# Patient Record
Sex: Female | Born: 1975 | Race: White | Hispanic: No | Marital: Married | Smoking: Never smoker
Health system: Southern US, Community
[De-identification: ages and names within clinical notes are randomized; demographics above are authoritative.]

## PROBLEM LIST (undated history)

## (undated) DIAGNOSIS — Z8632 Personal history of gestational diabetes: Secondary | ICD-10-CM

## (undated) DIAGNOSIS — E119 Type 2 diabetes mellitus without complications: Secondary | ICD-10-CM

## (undated) DIAGNOSIS — Z9221 Personal history of antineoplastic chemotherapy: Secondary | ICD-10-CM

## (undated) DIAGNOSIS — D649 Anemia, unspecified: Secondary | ICD-10-CM

## (undated) DIAGNOSIS — Z923 Personal history of irradiation: Secondary | ICD-10-CM

## (undated) DIAGNOSIS — G709 Myoneural disorder, unspecified: Secondary | ICD-10-CM

## (undated) DIAGNOSIS — Z8489 Family history of other specified conditions: Secondary | ICD-10-CM

## (undated) HISTORY — PX: WISDOM TOOTH EXTRACTION: SHX21

## (undated) HISTORY — DX: Type 2 diabetes mellitus without complications: E11.9

## (undated) HISTORY — DX: Personal history of gestational diabetes: Z86.32

---

## 2001-06-07 HISTORY — PX: DILATION AND CURETTAGE OF UTERUS: SHX78

## 2003-07-07 DIAGNOSIS — O24419 Gestational diabetes mellitus in pregnancy, unspecified control: Secondary | ICD-10-CM

## 2006-05-11 ENCOUNTER — Ambulatory Visit: Payer: Self-pay

## 2006-08-17 ENCOUNTER — Ambulatory Visit: Payer: Self-pay

## 2006-08-23 ENCOUNTER — Ambulatory Visit: Payer: Self-pay

## 2006-09-15 ENCOUNTER — Encounter: Payer: Self-pay | Admitting: Maternal & Fetal Medicine

## 2006-10-09 ENCOUNTER — Inpatient Hospital Stay: Payer: Self-pay

## 2006-10-09 DIAGNOSIS — O24415 Gestational diabetes mellitus in pregnancy, controlled by oral hypoglycemic drugs: Secondary | ICD-10-CM

## 2017-09-26 ENCOUNTER — Encounter: Payer: Self-pay | Admitting: Certified Nurse Midwife

## 2017-09-26 ENCOUNTER — Ambulatory Visit (INDEPENDENT_AMBULATORY_CARE_PROVIDER_SITE_OTHER): Payer: BLUE CROSS/BLUE SHIELD | Admitting: Certified Nurse Midwife

## 2017-09-26 VITALS — BP 104/72 | HR 88 | Ht 66.0 in | Wt 235.0 lb

## 2017-09-26 DIAGNOSIS — Z124 Encounter for screening for malignant neoplasm of cervix: Secondary | ICD-10-CM

## 2017-09-26 DIAGNOSIS — N920 Excessive and frequent menstruation with regular cycle: Secondary | ICD-10-CM | POA: Diagnosis not present

## 2017-09-26 DIAGNOSIS — Z13220 Encounter for screening for lipoid disorders: Secondary | ICD-10-CM

## 2017-09-26 DIAGNOSIS — Z01419 Encounter for gynecological examination (general) (routine) without abnormal findings: Secondary | ICD-10-CM | POA: Diagnosis not present

## 2017-09-26 DIAGNOSIS — Z6837 Body mass index (BMI) 37.0-37.9, adult: Secondary | ICD-10-CM | POA: Diagnosis not present

## 2017-09-26 DIAGNOSIS — Z131 Encounter for screening for diabetes mellitus: Secondary | ICD-10-CM

## 2017-09-26 DIAGNOSIS — N898 Other specified noninflammatory disorders of vagina: Secondary | ICD-10-CM | POA: Diagnosis not present

## 2017-09-26 DIAGNOSIS — Z1239 Encounter for other screening for malignant neoplasm of breast: Secondary | ICD-10-CM

## 2017-09-26 DIAGNOSIS — Z8632 Personal history of gestational diabetes: Secondary | ICD-10-CM | POA: Diagnosis not present

## 2017-09-26 DIAGNOSIS — Z1231 Encounter for screening mammogram for malignant neoplasm of breast: Secondary | ICD-10-CM

## 2017-09-26 NOTE — Progress Notes (Signed)
Gynecology Annual Exam  PCP: Patient, No Pcp Per  Chief Complaint:  Chief Complaint  Patient presents with  . Gynecologic Exam    History of Present Illness:Marilyn Page is a 42 year old Caucasian/White female, [place], who presents for her routine gyn exam. She is having concerns with 1) vaginal discharge that is clear with a possible odor and no itching, 2) one heavy day of bleeding with her menses, at times requiring pad and tampon change every 1 hour Her menses are regular and her LMP was 4/15-4/21. They occur every month and last 5-7 days.  She has had no spotting.  She reports dysmenorrhea, and Advil x 1 dose usually helps. The patient's past medical history is notable for a history of PCOS, gestational diabetes, and obesity.  Since her last annual GYN exam dated 05/15/2014, she complains of difficulty losing weight.    She is sexually active. She is currently using a vasectomy for contraception.  Her most recent pap smear was obtained 12/28/2013 and was negative.  Her most recent mammogram obtained on 11/15/2011 was benign and was her baseline mammogram..  There is no family history of breast cancer.  There is a family history of ovarian cancer in her cousin. Genetic testing has not been done.  The patient does do occasional self breast exams.  The patient does not smoke.  The patient does not drink alcohol.  The patient does not use illegal drugs.  The patient exercises occasionally.  The patient does get adequate calcium in her diet.  She has not had a recent cholesterol screen and is interested in labwork.    Review of Systems: Review of Systems  Constitutional: Negative for chills, fever and weight loss.  HENT: Positive for congestion (nasal congestion). Negative for sinus pain and sore throat.   Eyes: Positive for blurred vision (change in vision). Negative for pain.  Respiratory: Positive for cough (and allergies). Negative for hemoptysis, shortness of breath  and wheezing.   Cardiovascular: Negative for chest pain, palpitations and leg swelling.  Gastrointestinal: Negative for abdominal pain, blood in stool, diarrhea, heartburn, nausea and vomiting.  Genitourinary: Negative for dysuria, frequency, hematuria and urgency.       Positive for vaginal discharge with odor  Musculoskeletal: Negative for back pain, joint pain and myalgias.  Skin: Negative for itching and rash.  Neurological: Positive for headaches. Negative for dizziness and tingling.  Endo/Heme/Allergies: Negative for environmental allergies and polydipsia. Does not bruise/bleed easily.       Negative for hirsutism   Psychiatric/Behavioral: Negative for depression. The patient is not nervous/anxious and does not have insomnia.     Past Medical History:  Past Medical History:  Diagnosis Date  . History of gestational diabetes     Past Surgical History:  Past Surgical History:  Procedure Laterality Date  . CESAREAN SECTION  2005/2008  . DILATION AND CURETTAGE OF UTERUS  2003    Family History:  Family History  Problem Relation Age of Onset  . Ovarian cysts Mother   . Migraines Mother   . Hyperlipidemia Father   . Diabetes Maternal Aunt   . Diabetes Maternal Grandfather   . Ovarian cancer Cousin 20       maternal 1st cousin-now in her 77s    Social History:  Social History   Socioeconomic History  . Marital status: Married    Spouse name: Not on file  . Number of children: 2  . Years of education: Not  on file  . Highest education level: Not on file  Occupational History  . Not on file  Social Needs  . Financial resource strain: Not on file  . Food insecurity:    Worry: Not on file    Inability: Not on file  . Transportation needs:    Medical: Not on file    Non-medical: Not on file  Tobacco Use  . Smoking status: Never Smoker  . Smokeless tobacco: Never Used  Substance and Sexual Activity  . Alcohol use: Never    Frequency: Never  . Drug use: Never    . Sexual activity: Yes    Birth control/protection: Other-see comments    Comment: vasectomy  Lifestyle  . Physical activity:    Days per week: 0 days    Minutes per session: 0 min  . Stress: Not on file  Relationships  . Social connections:    Talks on phone: Not on file    Gets together: Not on file    Attends religious service: Not on file    Active member of club or organization: Not on file    Attends meetings of clubs or organizations: Not on file    Relationship status: Not on file  . Intimate partner violence:    Fear of current or ex partner: Not on file    Emotionally abused: Not on file    Physically abused: Not on file    Forced sexual activity: Not on file  Other Topics Concern  . Not on file  Social History Narrative  . Not on file    Allergies:  No Known Allergies  Medications: none Physical Exam Vitals: BP 104/72   Pulse 88   Ht 5\' 6"  (1.676 m)   Wt 235 lb (106.6 kg)   LMP 09/19/2017 (Exact Date)   BMI 37.93 kg/m   General: NAD HEENT: normocephalic, anicteric Neck: no thyroid enlargement, no palpable nodules, no cervical lymphadenopathy  Pulmonary: No increased work of breathing, CTAB Cardiovascular: RRR, without murmur  Breast: Breast symmetrical, no tenderness, no palpable nodules or masses, no skin or nipple retraction present, no nipple discharge.  No axillary, infraclavicular or supraclavicular lymphadenopathy. Abdomen: Soft, non-tender, non-distended.  Umbilicus without lesions.  No hepatomegaly or masses palpable. No evidence of hernia. Genitourinary:  External: Normal external female genitalia.  Normal urethral meatus, normal Bartholin's and Skene's glands.    Vagina: Normal vaginal mucosa, no evidence of prolapse.    Cervix: Grossly normal in appearance, no bleeding, non-tender  Uterus: Anteverted, slightly irregular contour on right fundal area, mobile, and non-tender  Adnexa: No adnexal masses, non-tender  Rectal: deferred  Lymphatic:  no evidence of inguinal lymphadenopathy Extremities: no edema, erythema, or tenderness Neurologic: Grossly intact Psychiatric: mood appropriate, affect full   Wet prep: negative for hyphae, Trich, clue cells and amine odor.  Assessment: 42 y.o.  Well woman exam No vaginal infection Desires weight loss. BMI=37.93 kg/m2 Irregularly shaped uterus with heavier bleeding x 1 day  Plan:    1) Breast cancer screening - recommend monthly self breast exam and annual mammograms. Mammogram was ordered today.  2) Discussed weight loss-recommend following low glycemic index diet and encouraged increased exercise. Offered appointment with MD to discuss medical treatment for weight loss.  3) Cervical cancer screening - Pap was done.  4) Routine healthcare maintenance labs including cholesterol and diabetes screening, TSH, CBC, and CMP ordered today  5) Pelvic ultrasound and folow up with me.  Dalia Heading, CNM

## 2017-09-27 LAB — HEMOGLOBIN A1C
ESTIMATED AVERAGE GLUCOSE: 123 mg/dL
Hgb A1c MFr Bld: 5.9 % — ABNORMAL HIGH (ref 4.8–5.6)

## 2017-09-27 LAB — CBC WITH DIFFERENTIAL/PLATELET
BASOS: 1 %
Basophils Absolute: 0.1 10*3/uL (ref 0.0–0.2)
EOS (ABSOLUTE): 0.4 10*3/uL (ref 0.0–0.4)
EOS: 4 %
HEMATOCRIT: 37.7 % (ref 34.0–46.6)
HEMOGLOBIN: 12.4 g/dL (ref 11.1–15.9)
Immature Grans (Abs): 0 10*3/uL (ref 0.0–0.1)
Immature Granulocytes: 0 %
LYMPHS ABS: 2.2 10*3/uL (ref 0.7–3.1)
Lymphs: 26 %
MCH: 27.9 pg (ref 26.6–33.0)
MCHC: 32.9 g/dL (ref 31.5–35.7)
MCV: 85 fL (ref 79–97)
MONOCYTES: 8 %
Monocytes Absolute: 0.6 10*3/uL (ref 0.1–0.9)
NEUTROS ABS: 5.3 10*3/uL (ref 1.4–7.0)
Neutrophils: 61 %
Platelets: 280 10*3/uL (ref 150–379)
RBC: 4.45 x10E6/uL (ref 3.77–5.28)
RDW: 14 % (ref 12.3–15.4)
WBC: 8.6 10*3/uL (ref 3.4–10.8)

## 2017-09-27 LAB — COMPREHENSIVE METABOLIC PANEL
A/G RATIO: 1.3 (ref 1.2–2.2)
ALT: 23 IU/L (ref 0–32)
AST: 18 IU/L (ref 0–40)
Albumin: 3.9 g/dL (ref 3.5–5.5)
Alkaline Phosphatase: 79 IU/L (ref 39–117)
BUN/Creatinine Ratio: 12 (ref 9–23)
BUN: 9 mg/dL (ref 6–24)
CHLORIDE: 102 mmol/L (ref 96–106)
CO2: 24 mmol/L (ref 20–29)
Calcium: 9.2 mg/dL (ref 8.7–10.2)
Creatinine, Ser: 0.74 mg/dL (ref 0.57–1.00)
GFR calc non Af Amer: 100 mL/min/{1.73_m2} (ref >59)
GFR, EST AFRICAN AMERICAN: 116 mL/min/{1.73_m2} (ref >59)
Globulin, Total: 2.9 g/dL (ref 1.5–4.5)
Glucose: 89 mg/dL (ref 65–99)
POTASSIUM: 4.1 mmol/L (ref 3.5–5.2)
Sodium: 143 mmol/L (ref 134–144)
TOTAL PROTEIN: 6.8 g/dL (ref 6.0–8.5)

## 2017-09-27 LAB — LIPID PANEL
CHOLESTEROL TOTAL: 145 mg/dL (ref 100–199)
Chol/HDL Ratio: 5.2 ratio — ABNORMAL HIGH (ref 0.0–4.4)
HDL: 28 mg/dL — ABNORMAL LOW (ref >39)
LDL CALC: 88 mg/dL (ref 0–99)
Triglycerides: 143 mg/dL (ref 0–149)
VLDL CHOLESTEROL CAL: 29 mg/dL (ref 5–40)

## 2017-09-27 LAB — TSH: TSH: 1.74 u[IU]/mL (ref 0.450–4.500)

## 2017-09-28 LAB — IGP, APTIMA HPV
HPV Aptima: NEGATIVE
PAP SMEAR COMMENT: 0

## 2017-10-03 ENCOUNTER — Telehealth: Payer: Self-pay | Admitting: Certified Nurse Midwife

## 2017-10-03 ENCOUNTER — Encounter: Payer: Self-pay | Admitting: Certified Nurse Midwife

## 2017-10-03 DIAGNOSIS — Z6837 Body mass index (BMI) 37.0-37.9, adult: Secondary | ICD-10-CM | POA: Insufficient documentation

## 2017-10-03 DIAGNOSIS — Z8632 Personal history of gestational diabetes: Secondary | ICD-10-CM | POA: Insufficient documentation

## 2017-10-03 LAB — POCT WET PREP (WET MOUNT): Trichomonas Wet Prep HPF POC: ABSENT

## 2017-10-03 NOTE — Telephone Encounter (Signed)
Called patient with lab results and Pap smear results: CMP, CBC, and TSH all normal. Low HDL noted on otherwise normal lipid panel. Hemoglobin A1C was 5.9%. Pap was ASCUS with negative HRHPV. Encouraged to continue with plans to exercise and lose weight. Repeat Pap smear in 1 year. Repeat hemoglobin A1C and lipid panel in 6-12 months. Dalia Heading, CNM

## 2017-10-14 ENCOUNTER — Ambulatory Visit (INDEPENDENT_AMBULATORY_CARE_PROVIDER_SITE_OTHER): Payer: BLUE CROSS/BLUE SHIELD

## 2017-10-14 ENCOUNTER — Ambulatory Visit (INDEPENDENT_AMBULATORY_CARE_PROVIDER_SITE_OTHER): Payer: BLUE CROSS/BLUE SHIELD | Admitting: Certified Nurse Midwife

## 2017-10-14 ENCOUNTER — Encounter: Payer: Self-pay | Admitting: Certified Nurse Midwife

## 2017-10-14 VITALS — BP 102/72 | HR 71 | Ht 66.0 in | Wt 236.0 lb

## 2017-10-14 DIAGNOSIS — D259 Leiomyoma of uterus, unspecified: Secondary | ICD-10-CM | POA: Diagnosis not present

## 2017-10-14 DIAGNOSIS — N920 Excessive and frequent menstruation with regular cycle: Secondary | ICD-10-CM

## 2017-10-14 NOTE — Patient Instructions (Signed)
Uterine Fibroids Uterine fibroids are tissue masses (tumors) that can develop in the womb (uterus). They are also called leiomyomas. This type of tumor is not cancerous (benign) and does not spread to other parts of the body outside of the pelvic area, which is between the hip bones. Occasionally, fibroids may develop in the fallopian tubes, in the cervix, or on the support structures (ligaments) that surround the uterus. You can have one or many fibroids. Fibroids can vary in size, weight, and where they grow in the uterus. Some can become quite large. Most fibroids do not require medical treatment. What are the causes? A fibroid can develop when a single uterine cell keeps growing (replicating). Most cells in the human body have a control mechanism that keeps them from replicating without control. What are the signs or symptoms? Symptoms may include:  Heavy bleeding during your period.  Bleeding or spotting between periods.  Pelvic pain and pressure.  Bladder problems, such as needing to urinate more often (urinary frequency) or urgently.  Inability to reproduce offspring (infertility).  Miscarriages.  How is this diagnosed? Uterine fibroids are diagnosed through a physical exam. Your health care provider may feel the lumpy tumors during a pelvic exam. Ultrasonography and an MRI may be done to determine the size, location, and number of fibroids. How is this treated? Treatment may include:  Watchful waiting. This involves getting the fibroid checked by your health care provider to see if it grows or shrinks. Follow your health care provider's recommendations for how often to have this checked.  Hormone medicines. These can be taken by mouth or given through an intrauterine device (IUD).  Surgery. ? Removing the fibroids (myomectomy) or the uterus (hysterectomy). ? Removing blood supply to the fibroids (uterine artery embolization).  If fibroids interfere with your fertility and you  want to become pregnant, your health care provider may recommend having the fibroids removed. Follow these instructions at home:  Keep all follow-up visits as directed by your health care provider. This is important.  Take over-the-counter and prescription medicines only as told by your health care provider. ? If you were prescribed a hormone treatment, take the hormone medicines exactly as directed.  Ask your health care provider about taking iron pills and increasing the amount of dark green, leafy vegetables in your diet. These actions can help to boost your blood iron levels, which may be affected by heavy menstrual bleeding.  Pay close attention to your period and tell your health care provider about any changes, such as: ? Increased blood flow that requires you to use more pads or tampons than usual per month. ? A change in the number of days that your period lasts per month. ? A change in symptoms that are associated with your period, such as abdominal cramping or back pain. Contact a health care provider if:  You have pelvic pain, back pain, or abdominal cramps that cannot be controlled with medicines.  You have an increase in bleeding between and during periods.  You soak tampons or pads in a half hour or less.  You feel lightheaded, extra tired, or weak. Get help right away if:  You faint.  You have a sudden increase in pelvic pain. This information is not intended to replace advice given to you by your health care provider. Make sure you discuss any questions you have with your health care provider. Document Released: 05/21/2000 Document Revised: 01/22/2016 Document Reviewed: 11/20/2013 Elsevier Interactive Patient Education  2018 [employer]  

## 2017-10-14 NOTE — Progress Notes (Signed)
  Marilyn Page is a 42 year old Caucasian/White female, [place], who presents for a follow up visit after a pelvic ultrasound. At her recent annual exam she had an irregularly shaped uterus and expressed concerns regarding her heavy menstrual flow. She has one heavy day of bleeding with her menses, at times requiring pad and tampon change every 1 hour Her menses are regular and her LMP was 4/15-4/21. They occur every month and last 5-7 days.     Ultrasound demonstrates a hyperechoic area on the posterior wall measuring 3 x 3cm which appears to be a fibroid and there is a question as to whether it encroaches on the endometrium (pending physician's reading). The uterus measures 11.75 x 6.3 x 6,68 cm. There is a 3 cm simple cyst on the right ovary and the endometrium is 16.77mm (menses due next week) PMHx: She  has a past medical history of History of gestational diabetes. Also,  has a past surgical history that includes Cesarean section (2005/2008) and Dilation and curettage of uterus (2003)., family history includes Diabetes in her maternal aunt and maternal grandfather; Hyperlipidemia in her father; Migraines in her mother; Ovarian cancer (age of onset: 59) in her cousin; Ovarian cysts in her mother.,  reports that she has never smoked. She has never used smokeless tobacco. She reports that she does not drink alcohol or use drugs.  She currently has no medications in their medication list. Also, has No Known Allergies.  ROS  Objective: BP 102/72   Pulse 71   Wt 236 lb (107 kg)   LMP 09/19/2017 (Exact Date)   BMI 38.09 kg/m   Physical examination Constitutional NAD, Conversant      Extremities: Moves all appropriately.  Normal ROM for age  Neuro: Grossly intact  Psych: Oriented to PPT.  Normal mood. Normal affect.   Assessment:  Uterine leiomyoma, unspecified location   PLan: Discussed findings and limitations of ultrasound. Explained that usually fibroids are benign, but that  fibroids can cause increased bleeding if they are submucosal. If desires further evaluation/ treatment, would recommend a SIS and follow up with MD Patient to discuss with husband and let me know if she decides to evaluate further.  Written information on fibroids given to her.  Dalia Heading, CNM

## 2017-10-19 NOTE — Progress Notes (Signed)
This is the patient that probably has a fibroid rather than "adenomyosis" per Dr Georgianne Fick.

## 2017-11-09 ENCOUNTER — Ambulatory Visit
Admission: RE | Admit: 2017-11-09 | Discharge: 2017-11-09 | Disposition: A | Discharge status: Home / Self Care | Payer: BLUE CROSS/BLUE SHIELD | Source: Ambulatory Visit | Attending: Certified Nurse Midwife | Admitting: Certified Nurse Midwife

## 2017-11-09 DIAGNOSIS — Z1231 Encounter for screening mammogram for malignant neoplasm of breast: Secondary | ICD-10-CM | POA: Insufficient documentation

## 2017-11-09 DIAGNOSIS — Z1239 Encounter for other screening for malignant neoplasm of breast: Secondary | ICD-10-CM

## 2017-11-18 ENCOUNTER — Other Ambulatory Visit: Payer: Self-pay | Admitting: *Deleted

## 2017-11-18 ENCOUNTER — Inpatient Hospital Stay
Admission: RE | Admit: 2017-11-18 | Discharge: 2017-11-18 | Disposition: A | Discharge status: Home / Self Care | Payer: Self-pay | Source: Ambulatory Visit | Attending: *Deleted | Admitting: *Deleted

## 2017-11-18 DIAGNOSIS — Z9289 Personal history of other medical treatment: Secondary | ICD-10-CM

## 2018-05-07 ENCOUNTER — Ambulatory Visit
Admission: EM | Admit: 2018-05-07 | Discharge: 2018-05-07 | Disposition: A | Discharge status: Home / Self Care | Payer: BLUE CROSS/BLUE SHIELD | Attending: Family Medicine | Admitting: Family Medicine

## 2018-05-07 ENCOUNTER — Other Ambulatory Visit: Payer: Self-pay

## 2018-05-07 DIAGNOSIS — J069 Acute upper respiratory infection, unspecified: Secondary | ICD-10-CM

## 2018-05-07 LAB — RAPID STREP SCREEN (MED CTR MEBANE ONLY): Streptococcus, Group A Screen (Direct): NEGATIVE

## 2018-05-07 MED ORDER — IPRATROPIUM BROMIDE 0.06 % NA SOLN: 2.0000 | Freq: Four times a day (QID) | NASAL | 0 refills | Status: DC | PRN

## 2018-05-07 MED ORDER — CETIRIZINE-PSEUDOEPHEDRINE ER 5-120 MG PO TB12: 1.0000 | ORAL_TABLET | Freq: Two times a day (BID) | ORAL | 0 refills | Status: DC

## 2018-05-07 NOTE — ED Provider Notes (Signed)
[facility]    CSN: [id number] Arrival date & time: 05/07/18  1407  History   Chief Complaint Chief Complaint  Patient presents with  . Nasal Congestion  . Sore Throat   HPI  42 year old female presents with upper respiratory symptoms.  Symptoms started today.  Patient states that she awoke with congestion and severe sore throat.  Her pain is currently 5/10 in severity.  She has taken Tylenol with no relief.  No other medication or interventions tried.  No known exacerbating or relieving factors.  No fever. No reported sick contacts.  No other associated symptoms.  No other complaints.   PMH, Surgical Hx, Family Hx, Social History reviewed and updated as below.  Past Medical History:  Diagnosis Date  . History of gestational diabetes    Patient Active Problem List   Diagnosis Date Noted  . Menorrhagia with regular cycle 10/14/2017  . BMI 37.0-37.9, adult 10/03/2017  . History of gestational diabetes 10/03/2017   Past Surgical History:  Procedure Laterality Date  . CESAREAN SECTION  2005/2008  . DILATION AND CURETTAGE OF UTERUS  2003    OB History    Gravida  3   Para  2   Term  2   Preterm      AB  1   Living  2     SAB  1   TAB      Ectopic      Multiple      Live Births  2            Home Medications    Prior to Admission medications   Medication Sig Start Date End Date Taking? Authorizing Provider  cetirizine-pseudoephedrine (ZYRTEC-D) 5-120 MG tablet Take 1 tablet by mouth 2 (two) times daily. 05/07/18   Thersa Salt G, DO  ipratropium (ATROVENT) 0.06 % nasal spray Place 2 sprays into both nostrils 4 (four) times daily as needed for rhinitis. 05/07/18   Coral Spikes, DO    Family History Family History  Problem Relation Age of Onset  . Ovarian cysts Mother   . Migraines Mother   . Hyperlipidemia Father   . Diabetes Maternal Aunt   . Diabetes Maternal Grandfather   . Ovarian cancer Cousin 20       maternal 1st cousin-now  in her 44s  . Breast cancer Neg Hx     Social History Social History   Tobacco Use  . Smoking status: Never Smoker  . Smokeless tobacco: Never Used  Substance Use Topics  . Alcohol use: Never    Frequency: Never  . Drug use: Never    Allergies   Patient has no known allergies.   Review of Systems Review of Systems  Constitutional: Negative for fever.  HENT: Positive for congestion and sore throat.    Physical Exam Triage Vital Signs ED Triage Vitals [05/07/18 1445]  Enc Vitals Group     BP (!) 143/68     Pulse Rate 91     Resp 16     Temp 97.8 F (36.6 C)     Temp Source Oral     SpO2 100 %     Weight 236 lb (107 kg)     Height 5\' 6"  (1.676 m)     Head Circumference      Peak Flow      Pain Score 5     Pain Loc      Pain Edu?      Excl. in  GC?    No data found.  Updated Vital Signs BP (!) 143/68 (BP Location: Left Arm)   Pulse 91   Temp 97.8 F (36.6 C) (Oral)   Resp 16   Ht 5\' 6"  (1.676 m)   Wt 107 kg   LMP 04/30/2018   SpO2 100%   BMI 38.09 kg/m   Visual Acuity Right Eye Distance:   Left Eye Distance:   Bilateral Distance:    Right Eye Near:   Left Eye Near:    Bilateral Near:     Physical Exam  Constitutional: She is oriented to person, place, and time. She appears well-developed. No distress.  HENT:  Head: Normocephalic and atraumatic.  Oropharynx with erythema.   TMs with scarring but otherwise normal.  Eyes: Conjunctivae are normal.  Cardiovascular: Normal rate and regular rhythm.  Pulmonary/Chest: Effort normal and breath sounds normal. She has no wheezes. She has no rales.  Neurological: She is alert and oriented to person, place, and time.  Psychiatric: She has a normal mood and affect. Her behavior is normal.  Nursing note and vitals reviewed.  UC Treatments / Results  Labs (all labs ordered are listed, but only abnormal results are displayed) Labs Reviewed  RAPID STREP SCREEN ([facility] ONLY)  CULTURE, GROUP A  STREP [facility])    EKG None  Radiology No results found.  Procedures Procedures (including critical care time)  Medications Ordered in UC Medications - No data to display  Initial Impression / Assessment and Plan / UC Course  I have reviewed the triage vital signs and the nursing notes.  Pertinent labs & imaging results that were available during my care of the patient were reviewed by me and considered in my medical decision making (see chart for details).    42 year old female presents with a viral URI.  Strep negative.  Treating with Atrovent nasal spray, Zyrtec-D.  Supportive care.  Final Clinical Impressions(s) / UC Diagnoses   Final diagnoses:  Upper respiratory infection with cough and congestion     Discharge Instructions     Rest. Fluids  Ibuprofen 800 mg every 8 hours as needed for pain.  Medication as prescribed.   Take care  Dr. Lacinda Axon    ED Prescriptions    Medication Sig Dispense Auth. Provider   ipratropium (ATROVENT) 0.06 % nasal spray Place 2 sprays into both nostrils 4 (four) times daily as needed for rhinitis. 15 mL Seba Madole G, DO   cetirizine-pseudoephedrine (ZYRTEC-D) 5-120 MG tablet Take 1 tablet by mouth 2 (two) times daily. 30 tablet Coral Spikes, DO     Controlled Substance Prescriptions [place] Controlled Substance Registry consulted? Not Applicable   Coral Spikes, DO 05/07/18 1523

## 2018-05-07 NOTE — ED Triage Notes (Signed)
Pt woke up today with a lot of congestion and a sore throat. Pain 5/10

## 2018-05-07 NOTE — Discharge Instructions (Signed)
Rest. Fluids  Ibuprofen 800 mg every 8 hours as needed for pain.  Medication as prescribed.   Take care  Dr. Lacinda Axon

## 2018-05-10 LAB — CULTURE, GROUP A STREP (THRC)

## 2018-05-22 ENCOUNTER — Other Ambulatory Visit: Payer: Self-pay

## 2018-05-22 ENCOUNTER — Ambulatory Visit
Admission: EM | Admit: 2018-05-22 | Discharge: 2018-05-22 | Disposition: A | Discharge status: Home / Self Care | Payer: BLUE CROSS/BLUE SHIELD | Attending: Family Medicine | Admitting: Family Medicine

## 2018-05-22 DIAGNOSIS — J0100 Acute maxillary sinusitis, unspecified: Secondary | ICD-10-CM | POA: Diagnosis not present

## 2018-05-22 DIAGNOSIS — R05 Cough: Secondary | ICD-10-CM | POA: Diagnosis not present

## 2018-05-22 DIAGNOSIS — R059 Cough, unspecified: Secondary | ICD-10-CM

## 2018-05-22 MED ORDER — AMOXICILLIN 875 MG PO TABS: 875.0000 mg | ORAL_TABLET | Freq: Two times a day (BID) | ORAL | 0 refills | Status: DC

## 2018-05-22 MED ORDER — HYDROCOD POLST-CPM POLST ER 10-8 MG/5ML PO SUER: 5.0000 mL | Freq: Two times a day (BID) | ORAL | 0 refills | Status: DC | PRN

## 2018-05-22 NOTE — ED Provider Notes (Signed)
[facility]    CSN: [id number] Arrival date & time: 05/22/18  1327     History   Chief Complaint Chief Complaint  Patient presents with  . Cough    HPI Marilyn Page is a 42 y.o. female.   The history is provided by the patient.  Cough  Associated symptoms: headaches   Associated symptoms: no wheezing   URI  Presenting symptoms: congestion, cough, facial pain and fatigue   Severity:  Moderate Onset quality:  Sudden Duration:  2 weeks Timing:  Constant Progression:  Worsening Chronicity:  New Relieved by:  Nothing Ineffective treatments:  OTC medications and prescription medications Associated symptoms: headaches and sinus pain   Associated symptoms: no wheezing   Risk factors: sick contacts     Past Medical History:  Diagnosis Date  . History of gestational diabetes     Patient Active Problem List   Diagnosis Date Noted  . Menorrhagia with regular cycle 10/14/2017  . BMI 37.0-37.9, adult 10/03/2017  . History of gestational diabetes 10/03/2017    Past Surgical History:  Procedure Laterality Date  . CESAREAN SECTION  2005/2008  . DILATION AND CURETTAGE OF UTERUS  2003    OB History    Gravida  3   Para  2   Term  2   Preterm      AB  1   Living  2     SAB  1   TAB      Ectopic      Multiple      Live Births  2            Home Medications    Prior to Admission medications   Medication Sig Start Date End Date Taking? Authorizing Provider  amoxicillin (AMOXIL) 875 MG tablet Take 1 tablet (875 mg total) by mouth 2 (two) times daily. 05/22/18   Norval Gable, MD  cetirizine-pseudoephedrine (ZYRTEC-D) 5-120 MG tablet Take 1 tablet by mouth 2 (two) times daily. 05/07/18   Coral Spikes, DO  chlorpheniramine-HYDROcodone (TUSSIONEX PENNKINETIC ER) 10-8 MG/5ML SUER Take 5 mLs by mouth every 12 (twelve) hours as needed. 05/22/18   Norval Gable, MD  ipratropium (ATROVENT) 0.06 % nasal spray Place 2 sprays into both  nostrils 4 (four) times daily as needed for rhinitis. 05/07/18   Coral Spikes, DO    Family History Family History  Problem Relation Age of Onset  . Ovarian cysts Mother   . Migraines Mother   . Hyperlipidemia Father   . Diabetes Maternal Aunt   . Diabetes Maternal Grandfather   . Ovarian cancer Cousin 20       maternal 1st cousin-now in her 8s  . Breast cancer Neg Hx     Social History Social History   Tobacco Use  . Smoking status: Never Smoker  . Smokeless tobacco: Never Used  Substance Use Topics  . Alcohol use: Never    Frequency: Never  . Drug use: Never     Allergies   Patient has no known allergies.   Review of Systems Review of Systems  Constitutional: Positive for fatigue.  HENT: Positive for congestion and sinus pain.   Respiratory: Positive for cough. Negative for wheezing.   Neurological: Positive for headaches.     Physical Exam Triage Vital Signs ED Triage Vitals  Enc Vitals Group     BP 05/22/18 1341 (!) 124/57     Pulse Rate 05/22/18 1341 (!) 115     Resp 05/22/18  1341 18     Temp 05/22/18 1341 99.3 F (37.4 C)     Temp Source 05/22/18 1341 Oral     SpO2 05/22/18 1341 100 %     Weight 05/22/18 1339 236 lb (107 kg)     Height 05/22/18 1339 5\' 6"  (1.676 m)     Head Circumference --      Peak Flow --      Pain Score 05/22/18 1339 8     Pain Loc --      Pain Edu? --      Excl. in [place]? --    No data found.  Updated Vital Signs BP (!) 124/57 (BP Location: Left Arm)   Pulse (!) 115   Temp 99.3 F (37.4 C) (Oral)   Resp 18   Ht 5\' 6"  (1.676 m)   Wt 107 kg   LMP 04/30/2018   SpO2 100%   BMI 38.09 kg/m   Visual Acuity Right Eye Distance:   Left Eye Distance:   Bilateral Distance:    Right Eye Near:   Left Eye Near:    Bilateral Near:     Physical Exam Vitals signs and nursing note reviewed.  Constitutional:      General: She is not in acute distress.    Appearance: She is well-developed. She is not diaphoretic.  HENT:       Head: Normocephalic and atraumatic.     Right Ear: Tympanic membrane, ear canal and external ear normal.     Left Ear: Tympanic membrane, ear canal and external ear normal.     Nose: Mucosal edema and rhinorrhea present. No nasal deformity, septal deviation or laceration.     Right Sinus: Maxillary sinus tenderness and frontal sinus tenderness present.     Left Sinus: Maxillary sinus tenderness and frontal sinus tenderness present.     Mouth/Throat:     Pharynx: Uvula midline. No oropharyngeal exudate.  Eyes:     General: No scleral icterus.       Right eye: No discharge.        Left eye: No discharge.     Conjunctiva/sclera: Conjunctivae normal.     Pupils: Pupils are equal, round, and reactive to light.  Neck:     Musculoskeletal: Normal range of motion and neck supple.     Thyroid: No thyromegaly.  Cardiovascular:     Rate and Rhythm: Normal rate and regular rhythm.     Heart sounds: Normal heart sounds.  Pulmonary:     Effort: Pulmonary effort is normal. No respiratory distress.     Breath sounds: Normal breath sounds. No wheezing or rales.  Lymphadenopathy:     Cervical: No cervical adenopathy.      UC Treatments / Results  Labs (all labs ordered are listed, but only abnormal results are displayed) Labs Reviewed - No data to display  EKG None  Radiology No results found.  Procedures Procedures (including critical care time)  Medications Ordered in UC Medications - No data to display  Initial Impression / Assessment and Plan / UC Course  I have reviewed the triage vital signs and the nursing notes.  Pertinent labs & imaging results that were available during my care of the patient were reviewed by me and considered in my medical decision making (see chart for details).      Final Clinical Impressions(s) / UC Diagnoses   Final diagnoses:  Acute maxillary sinusitis, recurrence not specified  Cough    ED Prescriptions  Medication Sig Dispense  Auth. Provider   amoxicillin (AMOXIL) 875 MG tablet Take 1 tablet (875 mg total) by mouth 2 (two) times daily. 20 tablet Norval Gable, MD   chlorpheniramine-HYDROcodone (TUSSIONEX PENNKINETIC ER) 10-8 MG/5ML SUER  (Status: Discontinued) Take 5 mLs by mouth every 12 (twelve) hours as needed. 60 mL Norval Gable, MD   chlorpheniramine-HYDROcodone (TUSSIONEX PENNKINETIC ER) 10-8 MG/5ML SUER Take 5 mLs by mouth every 12 (twelve) hours as needed. 60 mL Norval Gable, MD     1. diagnosis reviewed with patient 2. rx as per orders above; reviewed possible side effects, interactions, risks and benefits  3. Recommend supportive treatment with rest, fluids, flonase 4. Follow-up prn if symptoms worsen or don't improve   Controlled Substance Prescriptions [place] Controlled Substance Registry consulted? Not Applicable   Norval Gable, MD 05/22/18 [phone]

## 2018-05-22 NOTE — ED Triage Notes (Signed)
Patient states that she was seen here on 12/1 and given flonase and told that she had a viral illness. Patient states that she has never improved, currently has a cough with wheezing, shortness of breath and fevers. States that she worsened significantly over last 2 days.

## 2020-05-14 DIAGNOSIS — G2581 Restless legs syndrome: Secondary | ICD-10-CM | POA: Diagnosis not present

## 2020-05-14 DIAGNOSIS — Z13220 Encounter for screening for lipoid disorders: Secondary | ICD-10-CM | POA: Diagnosis not present

## 2020-05-14 DIAGNOSIS — N920 Excessive and frequent menstruation with regular cycle: Secondary | ICD-10-CM | POA: Diagnosis not present

## 2020-05-14 DIAGNOSIS — D649 Anemia, unspecified: Secondary | ICD-10-CM | POA: Diagnosis not present

## 2020-05-14 DIAGNOSIS — Z7689 Persons encountering health services in other specified circumstances: Secondary | ICD-10-CM | POA: Diagnosis not present

## 2020-05-14 DIAGNOSIS — Z23 Encounter for immunization: Secondary | ICD-10-CM | POA: Diagnosis not present

## 2020-05-14 DIAGNOSIS — Z1329 Encounter for screening for other suspected endocrine disorder: Secondary | ICD-10-CM | POA: Diagnosis not present

## 2020-05-14 DIAGNOSIS — Z0000 Encounter for general adult medical examination without abnormal findings: Secondary | ICD-10-CM | POA: Diagnosis not present

## 2020-05-14 DIAGNOSIS — Z1231 Encounter for screening mammogram for malignant neoplasm of breast: Secondary | ICD-10-CM | POA: Diagnosis not present

## 2020-05-14 DIAGNOSIS — Z131 Encounter for screening for diabetes mellitus: Secondary | ICD-10-CM | POA: Diagnosis not present

## 2020-05-16 ENCOUNTER — Other Ambulatory Visit: Payer: Self-pay | Admitting: Gerontology

## 2020-05-16 DIAGNOSIS — Z1231 Encounter for screening mammogram for malignant neoplasm of breast: Secondary | ICD-10-CM

## 2020-05-18 DIAGNOSIS — E119 Type 2 diabetes mellitus without complications: Secondary | ICD-10-CM | POA: Insufficient documentation

## 2020-06-16 ENCOUNTER — Ambulatory Visit
Admission: RE | Admit: 2020-06-16 | Discharge: 2020-06-16 | Disposition: A | Discharge status: Home / Self Care | Payer: 59 | Source: Ambulatory Visit | Attending: Gerontology | Admitting: Gerontology

## 2020-06-16 ENCOUNTER — Other Ambulatory Visit: Payer: Self-pay

## 2020-06-16 DIAGNOSIS — Z1231 Encounter for screening mammogram for malignant neoplasm of breast: Secondary | ICD-10-CM | POA: Diagnosis not present

## 2020-06-24 ENCOUNTER — Other Ambulatory Visit: Payer: Self-pay | Admitting: Gerontology

## 2020-06-24 DIAGNOSIS — R928 Other abnormal and inconclusive findings on diagnostic imaging of breast: Secondary | ICD-10-CM

## 2020-06-24 DIAGNOSIS — N6310 Unspecified lump in the right breast, unspecified quadrant: Secondary | ICD-10-CM

## 2020-06-26 ENCOUNTER — Ambulatory Visit
Admission: RE | Admit: 2020-06-26 | Discharge: 2020-06-26 | Disposition: A | Discharge status: Home / Self Care | Payer: 59 | Source: Ambulatory Visit | Attending: Gerontology | Admitting: Gerontology

## 2020-06-26 ENCOUNTER — Other Ambulatory Visit: Payer: Self-pay

## 2020-06-26 DIAGNOSIS — N6310 Unspecified lump in the right breast, unspecified quadrant: Secondary | ICD-10-CM | POA: Diagnosis not present

## 2020-06-26 DIAGNOSIS — R928 Other abnormal and inconclusive findings on diagnostic imaging of breast: Secondary | ICD-10-CM | POA: Insufficient documentation

## 2021-03-07 IMAGING — US US BREAST*R* LIMITED INC AXILLA
1 series · 5 of 5 positions shown · non-contrast
Comparison: Previous exam(s).

CLINICAL DATA: 44-year-old female presenting as a recall from
screening for possible right breast asymmetry.

EXAM:
DIGITAL DIAGNOSTIC RIGHT MAMMOGRAM WITH TOMOSYNTHESIS
ULTRASOUND BREAST RIGHT
TECHNIQUE: Right digital diagnostic mammography and breast tomosynthesis was
performed. Targeted ultrasound examination of the Right breast was
performed.

[Series 1: us breast*right* limited inc axilla · 0.06mm/px · 5 of 5 slices shown]
[im 1/5]
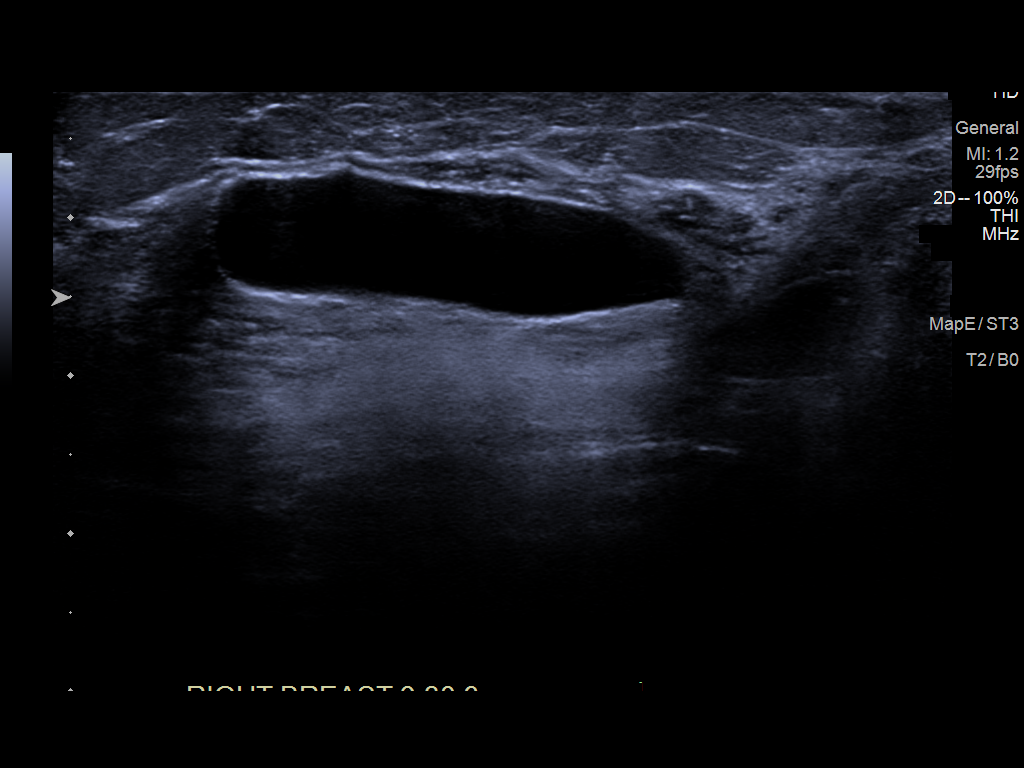
[im 2/5]
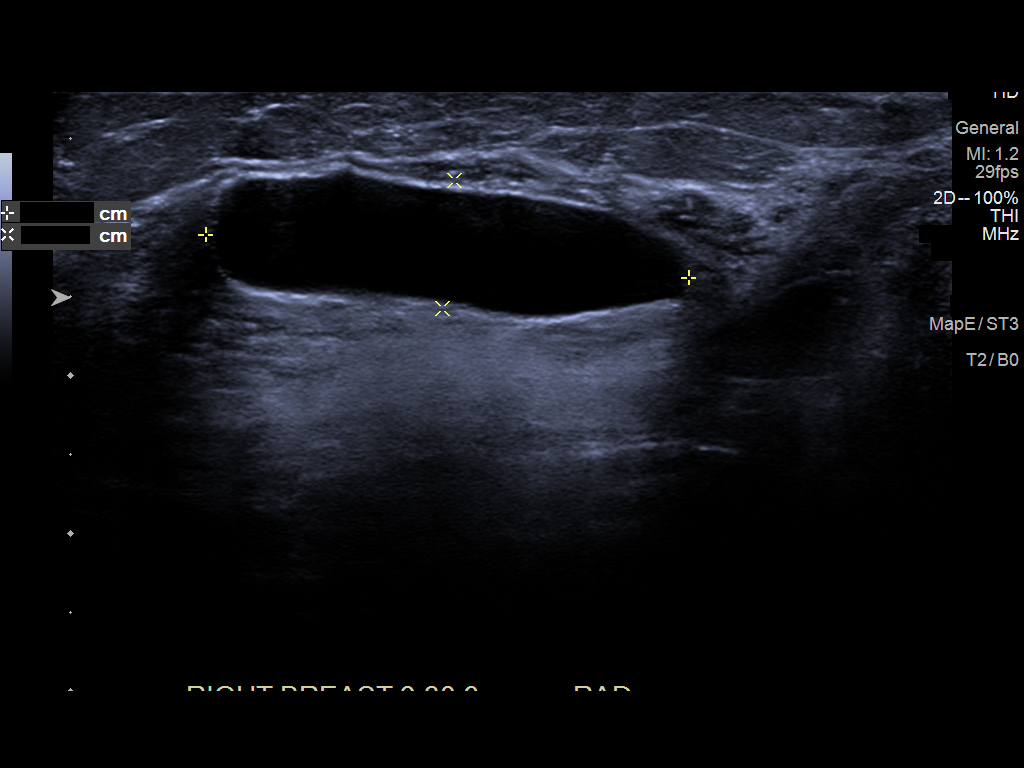
[im 3/5]
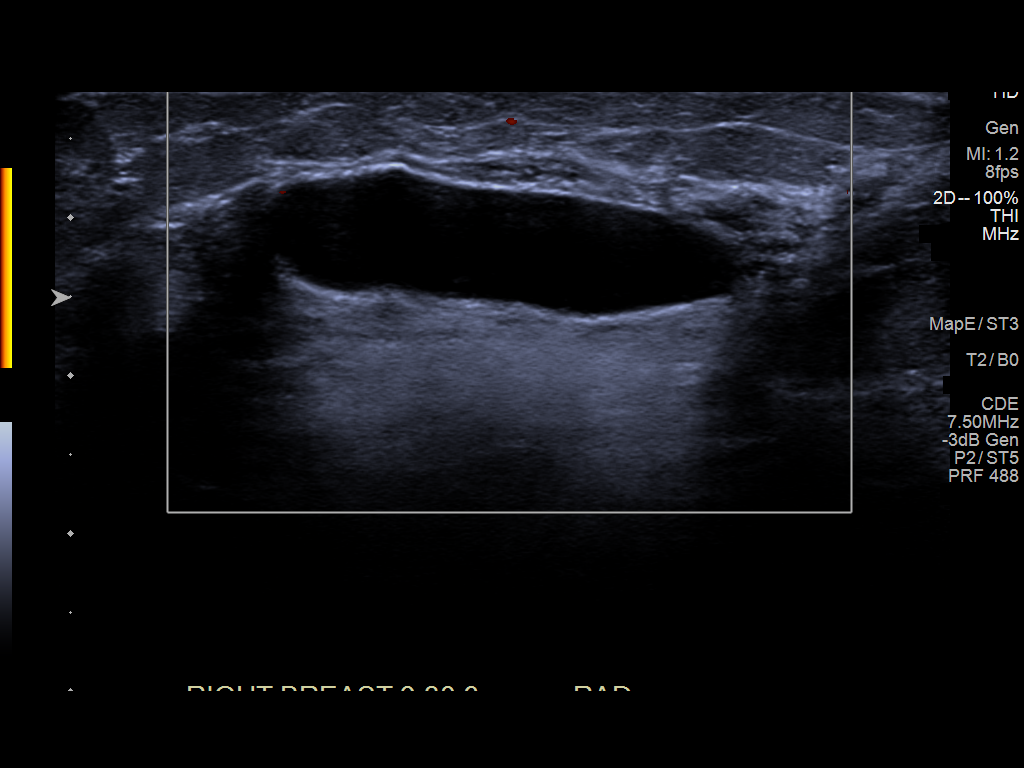
[im 4/5]
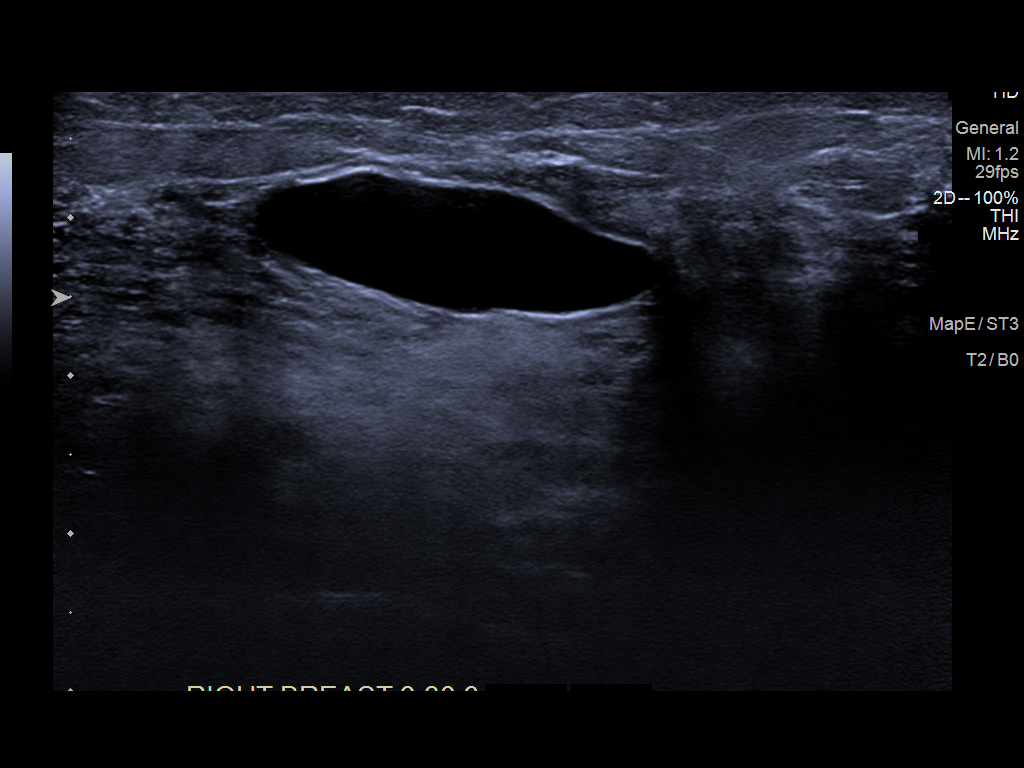
[im 5/5]
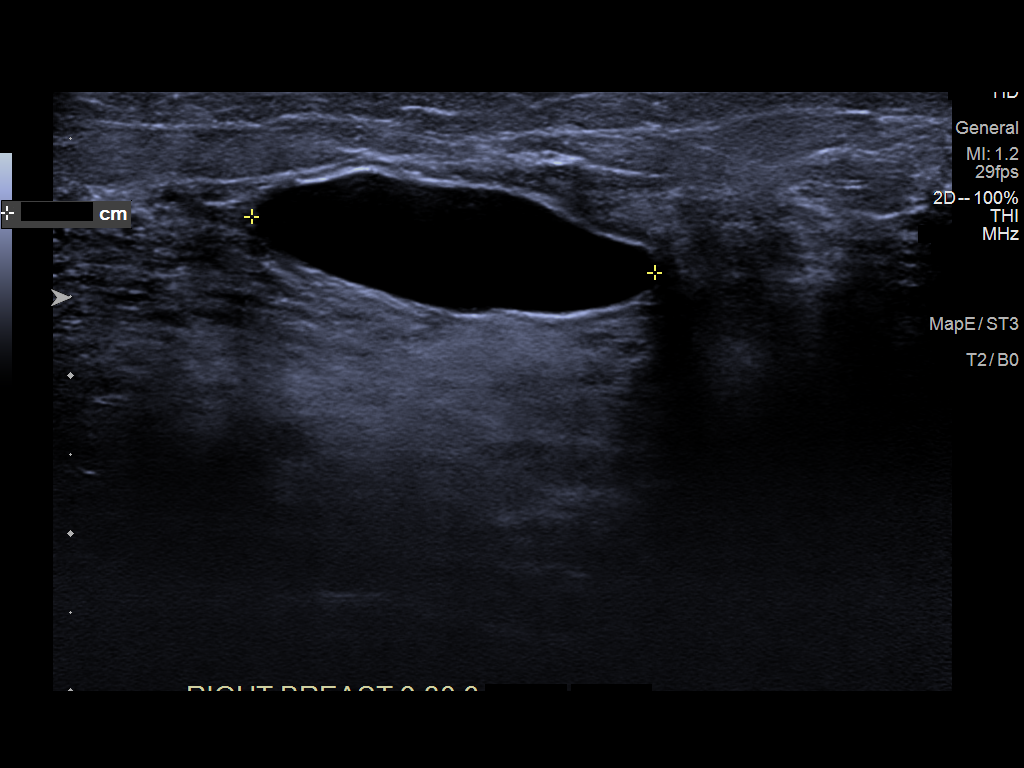

[5 of 5 positions shown; findings below may reference images not displayed]

ACR Breast Density Category c: The breast tissue is heterogeneously
dense, which may obscure small masses.
FINDINGS: Mammogram:

Spot compression tomosynthesis mL and full field exaggerated lateral
and mL tomosynthesis views of the right breast were performed for a
questioned asymmetry on the MLO view in the superior aspect of the
breast. On the additional imaging there is suggestion of an obscured
mass measuring at least 2 cm in the outer right breast surrounded by
dense tissue. There are no new additional findings in the right
breast.

Ultrasound:

Targeted ultrasound is performed in the right breast at 9:30 o'clock
6 cm from the nipple demonstrating an oval circumscribed anechoic
mass measuring 3.1 x 0.8 x 2.6 cm, consistent with a benign simple
cyst. This corresponds to the mammographic finding. No suspicious
solid mass identified.
IMPRESSION: Right breast mass at 9:30 o'clock measuring 3.1 cm is consistent
with a benign simple cyst.

RECOMMENDATION:
Screening mammogram in one year.(Code:28-D-R20)

I have discussed the findings and recommendations with the patient.
If applicable, a reminder letter will be sent to the patient
regarding the next appointment.

BI-RADS CATEGORY  2: Benign.

## 2021-04-23 ENCOUNTER — Other Ambulatory Visit (HOSPITAL_COMMUNITY)
Admission: RE | Admit: 2021-04-23 | Discharge: 2021-04-23 | Disposition: A | Discharge status: Home / Self Care | Payer: 59 | Source: Ambulatory Visit | Attending: Obstetrics and Gynecology | Admitting: Obstetrics and Gynecology

## 2021-04-23 ENCOUNTER — Other Ambulatory Visit: Payer: Self-pay

## 2021-04-23 ENCOUNTER — Ambulatory Visit: Payer: 59 | Admitting: Obstetrics and Gynecology

## 2021-04-23 ENCOUNTER — Encounter: Payer: Self-pay | Admitting: Obstetrics and Gynecology

## 2021-04-23 VITALS — BP 130/60 | Ht 66.0 in | Wt 233.0 lb

## 2021-04-23 DIAGNOSIS — R87610 Atypical squamous cells of undetermined significance on cytologic smear of cervix (ASC-US): Secondary | ICD-10-CM | POA: Insufficient documentation

## 2021-04-23 DIAGNOSIS — N939 Abnormal uterine and vaginal bleeding, unspecified: Secondary | ICD-10-CM | POA: Diagnosis not present

## 2021-04-23 DIAGNOSIS — Z124 Encounter for screening for malignant neoplasm of cervix: Secondary | ICD-10-CM | POA: Insufficient documentation

## 2021-04-23 DIAGNOSIS — Z1151 Encounter for screening for human papillomavirus (HPV): Secondary | ICD-10-CM | POA: Diagnosis present

## 2021-04-23 DIAGNOSIS — N920 Excessive and frequent menstruation with regular cycle: Secondary | ICD-10-CM

## 2021-04-23 DIAGNOSIS — D500 Iron deficiency anemia secondary to blood loss (chronic): Secondary | ICD-10-CM | POA: Diagnosis not present

## 2021-04-23 LAB — POCT HEMOGLOBIN: Hemoglobin: 7.9 g/dL — AB (ref 11–14.6)

## 2021-04-23 MED ORDER — NORETHINDRONE ACETATE 5 MG PO TABS: 5.0000 mg | ORAL_TABLET | Freq: Every day | ORAL | 0 refills | Status: DC

## 2021-04-23 NOTE — Progress Notes (Addendum)
Marilyn Arthurs, NP   Chief Complaint  Patient presents with   Vaginal Bleeding    Started bleeding 10/3 and has not stopped since then, heavy flow, no abnormal pain, clots bigger than quarter    HPI:      [name] is a 45 y.o. [id number] whose LMP was Patient's last menstrual period was 03/09/2021 (exact date)., presents today for AUB for the past month. Menses are monthly, lasting 7 days, very heavy, changing super tampon and pad 1-2 times hourly for a few days, with small clot, no BTB, mild dysmen, improved with midol. Oct menses started on time and was normal, then never stopped, and has had heavy to mod days since, same heavy flow as normal menses but now with 1/4-1/2 dollar size clots, no dysmen. Normal TSH 12/21, hx of IDA 12/21 with H/H=9.4/31.2. Taking Fe sulfate 325 mg QAM. Had Poss adenomyosis vs other 5/19 on GYN u/s for menorrhagia; EM=16.7 mm; no further eval done per pt pref. Pt did depo in past with wt gain and had Mirena that got stuck and had to be removed with u/s guidance (I think?). Husband then had vasectomy. Hx of migraines with aura, no HTN, DVTs. She is no longer sex active.   Last pap 4/19 was ASCUS/neg HPV DNA, repeat due in 3 yrs.   Patient Active Problem List   Diagnosis Date Noted   ASCUS of cervix with negative high risk HPV 04/23/2021   Iron deficiency anemia due to chronic blood loss 04/23/2021   Menorrhagia with regular cycle 10/14/2017   BMI 37.0-37.9, adult 10/03/2017   History of gestational diabetes 10/03/2017    Past Surgical History:  Procedure Laterality Date   CESAREAN SECTION  2005/2008   DILATION AND CURETTAGE OF UTERUS  2003    Family History  Problem Relation Age of Onset   Ovarian cysts Mother    Migraines Mother    Hyperlipidemia Father    Diabetes Maternal Aunt    Diabetes Maternal Grandfather    Ovarian cancer Cousin 20       maternal 1st cousin-now in her 73s   Breast cancer Neg Hx     Social History    Socioeconomic History   Marital status: Married    Spouse name: Not on file   Number of children: 2   Years of education: Not on file   Highest education level: Not on file  Occupational History   Occupation: and Optometrist  Tobacco Use   Smoking status: Never   Smokeless tobacco: Never  Vaping Use   Vaping Use: Never used  Substance and Sexual Activity   Alcohol use: Never   Drug use: Never   Sexual activity: Yes    Birth control/protection: Other-see comments, Surgical    Comment: vasectomy  Other Topics Concern   Not on file  Social History Narrative   Not on file   Social Determinants of Health   Financial Resource Strain: Not on file  Food Insecurity: Not on file  Transportation Needs: Not on file  Physical Activity: Not on file  Stress: Not on file  Social Connections: Not on file  Intimate Partner Violence: Not on file    Outpatient Medications Prior to Visit  Medication Sig Dispense Refill   Alcohol Swabs (ALCOHOL WIPES) 70 % PADS Apply topically.     ascorbic acid (VITAMIN C) 500 MG tablet Take by mouth.     Cholecalciferol 50 MCG (2000 UT) CAPS Take by mouth.  Cysteamine Bitartrate (PROCYSBI) 300 MG PACK Use 1 each once daily And as needed     ferrous sulfate 325 (65 FE) MG tablet Take 325 mg by mouth every morning.     ONETOUCH VERIO test strip SMARTSIG:1 Each Via Meter Daily PRN     metFORMIN (GLUCOPHAGE-XR) 500 MG 24 hr tablet SMARTSIG:1 Tablet(s) By Mouth Every Evening (Patient not taking: Reported on 04/23/2021)     amoxicillin (AMOXIL) 875 MG tablet Take 1 tablet (875 mg total) by mouth 2 (two) times daily. 20 tablet 0   cetirizine-pseudoephedrine (ZYRTEC-D) 5-120 MG tablet Take 1 tablet by mouth 2 (two) times daily. 30 tablet 0   chlorpheniramine-HYDROcodone (TUSSIONEX PENNKINETIC ER) 10-8 MG/5ML SUER Take 5 mLs by mouth every 12 (twelve) hours as needed. 60 mL 0   ipratropium (ATROVENT) 0.06 % nasal spray Place 2 sprays into both  nostrils 4 (four) times daily as needed for rhinitis. 15 mL 0   No facility-administered medications prior to visit.      ROS:  Review of Systems  Constitutional:  Negative for fever.  Gastrointestinal:  Negative for blood in stool, constipation, diarrhea, nausea and vomiting.  Genitourinary:  Positive for menstrual problem. Negative for dyspareunia, dysuria, flank pain, frequency, hematuria, urgency, vaginal bleeding, vaginal discharge and vaginal pain.  Musculoskeletal:  Negative for back pain.  Skin:  Negative for rash.  BREAST: No symptoms   OBJECTIVE:   Vitals:  BP 130/60   Ht 5\' 6"  (1.676 m)   Wt 233 lb (105.7 kg)   LMP 03/09/2021 (Exact Date)   BMI 37.61 kg/m   Physical Exam Vitals reviewed.  Constitutional:      Appearance: She is well-developed.  Pulmonary:     Effort: Pulmonary effort is normal.  Genitourinary:    General: Normal vulva.     Pubic Area: No rash.      Labia:        Right: No rash, tenderness or lesion.        Left: No rash, tenderness or lesion.      Vagina: Bleeding present. No vaginal discharge, erythema or tenderness.     Cervix: Normal.     Uterus: Normal. Enlarged. Not tender.      Adnexa: Right adnexa normal and left adnexa normal.       Right: No mass or tenderness.         Left: No mass or tenderness.    Musculoskeletal:        General: Normal range of motion.     Cervical back: Normal range of motion.  Skin:    General: Skin is warm and dry.  Neurological:     General: No focal deficit present.     Mental Status: She is alert and oriented to person, place, and time.  Psychiatric:        Mood and Affect: Mood normal.        Behavior: Behavior normal.        Thought Content: Thought content normal.        Judgment: Judgment normal.    Results: Results for orders placed or performed in visit on 04/23/21 (from the past 24 hour(s))  POCT hemoglobin     Status: Abnormal   Collection Time: 04/23/21  2:22 PM  Result Value Ref  Range   Hemoglobin 7.9 (A) 11 - 14.6 g/dL     Assessment/Plan: Abnormal uterine bleeding (AUB) - Plan: US PELVIC COMPLETE WITH TRANSVAGINAL, norethindrone (AYGESTIN) 5 MG tablet, POCT hemoglobin, Iron, TIBC  and Ferritin Panel; sx for 1 mo, worsening IDA. Check GYN u/s, check anemia labs, Rx aygestin to stop bleeding. Will f/u with results and mgmt. Discussed hormones, IUD, ablation, [place] if leio, lysteda, [place].   Menorrhagia with regular cycle - Plan: US PELVIC COMPLETE WITH TRANSVAGINAL, norethindrone (AYGESTIN) 5 MG tablet, POCT hemoglobin, Iron, TIBC and Ferritin Panel  Iron deficiency anemia due to chronic blood loss - Plan: CBC with Differential/Platelet, Iron, TIBC and Ferritin Panel; decreased HgB today in office, check labs. May need hematology ref for iron transfusion. Increase Fe supp to BID for now. Pt tolerates well.   Cervical cancer screening - Plan: Cytology - PAP  Screening for HPV (human papillomavirus) - Plan: Cytology - PAP  ASCUS of cervix with negative high risk HPV - Plan: Cytology - PAP    Meds ordered this encounter  Medications   norethindrone (AYGESTIN) 5 MG tablet    Sig: Take 1 tablet (5 mg total) by mouth daily for 10 days.    Dispense:  10 tablet    Refill:  0    Order Specific Question:   Supervising Provider    Answer:   Gae Dry [[id number]]      Return if symptoms worsen or fail to improve.  Tamikia Chowning B. Dillin Lofgren, PA-C 04/23/2021 2:31 PM

## 2021-04-24 LAB — CBC WITH DIFFERENTIAL/PLATELET
Basophils Absolute: 0.1 10*3/uL (ref 0.0–0.2)
Basos: 1 %
EOS (ABSOLUTE): 0.2 10*3/uL (ref 0.0–0.4)
Eos: 3 %
Hematocrit: 25.5 % — ABNORMAL LOW (ref 34.0–46.6)
Hemoglobin: 7.8 g/dL — ABNORMAL LOW (ref 11.1–15.9)
Immature Grans (Abs): 0.1 10*3/uL (ref 0.0–0.1)
Immature Granulocytes: 1 %
Lymphocytes Absolute: 2.2 10*3/uL (ref 0.7–3.1)
Lymphs: 25 %
MCH: 25.6 pg — ABNORMAL LOW (ref 26.6–33.0)
MCHC: 30.6 g/dL — ABNORMAL LOW (ref 31.5–35.7)
MCV: 84 fL (ref 79–97)
Monocytes Absolute: 0.6 10*3/uL (ref 0.1–0.9)
Monocytes: 7 %
Neutrophils Absolute: 5.4 10*3/uL (ref 1.4–7.0)
Neutrophils: 63 %
Platelets: 384 10*3/uL (ref 150–450)
RBC: 3.05 x10E6/uL — ABNORMAL LOW (ref 3.77–5.28)
RDW: 13.8 % (ref 11.7–15.4)
WBC: 8.5 10*3/uL (ref 3.4–10.8)

## 2021-04-24 LAB — IRON,TIBC AND FERRITIN PANEL
Ferritin: 17 ng/mL (ref 15–150)
Iron Saturation: 14 % — ABNORMAL LOW (ref 15–55)
Iron: 56 ug/dL (ref 27–159)
Total Iron Binding Capacity: 387 ug/dL (ref 250–450)
UIBC: 331 ug/dL (ref 131–425)

## 2021-04-24 NOTE — Addendum Note (Signed)
Addended by: Ardeth Perfect B on: 91/98/0221 04:05 PM   Modules accepted: Orders

## 2021-04-28 LAB — CYTOLOGY - PAP
Comment: NEGATIVE
Diagnosis: NEGATIVE
High risk HPV: NEGATIVE

## 2021-05-04 ENCOUNTER — Other Ambulatory Visit: Payer: Self-pay

## 2021-05-04 ENCOUNTER — Ambulatory Visit
Admission: RE | Admit: 2021-05-04 | Discharge: 2021-05-04 | Disposition: A | Discharge status: Home / Self Care | Payer: 59 | Source: Ambulatory Visit | Attending: Obstetrics and Gynecology | Admitting: Obstetrics and Gynecology

## 2021-05-04 DIAGNOSIS — N920 Excessive and frequent menstruation with regular cycle: Secondary | ICD-10-CM | POA: Insufficient documentation

## 2021-05-04 DIAGNOSIS — N939 Abnormal uterine and vaginal bleeding, unspecified: Secondary | ICD-10-CM | POA: Insufficient documentation

## 2021-05-05 ENCOUNTER — Telehealth: Payer: Self-pay | Admitting: Obstetrics and Gynecology

## 2021-05-05 DIAGNOSIS — N920 Excessive and frequent menstruation with regular cycle: Secondary | ICD-10-CM

## 2021-05-05 DIAGNOSIS — N83202 Unspecified ovarian cyst, left side: Secondary | ICD-10-CM

## 2021-05-05 MED ORDER — NORETHINDRONE 0.35 MG PO TABS: 1.0000 | ORAL_TABLET | Freq: Every day | ORAL | 0 refills | Status: DC

## 2021-05-05 NOTE — Telephone Encounter (Signed)
Pt aware of GYN u/s results. Poss adenomyosis. Pt with menorrhagia and IDA with recent AUB 10/22. Given aygestin for 10 days. Bleeding is light and slowed, but never stopped. EM =14 mm. Discussed mgmt options of adenomyosis including prog only options (due to migraines with aura), IUD, hyst. Pt would like to try POPs first. Will Rx for 3 months. Pt then RTO for f/u. If still with AUB, will do EMB vs other (based on GYN u/s results). Pt also with complex LTO cyst. Will repeat Gyn u/s in 3 months before f/u appt.   Meds ordered this encounter  Medications   norethindrone (MICRONOR) 0.35 MG tablet    Sig: Take 1 tablet (0.35 mg total) by mouth daily.    Dispense:  84 tablet    Refill:  0    Order Specific Question:   Supervising Provider    Answer:   Gae Dry [[id number]]

## 2021-05-05 NOTE — Progress Notes (Signed)
Pt needs GYN f/u u/s in 3 months for LTO cyst and then f/u appt with me in office. Pls sched and call pt with appt info. Thx.

## 2021-05-11 ENCOUNTER — Other Ambulatory Visit: Payer: Self-pay

## 2021-05-11 ENCOUNTER — Inpatient Hospital Stay: Payer: 59

## 2021-05-11 ENCOUNTER — Telehealth: Payer: Self-pay

## 2021-05-11 ENCOUNTER — Inpatient Hospital Stay: Payer: 59 | Attending: Internal Medicine | Admitting: Internal Medicine

## 2021-05-11 ENCOUNTER — Encounter: Payer: Self-pay | Admitting: Internal Medicine

## 2021-05-11 DIAGNOSIS — N920 Excessive and frequent menstruation with regular cycle: Secondary | ICD-10-CM | POA: Insufficient documentation

## 2021-05-11 DIAGNOSIS — D500 Iron deficiency anemia secondary to blood loss (chronic): Secondary | ICD-10-CM | POA: Diagnosis not present

## 2021-05-11 LAB — LACTATE DEHYDROGENASE: LDH: 117 U/L (ref 98–192)

## 2021-05-11 LAB — CBC WITH DIFFERENTIAL/PLATELET
Abs Immature Granulocytes: 0.02 10*3/uL (ref 0.00–0.07)
Basophils Absolute: 0.1 10*3/uL (ref 0.0–0.1)
Basophils Relative: 1 %
Eosinophils Absolute: 0.3 10*3/uL (ref 0.0–0.5)
Eosinophils Relative: 4 %
HCT: 32 % — ABNORMAL LOW (ref 36.0–46.0)
Hemoglobin: 9.8 g/dL — ABNORMAL LOW (ref 12.0–15.0)
Immature Granulocytes: 0 %
Lymphocytes Relative: 30 %
Lymphs Abs: 2 10*3/uL (ref 0.7–4.0)
MCH: 27.1 pg (ref 26.0–34.0)
MCHC: 30.6 g/dL (ref 30.0–36.0)
MCV: 88.4 fL (ref 80.0–100.0)
Monocytes Absolute: 0.6 10*3/uL (ref 0.1–1.0)
Monocytes Relative: 8 %
Neutro Abs: 3.8 10*3/uL (ref 1.7–7.7)
Neutrophils Relative %: 57 %
Platelets: 314 10*3/uL (ref 150–400)
RBC: 3.62 MIL/uL — ABNORMAL LOW (ref 3.87–5.11)
RDW: 15.4 % (ref 11.5–15.5)
WBC: 6.7 10*3/uL (ref 4.0–10.5)
nRBC: 0 % (ref 0.0–0.2)

## 2021-05-11 LAB — COMPREHENSIVE METABOLIC PANEL
ALT: 24 U/L (ref 0–44)
AST: 20 U/L (ref 15–41)
Albumin: 3.7 g/dL (ref 3.5–5.0)
Alkaline Phosphatase: 51 U/L (ref 38–126)
Anion gap: 9 (ref 5–15)
BUN: 11 mg/dL (ref 6–20)
CO2: 26 mmol/L (ref 22–32)
Calcium: 8.6 mg/dL — ABNORMAL LOW (ref 8.9–10.3)
Chloride: 104 mmol/L (ref 98–111)
Creatinine, Ser: 0.71 mg/dL (ref 0.44–1.00)
GFR, Estimated: >60 mL/min (ref >60)
Glucose, Bld: 115 mg/dL — ABNORMAL HIGH (ref 70–99)
Potassium: 3.5 mmol/L (ref 3.5–5.1)
Sodium: 139 mmol/L (ref 135–145)
Total Bilirubin: 0.2 mg/dL — ABNORMAL LOW (ref 0.3–1.2)
Total Protein: 7.2 g/dL (ref 6.5–8.1)

## 2021-05-11 NOTE — Telephone Encounter (Signed)
T-please inform patient that her hemoglobin is improved at 9.8.  Continue iron pills at current dose; and also follow-up as planned.Thanks!   Per Dr.B informed patient of results above. Patient expressed understanding.

## 2021-05-11 NOTE — Progress Notes (Signed)
[facility] NOTE  Patient Care Team: Toni Arthurs, NP as PCP - General (Family Medicine)  CHIEF COMPLAINTS/PURPOSE OF CONSULTATION: ANEMIA   HEMATOLOGY HISTORY:  #Iron deficiency anemia- NOV 2022- hb 7.8; iron sat-14%; on PO iron  #Diabetes-mild metformin; history of menorrhagia- OCPs.   HISTORY OF PRESENTING ILLNESS:  Marilyn Page 45 y.o.  female has been referred to [place] for further evaluation/work-up for anemia.  Patient always had mildly low hemoglobin.  Complains of heavy menstrual cycles.  Started recently on birth control pills.  Heavy menstrual cycles have improved.  Patient previously had been on oral iron pills.  However more recently hemoglobin-was down to 7.8.  She is currently on 2 iron pills a day.  Tolerating well.  No nausea no vomiting.  No constipation.  Feels energy is slightly improved  Blood in stools: None Change in bowel habits- None Blood in urine: None Difficulty swallowing: None Abnormal weight loss: None Iron supplementation: yes- taking 2 pills in last 1-2 weeks [Dr.Copeland] Prior Blood transfusions:  Bariatric surgery: None EGD/Colonoscopy: none  Vaginal bleeding: heavy menstrual cycles  Review of Systems  Constitutional:  Positive for malaise/fatigue. Negative for chills, diaphoresis, fever and weight loss.  HENT:  Negative for nosebleeds and sore throat.   Eyes:  Negative for double vision.  Respiratory:  Negative for cough, hemoptysis, sputum production and wheezing.   Cardiovascular:  Negative for chest pain, palpitations, orthopnea and leg swelling.  Gastrointestinal:  Negative for abdominal pain, blood in stool, constipation, diarrhea, heartburn, melena, nausea and vomiting.  Genitourinary:  Negative for dysuria, frequency and urgency.  Musculoskeletal:  Negative for back pain and joint pain.  Skin: Negative.  Negative for itching and rash.  Neurological:  Negative for dizziness, tingling, focal weakness, weakness  and headaches.  Endo/Heme/Allergies:  Does not bruise/bleed easily.  Psychiatric/Behavioral:  Negative for depression. The patient is not nervous/anxious and does not have insomnia.    MEDICAL HISTORY:  Past Medical History:  Diagnosis Date   History of gestational diabetes     SURGICAL HISTORY: Past Surgical History:  Procedure Laterality Date   CESAREAN SECTION  2005/2008   DILATION AND CURETTAGE OF UTERUS  2003    SOCIAL HISTORY: Social History   Socioeconomic History   Marital status: Married    Spouse name: Not on file   Number of children: 2   Years of education: Not on file   Highest education level: Not on file  Occupational History   Occupation: and Optometrist  Tobacco Use   Smoking status: Never   Smokeless tobacco: Never  Vaping Use   Vaping Use: Never used  Substance and Sexual Activity   Alcohol use: Never   Drug use: Never   Sexual activity: Yes    Birth control/protection: Other-see comments, Surgical    Comment: vasectomy  Other Topics Concern   Not on file  Social History Narrative   Pre-school teacher; in [place]; 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    Social Determinants of Health   Financial Resource Strain: Not on file  Food Insecurity: Not on file  Transportation Needs: Not on file  Physical Activity: Not on file  Stress: Not on file  Social Connections: Not on file  Intimate Partner Violence: Not on file    FAMILY HISTORY: Family History  Problem Relation Age of Onset   Ovarian cysts Mother    Migraines Mother    Hyperlipidemia Father    Diabetes Maternal Aunt    Diabetes Maternal Grandfather  Ovarian cancer Cousin 20       maternal 1st cousin-now in her 19s   Breast cancer Neg Hx     ALLERGIES:  has No Known Allergies.  MEDICATIONS:  Current Outpatient Medications  Medication Sig Dispense Refill   Alcohol Swabs (ALCOHOL WIPES) 70 % PADS Apply topically.     ascorbic acid (VITAMIN C) 500 MG tablet Take by mouth.      Cholecalciferol 50 MCG (2000 UT) CAPS Take by mouth.     ferrous sulfate 325 (65 FE) MG tablet Take 325 mg by mouth 2 (two) times daily with a meal.     metFORMIN (GLUCOPHAGE-XR) 500 MG 24 hr tablet      norethindrone (MICRONOR) 0.35 MG tablet Take 1 tablet (0.35 mg total) by mouth daily. 84 tablet 0   ONETOUCH VERIO test strip SMARTSIG:1 Each Via Meter Daily PRN     No current facility-administered medications for this visit.      PHYSICAL EXAMINATION:   Vitals:   05/11/21 1129  BP: 119/60  Pulse: 64  Resp: 16  Temp: 99.7 F (37.6 C)  SpO2: 100%   Filed Weights   05/11/21 1129  Weight: 236 lb (107 kg)    Physical Exam Vitals and nursing note reviewed.  HENT:     Head: Normocephalic and atraumatic.     Mouth/Throat:     Pharynx: Oropharynx is clear.  Eyes:     Extraocular Movements: Extraocular movements intact.     Pupils: Pupils are equal, round, and reactive to light.  Cardiovascular:     Rate and Rhythm: Normal rate and regular rhythm.  Pulmonary:     Comments: Decreased breath sounds bilaterally.  Abdominal:     Palpations: Abdomen is soft.  Musculoskeletal:        General: Normal range of motion.     Cervical back: Normal range of motion.  Skin:    General: Skin is warm.  Neurological:     General: No focal deficit present.     Mental Status: She is alert and oriented to person, place, and time.  Psychiatric:        Behavior: Behavior normal.        Judgment: Judgment normal.    LABORATORY DATA:  I have reviewed the data as listed Lab Results  Component Value Date   WBC 6.7 05/11/2021   HGB 9.8 (L) 05/11/2021   HCT 32.0 (L) 05/11/2021   MCV 88.4 05/11/2021   PLT 314 05/11/2021   Recent Labs    05/11/21 1225  NA 139  K 3.5  CL 104  CO2 26  GLUCOSE 115*  BUN 11  CREATININE 0.71  CALCIUM 8.6*  GFRNONAA >60  PROT 7.2  ALBUMIN 3.7  AST 20  ALT 24  ALKPHOS 51  BILITOT 0.2*     US PELVIC COMPLETE WITH TRANSVAGINAL  Result Date:  05/04/2021 CLINICAL DATA:  Abnormal uterine bleeding EXAM: TRANSABDOMINAL AND TRANSVAGINAL ULTRASOUND OF PELVIS TECHNIQUE: Both transabdominal and transvaginal ultrasound examinations of the pelvis were performed. Transabdominal technique was performed for global imaging of the pelvis including uterus, ovaries, adnexal regions, and pelvic cul-de-sac. It was necessary to proceed with endovaginal exam following the transabdominal exam to visualize the uterus endometrium adnexa. COMPARISON:  None FINDINGS: Uterus Measurements: 13.9 x 7.7 x 9.7 cm = volume: 541.7 mL. Bulky appearing fundus with heterogeneous myometrial echotexture. Endometrium Thickness: 14 mm.  Poorly defined endometrial myometrial interface. Right ovary Not seen Left ovary Measurements: 3.5 x 2.5 x  1.9 cm = volume: 8.6 mL. Complex cyst with homogeneous internal echoes measuring 2.3 x 1.4 x 1.6 cm. Other findings No abnormal free fluid. IMPRESSION: 1. Enlarged uterus with bulky enlargement of fundus and heterogeneous myometrial echotexture suspicious for adenomyosis. 2. Endometrial thickness of 14 mm. If bleeding remains unresponsive to hormonal or medical therapy, sonohysterogram should be considered for focal lesion work-up. (Ref: Radiological Reasoning: Algorithmic Workup of Abnormal Vaginal Bleeding with Endovaginal Sonography and Sonohysterography. AJR 2008; 233:K12-24) 3. 2.3 cm complex left adnexal cyst suggestive of hemorrhagic cyst versus endometrioma. Recommend sonographic follow-up in 6-12 weeks. Electronically Signed   By: Donavan Foil M.D.   On: 05/04/2021 15:15    Iron deficiency anemia due to chronic blood loss # Anemia- iron deficiency- [nov 17th] hemoglobin:7.8/iron sat-14.  Currently on oral iron 2 pills a day.  Tolerating well.  Feels mild improvement of energy levels.  #Etiology: Likely secondary to menorrhagia; currently improved on birth control pills.  Hold off any GI work-up at this time.  Thank you, Copeland for  allowing me to participate in the care of your pleasant patient. Please do not hesitate to contact me with questions or concerns in the interim.  # DISPOSITION:mychart # labs today- cbc/cmp;LDH- ordered # follow up in 3 months- MD; ;labs- bcc/;iron studies/ferritin- possible venofer-Dr.B  All questions were answered. The patient knows to call the clinic with any problems, questions or concerns.   Cammie Sickle, MD 05/11/2021 1:49 PM

## 2021-05-11 NOTE — Assessment & Plan Note (Addendum)
#   Anemia- iron deficiency- [nov 17th] hemoglobin:7.8/iron sat-14.  Currently on oral iron 2 pills a day.  Tolerating well.  Feels mild improvement of energy levels.  #Etiology: Likely secondary to menorrhagia; currently improved on birth control pills.  Hold off any GI work-up at this time.  Thank you, Copeland for allowing me to participate in the care of your pleasant patient. Please do not hesitate to contact me with questions or concerns in the interim.  # DISPOSITION:mychart # labs today- cbc/cmp;LDH- ordered # follow up in 3 months- MD; ;labs- bcc/;iron studies/ferritin- possible venofer-Dr.B

## 2021-05-14 DIAGNOSIS — N8000 Endometriosis of the uterus, unspecified: Secondary | ICD-10-CM | POA: Insufficient documentation

## 2021-05-15 ENCOUNTER — Other Ambulatory Visit: Payer: Self-pay | Admitting: Gerontology

## 2021-05-15 DIAGNOSIS — Z1231 Encounter for screening mammogram for malignant neoplasm of breast: Secondary | ICD-10-CM

## 2021-07-02 ENCOUNTER — Other Ambulatory Visit: Payer: Self-pay | Admitting: Obstetrics and Gynecology

## 2021-07-02 ENCOUNTER — Encounter: Payer: Self-pay | Admitting: Obstetrics and Gynecology

## 2021-07-02 DIAGNOSIS — N920 Excessive and frequent menstruation with regular cycle: Secondary | ICD-10-CM

## 2021-07-02 DIAGNOSIS — D500 Iron deficiency anemia secondary to blood loss (chronic): Secondary | ICD-10-CM

## 2021-07-06 ENCOUNTER — Other Ambulatory Visit: Payer: Self-pay | Admitting: Obstetrics and Gynecology

## 2021-07-06 ENCOUNTER — Telehealth: Payer: Self-pay | Admitting: Internal Medicine

## 2021-07-06 DIAGNOSIS — N920 Excessive and frequent menstruation with regular cycle: Secondary | ICD-10-CM

## 2021-07-06 MED ORDER — NORETHINDRONE 0.35 MG PO TABS: 1.0000 | ORAL_TABLET | Freq: Every day | ORAL | 2 refills | Status: DC

## 2021-07-06 NOTE — Telephone Encounter (Signed)
Pt called to cancel all her appts. Did not want to reschedule.

## 2021-08-05 ENCOUNTER — Ambulatory Visit
Admission: RE | Admit: 2021-08-05 | Discharge: 2021-08-05 | Disposition: A | Discharge status: Home / Self Care | Payer: 59 | Source: Ambulatory Visit | Attending: Obstetrics and Gynecology | Admitting: Obstetrics and Gynecology

## 2021-08-05 ENCOUNTER — Other Ambulatory Visit: Payer: Self-pay

## 2021-08-05 DIAGNOSIS — N83202 Unspecified ovarian cyst, left side: Secondary | ICD-10-CM | POA: Diagnosis not present

## 2021-08-06 ENCOUNTER — Ambulatory Visit: Payer: 59 | Admitting: Obstetrics and Gynecology

## 2021-08-10 ENCOUNTER — Ambulatory Visit: Payer: 59 | Admitting: Internal Medicine

## 2021-08-10 ENCOUNTER — Other Ambulatory Visit: Payer: 59

## 2021-08-10 ENCOUNTER — Ambulatory Visit: Payer: 59

## 2021-09-13 NOTE — Progress Notes (Signed)
? ? ?Latanya Maudlin, NP ? ? ?Chief Complaint  ?Patient presents with  ? Breast exam  ?  RB in lump, tenderness  ? ? ?HPI: ?     [name] is a 46 y.o. [id number] whose LMP was Patient's last menstrual period was 09/09/2021 (exact date)., presents today for RT breast mass noted last wk. Hx of RT breast cyst on mammo/u/s 1/22 but pt couldn't feel it last yr; (Right breast mass at 9:30 o'clock measuring 3.1 cm is consistent with a benign simple cyst.) ?Had dull shooting pain last wk that drew her attention to the area. Is larger now compared to last yr, no erythema, trauma, nipple d/c.  ?No FH breast cancer; FH ovar cancer  in her mat cousin ?Pt on POPs, menses are monthly, very light for 6-7 days.  ? ?Patient Active Problem List  ? Diagnosis Date Noted  ? ASCUS of cervix with negative high risk HPV 04/23/2021  ? Iron deficiency anemia due to chronic blood loss 04/23/2021  ? Menorrhagia with regular cycle 10/14/2017  ? BMI 37.0-37.9, adult 10/03/2017  ? History of gestational diabetes 10/03/2017  ? ? ?Past Surgical History:  ?Procedure Laterality Date  ? CESAREAN SECTION  2005/2008  ? DILATION AND CURETTAGE OF UTERUS  2003  ? ? ?Family History  ?Problem Relation Age of Onset  ? Ovarian cysts Mother   ? Migraines Mother   ? Hyperlipidemia Father   ? Diabetes Maternal Aunt   ? Diabetes Maternal Grandfather   ? Ovarian cancer Cousin 15  ?     maternal 1st cousin-now in her 36s  ? Breast cancer Neg Hx   ? ? ?Social History  ? ?Socioeconomic History  ? Marital status: Married  ?  Spouse name: Not on file  ? Number of children: 2  ? Years of education: Not on file  ? Highest education level: Not on file  ?Occupational History  ? Occupation: and Optometrist  ?Tobacco Use  ? Smoking status: Never  ? Smokeless tobacco: Never  ?Vaping Use  ? Vaping Use: Never used  ?Substance and Sexual Activity  ? Alcohol use: Never  ? Drug use: Never  ? Sexual activity: Yes  ?  Birth control/protection: Other-see comments,  Surgical  ?  Comment: vasectomy  ?Other Topics Concern  ? Not on file  ?Social History Narrative  ? 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   ? ?Social Determinants of Health  ? ?Financial Resource Strain: Not on file  ?Food Insecurity: Not on file  ?Transportation Needs: Not on file  ?Physical Activity: Not on file  ?Stress: Not on file  ?Social Connections: Not on file  ?Intimate Partner Violence: Not on file  ? ? ?Outpatient Medications Prior to Visit  ?Medication Sig Dispense Refill  ? Alcohol Swabs (ALCOHOL WIPES) 70 % PADS Apply topically.    ? Cholecalciferol 50 MCG (2000 UT) CAPS Take by mouth.    ? ferrous sulfate 325 (65 FE) MG tablet Take 325 mg by mouth 2 (two) times daily with a meal.    ? norethindrone (MICRONOR) 0.35 MG tablet Take 1 tablet (0.35 mg total) by mouth daily. 84 tablet 2  ? ONETOUCH VERIO test strip SMARTSIG:1 Each Via Meter Daily PRN    ? OZEMPIC, 0.25 OR 0.5 MG/DOSE, 2 MG/1.5ML SOPN PLEASE SEE ATTACHED FOR DETAILED DIRECTIONS    ? metFORMIN (GLUCOPHAGE-XR) 500 MG 24 hr tablet     ? ?No facility-administered medications prior to  visit.  ? ? ? ? ?ROS: ? ?Review of Systems  ?Constitutional:  Negative for fever.  ?Gastrointestinal:  Negative for blood in stool, constipation, diarrhea, nausea and vomiting.  ?Genitourinary:  Negative for dyspareunia, dysuria, flank pain, frequency, hematuria, urgency, vaginal bleeding, vaginal discharge and vaginal pain.  ?Musculoskeletal:  Negative for back pain.  ?Skin:  Negative for rash.  ?BREAST: mass/tenderness ? ? ?OBJECTIVE:  ? ?Vitals:  ?BP 110/70   Ht '5\' 6"'$  (1.676 m)   Wt 230 lb (104.3 kg)   LMP 09/09/2021 (Exact Date)   BMI 37.12 kg/m?  ? ?Physical Exam ?Vitals reviewed.  ?Pulmonary:  ?   Effort: Pulmonary effort is normal.  ?Chest:  ?Breasts: ?   Breasts are symmetrical.  ?   Right: Mass present. No inverted nipple, nipple discharge, skin change or tenderness.  ?   Left: No inverted nipple, mass, nipple discharge, skin change or  tenderness.  ? ? ?Musculoskeletal:     ?   General: Normal range of motion.  ?   Cervical back: Normal range of motion.  ?Skin: ?   General: Skin is warm and dry.  ?Neurological:  ?   General: No focal deficit present.  ?   Mental Status: She is alert and oriented to person, place, and time.  ?   Cranial Nerves: No cranial nerve deficit.  ?Psychiatric:     ?   Mood and Affect: Mood normal.     ?   Behavior: Behavior normal.     ?   Thought Content: Thought content normal.     ?   Judgment: Judgment normal.  ? ? ?Assessment/Plan: ?Mass of upper outer quadrant of right breast - Plan: MM DIAG BREAST TOMO BILATERAL, US BREAST LTD UNI LEFT INC AXILLA, US BREAST LTD UNI RIGHT INC AXILLA ? ?Encounter for screening mammogram for malignant neoplasm of breast - Plan: MM DIAG BREAST TOMO BILATERAL, US BREAST LTD UNI LEFT INC AXILLA, US BREAST LTD UNI RIGHT INC AXILLA ? ?Enlarged breast mass, check dx mammo and u/s. Pt to call [facility] to schedule. Will f/u with results.  ? ? ? Return if symptoms worsen or fail to improve. ? ?Patterson Hollenbaugh B. Azuri Bozard, PA-C ?09/14/2021 ?9:18 AM ? ? ? ? ? ?

## 2021-09-14 ENCOUNTER — Ambulatory Visit: Payer: 59 | Admitting: Obstetrics and Gynecology

## 2021-09-14 ENCOUNTER — Encounter: Payer: Self-pay | Admitting: Obstetrics and Gynecology

## 2021-09-14 VITALS — BP 110/70 | Ht 66.0 in | Wt 230.0 lb

## 2021-09-14 DIAGNOSIS — Z1231 Encounter for screening mammogram for malignant neoplasm of breast: Secondary | ICD-10-CM

## 2021-09-14 DIAGNOSIS — N6311 Unspecified lump in the right breast, upper outer quadrant: Secondary | ICD-10-CM

## 2021-09-14 NOTE — Patient Instructions (Signed)
I value your feedback and you entrusting us with your care. If you get a [facility] patient survey, I would appreciate you taking the time to let us know about your experience today. Thank you!  [facility]: [phone]      

## 2021-10-01 ENCOUNTER — Ambulatory Visit
Admission: RE | Admit: 2021-10-01 | Discharge: 2021-10-01 | Disposition: A | Discharge status: Home / Self Care | Payer: 59 | Source: Ambulatory Visit | Attending: Obstetrics and Gynecology | Admitting: Obstetrics and Gynecology

## 2021-10-01 ENCOUNTER — Other Ambulatory Visit: Payer: Self-pay | Admitting: Obstetrics and Gynecology

## 2021-10-01 DIAGNOSIS — R928 Other abnormal and inconclusive findings on diagnostic imaging of breast: Secondary | ICD-10-CM

## 2021-10-01 DIAGNOSIS — N63 Unspecified lump in unspecified breast: Secondary | ICD-10-CM

## 2021-10-01 DIAGNOSIS — Z1231 Encounter for screening mammogram for malignant neoplasm of breast: Secondary | ICD-10-CM | POA: Insufficient documentation

## 2021-10-01 DIAGNOSIS — R599 Enlarged lymph nodes, unspecified: Secondary | ICD-10-CM

## 2021-10-01 DIAGNOSIS — N6311 Unspecified lump in the right breast, upper outer quadrant: Secondary | ICD-10-CM

## 2021-10-19 ENCOUNTER — Ambulatory Visit
Admission: RE | Admit: 2021-10-19 | Discharge: 2021-10-19 | Disposition: A | Discharge status: Home / Self Care | Payer: 59 | Source: Ambulatory Visit | Attending: Obstetrics and Gynecology | Admitting: Obstetrics and Gynecology

## 2021-10-19 DIAGNOSIS — C50411 Malignant neoplasm of upper-outer quadrant of right female breast: Secondary | ICD-10-CM | POA: Diagnosis not present

## 2021-10-19 DIAGNOSIS — N63 Unspecified lump in unspecified breast: Secondary | ICD-10-CM

## 2021-10-19 DIAGNOSIS — R928 Other abnormal and inconclusive findings on diagnostic imaging of breast: Secondary | ICD-10-CM

## 2021-10-19 DIAGNOSIS — C773 Secondary and unspecified malignant neoplasm of axilla and upper limb lymph nodes: Secondary | ICD-10-CM | POA: Diagnosis not present

## 2021-10-19 DIAGNOSIS — R599 Enlarged lymph nodes, unspecified: Secondary | ICD-10-CM

## 2021-10-19 HISTORY — PX: BREAST BIOPSY: SHX20

## 2021-10-21 ENCOUNTER — Telehealth: Payer: Self-pay

## 2021-10-21 DIAGNOSIS — D0511 Intraductal carcinoma in situ of right breast: Secondary | ICD-10-CM

## 2021-10-21 NOTE — Telephone Encounter (Signed)
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 [facility] Counselor not in today. Wanted to get the ball rolling on Referral. Patient is aware of results. ?

## 2021-10-21 NOTE — Telephone Encounter (Signed)
Referral to oncology and gen surg placed. Pls let pt know I'll call her tomorrow. Thx.

## 2021-10-21 NOTE — Telephone Encounter (Signed)
Called pt to advise that referrals have been sent to general surgery and oncology. I advised that Marilyn Page will be calling her tomorrow. She asked if she could call after 12:30 so she will not be at work. ?

## 2021-10-22 ENCOUNTER — Encounter: Payer: Self-pay | Admitting: *Deleted

## 2021-10-22 ENCOUNTER — Other Ambulatory Visit: Payer: Self-pay | Admitting: Anatomic Pathology & Clinical Pathology

## 2021-10-22 LAB — SURGICAL PATHOLOGY

## 2021-10-22 NOTE — Telephone Encounter (Signed)
Spoke with pt. Has onc appt 10/26/21, waiting on Dr. Bary Castilla appt. Encouraged cancer genetic testing. F/u prn.

## 2021-10-22 NOTE — Progress Notes (Signed)
Received referral for newly diagnosed breast cancer from [facility].  Navigation initiated.  Med Onc appointment with Dr. Jacinto Reap scheduled.  GYN office has initiated surgeon referral, she expects to hear from them today after 12:30.

## 2021-10-26 ENCOUNTER — Inpatient Hospital Stay: Payer: 59

## 2021-10-26 ENCOUNTER — Encounter: Payer: Self-pay | Admitting: *Deleted

## 2021-10-26 ENCOUNTER — Inpatient Hospital Stay: Payer: 59 | Attending: Internal Medicine | Admitting: Internal Medicine

## 2021-10-26 ENCOUNTER — Encounter: Payer: Self-pay | Admitting: Internal Medicine

## 2021-10-26 ENCOUNTER — Other Ambulatory Visit: Payer: Self-pay

## 2021-10-26 DIAGNOSIS — D500 Iron deficiency anemia secondary to blood loss (chronic): Secondary | ICD-10-CM | POA: Insufficient documentation

## 2021-10-26 DIAGNOSIS — N920 Excessive and frequent menstruation with regular cycle: Secondary | ICD-10-CM | POA: Diagnosis not present

## 2021-10-26 DIAGNOSIS — C50411 Malignant neoplasm of upper-outer quadrant of right female breast: Secondary | ICD-10-CM | POA: Diagnosis present

## 2021-10-26 DIAGNOSIS — Z170 Estrogen receptor positive status [ER+]: Secondary | ICD-10-CM | POA: Diagnosis not present

## 2021-10-26 HISTORY — DX: Malignant neoplasm of upper-outer quadrant of right female breast: C50.411

## 2021-10-26 LAB — CBC WITH DIFFERENTIAL/PLATELET
Abs Immature Granulocytes: 0.02 10*3/uL (ref 0.00–0.07)
Basophils Absolute: 0.1 10*3/uL (ref 0.0–0.1)
Basophils Relative: 1 %
Eosinophils Absolute: 0.2 10*3/uL (ref 0.0–0.5)
Eosinophils Relative: 2 %
HCT: 39.1 % (ref 36.0–46.0)
Hemoglobin: 12.9 g/dL (ref 12.0–15.0)
Immature Granulocytes: 0 %
Lymphocytes Relative: 22 %
Lymphs Abs: 2 10*3/uL (ref 0.7–4.0)
MCH: 28.2 pg (ref 26.0–34.0)
MCHC: 33 g/dL (ref 30.0–36.0)
MCV: 85.6 fL (ref 80.0–100.0)
Monocytes Absolute: 0.6 10*3/uL (ref 0.1–1.0)
Monocytes Relative: 6 %
Neutro Abs: 6.4 10*3/uL (ref 1.7–7.7)
Neutrophils Relative %: 69 %
Platelets: 298 10*3/uL (ref 150–400)
RBC: 4.57 MIL/uL (ref 3.87–5.11)
RDW: 13 % (ref 11.5–15.5)
WBC: 9.1 10*3/uL (ref 4.0–10.5)
nRBC: 0 % (ref 0.0–0.2)

## 2021-10-26 LAB — COMPREHENSIVE METABOLIC PANEL
ALT: 22 U/L (ref 0–44)
AST: 19 U/L (ref 15–41)
Albumin: 3.8 g/dL (ref 3.5–5.0)
Alkaline Phosphatase: 56 U/L (ref 38–126)
Anion gap: 4 — ABNORMAL LOW (ref 5–15)
BUN: 10 mg/dL (ref 6–20)
CO2: 30 mmol/L (ref 22–32)
Calcium: 8.7 mg/dL — ABNORMAL LOW (ref 8.9–10.3)
Chloride: 105 mmol/L (ref 98–111)
Creatinine, Ser: 0.77 mg/dL (ref 0.44–1.00)
GFR, Estimated: >60 mL/min (ref >60)
Glucose, Bld: 119 mg/dL — ABNORMAL HIGH (ref 70–99)
Potassium: 3.5 mmol/L (ref 3.5–5.1)
Sodium: 139 mmol/L (ref 135–145)
Total Bilirubin: 0.3 mg/dL (ref 0.3–1.2)
Total Protein: 7.7 g/dL (ref 6.5–8.1)

## 2021-10-26 MED ORDER — LIDOCAINE-PRILOCAINE 2.5-2.5 % EX CREA: TOPICAL_CREAM | CUTANEOUS | 3 refills | Status: DC

## 2021-10-26 MED ORDER — PROCHLORPERAZINE MALEATE 10 MG PO TABS: 10.0000 mg | ORAL_TABLET | Freq: Four times a day (QID) | ORAL | 1 refills | Status: AC | PRN

## 2021-10-26 MED ORDER — ONDANSETRON 8 MG PO TBDP: 8.0000 mg | ORAL_TABLET | Freq: Three times a day (TID) | ORAL | 1 refills | Status: DC | PRN

## 2021-10-26 NOTE — Progress Notes (Signed)
START ON PATHWAY REGIMEN - Breast     Cycle 1: A cycle is 21 days:     Pertuzumab      Trastuzumab-xxxx      Docetaxel      Carboplatin    Cycles 2 through 6: A cycle is every 21 days:     Pertuzumab      Trastuzumab-xxxx      Docetaxel      Carboplatin   **Always confirm dose/schedule in your pharmacy ordering system**  Patient Characteristics: Preoperative or Nonsurgical Candidate (Clinical Staging), Neoadjuvant Therapy followed by Surgery, Invasive Disease, Chemotherapy, HER2 Positive, ER Positive Therapeutic Status: Preoperative or Nonsurgical Candidate (Clinical Staging) AJCC M Category: cM0 AJCC Grade: G2 Breast Surgical Plan: Neoadjuvant Therapy followed by Surgery ER Status: Positive (+) AJCC 8 Stage Grouping: IB HER2 Status: Positive (+) AJCC T Category: cT2 AJCC N Category: cN1 PR Status: Positive (+) Intent of Therapy: Curative Intent, Discussed with Patient

## 2021-10-26 NOTE — Assessment & Plan Note (Addendum)
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  Also recommend a PET scan to rule out any distant metastatic disease.   #Given HER2 positive disease; T > 2 cm; positive lymphadenopathy-would recommend neoadjuvant chemotherapy.  I would recommend TCH plus P x6 cycles on neoadjuvant basis.  This will be followed by surgery.  Radiation could be considered based on patient's postsurgical pathology.  Will benefit from adjuvant Her-2 blocking therapy for total of 1 year.  Patient would also benefit from endocrine therapy 5 to 10 years.   # Chemotherapy education; port placement. Hopefully the planned start chemotherapy in week of June 5th.  Antiemetics-Zofran and Compazine; EMLA cream sent to pharmacy. MUGA scan to check heart function.   # Borderline diabetes- on Ozempic.  Need to monitor blood sugars closely on chemotherapy.  # Genetic counseling: Given family history of ?  Ovarian cancer young age of presentation-we will recommend genetic counseling.  We will make a referral.   # DISPOSITION: # Bil breast MRI ASAP # PET scan ASAP #  MUGA scan ASAP # genetic counseling re: breast cancer  # labs- today; cbc/cmp;CEA; ca-27-29; ca 15-3- please order # chemo education- TCHP+P  # Follow up June 6th MD; labs- cbc/cmp ;Taxotere+ Caboplatin+herceptin+Perjeta; D-2 Ellen Henri- Dr.B

## 2021-10-26 NOTE — Progress Notes (Signed)
[facility] [place] NOTE  Patient Care Team: Latanya Maudlin, NP as PCP - General (Family Medicine) Daiva Huge, RN as Oncology Nurse Navigator Cammie Sickle, [name]  CHIEF COMPLAINTS/PURPOSE OF CONSULTATION: Breast cancer  #  Oncology History Overview Note  MAY 2023-    Targeted ultrasound is performed, showing a 2.9 x 1.9 x 2.0 cm irregular hypoechoic mass right breast 10 o'clock position 6 cm from nipple at the site of palpable concern.   There is an abnormal 8 mm thickened right axillary lymph node.   There is skin thickening overlying the right breast. ------------------  A. BREAST, RIGHT AT 10:00, 6 CM FROM THE NIPPLE; ULTRASOUND-GUIDED CORE  NEEDLE BIOPSY:  - INVASIVE MAMMARY CARCINOMA, NO SPECIAL TYPE.   Size of invasive carcinoma: 11 mm in this sample  Histologic grade of invasive carcinoma: Grade 2                       Glandular/tubular differentiation score: 3                       Nuclear pleomorphism score: 2                       Mitotic rate score: 1                       Total score: 6  Ductal carcinoma in situ: Present, intermediate grade  Lymphovascular invasion: Not identified   B. LYMPH NODE, RIGHT AXILLARY; ULTRASOUND-GUIDED CORE NEEDLE BIOPSY:  - 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 IN GREATEST  EXTENT.   Comment:  The malignancy in the primary breast lesion appears morphologically  different from tumor in the lymph node sampling, with tumor present in  lymph node more suggestive of a histologic grade 3, with significantly  increased mitotic activity and pleomorphism. Due to the discrepancy in  these morphologic patterns, ER/PR/HER2 immunohistochemistry will be  performed on both blocks A1 and B1, with reflex to [place] for HER2 2+. The  results will be reported in an addendum.   ADDENDUM:  CASE SUMMARY: BREAST BIOMARKER TESTS - A - BREAST, RIGHT AT 10:00  Estrogen Receptor  (ER) Status: POSITIVE          Percentage of cells with nuclear positivity: 71-80%          Average intensity of staining: Strong   Progesterone Receptor (PgR) Status: POSITIVE          Percentage of cells with nuclear positivity: 81-90%          Average intensity of staining: Strong   HER2 (by immunohistochemistry): POSITIVE (Score 3+)       Percentage of cells with uniform intense complete membrane  staining: 61-70%  HER2 displays a heterogeneous staining pattern, with areas showing a  strong 3+ staining pattern, and other regions with complete absence (0+)  of staining.   Ki-67: Not performed   CASE SUMMARY: BREAST BIOMARKER TESTS - B - RIGHT AXILLARY LYMPH NODE  Estrogen Receptor (ER) Status: POSITIVE          Percentage of cells with nuclear positivity: 81-90%          Average intensity of staining: Strong   Progesterone Receptor (PgR) Status: POSITIVE          Percentage of cells with nuclear positivity: 51-60%  Average intensity of staining: Strong   HER2 (by immunohistochemistry): NEGATIVE (Score 1+)  Ki-67: Not performed    Carcinoma of upper-outer quadrant of right breast in female, estrogen receptor positive ([place])  10/26/2021 Initial Diagnosis   Carcinoma of upper-outer quadrant of right breast in female, estrogen receptor positive ([place])   10/26/2021 Cancer Staging   Staging form: Breast, AJCC 8th Edition - Clinical: Stage IB (cT2, cN1, cM0, G2, ER+, PR+, HER2+) - Signed by Cammie Sickle, MD on 10/26/2021 Histologic grading system: 3 grade system    10/26/2021 -  Chemotherapy   Patient is on Treatment Plan : BREAST  Docetaxel + Carboplatin + Trastuzumab + Pertuzumab  (TCHP) q21d          HISTORY OF PRESENTING ILLNESS: Patient is ambulating independently.  Accompanied by her husband.  Marilyn Page 46 y.o.  female female with no prior history of breast cancer/or malignancies has been referred to [place] for further evaluation recommendations for new  diagnosis of breast cancer.  Of note patient had previously seen me for iron deficient anemia secondary to menorrhagia.  Patient states palpable lump in the right breast which led to further diagnostic imaging including mammogram/ultrasound followed by biopsy-as summarized above.  Family history of breast cancer:none  Family history of other cancers: mother ovarian tumor [1990- no chemo]; skin cancer- ? Needed RT [? 1448]  Menopause: years old- currently on menstrual cycles.   Used estrogen and progesterone therapy: none; On BCPs- for menstrual regularity.   Previous biopsy: none   Review of Systems  Constitutional:  Negative for chills, diaphoresis, fever, malaise/fatigue and weight loss.  HENT:  Negative for nosebleeds and sore throat.   Eyes:  Negative for double vision.  Respiratory:  Negative for cough, hemoptysis, sputum production, shortness of breath and wheezing.   Cardiovascular:  Negative for chest pain, palpitations, orthopnea and leg swelling.  Gastrointestinal:  Negative for abdominal pain, blood in stool, constipation, diarrhea, heartburn, melena, nausea and vomiting.  Genitourinary:  Negative for dysuria, frequency and urgency.  Musculoskeletal:  Negative for back pain and joint pain.  Skin: Negative.  Negative for itching and rash.  Neurological:  Negative for dizziness, tingling, focal weakness, weakness and headaches.  Endo/Heme/Allergies:  Does not bruise/bleed easily.  Psychiatric/Behavioral:  Negative for depression. The patient is not nervous/anxious and does not have insomnia.     MEDICAL HISTORY:  Past Medical History:  Diagnosis Date   Carcinoma of upper-outer quadrant of right breast in female, estrogen receptor positive ([place]) 10/26/2021   Diabetes mellitus without complication ([place])    History of gestational diabetes     SURGICAL HISTORY: Past Surgical History:  Procedure Laterality Date   BREAST BIOPSY Right 10/19/2021   [place] Core bx 10:00 6 cmfn  Ribbon Clip. Axilla Butterfly hydro clip- path pending   CESAREAN SECTION  2005/2008   DILATION AND CURETTAGE OF UTERUS  2003    SOCIAL HISTORY: Social History   Socioeconomic History   Marital status: Married    Spouse name: Not on file   Number of children: 2   Years of education: Not on file   Highest education level: Not on file  Occupational History   Occupation: and Optometrist  Tobacco Use   Smoking status: Never   Smokeless tobacco: Never  Vaping Use   Vaping Use: Never used  Substance and Sexual Activity   Alcohol use: Never   Drug use: Never   Sexual activity: Yes    Birth control/protection: Other-see comments,  Surgical    Comment: vasectomy  Other Topics Concern   Not on file  Social History Narrative   Pre-school teacher; in [place]; 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    Social Determinants of Health   Financial Resource Strain: Not on file  Food Insecurity: Not on file  Transportation Needs: Not on file  Physical Activity: Not on file  Stress: Not on file  Social Connections: Not on file  Intimate Partner Violence: Not on file    FAMILY HISTORY: Family History  Problem Relation Age of Onset   Ovarian cysts Mother    Migraines Mother    Hyperlipidemia Father    Diabetes Maternal Aunt    Diabetes Maternal Grandfather    Ovarian cancer Cousin 20       maternal 1st cousin-now in her 1s   Breast cancer Neg Hx     ALLERGIES:  is allergic to metformin.  MEDICATIONS:  Current Outpatient Medications  Medication Sig Dispense Refill   Alcohol Swabs (ALCOHOL WIPES) 70 % PADS Apply topically.     Cholecalciferol 50 MCG (2000 UT) CAPS Take by mouth.     ferrous sulfate 325 (65 FE) MG tablet Take 325 mg by mouth 2 (two) times daily with a meal.     lidocaine-prilocaine (EMLA) cream Apply on the port. 30 -45 min  prior to port access. 30 g 3   norethindrone (MICRONOR) 0.35 MG tablet Take 1 tablet (0.35 mg total) by mouth daily. 84 tablet 2   ondansetron  (ZOFRAN-ODT) 8 MG disintegrating tablet Take 1 tablet (8 mg total) by mouth every 8 (eight) hours as needed for nausea or vomiting. 45 tablet 1   ONETOUCH VERIO test strip SMARTSIG:1 Each Via Meter Daily PRN     OZEMPIC, 0.25 OR 0.5 MG/DOSE, 2 MG/1.5ML SOPN PLEASE SEE ATTACHED FOR DETAILED DIRECTIONS     prochlorperazine (COMPAZINE) 10 MG tablet Take 1 tablet (10 mg total) by mouth every 6 (six) hours as needed for nausea or vomiting. 40 tablet 1   No current facility-administered medications for this visit.      Marland Kitchen  PHYSICAL EXAMINATION: ECOG PERFORMANCE STATUS: 0 - Asymptomatic  Vitals:   10/26/21 1426  BP: 130/73  Pulse: 78  Temp: 98.8 F (37.1 C)  SpO2: 99%   Filed Weights   10/26/21 1426  Weight: 226 lb 9.6 oz (102.8 kg)   Right breast-10:00 vague 3 to 4 cm mass noted-with skin thickening/ ?  Biopsy changes.  No nipple changes no erythema.   Physical Exam Vitals and nursing note reviewed.  HENT:     Head: Normocephalic and atraumatic.     Mouth/Throat:     Pharynx: Oropharynx is clear.  Eyes:     Extraocular Movements: Extraocular movements intact.     Pupils: Pupils are equal, round, and reactive to light.  Cardiovascular:     Rate and Rhythm: Normal rate and regular rhythm.  Pulmonary:     Comments: Decreased breath sounds bilaterally.  Abdominal:     Palpations: Abdomen is soft.  Musculoskeletal:        General: Normal range of motion.     Cervical back: Normal range of motion.  Skin:    General: Skin is warm.  Neurological:     General: No focal deficit present.     Mental Status: She is alert and oriented to person, place, and time.  Psychiatric:        Behavior: Behavior normal.        Judgment: Judgment  normal.     LABORATORY DATA:  I have reviewed the data as listed Lab Results  Component Value Date   WBC 9.1 10/26/2021   HGB 12.9 10/26/2021   HCT 39.1 10/26/2021   MCV 85.6 10/26/2021   PLT 298 10/26/2021   Recent Labs    05/11/21 1225  10/26/21 1606  NA 139 139  K 3.5 3.5  CL 104 105  CO2 26 30  GLUCOSE 115* 119*  BUN 11 10  CREATININE 0.71 0.77  CALCIUM 8.6* 8.7*  GFRNONAA >60 >60  PROT 7.2 7.7  ALBUMIN 3.7 3.8  AST 20 19  ALT 24 22  ALKPHOS 51 56  BILITOT 0.2* 0.3    RADIOGRAPHIC STUDIES: I have personally reviewed the radiological images as listed and agreed with the findings in the report. US BREAST LTD UNI RIGHT INC AXILLA  Result Date: 10/01/2021 CLINICAL DATA:  Patient presents for palpable mass within the right breast. EXAM: DIGITAL DIAGNOSTIC BILATERAL MAMMOGRAM WITH TOMOSYNTHESIS AND CAD; ULTRASOUND RIGHT BREAST LIMITED TECHNIQUE: Bilateral digital diagnostic mammography and breast tomosynthesis was performed. The images were evaluated with computer-aided detection.; Targeted ultrasound examination of the right breast was performed COMPARISON:  Previous exam(s). ACR Breast Density Category d: The breast tissue is extremely dense, which lowers the sensitivity of mammography. FINDINGS: Underlying the palpable marker within the right breast is an irregular mass. There is new skin thickening overlying the right breast. No additional masses, calcifications or distortion identified within either breast. On physical exam, there is a firm palpable mass within the upper-outer right breast. Targeted ultrasound is performed, showing a 2.9 x 1.9 x 2.0 cm irregular hypoechoic mass right breast 10 o'clock position 6 cm from nipple at the site of palpable concern. There is an abnormal 8 mm thickened right axillary lymph node. There is skin thickening overlying the right breast. IMPRESSION: Suspicious palpable right breast mass 10 o'clock position. Cortically thickened abnormal right axillary lymph node. Skin thickening throughout the right breast. RECOMMENDATION: Ultrasound-guided core needle biopsy palpable right breast mass 10 o'clock position. Ultrasound-guided core needle biopsy right axillary lymph node. Recommend bilateral  breast MRI after biopsies for further evaluation of extent as well as for further evaluation of the left breast. I have discussed the findings and recommendations with the patient. If applicable, a reminder letter will be sent to the patient regarding the next appointment. BI-RADS CATEGORY  5: Highly suggestive of malignancy. Electronically Signed   By: Lovey Newcomer M.D.   On: 10/01/2021 15:15  MM DIAG BREAST TOMO BILATERAL  Result Date: 10/01/2021 CLINICAL DATA:  Patient presents for palpable mass within the right breast. EXAM: DIGITAL DIAGNOSTIC BILATERAL MAMMOGRAM WITH TOMOSYNTHESIS AND CAD; ULTRASOUND RIGHT BREAST LIMITED TECHNIQUE: Bilateral digital diagnostic mammography and breast tomosynthesis was performed. The images were evaluated with computer-aided detection.; Targeted ultrasound examination of the right breast was performed COMPARISON:  Previous exam(s). ACR Breast Density Category d: The breast tissue is extremely dense, which lowers the sensitivity of mammography. FINDINGS: Underlying the palpable marker within the right breast is an irregular mass. There is new skin thickening overlying the right breast. No additional masses, calcifications or distortion identified within either breast. On physical exam, there is a firm palpable mass within the upper-outer right breast. Targeted ultrasound is performed, showing a 2.9 x 1.9 x 2.0 cm irregular hypoechoic mass right breast 10 o'clock position 6 cm from nipple at the site of palpable concern. There is an abnormal 8 mm thickened right axillary lymph node. There is  skin thickening overlying the right breast. IMPRESSION: Suspicious palpable right breast mass 10 o'clock position. Cortically thickened abnormal right axillary lymph node. Skin thickening throughout the right breast. RECOMMENDATION: Ultrasound-guided core needle biopsy palpable right breast mass 10 o'clock position. Ultrasound-guided core needle biopsy right axillary lymph node. Recommend  bilateral breast MRI after biopsies for further evaluation of extent as well as for further evaluation of the left breast. I have discussed the findings and recommendations with the patient. If applicable, a reminder letter will be sent to the patient regarding the next appointment. BI-RADS CATEGORY  5: Highly suggestive of malignancy. Electronically Signed   By: Lovey Newcomer M.D.   On: 10/01/2021 15:15  [place] AXILLARY NODE CORE BIOPSY RIGHT  Addendum Date: 10/21/2021   ADDENDUM REPORT: 10/21/2021 09:35 ADDENDUM: Pathology revealed GRADE II INVASIVE MAMMARY CARCINOMA, NO SPECIAL TYPE, INTERMEDIATE GRADE DUCTAL CARCINOMA IN SITU of the RIGHT breast, 10 o'clock, 6 cmfn (ribbon clip). This was found to be concordant by Dr. Ammie Ferrier. Pathology revealed 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 IN GREATEST EXTENT of the RIGHT axillary lymph node (HydroMARK butterfly-shaped clip). This was found to be concordant by Dr. Ammie Ferrier. Pathology results were discussed with the patient by telephone. The patient reported doing well after the biopsies with tenderness at the sites. Post biopsy instructions and care were reviewed and questions were answered. The patient was encouraged to call [facility] for any additional concerns. Recommendations: Breast MRI for extent of disease per diagnostic report given dense breast tissue and premenopausal age. Also, note that there are at least 3 abnormal lymph nodes that were found in the right axilla at the time of biopsy. MRI may help reassess the axilla. Biopsy results called to Patent examiner at [facility] on 10/21/2021 and sent secure email to [facility] Nurse Oncology Navigator 10/20/2021. Pathology results reported by Stacie Acres RN on 10/21/2021. Electronically Signed   By: Ammie Ferrier M.D.   On: 10/21/2021 09:35   Result Date: 10/21/2021 CLINICAL DATA:  46 year old female  presenting for ultrasound-guided biopsy of a right breast mass and right axillary lymph node. EXAM: ULTRASOUND GUIDED RIGHT BREAST CORE NEEDLE BIOPSY COMPARISON:  None Available. PROCEDURE: I met with the patient and we discussed the procedure of ultrasound-guided biopsy, including benefits and alternatives. We discussed the high likelihood of a successful procedure. We discussed the risks of the procedure, including infection, bleeding, tissue injury, clip migration, and inadequate sampling. Informed written consent was given. The usual time-out protocol was performed immediately prior to the procedure. Lesion quadrant: Upper outer quadrant Using sterile technique and 1% Lidocaine as local anesthetic, under direct ultrasound visualization, a 14 gauge spring-loaded device was used to perform biopsy of a mass in the right breast at 10 o'clock, 6 cm from the nipple using an inferior approach. At the conclusion of the procedure a ribbon shaped tissue marker clip was deployed into the biopsy cavity. Lesion quadrant: Right axilla Upon scanning in preparation for the biopsy, I noted at least 3 level 1 lymph nodes clustered together that appear abnormal. I biopsied one of the lymph nodes in this cluster. Using sterile technique and 1% Lidocaine as local anesthetic, under direct ultrasound visualization, a 14 gauge spring-loaded device was used to perform biopsy of a right axillary lymph node using an inferior approach. At the conclusion of the procedure HydroMARK butterfly shaped tissue marker clip was deployed into the biopsy cavity. Follow up 2 view  mammogram was performed and dictated separately. IMPRESSION: 1. Ultrasound guided biopsy of a mass in the right breast at 10 o'clock. No apparent complications. 2. Ultrasound-guided biopsy of a right axillary lymph node. No apparent complications. Note that there are at least 3 clustered level 1 abnormal lymph nodes. Electronically Signed: By: Ammie Ferrier M.D. On:  10/19/2021 09:16   MM CLIP PLACEMENT RIGHT  Result Date: 10/19/2021 CLINICAL DATA:  Post biopsy mammogram of the right breast for clip placement. EXAM: 3D DIAGNOSTIC RIGHT MAMMOGRAM POST ULTRASOUND BIOPSY COMPARISON:  Previous exam(s). FINDINGS: 3D Mammographic images were obtained following ultrasound guided biopsy of a right breast mass and right axillary lymph node. The biopsy marking clip is in expected position at the site of biopsy. The ribbon shaped biopsy marking clip is placed along the anterior margin of the biopsied mass. IMPRESSION: 1. Appropriate positioning of the ribbon shaped biopsy marking clip at the site of biopsy in the upper-outer right breast. 2. Appropriate positioning of the [facility],The shaped biopsy marking clip in the right axilla. Final Assessment: Post Procedure Mammograms for Marker Placement Electronically Signed   By: Ammie Ferrier M.D.   On: 10/19/2021 09:23  [place] RT BREAST BX W LOC DEV 1ST LESION IMG BX SPEC US GUIDE  Addendum Date: 10/21/2021   ADDENDUM REPORT: 10/21/2021 09:35 ADDENDUM: Pathology revealed GRADE II INVASIVE MAMMARY CARCINOMA, NO SPECIAL TYPE, INTERMEDIATE GRADE DUCTAL CARCINOMA IN SITU of the RIGHT breast, 10 o'clock, 6 cmfn (ribbon clip). This was found to be concordant by Dr. Ammie Ferrier. Pathology revealed 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 IN GREATEST EXTENT of the RIGHT axillary lymph node (HydroMARK butterfly-shaped clip). This was found to be concordant by Dr. Ammie Ferrier. Pathology results were discussed with the patient by telephone. The patient reported doing well after the biopsies with tenderness at the sites. Post biopsy instructions and care were reviewed and questions were answered. The patient was encouraged to call [facility] for any additional concerns. Recommendations: Breast MRI for extent of disease per diagnostic report given dense breast tissue and  premenopausal age. Also, note that there are at least 3 abnormal lymph nodes that were found in the right axilla at the time of biopsy. MRI may help reassess the axilla. Biopsy results called to Patent examiner at [facility] on 10/21/2021 and sent secure email to [facility] Nurse Oncology Navigator 10/20/2021. Pathology results reported by Stacie Acres RN on 10/21/2021. Electronically Signed   By: Ammie Ferrier M.D.   On: 10/21/2021 09:35   Result Date: 10/21/2021 CLINICAL DATA:  46 year old female presenting for ultrasound-guided biopsy of a right breast mass and right axillary lymph node. EXAM: ULTRASOUND GUIDED RIGHT BREAST CORE NEEDLE BIOPSY COMPARISON:  None Available. PROCEDURE: I met with the patient and we discussed the procedure of ultrasound-guided biopsy, including benefits and alternatives. We discussed the high likelihood of a successful procedure. We discussed the risks of the procedure, including infection, bleeding, tissue injury, clip migration, and inadequate sampling. Informed written consent was given. The usual time-out protocol was performed immediately prior to the procedure. Lesion quadrant: Upper outer quadrant Using sterile technique and 1% Lidocaine as local anesthetic, under direct ultrasound visualization, a 14 gauge spring-loaded device was used to perform biopsy of a mass in the right breast at 10 o'clock, 6 cm from the nipple using an inferior approach. At the conclusion of the procedure a ribbon shaped tissue marker clip was deployed into the  biopsy cavity. Lesion quadrant: Right axilla Upon scanning in preparation for the biopsy, I noted at least 3 level 1 lymph nodes clustered together that appear abnormal. I biopsied one of the lymph nodes in this cluster. Using sterile technique and 1% Lidocaine as local anesthetic, under direct ultrasound visualization, a 14 gauge spring-loaded device was used to perform biopsy of a right axillary lymph node using an inferior  approach. At the conclusion of the procedure HydroMARK butterfly shaped tissue marker clip was deployed into the biopsy cavity. Follow up 2 view mammogram was performed and dictated separately. IMPRESSION: 1. Ultrasound guided biopsy of a mass in the right breast at 10 o'clock. No apparent complications. 2. Ultrasound-guided biopsy of a right axillary lymph node. No apparent complications. Note that there are at least 3 clustered level 1 abnormal lymph nodes. Electronically Signed: By: Ammie Ferrier M.D. On: 10/19/2021 09:16    ASSESSMENT & PLAN:   Carcinoma of upper-outer quadrant of right breast in female, estrogen receptor positive ([place]) #T2N1 ER/PR positive HER2 POSITIVE right breast cancer; lymph node positive-ER/PR positive however 2 NEGATIVE s/p biopsy [see discussion below]  #Given the discrepancy in morphology/HER2 status between primary tumor and lymph node-I would recommend further evaluation with bilateral breast MRI.  Also recommend a PET scan to rule out any distant metastatic disease.   #Given HER2 positive disease; T > 2 cm; positive lymphadenopathy-would recommend neoadjuvant chemotherapy.  I would recommend TCH plus P x6 cycles on neoadjuvant basis.  This will be followed by surgery.  Radiation could be considered based on patient's postsurgical pathology.  Will benefit from adjuvant Her-2 blocking therapy for total of 1 year.  Patient would also benefit from endocrine therapy 5 to 10 years.   # Chemotherapy education; port placement. Hopefully the planned start chemotherapy in week of June 5th.  Antiemetics-Zofran and Compazine; EMLA cream sent to pharmacy. MUGA scan to check heart function.   # Borderline diabetes- on Ozempic.  Need to monitor blood sugars closely on chemotherapy.  # Genetic counseling: Given family history of ?  Ovarian cancer young age of presentation-we will recommend genetic counseling.  We will make a referral.   # DISPOSITION: # Bil breast MRI  ASAP # PET scan ASAP #  MUGA scan ASAP # genetic counseling re: breast cancer  # labs- today; cbc/cmp;CEA; ca-27-29; ca 15-3- please order # chemo education- TCHP+P  # Follow up June 6th MD; labs- cbc/cmp ;Taxotere+ Caboplatin+herceptin+Perjeta; D-2 Ellen Henri- Dr.B    All questions were answered. The patient/family knows to call the clinic with any problems, questions or concerns.       Cammie Sickle, MD 10/26/2021 4:36 PM

## 2021-10-27 LAB — CANCER ANTIGEN 15-3: CA 15-3: 10.4 U/mL (ref 0.0–25.0)

## 2021-10-27 LAB — CEA: CEA: 1.3 ng/mL (ref 0.0–4.7)

## 2021-10-27 LAB — CANCER ANTIGEN 27.29: CA 27.29: 13.5 U/mL (ref 0.0–38.6)

## 2021-10-29 ENCOUNTER — Encounter: Payer: Self-pay | Admitting: Internal Medicine

## 2021-10-29 ENCOUNTER — Inpatient Hospital Stay: Payer: 59

## 2021-10-29 NOTE — Progress Notes (Signed)
I spoke with patient regarding plan for skin biopsy at the time of port placement. Discussed with dr. Tollie Pizza. Reviewed the bloodwork with the patient.

## 2021-10-31 ENCOUNTER — Other Ambulatory Visit: Payer: Self-pay | Admitting: General Surgery

## 2021-10-31 NOTE — Progress Notes (Signed)
Progress Notes - documented in this encounter Marilyn Page, Marilyn Boot, MD - 10/27/2021 4:00 PM EDT Formatting of this note is different from the original. Images from the original note were not included. Subjective:   Patient ID: Marilyn Page is a 46 y.o. female.  HPI  The following portions of the patient's history were reviewed and updated as appropriate.  This a new patient is here today for: office visit. Here to discuss treatment options for right breast cancer referred by Marilyn Perfect PA She found a right breast mass at the beginning of the month. She had previously been identified with a breast cyst in late 2021  The patient is a Print production planner. Her husband is a Agricultural consultant for the city of [place]. Her sister-in-law is a retired Media planner.  She is here with her husband, Marilyn Page and sister in law Marilyn Page.  Review of Systems  Constitutional: Negative for chills and fever.  Respiratory: Negative for cough.   Chief Complaint  Patient presents with   New Patient    BP 118/76  Pulse 79  Temp 36.8 C (98.2 F)  Ht 167.6 cm (5' 6" )  Wt (!) 102.1 kg (225 lb)  LMP 10/04/2021  SpO2 98%  BMI 36.32 kg/m   Past Medical History:  Diagnosis Date   Breast cancer (CMS-HCC) 10/19/2021  INVASIVE MAMMARY CARCINOMA,   History of gestational diabetes 10/03/2017   S/P lymph node biopsy 10/19/2021  right. 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 IN GREATEST    Past Surgical History:  Procedure Laterality Date   DILATION AND CURETTAGE, DIAGNOSTIC / THERAPEUTIC 2003   BREAST EXCISIONAL BIOPSY Right 10/19/2021   CESAREAN SECTION  2005, 2008    OB History  Gravida  4  Para  2  Term   Preterm   AB  1  Living    SAB  1  IAB   Ectopic   Molar   Multiple   Live Births     Obstetric Comments  Age at first period 59 Age of first pregnancy 54     Social History   Socioeconomic History   Marital status: Married   Tobacco Use   Smoking status: Never  Passive exposure: Never   Smokeless tobacco: Never  Substance and Sexual Activity   Alcohol use: Never   Drug use: Never    Allergies  Allergen Reactions   Metformin Other (See Comments) and Muscle Pain   Current Outpatient Medications  Medication Sig Dispense Refill   alcohol swabs PadM Apply 1 each topically once daily And as needed Before using lancet to prick finger 200 each 1   blood glucose meter kit as directed 1 each 0   cholecalciferol (VITAMIN D3) 2,000 unit capsule Take 1 capsule (2,000 Units total) by mouth once daily 360 capsule 11   ferrous sulfate 325 (65 FE) MG tablet Take 1 tablet (325 mg total) by mouth 2 (two) times daily with meals 180 tablet 3   lancets Use 1 each once daily And as needed 150 each 12   lidocaine-prilocaine (EMLA) cream   norethindrone (MICRONOR) 0.35 mg tablet Take 1 tablet (0.35 mg total) by mouth once daily   ondansetron (ZOFRAN-ODT) 8 MG disintegrating tablet Take 8 mg by mouth every 8 (eight) hours as needed   ONETOUCH VERIO TEST STRIPS test strip 1 EACH (1 STRIP TOTAL) ONCE DAILY AND AS NEEDED 100 strip 12   prochlorperazine (COMPAZINE) 10 MG tablet Take 10 mg by mouth every 6 (six)  hours as needed   semaglutide (OZEMPIC) 1 mg/dose (4 mg/3 mL) pen injector Inject 0.75 mLs (1 mg total) subcutaneously once a week 9 mL 1   norethindrone (MICRONOR) 0.35 mg tablet Take by mouth (Patient not taking: Reported on 10/27/2021)   No current facility-administered medications for this visit.   Family History  Problem Relation Age of Onset   Migraines Mother   Ovarian cysts Mother   Hyperlipidemia (Elevated cholesterol) Father   Diabetes Maternal Grandfather   Diabetes Maternal Aunt   Ovarian cancer Cousin  maternal   Breast cancer Neg Hx   Colon cancer Neg Hx   Labs and Radiology:   Oct 19, 2021 right breast and axillary biopsy:  A. BREAST, RIGHT AT 10:00, 6 CM FROM THE NIPPLE; ULTRASOUND-GUIDED CORE   NEEDLE BIOPSY:  - INVASIVE MAMMARY CARCINOMA, NO SPECIAL TYPE.   Size of invasive carcinoma: 11 mm in this sample  Histologic grade of invasive carcinoma: Grade 2                       Glandular/tubular differentiation score: 3                       Nuclear pleomorphism score: 2                       Mitotic rate score: 1                       Total score: 6  Ductal carcinoma in situ: Present, intermediate grade  Lymphovascular invasion: Not identified  Estrogen Receptor (ER) Status: POSITIVE          Percentage of cells with nuclear positivity: 71-80%          Average intensity of staining: Strong   Progesterone Receptor (PgR) Status: POSITIVE          Percentage of cells with nuclear positivity: 81-90%          Average intensity of staining: Strong   HER2 (by immunohistochemistry): POSITIVE (Score 3+)       Percentage of cells with uniform intense complete membrane  staining: 61-70%  HER2 displays a heterogeneous staining pattern, with areas showing a  strong 3+ staining pattern, and other regions with complete absence (0+)  of staining.   B. LYMPH NODE, RIGHT AXILLARY; ULTRASOUND-GUIDED CORE NEEDLE BIOPSY:  - 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 IN GREATEST  EXTENT.   Comment:  The malignancy in the primary breast lesion appears morphologically  different from tumor in the lymph node sampling, with tumor present in  lymph node more suggestive of a histologic grade 3, with significantly  increased mitotic activity and pleomorphism. CASE SUMMARY: BREAST BIOMARKER TESTS - B - RIGHT AXILLARY LYMPH NODE  Estrogen Receptor (ER) Status: POSITIVE          Percentage of cells with nuclear positivity: 81-90%          Average intensity of staining: Strong   Progesterone Receptor (PgR) Status: POSITIVE          Percentage of cells with nuclear positivity: 51-60%          Average intensity of staining: Strong   HER2 (by immunohistochemistry): NEGATIVE (Score  1+)   Imaging review:  Mammograms and associated ultrasounds from Oct 19, 2021 through November 09, 2017 were independently reviewed.  Right breast cyst identified January 2022, 930 o'clock, 6 cm  from the nipple.  2023 mammogram show a irregular mass in the 10 o'clock position, 6 cm from the nipple. Ultrasound maximum diameter 2.9 cm. Skin thickening is reported new from 2022 although not clearly evident on my review of the films.  September 14, 2021 PCP note:  Suggest possible skin dimpling without erythema, with a 4 x 5 cm firm mass in the upper outer quadrant of the right breast.  Medical oncology note Oct 26, 2021: Reviewed.  Objective:  Physical Exam Exam conducted with a chaperone present.  Constitutional:  Appearance: Normal appearance.  Cardiovascular:  Rate and Rhythm: Normal rate and regular rhythm.  Pulses: Normal pulses.  Heart sounds: Normal heart sounds.  Pulmonary:  Effort: Pulmonary effort is normal.  Breath sounds: Normal breath sounds.  Chest:  Breasts: Right: Mass and skin change (suggestion of peau d'orange changes in the upper ,lateral breast, possibly related to prior biopsy) present.  Left: Normal.   Comments: 6 cm plus area of ecchymosis around the recently completed right breast biopsy site with no defined palpable mass within that area. Enlarged axillary node at the mid level of the axilla on the right. Musculoskeletal:  Cervical back: Neck supple.  Lymphadenopathy:  Upper Body:  Right upper body: Axillary adenopathy present. No supraclavicular adenopathy.  Left upper body: No supraclavicular or axillary adenopathy.  Skin: General: Skin is warm and dry.  Neurological:  Mental Status: She is alert and oriented to person, place, and time.  Psychiatric:  Mood and Affect: Mood normal.  Behavior: Behavior normal.    Assessment:   Dramatic presentation of a large breast mass just 14 months after her earlier mammogram.  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  Need for central venous access.  Plan:   Port indication and placement procedure was reviewed with the patient and her family. Risks associated with bleeding and injury to the lung were discussed. She has very modest peripheral venous access and this port placement will be important for her to get her treatments on schedule.  Candidate for genetic testing, previously discussed by medical oncology.  Pros and cons of breast MRI reviewed, and she was advised that it is very frequent, probably 20% of the time, that an incidental finding will require biopsy but that he is acceptable.  On further review of her images after the patient left I think should be a candidate for tattooing of the enlarged axillary node at the time of port placement. The staff will contact the patient in this regard.  Port placement is planned for November 09, 2021.    This note is partially prepared by Karie Fetch, RN, acting as a scribe in the presence of Dr. Hervey Ard, MD.  The documentation recorded by the scribe accurately reflects the service I personally performed and the decisions made by me.   Robert Bellow, MD FACS

## 2021-11-03 ENCOUNTER — Telehealth: Payer: Self-pay | Admitting: Internal Medicine

## 2021-11-03 NOTE — Telephone Encounter (Signed)
Reviewed with the patient the results of the blood work including-tumor markers negative.  Await further imaging.  Also discussed regarding breast/skin biopsy with Dr. Bary Castilla at the time of port placement.  There is no change of plan of care at this time.

## 2021-11-04 ENCOUNTER — Telehealth: Payer: Self-pay

## 2021-11-04 ENCOUNTER — Ambulatory Visit
Admission: RE | Admit: 2021-11-04 | Discharge: 2021-11-04 | Disposition: A | Discharge status: Home / Self Care | Payer: 59 | Source: Ambulatory Visit | Attending: Internal Medicine | Admitting: Internal Medicine

## 2021-11-04 ENCOUNTER — Encounter: Payer: Self-pay | Admitting: Radiology

## 2021-11-04 DIAGNOSIS — C50411 Malignant neoplasm of upper-outer quadrant of right female breast: Secondary | ICD-10-CM | POA: Diagnosis present

## 2021-11-04 DIAGNOSIS — Z170 Estrogen receptor positive status [ER+]: Secondary | ICD-10-CM | POA: Insufficient documentation

## 2021-11-04 MED ORDER — TECHNETIUM TC 99M-LABELED RED BLOOD CELLS IV KIT
20.0000 | PACK | Freq: Once | INTRAVENOUS | Status: AC
  Administered 2021-11-04: 21.24 via INTRAVENOUS

## 2021-11-04 NOTE — Telephone Encounter (Signed)
FMLA form for patient's spouse, Maelani Yarbro, complete and pending MD signature.  Will fax to [place] [employer]--att: Gloris Ham.

## 2021-11-04 NOTE — Progress Notes (Signed)
Pharmacist Chemotherapy Monitoring - Initial Assessment    Anticipated start date: 11/11/21   The following has been reviewed per standard work regarding the patient's treatment regimen: The patient's diagnosis, treatment plan and drug doses, and organ/hematologic function Lab orders and baseline tests specific to treatment regimen  The treatment plan start date, drug sequencing, and pre-medications Prior authorization status  Patient's documented medication list, including drug-drug interaction screen and prescriptions for anti-emetics and supportive care specific to the treatment regimen The drug concentrations, fluid compatibility, administration routes, and timing of the medications to be used The patient's access for treatment and lifetime cumulative dose history, if applicable  The patient's medication allergies and previous infusion related reactions, if applicable   Changes made to treatment plan:  treatment plan date  Follow up needed:  adding baseline muga/echo  for herceptin/perjeta   Adelina Mings, [place], 11/04/2021  11:46 AM

## 2021-11-05 ENCOUNTER — Encounter
Admission: RE | Admit: 2021-11-05 | Discharge: 2021-11-05 | Disposition: A | Discharge status: Home / Self Care | Payer: 59 | Source: Ambulatory Visit | Attending: General Surgery | Admitting: General Surgery

## 2021-11-05 ENCOUNTER — Other Ambulatory Visit: Payer: Self-pay

## 2021-11-05 VITALS — Ht 65.0 in | Wt 221.0 lb

## 2021-11-05 DIAGNOSIS — Z01818 Encounter for other preprocedural examination: Secondary | ICD-10-CM

## 2021-11-05 DIAGNOSIS — E119 Type 2 diabetes mellitus without complications: Secondary | ICD-10-CM

## 2021-11-05 NOTE — Patient Instructions (Addendum)
Your procedure is scheduled on: Monday 11/09/21 Report to the Registration Desk on the 1st floor of the [facility]. To find out your arrival time, please call [phone] between 1PM - 3PM on: Friday 11/06/21 If your arrival time is 6:00 am, do not arrive prior to that time as the [facility] entrance doors do not open until 6:00 am.  REMEMBER: Instructions that are not followed completely may result in serious medical risk, up to and including death; or upon the discretion of your surgeon and anesthesiologist your surgery may need to be rescheduled.  Do not eat food after midnight the night before surgery.  No gum chewing, lozengers or hard candies.  You may however, drink WATER liquids up to 2 hours before you are scheduled to arrive for your surgery. Do not drink anything within 2 hours of your scheduled arrival time.  Type 2 diabetics should only drink water.  TAKE THESE MEDICATIONS THE MORNING OF SURGERY WITH A SIP OF WATER: NONE  One week prior to surgery: Stop Anti-inflammatories (NSAIDS) such as Advil, Aleve, Ibuprofen, Motrin, Naproxen, Naprosyn and Aspirin based products such as Excedrin, Goodys Powder, BC Powder.  Stop taking your Cholecalciferol 50 MCG (2000 UT) CAPS, ANY OVER THE COUNTER supplements until after surgery.  You may however, continue to take Tylenol if needed for pain up until the day of surgery.  No Alcohol for 24 hours before or after surgery.  No Smoking including e-cigarettes for 24 hours prior to surgery.  No chewable tobacco products for at least 6 hours prior to surgery.  No nicotine patches on the day of surgery.  Do not use any "recreational" drugs for at least a week prior to your surgery.  Please be advised that the combination of cocaine and anesthesia may have negative outcomes, up to and including death. If you test positive for cocaine, your surgery will be cancelled.  On the morning of surgery brush your teeth with toothpaste and water,  you may rinse your mouth with mouthwash if you wish. Do not swallow any toothpaste or mouthwash.  Use Dial soap the night prior to surgery and put on clean pajamas and a clean sheet on your bed. Use soap the morning of the surgery also.  Do not wear jewelry, make-up, hairpins, clips or nail polish.  Do not wear lotions, powders, or perfumes.   Do not shave body from the neck down 48 hours prior to surgery just in case you cut yourself which could leave a site for infection.  Also, freshly shaved skin may become irritated if using the CHG soap.  Do not bring valuables to the hospital. [facility] is not responsible for any missing/lost belongings or valuables.   Notify your doctor if there is any change in your medical condition (cold, fever, infection).  Wear comfortable clothing (specific to your surgery type) to the hospital.  After surgery, you can help prevent lung complications by doing breathing exercises.  Take deep breaths and cough every 1-2 hours.   If you are being discharged the day of surgery, you will not be allowed to drive home. You will need a responsible adult (18 years or older) to drive you home and stay with you that night.   If you are taking public transportation, you will need to have a responsible adult (18 years or older) with you. Please confirm with your physician that it is acceptable to use public transportation.   Please call the [facility] at [phone]  if you have any questions about these instructions.  Surgery Visitation Policy:  Patients undergoing a surgery or procedure may have two family members or support persons with them as long as the person is not COVID-19 positive or experiencing its symptoms.   Inpatient Visitation:    Visiting hours are 7 a.m. to 8 p.m. Up to four visitors are allowed at one time in a patient room, including children. The visitors may rotate out with other people during the day. One designated  support person (adult) may remain overnight.

## 2021-11-09 ENCOUNTER — Ambulatory Visit: Payer: 59 | Admitting: Certified Registered"

## 2021-11-09 ENCOUNTER — Other Ambulatory Visit: Payer: Self-pay

## 2021-11-09 ENCOUNTER — Ambulatory Visit
Admission: RE | Admit: 2021-11-09 | Discharge: 2021-11-09 | Disposition: A | Discharge status: Home / Self Care | Payer: 59 | Attending: General Surgery | Admitting: General Surgery

## 2021-11-09 ENCOUNTER — Ambulatory Visit: Payer: 59

## 2021-11-09 ENCOUNTER — Encounter: Payer: Self-pay | Admitting: General Surgery

## 2021-11-09 ENCOUNTER — Encounter
Admission: RE | Disposition: A | Discharge status: Home / Self Care | Payer: Self-pay | Source: Home / Self Care | Attending: General Surgery

## 2021-11-09 DIAGNOSIS — C50911 Malignant neoplasm of unspecified site of right female breast: Secondary | ICD-10-CM | POA: Diagnosis not present

## 2021-11-09 DIAGNOSIS — Z452 Encounter for adjustment and management of vascular access device: Secondary | ICD-10-CM | POA: Insufficient documentation

## 2021-11-09 DIAGNOSIS — Z170 Estrogen receptor positive status [ER+]: Secondary | ICD-10-CM | POA: Diagnosis not present

## 2021-11-09 DIAGNOSIS — Z01818 Encounter for other preprocedural examination: Secondary | ICD-10-CM

## 2021-11-09 DIAGNOSIS — E119 Type 2 diabetes mellitus without complications: Secondary | ICD-10-CM | POA: Insufficient documentation

## 2021-11-09 HISTORY — PX: PORTACATH PLACEMENT: SHX2246

## 2021-11-09 HISTORY — PX: SKIN BIOPSY: SHX1

## 2021-11-09 LAB — POCT PREGNANCY, URINE: Preg Test, Ur: NEGATIVE

## 2021-11-09 LAB — GLUCOSE, CAPILLARY
Glucose-Capillary: 122 mg/dL — ABNORMAL HIGH (ref 70–99)
Glucose-Capillary: 114 mg/dL — ABNORMAL HIGH (ref 70–99)

## 2021-11-09 SURGERY — INSERTION, TUNNELED CENTRAL VENOUS DEVICE, WITH PORT
Anesthesia: General | Site: Chest | Laterality: Right

## 2021-11-09 MED ORDER — MIDAZOLAM HCL 2 MG/2ML IJ SOLN
INTRAMUSCULAR | Status: DC | PRN
  Administered 2021-11-09: 2 mg via INTRAVENOUS

## 2021-11-09 MED ORDER — OXYCODONE HCL 5 MG/5ML PO SOLN: 5.0000 mg | Freq: Once | ORAL | Status: DC | PRN

## 2021-11-09 MED ORDER — LIDOCAINE HCL (PF) 1 % IJ SOLN
INTRAMUSCULAR | Status: AC
  Filled 2021-11-09: qty 30

## 2021-11-09 MED ORDER — PROPOFOL 1000 MG/100ML IV EMUL
INTRAVENOUS | Status: AC
  Filled 2021-11-09: qty 100

## 2021-11-09 MED ORDER — LIDOCAINE HCL (PF) 1 % IJ SOLN
INTRAMUSCULAR | Status: DC | PRN
  Administered 2021-11-09: 13.5 mL

## 2021-11-09 MED ORDER — CHLORHEXIDINE GLUCONATE CLOTH 2 % EX PADS: 6.0000 | MEDICATED_PAD | Freq: Once | CUTANEOUS | Status: DC

## 2021-11-09 MED ORDER — FENTANYL CITRATE (PF) 100 MCG/2ML IJ SOLN: 25.0000 ug | INTRAMUSCULAR | Status: DC | PRN

## 2021-11-09 MED ORDER — MIDAZOLAM HCL 2 MG/2ML IJ SOLN
INTRAMUSCULAR | Status: AC
  Filled 2021-11-09: qty 2

## 2021-11-09 MED ORDER — CHLORHEXIDINE GLUCONATE 0.12 % MT SOLN: 15.0000 mL | Freq: Once | OROMUCOSAL | Status: AC

## 2021-11-09 MED ORDER — CHLORHEXIDINE GLUCONATE CLOTH 2 % EX PADS
6.0000 | MEDICATED_PAD | Freq: Once | CUTANEOUS | Status: AC
  Administered 2021-11-09: 6 via TOPICAL

## 2021-11-09 MED ORDER — PROPOFOL 500 MG/50ML IV EMUL
INTRAVENOUS | Status: DC | PRN
  Administered 2021-11-09: 20 mg via INTRAVENOUS
  Administered 2021-11-09: 150 ug/kg/min via INTRAVENOUS
  Administered 2021-11-09: 60 mg via INTRAVENOUS

## 2021-11-09 MED ORDER — HEPARIN SODIUM (PORCINE) 5000 UNIT/ML IJ SOLN
INTRAMUSCULAR | Status: AC
  Filled 2021-11-09: qty 1

## 2021-11-09 MED ORDER — SODIUM CHLORIDE (PF) 0.9 % IJ SOLN
INTRAMUSCULAR | Status: AC
  Filled 2021-11-09: qty 50

## 2021-11-09 MED ORDER — FENTANYL CITRATE (PF) 100 MCG/2ML IJ SOLN
INTRAMUSCULAR | Status: AC
  Filled 2021-11-09: qty 2

## 2021-11-09 MED ORDER — FAMOTIDINE 20 MG PO TABS
ORAL_TABLET | ORAL | Status: AC
  Administered 2021-11-09: 20 mg via ORAL
  Filled 2021-11-09: qty 1

## 2021-11-09 MED ORDER — SODIUM CHLORIDE (PF) 0.9 % IJ SOLN
INTRAMUSCULAR | Status: DC | PRN
  Administered 2021-11-09: 10 mL

## 2021-11-09 MED ORDER — CEFAZOLIN SODIUM-DEXTROSE 2-4 GM/100ML-% IV SOLN
2.0000 g | INTRAVENOUS | Status: AC
  Administered 2021-11-09: 2 g via INTRAVENOUS

## 2021-11-09 MED ORDER — FAMOTIDINE 20 MG PO TABS: 20.0000 mg | ORAL_TABLET | Freq: Once | ORAL | Status: AC

## 2021-11-09 MED ORDER — LIDOCAINE HCL (CARDIAC) PF 100 MG/5ML IV SOSY
PREFILLED_SYRINGE | INTRAVENOUS | Status: DC | PRN
  Administered 2021-11-09: 50 mg via INTRAVENOUS

## 2021-11-09 MED ORDER — SODIUM CHLORIDE 0.9 % IV SOLN: INTRAVENOUS | Status: DC

## 2021-11-09 MED ORDER — ONDANSETRON HCL 4 MG/2ML IJ SOLN
INTRAMUSCULAR | Status: DC | PRN
  Administered 2021-11-09: 4 mg via INTRAVENOUS

## 2021-11-09 MED ORDER — OXYCODONE HCL 5 MG PO TABS: 5.0000 mg | ORAL_TABLET | Freq: Once | ORAL | Status: DC | PRN

## 2021-11-09 MED ORDER — CEFAZOLIN SODIUM-DEXTROSE 2-4 GM/100ML-% IV SOLN
INTRAVENOUS | Status: AC
  Filled 2021-11-09: qty 100

## 2021-11-09 MED ORDER — ORAL CARE MOUTH RINSE: 15.0000 mL | Freq: Once | OROMUCOSAL | Status: AC

## 2021-11-09 MED ORDER — CHLORHEXIDINE GLUCONATE 0.12 % MT SOLN
OROMUCOSAL | Status: AC
  Administered 2021-11-09: 15 mL via OROMUCOSAL
  Filled 2021-11-09: qty 15

## 2021-11-09 MED ORDER — FENTANYL CITRATE (PF) 100 MCG/2ML IJ SOLN
INTRAMUSCULAR | Status: DC | PRN
  Administered 2021-11-09: 50 ug via INTRAVENOUS

## 2021-11-09 MED ORDER — SPOT INK MARKER SYRINGE KIT
PACK | SUBMUCOSAL | Status: DC | PRN
  Administered 2021-11-09: 1 mL via SUBMUCOSAL

## 2021-11-09 SURGICAL SUPPLY — 35 items
APL PRP STRL LF DISP 70% ISPRP (MISCELLANEOUS) ×2
BAG DECANTER FOR FLEXI CONT (MISCELLANEOUS) ×3 IMPLANT
BLADE SURG 15 STRL SS SAFETY (BLADE) ×3 IMPLANT
CHLORAPREP W/TINT 26 (MISCELLANEOUS) ×3 IMPLANT
COVER LIGHT HANDLE STERIS (MISCELLANEOUS) ×6 IMPLANT
DRAPE C-ARM XRAY 36X54 (DRAPES) ×3 IMPLANT
DRAPE LAPAROTOMY TRNSV 106X77 (MISCELLANEOUS) ×3 IMPLANT
DRSG TEGADERM 2-3/8X2-3/4 SM (GAUZE/BANDAGES/DRESSINGS) ×3 IMPLANT
DRSG TEGADERM 4X4.75 (GAUZE/BANDAGES/DRESSINGS) ×3 IMPLANT
DRSG TELFA 4X3 1S NADH ST (GAUZE/BANDAGES/DRESSINGS) ×3 IMPLANT
ELECT REM PT RETURN 9FT ADLT (ELECTROSURGICAL) ×3
ELECTRODE REM PT RTRN 9FT ADLT (ELECTROSURGICAL) ×2 IMPLANT
GAUZE 4X4 16PLY ~~LOC~~+RFID DBL (SPONGE) ×3 IMPLANT
GLOVE BIO SURGEON STRL SZ7.5 (GLOVE) ×3 IMPLANT
GLOVE BIOGEL PI IND STRL 8 (GLOVE) ×2 IMPLANT
GLOVE BIOGEL PI INDICATOR 8 (GLOVE) ×1
GOWN STRL REUS W/ TWL LRG LVL3 (GOWN DISPOSABLE) ×4 IMPLANT
GOWN STRL REUS W/TWL LRG LVL3 (GOWN DISPOSABLE) ×6
IV NS 500ML (IV SOLUTION) ×3
IV NS 500ML BAXH (IV SOLUTION) ×2 IMPLANT
KIT PORT POWER 8FR ISP CVUE (Port) ×3 IMPLANT
KIT TURNOVER KIT A (KITS) ×3 IMPLANT
LABEL OR SOLS (LABEL) ×3 IMPLANT
MANIFOLD NEPTUNE II (INSTRUMENTS) ×3 IMPLANT
PACK PORT-A-CATH (MISCELLANEOUS) ×3 IMPLANT
SPIKE FLUID TRANSFER (MISCELLANEOUS) ×6 IMPLANT
STRIP CLOSURE SKIN 1/2X4 (GAUZE/BANDAGES/DRESSINGS) ×3 IMPLANT
SUT PROLENE 3 0 SH DA (SUTURE) ×3 IMPLANT
SUT VIC AB 3-0 SH 27 (SUTURE) ×3
SUT VIC AB 3-0 SH 27X BRD (SUTURE) ×2 IMPLANT
SUT VIC AB 4-0 FS2 27 (SUTURE) ×3 IMPLANT
SWABSTK COMLB BENZOIN TINCTURE (MISCELLANEOUS) ×3 IMPLANT
SYR 10ML LL (SYRINGE) ×3 IMPLANT
SYR 10ML SLIP (SYRINGE) ×3 IMPLANT
WATER STERILE IRR 500ML POUR (IV SOLUTION) ×3 IMPLANT

## 2021-11-09 NOTE — Anesthesia Preprocedure Evaluation (Signed)
Anesthesia Evaluation  Patient identified by MRN, date of birth, ID band Patient awake    Reviewed: Allergy & Precautions, NPO status , Patient's Chart, lab work & pertinent test results  History of Anesthesia Complications (+) PROLONGED EMERGENCE and history of anesthetic complications  Airway Mallampati: III  TM Distance: <3 FB Neck ROM: full    Dental  (+) Chipped   Pulmonary neg pulmonary ROS, neg shortness of breath,    Pulmonary exam normal        Cardiovascular Exercise Tolerance: Good (-) angina(-) Past MI and (-) DOE negative cardio ROS Normal cardiovascular exam     Neuro/Psych negative neurological ROS  negative psych ROS   GI/Hepatic negative GI ROS, Neg liver ROS,   Endo/Other  diabetes, Type 2  Renal/GU negative Renal ROS  negative genitourinary   Musculoskeletal   Abdominal   Peds  Hematology negative hematology ROS (+)   Anesthesia Other Findings Past Medical History: 10/26/2021: Carcinoma of upper-outer quadrant of right breast in  female, estrogen receptor positive ([place]) No date: Diabetes mellitus without complication ([place]) No date: History of gestational diabetes  Past Surgical History: 10/19/2021: BREAST BIOPSY; Right     Comment:  [place] Core bx 10:00 6 cmfn Ribbon Clip. Axilla Butterfly               hydro clip- path pending 2005/2008: CESAREAN SECTION 2003: DILATION AND CURETTAGE OF UTERUS  BMI    Body Mass Index: 36.78 kg/m      Reproductive/Obstetrics negative OB ROS                             Anesthesia Physical Anesthesia Plan  ASA: 3  Anesthesia Plan: General   Post-op Pain Management:    Induction: Intravenous  PONV Risk Score and Plan: Propofol infusion and TIVA  Airway Management Planned: Natural Airway and Nasal Cannula  Additional Equipment:   Intra-op Plan:   Post-operative Plan:   Informed Consent: I have reviewed the patients  History and Physical, chart, labs and discussed the procedure including the risks, benefits and alternatives for the proposed anesthesia with the patient or authorized representative who has indicated his/her understanding and acceptance.     Dental Advisory Given  Plan Discussed with: Anesthesiologist, CRNA and Surgeon  Anesthesia Plan Comments: (Patient consented for risks of anesthesia including but not limited to:  - adverse reactions to medications - risk of airway placement if required - damage to eyes, teeth, lips or other oral mucosa - nerve damage due to positioning  - sore throat or hoarseness - Damage to heart, brain, nerves, lungs, other parts of body or loss of life  Patient voiced understanding.)        Anesthesia Quick Evaluation

## 2021-11-09 NOTE — Op Note (Addendum)
Preoperative diagnosis: Right breast cancer, need for central venous access.  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  Operating surgeon: Hervey Ard, MD.  Anesthesia: Monitored anesthesia, 1% Xylocaine, plain 14 cc.  Estimated blood loss: Less than 2 cc.  Clinical note: This 46 year old woman was recently diagnosed with stage II breast cancer.  She is a candidate for neoadjuvant chemotherapy.  Central venous access was requested by her treating oncologist.  There is skin thickening and the concern for possible inflammatory cancer.  Skin biopsy is planned.  To help locate the previously biopsied lymph node at the time of surgery 6+ months from now it was elected to had to the right axillary node.  Operative note: With the patient under adequate sedation the breast chest and neck was cleansed with ChloraPrep and draped.  Ultrasound was used to confirm patency of the subclavian vein on the left.  The vein was cannulated on a single stick followed by passage of the guidewire, dilator and catheter.  This was placed near the junction of the SVC and right atrium.  The catheter was tunneled to the previously created pocket on the left chest.  Port was attached and transfixed to redo tissue with 3-0 Prolene sutures x2.  The adipose layer was closed with a running 3-0 Vicryl suture, skin closed with a running 4-0 Vicryl subcuticular suture.  Benzoin and Steri-Strips were applied.  The port was cannulated and easily aspirated.  It was flushed with 10 cc of injectable saline, both clamps and Applied and a Tegaderm dressing placed.  The thickened skin on the right chest wall was biopsied with a 3 mm punch biopsy x1.  Skin defect was closed with a single  Steri-Strip.  Ultrasound was used to identify the area of prior right axillary node biopsy.  Making use of a 22-gauge needle 1 cc of "spot" was injected into the previously biopsied node.  A Band-Aid was applied.  The patient was transported the PACU in stable condition.  Erect portable chest x-ray showed the catheter tip as described above and no evidence of pneumothorax.

## 2021-11-09 NOTE — Transfer of Care (Signed)
Immediate Anesthesia Transfer of Care Note  Patient: Marilyn Page  Procedure(s) Performed: INSERTION PORT-A-CATH (Left: Chest) PUNCH BIOPSY SKIN, TATTOO LYMPH NODE RIGHT AXILLA (Right: Breast)  Patient Location: PACU  Anesthesia Type:General  Level of Consciousness: drowsy  Airway & Oxygen Therapy: Patient Spontanous Breathing  Post-op Assessment: Report given to RN and Post -op Vital signs reviewed and stable  Post vital signs: Reviewed and stable  Last Vitals:  Vitals Value Taken Time  BP 93/49 11/09/21 0946  Temp    Pulse 85 11/09/21 0948  Resp 18 11/09/21 0948  SpO2 91 % 11/09/21 0948  Vitals shown include unvalidated device data.  Last Pain:  Vitals:   11/09/21 0738  TempSrc: Temporal  PainSc: 0-No pain         Complications: No notable events documented.

## 2021-11-09 NOTE — Anesthesia Postprocedure Evaluation (Signed)
Anesthesia Post Note  Patient: Marilyn Page  Procedure(s) Performed: INSERTION PORT-A-CATH (Left: Chest) PUNCH BIOPSY SKIN, TATTOO LYMPH NODE RIGHT AXILLA (Right: Breast)  Patient location during evaluation: PACU Anesthesia Type: General Level of consciousness: awake and alert Pain management: pain level controlled Vital Signs Assessment: post-procedure vital signs reviewed and stable Respiratory status: spontaneous breathing, nonlabored ventilation, respiratory function stable and patient connected to nasal cannula oxygen Cardiovascular status: blood pressure returned to baseline and stable Postop Assessment: no apparent nausea or vomiting Anesthetic complications: no   No notable events documented.   Last Vitals:  Vitals:   11/09/21 1015 11/09/21 1027  BP:  133/69  Pulse: 72 68  Resp: (!) 9 14  Temp: 36.7 C (!) 36.4 C  SpO2: 98% 100%    Last Pain:  Vitals:   11/09/21 1027  TempSrc: Oral  PainSc: 1                  Precious Haws Candis Kabel

## 2021-11-09 NOTE — H&P (Signed)
Marilyn Page [id number] 1976-02-02     HPI: Marilyn Page woman with newly diagnosed breast cancer. Central venous access requested for neo-adjuvant chemotherapy.  Skin biopsy and node tattooing for confirmation and later nodal identification.   Medications Prior to Admission  Medication Sig Dispense Refill Last Dose   Cholecalciferol 50 MCG (2000 UT) CAPS Take by mouth.   Past Week   norethindrone (MICRONOR) 0.35 MG tablet Take 1 tablet (0.35 mg total) by mouth daily. 84 tablet 2 11/08/2021   Alcohol Swabs (ALCOHOL WIPES) 70 % PADS Apply topically.      ferrous sulfate 325 (65 FE) MG tablet Take 325 mg by mouth 2 (two) times daily with a meal.   11/05/2021   lidocaine-prilocaine (EMLA) cream Apply on the port. 30 -45 min  prior to port access. 30 g 3    ondansetron (ZOFRAN-ODT) 8 MG disintegrating tablet Take 1 tablet (8 mg total) by mouth every 8 (eight) hours as needed for nausea or vomiting. 45 tablet 1    ONETOUCH VERIO test strip SMARTSIG:1 Each Via Meter Daily PRN      OZEMPIC, 0.25 OR 0.5 MG/DOSE, 2 MG/1.5ML SOPN PLEASE SEE ATTACHED FOR DETAILED DIRECTIONS   11/06/2021   prochlorperazine (COMPAZINE) 10 MG tablet Take 1 tablet (10 mg total) by mouth every 6 (six) hours as needed for nausea or vomiting. 40 tablet 1    Allergies  Allergen Reactions   Metformin Other (See Comments)   Past Medical History:  Diagnosis Date   Carcinoma of upper-outer quadrant of right breast in female, estrogen receptor positive ([place]) 10/26/2021   Diabetes mellitus without complication ([place])    History of gestational diabetes    Past Surgical History:  Procedure Laterality Date   BREAST BIOPSY Right 10/19/2021   [place] Core bx 10:00 6 cmfn Ribbon Clip. Axilla Butterfly hydro clip- path pending   CESAREAN SECTION  2005/2008   DILATION AND CURETTAGE OF UTERUS  2003   Social History   Socioeconomic History   Marital status: Married    Spouse name: Jaci Standard   Number of children: 2   Years of education: Not on  file   Highest education level: Not on file  Occupational History   Occupation: and Optometrist  Tobacco Use   Smoking status: Never   Smokeless tobacco: Never  Vaping Use   Vaping Use: Never used  Substance and Sexual Activity   Alcohol use: Never   Drug use: Never   Sexual activity: Yes    Birth control/protection: Other-see comments, Surgical    Comment: vasectomy  Other Topics Concern   Not on file  Social History Narrative   Pre-school teacher; in [place]; 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      ROS: Negative.     PE: HEENT: Negative. Lungs: Clear. Cardio: RR.   Assessment/Plan:  Proceed with planned left power port and right skin biopsy, axillary node tattooing.   Forest Gleason [facility] 11/09/2021

## 2021-11-09 NOTE — Anesthesia Procedure Notes (Signed)
Procedure Name: MAC Date/Time: 11/09/2021 8:55 AM Performed by: Biagio Borg, CRNA Pre-anesthesia Checklist: Patient identified, Emergency Drugs available, Suction available, Patient being monitored and Timeout performed Patient Re-evaluated:Patient Re-evaluated prior to induction Oxygen Delivery Method: Nasal cannula Induction Type: IV induction Placement Confirmation: positive ETCO2 and CO2 detector

## 2021-11-09 NOTE — Discharge Instructions (Signed)
AMBULATORY SURGERY  ?DISCHARGE INSTRUCTIONS ? ? ?The drugs that you were given will stay in your system until tomorrow so for the next 24 hours you should not: ? ?Drive an automobile ?Make any legal decisions ?Drink any alcoholic beverage ? ? ?You may resume regular meals tomorrow.  Today it is better to start with liquids and gradually work up to solid foods. ? ?You may eat anything you prefer, but it is better to start with liquids, then soup and crackers, and gradually work up to solid foods. ? ? ?Please notify your doctor immediately if you have any unusual bleeding, trouble breathing, redness and pain at the surgery site, drainage, fever, or pain not relieved by medication. ? ? ? ?Additional Instructions: ? ? ? ?Please contact your physician with any problems or Same Day Surgery at [phone], Monday through Friday 6 am to 4 pm, or [facility] number at [phone].  ?

## 2021-11-10 ENCOUNTER — Inpatient Hospital Stay: Payer: 59

## 2021-11-10 ENCOUNTER — Inpatient Hospital Stay (HOSPITAL_BASED_OUTPATIENT_CLINIC_OR_DEPARTMENT_OTHER): Payer: 59 | Admitting: Internal Medicine

## 2021-11-10 ENCOUNTER — Encounter: Payer: Self-pay | Admitting: Internal Medicine

## 2021-11-10 ENCOUNTER — Encounter
Admission: RE | Admit: 2021-11-10 | Discharge: 2021-11-10 | Disposition: A | Discharge status: Home / Self Care | Payer: 59 | Source: Ambulatory Visit | Attending: Internal Medicine | Admitting: Internal Medicine

## 2021-11-10 ENCOUNTER — Encounter: Payer: Self-pay | Admitting: General Surgery

## 2021-11-10 DIAGNOSIS — C773 Secondary and unspecified malignant neoplasm of axilla and upper limb lymph nodes: Secondary | ICD-10-CM | POA: Insufficient documentation

## 2021-11-10 DIAGNOSIS — E119 Type 2 diabetes mellitus without complications: Secondary | ICD-10-CM | POA: Insufficient documentation

## 2021-11-10 DIAGNOSIS — Z170 Estrogen receptor positive status [ER+]: Secondary | ICD-10-CM | POA: Insufficient documentation

## 2021-11-10 DIAGNOSIS — C50411 Malignant neoplasm of upper-outer quadrant of right female breast: Secondary | ICD-10-CM

## 2021-11-10 DIAGNOSIS — Z5189 Encounter for other specified aftercare: Secondary | ICD-10-CM | POA: Insufficient documentation

## 2021-11-10 DIAGNOSIS — Z5111 Encounter for antineoplastic chemotherapy: Secondary | ICD-10-CM | POA: Insufficient documentation

## 2021-11-10 DIAGNOSIS — R7989 Other specified abnormal findings of blood chemistry: Secondary | ICD-10-CM | POA: Insufficient documentation

## 2021-11-10 DIAGNOSIS — Z5112 Encounter for antineoplastic immunotherapy: Secondary | ICD-10-CM | POA: Insufficient documentation

## 2021-11-10 DIAGNOSIS — E860 Dehydration: Secondary | ICD-10-CM | POA: Diagnosis not present

## 2021-11-10 DIAGNOSIS — R234 Changes in skin texture: Secondary | ICD-10-CM | POA: Insufficient documentation

## 2021-11-10 LAB — CBC WITH DIFFERENTIAL/PLATELET
Abs Immature Granulocytes: 0.03 K/uL (ref 0.00–0.07)
Basophils Absolute: 0.1 K/uL (ref 0.0–0.1)
Basophils Relative: 1 %
Eosinophils Absolute: 0.2 K/uL (ref 0.0–0.5)
Eosinophils Relative: 3 %
HCT: 39.1 % (ref 36.0–46.0)
Hemoglobin: 13.1 g/dL (ref 12.0–15.0)
Immature Granulocytes: 0 %
Lymphocytes Relative: 20 %
Lymphs Abs: 1.9 K/uL (ref 0.7–4.0)
MCH: 28.5 pg (ref 26.0–34.0)
MCHC: 33.5 g/dL (ref 30.0–36.0)
MCV: 85.2 fL (ref 80.0–100.0)
Monocytes Absolute: 0.7 K/uL (ref 0.1–1.0)
Monocytes Relative: 8 %
Neutro Abs: 6.4 K/uL (ref 1.7–7.7)
Neutrophils Relative %: 68 %
Platelets: 262 K/uL (ref 150–400)
RBC: 4.59 MIL/uL (ref 3.87–5.11)
RDW: 12.8 % (ref 11.5–15.5)
WBC: 9.3 K/uL (ref 4.0–10.5)
nRBC: 0 % (ref 0.0–0.2)

## 2021-11-10 LAB — COMPREHENSIVE METABOLIC PANEL
ALT: 17 U/L (ref 0–44)
AST: 17 U/L (ref 15–41)
Albumin: 3.5 g/dL (ref 3.5–5.0)
Alkaline Phosphatase: 54 U/L (ref 38–126)
Anion gap: 7 (ref 5–15)
BUN: 11 mg/dL (ref 6–20)
CO2: 28 mmol/L (ref 22–32)
Calcium: 8.6 mg/dL — ABNORMAL LOW (ref 8.9–10.3)
Chloride: 106 mmol/L (ref 98–111)
Creatinine, Ser: 0.78 mg/dL (ref 0.44–1.00)
GFR, Estimated: >60 mL/min (ref >60)
Glucose, Bld: 107 mg/dL — ABNORMAL HIGH (ref 70–99)
Potassium: 3.4 mmol/L — ABNORMAL LOW (ref 3.5–5.1)
Sodium: 141 mmol/L (ref 135–145)
Total Bilirubin: 0.4 mg/dL (ref 0.3–1.2)
Total Protein: 7.1 g/dL (ref 6.5–8.1)

## 2021-11-10 LAB — GLUCOSE, CAPILLARY: Glucose-Capillary: 89 mg/dL (ref 70–99)

## 2021-11-10 MED ORDER — DEXAMETHASONE 4 MG PO TABS: ORAL_TABLET | ORAL | 0 refills | Status: DC

## 2021-11-10 MED ORDER — FLUDEOXYGLUCOSE F - 18 (FDG) INJECTION
11.4000 | Freq: Once | INTRAVENOUS | Status: AC | PRN
  Administered 2021-11-10: 12.33 via INTRAVENOUS

## 2021-11-10 MED FILL — Fosaprepitant Dimeglumine For IV Infusion 150 MG (Base Eq): INTRAVENOUS | Qty: 5 | Status: AC

## 2021-11-10 MED FILL — Dexamethasone Sodium Phosphate Inj 100 MG/10ML: INTRAMUSCULAR | Qty: 1 | Status: AC

## 2021-11-10 NOTE — Progress Notes (Signed)
[facility] [place] NOTE  Patient Care Team: Latanya Maudlin, NP as PCP - General (Family Medicine) Daiva Huge, RN as Oncology Nurse Navigator Cammie Sickle, [name]  CHIEF COMPLAINTS/PURPOSE OF CONSULTATION: Breast cancer  #  Oncology History Overview Note  MAY 2023-    Targeted ultrasound is performed, showing a 2.9 x 1.9 x 2.0 cm irregular hypoechoic mass right breast 10 o'clock position 6 cm from nipple at the site of palpable concern.   There is an abnormal 8 mm thickened right axillary lymph node.   There is skin thickening overlying the right breast. ------------------  A. BREAST, RIGHT AT 10:00, 6 CM FROM THE NIPPLE; ULTRASOUND-GUIDED CORE  NEEDLE BIOPSY:  - INVASIVE MAMMARY CARCINOMA, NO SPECIAL TYPE.   Size of invasive carcinoma: 11 mm in this sample  Histologic grade of invasive carcinoma: Grade 2                       Glandular/tubular differentiation score: 3                       Nuclear pleomorphism score: 2                       Mitotic rate score: 1                       Total score: 6  Ductal carcinoma in situ: Present, intermediate grade  Lymphovascular invasion: Not identified   B. LYMPH NODE, RIGHT AXILLARY; ULTRASOUND-GUIDED CORE NEEDLE BIOPSY:  - 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 IN GREATEST  EXTENT.   Comment:  The malignancy in the primary breast lesion appears morphologically  different from tumor in the lymph node sampling, with tumor present in  lymph node more suggestive of a histologic grade 3, with significantly  increased mitotic activity and pleomorphism. Due to the discrepancy in  these morphologic patterns, ER/PR/HER2 immunohistochemistry will be  performed on both blocks A1 and B1, with reflex to [place] for HER2 2+. The  results will be reported in an addendum.   ADDENDUM:  CASE SUMMARY: BREAST BIOMARKER TESTS - A - BREAST, RIGHT AT 10:00  Estrogen Receptor  (ER) Status: POSITIVE          Percentage of cells with nuclear positivity: 71-80%          Average intensity of staining: Strong   Progesterone Receptor (PgR) Status: POSITIVE          Percentage of cells with nuclear positivity: 81-90%          Average intensity of staining: Strong   HER2 (by immunohistochemistry): POSITIVE (Score 3+)       Percentage of cells with uniform intense complete membrane  staining: 61-70%  HER2 displays a heterogeneous staining pattern, with areas showing a  strong 3+ staining pattern, and other regions with complete absence (0+)  of staining.   Ki-67: Not performed   CASE SUMMARY: BREAST BIOMARKER TESTS - B - RIGHT AXILLARY LYMPH NODE  Estrogen Receptor (ER) Status: POSITIVE          Percentage of cells with nuclear positivity: 81-90%          Average intensity of staining: Strong   Progesterone Receptor (PgR) Status: POSITIVE          Percentage of cells with nuclear positivity: 51-60%  Average intensity of staining: Strong   HER2 (by immunohistochemistry): NEGATIVE (Score 1+)  Ki-67: Not performed   # June 7th, 2023- TCH+P; skin biopsy pending; June 6th PET-pending    Carcinoma of upper-outer quadrant of right breast in female, estrogen receptor positive (HCC)  10/26/2021 Initial Diagnosis   Carcinoma of upper-outer quadrant of right breast in female, estrogen receptor positive (HCC)   10/26/2021 Cancer Staging   Staging form: Breast, AJCC 8th Edition - Clinical: Stage IB (cT2, cN1, cM0, G2, ER+, PR+, HER2+) - Signed by [name], MD on 10/26/2021 Histologic grading system: 3 grade system    11/11/2021 -  Chemotherapy   Patient is on Treatment Plan : BREAST  Docetaxel + Carboplatin + Trastuzumab + Pertuzumab  (TCHP) q21d         HISTORY OF PRESENTING ILLNESS: Patient is ambulating independently.  Accompanied by her husband.   Marilyn Page 46 y.o.  female right breast TRIPLE POSITIVE Stage Ib is here to proceed with  chemotherapy/PET scan results.  In the interim patient underwent Mediport placement.  She also underwent skin biopsy of the right breast given the skin thickening noted on imaging.  Patient also underwent MUGA scan.    Review of Systems  Constitutional:  Negative for chills, diaphoresis, fever, malaise/fatigue and weight loss.  HENT:  Negative for nosebleeds and sore throat.   Eyes:  Negative for double vision.  Respiratory:  Negative for cough, hemoptysis, sputum production, shortness of breath and wheezing.   Cardiovascular:  Negative for chest pain, palpitations, orthopnea and leg swelling.  Gastrointestinal:  Negative for abdominal pain, blood in stool, constipation, diarrhea, heartburn, melena, nausea and vomiting.  Genitourinary:  Negative for dysuria, frequency and urgency.  Musculoskeletal:  Negative for back pain and joint pain.  Skin: Negative.  Negative for itching and rash.  Neurological:  Negative for dizziness, tingling, focal weakness, weakness and headaches.  Endo/Heme/Allergies:  Does not bruise/bleed easily.  Psychiatric/Behavioral:  Negative for depression. The patient is not nervous/anxious and does not have insomnia.     MEDICAL HISTORY:  Past Medical History:  Diagnosis Date   Carcinoma of upper-outer quadrant of right breast in female, estrogen receptor positive (HCC) 10/26/2021   Diabetes mellitus without complication (HCC)    History of gestational diabetes     SURGICAL HISTORY: Past Surgical History:  Procedure Laterality Date   BREAST BIOPSY Right 10/19/2021   [place] Core bx 10:00 6 cmfn Ribbon Clip. Axilla Butterfly hydro clip- path pending   CESAREAN SECTION  2005/2008   DILATION AND CURETTAGE OF UTERUS  2003   PORTACATH PLACEMENT Left 11/09/2021   Procedure: INSERTION PORT-A-CATH;  Surgeon: Earline Mayotte, MD;  Location: [facility] ORS;  Service: General;  Laterality: Left;   SKIN BIOPSY Right 11/09/2021   Procedure: PUNCH BIOPSY SKIN, TATTOO LYMPH NODE RIGHT  AXILLA;  Surgeon: Earline Mayotte, MD;  Location: [facility] ORS;  Service: General;  Laterality: Right;    SOCIAL HISTORY: Social History   Socioeconomic History   Marital status: Married    Spouse name: Fraser Din   Number of children: 2   Years of education: Not on file   Highest education level: Not on file  Occupational History   Occupation: and Architectural technologist  Tobacco Use   Smoking status: Never   Smokeless tobacco: Never  Vaping Use   Vaping Use: Never used  Substance and Sexual Activity   Alcohol use: Never   Drug use: Never   Sexual activity: Yes  Birth control/protection: Other-see comments, Surgical    Comment: vasectomy  Other Topics Concern   Not on file  Social History Narrative   Pre-school teacher; in [place]; 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    Social Determinants of Health   Financial Resource Strain: Not on file  Food Insecurity: Not on file  Transportation Needs: Not on file  Physical Activity: Not on file  Stress: Not on file  Social Connections: Not on file  Intimate Partner Violence: Not on file    FAMILY HISTORY: Family History  Problem Relation Age of Onset   Ovarian cysts Mother    Migraines Mother    Hyperlipidemia Father    Diabetes Maternal Aunt    Diabetes Maternal Grandfather    Ovarian cancer Cousin 20       maternal 1st cousin-now in her 56s   Breast cancer Neg Hx     ALLERGIES:  is allergic to metformin.  MEDICATIONS:  Current Outpatient Medications  Medication Sig Dispense Refill   Alcohol Swabs (ALCOHOL WIPES) 70 % PADS Apply topically.     dexamethasone (DECADRON) 4 MG tablet One pill twice a day - Start the day prior to chemo; do not take the day of chemo. 40 tablet 0   ferrous sulfate 325 (65 FE) MG tablet Take 325 mg by mouth 2 (two) times daily with a meal.     lidocaine-prilocaine (EMLA) cream Apply on the port. 30 -45 min  prior to port access. 30 g 3   norethindrone (MICRONOR) 0.35 MG tablet Take 1 tablet (0.35 mg total)  by mouth daily. 84 tablet 2   ondansetron (ZOFRAN-ODT) 8 MG disintegrating tablet Take 1 tablet (8 mg total) by mouth every 8 (eight) hours as needed for nausea or vomiting. 45 tablet 1   ONETOUCH VERIO test strip SMARTSIG:1 Each Via Meter Daily PRN     OZEMPIC, 0.25 OR 0.5 MG/DOSE, 2 MG/1.5ML SOPN PLEASE SEE ATTACHED FOR DETAILED DIRECTIONS     prochlorperazine (COMPAZINE) 10 MG tablet Take 1 tablet (10 mg total) by mouth every 6 (six) hours as needed for nausea or vomiting. 40 tablet 1   Cholecalciferol 50 MCG (2000 UT) CAPS Take by mouth. (Patient not taking: Reported on 11/10/2021)     No current facility-administered medications for this visit.      Marland Kitchen  PHYSICAL EXAMINATION: ECOG PERFORMANCE STATUS: 0 - Asymptomatic  There were no vitals filed for this visit.  There were no vitals filed for this visit.  Right breast-10:00 vague 3 to 4 cm mass noted-with skin thickening/ ?  Biopsy changes.  No nipple changes no erythema.   Physical Exam Vitals and nursing note reviewed.  HENT:     Head: Normocephalic and atraumatic.     Mouth/Throat:     Pharynx: Oropharynx is clear.  Eyes:     Extraocular Movements: Extraocular movements intact.     Pupils: Pupils are equal, round, and reactive to light.  Cardiovascular:     Rate and Rhythm: Normal rate and regular rhythm.  Pulmonary:     Comments: Decreased breath sounds bilaterally.  Abdominal:     Palpations: Abdomen is soft.  Musculoskeletal:        General: Normal range of motion.     Cervical back: Normal range of motion.  Skin:    General: Skin is warm.  Neurological:     General: No focal deficit present.     Mental Status: She is alert and oriented to person, place, and time.  Psychiatric:  Behavior: Behavior normal.        Judgment: Judgment normal.     LABORATORY DATA:  I have reviewed the data as listed Lab Results  Component Value Date   WBC 9.3 11/10/2021   HGB 13.1 11/10/2021   HCT 39.1 11/10/2021   MCV  85.2 11/10/2021   PLT 262 11/10/2021   Recent Labs    05/11/21 1225 10/26/21 1606 11/10/21 1510  NA 139 139 141  K 3.5 3.5 3.4*  CL 104 105 106  CO2 $Re'26 30 28  'cRj$ GLUCOSE 115* 119* 107*  BUN $Re'11 10 11  'XQB$ CREATININE 0.71 0.77 0.78  CALCIUM 8.6* 8.7* 8.6*  GFRNONAA >60 >60 >60  PROT 7.2 7.7 7.1  ALBUMIN 3.7 3.8 3.5  AST $Re'20 19 17  'zAJ$ ALT $R'24 22 17  'Du$ ALKPHOS 51 56 54  BILITOT 0.2* 0.3 0.4    RADIOGRAPHIC STUDIES: I have personally reviewed the radiological images as listed and agreed with the findings in the report. NM Cardiac Muga Rest  Result Date: 11/04/2021 CLINICAL DATA:  Breast cancer.  Pre chemotherapy. EXAM: NUCLEAR MEDICINE CARDIAC BLOOD POOL IMAGING (MUGA) TECHNIQUE: Cardiac multi-gated acquisition was performed at rest following intravenous injection of Tc-59m labeled red blood cells. RADIOPHARMACEUTICALS:  21.24 mCi Tc-42m pertechnetate in-vitro labeled red blood cells IV COMPARISON:  None Available. FINDINGS: The seen a images demonstrate normal left ventricular wall motion. No akinetic or dyskinetic segments are identified. The calculated ejection fraction is 62.1. IMPRESSION: Ejection fraction is 62.1 Electronically Signed   By: Marijo Sanes M.D.   On: 11/04/2021 16:02   DG Chest Port 1 View  Result Date: 11/09/2021 CLINICAL DATA:  Port-A-Cath insertion. EXAM: PORTABLE CHEST 1 VIEW COMPARISON:  None Available. FINDINGS: Heart size is normal. A left subclavian Port-A-Cath is in place. The tip of the catheter is in the right atrium, proximally 4 cm beyond the cavoatrial junction. Lung volumes are low. Mild atelectasis is present at the lung bases. No pneumothorax is present. IMPRESSION: 1. Tip of left subclavian Port-A-Cath is in the right atrium, approximately 4 cm beyond the cavoatrial junction. 2. Low lung volumes and mild bibasilar atelectasis. These results will be called to the ordering clinician or representative by the Radiologist Assistant, and communication documented in the  PACS or [employer]. Electronically Signed   By: San Morelle M.D.   On: 11/09/2021 10:06   DG C-Arm 1-60 Min-No Report  Result Date: 11/09/2021 Fluoroscopy was utilized by the requesting physician.  No radiographic interpretation.   [place] AXILLARY NODE CORE BIOPSY RIGHT  Addendum Date: 10/21/2021   ADDENDUM REPORT: 10/21/2021 09:35 ADDENDUM: Pathology revealed GRADE II INVASIVE MAMMARY CARCINOMA, NO SPECIAL TYPE, INTERMEDIATE GRADE DUCTAL CARCINOMA IN SITU of the RIGHT breast, 10 o'clock, 6 cmfn (ribbon clip). This was found to be concordant by Dr. Ammie Ferrier. Pathology revealed 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 IN GREATEST EXTENT of the RIGHT axillary lymph node (HydroMARK butterfly-shaped clip). This was found to be concordant by Dr. Ammie Ferrier. Pathology results were discussed with the patient by telephone. The patient reported doing well after the biopsies with tenderness at the sites. Post biopsy instructions and care were reviewed and questions were answered. The patient was encouraged to call [facility] for any additional concerns. Recommendations: Breast MRI for extent of disease per diagnostic report given dense breast tissue and premenopausal age. Also, note that there are at least 3 abnormal lymph nodes that were found in the  right axilla at the time of biopsy. MRI may help reassess the axilla. Biopsy results called to Patent examiner at [facility] on 10/21/2021 and sent secure email to [facility] Nurse Oncology Navigator 10/20/2021. Pathology results reported by Stacie Acres RN on 10/21/2021. Electronically Signed   By: Ammie Ferrier M.D.   On: 10/21/2021 09:35   Result Date: 10/21/2021 CLINICAL DATA:  46 year old female presenting for ultrasound-guided biopsy of a right breast mass and right axillary lymph node. EXAM: ULTRASOUND GUIDED RIGHT BREAST CORE NEEDLE BIOPSY COMPARISON:   None Available. PROCEDURE: I met with the patient and we discussed the procedure of ultrasound-guided biopsy, including benefits and alternatives. We discussed the high likelihood of a successful procedure. We discussed the risks of the procedure, including infection, bleeding, tissue injury, clip migration, and inadequate sampling. Informed written consent was given. The usual time-out protocol was performed immediately prior to the procedure. Lesion quadrant: Upper outer quadrant Using sterile technique and 1% Lidocaine as local anesthetic, under direct ultrasound visualization, a 14 gauge spring-loaded device was used to perform biopsy of a mass in the right breast at 10 o'clock, 6 cm from the nipple using an inferior approach. At the conclusion of the procedure a ribbon shaped tissue marker clip was deployed into the biopsy cavity. Lesion quadrant: Right axilla Upon scanning in preparation for the biopsy, I noted at least 3 level 1 lymph nodes clustered together that appear abnormal. I biopsied one of the lymph nodes in this cluster. Using sterile technique and 1% Lidocaine as local anesthetic, under direct ultrasound visualization, a 14 gauge spring-loaded device was used to perform biopsy of a right axillary lymph node using an inferior approach. At the conclusion of the procedure HydroMARK butterfly shaped tissue marker clip was deployed into the biopsy cavity. Follow up 2 view mammogram was performed and dictated separately. IMPRESSION: 1. Ultrasound guided biopsy of a mass in the right breast at 10 o'clock. No apparent complications. 2. Ultrasound-guided biopsy of a right axillary lymph node. No apparent complications. Note that there are at least 3 clustered level 1 abnormal lymph nodes. Electronically Signed: By: Ammie Ferrier M.D. On: 10/19/2021 09:16   MM CLIP PLACEMENT RIGHT  Result Date: 10/19/2021 CLINICAL DATA:  Post biopsy mammogram of the right breast for clip placement. EXAM: 3D  DIAGNOSTIC RIGHT MAMMOGRAM POST ULTRASOUND BIOPSY COMPARISON:  Previous exam(s). FINDINGS: 3D Mammographic images were obtained following ultrasound guided biopsy of a right breast mass and right axillary lymph node. The biopsy marking clip is in expected position at the site of biopsy. The ribbon shaped biopsy marking clip is placed along the anterior margin of the biopsied mass. IMPRESSION: 1. Appropriate positioning of the ribbon shaped biopsy marking clip at the site of biopsy in the upper-outer right breast. 2. Appropriate positioning of the [facility] shaped biopsy marking clip in the right axilla. Final Assessment: Post Procedure Mammograms for Marker Placement Electronically Signed   By: Ammie Ferrier M.D.   On: 10/19/2021 09:23  [place] RT BREAST BX W LOC DEV 1ST LESION IMG BX SPEC US GUIDE  Addendum Date: 10/21/2021   ADDENDUM REPORT: 10/21/2021 09:35 ADDENDUM: Pathology revealed GRADE II INVASIVE MAMMARY CARCINOMA, NO SPECIAL TYPE, INTERMEDIATE GRADE DUCTAL CARCINOMA IN SITU of the RIGHT breast, 10 o'clock, 6 cmfn (ribbon clip). This was found to be concordant by Dr. Ammie Ferrier. Pathology revealed 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 IN GREATEST EXTENT of the RIGHT axillary lymph node (HydroMARK butterfly-shaped clip). This was found  to be concordant by Dr. Ammie Ferrier. Pathology results were discussed with the patient by telephone. The patient reported doing well after the biopsies with tenderness at the sites. Post biopsy instructions and care were reviewed and questions were answered. The patient was encouraged to call [facility] for any additional concerns. Recommendations: Breast MRI for extent of disease per diagnostic report given dense breast tissue and premenopausal age. Also, note that there are at least 3 abnormal lymph nodes that were found in the right axilla at the time of biopsy. MRI may help reassess the  axilla. Biopsy results called to Patent examiner at [facility] on 10/21/2021 and sent secure email to [facility] Nurse Oncology Navigator 10/20/2021. Pathology results reported by Stacie Acres RN on 10/21/2021. Electronically Signed   By: Ammie Ferrier M.D.   On: 10/21/2021 09:35   Result Date: 10/21/2021 CLINICAL DATA:  46 year old female presenting for ultrasound-guided biopsy of a right breast mass and right axillary lymph node. EXAM: ULTRASOUND GUIDED RIGHT BREAST CORE NEEDLE BIOPSY COMPARISON:  None Available. PROCEDURE: I met with the patient and we discussed the procedure of ultrasound-guided biopsy, including benefits and alternatives. We discussed the high likelihood of a successful procedure. We discussed the risks of the procedure, including infection, bleeding, tissue injury, clip migration, and inadequate sampling. Informed written consent was given. The usual time-out protocol was performed immediately prior to the procedure. Lesion quadrant: Upper outer quadrant Using sterile technique and 1% Lidocaine as local anesthetic, under direct ultrasound visualization, a 14 gauge spring-loaded device was used to perform biopsy of a mass in the right breast at 10 o'clock, 6 cm from the nipple using an inferior approach. At the conclusion of the procedure a ribbon shaped tissue marker clip was deployed into the biopsy cavity. Lesion quadrant: Right axilla Upon scanning in preparation for the biopsy, I noted at least 3 level 1 lymph nodes clustered together that appear abnormal. I biopsied one of the lymph nodes in this cluster. Using sterile technique and 1% Lidocaine as local anesthetic, under direct ultrasound visualization, a 14 gauge spring-loaded device was used to perform biopsy of a right axillary lymph node using an inferior approach. At the conclusion of the procedure HydroMARK butterfly shaped tissue marker clip was deployed into the biopsy cavity. Follow up 2 view mammogram was  performed and dictated separately. IMPRESSION: 1. Ultrasound guided biopsy of a mass in the right breast at 10 o'clock. No apparent complications. 2. Ultrasound-guided biopsy of a right axillary lymph node. No apparent complications. Note that there are at least 3 clustered level 1 abnormal lymph nodes. Electronically Signed: By: Ammie Ferrier M.D. On: 10/19/2021 09:16    ASSESSMENT & PLAN:   Carcinoma of upper-outer quadrant of right breast in female, estrogen receptor positive ([place]) #T2N1 ER/PR positive HER2 POSITIVE right breast cancer; lymph node positive-ER/PR positive however 2 NEGATIVE s/p biopsy [see discussion below].  Discussed with Dr. Sedalia Muta tumor heterogeneity.  Awaiting breast MRI next week.   #PET scan pending done this morning.  Also had skin biopsy given the skin thickening yesterday [Dr.Byrnett].  Results pending.  #Proceed with neoadjuvant chemotherapy- TCH plus P x6 cycles on neoadjuvant basis. MUGA scan May 31st, 2023- -61%.  Proceed with cycle #1 on 06/07; d-3 udenyca.  Discussed regarding use of Claritin post growth factor.  # Borderline diabetes- on Ozempic once a week/fridays; fasting blood glucose-80s to 90s.  However with steroids-recommend close monitoring; call if blood glucose  greater than 250.  Recommended glipizide/short-acting.   # Hypocalcemia- 8.7 - recommend  Ca+vit D.  Vitamin D levels at next visit.  # Genetic counseling: Given family history of ?  Ovarian cancer young age of presentation-. Awaiting 6/12.   # DISPOSITION: # proceed with Chemo as planned tomorrow; D-2 Ellen Henri as planned. # follow up with NP in 10 days- labs- cbc/bmp;  # follow up in 3 weeks-  cbc/cmp ;Taxotere+ Caboplatin+herceptin+Perjeta; D-2 Ellen Henri- Dr.B    All questions were answered. The patient/family knows to call the clinic with any problems, questions or concerns.       Cammie Sickle, MD 11/10/2021 4:06 PM

## 2021-11-10 NOTE — Progress Notes (Signed)
Patient denies new problems/concerns today.   °

## 2021-11-10 NOTE — Assessment & Plan Note (Addendum)
#  T2N1 ER/PR positive HER2 POSITIVE right breast cancer; lymph node positive-ER/PR positive however 2 NEGATIVE s/p biopsy [see discussion below].  Discussed with Dr. Sedalia Muta tumor heterogeneity.  Awaiting breast MRI next week.   #PET scan pending done this morning.  Also had skin biopsy given the skin thickening yesterday [Dr.Byrnett].  Results pending.  #Proceed with neoadjuvant chemotherapy- TCH plus P x6 cycles on neoadjuvant basis. MUGA scan May 31st, 2023- -61%.  Proceed with cycle #1 on 06/07; d-3 udenyca.  Discussed regarding use of Claritin post growth factor.  # Borderline diabetes- on Ozempic once a week/fridays; fasting blood glucose-80s to 90s.  However with steroids-recommend close monitoring; call if blood glucose greater than 250.  Recommended glipizide/short-acting.   # Hypocalcemia- 8.7 - recommend  Ca+vit D.  Vitamin D levels at next visit.  # Genetic counseling: Given family history of ?  Ovarian cancer young age of presentation-. Awaiting 6/12.   # DISPOSITION: # proceed with Chemo as planned tomorrow; D-2 Ellen Henri as planned. # follow up with NP in 10 days- labs- cbc/bmp;  # follow up in 3 weeks-  cbc/cmp ;Taxotere+ Caboplatin+herceptin+Perjeta; D-2 Ellen Henri- Dr.B

## 2021-11-10 NOTE — Telephone Encounter (Signed)
FMLA for has been submitted

## 2021-11-11 ENCOUNTER — Other Ambulatory Visit: Payer: Self-pay | Admitting: General Surgery

## 2021-11-11 ENCOUNTER — Inpatient Hospital Stay: Payer: 59

## 2021-11-11 ENCOUNTER — Encounter: Payer: Self-pay | Admitting: *Deleted

## 2021-11-11 ENCOUNTER — Encounter: Payer: Self-pay | Admitting: Internal Medicine

## 2021-11-11 VITALS — BP 116/64 | HR 82 | Temp 97.1°F | Resp 17 | Ht 65.0 in | Wt 224.9 lb

## 2021-11-11 DIAGNOSIS — Z5112 Encounter for antineoplastic immunotherapy: Secondary | ICD-10-CM | POA: Diagnosis not present

## 2021-11-11 DIAGNOSIS — Z170 Estrogen receptor positive status [ER+]: Secondary | ICD-10-CM

## 2021-11-11 LAB — SURGICAL PATHOLOGY

## 2021-11-11 MED ORDER — SODIUM CHLORIDE 0.9 % IV SOLN
10.0000 mg | Freq: Once | INTRAVENOUS | Status: AC
  Administered 2021-11-11: 10 mg via INTRAVENOUS
  Filled 2021-11-11: qty 1

## 2021-11-11 MED ORDER — PALONOSETRON HCL INJECTION 0.25 MG/5ML
0.2500 mg | Freq: Once | INTRAVENOUS | Status: AC
  Administered 2021-11-11: 0.25 mg via INTRAVENOUS
  Filled 2021-11-11: qty 5

## 2021-11-11 MED ORDER — SODIUM CHLORIDE 0.9 % IV SOLN
900.0000 mg | Freq: Once | INTRAVENOUS | Status: AC
  Administered 2021-11-11: 900 mg via INTRAVENOUS
  Filled 2021-11-11: qty 90

## 2021-11-11 MED ORDER — HEPARIN SOD (PORK) LOCK FLUSH 100 UNIT/ML IV SOLN
500.0000 [IU] | Freq: Once | INTRAVENOUS | Status: AC | PRN
  Administered 2021-11-11: 500 [IU]
  Filled 2021-11-11: qty 5

## 2021-11-11 MED ORDER — TRASTUZUMAB-DKST CHEMO 150 MG IV SOLR: 750.0000 mg | Freq: Once | INTRAVENOUS | Status: DC

## 2021-11-11 MED ORDER — SODIUM CHLORIDE 0.9 % IV SOLN
840.0000 mg | Freq: Once | INTRAVENOUS | Status: AC
  Administered 2021-11-11: 840 mg via INTRAVENOUS
  Filled 2021-11-11: qty 28

## 2021-11-11 MED ORDER — ACETAMINOPHEN 325 MG PO TABS
325.0000 mg | ORAL_TABLET | Freq: Once | ORAL | Status: AC
  Administered 2021-11-11: 325 mg via ORAL

## 2021-11-11 MED ORDER — SODIUM CHLORIDE 0.9 % IV SOLN
75.0000 mg/m2 | Freq: Once | INTRAVENOUS | Status: AC
  Administered 2021-11-11: 160 mg via INTRAVENOUS
  Filled 2021-11-11: qty 16

## 2021-11-11 MED ORDER — TRASTUZUMAB-DKST CHEMO 150 MG IV SOLR
840.0000 mg | Freq: Once | INTRAVENOUS | Status: AC
  Administered 2021-11-11: 840 mg via INTRAVENOUS
  Filled 2021-11-11: qty 40

## 2021-11-11 MED ORDER — SODIUM CHLORIDE 0.9 % IV SOLN
150.0000 mg | Freq: Once | INTRAVENOUS | Status: AC
  Administered 2021-11-11: 150 mg via INTRAVENOUS
  Filled 2021-11-11: qty 5

## 2021-11-11 MED ORDER — SODIUM CHLORIDE 0.9 % IV SOLN
Freq: Once | INTRAVENOUS | Status: AC
  Filled 2021-11-11: qty 250

## 2021-11-11 MED ORDER — ACETAMINOPHEN 325 MG PO TABS
650.0000 mg | ORAL_TABLET | Freq: Once | ORAL | Status: DC
  Filled 2021-11-11: qty 2

## 2021-11-11 MED ORDER — DIPHENHYDRAMINE HCL 25 MG PO CAPS
50.0000 mg | ORAL_CAPSULE | Freq: Once | ORAL | Status: AC
  Administered 2021-11-11: 50 mg via ORAL
  Filled 2021-11-11: qty 2

## 2021-11-11 NOTE — Progress Notes (Signed)
I spoke with patient regarding the results of the pet scan. Await MRI  breast week. Earlier reviewed with the patient. The results of the breast skin biopsy. Discuss with dr. Bary Castilla.

## 2021-11-11 NOTE — Patient Instructions (Signed)
[facility] AT [place]  Discharge Instructions: Thank you for choosing [facility] to provide your oncology and hematology care.  If you have a lab appointment with the [facility], please go directly to the [facility] and check in at the registration area.  Wear comfortable clothing and clothing appropriate for easy access to any Portacath or PICC line.   We strive to give you quality time with your provider. You may need to reschedule your appointment if you arrive late (15 or more minutes).  Arriving late affects you and other patients whose appointments are after yours.  Also, if you miss three or more appointments without notifying the office, you may be dismissed from the clinic at the provider's discretion.      For prescription refill requests, have your pharmacy contact our office and allow 72 hours for refills to be completed.    Today you received the following chemotherapy and/or immunotherapy agents Taxotere, Carboplatin, Ogviri, Perjeta      To help prevent nausea and vomiting after your treatment, we encourage you to take your nausea medication as directed.  BELOW ARE SYMPTOMS THAT SHOULD BE REPORTED IMMEDIATELY: *FEVER GREATER THAN 100.4 F (38 C) OR HIGHER *CHILLS OR SWEATING *NAUSEA AND VOMITING THAT IS NOT CONTROLLED WITH YOUR NAUSEA MEDICATION *UNUSUAL SHORTNESS OF BREATH *UNUSUAL BRUISING OR BLEEDING *URINARY PROBLEMS (pain or burning when urinating, or frequent urination) *BOWEL PROBLEMS (unusual diarrhea, constipation, pain near the anus) TENDERNESS IN MOUTH AND THROAT WITH OR WITHOUT PRESENCE OF ULCERS (sore throat, sores in mouth, or a toothache) UNUSUAL RASH, SWELLING OR PAIN  UNUSUAL VAGINAL DISCHARGE OR ITCHING   Items with * indicate a potential emergency and should be followed up as soon as possible or go to the Emergency Department if any problems should occur.  Please show the CHEMOTHERAPY ALERT CARD or  IMMUNOTHERAPY ALERT CARD at check-in to the Emergency Department and triage nurse.  Should you have questions after your visit or need to cancel or reschedule your appointment, please contact [facility] [place] AT [place]  [phone] and follow the prompts.  Office hours are 8:00 a.m. to 4:30 p.m. Monday - Friday. Please note that voicemails left after 4:00 p.m. may not be returned until the following business day.  We are closed weekends and major holidays. You have access to a nurse at all times for urgent questions. Please call the main number to the clinic [phone] and follow the prompts.  For any non-urgent questions, you may also contact your provider using MyChart. We now offer e-Visits for anyone 75 and older to request care online for non-urgent symptoms. For details visit [email or web address].   Also download the MyChart app! Go to the app store, search "MyChart", open the app, select [facility], and log in with your MyChart username and password.  Due to Covid, a mask is required upon entering the hospital/clinic. If you do not have a mask, one will be given to you upon arrival. For doctor visits, patients may have 1 support person aged 68 or older with them. For treatment visits, patients cannot have anyone with them due to current Covid guidelines and our immunocompromised population.   Docetaxel injection What is this medication? DOCETAXEL (doe se TAX el) is a chemotherapy drug. It targets fast dividing cells, like cancer cells, and causes these cells to die. This medicine is used to treat many types of cancers like breast cancer, certain stomach cancers, head and neck cancer, lung cancer, and  prostate cancer. This medicine may be used for other purposes; ask your health care provider or pharmacist if you have questions. COMMON BRAND NAME(S): Docefrez, Taxotere What should I tell my care team before I take this medication? They need to know if you have any of  these conditions: infection (especially a virus infection such as chickenpox, cold sores, or herpes) liver disease low blood counts, like low white cell, platelet, or red cell counts an unusual or allergic reaction to docetaxel, polysorbate 80, other chemotherapy agents, other medicines, foods, dyes, or preservatives pregnant or trying to get pregnant breast-feeding How should I use this medication? This drug is given as an infusion into a vein. It is administered in a hospital or clinic by a specially trained health care professional. Talk to your pediatrician regarding the use of this medicine in children. Special care may be needed. Overdosage: If you think you have taken too much of this medicine contact a poison control center or emergency room at once. NOTE: This medicine is only for you. Do not share this medicine with others. What if I miss a dose? It is important not to miss your dose. Call your doctor or health care professional if you are unable to keep an appointment. What may interact with this medication? Do not take this medicine with any of the following medications: live virus vaccines This medicine may also interact with the following medications: aprepitant certain antibiotics like erythromycin or clarithromycin certain antivirals for HIV or hepatitis certain medicines for fungal infections like fluconazole, itraconazole, ketoconazole, posaconazole, or voriconazole cimetidine ciprofloxacin conivaptan cyclosporine dronedarone fluvoxamine grapefruit juice imatinib verapamil This list may not describe all possible interactions. Give your health care provider a list of all the medicines, herbs, non-prescription drugs, or dietary supplements you use. Also tell them if you smoke, drink alcohol, or use illegal drugs. Some items may interact with your medicine. What should I watch for while using this medication? Your condition will be monitored carefully while you are  receiving this medicine. You will need important blood work done while you are taking this medicine. Call your doctor or health care professional for advice if you get a fever, chills or sore throat, or other symptoms of a cold or flu. Do not treat yourself. This drug decreases your body's ability to fight infections. Try to avoid being around people who are sick. Some products may contain alcohol. Ask your health care professional if this medicine contains alcohol. Be sure to tell all health care professionals you are taking this medicine. Certain medicines, like metronidazole and disulfiram, can cause an unpleasant reaction when taken with alcohol. The reaction includes flushing, headache, nausea, vomiting, sweating, and increased thirst. The reaction can last from 30 minutes to several hours. You may get drowsy or dizzy. Do not drive, use machinery, or do anything that needs mental alertness until you know how this medicine affects you. Do not stand or sit up quickly, especially if you are an older patient. This reduces the risk of dizzy or fainting spells. Alcohol may interfere with the effect of this medicine. Talk to your health care professional about your risk of cancer. You may be more at risk for certain types of cancer if you take this medicine. Do not become pregnant while taking this medicine or for 6 months after stopping it. Women should inform their doctor if they wish to become pregnant or think they might be pregnant. There is a potential for serious side effects to an unborn child.  Talk to your health care professional or pharmacist for more information. Do not breast-feed an infant while taking this medicine or for 1 week after stopping it. Males who get this medicine must use a condom during sex with females who can get pregnant. If you get a woman pregnant, the baby could have birth defects. The baby could die before they are born. You will need to continue wearing a condom for 3 months  after stopping the medicine. Tell your health care provider right away if your partner becomes pregnant while you are taking this medicine. This may interfere with the ability to father a child. You should talk to your doctor or health care professional if you are concerned about your fertility. What side effects may I notice from receiving this medication? Side effects that you should report to your doctor or health care professional as soon as possible: allergic reactions like skin rash, itching or hives, swelling of the face, lips, or tongue blurred vision breathing problems changes in vision low blood counts - This drug may decrease the number of white blood cells, red blood cells and platelets. You may be at increased risk for infections and bleeding. nausea and vomiting pain, redness or irritation at site where injected pain, tingling, numbness in the hands or feet redness, blistering, peeling, or loosening of the skin, including inside the mouth signs of decreased platelets or bleeding - bruising, pinpoint red spots on the skin, black, tarry stools, nosebleeds signs of decreased red blood cells - unusually weak or tired, fainting spells, lightheadedness signs of infection - fever or chills, cough, sore throat, pain or difficulty passing urine swelling of the ankle, feet, hands Side effects that usually do not require medical attention (report to your doctor or health care professional if they continue or are bothersome): constipation diarrhea fingernail or toenail changes hair loss loss of appetite mouth sores muscle pain This list may not describe all possible side effects. Call your doctor for medical advice about side effects. You may report side effects to FDA at 1-800-FDA-1088. Where should I keep my medication? This drug is given in a hospital or clinic and will not be stored at home. NOTE: This sheet is a summary. It may not cover all possible information. If you have  questions about this medicine, talk to your doctor, pharmacist, or health care provider.  2023 Elsevier/Gold Standard (2021-04-24 00:00:00)  Carboplatin injection What is this medication? CARBOPLATIN (KAR boe pla tin) is a chemotherapy drug. It targets fast dividing cells, like cancer cells, and causes these cells to die. This medicine is used to treat ovarian cancer and many other cancers. This medicine may be used for other purposes; ask your health care provider or pharmacist if you have questions. COMMON BRAND NAME(S): Paraplatin What should I tell my care team before I take this medication? They need to know if you have any of these conditions: blood disorders hearing problems kidney disease recent or ongoing radiation therapy an unusual or allergic reaction to carboplatin, cisplatin, other chemotherapy, other medicines, foods, dyes, or preservatives pregnant or trying to get pregnant breast-feeding How should I use this medication? This drug is usually given as an infusion into a vein. It is administered in a hospital or clinic by a specially trained health care professional. Talk to your pediatrician regarding the use of this medicine in children. Special care may be needed. Overdosage: If you think you have taken too much of this medicine contact a poison control center or emergency  room at once. NOTE: This medicine is only for you. Do not share this medicine with others. What if I miss a dose? It is important not to miss a dose. Call your doctor or health care professional if you are unable to keep an appointment. What may interact with this medication? medicines for seizures medicines to increase blood counts like filgrastim, pegfilgrastim, sargramostim some antibiotics like amikacin, gentamicin, neomycin, streptomycin, tobramycin vaccines Talk to your doctor or health care professional before taking any of these  medicines: acetaminophen aspirin ibuprofen ketoprofen naproxen This list may not describe all possible interactions. Give your health care provider a list of all the medicines, herbs, non-prescription drugs, or dietary supplements you use. Also tell them if you smoke, drink alcohol, or use illegal drugs. Some items may interact with your medicine. What should I watch for while using this medication? Your condition will be monitored carefully while you are receiving this medicine. You will need important blood work done while you are taking this medicine. This drug may make you feel generally unwell. This is not uncommon, as chemotherapy can affect healthy cells as well as cancer cells. Report any side effects. Continue your course of treatment even though you feel ill unless your doctor tells you to stop. In some cases, you may be given additional medicines to help with side effects. Follow all directions for their use. Call your doctor or health care professional for advice if you get a fever, chills or sore throat, or other symptoms of a cold or flu. Do not treat yourself. This drug decreases your body's ability to fight infections. Try to avoid being around people who are sick. This medicine may increase your risk to bruise or bleed. Call your doctor or health care professional if you notice any unusual bleeding. Be careful brushing and flossing your teeth or using a toothpick because you may get an infection or bleed more easily. If you have any dental work done, tell your dentist you are receiving this medicine. Avoid taking products that contain aspirin, acetaminophen, ibuprofen, naproxen, or ketoprofen unless instructed by your doctor. These medicines may hide a fever. Do not become pregnant while taking this medicine. Women should inform their doctor if they wish to become pregnant or think they might be pregnant. There is a potential for serious side effects to an unborn child. Talk to your  health care professional or pharmacist for more information. Do not breast-feed an infant while taking this medicine. What side effects may I notice from receiving this medication? Side effects that you should report to your doctor or health care professional as soon as possible: allergic reactions like skin rash, itching or hives, swelling of the face, lips, or tongue signs of infection - fever or chills, cough, sore throat, pain or difficulty passing urine signs of decreased platelets or bleeding - bruising, pinpoint red spots on the skin, black, tarry stools, nosebleeds signs of decreased red blood cells - unusually weak or tired, fainting spells, lightheadedness breathing problems changes in hearing changes in vision chest pain high blood pressure low blood counts - This drug may decrease the number of white blood cells, red blood cells and platelets. You may be at increased risk for infections and bleeding. nausea and vomiting pain, swelling, redness or irritation at the injection site pain, tingling, numbness in the hands or feet problems with balance, talking, walking trouble passing urine or change in the amount of urine Side effects that usually do not require medical attention (report  to your doctor or health care professional if they continue or are bothersome): hair loss loss of appetite metallic taste in the mouth or changes in taste This list may not describe all possible side effects. Call your doctor for medical advice about side effects. You may report side effects to FDA at 1-800-FDA-1088. Where should I keep my medication? This drug is given in a hospital or clinic and will not be stored at home. NOTE: This sheet is a summary. It may not cover all possible information. If you have questions about this medicine, talk to your doctor, pharmacist, or health care provider.  2023 Elsevier/Gold Standard (2007-11-01 00:00:00)  Trastuzumab injection for infusion What is this  medication? TRASTUZUMAB (tras TOO zoo mab) is a monoclonal antibody. It is used to treat breast cancer and stomach cancer. This medicine may be used for other purposes; ask your health care provider or pharmacist if you have questions. COMMON BRAND NAME(S): Herceptin, Janae Bridgeman, Ontruzant, Trazimera What should I tell my care team before I take this medication? They need to know if you have any of these conditions: heart disease heart failure lung or breathing disease, like asthma an unusual or allergic reaction to trastuzumab, benzyl alcohol, or other medications, foods, dyes, or preservatives pregnant or trying to get pregnant breast-feeding How should I use this medication? This drug is given as an infusion into a vein. It is administered in a hospital or clinic by a specially trained health care professional. Talk to your pediatrician regarding the use of this medicine in children. This medicine is not approved for use in children. Overdosage: If you think you have taken too much of this medicine contact a poison control center or emergency room at once. NOTE: This medicine is only for you. Do not share this medicine with others. What if I miss a dose? It is important not to miss a dose. Call your doctor or health care professional if you are unable to keep an appointment. What may interact with this medication? This medicine may interact with the following medications: certain types of chemotherapy, such as daunorubicin, doxorubicin, epirubicin, and idarubicin This list may not describe all possible interactions. Give your health care provider a list of all the medicines, herbs, non-prescription drugs, or dietary supplements you use. Also tell them if you smoke, drink alcohol, or use illegal drugs. Some items may interact with your medicine. What should I watch for while using this medication? Visit your doctor for checks on your progress. Report any side effects.  Continue your course of treatment even though you feel ill unless your doctor tells you to stop. Call your doctor or health care professional for advice if you get a fever, chills or sore throat, or other symptoms of a cold or flu. Do not treat yourself. Try to avoid being around people who are sick. You may experience fever, chills and shaking during your first infusion. These effects are usually mild and can be treated with other medicines. Report any side effects during the infusion to your health care professional. Fever and chills usually do not happen with later infusions. Do not become pregnant while taking this medicine or for 7 months after stopping it. Women should inform their doctor if they wish to become pregnant or think they might be pregnant. Women of child-bearing potential will need to have a negative pregnancy test before starting this medicine. There is a potential for serious side effects to an unborn child. Talk to your health care  professional or pharmacist for more information. Do not breast-feed an infant while taking this medicine or for 7 months after stopping it. Women must use effective birth control with this medicine. What side effects may I notice from receiving this medication? Side effects that you should report to your doctor or health care professional as soon as possible: allergic reactions like skin rash, itching or hives, swelling of the face, lips, or tongue chest pain or palpitations cough dizziness feeling faint or lightheaded, falls fever general ill feeling or flu-like symptoms signs of worsening heart failure like breathing problems; swelling in your legs and feet unusually weak or tired Side effects that usually do not require medical attention (report to your doctor or health care professional if they continue or are bothersome): bone pain changes in taste diarrhea joint pain nausea/vomiting weight loss This list may not describe all possible side  effects. Call your doctor for medical advice about side effects. You may report side effects to FDA at 1-800-FDA-1088. Where should I keep my medication? This drug is given in a hospital or clinic and will not be stored at home. NOTE: This sheet is a summary. It may not cover all possible information. If you have questions about this medicine, talk to your doctor, pharmacist, or health care provider.  2023 Elsevier/Gold Standard (2016-06-08 00:00:00)  Pertuzumab injection What is this medication? PERTUZUMAB (per TOOZ ue mab) is a monoclonal antibody. It is used to treat breast cancer. This medicine may be used for other purposes; ask your health care provider or pharmacist if you have questions. COMMON BRAND NAME(S): PERJETA What should I tell my care team before I take this medication? They need to know if you have any of these conditions: heart disease heart failure high blood pressure history of irregular heart beat recent or ongoing radiation therapy an unusual or allergic reaction to pertuzumab, other medicines, foods, dyes, or preservatives pregnant or trying to get pregnant breast-feeding How should I use this medication? This medicine is for infusion into a vein. It is given by a health care professional in a hospital or clinic setting. Talk to your pediatrician regarding the use of this medicine in children. Special care may be needed. Overdosage: If you think you have taken too much of this medicine contact a poison control center or emergency room at once. NOTE: This medicine is only for you. Do not share this medicine with others. What if I miss a dose? It is important not to miss your dose. Call your doctor or health care professional if you are unable to keep an appointment. What may interact with this medication? Interactions are not expected. Give your health care provider a list of all the medicines, herbs, non-prescription drugs, or dietary supplements you use. Also  tell them if you smoke, drink alcohol, or use illegal drugs. Some items may interact with your medicine. This list may not describe all possible interactions. Give your health care provider a list of all the medicines, herbs, non-prescription drugs, or dietary supplements you use. Also tell them if you smoke, drink alcohol, or use illegal drugs. Some items may interact with your medicine. What should I watch for while using this medication? Your condition will be monitored carefully while you are receiving this medicine. Report any side effects. Continue your course of treatment even though you feel ill unless your doctor tells you to stop. Do not become pregnant while taking this medicine or for 7 months after stopping it. Women should inform their  doctor if they wish to become pregnant or think they might be pregnant. Women of child-bearing potential will need to have a negative pregnancy test before starting this medicine. There is a potential for serious side effects to an unborn child. Talk to your health care professional or pharmacist for more information. Do not breast-feed an infant while taking this medicine or for 7 months after stopping it. Women must use effective birth control with this medicine. Call your doctor or health care professional for advice if you get a fever, chills or sore throat, or other symptoms of a cold or flu. Do not treat yourself. Try to avoid being around people who are sick. You may experience fever, chills, and headache during the infusion. Report any side effects during the infusion to your health care professional. What side effects may I notice from receiving this medication? Side effects that you should report to your doctor or health care professional as soon as possible: breathing problems chest pain or palpitations dizziness feeling faint or lightheaded fever or chills skin rash, itching or hives sore throat swelling of the face, lips, or tongue swelling  of the legs or ankles unusually weak or tired Side effects that usually do not require medical attention (report to your doctor or health care professional if they continue or are bothersome): diarrhea hair loss nausea, vomiting tiredness This list may not describe all possible side effects. Call your doctor for medical advice about side effects. You may report side effects to FDA at 1-800-FDA-1088. Where should I keep my medication? This drug is given in a hospital or clinic and will not be stored at home. NOTE: This sheet is a summary. It may not cover all possible information. If you have questions about this medicine, talk to your doctor, pharmacist, or health care provider.  2023 Elsevier/Gold Standard (2015-06-26 00:00:00)

## 2021-11-12 ENCOUNTER — Telehealth: Payer: Self-pay

## 2021-11-12 NOTE — Telephone Encounter (Signed)
Telephone call to patient for follow up after receiving first infusion.   Patient states infusion went great.  States eating good and drinking plenty of fluids.   Denies any nausea or vomiting.  Encouraged patient to call for any concerns or questions. 

## 2021-11-13 ENCOUNTER — Inpatient Hospital Stay: Payer: 59

## 2021-11-13 DIAGNOSIS — Z5112 Encounter for antineoplastic immunotherapy: Secondary | ICD-10-CM | POA: Diagnosis not present

## 2021-11-13 DIAGNOSIS — Z170 Estrogen receptor positive status [ER+]: Secondary | ICD-10-CM

## 2021-11-13 MED ORDER — PEGFILGRASTIM-BMEZ 6 MG/0.6ML ~~LOC~~ SOSY
6.0000 mg | PREFILLED_SYRINGE | Freq: Once | SUBCUTANEOUS | Status: AC
  Administered 2021-11-13: 6 mg via SUBCUTANEOUS

## 2021-11-16 ENCOUNTER — Other Ambulatory Visit: Payer: Self-pay

## 2021-11-16 ENCOUNTER — Encounter: Payer: Self-pay | Admitting: *Deleted

## 2021-11-16 ENCOUNTER — Encounter: Payer: Self-pay | Admitting: Internal Medicine

## 2021-11-16 ENCOUNTER — Encounter: Payer: Self-pay | Admitting: Hospice and Palliative Medicine

## 2021-11-16 ENCOUNTER — Inpatient Hospital Stay: Payer: 59 | Attending: Hospice and Palliative Medicine

## 2021-11-16 ENCOUNTER — Inpatient Hospital Stay (HOSPITAL_BASED_OUTPATIENT_CLINIC_OR_DEPARTMENT_OTHER): Payer: 59 | Admitting: Hospice and Palliative Medicine

## 2021-11-16 ENCOUNTER — Ambulatory Visit
Admission: RE | Admit: 2021-11-16 | Discharge: 2021-11-16 | Disposition: A | Discharge status: Home / Self Care | Payer: 59 | Source: Ambulatory Visit | Attending: Internal Medicine | Admitting: Internal Medicine

## 2021-11-16 VITALS — BP 111/41 | HR 97 | Temp 99.5°F | Resp 16 | Ht 65.0 in | Wt 221.0 lb

## 2021-11-16 DIAGNOSIS — C50411 Malignant neoplasm of upper-outer quadrant of right female breast: Secondary | ICD-10-CM | POA: Insufficient documentation

## 2021-11-16 DIAGNOSIS — Z170 Estrogen receptor positive status [ER+]: Secondary | ICD-10-CM

## 2021-11-16 DIAGNOSIS — Z5112 Encounter for antineoplastic immunotherapy: Secondary | ICD-10-CM | POA: Diagnosis not present

## 2021-11-16 DIAGNOSIS — R5383 Other fatigue: Secondary | ICD-10-CM

## 2021-11-16 DIAGNOSIS — R234 Changes in skin texture: Secondary | ICD-10-CM | POA: Insufficient documentation

## 2021-11-16 DIAGNOSIS — Z5189 Encounter for other specified aftercare: Secondary | ICD-10-CM | POA: Insufficient documentation

## 2021-11-16 DIAGNOSIS — Z5111 Encounter for antineoplastic chemotherapy: Secondary | ICD-10-CM | POA: Insufficient documentation

## 2021-11-16 DIAGNOSIS — E860 Dehydration: Secondary | ICD-10-CM | POA: Insufficient documentation

## 2021-11-16 DIAGNOSIS — C773 Secondary and unspecified malignant neoplasm of axilla and upper limb lymph nodes: Secondary | ICD-10-CM | POA: Insufficient documentation

## 2021-11-16 DIAGNOSIS — E119 Type 2 diabetes mellitus without complications: Secondary | ICD-10-CM | POA: Insufficient documentation

## 2021-11-16 LAB — CBC WITH DIFFERENTIAL/PLATELET
Abs Immature Granulocytes: 0.2 10*3/uL — ABNORMAL HIGH (ref 0.00–0.07)
Basophils Absolute: 0 10*3/uL (ref 0.0–0.1)
Basophils Relative: 0 %
Eosinophils Absolute: 0.2 10*3/uL (ref 0.0–0.5)
Eosinophils Relative: 5 %
HCT: 40.3 % (ref 36.0–46.0)
Hemoglobin: 13.2 g/dL (ref 12.0–15.0)
Lymphocytes Relative: 22 %
Lymphs Abs: 0.7 10*3/uL (ref 0.7–4.0)
MCH: 28.1 pg (ref 26.0–34.0)
MCHC: 32.8 g/dL (ref 30.0–36.0)
MCV: 85.7 fL (ref 80.0–100.0)
Metamyelocytes Relative: 1 %
Monocytes Absolute: 0.1 10*3/uL (ref 0.1–1.0)
Monocytes Relative: 3 %
Myelocytes: 5 %
Neutro Abs: 2 10*3/uL (ref 1.7–7.7)
Neutrophils Relative %: 64 %
Platelets: 184 10*3/uL (ref 150–400)
RBC: 4.7 MIL/uL (ref 3.87–5.11)
RDW: 12.6 % (ref 11.5–15.5)
Smear Review: NORMAL
WBC: 3.1 10*3/uL — ABNORMAL LOW (ref 4.0–10.5)
nRBC: 0 % (ref 0.0–0.2)

## 2021-11-16 LAB — COMPREHENSIVE METABOLIC PANEL
ALT: 56 U/L — ABNORMAL HIGH (ref 0–44)
AST: 36 U/L (ref 15–41)
Albumin: 3.5 g/dL (ref 3.5–5.0)
Alkaline Phosphatase: 58 U/L (ref 38–126)
Anion gap: 6 (ref 5–15)
BUN: 12 mg/dL (ref 6–20)
CO2: 29 mmol/L (ref 22–32)
Calcium: 8.3 mg/dL — ABNORMAL LOW (ref 8.9–10.3)
Chloride: 101 mmol/L (ref 98–111)
Creatinine, Ser: 0.7 mg/dL (ref 0.44–1.00)
GFR, Estimated: >60 mL/min (ref >60)
Glucose, Bld: 142 mg/dL — ABNORMAL HIGH (ref 70–99)
Potassium: 3.7 mmol/L (ref 3.5–5.1)
Sodium: 136 mmol/L (ref 135–145)
Total Bilirubin: 0.4 mg/dL (ref 0.3–1.2)
Total Protein: 7 g/dL (ref 6.5–8.1)

## 2021-11-16 MED ORDER — GADOBUTROL 1 MMOL/ML IV SOLN
10.0000 mL | Freq: Once | INTRAVENOUS | Status: AC | PRN
  Administered 2021-11-16: 10 mL via INTRAVENOUS

## 2021-11-16 MED ORDER — OYSTER SHELL CALCIUM/D3 500-5 MG-MCG PO TABS: 1.0000 | ORAL_TABLET | Freq: Two times a day (BID) | ORAL | 2 refills | Status: AC

## 2021-11-16 NOTE — Progress Notes (Signed)
Called Marilyn Page to check in and see how she was doing.   She had a lot of fatigue this weekend but is starting to feel better today.  She also had 2 mild nose bleeds that she is wondering if maybe the claritin is drying her sinuses out.  Dr. Sharmaine Base team is checking to see if he would like her seen by [facility] or not today.

## 2021-11-16 NOTE — Progress Notes (Signed)
Symptom Management [place] at [facility] Telephone:([phone] [phone]  Patient Care Team: Latanya Maudlin, NP as PCP - General (Family Medicine) Daiva Huge, RN as Oncology Nurse Navigator Cammie Sickle, [name]   Name of the patient: Marilyn Page  [id number]  10/02/1975   Date of visit: 11/16/21  Reason for Consult: Marilyn Page is a 46 y.o. 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   Patient last saw Dr. Rogue Bussing on 11/10/2021 and received cycle 1 neoadjuvant TCHP chemotherapy on 11/11/2021.  She received Udenyca on 11/13/2021.  Patient is pending a breast MRI today.  Patient presents [facility] for evaluation of fatigue with intermittent nosebleeds over the weekend.  Patient reports that she has felt fatigued since receiving chemotherapy last week.  However, she attributes some of this to having an eventful weekend filled with activities due to her daughter's graduation.  She does endorse a brief episode of epistaxis over the weekend and then again this morning.  Both started spontaneously but stopped after a brief period holding pressure.  She denies fever or chills.  No respiratory or GI or GU symptoms.  Denies any neurologic complaints. Denies recent fevers or illnesses. Denies any easy bleeding or bruising. Reports good appetite and denies weight loss. Denies chest pain. Denies any nausea, vomiting, constipation, or diarrhea. Denies urinary complaints. Patient offers no further specific complaints today.  PAST MEDICAL HISTORY: Past Medical History:  Diagnosis Date   Carcinoma of upper-outer quadrant of right breast in female, estrogen receptor positive ([place]) 10/26/2021   Diabetes mellitus without complication ([place])    History of gestational diabetes     PAST SURGICAL HISTORY:  Past Surgical History:  Procedure Laterality Date   BREAST BIOPSY Right  10/19/2021   [place] Core bx 10:00 6 cmfn Ribbon Clip. Axilla Butterfly hydro clip- path pending   CESAREAN SECTION  2005/2008   DILATION AND CURETTAGE OF UTERUS  2003   PORTACATH PLACEMENT Left 11/09/2021   Procedure: INSERTION PORT-A-CATH;  Surgeon: Robert Bellow, MD;  Location: [facility] ORS;  Service: General;  Laterality: Left;   SKIN BIOPSY Right 11/09/2021   Procedure: PUNCH BIOPSY SKIN, TATTOO LYMPH NODE RIGHT AXILLA;  Surgeon: Robert Bellow, MD;  Location: [facility] ORS;  Service: General;  Laterality: Right;    HEMATOLOGY/ONCOLOGY HISTORY:  Oncology History Overview Note  MAY 2023-    Targeted ultrasound is performed, showing a 2.9 x 1.9 x 2.0 cm irregular hypoechoic mass right breast 10 o'clock position 6 cm from nipple at the site of palpable concern.   There is an abnormal 8 mm thickened right axillary lymph node.   There is skin thickening overlying the right breast. ------------------  A. BREAST, RIGHT AT 10:00, 6 CM FROM THE NIPPLE; ULTRASOUND-GUIDED CORE  NEEDLE BIOPSY:  - INVASIVE MAMMARY CARCINOMA, NO SPECIAL TYPE.   Size of invasive carcinoma: 11 mm in this sample  Histologic grade of invasive carcinoma: Grade 2                       Glandular/tubular differentiation score: 3                       Nuclear pleomorphism score: 2                       Mitotic rate score: 1  Total score: 6  Ductal carcinoma in situ: Present, intermediate grade  Lymphovascular invasion: Not identified   B. LYMPH NODE, RIGHT AXILLARY; ULTRASOUND-GUIDED CORE NEEDLE BIOPSY:  - 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 IN GREATEST  EXTENT.   Comment:  The malignancy in the primary breast lesion appears morphologically  different from tumor in the lymph node sampling, with tumor present in  lymph node more suggestive of a histologic grade 3, with significantly  increased mitotic activity and pleomorphism. Due to the discrepancy in  these morphologic  patterns, ER/PR/HER2 immunohistochemistry will be  performed on both blocks A1 and B1, with reflex to [place] for HER2 2+. The  results will be reported in an addendum.   ADDENDUM:  CASE SUMMARY: BREAST BIOMARKER TESTS - A - BREAST, RIGHT AT 10:00  Estrogen Receptor (ER) Status: POSITIVE          Percentage of cells with nuclear positivity: 71-80%          Average intensity of staining: Strong   Progesterone Receptor (PgR) Status: POSITIVE          Percentage of cells with nuclear positivity: 81-90%          Average intensity of staining: Strong   HER2 (by immunohistochemistry): POSITIVE (Score 3+)       Percentage of cells with uniform intense complete membrane  staining: 61-70%  HER2 displays a heterogeneous staining pattern, with areas showing a  strong 3+ staining pattern, and other regions with complete absence (0+)  of staining.   Ki-67: Not performed   CASE SUMMARY: BREAST BIOMARKER TESTS - B - RIGHT AXILLARY LYMPH NODE  Estrogen Receptor (ER) Status: POSITIVE          Percentage of cells with nuclear positivity: 81-90%          Average intensity of staining: Strong   Progesterone Receptor (PgR) Status: POSITIVE          Percentage of cells with nuclear positivity: 51-60%          Average intensity of staining: Strong   HER2 (by immunohistochemistry): NEGATIVE (Score 1+)  Ki-67: Not performed   # June 7th, 2023- TCH+P; skin biopsy pending; June 6th PET-pending    Carcinoma of upper-outer quadrant of right breast in female, estrogen receptor positive ([place])  10/26/2021 Initial Diagnosis   Carcinoma of upper-outer quadrant of right breast in female, estrogen receptor positive ([place])   10/26/2021 Cancer Staging   Staging form: Breast, AJCC 8th Edition - Clinical: Stage IB (cT2, cN1, cM0, G2, ER+, PR+, HER2+) - Signed by Cammie Sickle, MD on 10/26/2021 Histologic grading system: 3 grade system   11/11/2021 -  Chemotherapy   Patient is on Treatment Plan : BREAST   Docetaxel + Carboplatin + Trastuzumab + Pertuzumab  (TCHP) q21d        ALLERGIES:  is allergic to metformin.  MEDICATIONS:  Current Outpatient Medications  Medication Sig Dispense Refill   Alcohol Swabs (ALCOHOL WIPES) 70 % PADS Apply topically.     Cholecalciferol 50 MCG (2000 UT) CAPS Take by mouth. (Patient not taking: Reported on 11/10/2021)     dexamethasone (DECADRON) 4 MG tablet One pill twice a day - Start the day prior to chemo; do not take the day of chemo. 40 tablet 0   ferrous sulfate 325 (65 FE) MG tablet Take 325 mg by mouth 2 (two) times daily with a meal.     lidocaine-prilocaine (EMLA) cream Apply on the port. 30 -  45 min  prior to port access. 30 g 3   norethindrone (MICRONOR) 0.35 MG tablet Take 1 tablet (0.35 mg total) by mouth daily. 84 tablet 2   ondansetron (ZOFRAN-ODT) 8 MG disintegrating tablet Take 1 tablet (8 mg total) by mouth every 8 (eight) hours as needed for nausea or vomiting. 45 tablet 1   ONETOUCH VERIO test strip SMARTSIG:1 Each Via Meter Daily PRN     OZEMPIC, 0.25 OR 0.5 MG/DOSE, 2 MG/1.5ML SOPN PLEASE SEE ATTACHED FOR DETAILED DIRECTIONS     prochlorperazine (COMPAZINE) 10 MG tablet Take 1 tablet (10 mg total) by mouth every 6 (six) hours as needed for nausea or vomiting. 40 tablet 1   No current facility-administered medications for this visit.    VITAL SIGNS: LMP 11/06/2021 (Exact Date)  There were no vitals filed for this visit.  Estimated body mass index is 37.42 kg/m as calculated from the following:   Height as of 11/11/21: 5' 5"  (1.651 m).   Weight as of 11/11/21: 224 lb 13.9 oz (102 kg).  LABS: CBC:    Component Value Date/Time   WBC 9.3 11/10/2021 1510   HGB 13.1 11/10/2021 1510   HGB 7.8 (L) 04/23/2021 1431   HCT 39.1 11/10/2021 1510   HCT 25.5 (L) 04/23/2021 1431   PLT 262 11/10/2021 1510   PLT 384 04/23/2021 1431   MCV 85.2 11/10/2021 1510   MCV 84 04/23/2021 1431   NEUTROABS 6.4 11/10/2021 1510   NEUTROABS 5.4 04/23/2021 1431    LYMPHSABS 1.9 11/10/2021 1510   LYMPHSABS 2.2 04/23/2021 1431   MONOABS 0.7 11/10/2021 1510   EOSABS 0.2 11/10/2021 1510   EOSABS 0.2 04/23/2021 1431   BASOSABS 0.1 11/10/2021 1510   BASOSABS 0.1 04/23/2021 1431   Comprehensive Metabolic Panel:    Component Value Date/Time   NA 141 11/10/2021 1510   NA 143 09/26/2017 1512   K 3.4 (L) 11/10/2021 1510   CL 106 11/10/2021 1510   CO2 28 11/10/2021 1510   BUN 11 11/10/2021 1510   BUN 9 09/26/2017 1512   CREATININE 0.78 11/10/2021 1510   GLUCOSE 107 (H) 11/10/2021 1510   CALCIUM 8.6 (L) 11/10/2021 1510   AST 17 11/10/2021 1510   ALT 17 11/10/2021 1510   ALKPHOS 54 11/10/2021 1510   BILITOT 0.4 11/10/2021 1510   BILITOT <0.2 09/26/2017 1512   PROT 7.1 11/10/2021 1510   PROT 6.8 09/26/2017 1512   ALBUMIN 3.5 11/10/2021 1510   ALBUMIN 3.9 09/26/2017 1512    RADIOGRAPHIC STUDIES: NM PET Image Initial (PI) Skull Base To Thigh (F-18 FDG)  Result Date: 11/11/2021 CLINICAL DATA:  Initial treatment strategy for new diagnosis of right breast cancer. EXAM: NUCLEAR MEDICINE PET SKULL BASE TO THIGH TECHNIQUE: 12.33 mCi F-18 FDG was injected intravenously. Full-ring PET imaging was performed from the skull base to thigh after the radiotracer. CT data was obtained and used for attenuation correction and anatomic localization. Fasting blood glucose: 89 mg/dl COMPARISON:  Recent mammograms and breast ultrasound. FINDINGS: Mediastinal blood pool activity: SUV max 1.88 Liver activity: SUV max NA NECK: No hypermetabolic lymph nodes in the neck. Incidental CT findings: none CHEST: Chest 3 cm right lateral breast mass is hypermetabolic with SUV max of 1.63 and consistent with known breast cancer. Adjacent fluid collection is likely postop/post biopsy liquified hematoma. A small biopsy clip is noted. Small scattered right axillary lymph nodes demonstrate mild hypermetabolism with SUV max of 5.08. These are suspicious for metastatic disease. No enlarged or  hypermetabolic mediastinal or hilar lymph nodes. No worrisome pulmonary nodules to suggest pulmonary metastatic disease. No left-sided breast mass. Incidental CT findings: The left-sided Port-A-Cath is in good position without complicating features. ABDOMEN/PELVIS: No abnormal hypermetabolic activity within the liver, pancreas, adrenal glands, or spleen. No hypermetabolic lymph nodes in the abdomen or pelvis. Incidental CT findings: none SKELETON: No focal hypermetabolic activity to suggest skeletal metastasis. Incidental CT findings: none IMPRESSION: 1. Hypermetabolic right lateral breast mass consistent with known breast cancer. 2. Small but hypermetabolic right axillary nodes suggesting metastatic adenopathy. 3. No findings for metastatic disease involving the chest, abdomen/pelvis or bony structures. Electronically Signed   By: Marijo Sanes M.D.   On: 11/11/2021 15:52   DG Chest Port 1 View  Result Date: 11/09/2021 CLINICAL DATA:  Port-A-Cath insertion. EXAM: PORTABLE CHEST 1 VIEW COMPARISON:  None Available. FINDINGS: Heart size is normal. A left subclavian Port-A-Cath is in place. The tip of the catheter is in the right atrium, proximally 4 cm beyond the cavoatrial junction. Lung volumes are low. Mild atelectasis is present at the lung bases. No pneumothorax is present. IMPRESSION: 1. Tip of left subclavian Port-A-Cath is in the right atrium, approximately 4 cm beyond the cavoatrial junction. 2. Low lung volumes and mild bibasilar atelectasis. These results will be called to the ordering clinician or representative by the Radiologist Assistant, and communication documented in the PACS or [employer]. Electronically Signed   By: San Morelle M.D.   On: 11/09/2021 10:06   DG C-Arm 1-60 Min-No Report  Result Date: 11/09/2021 Fluoroscopy was utilized by the requesting physician.  No radiographic interpretation.   NM Cardiac Muga Rest  Result Date: 11/04/2021 CLINICAL DATA:  Breast cancer.   Pre chemotherapy. EXAM: NUCLEAR MEDICINE CARDIAC BLOOD POOL IMAGING (MUGA) TECHNIQUE: Cardiac multi-gated acquisition was performed at rest following intravenous injection of Tc-55mlabeled red blood cells. RADIOPHARMACEUTICALS:  21.24 mCi Tc-942mertechnetate in-vitro labeled red blood cells IV COMPARISON:  None Available. FINDINGS: The seen a images demonstrate normal left ventricular wall motion. No akinetic or dyskinetic segments are identified. The calculated ejection fraction is 62.1. IMPRESSION: Ejection fraction is 62.1 Electronically Signed   By: P.Marijo Sanes.D.   On: 11/04/2021 16:02   [place] NODE CORE BIOPSY RIGHT  Addendum Date: 10/21/2021   ADDENDUM REPORT: 10/21/2021 09:35 ADDENDUM: Pathology revealed GRADE II INVASIVE MAMMARY CARCINOMA, NO SPECIAL TYPE, INTERMEDIATE GRADE DUCTAL CARCINOMA IN SITU of the RIGHT breast, 10 o'clock, 6 cmfn (ribbon clip). This was found to be concordant by Dr. MiAmmie FerrierPathology revealed 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 IN GREATEST EXTENT of the RIGHT axillary lymph node (HydroMARK butterfly-shaped clip). This was found to be concordant by Dr. MiAmmie FerrierPathology results were discussed with the patient by telephone. The patient reported doing well after the biopsies with tenderness at the sites. Post biopsy instructions and care were reviewed and questions were answered. The patient was encouraged to call [facility] any additional concerns. Recommendations: Breast MRI for extent of disease per diagnostic report given dense breast tissue and premenopausal age. Also, note that there are at least 3 abnormal lymph nodes that were found in the right axilla at the time of biopsy. MRI may help reassess the axilla. Biopsy results called [facility] CrPatent examinert [facility] 10/21/2021 and sent secure email to [facility] Oncology Navigator 10/20/2021.  Pathology results reported by SuStacie AcresN on 10/21/2021. Electronically  Signed   By: Ammie Ferrier M.D.   On: 10/21/2021 09:35   Result Date: 10/21/2021 CLINICAL DATA:  46 year old female presenting for ultrasound-guided biopsy of a right breast mass and right axillary lymph node. EXAM: ULTRASOUND GUIDED RIGHT BREAST CORE NEEDLE BIOPSY COMPARISON:  None Available. PROCEDURE: I met with the patient and we discussed the procedure of ultrasound-guided biopsy, including benefits and alternatives. We discussed the high likelihood of a successful procedure. We discussed the risks of the procedure, including infection, bleeding, tissue injury, clip migration, and inadequate sampling. Informed written consent was given. The usual time-out protocol was performed immediately prior to the procedure. Lesion quadrant: Upper outer quadrant Using sterile technique and 1% Lidocaine as local anesthetic, under direct ultrasound visualization, a 14 gauge spring-loaded device was used to perform biopsy of a mass in the right breast at 10 o'clock, 6 cm from the nipple using an inferior approach. At the conclusion of the procedure a ribbon shaped tissue marker clip was deployed into the biopsy cavity. Lesion quadrant: Right axilla Upon scanning in preparation for the biopsy, I noted at least 3 level 1 lymph nodes clustered together that appear abnormal. I biopsied one of the lymph nodes in this cluster. Using sterile technique and 1% Lidocaine as local anesthetic, under direct ultrasound visualization, a 14 gauge spring-loaded device was used to perform biopsy of a right axillary lymph node using an inferior approach. At the conclusion of the procedure HydroMARK butterfly shaped tissue marker clip was deployed into the biopsy cavity. Follow up 2 view mammogram was performed and dictated separately. IMPRESSION: 1. Ultrasound guided biopsy of a mass in the right breast at 10 o'clock. No apparent complications. 2. Ultrasound-guided  biopsy of a right axillary lymph node. No apparent complications. Note that there are at least 3 clustered level 1 abnormal lymph nodes. Electronically Signed: By: Ammie Ferrier M.D. On: 10/19/2021 09:16   [place] RT BREAST BX W LOC DEV 1ST LESION IMG BX SPEC US GUIDE  Addendum Date: 10/21/2021   ADDENDUM REPORT: 10/21/2021 09:35 ADDENDUM: Pathology revealed GRADE II INVASIVE MAMMARY CARCINOMA, NO SPECIAL TYPE, INTERMEDIATE GRADE DUCTAL CARCINOMA IN SITU of the RIGHT breast, 10 o'clock, 6 cmfn (ribbon clip). This was found to be concordant by Dr. Ammie Ferrier. Pathology revealed 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 IN GREATEST EXTENT of the RIGHT axillary lymph node (HydroMARK butterfly-shaped clip). This was found to be concordant by Dr. Ammie Ferrier. Pathology results were discussed with the patient by telephone. The patient reported doing well after the biopsies with tenderness at the sites. Post biopsy instructions and care were reviewed and questions were answered. The patient was encouraged to call [facility] for any additional concerns. Recommendations: Breast MRI for extent of disease per diagnostic report given dense breast tissue and premenopausal age. Also, note that there are at least 3 abnormal lymph nodes that were found in the right axilla at the time of biopsy. MRI may help reassess the axilla. Biopsy results called to Patent examiner at [facility] on 10/21/2021 and sent secure email to [facility] Nurse Oncology Navigator 10/20/2021. Pathology results reported by Stacie Acres RN on 10/21/2021. Electronically Signed   By: Ammie Ferrier M.D.   On: 10/21/2021 09:35   Result Date: 10/21/2021 CLINICAL DATA:  46 year old female presenting for ultrasound-guided biopsy of a right breast mass and right axillary lymph node. EXAM: ULTRASOUND GUIDED RIGHT BREAST CORE NEEDLE BIOPSY COMPARISON:  None Available.  PROCEDURE: I met with the patient and we discussed the procedure of ultrasound-guided biopsy, including benefits and alternatives. We discussed the high likelihood of a successful procedure. We discussed the risks of the procedure, including infection, bleeding, tissue injury, clip migration, and inadequate sampling. Informed written consent was given. The usual time-out protocol was performed immediately prior to the procedure. Lesion quadrant: Upper outer quadrant Using sterile technique and 1% Lidocaine as local anesthetic, under direct ultrasound visualization, a 14 gauge spring-loaded device was used to perform biopsy of a mass in the right breast at 10 o'clock, 6 cm from the nipple using an inferior approach. At the conclusion of the procedure a ribbon shaped tissue marker clip was deployed into the biopsy cavity. Lesion quadrant: Right axilla Upon scanning in preparation for the biopsy, I noted at least 3 level 1 lymph nodes clustered together that appear abnormal. I biopsied one of the lymph nodes in this cluster. Using sterile technique and 1% Lidocaine as local anesthetic, under direct ultrasound visualization, a 14 gauge spring-loaded device was used to perform biopsy of a right axillary lymph node using an inferior approach. At the conclusion of the procedure HydroMARK butterfly shaped tissue marker clip was deployed into the biopsy cavity. Follow up 2 view mammogram was performed and dictated separately. IMPRESSION: 1. Ultrasound guided biopsy of a mass in the right breast at 10 o'clock. No apparent complications. 2. Ultrasound-guided biopsy of a right axillary lymph node. No apparent complications. Note that there are at least 3 clustered level 1 abnormal lymph nodes. Electronically Signed: By: Ammie Ferrier M.D. On: 10/19/2021 09:16   MM CLIP PLACEMENT RIGHT  Result Date: 10/19/2021 CLINICAL DATA:  Post biopsy mammogram of the right breast for clip placement. EXAM: 3D DIAGNOSTIC RIGHT  MAMMOGRAM POST ULTRASOUND BIOPSY COMPARISON:  Previous exam(s). FINDINGS: 3D Mammographic images were obtained following ultrasound guided biopsy of a right breast mass and right axillary lymph node. The biopsy marking clip is in expected position at the site of biopsy. The ribbon shaped biopsy marking clip is placed along the anterior margin of the biopsied mass. IMPRESSION: 1. Appropriate positioning of the ribbon shaped biopsy marking clip at the site of biopsy in the upper-outer right breast. 2. Appropriate positioning of the [facility] shaped biopsy marking clip in the right axilla. Final Assessment: Post Procedure Mammograms for Marker Placement Electronically Signed   By: Ammie Ferrier M.D.   On: 10/19/2021 09:23   PERFORMANCE STATUS (ECOG) : 0 - Asymptomatic  Review of Systems Unless otherwise noted, a complete review of systems is negative.  Physical Exam General: NAD Cardiovascular: regular rate and rhythm Pulmonary: clear anterior/posterior fields Abdomen: soft, nontender, + bowel sounds GU: no suprapubic tenderness Extremities: no edema, no joint deformities Skin: no rashes Neurological: Nonfocal  Assessment and Plan- Patient is a 46 y.o. 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  Patient received first cycle of TCHP chemotherapy last week followed by Ellen Henri.  She presents [facility] for evaluation of fatigue and epistaxis.  Fatigue -likely secondary to chemotherapy.  Discussed importance of activity as well as sleep hygiene.  Labs grossly unchanged from baseline.  Epistaxis -unclear etiology.  She is on antihistamine, which might have contributed.  Would recommend nasal saline and/or humidification. ER precautions reviewed.  Hypocalcemia -patient does started taking calcium plus vitamin D supplement.  Discussed twice daily dosing   Case and plan discussed with Dr. Rogue Bussing. Patient will follow-up next week for labs and to see Judson Roch  [place],  PA-C   Patient expressed understanding and was in agreement with this plan. She also understands that She can call clinic at any time with any questions, concerns, or complaints.   Thank you for allowing me to participate in the care of this very pleasant patient.   Time Total: 15 minutes  Visit consisted of counseling and education dealing with the complex and emotionally intense issues of symptom management in the setting of serious illness.Greater than 50%  of this time was spent counseling and coordinating care related to the above assessment and plan.  Signed by: Altha Harm, PhD, NP-C

## 2021-11-18 ENCOUNTER — Encounter: Payer: Self-pay | Admitting: Licensed Clinical Social Worker

## 2021-11-18 ENCOUNTER — Inpatient Hospital Stay (HOSPITAL_BASED_OUTPATIENT_CLINIC_OR_DEPARTMENT_OTHER): Payer: 59 | Admitting: Licensed Clinical Social Worker

## 2021-11-18 ENCOUNTER — Inpatient Hospital Stay: Payer: 59

## 2021-11-18 DIAGNOSIS — Z800 Family history of malignant neoplasm of digestive organs: Secondary | ICD-10-CM

## 2021-11-18 DIAGNOSIS — C50411 Malignant neoplasm of upper-outer quadrant of right female breast: Secondary | ICD-10-CM | POA: Diagnosis not present

## 2021-11-18 DIAGNOSIS — Z170 Estrogen receptor positive status [ER+]: Secondary | ICD-10-CM

## 2021-11-18 NOTE — Progress Notes (Signed)
REFERRING PROVIDER: Cammie Sickle, MD [address],  [place] [address]  PRIMARY PROVIDER:  Latanya Maudlin, NP  PRIMARY REASON FOR VISIT:  1. Carcinoma of upper-outer quadrant of right breast in female, estrogen receptor positive ([place])   2. Family history of stomach cancer    HISTORY OF PRESENT ILLNESS:   Marilyn Page, a 46 y.o. female, was seen for a [place] cancer genetics consultation at the request of Dr. Rogue Bussing due to a personal and family history of cancer.  Marilyn Page presents to clinic today to discuss the possibility of a hereditary predisposition to cancer, genetic testing, and to further clarify her future cancer risks, as well as potential cancer risks for family members.   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 The treatment plan currently includes neoadjuvant chemotherapy followed by surgery, possible radiation and antiestrogen therapy.  CANCER HISTORY:  Oncology History Overview Note  MAY 2023-    Targeted ultrasound is performed, showing a 2.9 x 1.9 x 2.0 cm irregular hypoechoic mass right breast 10 o'clock position 6 cm from nipple at the site of palpable concern.   There is an abnormal 8 mm thickened right axillary lymph node.   There is skin thickening overlying the right breast. ------------------  A. BREAST, RIGHT AT 10:00, 6 CM FROM THE NIPPLE; ULTRASOUND-GUIDED CORE  NEEDLE BIOPSY:  - INVASIVE MAMMARY CARCINOMA, NO SPECIAL TYPE.   Size of invasive carcinoma: 11 mm in this sample  Histologic grade of invasive carcinoma: Grade 2                       Glandular/tubular differentiation score: 3                       Nuclear pleomorphism score: 2                       Mitotic rate score: 1                       Total score: 6  Ductal carcinoma in situ: Present, intermediate grade  Lymphovascular invasion: Not identified   B. LYMPH NODE, RIGHT AXILLARY;  ULTRASOUND-GUIDED CORE NEEDLE BIOPSY:  - 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 IN GREATEST  EXTENT.   Comment:  The malignancy in the primary breast lesion appears morphologically  different from tumor in the lymph node sampling, with tumor present in  lymph node more suggestive of a histologic grade 3, with significantly  increased mitotic activity and pleomorphism. Due to the discrepancy in  these morphologic patterns, ER/PR/HER2 immunohistochemistry will be  performed on both blocks A1 and B1, with reflex to [place] for HER2 2+. The  results will be reported in an addendum.   ADDENDUM:  CASE SUMMARY: BREAST BIOMARKER TESTS - A - BREAST, RIGHT AT 10:00  Estrogen Receptor (ER) Status: POSITIVE          Percentage of cells with nuclear positivity: 71-80%          Average intensity of staining: Strong   Progesterone Receptor (PgR) Status: POSITIVE          Percentage of cells with nuclear positivity: 81-90%          Average intensity of staining: Strong   HER2 (by immunohistochemistry): POSITIVE (Score 3+)  Percentage of cells with uniform intense complete membrane  staining: 61-70%  HER2 displays a heterogeneous staining pattern, with areas showing a  strong 3+ staining pattern, and other regions with complete absence (0+)  of staining.   Ki-67: Not performed   CASE SUMMARY: BREAST BIOMARKER TESTS - B - RIGHT AXILLARY LYMPH NODE  Estrogen Receptor (ER) Status: POSITIVE          Percentage of cells with nuclear positivity: 81-90%          Average intensity of staining: Strong   Progesterone Receptor (PgR) Status: POSITIVE          Percentage of cells with nuclear positivity: 51-60%          Average intensity of staining: Strong   HER2 (by immunohistochemistry): NEGATIVE (Score 1+)  Ki-67: Not performed   # June 7th, 2023- TCH+P; skin biopsy pending; June 6th PET-pending    Carcinoma of upper-outer quadrant of right breast in female, estrogen receptor  positive ([place])  10/26/2021 Initial Diagnosis   Carcinoma of upper-outer quadrant of right breast in female, estrogen receptor positive ([place])   10/26/2021 Cancer Staging   Staging form: Breast, AJCC 8th Edition - Clinical: Stage IB (cT2, cN1, cM0, G2, ER+, PR+, HER2+) - Signed by Cammie Sickle, MD on 10/26/2021 Histologic grading system: 3 grade system   11/11/2021 -  Chemotherapy   Patient is on Treatment Plan : BREAST  Docetaxel + Carboplatin + Trastuzumab + Pertuzumab  (TCHP) q21d         RISK FACTORS:  Menarche was at age 32.  First live birth at age 61.  OCP use: yes Ovaries intact: yes.  Hysterectomy: no.  Menopausal status: premenopausal.  HRT use: 0 years. Colonoscopy: no; not examined. Mammogram within the last year: yes. Number of breast biopsies: 1.  Past Medical History:  Diagnosis Date   Carcinoma of upper-outer quadrant of right breast in female, estrogen receptor positive ([place]) 10/26/2021   Diabetes mellitus without complication ([place])    History of gestational diabetes     Past Surgical History:  Procedure Laterality Date   BREAST BIOPSY Right 10/19/2021   [place] Core bx 10:00 6 cmfn Ribbon Clip. Axilla Butterfly hydro clip- path pending   CESAREAN SECTION  2005/2008   DILATION AND CURETTAGE OF UTERUS  2003   PORTACATH PLACEMENT Left 11/09/2021   Procedure: INSERTION PORT-A-CATH;  Surgeon: Robert Bellow, MD;  Location: [facility] ORS;  Service: General;  Laterality: Left;   SKIN BIOPSY Right 11/09/2021   Procedure: PUNCH BIOPSY SKIN, TATTOO LYMPH NODE RIGHT AXILLA;  Surgeon: Robert Bellow, MD;  Location: [facility] ORS;  Service: General;  Laterality: Right;      FAMILY HISTORY:  We obtained a detailed, 4-generation family history.  Significant diagnoses are listed below: Family History  Problem Relation Age of Onset   Ovarian cysts Mother    Migraines Mother    Hyperlipidemia Father    Diabetes Maternal Aunt    Diabetes Maternal Grandfather    Ovarian  cancer Cousin 20       maternal 1st cousin-now in her 22s   Breast cancer Neg Hx    Marilyn Page has 2 daughters, 57 and 27. She has 1 brother, 41 and 1 niece.  Marilyn Page mother is living at 51 and recently had melanoma removed from her leg. A maternal cousin had lymphoma at 54. Maternal grandmother died in her late 64s, grandfather died at unknown age of heart issues.  Marilyn Page father  is living at 64. Patient had 1 aunt who had stomach cancer and passed from it. Paternal grandparents both passed in their 16s.  Marilyn Page is unaware of previous family history of genetic testing for hereditary cancer risks. There is no reported Ashkenazi Jewish ancestry. There is no known consanguinity.  GENETIC COUNSELING ASSESSMENT: Marilyn Page is a 46 y.o. female with a personal history of breast cancer which is somewhat suggestive of a hereditary cancer syndrome and predisposition to cancer. We, therefore, discussed and recommended the following at today's visit.   DISCUSSION: We discussed that approximately 10% of breast cancer is hereditary. Most cases of hereditary breast cancer are associated with BRCA1/BRCA2 genes, although there are other genes associated with hereditary cancer as well. Cancers and risks are gene specific. We discussed that testing is beneficial for several reasons including knowing about cancer risks, identifying potential screening and risk-reduction options that may be appropriate, and to understand if other family members could be at risk for cancer and allow them to undergo genetic testing.   We reviewed the characteristics, features and inheritance patterns of hereditary cancer syndromes. We also discussed genetic testing, including the appropriate family members to test, the process of testing, insurance coverage and turn-around-time for results. We discussed the implications of a negative, positive and/or variant of uncertain significant result. We recommended Marilyn Page pursue genetic  testing for the [facility] CancerNext-Expanded+RNA gene panel.   The CancerNext-Expanded + RNAinsight gene panel offered by [employer] and includes sequencing and rearrangement analysis for the following 77 genes: IP, ALK, APC*, ATM*, AXIN2, BAP1, BARD1, BLM, BMPR1A, BRCA1*, BRCA2*, BRIP1*, CDC73, CDH1*,CDK4, CDKN1B, CDKN2A, CHEK2*, CTNNA1, DICER1, FANCC, FH, FLCN, GALNT12, KIF1B, LZTR1, MAX, MEN1, MET, MLH1*, MSH2*, MSH3, MSH6*, MUTYH*, NBN, NF1*, NF2, NTHL1, PALB2*, PHOX2B, PMS2*, POT1, PRKAR1A, PTCH1, PTEN*, RAD51C*, RAD51D*,RB1, RECQL, RET, SDHA, SDHAF2, SDHB, SDHC, SDHD, SMAD4, SMARCA4, SMARCB1, SMARCE1, STK11, SUFU, TMEM127, TP53*,TSC1, TSC2, VHL and XRCC2 (sequencing and deletion/duplication); EGFR, EGLN1, HOXB13, KIT, MITF, PDGFRA, POLD1 and POLE (sequencing only); EPCAM and GREM1 (deletion/duplication only).  Based on Ms. Admire's personal history of cancer, she meets medical criteria for genetic testing. Despite that she meets criteria, she may still have an out of pocket cost. We discussed that if her out of pocket cost for testing is over $100, the laboratory will call and confirm whether she wants to proceed with testing.  If the out of pocket cost of testing is less than $100 she will be billed by the genetic testing laboratory.   PLAN: After considering the risks, benefits, and limitations, Marilyn Page provided informed consent to pursue genetic testing and the blood sample was sent to [facility] for analysis of the CancerNext-Expanded+RNA panel. Results should be available within approximately 2-3 weeks' time, at which point they will be disclosed by telephone to Marilyn Page, as will any additional recommendations warranted by these results. Marilyn Page will receive a summary of her genetic counseling visit and a copy of her results once available. This information will also be available in Epic.   Marilyn Page questions were answered to her satisfaction today. Our contact information was provided  should additional questions or concerns arise. Thank you for the referral and allowing [place] to share in the care of your patient.   Faith Rogue, MS, [facility] Genetic Counselor [email or web address] Phone: [phone]  The patient was seen for a total of 25 minutes in face-to-face genetic counseling.  Patient was seen with her friend, Marilyn Page. Dr. Grayland Ormond was available for discussion regarding this case.  _______________________________________________________________________ For Office Staff:  Number of people involved in session: 2 Was an Intern/ student involved with case: no

## 2021-11-19 ENCOUNTER — Encounter: Payer: Self-pay | Admitting: Internal Medicine

## 2021-11-19 ENCOUNTER — Encounter: Payer: Self-pay | Admitting: *Deleted

## 2021-11-19 MED ORDER — MAGIC MOUTHWASH W/LIDOCAINE: 5.0000 mL | Freq: Four times a day (QID) | ORAL | 1 refills | Status: DC | PRN

## 2021-11-19 NOTE — Progress Notes (Signed)
Patient sent a mychart message about sore mouth and a sore in her nose.  Returned call to her to give her Dr. Sharmaine Base recommendations.   I advised on baking soda and salt rinses 5-6 times per day, using a soft tooth brush and avoiding mouth wash with alcohol. Dr. B is also sending in a Rx for magic mouthwash to [facility].  She can use aquaphor or neosporin to sore spot in her nose.   She has also had some intermittent diarrhea but has taken imodium and today it has been better.   She has an appt. With Judson Roch, NP tomorrow and she felt she was ok waiting until tomorrow to be seen.   She knows to call back with any further needs.

## 2021-11-19 NOTE — Telephone Encounter (Addendum)
Paper rx for Magic Mouthwash faxed to [facility] at [phone]

## 2021-11-19 NOTE — Progress Notes (Signed)
Patient regarding her concerns mucositis from chemotherapy.  Recommended salt/baking soda rinses also Magic mouthwash.  Keep appointment with NP tomorrow.  Discussed/reviewed the results of the MRI breast-continue current chemotherapy.  To be Discussed further at next visit.

## 2021-11-20 ENCOUNTER — Other Ambulatory Visit: Payer: Self-pay

## 2021-11-20 ENCOUNTER — Encounter: Payer: Self-pay | Admitting: Medical Oncology

## 2021-11-20 ENCOUNTER — Inpatient Hospital Stay: Payer: 59

## 2021-11-20 ENCOUNTER — Inpatient Hospital Stay (HOSPITAL_BASED_OUTPATIENT_CLINIC_OR_DEPARTMENT_OTHER): Payer: 59 | Admitting: Medical Oncology

## 2021-11-20 VITALS — BP 144/44 | HR 94 | Temp 98.3°F | Resp 17 | Wt 217.0 lb

## 2021-11-20 DIAGNOSIS — C50411 Malignant neoplasm of upper-outer quadrant of right female breast: Secondary | ICD-10-CM | POA: Diagnosis not present

## 2021-11-20 DIAGNOSIS — E860 Dehydration: Secondary | ICD-10-CM

## 2021-11-20 DIAGNOSIS — Z170 Estrogen receptor positive status [ER+]: Secondary | ICD-10-CM

## 2021-11-20 DIAGNOSIS — R040 Epistaxis: Secondary | ICD-10-CM

## 2021-11-20 DIAGNOSIS — R634 Abnormal weight loss: Secondary | ICD-10-CM

## 2021-11-20 DIAGNOSIS — Z5112 Encounter for antineoplastic immunotherapy: Secondary | ICD-10-CM | POA: Diagnosis not present

## 2021-11-20 LAB — CBC WITH DIFFERENTIAL/PLATELET
Abs Immature Granulocytes: 0.5 10*3/uL — ABNORMAL HIGH (ref 0.00–0.07)
Basophils Absolute: 0 10*3/uL (ref 0.0–0.1)
Basophils Relative: 0 %
Eosinophils Absolute: 1.1 10*3/uL — ABNORMAL HIGH (ref 0.0–0.5)
Eosinophils Relative: 4 %
HCT: 41 % (ref 36.0–46.0)
Hemoglobin: 13.6 g/dL (ref 12.0–15.0)
Lymphocytes Relative: 19 %
Lymphs Abs: 5 10*3/uL — ABNORMAL HIGH (ref 0.7–4.0)
MCH: 28.3 pg (ref 26.0–34.0)
MCHC: 33.2 g/dL (ref 30.0–36.0)
MCV: 85.4 fL (ref 80.0–100.0)
Metamyelocytes Relative: 1 %
Monocytes Absolute: 1.1 10*3/uL — ABNORMAL HIGH (ref 0.1–1.0)
Monocytes Relative: 4 %
Myelocytes: 1 %
Neutro Abs: 18.8 10*3/uL — ABNORMAL HIGH (ref 1.7–7.7)
Neutrophils Relative %: 71 %
Platelets: 288 10*3/uL (ref 150–400)
RBC: 4.8 MIL/uL (ref 3.87–5.11)
RDW: 12.7 % (ref 11.5–15.5)
Smear Review: NORMAL
WBC: 26.5 10*3/uL — ABNORMAL HIGH (ref 4.0–10.5)
nRBC: 0.1 % (ref 0.0–0.2)

## 2021-11-20 LAB — BASIC METABOLIC PANEL
Anion gap: 9 (ref 5–15)
BUN: 10 mg/dL (ref 6–20)
CO2: 29 mmol/L (ref 22–32)
Calcium: 8.7 mg/dL — ABNORMAL LOW (ref 8.9–10.3)
Chloride: 99 mmol/L (ref 98–111)
Creatinine, Ser: 1.09 mg/dL — ABNORMAL HIGH (ref 0.44–1.00)
GFR, Estimated: >60 mL/min (ref >60)
Glucose, Bld: 185 mg/dL — ABNORMAL HIGH (ref 70–99)
Potassium: 3.5 mmol/L (ref 3.5–5.1)
Sodium: 137 mmol/L (ref 135–145)

## 2021-11-20 MED ORDER — HEPARIN SOD (PORK) LOCK FLUSH 100 UNIT/ML IV SOLN
500.0000 [IU] | Freq: Once | INTRAVENOUS | Status: AC
  Administered 2021-11-20: 500 [IU] via INTRAVENOUS
  Filled 2021-11-20: qty 5

## 2021-11-20 MED ORDER — SODIUM CHLORIDE 0.9 % IV SOLN
Freq: Once | INTRAVENOUS | Status: AC
  Filled 2021-11-20: qty 250

## 2021-11-20 MED ORDER — SODIUM CHLORIDE 0.9% FLUSH
10.0000 mL | Freq: Once | INTRAVENOUS | Status: AC
  Administered 2021-11-20: 10 mL via INTRAVENOUS
  Filled 2021-11-20: qty 10

## 2021-11-20 NOTE — Progress Notes (Signed)
Patient here for oncology follow-up appointment, concerns of loss of taste down 4 lbs, nausea

## 2021-11-20 NOTE — Patient Instructions (Signed)
Rehydration, Adult Rehydration is the replacement of body fluids, salts, and minerals (electrolytes) that are lost during dehydration. Dehydration is when there is not enough water or other fluids in the body. This happens when you lose more fluids than you take in. Common causes of dehydration include: Not drinking enough fluids. This can occur when you are ill or doing activities that require a lot of energy, especially in hot weather. Conditions that cause loss of water or other fluids, such as diarrhea, vomiting, sweating, or urinating a lot. Other illnesses, such as fever or infection. Certain medicines, such as those that remove excess fluid from the body (diuretics). Symptoms of mild or moderate dehydration may include thirst, dry lips and mouth, and dizziness. Symptoms of severe dehydration may include increased heart rate, confusion, fainting, and not urinating. For severe dehydration, you may need to get fluids through an IV at the hospital. For mild or moderate dehydration, you can usually rehydrate at home by drinking certain fluids as told by your health care provider. What are the risks? Generally, rehydration is safe. However, taking in too much fluid (overhydration) can be a problem. This is rare. Overhydration can cause an electrolyte imbalance, kidney failure, or a decrease in salt (sodium) levels in the body. Supplies needed You will need an oral rehydration solution (ORS) if your health care provider tells you to use one. This is a drink to treat dehydration. It can be found in pharmacies and retail stores. How to rehydrate Fluids Follow instructions from your health care provider for rehydration. The kind of fluid and the amount you should drink depend on your condition. In general, you should choose drinks that you prefer. If told by your health care provider, drink an ORS. Make an ORS by following instructions on the package. Start by drinking small amounts, about  cup (120  mL) every 5-10 minutes. Slowly increase how much you drink until you have taken the amount recommended by your health care provider. Drink enough clear fluids to keep your urine pale yellow. If you were told to drink an ORS, finish it first, then start slowly drinking other clear fluids. Drink fluids such as: Water. This includes sparkling water and flavored water. Drinking only water can lead to having too little sodium in your body (hyponatremia). Follow the advice of your health care provider. Water from ice chips you suck on. Fruit juice with water you add to it (diluted). Sports drinks. Hot or cold herbal teas. Broth-based soups. Milk or milk products. Food Follow instructions from your health care provider about what to eat while you rehydrate. Your health care provider may recommend that you slowly begin eating regular foods in small amounts. Eat foods that contain a healthy balance of electrolytes, such as bananas, oranges, potatoes, tomatoes, and spinach. Avoid foods that are greasy or contain a lot of sugar. In some cases, you may get nutrition through a feeding tube that is passed through your nose and into your stomach (nasogastric tube, or NG tube). This may be done if you have uncontrolled vomiting or diarrhea. Beverages to avoid  Certain beverages may make dehydration worse. While you rehydrate, avoid drinking alcohol. How to tell if you are recovering from dehydration You may be recovering from dehydration if: You are urinating more often than before you started rehydrating. Your urine is pale yellow. Your energy level improves. You vomit less frequently. You have diarrhea less frequently. Your appetite improves or returns to normal. You feel less dizzy or less light-headed.   Your skin tone and color start to look more normal. Follow these instructions at home: Take over-the-counter and prescription medicines only as told by your health care provider. Do not take sodium  tablets. Doing this can lead to having too much sodium in your body (hypernatremia). Contact a health care provider if: You continue to have symptoms of mild or moderate dehydration, such as: Thirst. Dry lips. Slightly dry mouth. Dizziness. Dark urine or less urine than normal. Muscle cramps. You continue to vomit or have diarrhea. Get help right away if you: Have symptoms of dehydration that get worse. Have a fever. Have a severe headache. Have been vomiting and the following happens: Your vomiting gets worse or does not go away. Your vomit includes blood or green matter (bile). You cannot eat or drink without vomiting. Have problems with urination or bowel movements, such as: Diarrhea that gets worse or does not go away. Blood in your stool (feces). This may cause stool to look black and tarry. Not urinating, or urinating only a small amount of very dark urine, within 6-8 hours. Have trouble breathing. Have symptoms that get worse with treatment. These symptoms may represent a serious problem that is an emergency. Do not wait to see if the symptoms will go away. Get medical help right away. Call your local emergency services (911 in the [place] Do not drive yourself to the hospital. Summary Rehydration is the replacement of body fluids and minerals (electrolytes) that are lost during dehydration. Follow instructions from your health care provider for rehydration. The kind of fluid and amount you should drink depend on your condition. Slowly increase how much you drink until you have taken the amount recommended by your health care provider. Contact your health care provider if you continue to show signs of mild or moderate dehydration. This information is not intended to replace advice given to you by your health care provider. Make sure you discuss any questions you have with your health care provider. Document Revised: 07/25/2019 Document Reviewed: 06/04/2019 Elsevier Patient  Education  2023 [employer]  

## 2021-11-20 NOTE — Progress Notes (Signed)
Symptom Management [place] at [facility] [phone] [phone]  Patient Care Team: Latanya Maudlin, NP as PCP - General (Family Medicine) Daiva Huge, RN as Oncology Nurse Navigator Cammie Sickle, [name]   Name of the patient: Marilyn Page  [id number]  23-Aug-1975   Date of visit: 11/20/21  Reason for Consult: Marilyn Page is a 46 y.o. 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   Patient last saw Dr. Rogue Bussing on 11/10/2021 and received cycle 1 neoadjuvant TCHP chemotherapy on 11/11/2021.  She received Udenyca on 11/13/2021.  She presents today as a follow up from her first chemotherapy. She reports some nausea which has resolved as well as nosebleeds that have resolved (thought to be secondary to the claritin which she has now stopped). Mild intermittent diarrhea.Otherwise tolerated well. Down 4 pounds since last visit due to food not tasting well and decreased oral intake. Drinking about 26 ounces per day of fluids. No fevers.   Denies any neurologic complaints. Denies recent fevers or illnesses. Denies any easy bleeding or bruising.  Denies chest pain. Denies any nausea, vomiting, constipation, or diarrhea today. Denies urinary complaints. Patient offers no further specific complaints today.  PAST MEDICAL HISTORY: Past Medical History:  Diagnosis Date   Carcinoma of upper-outer quadrant of right breast in female, estrogen receptor positive ([place]) 10/26/2021   Diabetes mellitus without complication ([place])    History of gestational diabetes     PAST SURGICAL HISTORY:  Past Surgical History:  Procedure Laterality Date   BREAST BIOPSY Right 10/19/2021   [place] Core bx 10:00 6 cmfn Ribbon Clip. Axilla Butterfly hydro clip- path pending   CESAREAN SECTION  2005/2008   DILATION AND CURETTAGE OF UTERUS  2003   PORTACATH PLACEMENT Left 11/09/2021   Procedure:  INSERTION PORT-A-CATH;  Surgeon: Robert Bellow, MD;  Location: [facility] ORS;  Service: General;  Laterality: Left;   SKIN BIOPSY Right 11/09/2021   Procedure: PUNCH BIOPSY SKIN, TATTOO LYMPH NODE RIGHT AXILLA;  Surgeon: Robert Bellow, MD;  Location: [facility] ORS;  Service: General;  Laterality: Right;    HEMATOLOGY/ONCOLOGY HISTORY:  Oncology History Overview Note  MAY 2023-    Targeted ultrasound is performed, showing a 2.9 x 1.9 x 2.0 cm irregular hypoechoic mass right breast 10 o'clock position 6 cm from nipple at the site of palpable concern.   There is an abnormal 8 mm thickened right axillary lymph node.   There is skin thickening overlying the right breast. ------------------  A. BREAST, RIGHT AT 10:00, 6 CM FROM THE NIPPLE; ULTRASOUND-GUIDED CORE  NEEDLE BIOPSY:  - INVASIVE MAMMARY CARCINOMA, NO SPECIAL TYPE.   Size of invasive carcinoma: 11 mm in this sample  Histologic grade of invasive carcinoma: Grade 2                       Glandular/tubular differentiation score: 3                       Nuclear pleomorphism score: 2                       Mitotic rate score: 1                       Total score: 6  Ductal carcinoma in situ: Present, intermediate grade  Lymphovascular invasion: Not  identified   B. LYMPH NODE, RIGHT AXILLARY; ULTRASOUND-GUIDED CORE NEEDLE BIOPSY:  - 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 IN GREATEST  EXTENT.   Comment:  The malignancy in the primary breast lesion appears morphologically  different from tumor in the lymph node sampling, with tumor present in  lymph node more suggestive of a histologic grade 3, with significantly  increased mitotic activity and pleomorphism. Due to the discrepancy in  these morphologic patterns, ER/PR/HER2 immunohistochemistry will be  performed on both blocks A1 and B1, with reflex to [place] for HER2 2+. The  results will be reported in an addendum.   ADDENDUM:  CASE SUMMARY: BREAST BIOMARKER TESTS  - A - BREAST, RIGHT AT 10:00  Estrogen Receptor (ER) Status: POSITIVE          Percentage of cells with nuclear positivity: 71-80%          Average intensity of staining: Strong   Progesterone Receptor (PgR) Status: POSITIVE          Percentage of cells with nuclear positivity: 81-90%          Average intensity of staining: Strong   HER2 (by immunohistochemistry): POSITIVE (Score 3+)       Percentage of cells with uniform intense complete membrane  staining: 61-70%  HER2 displays a heterogeneous staining pattern, with areas showing a  strong 3+ staining pattern, and other regions with complete absence (0+)  of staining.   Ki-67: Not performed   CASE SUMMARY: BREAST BIOMARKER TESTS - B - RIGHT AXILLARY LYMPH NODE  Estrogen Receptor (ER) Status: POSITIVE          Percentage of cells with nuclear positivity: 81-90%          Average intensity of staining: Strong   Progesterone Receptor (PgR) Status: POSITIVE          Percentage of cells with nuclear positivity: 51-60%          Average intensity of staining: Strong   HER2 (by immunohistochemistry): NEGATIVE (Score 1+)  Ki-67: Not performed   # June 7th, 2023- TCH+P; skin biopsy pending; June 6th PET-pending    Carcinoma of upper-outer quadrant of right breast in female, estrogen receptor positive ([place])  10/26/2021 Initial Diagnosis   Carcinoma of upper-outer quadrant of right breast in female, estrogen receptor positive ([place])   10/26/2021 Cancer Staging   Staging form: Breast, AJCC 8th Edition - Clinical: Stage IB (cT2, cN1, cM0, G2, ER+, PR+, HER2+) - Signed by Cammie Sickle, MD on 10/26/2021 Histologic grading system: 3 grade system   11/11/2021 -  Chemotherapy   Patient is on Treatment Plan : BREAST  Docetaxel + Carboplatin + Trastuzumab + Pertuzumab  (TCHP) q21d        ALLERGIES:  is allergic to metformin.  MEDICATIONS:  Current Outpatient Medications  Medication Sig Dispense Refill   Alcohol Swabs (ALCOHOL WIPES)  70 % PADS Apply topically.     calcium-vitamin D (OSCAL WITH D) 500-5 MG-MCG tablet Take 1 tablet by mouth 2 (two) times daily. 60 tablet 2   Cholecalciferol 50 MCG (2000 UT) CAPS Take by mouth.     dexamethasone (DECADRON) 4 MG tablet One pill twice a day - Start the day prior to chemo; do not take the day of chemo. 40 tablet 0   ferrous sulfate 325 (65 FE) MG tablet Take 325 mg by mouth 2 (two) times daily with a meal.     lidocaine-prilocaine (EMLA) cream Apply on the port.  30 -45 min  prior to port access. 30 g 3   magic mouthwash w/lidocaine SOLN Take 5 mLs by mouth 4 (four) times daily as needed for mouth pain. Sig: Swish/Swallow 5-10 ml four times a day as needed. Dispense 480 ml. 1RF 480 mL 1   norethindrone (MICRONOR) 0.35 MG tablet Take 1 tablet (0.35 mg total) by mouth daily. 84 tablet 2   ondansetron (ZOFRAN-ODT) 8 MG disintegrating tablet Take 1 tablet (8 mg total) by mouth every 8 (eight) hours as needed for nausea or vomiting. 45 tablet 1   ONETOUCH VERIO test strip SMARTSIG:1 Each Via Meter Daily PRN     OZEMPIC, 0.25 OR 0.5 MG/DOSE, 2 MG/1.5ML SOPN PLEASE SEE ATTACHED FOR DETAILED DIRECTIONS     prochlorperazine (COMPAZINE) 10 MG tablet Take 1 tablet (10 mg total) by mouth every 6 (six) hours as needed for nausea or vomiting. 40 tablet 1   No current facility-administered medications for this visit.   Facility-Administered Medications Ordered in Other Visits  Medication Dose Route Frequency Provider Last Rate Last Admin   heparin lock flush 100 unit/mL  500 Units Intravenous Once Tareva Leske M, PA-C       sodium chloride flush (NS) 0.9 % injection 10 mL  10 mL Intravenous Once Christiane Sistare M, [place]        VITAL SIGNS: BP (!) 144/44 (Patient Position: Sitting)   Pulse 94   Temp 98.3 F (36.8 C) (Tympanic)   Resp 17   Wt 217 lb (98.4 kg)   LMP 11/06/2021 (Exact Date)   SpO2 98%   BMI 36.11 kg/m  Filed Weights   11/20/21 1359  Weight: 217 lb (98.4 kg)     Estimated body mass index is 36.11 kg/m as calculated from the following:   Height as of 11/16/21: 5' 5"  (1.651 m).   Weight as of this encounter: 217 lb (98.4 kg).  LABS: CBC:    Component Value Date/Time   WBC 26.5 (H) 11/20/2021 1333   HGB 13.6 11/20/2021 1333   HGB 7.8 (L) 04/23/2021 1431   HCT 41.0 11/20/2021 1333   HCT 25.5 (L) 04/23/2021 1431   PLT 288 11/20/2021 1333   PLT 384 04/23/2021 1431   MCV 85.4 11/20/2021 1333   MCV 84 04/23/2021 1431   NEUTROABS PENDING 11/20/2021 1333   NEUTROABS 5.4 04/23/2021 1431   LYMPHSABS PENDING 11/20/2021 1333   LYMPHSABS 2.2 04/23/2021 1431   MONOABS PENDING 11/20/2021 1333   EOSABS PENDING 11/20/2021 1333   EOSABS 0.2 04/23/2021 1431   BASOSABS PENDING 11/20/2021 1333   BASOSABS 0.1 04/23/2021 1431   Comprehensive Metabolic Panel:    Component Value Date/Time   NA 137 11/20/2021 1333   NA 143 09/26/2017 1512   K 3.5 11/20/2021 1333   CL 99 11/20/2021 1333   CO2 29 11/20/2021 1333   BUN 10 11/20/2021 1333   BUN 9 09/26/2017 1512   CREATININE 1.09 (H) 11/20/2021 1333   GLUCOSE 185 (H) 11/20/2021 1333   CALCIUM 8.7 (L) 11/20/2021 1333   AST 36 11/16/2021 1334   ALT 56 (H) 11/16/2021 1334   ALKPHOS 58 11/16/2021 1334   BILITOT 0.4 11/16/2021 1334   BILITOT <0.2 09/26/2017 1512   PROT 7.0 11/16/2021 1334   PROT 6.8 09/26/2017 1512   ALBUMIN 3.5 11/16/2021 1334   ALBUMIN 3.9 09/26/2017 1512    RADIOGRAPHIC STUDIES: MR BREAST BILATERAL W WO CONTRAST INC CAD  Result Date: 11/17/2021 CLINICAL DATA:  Newly diagnosed right breast cancer.  EXAM: BILATERAL BREAST MRI WITH AND WITHOUT CONTRAST TECHNIQUE: Multiplanar, multisequence MR images of both breasts were obtained prior to and following the intravenous administration of 10 ml of Gadavist Three-dimensional MR images were rendered by post-processing of the original MR data on an independent workstation. The three-dimensional MR images were interpreted, and findings are  reported in the following complete MRI report for this study. Three dimensional images were evaluated at the independent interpreting workstation using the DynaCAD thin client. COMPARISON:  Mammography and ultrasound dated October 01, 2021 FINDINGS: Breast composition: d. Extreme fibroglandular tissue. Background parenchymal enhancement: Moderate. Right breast: The patient's known malignancy is located in the upper outer quadrant of the right breast measuring 3.8 by 2.8 by 3.6 cm in AP, transverse, and craniocaudal dimensions. Numerous other suspicious masses are identified in the right breast. A cluster of 3 linearly arrayed masses are identified in the upper outer right breast on series 6, images 38-41. These masses are located anterior and inferior to the known biopsy proven malignancy. The largest of these 3 masses is best seen on series 6, image 40 measuring 6 mm in size with a mixture of persistent, plateau, and washout kinetics. The largest of these masses is located approximately 2 cm anterior and inferior to the known malignancy. The smallest and most anterior these masses is located 2.5 cm anterior and inferior to the known malignancy. There are multiple a apparent satellite lesions medial and posterior to the known malignancy as identified on series 6, image 52. There is an additional satellite lesion just posterior to the known malignancy on series 6, image 60 measuring 4 mm. There are 2 adjacent masses anterior and medial to the known malignancy as identified on series 6, images 55 and 59. The largest of these 2 masses on image 59 measures 8 mm. These 2 masses demonstrate primarily persistent kinetics with a tiny amount of plateau kinetics. An additional mass is identified superior, anterior, and medial to the known malignancy as identified on series 6, image 67 with persistent kinetics. This mass measures 5 mm. The distance between the anterior-most and posterior most suspected satellite lesions is 7.5  cm. The transverse dimension between the lateral-most and medial most suspected satellite lesions is 5.3 cm. The distance between the superior most and inferior most satellite lesions is 4.3 cm. Multiple foci of enhancement are identified in the skin. Two adjacent foci are identified on series 6, image 76 in the upper breast. Other potential skin lesions are identified on series 6, images 73, 70, 60, and 50. There is diffuse skin thickening. Left breast: No mass or abnormal enhancement. Lymph nodes: The biopsied lymph node in the right axilla is identified with cortical thickening and adjacent HydroMARK clip. There is a lymph node just superior and deep to the biopsied node, also abnormal in appearance as identified on series 6, image 74. No other definitive abnormal nodes identified in the right axilla. No abnormal left axillary nodes. There are 2 mildly prominent right internal mammary nodes as seen on series 6, images 66 and 81. These 2 nodes are not FDG avid on the recent PET-CT. Ancillary findings:  None. IMPRESSION: 1. The patient's known malignancy in the upper outer quadrant of the right breast measures 3.8 x 2.8 x 3.6 cm. Numerous other suspicious masses are identified in the right breast as above involving the upper outer and upper inner quadrants, located both anterior and posterior to the known malignancy. The distance between the most anterior and posterior suspected satellite lesions  at 7.5 cm. The distance between the most lateral and most medial suspected satellite lesions is 5.3 cm. The distance between the most superior and inferior satellite lesions is 4.3 cm. 2. Numerous enhancing foci in the skin as above are worrisome for skin metastases. 3. Biopsy proven metastatic node in the right axilla. An adjacent node is also abnormal in appearance as above. 4. No evidence of malignancy in the left breast. 5. Two mildly prominent right internal mammary nodes were not FDG avid on recent PET-CT imaging.  Recommend attention on follow-up. RECOMMENDATION: Recommend continued surgical and oncologic follow up. The most superomedial mass in the right breast on series 6, image 67, the most inferior mass in the upper outer right breast on series 6, image 39, and the most posteriorly located masses in the right breast on series 6, image 52 could be biopsied under MRI guidance if it would change clinical management. Additional biopsies may be helpful if breast conservation is being considered. Recommend clinical assessment of the patient's thickened skin given the suspicion for skin metastases. BI-RADS CATEGORY  4: Suspicious. Electronically Signed   By: Dorise Bullion III M.D.   On: 11/17/2021 12:13  NM PET Image Initial (PI) Skull Base To Thigh (F-18 FDG)  Result Date: 11/11/2021 CLINICAL DATA:  Initial treatment strategy for new diagnosis of right breast cancer. EXAM: NUCLEAR MEDICINE PET SKULL BASE TO THIGH TECHNIQUE: 12.33 mCi F-18 FDG was injected intravenously. Full-ring PET imaging was performed from the skull base to thigh after the radiotracer. CT data was obtained and used for attenuation correction and anatomic localization. Fasting blood glucose: 89 mg/dl COMPARISON:  Recent mammograms and breast ultrasound. FINDINGS: Mediastinal blood pool activity: SUV max 1.88 Liver activity: SUV max NA NECK: No hypermetabolic lymph nodes in the neck. Incidental CT findings: none CHEST: Chest 3 cm right lateral breast mass is hypermetabolic with SUV max of 3.73 and consistent with known breast cancer. Adjacent fluid collection is likely postop/post biopsy liquified hematoma. A small biopsy clip is noted. Small scattered right axillary lymph nodes demonstrate mild hypermetabolism with SUV max of 5.08. These are suspicious for metastatic disease. No enlarged or hypermetabolic mediastinal or hilar lymph nodes. No worrisome pulmonary nodules to suggest pulmonary metastatic disease. No left-sided breast mass. Incidental CT  findings: The left-sided Port-A-Cath is in good position without complicating features. ABDOMEN/PELVIS: No abnormal hypermetabolic activity within the liver, pancreas, adrenal glands, or spleen. No hypermetabolic lymph nodes in the abdomen or pelvis. Incidental CT findings: none SKELETON: No focal hypermetabolic activity to suggest skeletal metastasis. Incidental CT findings: none IMPRESSION: 1. Hypermetabolic right lateral breast mass consistent with known breast cancer. 2. Small but hypermetabolic right axillary nodes suggesting metastatic adenopathy. 3. No findings for metastatic disease involving the chest, abdomen/pelvis or bony structures. Electronically Signed   By: Marijo Sanes M.D.   On: 11/11/2021 15:52   DG Chest Port 1 View  Result Date: 11/09/2021 CLINICAL DATA:  Port-A-Cath insertion. EXAM: PORTABLE CHEST 1 VIEW COMPARISON:  None Available. FINDINGS: Heart size is normal. A left subclavian Port-A-Cath is in place. The tip of the catheter is in the right atrium, proximally 4 cm beyond the cavoatrial junction. Lung volumes are low. Mild atelectasis is present at the lung bases. No pneumothorax is present. IMPRESSION: 1. Tip of left subclavian Port-A-Cath is in the right atrium, approximately 4 cm beyond the cavoatrial junction. 2. Low lung volumes and mild bibasilar atelectasis. These results will be called to the ordering clinician or representative by the  Psychologist, clinical, and communication documented in the PACS or [employer]. Electronically Signed   By: San Morelle M.D.   On: 11/09/2021 10:06   DG C-Arm 1-60 Min-No Report  Result Date: 11/09/2021 Fluoroscopy was utilized by the requesting physician.  No radiographic interpretation.   NM Cardiac Muga Rest  Result Date: 11/04/2021 CLINICAL DATA:  Breast cancer.  Pre chemotherapy. EXAM: NUCLEAR MEDICINE CARDIAC BLOOD POOL IMAGING (MUGA) TECHNIQUE: Cardiac multi-gated acquisition was performed at rest following intravenous  injection of Tc-25mlabeled red blood cells. RADIOPHARMACEUTICALS:  21.24 mCi Tc-950mertechnetate in-vitro labeled red blood cells IV COMPARISON:  None Available. FINDINGS: The seen a images demonstrate normal left ventricular wall motion. No akinetic or dyskinetic segments are identified. The calculated ejection fraction is 62.1. IMPRESSION: Ejection fraction is 62.1 Electronically Signed   By: P.Marijo Sanes.D.   On: 11/04/2021 16:02    PERFORMANCE STATUS (ECOG) : 0 - Asymptomatic  Review of Systems Unless otherwise noted, a complete review of systems is negative.  Physical Exam General: NAD Cardiovascular: regular rate and rhythm Pulmonary: clear anterior/posterior fields Abdomen: soft, nontender, + bowel sounds GU: no suprapubic tenderness Extremities: no edema, no joint deformities Skin: no rashes Neurological: Nonfocal  Assessment and Plan- Patient is a 89.o. 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  Patient received first cycle of TCHP chemotherapy last week followed [facility]Ellen Henri [facility] fatigue and epistaxis.  Epistaxis - though to be secondary to her antihistamine- resolved. Can try saline nasal spray or gentle scant coating of aquaphor on qtip in nasal passageways.   Weight Loss- secondary to lower oral intake. Discussed ways to try foods that may help with the taste and encouraged Boost drinks.   Elevated creatinine- likely secondary to low fluid intake. We discussed the importance of fluid intake and that goal for next visit will be about 36-64 ounces of fluid daily with at least 12 of those being electrolyte drinks. 1 L IVF today. Follow up in 1 week to recheck labs and do additional fluids if needed.    Patient expressed understanding and was in agreement with this plan. She also understands that She can call clinic at any time with any questions, concerns, or complaints.   Thank you for allowing me to  participate in the care of this very pleasant patient.   Time Total: 25 minutes  Visit consisted of counseling and education dealing with the complex and emotionally intense issues of symptom management in the setting of serious illness.Greater than 50%  of this time was spent counseling and coordinating care related to the above assessment and plan.  Signed by: SaNelwyn SalisburyA-C

## 2021-11-24 ENCOUNTER — Inpatient Hospital Stay: Payer: 59

## 2021-11-24 ENCOUNTER — Telehealth: Payer: Self-pay | Admitting: Internal Medicine

## 2021-11-24 ENCOUNTER — Other Ambulatory Visit: Payer: Self-pay

## 2021-11-24 ENCOUNTER — Other Ambulatory Visit: Payer: Self-pay | Admitting: Medical Oncology

## 2021-11-24 ENCOUNTER — Encounter: Payer: Self-pay | Admitting: Internal Medicine

## 2021-11-24 DIAGNOSIS — D72829 Elevated white blood cell count, unspecified: Secondary | ICD-10-CM

## 2021-11-24 DIAGNOSIS — Z5112 Encounter for antineoplastic immunotherapy: Secondary | ICD-10-CM | POA: Diagnosis not present

## 2021-11-24 DIAGNOSIS — R5383 Other fatigue: Secondary | ICD-10-CM

## 2021-11-24 LAB — URINALYSIS, COMPLETE (UACMP) WITH MICROSCOPIC
Bilirubin Urine: NEGATIVE
Glucose, UA: NEGATIVE mg/dL
Ketones, ur: NEGATIVE mg/dL
Nitrite: NEGATIVE
Protein, ur: NEGATIVE mg/dL
Specific Gravity, Urine: 1.014 (ref 1.005–1.030)
WBC, UA: >50 WBC/hpf — ABNORMAL HIGH (ref 0–5)
pH: 8 (ref 5.0–8.0)

## 2021-11-24 MED ORDER — CIPROFLOXACIN HCL 500 MG PO TABS: 500.0000 mg | ORAL_TABLET | Freq: Two times a day (BID) | ORAL | 0 refills | Status: DC

## 2021-11-24 NOTE — Telephone Encounter (Signed)
UA concerning for infection.  Called in a prescription for ciprofloxacin for 7 days.  Keep appointment with nurse practitioner as planned tomorrow.   FYI- GB

## 2021-11-24 NOTE — Telephone Encounter (Signed)
Pt.notified

## 2021-11-24 NOTE — Telephone Encounter (Signed)
Per Dr. Jacinto Reap, pt has uti, lab was reviewed, cipro called to pharm, pt notified and to keep appt with PA tomorrow.

## 2021-11-25 ENCOUNTER — Inpatient Hospital Stay (HOSPITAL_BASED_OUTPATIENT_CLINIC_OR_DEPARTMENT_OTHER): Payer: 59 | Admitting: Medical Oncology

## 2021-11-25 ENCOUNTER — Inpatient Hospital Stay: Payer: 59

## 2021-11-25 ENCOUNTER — Other Ambulatory Visit: Payer: Self-pay

## 2021-11-25 ENCOUNTER — Encounter: Payer: Self-pay | Admitting: Medical Oncology

## 2021-11-25 VITALS — BP 126/84 | HR 84 | Temp 97.4°F | Resp 17 | Wt 221.3 lb

## 2021-11-25 DIAGNOSIS — C50411 Malignant neoplasm of upper-outer quadrant of right female breast: Secondary | ICD-10-CM

## 2021-11-25 DIAGNOSIS — Z5112 Encounter for antineoplastic immunotherapy: Secondary | ICD-10-CM | POA: Diagnosis not present

## 2021-11-25 DIAGNOSIS — Z170 Estrogen receptor positive status [ER+]: Secondary | ICD-10-CM

## 2021-11-25 DIAGNOSIS — R634 Abnormal weight loss: Secondary | ICD-10-CM | POA: Diagnosis not present

## 2021-11-25 DIAGNOSIS — E860 Dehydration: Secondary | ICD-10-CM

## 2021-11-25 LAB — CBC
HCT: 40.2 % (ref 36.0–46.0)
Hemoglobin: 13.2 g/dL (ref 12.0–15.0)
MCH: 28.1 pg (ref 26.0–34.0)
MCHC: 32.8 g/dL (ref 30.0–36.0)
MCV: 85.5 fL (ref 80.0–100.0)
Platelets: 130 10*3/uL — ABNORMAL LOW (ref 150–400)
RBC: 4.7 MIL/uL (ref 3.87–5.11)
RDW: 12.7 % (ref 11.5–15.5)
WBC: 10.2 10*3/uL (ref 4.0–10.5)
nRBC: 0 % (ref 0.0–0.2)

## 2021-11-25 LAB — BASIC METABOLIC PANEL
Anion gap: 7 (ref 5–15)
BUN: 9 mg/dL (ref 6–20)
CO2: 29 mmol/L (ref 22–32)
Calcium: 8.7 mg/dL — ABNORMAL LOW (ref 8.9–10.3)
Chloride: 100 mmol/L (ref 98–111)
Creatinine, Ser: 0.85 mg/dL (ref 0.44–1.00)
GFR, Estimated: >60 mL/min (ref >60)
Glucose, Bld: 132 mg/dL — ABNORMAL HIGH (ref 70–99)
Potassium: 3.6 mmol/L (ref 3.5–5.1)
Sodium: 136 mmol/L (ref 135–145)

## 2021-11-25 MED ORDER — NITROFURANTOIN MONOHYD MACRO 100 MG PO CAPS: 100.0000 mg | ORAL_CAPSULE | Freq: Two times a day (BID) | ORAL | 0 refills | Status: DC

## 2021-11-25 NOTE — Progress Notes (Signed)
[facility]pt[facility] [phone] [phone]) [phone]  Patient Care Team: Latanya Maudlin, NP as PCP - General (Family Medicine) Daiva Huge, RN as Oncology Nurse Navigator Cammie Sickle, [name]   Name of the patient: Marilyn Page  [id number]  [birth year]   Date of visit: 11/25/21  Reason for Consult: Marilyn Page is a 46 y.o. 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   Patient last saw Dr. Rogue Bussing on 11/10/2021 and received cycle 1 neoadjuvant TCHP chemotherapy on 11/11/2021.  She received Udenyca on 11/13/2021.  Patient follows up today on dehydration and some weight loss from her first round of chemotherapy.  At her last visit she was drinking about 26 ounces of water per day.  She has increased her fluid intake to be about 40 ounces of fluid per day.  She is trying to eat more.  As well but watch her carbohydrates given her diabetes.  She reports that overall she is feeling better and food is tasting a little bit more normal.  She has not lost any weight and is actually up a few pounds from last visit.  No diarrhea, nausea or fevers.  Denies any neurologic complaints. Denies recent fevers or illnesses. Denies any easy bleeding or bruising.  Denies chest pain. Denies any nausea, vomiting, constipation, or diarrhea today. Denies urinary complaints. Patient offers no further specific complaints today.  PAST MEDICAL HISTORY: Past Medical History:  Diagnosis Date   Carcinoma of upper-outer quadrant of right breast in female, estrogen receptor positive ([place]) 10/26/2021   Diabetes mellitus without complication ([place])    History of gestational diabetes     PAST SURGICAL HISTORY:  Past Surgical History:  Procedure Laterality Date   BREAST BIOPSY Right 10/19/2021   [place] Core bx 10:00 6 cmfn Ribbon Clip. Axilla Butterfly hydro clip- path pending    CESAREAN SECTION  2005/2008   DILATION AND CURETTAGE OF UTERUS  2003   PORTACATH PLACEMENT Left 11/09/2021   Procedure: INSERTION PORT-A-CATH;  Surgeon: Robert Bellow, MD;  Location: [facility] ORS;  Service: General;  Laterality: Left;   SKIN BIOPSY Right 11/09/2021   Procedure: PUNCH BIOPSY SKIN, TATTOO LYMPH NODE RIGHT AXILLA;  Surgeon: Robert Bellow, MD;  Location: [facility] ORS;  Service: General;  Laterality: Right;    HEMATOLOGY/ONCOLOGY HISTORY:  Oncology History Overview Note  MAY 2023-    Targeted ultrasound is performed, showing a 2.9 x 1.9 x 2.0 cm irregular hypoechoic mass right breast 10 o'clock position 6 cm from nipple at the site of palpable concern.   There is an abnormal 8 mm thickened right axillary lymph node.   There is skin thickening overlying the right breast. ------------------  A. BREAST, RIGHT AT 10:00, 6 CM FROM THE NIPPLE; ULTRASOUND-GUIDED CORE  NEEDLE BIOPSY:  - INVASIVE MAMMARY CARCINOMA, NO SPECIAL TYPE.   Size of invasive carcinoma: 11 mm in this sample  Histologic grade of invasive carcinoma: Grade 2                       Glandular/tubular differentiation score: 3                       Nuclear pleomorphism score: 2                       Mitotic rate score: 1  Total score: 6  Ductal carcinoma in situ: Present, intermediate grade  Lymphovascular invasion: Not identified   B. LYMPH NODE, RIGHT AXILLARY; ULTRASOUND-GUIDED CORE NEEDLE BIOPSY:  - 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 IN GREATEST  EXTENT.   Comment:  The malignancy in the primary breast lesion appears morphologically  different from tumor in the lymph node sampling, with tumor present in  lymph node more suggestive of a histologic grade 3, with significantly  increased mitotic activity and pleomorphism. Due to the discrepancy in  these morphologic patterns, ER/PR/HER2 immunohistochemistry will be  performed on both blocks A1 and B1, with reflex  to [place] for HER2 2+. The  results will be reported in an addendum.   ADDENDUM:  CASE SUMMARY: BREAST BIOMARKER TESTS - A - BREAST, RIGHT AT 10:00  Estrogen Receptor (ER) Status: POSITIVE          Percentage of cells with nuclear positivity: 71-80%          Average intensity of staining: Strong   Progesterone Receptor (PgR) Status: POSITIVE          Percentage of cells with nuclear positivity: 81-90%          Average intensity of staining: Strong   HER2 (by immunohistochemistry): POSITIVE (Score 3+)       Percentage of cells with uniform intense complete membrane  staining: 61-70%  HER2 displays a heterogeneous staining pattern, with areas showing a  strong 3+ staining pattern, and other regions with complete absence (0+)  of staining.   Ki-67: Not performed   CASE SUMMARY: BREAST BIOMARKER TESTS - B - RIGHT AXILLARY LYMPH NODE  Estrogen Receptor (ER) Status: POSITIVE          Percentage of cells with nuclear positivity: 81-90%          Average intensity of staining: Strong   Progesterone Receptor (PgR) Status: POSITIVE          Percentage of cells with nuclear positivity: 51-60%          Average intensity of staining: Strong   HER2 (by immunohistochemistry): NEGATIVE (Score 1+)  Ki-67: Not performed   # June 7th, 2023- TCH+P; skin biopsy pending; June 6th PET-pending    Carcinoma of upper-outer quadrant of right breast in female, estrogen receptor positive ([place])  10/26/2021 Initial Diagnosis   Carcinoma of upper-outer quadrant of right breast in female, estrogen receptor positive ([place])   10/26/2021 Cancer Staging   Staging form: Breast, AJCC 8th Edition - Clinical: Stage IB (cT2, cN1, cM0, G2, ER+, PR+, HER2+) - Signed by Cammie Sickle, MD on 10/26/2021 Histologic grading system: 3 grade system   11/11/2021 -  Chemotherapy   Patient is on Treatment Plan : BREAST  Docetaxel + Carboplatin + Trastuzumab + Pertuzumab  (TCHP) q21d        ALLERGIES:  is allergic to  metformin.  MEDICATIONS:  Current Outpatient Medications  Medication Sig Dispense Refill   calcium-vitamin D (OSCAL WITH D) 500-5 MG-MCG tablet Take 1 tablet by mouth 2 (two) times daily. 60 tablet 2   ciprofloxacin (CIPRO) 500 MG tablet Take 1 tablet (500 mg total) by mouth 2 (two) times daily. 14 tablet 0   dexamethasone (DECADRON) 4 MG tablet One pill twice a day - Start the day prior to chemo; do not take the day of chemo. 40 tablet 0   ferrous sulfate 325 (65 FE) MG tablet Take 325 mg by mouth 2 (two) times daily with a meal.  lidocaine-prilocaine (EMLA) cream Apply on the port. 30 -45 min  prior to port access. 30 g 3   magic mouthwash w/lidocaine SOLN Take 5 mLs by mouth 4 (four) times daily as needed for mouth pain. Sig: Swish/Swallow 5-10 ml four times a day as needed. Dispense 480 ml. 1RF 480 mL 1   norethindrone (MICRONOR) 0.35 MG tablet Take 1 tablet (0.35 mg total) by mouth daily. 84 tablet 2   ondansetron (ZOFRAN-ODT) 8 MG disintegrating tablet Take 1 tablet (8 mg total) by mouth every 8 (eight) hours as needed for nausea or vomiting. 45 tablet 1   ONETOUCH VERIO test strip SMARTSIG:1 Each Via Meter Daily PRN     OZEMPIC, 0.25 OR 0.5 MG/DOSE, 2 MG/1.5ML SOPN PLEASE SEE ATTACHED FOR DETAILED DIRECTIONS     prochlorperazine (COMPAZINE) 10 MG tablet Take 1 tablet (10 mg total) by mouth every 6 (six) hours as needed for nausea or vomiting. 40 tablet 1   No current facility-administered medications for this visit.    VITAL SIGNS: BP 126/84 (BP Location: Left Arm, Patient Position: Sitting)   Pulse 84   Temp (!) 97.4 F (36.3 C) (Tympanic)   Resp 17   Wt 221 lb 4.8 oz (100.4 kg)   LMP 11/06/2021 (Exact Date)   SpO2 100%   BMI 36.83 kg/m  Filed Weights   11/25/21 0913  Weight: 221 lb 4.8 oz (100.4 kg)    Estimated body mass index is 36.83 kg/m as calculated from the following:   Height as of 11/16/21: _0  (1.651 m).   Weight as of this encounter: 221 lb 4.8 oz (100.4  kg).  LABS: CBC:    Component Value Date/Time   WBC 10.2 11/25/2021 0856   HGB 13.2 11/25/2021 0856   HGB 7.8 (L) 04/23/2021 1431   HCT 40.2 11/25/2021 0856   HCT 25.5 (L) 04/23/2021 1431   PLT 130 (L) 11/25/2021 0856   PLT 384 04/23/2021 1431   MCV 85.5 11/25/2021 0856   MCV 84 04/23/2021 1431   NEUTROABS 18.8 (H) 11/20/2021 1333   NEUTROABS 5.4 04/23/2021 1431   LYMPHSABS 5.0 (H) 11/20/2021 1333   LYMPHSABS 2.2 04/23/2021 1431   MONOABS 1.1 (H) 11/20/2021 1333   EOSABS 1.1 (H) 11/20/2021 1333   EOSABS 0.2 04/23/2021 1431   BASOSABS 0.0 11/20/2021 1333   BASOSABS 0.1 04/23/2021 1431   Comprehensive Metabolic Panel:    Component Value Date/Time   NA 136 11/25/2021 0856   NA 143 09/26/2017 1512   K 3.6 11/25/2021 0856   CL 100 11/25/2021 0856   CO2 29 11/25/2021 0856   BUN 9 11/25/2021 0856   BUN 9 09/26/2017 1512   CREATININE 0.85 11/25/2021 0856   GLUCOSE 132 (H) 11/25/2021 0856   CALCIUM 8.7 (L) 11/25/2021 0856   AST 36 11/16/2021 1334   ALT 56 (H) 11/16/2021 1334   ALKPHOS 58 11/16/2021 1334   BILITOT 0.4 11/16/2021 1334   BILITOT <0.2 09/26/2017 1512   PROT 7.0 11/16/2021 1334   PROT 6.8 09/26/2017 1512   ALBUMIN 3.5 11/16/2021 1334   ALBUMIN 3.9 09/26/2017 1512    RADIOGRAPHIC STUDIES: MR BREAST BILATERAL W WO CONTRAST INC CAD  Result Date: 11/17/2021 CLINICAL DATA:  Newly diagnosed right breast cancer. EXAM: BILATERAL BREAST MRI WITH AND WITHOUT CONTRAST TECHNIQUE: Multiplanar, multisequence MR images of both breasts were obtained prior to and following the intravenous administration of 10 ml of Gadavist Three-dimensional MR images were rendered by post-processing of the original MR data  on an independent workstation. The three-dimensional MR images were interpreted, and findings are reported in the following complete MRI report for this study. Three dimensional images were evaluated at the independent interpreting workstation using the DynaCAD thin client.  COMPARISON:  Mammography and ultrasound dated October 01, 2021 FINDINGS: Breast composition: d. Extreme fibroglandular tissue. Background parenchymal enhancement: Moderate. Right breast: The patient's known malignancy is located in the upper outer quadrant of the right breast measuring 3.8 by 2.8 by 3.6 cm in AP, transverse, and craniocaudal dimensions. Numerous other suspicious masses are identified in the right breast. A cluster of 3 linearly arrayed masses are identified in the upper outer right breast on series 6, images 38-41. These masses are located anterior and inferior to the known biopsy proven malignancy. The largest of these 3 masses is best seen on series 6, image 40 measuring 6 mm in size with a mixture of persistent, plateau, and washout kinetics. The largest of these masses is located approximately 2 cm anterior and inferior to the known malignancy. The smallest and most anterior these masses is located 2.5 cm anterior and inferior to the known malignancy. There are multiple a apparent satellite lesions medial and posterior to the known malignancy as identified on series 6, image 52. There is an additional satellite lesion just posterior to the known malignancy on series 6, image 60 measuring 4 mm. There are 2 adjacent masses anterior and medial to the known malignancy as identified on series 6, images 55 and 59. The largest of these 2 masses on image 59 measures 8 mm. These 2 masses demonstrate primarily persistent kinetics with a tiny amount of plateau kinetics. An additional mass is identified superior, anterior, and medial to the known malignancy as identified on series 6, image 67 with persistent kinetics. This mass measures 5 mm. The distance between the anterior-most and posterior most suspected satellite lesions is 7.5 cm. The transverse dimension between the lateral-most and medial most suspected satellite lesions is 5.3 cm. The distance between the superior most and inferior most satellite  lesions is 4.3 cm. Multiple foci of enhancement are identified in the skin. Two adjacent foci are identified on series 6, image 76 in the upper breast. Other potential skin lesions are identified on series 6, images 73, 70, 60, and 50. There is diffuse skin thickening. Left breast: No mass or abnormal enhancement. Lymph nodes: The biopsied lymph node in the right axilla is identified with cortical thickening and adjacent HydroMARK clip. There is a lymph node just superior and deep to the biopsied node, also abnormal in appearance as identified on series 6, image 74. No other definitive abnormal nodes identified in the right axilla. No abnormal left axillary nodes. There are 2 mildly prominent right internal mammary nodes as seen on series 6, images 66 and 81. These 2 nodes are not FDG avid on the recent PET-CT. Ancillary findings:  None. IMPRESSION: 1. The patient's known malignancy in the upper outer quadrant of the right breast measures 3.8 x 2.8 x 3.6 cm. Numerous other suspicious masses are identified in the right breast as above involving the upper outer and upper inner quadrants, located both anterior and posterior to the known malignancy. The distance between the most anterior and posterior suspected satellite lesions at 7.5 cm. The distance between the most lateral and most medial suspected satellite lesions is 5.3 cm. The distance between the most superior and inferior satellite lesions is 4.3 cm. 2. Numerous enhancing foci in the skin as above are worrisome  for skin metastases. 3. Biopsy proven metastatic node in the right axilla. An adjacent node is also abnormal in appearance as above. 4. No evidence of malignancy in the left breast. 5. Two mildly prominent right internal mammary nodes were not FDG avid on recent PET-CT imaging. Recommend attention on follow-up. RECOMMENDATION: Recommend continued surgical and oncologic follow up. The most superomedial mass in the right breast on series 6, image 67, the  most inferior mass in the upper outer right breast on series 6, image 39, and the most posteriorly located masses in the right breast on series 6, image 52 could be biopsied under MRI guidance if it would change clinical management. Additional biopsies may be helpful if breast conservation is being considered. Recommend clinical assessment of the patient's thickened skin given the suspicion for skin metastases. BI-RADS CATEGORY  4: Suspicious. Electronically Signed   By: Dorise Bullion III M.D.   On: 11/17/2021 12:13  NM PET Image Initial (PI) Skull Base To Thigh (F-18 FDG)  Result Date: 11/11/2021 CLINICAL DATA:  Initial treatment strategy for new diagnosis of right breast cancer. EXAM: NUCLEAR MEDICINE PET SKULL BASE TO THIGH TECHNIQUE: 12.33 mCi F-18 FDG was injected intravenously. Full-ring PET imaging was performed from the skull base to thigh after the radiotracer. CT data was obtained and used for attenuation correction and anatomic localization. Fasting blood glucose: 89 mg/dl COMPARISON:  Recent mammograms and breast ultrasound. FINDINGS: Mediastinal blood pool activity: SUV max 1.88 Liver activity: SUV max NA NECK: No hypermetabolic lymph nodes in the neck. Incidental CT findings: none CHEST: Chest 3 cm right lateral breast mass is hypermetabolic with SUV max of 7.42 and consistent with known breast cancer. Adjacent fluid collection is likely postop/post biopsy liquified hematoma. A small biopsy clip is noted. Small scattered right axillary lymph nodes demonstrate mild hypermetabolism with SUV max of 5.08. These are suspicious for metastatic disease. No enlarged or hypermetabolic mediastinal or hilar lymph nodes. No worrisome pulmonary nodules to suggest pulmonary metastatic disease. No left-sided breast mass. Incidental CT findings: The left-sided Port-A-Cath is in good position without complicating features. ABDOMEN/PELVIS: No abnormal hypermetabolic activity within the liver, pancreas, adrenal  glands, or spleen. No hypermetabolic lymph nodes in the abdomen or pelvis. Incidental CT findings: none SKELETON: No focal hypermetabolic activity to suggest skeletal metastasis. Incidental CT findings: none IMPRESSION: 1. Hypermetabolic right lateral breast mass consistent with known breast cancer. 2. Small but hypermetabolic right axillary nodes suggesting metastatic adenopathy. 3. No findings for metastatic disease involving the chest, abdomen/pelvis or bony structures. Electronically Signed   By: Marijo Sanes M.D.   On: 11/11/2021 15:52   DG Chest Port 1 View  Result Date: 11/09/2021 CLINICAL DATA:  Port-A-Cath insertion. EXAM: PORTABLE CHEST 1 VIEW COMPARISON:  None Available. FINDINGS: Heart size is normal. A left subclavian Port-A-Cath is in place. The tip of the catheter is in the right atrium, proximally 4 cm beyond the cavoatrial junction. Lung volumes are low. Mild atelectasis is present at the lung bases. No pneumothorax is present. IMPRESSION: 1. Tip of left subclavian Port-A-Cath is in the right atrium, approximately 4 cm beyond the cavoatrial junction. 2. Low lung volumes and mild bibasilar atelectasis. These results will be called to the ordering clinician or representative by the Radiologist Assistant, and communication documented in the PACS or [employer]. Electronically Signed   By: San Morelle M.D.   On: 11/09/2021 10:06   DG C-Arm 1-60 Min-No Report  Result Date: 11/09/2021 Fluoroscopy was utilized by the requesting  physician.  No radiographic interpretation.   NM Cardiac Muga Rest  Result Date: 11/04/2021 CLINICAL DATA:  Breast cancer.  Pre chemotherapy. EXAM: NUCLEAR MEDICINE CARDIAC BLOOD POOL IMAGING (MUGA) TECHNIQUE: Cardiac multi-gated acquisition was performed at rest following intravenous injection of Tc-53mlabeled red blood cells. RADIOPHARMACEUTICALS:  21.24 mCi Tc-926mertechnetate in-vitro labeled red blood cells IV COMPARISON:  None Available. FINDINGS:  The seen a images demonstrate normal left ventricular wall motion. No akinetic or dyskinetic segments are identified. The calculated ejection fraction is 62.1. IMPRESSION: Ejection fraction is 62.1 Electronically Signed   By: P.Marijo Sanes.D.   On: 11/04/2021 16:02    PERFORMANCE STATUS (ECOG) : 0 - Asymptomatic  Review of Systems Unless otherwise noted, a complete review of systems is negative.  Physical Exam General: NAD Cardiovascular: regular rate and rhythm Pulmonary: clear anterior/posterior fields Abdomen: soft, nontender, + bowel sounds GU: no suprapubic tenderness Extremities: no edema, no joint deformities Skin: no rashes Neurological: Nonfocal  Assessment and Plan- Patient is a [age].o. 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  Patient received first cycle of TCHP chemotherapy last week followed by UdEllen Henri   Weight Loss-resolved with better oral intake.  Continue to eat healthy diet and boost or Ensure drinks to help with caloric intake.  Elevated creatinine-appeared at last visit and was thought to be due to decreased fluid intake.  This has improved with fluid correction.  She will continue drinking about 40 ounces of fluids per day including some electrolytes.  She will follow-up for her chemotherapy appointment next week.   Patient expressed understanding and was in agreement with this plan. She also understands that She can call clinic at any time with any questions, concerns, or complaints.   Thank you for allowing me to participate in the care of this very pleasant patient.   Time Total: 15 minutes  Visit consisted of counseling and education dealing with the complex and emotionally intense issues of symptom management in the setting of serious illness.Greater than 50%  of this time was spent counseling and coordinating care related to the above assessment and plan.  Signed by: SaNelwyn SalisburyA-C

## 2021-11-28 LAB — URINE CULTURE: Culture: 50000 — AB

## 2021-11-30 ENCOUNTER — Telehealth: Payer: Self-pay

## 2021-11-30 NOTE — Telephone Encounter (Signed)
Called pt to f/u on UTI symptoms. Pt reports that symptoms are gone and she feels that UTI has resolved. Encouraged pt to complete antibiotic.

## 2021-12-01 ENCOUNTER — Ambulatory Visit: Payer: Self-pay | Admitting: Licensed Clinical Social Worker

## 2021-12-01 ENCOUNTER — Telehealth: Payer: Self-pay | Admitting: Licensed Clinical Social Worker

## 2021-12-01 ENCOUNTER — Inpatient Hospital Stay: Payer: 59

## 2021-12-01 ENCOUNTER — Encounter: Payer: Self-pay | Admitting: Internal Medicine

## 2021-12-01 ENCOUNTER — Inpatient Hospital Stay (HOSPITAL_BASED_OUTPATIENT_CLINIC_OR_DEPARTMENT_OTHER): Payer: 59 | Admitting: Internal Medicine

## 2021-12-01 ENCOUNTER — Encounter: Payer: Self-pay | Admitting: Licensed Clinical Social Worker

## 2021-12-01 DIAGNOSIS — Z1379 Encounter for other screening for genetic and chromosomal anomalies: Secondary | ICD-10-CM

## 2021-12-01 DIAGNOSIS — Z170 Estrogen receptor positive status [ER+]: Secondary | ICD-10-CM

## 2021-12-01 DIAGNOSIS — C50411 Malignant neoplasm of upper-outer quadrant of right female breast: Secondary | ICD-10-CM | POA: Diagnosis not present

## 2021-12-01 DIAGNOSIS — Z5112 Encounter for antineoplastic immunotherapy: Secondary | ICD-10-CM | POA: Diagnosis not present

## 2021-12-01 LAB — CBC WITH DIFFERENTIAL/PLATELET
Abs Immature Granulocytes: 0.08 10*3/uL — ABNORMAL HIGH (ref 0.00–0.07)
Basophils Absolute: 0.1 10*3/uL (ref 0.0–0.1)
Basophils Relative: 1 %
Eosinophils Absolute: 0 10*3/uL (ref 0.0–0.5)
Eosinophils Relative: 0 %
HCT: 37.4 % (ref 36.0–46.0)
Hemoglobin: 12.3 g/dL (ref 12.0–15.0)
Immature Granulocytes: 1 %
Lymphocytes Relative: 8 %
Lymphs Abs: 0.7 10*3/uL (ref 0.7–4.0)
MCH: 28.5 pg (ref 26.0–34.0)
MCHC: 32.9 g/dL (ref 30.0–36.0)
MCV: 86.8 fL (ref 80.0–100.0)
Monocytes Absolute: 0.1 10*3/uL (ref 0.1–1.0)
Monocytes Relative: 1 %
Neutro Abs: 8.1 10*3/uL — ABNORMAL HIGH (ref 1.7–7.7)
Neutrophils Relative %: 89 %
Platelets: 231 10*3/uL (ref 150–400)
RBC: 4.31 MIL/uL (ref 3.87–5.11)
RDW: 13.1 % (ref 11.5–15.5)
WBC: 9 10*3/uL (ref 4.0–10.5)
nRBC: 0 % (ref 0.0–0.2)

## 2021-12-01 LAB — COMPREHENSIVE METABOLIC PANEL
ALT: 41 U/L (ref 0–44)
AST: 29 U/L (ref 15–41)
Albumin: 3.7 g/dL (ref 3.5–5.0)
Alkaline Phosphatase: 65 U/L (ref 38–126)
Anion gap: 7 (ref 5–15)
BUN: 12 mg/dL (ref 6–20)
CO2: 27 mmol/L (ref 22–32)
Calcium: 9 mg/dL (ref 8.9–10.3)
Chloride: 101 mmol/L (ref 98–111)
Creatinine, Ser: 0.87 mg/dL (ref 0.44–1.00)
GFR, Estimated: >60 mL/min (ref >60)
Glucose, Bld: 207 mg/dL — ABNORMAL HIGH (ref 70–99)
Potassium: 4.3 mmol/L (ref 3.5–5.1)
Sodium: 135 mmol/L (ref 135–145)
Total Bilirubin: 0.4 mg/dL (ref 0.3–1.2)
Total Protein: 7 g/dL (ref 6.5–8.1)

## 2021-12-01 MED FILL — Fosaprepitant Dimeglumine For IV Infusion 150 MG (Base Eq): INTRAVENOUS | Qty: 5 | Status: AC

## 2021-12-01 MED FILL — Dexamethasone Sodium Phosphate Inj 100 MG/10ML: INTRAMUSCULAR | Qty: 1 | Status: AC

## 2021-12-01 NOTE — Progress Notes (Signed)
[facility] CONSULT NOTE  [name], [name], [name], [name]  CHIEF COMPLAINTS/PURPOSE OF CONSULTATION: Breast cancer  #  Oncology History Overview Note  MAY 2023-    Targeted ultrasound is performed, showing a 2.9 x 1.9 x 2.0 cm irregular hypoechoic mass right breast 10 o'clock position 6 cm from nipple at the site of palpable concern.   There is an abnormal 8 mm thickened right axillary lymph node.   There is skin thickening overlying the right breast. ------------------  A. BREAST, RIGHT AT 10:00, 6 CM FROM THE NIPPLE; ULTRASOUND-GUIDED CORE  NEEDLE BIOPSY:  - INVASIVE MAMMARY CARCINOMA, NO SPECIAL TYPE.   Size of invasive carcinoma: 11 mm in this sample  Histologic grade of invasive carcinoma: Grade 2                       Glandular/tubular differentiation score: 3                       Nuclear pleomorphism score: 2                       Mitotic rate score: 1                       Total score: 6  Ductal carcinoma in situ: Present, intermediate grade  Lymphovascular invasion: Not identified   B. LYMPH NODE, RIGHT AXILLARY; ULTRASOUND-GUIDED CORE NEEDLE BIOPSY:  - 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 IN GREATEST  EXTENT.   Comment:  The malignancy in the primary breast lesion appears morphologically  different from tumor in the lymph node sampling, with tumor present in  lymph node more suggestive of a histologic grade 3, with significantly  increased mitotic activity and pleomorphism. Due to the discrepancy in  these morphologic patterns, ER/PR/HER2 immunohistochemistry will be  performed on both blocks A1 and B1, with reflex to FISH for HER2 2+. The  results will be reported in an addendum.   ADDENDUM:  CASE SUMMARY: BREAST BIOMARKER TESTS - A - BREAST, RIGHT AT 10:00  Estrogen Receptor  (ER) Status: POSITIVE          Percentage of cells with nuclear positivity: 71-80%          Average intensity of staining: Strong   Progesterone Receptor (PgR) Status: POSITIVE          Percentage of cells with nuclear positivity: 81-90%          Average intensity of staining: Strong   HER2 (by immunohistochemistry): POSITIVE (Score 3+)       Percentage of cells with uniform intense complete membrane  staining: 61-70%  HER2 displays a heterogeneous staining pattern, with areas showing a  strong 3+ staining pattern, and other regions with complete absence (0+)  of staining.   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  Discussed with Dr. [name] tumor heterogeneity. BREAST MRI- JUNE 2023- The patient's known malignancy in the upper outer quadrant of the right breast measures 3.8 x 2.8 x 3.6 cm. Numerous other suspicious masses are identified in the right breast as above involving the upper outer and upper inner quadrants, located both anterior and posterior to the known malignancy.  Numerous enhancing foci in the skin as above are worrisome for skin metastases ; Bx proven. Biopsy proven metastatic node in the right axilla.  No evidence of malignancy in the left breast;  Two mildly prominent right internal mammary nodes were not FDG avid on recent PET-CT imaging.  Currently on neoadjuvant chemotherapy- TCH  plus P x6 cycles;  MUGA scan May 31st, 2023- -61%.    # June 7th, 2023- NEO-ADJUVANT CHEMO- TCH+P   # Genetic counseling s/p- NEG for any deleterious mutations.     Carcinoma of upper-outer quadrant of right breast in female, estrogen receptor positive (HCC)  10/26/2021 Initial Diagnosis   Carcinoma of upper-outer quadrant of right breast in female, estrogen receptor positive (HCC)   10/26/2021 Cancer Staging   Staging form: Breast, AJCC 8th Edition - Clinical: Stage IB (cT2, cN1, cM0, G2, ER+, PR+, HER2+) - Signed by [name], MD on 10/26/2021 Histologic grading system: 3 grade system   11/11/2021 -  Chemotherapy   Patient is on Treatment Plan : BREAST  Docetaxel + Carboplatin + Trastuzumab + Pertuzumab  (TCHP) q21d       Genetic Testing   Negative genetic testing. No pathogenic variants identified on the [facility] CancerNext-Expanded+RNA panel. The report date is 11/29/2021.  The CancerNext-Expanded + RNAinsight gene panel offered by [employer] and includes sequencing and rearrangement analysis for the following 77 genes: IP, ALK, APC*, ATM*, AXIN2, BAP1, BARD1, BLM, BMPR1A, BRCA1*, BRCA2*, BRIP1*, CDC73, CDH1*,CDK4, CDKN1B, CDKN2A, CHEK2*, CTNNA1, DICER1, FANCC, FH, FLCN, GALNT12, KIF1B, LZTR1, MAX, MEN1, MET, MLH1*, MSH2*, MSH3, MSH6*, MUTYH*, NBN, NF1*, NF2, NTHL1, PALB2*, PHOX2B, PMS2*, POT1, PRKAR1A, PTCH1, PTEN*, RAD51C*, RAD51D*,RB1, RECQL, RET, SDHA, SDHAF2, SDHB, SDHC, SDHD, SMAD4, SMARCA4, SMARCB1, SMARCE1, STK11, SUFU, TMEM127, TP53*,TSC1, TSC2, VHL and XRCC2 (sequencing and deletion/duplication); EGFR, EGLN1, HOXB13, KIT, MITF, PDGFRA, POLD1 and POLE (sequencing only); EPCAM and GREM1 (deletion/duplication only).     HISTORY OF PRESENTING ILLNESS: Patient is ambulating independently.  Accompanied by her husband.   Marilyn Page 46 y.o.  female right breast TRIPLE POSITIVE Stage Ib  currently on neoadjuvant TCH plus P chemotherapy is here for follow-up/review/Breast  MRI.  Patient currently status post neoadjuvant TCH plus P cycle #1  In the interim patient was diagnosed with UTI is treated with antibiotics.  Also had a nosebleed resolved.  Mild mucositis improved with Magic mouthwash.  Complains of fatigue. No skin rash. No diarrhea.   Review of Systems  Constitutional:  Negative for chills, diaphoresis, fever, malaise/fatigue and weight loss.  HENT:  Negative for nosebleeds and sore throat.   Eyes:  Negative for double vision.  Respiratory:  Negative for cough, hemoptysis, sputum production, shortness of breath and wheezing.   Cardiovascular:  Negative for chest pain, palpitations, orthopnea and leg swelling.  Gastrointestinal:  Negative for abdominal pain, blood in stool, constipation, diarrhea, heartburn, melena, nausea and vomiting.  Genitourinary:  Negative for dysuria,  frequency and urgency.  Musculoskeletal:  Negative for back pain and joint pain.  Skin: Negative.  Negative for itching and rash.  Neurological:  Negative for dizziness, tingling, focal weakness, weakness and headaches.  Endo/Heme/Allergies:  Does not bruise/bleed easily.  Psychiatric/Behavioral:  Negative for depression. The patient is not nervous/anxious and does not have insomnia.      MEDICAL HISTORY:  Past Medical History:  Diagnosis Date   Carcinoma of upper-outer quadrant of right breast in female, estrogen receptor positive (HCC) 10/26/2021   Diabetes mellitus without complication (HCC)    History of gestational diabetes     SURGICAL HISTORY: Past Surgical History:  Procedure Laterality Date   BREAST BIOPSY Right 10/19/2021   [place] Core bx 10:00 6 cmfn Ribbon Clip. Axilla Butterfly hydro clip- path pending   CESAREAN SECTION  2005/2008   DILATION AND CURETTAGE OF UTERUS  2003   PORTACATH PLACEMENT Left 11/09/2021   Procedure: INSERTION PORT-A-CATH;  Surgeon: Earline Mayotte, MD;  Location: [facility] ORS;  Service: General;  Laterality: Left;   SKIN BIOPSY Right  11/09/2021   Procedure: PUNCH BIOPSY SKIN, TATTOO LYMPH NODE RIGHT AXILLA;  Surgeon: Earline Mayotte, MD;  Location: [facility] ORS;  Service: General;  Laterality: Right;    SOCIAL HISTORY: Social History   Socioeconomic History   Marital status: Married    Spouse name: Fraser Din   Number of children: 2   Years of education: Not on file   Highest education level: Not on file  Occupational History   Occupation: and Architectural technologist  Tobacco Use   Smoking status: Never   Smokeless tobacco: Never  Vaping Use   Vaping Use: Never used  Substance and Sexual Activity   Alcohol use: Never   Drug use: Never   Sexual activity: Yes    Birth control/protection: Other-see comments, Surgical    Comment: vasectomy  Other Topics Concern   Not on file  Social History Narrative   Pre-school teacher; in [place]; 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    Social Determinants of Health   Financial Resource Strain: Not on file  Food Insecurity: Not on file  Transportation Needs: Not on file  Physical Activity: Inactive (09/26/2017)   Exercise Vital Sign    Days of Exercise per Week: 0 days    Minutes of Exercise per Session: 0 min  Stress: Not on file  Social Connections: Not on file  Intimate Partner Violence: Not on file    FAMILY HISTORY: Family History  Problem Relation Age of Onset   Ovarian cysts Mother    Migraines Mother    Melanoma Mother        on leg   Hyperlipidemia Father    Diabetes Maternal Aunt    Stomach cancer Paternal Aunt    Diabetes Maternal Grandfather    Lymphoma Cousin 17   Breast cancer Neg Hx     ALLERGIES:  is allergic to metformin.  MEDICATIONS:  Current Outpatient Medications  Medication Sig Dispense Refill   calcium-vitamin D (OSCAL WITH D) 500-5 MG-MCG tablet Take 1 tablet by mouth 2 (two) times daily. 60 tablet 2   ciprofloxacin (CIPRO) 500 MG tablet Take 1 tablet (500 mg total) by mouth 2 (two) times daily. 14 tablet 0   dexamethasone (DECADRON) 4 MG tablet One  pill twice a day - Start the day prior to chemo; do not take the day of chemo. 40 tablet 0   ferrous sulfate 325 (65 FE) MG tablet Take 325 mg by mouth 2 (  two) times daily with a meal.     lidocaine-prilocaine (EMLA) cream Apply on the port. 30 -45 min  prior to port access. 30 g 3   norethindrone (MICRONOR) 0.35 MG tablet Take 1 tablet (0.35 mg total) by mouth daily. 84 tablet 2   ondansetron (ZOFRAN-ODT) 8 MG disintegrating tablet Take 1 tablet (8 mg total) by mouth every 8 (eight) hours as needed for nausea or vomiting. 45 tablet 1   ONETOUCH VERIO test strip SMARTSIG:1 Each Via Meter Daily PRN     OZEMPIC, 0.25 OR 0.5 MG/DOSE, 2 MG/1.5ML SOPN PLEASE SEE ATTACHED FOR DETAILED DIRECTIONS     prochlorperazine (COMPAZINE) 10 MG tablet Take 1 tablet (10 mg total) by mouth every 6 (six) hours as needed for nausea or vomiting. 40 tablet 1   magic mouthwash w/lidocaine SOLN Take 5 mLs by mouth 4 (four) times daily as needed for mouth pain. Sig: Swish/Swallow 5-10 ml four times a day as needed. Dispense 480 ml. 1RF (Patient not taking: Reported on 12/01/2021) 480 mL 1   No current facility-administered medications for this visit.      Marland Kitchen  PHYSICAL EXAMINATION: ECOG PERFORMANCE STATUS: 0 - Asymptomatic  Vitals:   12/01/21 1313  BP: (!) 131/53  Pulse: 89  Temp: 98.5 F (36.9 C)  SpO2: 99%    Filed Weights   12/01/21 1313  Weight: 224 lb 3.2 oz (101.7 kg)    Right breast-10:00 vague 3 to 4 cm mass noted-with skin thickening/ ?  Biopsy changes.  No nipple changes no erythema.   Physical Exam Vitals and nursing note reviewed.  HENT:     Head: Normocephalic and atraumatic.     Mouth/Throat:     Pharynx: Oropharynx is clear.  Eyes:     Extraocular Movements: Extraocular movements intact.     Pupils: Pupils are equal, round, and reactive to light.  Cardiovascular:     Rate and Rhythm: Normal rate and regular rhythm.  Pulmonary:     Comments: Decreased breath sounds bilaterally.   Abdominal:     Palpations: Abdomen is soft.  Musculoskeletal:        General: Normal range of motion.     Cervical back: Normal range of motion.  Skin:    General: Skin is warm.  Neurological:     General: No focal deficit present.     Mental Status: She is alert and oriented to person, place, and time.  Psychiatric:        Behavior: Behavior normal.        Judgment: Judgment normal.      LABORATORY DATA:  I have reviewed the data as listed Lab Results  Component Value Date   WBC 9.0 12/01/2021   HGB 12.3 12/01/2021   HCT 37.4 12/01/2021   MCV 86.8 12/01/2021   PLT 231 12/01/2021   Recent Labs    11/10/21 1510 11/16/21 1334 11/20/21 1333 11/25/21 0856 12/01/21 1304  NA 141 136 137 136 135  K 3.4* 3.7 3.5 3.6 4.3  CL 106 101 99 100 101  CO2 28 29 29 29 27   GLUCOSE 107* 142* 185* 132* 207*  BUN 11 12 10 9 12   CREATININE 0.78 0.70 1.09* 0.85 0.87  CALCIUM 8.6* 8.3* 8.7* 8.7* 9.0  GFRNONAA >60 >60 >60 >60 >60  PROT 7.1 7.0  --   --  7.0  ALBUMIN 3.5 3.5  --   --  3.7  AST 17 36  --   --  29  ALT 17 56*  --   --  41  ALKPHOS 54 58  --   --  65  BILITOT 0.4 0.4  --   --  0.4    RADIOGRAPHIC STUDIES: I have personally reviewed the radiological images as listed and agreed with the findings in the report. MR BREAST BILATERAL W WO CONTRAST INC CAD  Result Date: 11/17/2021 CLINICAL DATA:  Newly diagnosed right breast cancer. EXAM: BILATERAL BREAST MRI WITH AND WITHOUT CONTRAST TECHNIQUE: Multiplanar, multisequence MR images of both breasts were obtained prior to and following the intravenous administration of 10 ml of Gadavist Three-dimensional MR images were rendered by post-processing of the original MR data on an independent workstation. The three-dimensional MR images were interpreted, and findings are reported in the following complete MRI report for this study. Three dimensional images were evaluated at the independent interpreting workstation using the DynaCAD  thin client. COMPARISON:  Mammography and ultrasound dated October 01, 2021 FINDINGS: Breast composition: d. Extreme fibroglandular tissue. Background parenchymal enhancement: Moderate. Right breast: The patient's known malignancy is located in the upper outer quadrant of the right breast measuring 3.8 by 2.8 by 3.6 cm in AP, transverse, and craniocaudal dimensions. Numerous other suspicious masses are identified in the right breast. A cluster of 3 linearly arrayed masses are identified in the upper outer right breast on series 6, images 38-41. These masses are located anterior and inferior to the known biopsy proven malignancy. The largest of these 3 masses is best seen on series 6, image 40 measuring 6 mm in size with a mixture of persistent, plateau, and washout kinetics. The largest of these masses is located approximately 2 cm anterior and inferior to the known malignancy. The smallest and most anterior these masses is located 2.5 cm anterior and inferior to the known malignancy. There are multiple a apparent satellite lesions medial and posterior to the known malignancy as identified on series 6, image 52. There is an additional satellite lesion just posterior to the known malignancy on series 6, image 60 measuring 4 mm. There are 2 adjacent masses anterior and medial to the known malignancy as identified on series 6, images 55 and 59. The largest of these 2 masses on image 59 measures 8 mm. These 2 masses demonstrate primarily persistent kinetics with a tiny amount of plateau kinetics. An additional mass is identified superior, anterior, and medial to the known malignancy as identified on series 6, image 58 with persistent kinetics. This mass measures 5 mm. The distance between the anterior-most and posterior most suspected satellite lesions is 7.5 cm. The transverse dimension between the lateral-most and medial most suspected satellite lesions is 5.3 cm. The distance between the superior most and inferior most  satellite lesions is 4.3 cm. Multiple foci of enhancement are identified in the skin. Two adjacent foci are identified on series 6, image 76 in the upper breast. Other potential skin lesions are identified on series 6, images 73, 70, 60, and 50. There is diffuse skin thickening. Left breast: No mass or abnormal enhancement. Lymph nodes: The biopsied lymph node in the right axilla is identified with cortical thickening and adjacent HydroMARK clip. There is a lymph node just superior and deep to the biopsied node, also abnormal in appearance as identified on series 6, image 74. No other definitive abnormal nodes identified in the right axilla. No abnormal left axillary nodes. There are 2 mildly prominent right internal mammary nodes as seen on series 6, images 66 and 81. These 2 nodes  are not FDG avid on the recent PET-CT. Ancillary findings:  None. IMPRESSION: 1. The patient's known malignancy in the upper outer quadrant of the right breast measures 3.8 x 2.8 x 3.6 cm. Numerous other suspicious masses are identified in the right breast as above involving the upper outer and upper inner quadrants, located both anterior and posterior to the known malignancy. The distance between the most anterior and posterior suspected satellite lesions at 7.5 cm. The distance between the most lateral and most medial suspected satellite lesions is 5.3 cm. The distance between the most superior and inferior satellite lesions is 4.3 cm. 2. Numerous enhancing foci in the skin as above are worrisome for skin metastases. 3. Biopsy proven metastatic node in the right axilla. An adjacent node is also abnormal in appearance as above. 4. No evidence of malignancy in the left breast. 5. Two mildly prominent right internal mammary nodes were not FDG avid on recent PET-CT imaging. Recommend attention on follow-up. RECOMMENDATION: Recommend continued surgical and oncologic follow up. The most superomedial mass in the right breast on series 6,  image 67, the most inferior mass in the upper outer right breast on series 6, image 39, and the most posteriorly located masses in the right breast on series 6, image 52 could be biopsied under MRI guidance if it would change clinical management. Additional biopsies may be helpful if breast conservation is being considered. Recommend clinical assessment of the patient's thickened skin given the suspicion for skin metastases. BI-RADS CATEGORY  4: Suspicious. Electronically Signed   By: Gerome Sam III M.D.   On: 11/17/2021 12:13  NM PET Image Initial (PI) Skull Base To Thigh (F-18 FDG)  Result Date: 11/11/2021 CLINICAL DATA:  Initial treatment strategy for new diagnosis of right breast cancer. EXAM: NUCLEAR MEDICINE PET SKULL BASE TO THIGH TECHNIQUE: 12.33 mCi F-18 FDG was injected intravenously. Full-ring PET imaging was performed from the skull base to thigh after the radiotracer. CT data was obtained and used for attenuation correction and anatomic localization. Fasting blood glucose: 89 mg/dl COMPARISON:  Recent mammograms and breast ultrasound. FINDINGS: Mediastinal blood pool activity: SUV max 1.88 Liver activity: SUV max NA NECK: No hypermetabolic lymph nodes in the neck. Incidental CT findings: none CHEST: Chest 3 cm right lateral breast mass is hypermetabolic with SUV max of 8.94 and consistent with known breast cancer. Adjacent fluid collection is likely postop/post biopsy liquified hematoma. A small biopsy clip is noted. Small scattered right axillary lymph nodes demonstrate mild hypermetabolism with SUV max of 5.08. These are suspicious for metastatic disease. No enlarged or hypermetabolic mediastinal or hilar lymph nodes. No worrisome pulmonary nodules to suggest pulmonary metastatic disease. No left-sided breast mass. Incidental CT findings: The left-sided Port-A-Cath is in good position without complicating features. ABDOMEN/PELVIS: No abnormal hypermetabolic activity within the liver, pancreas,  adrenal glands, or spleen. No hypermetabolic lymph nodes in the abdomen or pelvis. Incidental CT findings: none SKELETON: No focal hypermetabolic activity to suggest skeletal metastasis. Incidental CT findings: none IMPRESSION: 1. Hypermetabolic right lateral breast mass consistent with known breast cancer. 2. Small but hypermetabolic right axillary nodes suggesting metastatic adenopathy. 3. No findings for metastatic disease involving the chest, abdomen/pelvis or bony structures. Electronically Signed   By: Rudie Meyer M.D.   On: 11/11/2021 15:52   DG Chest Port 1 View  Result Date: 11/09/2021 CLINICAL DATA:  Port-A-Cath insertion. EXAM: PORTABLE CHEST 1 VIEW COMPARISON:  None Available. FINDINGS: Heart size is normal. A left subclavian Port-A-Cath is in  place. The tip of the catheter is in the right atrium, proximally 4 cm beyond the cavoatrial junction. Lung volumes are low. Mild atelectasis is present at the lung bases. No pneumothorax is present. IMPRESSION: 1. Tip of left subclavian Port-A-Cath is in the right atrium, approximately 4 cm beyond the cavoatrial junction. 2. Low lung volumes and mild bibasilar atelectasis. These results will be called to the ordering clinician or representative by the Radiologist Assistant, and communication documented in the PACS or [employer]. Electronically Signed   By: Marin Roberts M.D.   On: 11/09/2021 10:06   DG C-Arm 1-60 Min-No Report  Result Date: 11/09/2021 Fluoroscopy was utilized by the requesting physician.  No radiographic interpretation.   NM Cardiac Muga Rest  Result Date: 11/04/2021 CLINICAL DATA:  Breast cancer.  Pre chemotherapy. EXAM: NUCLEAR MEDICINE CARDIAC BLOOD POOL IMAGING (MUGA) TECHNIQUE: Cardiac multi-gated acquisition was performed at rest following intravenous injection of Tc-27m labeled red blood cells. RADIOPHARMACEUTICALS:  21.24 mCi Tc-54m pertechnetate in-vitro labeled red blood cells IV COMPARISON:  None Available.  FINDINGS: The seen a images demonstrate normal left ventricular wall motion. No akinetic or dyskinetic segments are identified. The calculated ejection fraction is 62.1. IMPRESSION: Ejection fraction is 62.1 Electronically Signed   By: Rudie Meyer M.D.   On: 11/04/2021 16:02    ASSESSMENT & PLAN:   Carcinoma of upper-outer quadrant of right breast in female, estrogen receptor positive (HCC) # MAY 2023- STAGE III-T4N1 ER/PR positive HER2 POSITIVE right breast cancer;LN-positive-ER/PR positive however 2 NEGATIVE s/p biopsy [see discussion below].  Discussed with Dr. [name] tumor heterogeneity. BREAST MRI- JUNE 2023- The patient's known malignancy in the upper outer quadrant of the right breast measures 3.8 x 2.8 x 3.6 cm. Numerous other suspicious masses are identified in the right breast as above involving the upper outer and upper inner quadrants, located both anterior and posterior to the known malignancy.  Numerous enhancing foci in the skin as above are worrisome for skin metastases ; Bx proven. Biopsy proven metastatic node in the right axilla.  No evidence of malignancy in the left breast;  Two mildly prominent right internal mammary nodes were not FDG avid on recent PET-CT imaging.  Currently on neoadjuvant chemotherapy- TCH plus P x6 cycles;  MUGA scan May 31st, 2023- -61%.    # Proceed with cycle #2 tomorrow-  d-3 Greggory Keen.  Discussed regarding use of Claritin post growth factor.  # Borderline diabetes- on Ozempic once a week/fridays; fasting blood glucose-80s to 90s.  STABLE on chemo   #UTI-status post antibiotics improved/resolved  # Hypocalcemia- 8.7 - recommend  Ca+vit D.  Vitamin D levels at next visit.  # Body aches- sec to udenyca-grade 1 improved with Tylenol.  #Mucositis grade 1 improved with-salt water baking soda rinses; Magic mouthwash.  # Genetic counseling s/p- NEG for any deleterious mutations.  Reviewed with the patient.  D-2 chemo  # DISPOSITION: # proceed  with Chemo as planned tomorrow; D-2 Greggory Keen as planned. # follow up with NP in 10 days- labs- cbc/bmp; possible IVFs over 1 hours # follow up in 3 weeks-MD: labs- cbc/cmp;Taxotere+ Caboplatin+herceptin+Perjeta; D-2 Greggory Keen- Dr.B    All questions were answered. The patient/family knows to call the clinic with any problems, questions or concerns.       [name], MD 12/01/2021 9:29 PM

## 2021-12-02 ENCOUNTER — Inpatient Hospital Stay: Payer: 59

## 2021-12-02 VITALS — BP 138/84 | HR 87 | Temp 97.9°F | Resp 17

## 2021-12-02 DIAGNOSIS — Z5112 Encounter for antineoplastic immunotherapy: Secondary | ICD-10-CM | POA: Diagnosis not present

## 2021-12-02 DIAGNOSIS — C50411 Malignant neoplasm of upper-outer quadrant of right female breast: Secondary | ICD-10-CM

## 2021-12-02 MED ORDER — TRASTUZUMAB-DKST CHEMO 150 MG IV SOLR
6.0000 mg/kg | Freq: Once | INTRAVENOUS | Status: AC
  Administered 2021-12-02: 609 mg via INTRAVENOUS
  Filled 2021-12-02: qty 29

## 2021-12-02 MED ORDER — HEPARIN SOD (PORK) LOCK FLUSH 100 UNIT/ML IV SOLN
500.0000 [IU] | Freq: Once | INTRAVENOUS | Status: AC | PRN
  Administered 2021-12-02: 500 [IU]
  Filled 2021-12-02: qty 5

## 2021-12-02 MED ORDER — SODIUM CHLORIDE 0.9 % IV SOLN
420.0000 mg | Freq: Once | INTRAVENOUS | Status: AC
  Administered 2021-12-02: 420 mg via INTRAVENOUS
  Filled 2021-12-02: qty 14

## 2021-12-02 MED ORDER — SODIUM CHLORIDE 0.9 % IV SOLN
900.0000 mg | Freq: Once | INTRAVENOUS | Status: AC
  Administered 2021-12-02: 900 mg via INTRAVENOUS
  Filled 2021-12-02: qty 90

## 2021-12-02 MED ORDER — ACETAMINOPHEN 325 MG PO TABS
650.0000 mg | ORAL_TABLET | Freq: Once | ORAL | Status: AC
  Administered 2021-12-02: 650 mg via ORAL
  Filled 2021-12-02: qty 2

## 2021-12-02 MED ORDER — SODIUM CHLORIDE 0.9 % IV SOLN
Freq: Once | INTRAVENOUS | Status: AC
  Filled 2021-12-02: qty 250

## 2021-12-02 MED ORDER — DIPHENHYDRAMINE HCL 25 MG PO CAPS
50.0000 mg | ORAL_CAPSULE | Freq: Once | ORAL | Status: AC
  Administered 2021-12-02: 50 mg via ORAL
  Filled 2021-12-02: qty 2

## 2021-12-02 MED ORDER — SODIUM CHLORIDE 0.9 % IV SOLN
150.0000 mg | Freq: Once | INTRAVENOUS | Status: AC
  Administered 2021-12-02: 150 mg via INTRAVENOUS
  Filled 2021-12-02: qty 150

## 2021-12-02 MED ORDER — SODIUM CHLORIDE 0.9 % IV SOLN
75.0000 mg/m2 | Freq: Once | INTRAVENOUS | Status: AC
  Administered 2021-12-02: 160 mg via INTRAVENOUS
  Filled 2021-12-02: qty 16

## 2021-12-02 MED ORDER — PALONOSETRON HCL INJECTION 0.25 MG/5ML
0.2500 mg | Freq: Once | INTRAVENOUS | Status: AC
  Administered 2021-12-02: 0.25 mg via INTRAVENOUS
  Filled 2021-12-02: qty 5

## 2021-12-02 MED ORDER — SODIUM CHLORIDE 0.9 % IV SOLN
10.0000 mg | Freq: Once | INTRAVENOUS | Status: AC
  Administered 2021-12-02: 10 mg via INTRAVENOUS
  Filled 2021-12-02: qty 10

## 2021-12-02 NOTE — Patient Instructions (Signed)
[facility] [place]  Discharge Instructions: Thank you for choosing [facility] to provide your oncology and hematology care.  If you have a lab appointment with the [facility], please go directly to the [place] [facility] and check in at the registration area.  Wear comfortable clothing and clothing appropriate for easy access to any Portacath or PICC line.   We strive to give you quality time with your provider. You may need to reschedule your appointment if you arrive late (15 or more minutes).  Arriving late affects you and other patients whose appointments are after yours.  Also, if you miss three or more appointments without notifying the office, you may be dismissed from the clinic at the provider's discretion.      For prescription refill requests, have your pharmacy contact our office and allow 72 hours for refills to be completed.    Today you received the following chemotherapy and/or immunotherapy agents Ogivri, Perjeta, Taxotere and Carboplatin      To help prevent nausea and vomiting after your treatment, we encourage you to take your nausea medication as directed.  BELOW ARE SYMPTOMS THAT SHOULD BE REPORTED IMMEDIATELY: *FEVER GREATER THAN 100.4 F (38 C) OR HIGHER *CHILLS OR SWEATING *NAUSEA AND VOMITING THAT IS NOT CONTROLLED WITH YOUR NAUSEA MEDICATION *UNUSUAL SHORTNESS OF BREATH *UNUSUAL BRUISING OR BLEEDING *URINARY PROBLEMS (pain or burning when urinating, or frequent urination) *BOWEL PROBLEMS (unusual diarrhea, constipation, pain near the anus) TENDERNESS IN MOUTH AND THROAT WITH OR WITHOUT PRESENCE OF ULCERS (sore throat, sores in mouth, or a toothache) UNUSUAL RASH, SWELLING OR PAIN  UNUSUAL VAGINAL DISCHARGE OR ITCHING   Items with * indicate a potential emergency and should be followed up as soon as possible or go to the Emergency Department if any problems should occur.  Please show the CHEMOTHERAPY ALERT CARD or  IMMUNOTHERAPY ALERT CARD at check-in to the Emergency Department and triage nurse.  Should you have questions after your visit or need to cancel or reschedule your appointment, please contact [facility] [place] [facility] [place]  [phone] and follow the prompts.  Office hours are 8:00 a.m. to 4:30 p.m. Monday - Friday. Please note that voicemails left after 4:00 p.m. may not be returned until the following business day.  We are closed weekends and major holidays. You have access to a nurse at all times for urgent questions. Please call the main number to the clinic [phone] and follow the prompts.  For any non-urgent questions, you may also contact your provider using MyChart. We now offer e-Visits for anyone 63 and older to request care online for non-urgent symptoms. For details visit [email or web address].   Also download the MyChart app! Go to the app store, search "MyChart", open the app, select [place], and log in with your MyChart username and password.  Masks are optional in the cancer centers. If you would like for your care team to wear a mask while they are taking care of you, please let them know. For doctor visits, patients may have with them one support person who is at least 47 years old. At this time, visitors are not allowed in the infusion area.

## 2021-12-03 ENCOUNTER — Inpatient Hospital Stay: Payer: 59

## 2021-12-03 DIAGNOSIS — Z5112 Encounter for antineoplastic immunotherapy: Secondary | ICD-10-CM | POA: Diagnosis not present

## 2021-12-03 DIAGNOSIS — C50411 Malignant neoplasm of upper-outer quadrant of right female breast: Secondary | ICD-10-CM

## 2021-12-03 MED ORDER — PEGFILGRASTIM-BMEZ 6 MG/0.6ML ~~LOC~~ SOSY
6.0000 mg | PREFILLED_SYRINGE | Freq: Once | SUBCUTANEOUS | Status: AC
  Administered 2021-12-03: 6 mg via SUBCUTANEOUS
  Filled 2021-12-03: qty 0.6

## 2021-12-10 ENCOUNTER — Inpatient Hospital Stay: Payer: 59 | Admitting: Medical Oncology

## 2021-12-10 ENCOUNTER — Inpatient Hospital Stay: Payer: 59 | Attending: Internal Medicine

## 2021-12-10 ENCOUNTER — Inpatient Hospital Stay: Payer: 59

## 2021-12-10 VITALS — BP 124/62 | HR 96 | Temp 98.2°F | Resp 16 | Wt 215.5 lb

## 2021-12-10 DIAGNOSIS — C50411 Malignant neoplasm of upper-outer quadrant of right female breast: Secondary | ICD-10-CM

## 2021-12-10 DIAGNOSIS — Z5112 Encounter for antineoplastic immunotherapy: Secondary | ICD-10-CM | POA: Insufficient documentation

## 2021-12-10 DIAGNOSIS — K1230 Oral mucositis (ulcerative), unspecified: Secondary | ICD-10-CM | POA: Insufficient documentation

## 2021-12-10 DIAGNOSIS — R634 Abnormal weight loss: Secondary | ICD-10-CM | POA: Diagnosis not present

## 2021-12-10 DIAGNOSIS — R7303 Prediabetes: Secondary | ICD-10-CM | POA: Diagnosis not present

## 2021-12-10 DIAGNOSIS — Z5111 Encounter for antineoplastic chemotherapy: Secondary | ICD-10-CM | POA: Diagnosis present

## 2021-12-10 DIAGNOSIS — C773 Secondary and unspecified malignant neoplasm of axilla and upper limb lymph nodes: Secondary | ICD-10-CM | POA: Insufficient documentation

## 2021-12-10 DIAGNOSIS — R040 Epistaxis: Secondary | ICD-10-CM | POA: Insufficient documentation

## 2021-12-10 DIAGNOSIS — E860 Dehydration: Secondary | ICD-10-CM | POA: Diagnosis not present

## 2021-12-10 DIAGNOSIS — Z5189 Encounter for other specified aftercare: Secondary | ICD-10-CM | POA: Diagnosis not present

## 2021-12-10 DIAGNOSIS — Z170 Estrogen receptor positive status [ER+]: Secondary | ICD-10-CM

## 2021-12-10 DIAGNOSIS — R200 Anesthesia of skin: Secondary | ICD-10-CM | POA: Diagnosis not present

## 2021-12-10 DIAGNOSIS — R202 Paresthesia of skin: Secondary | ICD-10-CM | POA: Insufficient documentation

## 2021-12-10 LAB — CBC WITH DIFFERENTIAL/PLATELET
Abs Immature Granulocytes: 0.25 10*3/uL — ABNORMAL HIGH (ref 0.00–0.07)
Basophils Absolute: 0.1 10*3/uL (ref 0.0–0.1)
Basophils Relative: 1 %
Eosinophils Absolute: 0.1 10*3/uL (ref 0.0–0.5)
Eosinophils Relative: 1 %
HCT: 36.1 % (ref 36.0–46.0)
Hemoglobin: 12 g/dL (ref 12.0–15.0)
Immature Granulocytes: 5 %
Lymphocytes Relative: 27 %
Lymphs Abs: 1.3 10*3/uL (ref 0.7–4.0)
MCH: 28.3 pg (ref 26.0–34.0)
MCHC: 33.2 g/dL (ref 30.0–36.0)
MCV: 85.1 fL (ref 80.0–100.0)
Monocytes Absolute: 0.8 10*3/uL (ref 0.1–1.0)
Monocytes Relative: 16 %
Neutro Abs: 2.5 10*3/uL (ref 1.7–7.7)
Neutrophils Relative %: 50 %
Platelets: 148 10*3/uL — ABNORMAL LOW (ref 150–400)
RBC: 4.24 MIL/uL (ref 3.87–5.11)
RDW: 13 % (ref 11.5–15.5)
WBC: 5 10*3/uL (ref 4.0–10.5)
nRBC: 0.6 % — ABNORMAL HIGH (ref 0.0–0.2)

## 2021-12-10 LAB — BASIC METABOLIC PANEL
Anion gap: 8 (ref 5–15)
BUN: 9 mg/dL (ref 6–20)
CO2: 28 mmol/L (ref 22–32)
Calcium: 8.8 mg/dL — ABNORMAL LOW (ref 8.9–10.3)
Chloride: 100 mmol/L (ref 98–111)
Creatinine, Ser: 0.85 mg/dL (ref 0.44–1.00)
GFR, Estimated: >60 mL/min (ref >60)
Glucose, Bld: 129 mg/dL — ABNORMAL HIGH (ref 70–99)
Potassium: 3.3 mmol/L — ABNORMAL LOW (ref 3.5–5.1)
Sodium: 136 mmol/L (ref 135–145)

## 2021-12-10 NOTE — Progress Notes (Signed)
Returns for follow-up. Reports that she is feeling a little better since treatment last week. Had a 4 minute nose bleed on 7/4. Reports unpleasant taste to foods and beverage.

## 2021-12-10 NOTE — Progress Notes (Signed)
[facility] [place] NOTE  Patient Care Team: Latanya Maudlin, NP as PCP - General (Family Medicine) Daiva Huge, RN as Oncology Nurse Navigator Cammie Sickle, [name]  CHIEF COMPLAINTS/PURPOSE OF CONSULTATION: Breast cancer  #  Oncology History Overview Note  MAY 2023-    Targeted ultrasound is performed, showing a 2.9 x 1.9 x 2.0 cm irregular hypoechoic mass right breast 10 o'clock position 6 cm from nipple at the site of palpable concern.   There is an abnormal 8 mm thickened right axillary lymph node.   There is skin thickening overlying the right breast. ------------------  A. BREAST, RIGHT AT 10:00, 6 CM FROM THE NIPPLE; ULTRASOUND-GUIDED CORE  NEEDLE BIOPSY:  - INVASIVE MAMMARY CARCINOMA, NO SPECIAL TYPE.   Size of invasive carcinoma: 11 mm in this sample  Histologic grade of invasive carcinoma: Grade 2                       Glandular/tubular differentiation score: 3                       Nuclear pleomorphism score: 2                       Mitotic rate score: 1                       Total score: 6  Ductal carcinoma in situ: Present, intermediate grade  Lymphovascular invasion: Not identified   B. LYMPH NODE, RIGHT AXILLARY; ULTRASOUND-GUIDED CORE NEEDLE BIOPSY:  - 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 IN GREATEST  EXTENT.   Comment:  The malignancy in the primary breast lesion appears morphologically  different from tumor in the lymph node sampling, with tumor present in  lymph node more suggestive of a histologic grade 3, with significantly  increased mitotic activity and pleomorphism. Due to the discrepancy in  these morphologic patterns, ER/PR/HER2 immunohistochemistry will be  performed on both blocks A1 and B1, with reflex to [place] for HER2 2+. The  results will be reported in an addendum.   ADDENDUM:  CASE SUMMARY: BREAST BIOMARKER TESTS - A - BREAST, RIGHT AT 10:00  Estrogen Receptor  (ER) Status: POSITIVE          Percentage of cells with nuclear positivity: 71-80%          Average intensity of staining: Strong   Progesterone Receptor (PgR) Status: POSITIVE          Percentage of cells with nuclear positivity: 81-90%          Average intensity of staining: Strong   HER2 (by immunohistochemistry): POSITIVE (Score 3+)       Percentage of cells with uniform intense complete membrane  staining: 61-70%  HER2 displays a heterogeneous staining pattern, with areas showing a  strong 3+ staining pattern, and other regions with complete absence (0+)  of staining.   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  Discussed with Dr. Sed[place] Muta tumor heterogeneity. BREAST MRI- JUNE 2023- The patient's known malignancy in the upper outer quadrant of the right breast measures 3.8 x 2.8 x 3.6 cm. Numerous other suspicious masses are identified in the right breast as above involving the upper outer and upper inner quadrants, located both anterior and posterior to the known malignancy.  Numerous enhancing foci in the skin as above are worrisome for skin metastases ; Bx proven. Biopsy proven metastatic node in the right axilla.  No evidence of malignancy in the left breast;  Two mildly prominent right internal mammary nodes were not FDG avid on recent PET-CT imaging.  Currently on neoadjuvant chemotherapy- TCH  plus P x6 cycles;  MUGA scan May 31st, 2023- -61%.    # June 7th, 2023- NEO-ADJUVANT CHEMO- TCH+P   # Genetic counseling s/p- NEG for any deleterious mutations.     Carcinoma of upper-outer quadrant of right breast in female, estrogen receptor positive ([place])  10/26/2021 Initial Diagnosis   Carcinoma of upper-outer quadrant of right breast in female, estrogen receptor positive ([place])   10/26/2021 Cancer Staging   Staging form: Breast, AJCC 8th Edition - Clinical: Stage IB (cT2, cN1, cM0, G2, ER+, PR+, HER2+) - Signed by Cammie Sickle, MD on 10/26/2021 Histologic grading system: 3 grade system   11/11/2021 -  Chemotherapy   Patient is on Treatment Plan : BREAST  Docetaxel + Carboplatin + Trastuzumab + Pertuzumab  (TCHP) q21d       Genetic Testing   Negative genetic testing. No pathogenic variants identified on the [facility] CancerNext-Expanded+RNA panel. The report date is 11/29/2021.  The CancerNext-Expanded + RNAinsight gene panel offered by [employer] and includes sequencing and rearrangement analysis for the following 77 genes: IP, ALK, APC*, ATM*, AXIN2, BAP1, BARD1, BLM, BMPR1A, BRCA1*, BRCA2*, BRIP1*, CDC73, CDH1*,CDK4, CDKN1B, CDKN2A, CHEK2*, CTNNA1, DICER1, FANCC, FH, FLCN, GALNT12, KIF1B, LZTR1, MAX, MEN1, MET, MLH1*, MSH2*, MSH3, MSH6*, MUTYH*, NBN, NF1*, NF2, NTHL1, PALB2*, PHOX2B, PMS2*, POT1, PRKAR1A, PTCH1, PTEN*, RAD51C*, RAD51D*,RB1, RECQL, RET, SDHA, SDHAF2, SDHB, SDHC, SDHD, SMAD4, SMARCA4, SMARCB1, SMARCE1, STK11, SUFU, TMEM127, TP53*,TSC1, TSC2, VHL and XRCC2 (sequencing and deletion/duplication); EGFR, EGLN1, HOXB13, KIT, MITF, PDGFRA, POLD1 and POLE (sequencing only); EPCAM and GREM1 (deletion/duplication only).     HISTORY OF PRESENTING ILLNESS: Patient is ambulating independently.  Accompanied by her husband.   Marilyn Page 46 y.o.  female right breast TRIPLE POSITIVE Stage Ib  currently on neoadjuvant TCH plus P chemotherapy is here for follow-up/review/Breast  MRI.  Patient currently status post neoadjuvant TCH plus P cycle #2. She reports that overall she is doing really well. She had one nosebleed on the 4th that stopped within a few minutes- believes the claritin dries out her nose- using nasal saline. Foods and liquids taste off but is eating and drinking well. Mouth sores are improved. 2 episodes of diarrhea that resolved with imodium. No nausea/vomiting/fever.   Review of Systems  Constitutional:  Negative for chills, diaphoresis, fever, malaise/fatigue and weight loss.  HENT:  Negative for nosebleeds and sore throat.   Eyes:  Negative for double vision.  Respiratory:  Negative for cough, hemoptysis, sputum production, shortness of breath and wheezing.   Cardiovascular:  Negative for chest pain, palpitations, orthopnea and leg swelling.  Gastrointestinal:  Negative for abdominal pain, blood in stool, constipation, diarrhea, heartburn, melena, nausea and vomiting.  Genitourinary:  Negative for dysuria, frequency and urgency.  Musculoskeletal:  Negative for back pain and joint pain.  Skin: Negative.  Negative for itching and rash.  Neurological:  Negative for dizziness, tingling, focal weakness, weakness and headaches.  Endo/Heme/Allergies:  Does not bruise/bleed easily.  Psychiatric/Behavioral:  Negative for depression. The patient is not nervous/anxious and does not have insomnia.      MEDICAL HISTORY:  Past Medical History:  Diagnosis Date   Carcinoma of upper-outer quadrant of right breast in female, estrogen receptor positive ([place]) 10/26/2021   Diabetes mellitus without complication ([place])    History of gestational diabetes     SURGICAL HISTORY: Past Surgical History:  Procedure Laterality Date   BREAST BIOPSY Right 10/19/2021   [place] Core bx 10:00 6 cmfn Ribbon Clip. Axilla Butterfly hydro clip- path pending   CESAREAN SECTION  2005/2008   DILATION AND CURETTAGE OF UTERUS  2003   PORTACATH PLACEMENT Left 11/09/2021   Procedure:  INSERTION PORT-A-CATH;  Surgeon: Robert Bellow, MD;  Location: [facility] ORS;  Service: General;  Laterality: Left;   SKIN BIOPSY Right 11/09/2021   Procedure: PUNCH BIOPSY SKIN, TATTOO LYMPH NODE RIGHT AXILLA;  Surgeon: Robert Bellow, MD;  Location: [facility] ORS;  Service: General;  Laterality: Right;    SOCIAL HISTORY: Social History   Socioeconomic History   Marital status: Married    Spouse name: Jaci Standard   Number of children: 2   Years of education: Not on file   Highest education level: Not on file  Occupational History   Occupation: and Optometrist  Tobacco Use   Smoking status: Never   Smokeless tobacco: Never  Vaping Use   Vaping Use: Never used  Substance and Sexual Activity   Alcohol use: Never   Drug use: Never   Sexual activity: Yes    Birth control/protection: Other-see comments, Surgical    Comment: vasectomy  Other Topics Concern   Not on file  Social History Narrative   Pre-school teacher; in [place]; 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    Social Determinants of Health   Financial Resource Strain: Not on file  Food Insecurity: Not on file  Transportation Needs: Not on file  Physical Activity: Inactive (09/26/2017)   Exercise Vital Sign    Days of Exercise per Week: 0 days    Minutes of Exercise per Session: 0 min  Stress: Not on file  Social Connections: Not on file  Intimate Partner Violence: Not on file    FAMILY HISTORY: Family History  Problem Relation Age of Onset   Ovarian cysts Mother    Migraines Mother    Melanoma Mother        on leg   Hyperlipidemia Father    Diabetes Maternal Aunt    Stomach cancer Paternal Aunt    Diabetes Maternal Grandfather    Lymphoma Cousin 17   Breast cancer Neg Hx     ALLERGIES:  is allergic to metformin.  MEDICATIONS:  Current Outpatient Medications  Medication Sig Dispense Refill   calcium-vitamin D (OSCAL WITH D) 500-5 MG-MCG tablet Take 1 tablet by mouth 2 (two) times daily. 60 tablet 2   ciprofloxacin  (CIPRO) 500 MG tablet Take 1 tablet (500 mg total) by mouth 2 (two) times daily. 14 tablet 0   dexamethasone (DECADRON) 4 MG tablet One pill twice a day - Start the day prior to chemo; do not take  the day of chemo. 40 tablet 0   ferrous sulfate 325 (65 FE) MG tablet Take 325 mg by mouth 2 (two) times daily with a meal.     lidocaine-prilocaine (EMLA) cream Apply on the port. 30 -45 min  prior to port access. 30 g 3   magic mouthwash w/lidocaine SOLN Take 5 mLs by mouth 4 (four) times daily as needed for mouth pain. Sig: Swish/Swallow 5-10 ml four times a day as needed. Dispense 480 ml. 1RF (Patient not taking: Reported on 12/01/2021) 480 mL 1   norethindrone (MICRONOR) 0.35 MG tablet Take 1 tablet (0.35 mg total) by mouth daily. 84 tablet 2   ondansetron (ZOFRAN-ODT) 8 MG disintegrating tablet Take 1 tablet (8 mg total) by mouth every 8 (eight) hours as needed for nausea or vomiting. 45 tablet 1   ONETOUCH VERIO test strip SMARTSIG:1 Each Via Meter Daily PRN     OZEMPIC, 0.25 OR 0.5 MG/DOSE, 2 MG/1.5ML SOPN PLEASE SEE ATTACHED FOR DETAILED DIRECTIONS     prochlorperazine (COMPAZINE) 10 MG tablet Take 1 tablet (10 mg total) by mouth every 6 (six) hours as needed for nausea or vomiting. 40 tablet 1   No current facility-administered medications for this visit.      Marland Kitchen  PHYSICAL EXAMINATION: ECOG PERFORMANCE STATUS: 0 - Asymptomatic  Vitals:   12/10/21 1347  BP: 124/62  Pulse: 96  Resp: 16  Temp: 98.2 F (36.8 C)  SpO2: 99%    Filed Weights   12/10/21 1347  Weight: 215 lb 8 oz (97.8 kg)    Right breast-10:00 vague 3 to 4 cm mass noted-with skin thickening/ ?  Biopsy changes.  No nipple changes no erythema.   Physical Exam Vitals and nursing note reviewed.  HENT:     Head: Normocephalic and atraumatic.     Mouth/Throat:     Pharynx: Oropharynx is clear.  Eyes:     Extraocular Movements: Extraocular movements intact.     Pupils: Pupils are equal, round, and reactive to light.   Cardiovascular:     Rate and Rhythm: Normal rate and regular rhythm.  Pulmonary:     Comments: Decreased breath sounds bilaterally.  Abdominal:     Palpations: Abdomen is soft.  Musculoskeletal:        General: Normal range of motion.     Cervical back: Normal range of motion.  Skin:    General: Skin is warm.  Neurological:     General: No focal deficit present.     Mental Status: She is alert and oriented to person, place, and time.  Psychiatric:        Behavior: Behavior normal.        Judgment: Judgment normal.     LABORATORY DATA:  I have reviewed the data as listed Lab Results  Component Value Date   WBC 5.0 12/10/2021   HGB 12.0 12/10/2021   HCT 36.1 12/10/2021   MCV 85.1 12/10/2021   PLT 148 (L) 12/10/2021   Recent Labs    11/10/21 1510 11/16/21 1334 11/20/21 1333 11/25/21 0856 12/01/21 1304 12/10/21 1320  NA 141 136   < > 136 135 136  K 3.4* 3.7   < > 3.6 4.3 3.3*  CL 106 101   < > 100 101 100  CO2 28 29   < > $R'29 27 28  'Nh$ GLUCOSE 107* 142*   < > 132* 207* 129*  BUN 11 12   < > $R'9 12 9  'GY$ CREATININE 0.78 0.70   < >  0.85 0.87 0.85  CALCIUM 8.6* 8.3*   < > 8.7* 9.0 8.8*  GFRNONAA >60 >60   < > >60 >60 >60  PROT 7.1 7.0  --   --  7.0  --   ALBUMIN 3.5 3.5  --   --  3.7  --   AST 17 36  --   --  29  --   ALT 17 56*  --   --  41  --   ALKPHOS 54 58  --   --  65  --   BILITOT 0.4 0.4  --   --  0.4  --    < > = values in this interval not displayed.    RADIOGRAPHIC STUDIES: I have personally reviewed the radiological images as listed and agreed with the findings in the report. MR BREAST BILATERAL W WO CONTRAST INC CAD  Result Date: 11/17/2021 CLINICAL DATA:  Newly diagnosed right breast cancer. EXAM: BILATERAL BREAST MRI WITH AND WITHOUT CONTRAST TECHNIQUE: Multiplanar, multisequence MR images of both breasts were obtained prior to and following the intravenous administration of 10 ml of Gadavist Three-dimensional MR images were rendered by post-processing of  the original MR data on an independent workstation. The three-dimensional MR images were interpreted, and findings are reported in the following complete MRI report for this study. Three dimensional images were evaluated at the independent interpreting workstation using the DynaCAD thin client. COMPARISON:  Mammography and ultrasound dated October 01, 2021 FINDINGS: Breast composition: d. Extreme fibroglandular tissue. Background parenchymal enhancement: Moderate. Right breast: The patient's known malignancy is located in the upper outer quadrant of the right breast measuring 3.8 by 2.8 by 3.6 cm in AP, transverse, and craniocaudal dimensions. Numerous other suspicious masses are identified in the right breast. A cluster of 3 linearly arrayed masses are identified in the upper outer right breast on series 6, images 38-41. These masses are located anterior and inferior to the known biopsy proven malignancy. The largest of these 3 masses is best seen on series 6, image 40 measuring 6 mm in size with a mixture of persistent, plateau, and washout kinetics. The largest of these masses is located approximately 2 cm anterior and inferior to the known malignancy. The smallest and most anterior these masses is located 2.5 cm anterior and inferior to the known malignancy. There are multiple a apparent satellite lesions medial and posterior to the known malignancy as identified on series 6, image 52. There is an additional satellite lesion just posterior to the known malignancy on series 6, image 60 measuring 4 mm. There are 2 adjacent masses anterior and medial to the known malignancy as identified on series 6, images 55 and 59. The largest of these 2 masses on image 59 measures 8 mm. These 2 masses demonstrate primarily persistent kinetics with a tiny amount of plateau kinetics. An additional mass is identified superior, anterior, and medial to the known malignancy as identified on series 6, image 67 with persistent kinetics.  This mass measures 5 mm. The distance between the anterior-most and posterior most suspected satellite lesions is 7.5 cm. The transverse dimension between the lateral-most and medial most suspected satellite lesions is 5.3 cm. The distance between the superior most and inferior most satellite lesions is 4.3 cm. Multiple foci of enhancement are identified in the skin. Two adjacent foci are identified on series 6, image 76 in the upper breast. Other potential skin lesions are identified on series 6, images 73, 70, 60, and 50. There is diffuse  skin thickening. Left breast: No mass or abnormal enhancement. Lymph nodes: The biopsied lymph node in the right axilla is identified with cortical thickening and adjacent HydroMARK clip. There is a lymph node just superior and deep to the biopsied node, also abnormal in appearance as identified on series 6, image 74. No other definitive abnormal nodes identified in the right axilla. No abnormal left axillary nodes. There are 2 mildly prominent right internal mammary nodes as seen on series 6, images 66 and 81. These 2 nodes are not FDG avid on the recent PET-CT. Ancillary findings:  None. IMPRESSION: 1. The patient's known malignancy in the upper outer quadrant of the right breast measures 3.8 x 2.8 x 3.6 cm. Numerous other suspicious masses are identified in the right breast as above involving the upper outer and upper inner quadrants, located both anterior and posterior to the known malignancy. The distance between the most anterior and posterior suspected satellite lesions at 7.5 cm. The distance between the most lateral and most medial suspected satellite lesions is 5.3 cm. The distance between the most superior and inferior satellite lesions is 4.3 cm. 2. Numerous enhancing foci in the skin as above are worrisome for skin metastases. 3. Biopsy proven metastatic node in the right axilla. An adjacent node is also abnormal in appearance as above. 4. No evidence of malignancy  in the left breast. 5. Two mildly prominent right internal mammary nodes were not FDG avid on recent PET-CT imaging. Recommend attention on follow-up. RECOMMENDATION: Recommend continued surgical and oncologic follow up. The most superomedial mass in the right breast on series 6, image 67, the most inferior mass in the upper outer right breast on series 6, image 39, and the most posteriorly located masses in the right breast on series 6, image 52 could be biopsied under MRI guidance if it would change clinical management. Additional biopsies may be helpful if breast conservation is being considered. Recommend clinical assessment of the patient's thickened skin given the suspicion for skin metastases. BI-RADS CATEGORY  4: Suspicious. Electronically Signed   By: Dorise Bullion III M.D.   On: 11/17/2021 12:13   ASSESSMENT & PLAN:   No problem-specific Assessment & Plan notes found for this encounter.  Encounter Diagnoses  Name Primary?   Carcinoma of upper-outer quadrant of right breast in female, estrogen receptor positive ([place]) Yes   Dehydration    Weight loss    Epistaxis    Chronic. Tolerating chemo regimen well. Continue rest and hydration. Follow up for Cycle 3 consideration with Dr. B (already scheduled).  Improved with good oral intake. Labs good today- pt prefers to hold off on IVF.  Pt reports that she is trying for this. Will continue to monitor.   Resolved but linked to Claritin. She will continue to use saline nasal spray to help prevent this.    All questions were answered. The patient/family knows to call the clinic with any problems, questions or concerns.       Hughie Closs, PA-C 12/10/2021 2:16 PM

## 2021-12-11 ENCOUNTER — Encounter: Payer: Self-pay | Admitting: Internal Medicine

## 2021-12-14 ENCOUNTER — Encounter: Payer: Self-pay | Admitting: Internal Medicine

## 2021-12-22 ENCOUNTER — Inpatient Hospital Stay: Payer: 59 | Admitting: Internal Medicine

## 2021-12-22 ENCOUNTER — Encounter: Payer: Self-pay | Admitting: Internal Medicine

## 2021-12-22 ENCOUNTER — Inpatient Hospital Stay: Payer: 59

## 2021-12-22 DIAGNOSIS — Z170 Estrogen receptor positive status [ER+]: Secondary | ICD-10-CM

## 2021-12-22 DIAGNOSIS — Z5112 Encounter for antineoplastic immunotherapy: Secondary | ICD-10-CM | POA: Diagnosis not present

## 2021-12-22 DIAGNOSIS — D500 Iron deficiency anemia secondary to blood loss (chronic): Secondary | ICD-10-CM

## 2021-12-22 DIAGNOSIS — C50411 Malignant neoplasm of upper-outer quadrant of right female breast: Secondary | ICD-10-CM | POA: Diagnosis not present

## 2021-12-22 LAB — COMPREHENSIVE METABOLIC PANEL
ALT: 33 U/L (ref 0–44)
AST: 23 U/L (ref 15–41)
Albumin: 3.5 g/dL (ref 3.5–5.0)
Alkaline Phosphatase: 63 U/L (ref 38–126)
Anion gap: 6 (ref 5–15)
BUN: 14 mg/dL (ref 6–20)
CO2: 24 mmol/L (ref 22–32)
Calcium: 9.1 mg/dL (ref 8.9–10.3)
Chloride: 110 mmol/L (ref 98–111)
Creatinine, Ser: 0.83 mg/dL (ref 0.44–1.00)
GFR, Estimated: >60 mL/min (ref >60)
Glucose, Bld: 206 mg/dL — ABNORMAL HIGH (ref 70–99)
Potassium: 4.4 mmol/L (ref 3.5–5.1)
Sodium: 140 mmol/L (ref 135–145)
Total Bilirubin: 0.5 mg/dL (ref 0.3–1.2)
Total Protein: 6.7 g/dL (ref 6.5–8.1)

## 2021-12-22 LAB — CBC WITH DIFFERENTIAL/PLATELET
Abs Immature Granulocytes: 0.05 10*3/uL (ref 0.00–0.07)
Basophils Absolute: 0 10*3/uL (ref 0.0–0.1)
Basophils Relative: 0 %
Eosinophils Absolute: 0 10*3/uL (ref 0.0–0.5)
Eosinophils Relative: 0 %
HCT: 35.1 % — ABNORMAL LOW (ref 36.0–46.0)
Hemoglobin: 11.5 g/dL — ABNORMAL LOW (ref 12.0–15.0)
Immature Granulocytes: 1 %
Lymphocytes Relative: 9 %
Lymphs Abs: 0.8 10*3/uL (ref 0.7–4.0)
MCH: 29 pg (ref 26.0–34.0)
MCHC: 32.8 g/dL (ref 30.0–36.0)
MCV: 88.6 fL (ref 80.0–100.0)
Monocytes Absolute: 0.4 10*3/uL (ref 0.1–1.0)
Monocytes Relative: 4 %
Neutro Abs: 6.9 10*3/uL (ref 1.7–7.7)
Neutrophils Relative %: 86 %
Platelets: 126 10*3/uL — ABNORMAL LOW (ref 150–400)
RBC: 3.96 MIL/uL (ref 3.87–5.11)
RDW: 15.3 % (ref 11.5–15.5)
WBC: 8.1 10*3/uL (ref 4.0–10.5)
nRBC: 0 % (ref 0.0–0.2)

## 2021-12-22 LAB — IRON AND TIBC
Iron: 69 ug/dL (ref 28–170)
Saturation Ratios: 20 % (ref 10.4–31.8)
TIBC: 351 ug/dL (ref 250–450)
UIBC: 282 ug/dL

## 2021-12-22 LAB — FERRITIN: Ferritin: 44 ng/mL (ref 11–307)

## 2021-12-22 MED ORDER — SODIUM CHLORIDE 0.9% FLUSH
10.0000 mL | INTRAVENOUS | Status: DC | PRN
  Filled 2021-12-22: qty 10

## 2021-12-22 MED ORDER — HEPARIN SOD (PORK) LOCK FLUSH 100 UNIT/ML IV SOLN
INTRAVENOUS | Status: AC
  Administered 2021-12-22: 500 [IU]
  Filled 2021-12-22: qty 5

## 2021-12-22 MED ORDER — TRASTUZUMAB-DKST CHEMO 150 MG IV SOLR
6.0000 mg/kg | Freq: Once | INTRAVENOUS | Status: AC
  Administered 2021-12-22: 609 mg via INTRAVENOUS
  Filled 2021-12-22: qty 29

## 2021-12-22 MED ORDER — SODIUM CHLORIDE 0.9 % IV SOLN
420.0000 mg | Freq: Once | INTRAVENOUS | Status: AC
  Administered 2021-12-22: 420 mg via INTRAVENOUS
  Filled 2021-12-22: qty 14

## 2021-12-22 MED ORDER — DIPHENHYDRAMINE HCL 25 MG PO CAPS
50.0000 mg | ORAL_CAPSULE | Freq: Once | ORAL | Status: AC
  Administered 2021-12-22: 50 mg via ORAL
  Filled 2021-12-22: qty 2

## 2021-12-22 MED ORDER — SODIUM CHLORIDE 0.9 % IV SOLN
900.0000 mg | Freq: Once | INTRAVENOUS | Status: AC
  Administered 2021-12-22: 900 mg via INTRAVENOUS
  Filled 2021-12-22: qty 90

## 2021-12-22 MED ORDER — HEPARIN SOD (PORK) LOCK FLUSH 100 UNIT/ML IV SOLN
500.0000 [IU] | Freq: Once | INTRAVENOUS | Status: AC | PRN
  Filled 2021-12-22: qty 5

## 2021-12-22 MED ORDER — ACETAMINOPHEN 325 MG PO TABS
650.0000 mg | ORAL_TABLET | Freq: Once | ORAL | Status: AC
  Administered 2021-12-22: 650 mg via ORAL
  Filled 2021-12-22: qty 2

## 2021-12-22 MED ORDER — SODIUM CHLORIDE 0.9 % IV SOLN
10.0000 mg | Freq: Once | INTRAVENOUS | Status: AC
  Administered 2021-12-22: 10 mg via INTRAVENOUS
  Filled 2021-12-22: qty 10

## 2021-12-22 MED ORDER — PALONOSETRON HCL INJECTION 0.25 MG/5ML
0.2500 mg | Freq: Once | INTRAVENOUS | Status: AC
  Administered 2021-12-22: 0.25 mg via INTRAVENOUS
  Filled 2021-12-22: qty 5

## 2021-12-22 MED ORDER — SODIUM CHLORIDE 0.9 % IV SOLN
Freq: Once | INTRAVENOUS | Status: AC
  Filled 2021-12-22: qty 250

## 2021-12-22 MED ORDER — SODIUM CHLORIDE 0.9 % IV SOLN
75.0000 mg/m2 | Freq: Once | INTRAVENOUS | Status: AC
  Administered 2021-12-22: 160 mg via INTRAVENOUS
  Filled 2021-12-22: qty 16

## 2021-12-22 MED ORDER — SODIUM CHLORIDE 0.9 % IV SOLN
150.0000 mg | Freq: Once | INTRAVENOUS | Status: AC
  Administered 2021-12-22: 150 mg via INTRAVENOUS
  Filled 2021-12-22: qty 150

## 2021-12-22 NOTE — Progress Notes (Signed)
She forgot to take ozempic Saturday, can she take today or hold off?

## 2021-12-22 NOTE — Assessment & Plan Note (Addendum)
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  Discussed with Dr. Sedalia Muta tumor heterogeneity. BREAST MRI- JUNE 2023- The patient's known malignancy in the upper outer quadrant of the right breast measures 3.8 x 2.8 x 3.6 cm. Numerous other suspicious masses are identified in the right breast as above involving the upper outer and upper inner quadrants, located both anterior and posterior to the known malignancy.  Numerous enhancing foci in the skin as above are worrisome for skin metastases ; Bx proven. Biopsy proven metastatic node in the right axilla.  No evidence of malignancy in the left breast;  Two mildly prominent right internal mammary nodes were not FDG avid on recent PET-CT imaging.  Currently on neoadjuvant chemotherapy- TCH plus P x6 cycles;  MUGA scan May 31st, 2023- -61%.    # Proceed with cycle #3 today-  d-2udenyca.  Discussed regarding use of Claritin post growth factor.  Plan diagnostic mammogram/ultrasound post cycle #3 to assess response.  # Borderline diabetes- on Ozempic once a week/fridays; fasting blood glucose-80s to 90s.  This morning PBF-BG- 204; monitor closley on chemo.   # PN G-1- sec to Taxotere- monitor for now.   #Mucositis grade 1 improved with-salt water baking soda rinses; Magic mouthwash.  # DISPOSITION: # proceed with Chemo  today D-2 udenyca as planned.  # follow up with NP in 10 days- labs- cbc/bmp; possible IVFs over 1 hours  # follow up in 3 weeks-MD: labs- cbc/cmp;Taxotere+ Caboplatin+herceptin+Perjeta; D-2 Ellen Henri; Right breast Diagnostic mammogram/US re: response to chemotherapy  Dr.B

## 2021-12-22 NOTE — Progress Notes (Signed)
[facility] [place] NOTE  Patient Care Team: Latanya Maudlin, NP as PCP - General (Family Medicine) Daiva Huge, RN as Oncology Nurse Navigator Cammie Sickle, [name]  CHIEF COMPLAINTS/PURPOSE OF CONSULTATION: Breast cancer  #  Oncology History Overview Note  MAY 2023-    Targeted ultrasound is performed, showing a 2.9 x 1.9 x 2.0 cm irregular hypoechoic mass right breast 10 o'clock position 6 cm from nipple at the site of palpable concern.   There is an abnormal 8 mm thickened right axillary lymph node.   There is skin thickening overlying the right breast. ------------------  A. BREAST, RIGHT AT 10:00, 6 CM FROM THE NIPPLE; ULTRASOUND-GUIDED CORE  NEEDLE BIOPSY:  - INVASIVE MAMMARY CARCINOMA, NO SPECIAL TYPE.   Size of invasive carcinoma: 11 mm in this sample  Histologic grade of invasive carcinoma: Grade 2                       Glandular/tubular differentiation score: 3                       Nuclear pleomorphism score: 2                       Mitotic rate score: 1                       Total score: 6  Ductal carcinoma in situ: Present, intermediate grade  Lymphovascular invasion: Not identified   B. LYMPH NODE, RIGHT AXILLARY; ULTRASOUND-GUIDED CORE NEEDLE BIOPSY:  - 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 IN GREATEST  EXTENT.   Comment:  The malignancy in the primary breast lesion appears morphologically  different from tumor in the lymph node sampling, with tumor present in  lymph node more suggestive of a histologic grade 3, with significantly  increased mitotic activity and pleomorphism. Due to the discrepancy in  these morphologic patterns, ER/PR/HER2 immunohistochemistry will be  performed on both blocks A1 and B1, with reflex to [place] for HER2 2+. The  results will be reported in an addendum.   ADDENDUM:  CASE SUMMARY: BREAST BIOMARKER TESTS - A - BREAST, RIGHT AT 10:00  Estrogen Receptor  (ER) Status: POSITIVE          Percentage of cells with nuclear positivity: 71-80%          Average intensity of staining: Strong   Progesterone Receptor (PgR) Status: POSITIVE          Percentage of cells with nuclear positivity: 81-90%          Average intensity of staining: Strong   HER2 (by immunohistochemistry): POSITIVE (Score 3+)       Percentage of cells with uniform intense complete membrane  staining: 61-70%  HER2 displays a heterogeneous staining pattern, with areas showing a  strong 3+ staining pattern, and other regions with complete absence (0+)  of staining.   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  Discussed with Dr. Sedalia Muta tumor heterogeneity. BREAST MRI- JUNE 2023- The patient's known malignancy in the upper outer quadrant of the right breast measures 3.8 x 2.8 x 3.6 cm. Numerous other suspicious masses are identified in the right breast as above involving the upper outer and upper inner quadrants, located both anterior and posterior to the known malignancy.  Numerous enhancing foci in the skin as above are worrisome for skin metastases ; Bx proven. Biopsy proven metastatic node in the right axilla.  No evidence of malignancy in the left breast;  Two mildly prominent right internal mammary nodes were not FDG avid on recent PET-CT imaging.  Currently on neoadjuvant chemotherapy- TCH  plus P x6 cycles;  MUGA scan May 31st, 2023- -61%.    # June 7th, 2023- NEO-ADJUVANT CHEMO- TCH+P   # Genetic counseling s/p- NEG for any deleterious mutations.     Carcinoma of upper-outer quadrant of right breast in female, estrogen receptor positive ([place])  10/26/2021 Initial Diagnosis   Carcinoma of upper-outer quadrant of right breast in female, estrogen receptor positive ([place])   10/26/2021 Cancer Staging   Staging form: Breast, AJCC 8th Edition - Clinical: Stage IB (cT2, cN1, cM0, G2, ER+, PR+, HER2+) - Signed by Cammie Sickle, MD on 10/26/2021 Histologic grading system: 3 grade system   11/11/2021 -  Chemotherapy   Patient is on Treatment Plan : BREAST  Docetaxel + Carboplatin + Trastuzumab + Pertuzumab  (TCHP) q21d       Genetic Testing   Negative genetic testing. No pathogenic variants identified on the [facility] CancerNext-Expanded+RNA panel. The report date is 11/29/2021.  The CancerNext-Expanded + RNAinsight gene panel offered by [employer] and includes sequencing and rearrangement analysis for the following 77 genes: IP, ALK, APC*, ATM*, AXIN2, BAP1, BARD1, BLM, BMPR1A, BRCA1*, BRCA2*, BRIP1*, CDC73, CDH1*,CDK4, CDKN1B, CDKN2A, CHEK2*, CTNNA1, DICER1, FANCC, FH, FLCN, GALNT12, KIF1B, LZTR1, MAX, MEN1, MET, MLH1*, MSH2*, MSH3, MSH6*, MUTYH*, NBN, NF1*, NF2, NTHL1, PALB2*, PHOX2B, PMS2*, POT1, PRKAR1A, PTCH1, PTEN*, RAD51C*, RAD51D*,RB1, RECQL, RET, SDHA, SDHAF2, SDHB, SDHC, SDHD, SMAD4, SMARCA4, SMARCB1, SMARCE1, STK11, SUFU, TMEM127, TP53*,TSC1, TSC2, VHL and XRCC2 (sequencing and deletion/duplication); EGFR, EGLN1, HOXB13, KIT, MITF, PDGFRA, POLD1 and POLE (sequencing only); EPCAM and GREM1 (deletion/duplication only).     HISTORY OF PRESENTING ILLNESS: Patient is ambulating independently.  Accompanied by her husband.   Marilyn Page 46 y.o.  female right breast TRIPLE POSITIVE with T4- Stage III currently on neoadjuvant TCH plus P chemotherapy is here for  follow-up.  Patient currently status post neoadjuvant TCH plus P cycle #2.   One nose bleed. Also had a nosebleed resolved.  Complained for indigestion- improved on PPI.   Complains of mild tingling and numbness in extremities.  Mild mucositis improved with Magic mouthwash. Complains of fatigue. No skin rash. No diarrhea.   Review of Systems  Constitutional:  Negative for chills, diaphoresis, fever, malaise/fatigue and weight loss.  HENT:  Negative for nosebleeds and sore throat.   Eyes:  Negative for double vision.  Respiratory:  Negative for cough, hemoptysis, sputum production, shortness of breath and wheezing.   Cardiovascular:  Negative for chest pain, palpitations, orthopnea and leg swelling.  Gastrointestinal:  Negative for abdominal pain, blood in stool, constipation, diarrhea, heartburn, melena, nausea and  vomiting.  Genitourinary:  Negative for dysuria, frequency and urgency.  Musculoskeletal:  Negative for back pain and joint pain.  Skin: Negative.  Negative for itching and rash.  Neurological:  Negative for dizziness, tingling, focal weakness, weakness and headaches.  Endo/Heme/Allergies:  Does not bruise/bleed easily.  Psychiatric/Behavioral:  Negative for depression. The patient is not nervous/anxious and does not have insomnia.      MEDICAL HISTORY:  Past Medical History:  Diagnosis Date   Carcinoma of upper-outer quadrant of right breast in female, estrogen receptor positive ([place]) 10/26/2021   Diabetes mellitus without complication ([place])    History of gestational diabetes     SURGICAL HISTORY: Past Surgical History:  Procedure Laterality Date   BREAST BIOPSY Right 10/19/2021   [place] Core bx 10:00 6 cmfn Ribbon Clip. Axilla Butterfly hydro clip- path pending   CESAREAN SECTION  2005/2008   DILATION AND CURETTAGE OF UTERUS  2003   PORTACATH PLACEMENT Left 11/09/2021   Procedure: INSERTION PORT-A-CATH;  Surgeon: Robert Bellow, MD;  Location: [facility] ORS;  Service:  General;  Laterality: Left;   SKIN BIOPSY Right 11/09/2021   Procedure: PUNCH BIOPSY SKIN, TATTOO LYMPH NODE RIGHT AXILLA;  Surgeon: Robert Bellow, MD;  Location: [facility] ORS;  Service: General;  Laterality: Right;    SOCIAL HISTORY: Social History   Socioeconomic History   Marital status: Married    Spouse name: Jaci Standard   Number of children: 2   Years of education: Not on file   Highest education level: Not on file  Occupational History   Occupation: and Optometrist  Tobacco Use   Smoking status: Never   Smokeless tobacco: Never  Vaping Use   Vaping Use: Never used  Substance and Sexual Activity   Alcohol use: Never   Drug use: Never   Sexual activity: Yes    Birth control/protection: Other-see comments, Surgical    Comment: vasectomy  Other Topics Concern   Not on file  Social History Narrative   Pre-school teacher; in [place]; 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    Social Determinants of Health   Financial Resource Strain: Not on file  Food Insecurity: Not on file  Transportation Needs: Not on file  Physical Activity: Inactive (09/26/2017)   Exercise Vital Sign    Days of Exercise per Week: 0 days    Minutes of Exercise per Session: 0 min  Stress: Not on file  Social Connections: Not on file  Intimate Partner Violence: Not on file    FAMILY HISTORY: Family History  Problem Relation Age of Onset   Ovarian cysts Mother    Migraines Mother    Melanoma Mother        on leg   Hyperlipidemia Father    Diabetes Maternal Aunt    Stomach cancer Paternal Aunt    Diabetes Maternal Grandfather    Lymphoma Cousin 17   Breast cancer Neg Hx     ALLERGIES:  is allergic to metformin.  MEDICATIONS:  Current Outpatient Medications  Medication Sig Dispense Refill   calcium-vitamin D (OSCAL WITH D) 500-5 MG-MCG tablet Take 1 tablet by mouth 2 (two) times daily. 60 tablet 2   ciprofloxacin (CIPRO) 500 MG tablet Take 1 tablet (500 mg total) by mouth 2 (two) times daily. 14  tablet 0   dexamethasone (DECADRON) 4 MG tablet One pill twice a day - Start the day prior to chemo; do not take the day of chemo. 40 tablet 0   ferrous sulfate 325 (65 FE) MG  tablet Take 325 mg by mouth 2 (two) times daily with a meal.     lidocaine-prilocaine (EMLA) cream Apply on the port. 30 -45 min  prior to port access. 30 g 3   magic mouthwash w/lidocaine SOLN Take 5 mLs by mouth 4 (four) times daily as needed for mouth pain. Sig: Swish/Swallow 5-10 ml four times a day as needed. Dispense 480 ml. 1RF 480 mL 1   norethindrone (MICRONOR) 0.35 MG tablet Take 1 tablet (0.35 mg total) by mouth daily. 84 tablet 2   ondansetron (ZOFRAN-ODT) 8 MG disintegrating tablet Take 1 tablet (8 mg total) by mouth every 8 (eight) hours as needed for nausea or vomiting. 45 tablet 1   ONETOUCH VERIO test strip SMARTSIG:1 Each Via Meter Daily PRN     OZEMPIC, 0.25 OR 0.5 MG/DOSE, 2 MG/1.5ML SOPN PLEASE SEE ATTACHED FOR DETAILED DIRECTIONS     prochlorperazine (COMPAZINE) 10 MG tablet Take 1 tablet (10 mg total) by mouth every 6 (six) hours as needed for nausea or vomiting. 40 tablet 1   No current facility-administered medications for this visit.   Facility-Administered Medications Ordered in Other Visits  Medication Dose Route Frequency Provider Last Rate Last Admin   CARBOplatin (PARAPLATIN) 900 mg in sodium chloride 0.9 % 500 mL chemo infusion  900 mg Intravenous Once Cammie Sickle, MD       DOCEtaxel (TAXOTERE) 160 mg in sodium chloride 0.9 % 250 mL chemo infusion  75 mg/m2 (Treatment Plan Recorded) Intravenous Once Charlaine Dalton R, MD       heparin lock flush 100 UNIT/ML injection            heparin lock flush 100 unit/mL  500 Units Intracatheter Once PRN Cammie Sickle, MD       pertuzumab (PERJETA) 420 mg in sodium chloride 0.9 % 250 mL chemo infusion  420 mg Intravenous Once Charlaine Dalton R, MD       sodium chloride flush (NS) 0.9 % injection 10 mL  10 mL Intracatheter PRN  Cammie Sickle, MD          .  PHYSICAL EXAMINATION: ECOG PERFORMANCE STATUS: 0 - Asymptomatic  Vitals:   12/22/21 0844  BP: 113/72  Pulse: 86  Temp: 97.9 F (36.6 C)  SpO2: 100%    Filed Weights   12/22/21 0844  Weight: 221 lb (100.2 kg)    Right breast-10:00 vague 3 to 4 cm mass noted-with skin thickening/ ?  Biopsy changes.  No nipple changes no erythema.   Physical Exam Vitals and nursing note reviewed.  HENT:     Head: Normocephalic and atraumatic.     Mouth/Throat:     Pharynx: Oropharynx is clear.  Eyes:     Extraocular Movements: Extraocular movements intact.     Pupils: Pupils are equal, round, and reactive to light.  Cardiovascular:     Rate and Rhythm: Normal rate and regular rhythm.  Pulmonary:     Comments: Decreased breath sounds bilaterally.  Abdominal:     Palpations: Abdomen is soft.  Musculoskeletal:        General: Normal range of motion.     Cervical back: Normal range of motion.  Skin:    General: Skin is warm.  Neurological:     General: No focal deficit present.     Mental Status: She is alert and oriented to person, place, and time.  Psychiatric:        Behavior: Behavior normal.  Judgment: Judgment normal.      LABORATORY DATA:  I have reviewed the data as listed Lab Results  Component Value Date   WBC 8.1 12/22/2021   HGB 11.5 (L) 12/22/2021   HCT 35.1 (L) 12/22/2021   MCV 88.6 12/22/2021   PLT 126 (L) 12/22/2021   Recent Labs    11/16/21 1334 11/20/21 1333 12/01/21 1304 12/10/21 1320 12/22/21 0838  NA 136   < > 135 136 140  K 3.7   < > 4.3 3.3* 4.4  CL 101   < > 101 100 110  CO2 29   < > $R'27 28 24  'al$ GLUCOSE 142*   < > 207* 129* 206*  BUN 12   < > $R'12 9 14  'Uw$ CREATININE 0.70   < > 0.87 0.85 0.83  CALCIUM 8.3*   < > 9.0 8.8* 9.1  GFRNONAA >60   < > >60 >60 >60  PROT 7.0  --  7.0  --  6.7  ALBUMIN 3.5  --  3.7  --  3.5  AST 36  --  29  --  23  ALT 56*  --  41  --  33  ALKPHOS 58  --  65  --  63   BILITOT 0.4  --  0.4  --  0.5   < > = values in this interval not displayed.    RADIOGRAPHIC STUDIES: I have personally reviewed the radiological images as listed and agreed with the findings in the report. No results found.  ASSESSMENT & PLAN:   Carcinoma of upper-outer quadrant of right breast in female, estrogen receptor positive ([place]) # MAY 2023- STAGE III-T4N1 ER/PR positive HER2 POSITIVE right breast cancer;LN-positive-ER/PR positive however 2 NEGATIVE s/p biopsy [see discussion below].  Discussed with Dr. Sedalia Muta tumor heterogeneity. BREAST MRI- JUNE 2023- The patient's known malignancy in the upper outer quadrant of the right breast measures 3.8 x 2.8 x 3.6 cm. Numerous other suspicious masses are identified in the right breast as above involving the upper outer and upper inner quadrants, located both anterior and posterior to the known malignancy.  Numerous enhancing foci in the skin as above are worrisome for skin metastases ; Bx proven. Biopsy proven metastatic node in the right axilla.  No evidence of malignancy in the left breast;  Two mildly prominent right internal mammary nodes were not FDG avid on recent PET-CT imaging.  Currently on neoadjuvant chemotherapy- TCH plus P x6 cycles;  MUGA scan May 31st, 2023- -61%.    # Proceed with cycle #3 today-  d-2udenyca.  Discussed regarding use of Claritin post growth factor.  Plan diagnostic mammogram/ultrasound post cycle #3 to assess response.  # Borderline diabetes- on Ozempic once a week/fridays; fasting blood glucose-80s to 90s.  This morning PBF-BG- 204; monitor closley on chemo.   # PN G-1- sec to Taxotere- monitor for now.   #Mucositis grade 1 improved with-salt water baking soda rinses; Magic mouthwash.  # DISPOSITION: # proceed with Chemo  today D-2 udenyca as planned.  # follow up with NP in 10 days- labs- cbc/bmp; possible IVFs over 1 hours  # follow up in 3 weeks-MD: labs- cbc/cmp;Taxotere+  Caboplatin+herceptin+Perjeta; D-2 Ellen Henri; Right breast Diagnostic mammogram/US re: response to chemotherapy  Dr.B    All questions were answered. The patient/family knows to call the clinic with any problems, questions or concerns.       Cammie Sickle, MD 12/22/2021 11:26 AM

## 2021-12-22 NOTE — Patient Instructions (Signed)
[facility] [place]  Discharge Instructions: Thank you for choosing [facility] to provide your oncology and hematology care.  If you have a lab appointment with the [facility], please go directly to the [facility] and check in at the registration area.  Wear comfortable clothing and clothing appropriate for easy access to any Portacath or PICC line.   We strive to give you quality time with your provider. You may need to reschedule your appointment if you arrive late (15 or more minutes).  Arriving late affects you and other patients whose appointments are after yours.  Also, if you miss three or more appointments without notifying the office, you may be dismissed from the clinic at the provider's discretion.      For prescription refill requests, have your pharmacy contact our office and allow 72 hours for refills to be completed.    Today you received the following chemotherapy and/or immunotherapy agents taxotere, carboplatin, herceptin, perjeta      To help prevent nausea and vomiting after your treatment, we encourage you to take your nausea medication as directed.  BELOW ARE SYMPTOMS THAT SHOULD BE REPORTED IMMEDIATELY: *FEVER GREATER THAN 100.4 F (38 C) OR HIGHER *CHILLS OR SWEATING *NAUSEA AND VOMITING THAT IS NOT CONTROLLED WITH YOUR NAUSEA MEDICATION *UNUSUAL SHORTNESS OF BREATH *UNUSUAL BRUISING OR BLEEDING *URINARY PROBLEMS (pain or burning when urinating, or frequent urination) *BOWEL PROBLEMS (unusual diarrhea, constipation, pain near the anus) TENDERNESS IN MOUTH AND THROAT WITH OR WITHOUT PRESENCE OF ULCERS (sore throat, sores in mouth, or a toothache) UNUSUAL RASH, SWELLING OR PAIN  UNUSUAL VAGINAL DISCHARGE OR ITCHING   Items with * indicate a potential emergency and should be followed up as soon as possible or go to the Emergency Department if any problems should occur.  Please show the CHEMOTHERAPY ALERT CARD or  IMMUNOTHERAPY ALERT CARD at check-in to the Emergency Department and triage nurse.  Should you have questions after your visit or need to cancel or reschedule your appointment, please contact [facility] [place]  [phone] and follow the prompts.  Office hours are 8:00 a.m. to 4:30 p.m. Monday - Friday. Please note that voicemails left after 4:00 p.m. may not be returned until the following business day.  We are closed weekends and major holidays. You have access to a nurse at all times for urgent questions. Please call the main number to the clinic [phone] and follow the prompts.  For any non-urgent questions, you may also contact your provider using MyChart. We now offer e-Visits for anyone 69 and older to request care online for non-urgent symptoms. For details visit [email or web address].   Also download the MyChart app! Go to the app store, search "MyChart", open the app, select [facility], and log in with your MyChart username and password.  Masks are optional in the cancer centers. If you would like for your care team to wear a mask while they are taking care of you, please let them know. For doctor visits, patients may have with them one support person who is at least 47 years old. At this time, visitors are not allowed in the infusion area.  Trastuzumab injection for infusion What is this medication? TRASTUZUMAB (tras TOO zoo mab) is a monoclonal antibody. It is used to treat breast cancer and stomach cancer. This medicine may be used for other purposes; ask your health care provider or pharmacist if you have questions. COMMON BRAND NAME(S): Herceptin, Belenda Cruise, Ogivri,  Chalmers Guest What should I tell my care team before I take this medication? They need to know if you have any of these conditions: heart disease heart failure lung or breathing disease, like asthma an unusual or allergic reaction to trastuzumab, benzyl alcohol, or other  medications, foods, dyes, or preservatives pregnant or trying to get pregnant breast-feeding How should I use this medication? This drug is given as an infusion into a vein. It is administered in a hospital or clinic by a specially trained health care professional. Talk to your pediatrician regarding the use of this medicine in children. This medicine is not approved for use in children. Overdosage: If you think you have taken too much of this medicine contact a poison control center or emergency room at once. NOTE: This medicine is only for you. Do not share this medicine with others. What if I miss a dose? It is important not to miss a dose. Call your doctor or health care professional if you are unable to keep an appointment. What may interact with this medication? This medicine may interact with the following medications: certain types of chemotherapy, such as daunorubicin, doxorubicin, epirubicin, and idarubicin This list may not describe all possible interactions. Give your health care provider a list of all the medicines, herbs, non-prescription drugs, or dietary supplements you use. Also tell them if you smoke, drink alcohol, or use illegal drugs. Some items may interact with your medicine. What should I watch for while using this medication? Visit your doctor for checks on your progress. Report any side effects. Continue your course of treatment even though you feel ill unless your doctor tells you to stop. Call your doctor or health care professional for advice if you get a fever, chills or sore throat, or other symptoms of a cold or flu. Do not treat yourself. Try to avoid being around people who are sick. You may experience fever, chills and shaking during your first infusion. These effects are usually mild and can be treated with other medicines. Report any side effects during the infusion to your health care professional. Fever and chills usually do not happen with later infusions. Do  not become pregnant while taking this medicine or for 7 months after stopping it. Women should inform their doctor if they wish to become pregnant or think they might be pregnant. Women of child-bearing potential will need to have a negative pregnancy test before starting this medicine. There is a potential for serious side effects to an unborn child. Talk to your health care professional or pharmacist for more information. Do not breast-feed an infant while taking this medicine or for 7 months after stopping it. Women must use effective birth control with this medicine. What side effects may I notice from receiving this medication? Side effects that you should report to your doctor or health care professional as soon as possible: allergic reactions like skin rash, itching or hives, swelling of the face, lips, or tongue chest pain or palpitations cough dizziness feeling faint or lightheaded, falls fever general ill feeling or flu-like symptoms signs of worsening heart failure like breathing problems; swelling in your legs and feet unusually weak or tired Side effects that usually do not require medical attention (report to your doctor or health care professional if they continue or are bothersome): bone pain changes in taste diarrhea joint pain nausea/vomiting weight loss This list may not describe all possible side effects. Call your doctor for medical advice about side effects. You may report side  effects to FDA at 1-800-FDA-1088. Where should I keep my medication? This drug is given in a hospital or clinic and will not be stored at home. NOTE: This sheet is a summary. It may not cover all possible information. If you have questions about this medicine, talk to your doctor, pharmacist, or health care provider.  2023 Elsevier/Gold Standard (2016-06-08 00:00:00)  Pertuzumab injection What is this medication? PERTUZUMAB (per TOOZ ue mab) is a monoclonal antibody. It is used to treat  breast cancer. This medicine may be used for other purposes; ask your health care provider or pharmacist if you have questions. COMMON BRAND NAME(S): PERJETA What should I tell my care team before I take this medication? They need to know if you have any of these conditions: heart disease heart failure high blood pressure history of irregular heart beat recent or ongoing radiation therapy an unusual or allergic reaction to pertuzumab, other medicines, foods, dyes, or preservatives pregnant or trying to get pregnant breast-feeding How should I use this medication? This medicine is for infusion into a vein. It is given by a health care professional in a hospital or clinic setting. Talk to your pediatrician regarding the use of this medicine in children. Special care may be needed. Overdosage: If you think you have taken too much of this medicine contact a poison control center or emergency room at once. NOTE: This medicine is only for you. Do not share this medicine with others. What if I miss a dose? It is important not to miss your dose. Call your doctor or health care professional if you are unable to keep an appointment. What may interact with this medication? Interactions are not expected. Give your health care provider a list of all the medicines, herbs, non-prescription drugs, or dietary supplements you use. Also tell them if you smoke, drink alcohol, or use illegal drugs. Some items may interact with your medicine. This list may not describe all possible interactions. Give your health care provider a list of all the medicines, herbs, non-prescription drugs, or dietary supplements you use. Also tell them if you smoke, drink alcohol, or use illegal drugs. Some items may interact with your medicine. What should I watch for while using this medication? Your condition will be monitored carefully while you are receiving this medicine. Report any side effects. Continue your course of  treatment even though you feel ill unless your doctor tells you to stop. Do not become pregnant while taking this medicine or for 7 months after stopping it. Women should inform their doctor if they wish to become pregnant or think they might be pregnant. Women of child-bearing potential will need to have a negative pregnancy test before starting this medicine. There is a potential for serious side effects to an unborn child. Talk to your health care professional or pharmacist for more information. Do not breast-feed an infant while taking this medicine or for 7 months after stopping it. Women must use effective birth control with this medicine. Call your doctor or health care professional for advice if you get a fever, chills or sore throat, or other symptoms of a cold or flu. Do not treat yourself. Try to avoid being around people who are sick. You may experience fever, chills, and headache during the infusion. Report any side effects during the infusion to your health care professional. What side effects may I notice from receiving this medication? Side effects that you should report to your doctor or health care professional as soon as possible:  breathing problems chest pain or palpitations dizziness feeling faint or lightheaded fever or chills skin rash, itching or hives sore throat swelling of the face, lips, or tongue swelling of the legs or ankles unusually weak or tired Side effects that usually do not require medical attention (report to your doctor or health care professional if they continue or are bothersome): diarrhea hair loss nausea, vomiting tiredness This list may not describe all possible side effects. Call your doctor for medical advice about side effects. You may report side effects to FDA at 1-800-FDA-1088. Where should I keep my medication? This drug is given in a hospital or clinic and will not be stored at home. NOTE: This sheet is a summary. It may not cover all  possible information. If you have questions about this medicine, talk to your doctor, pharmacist, or health care provider.  2023 Elsevier/Gold Standard (2015-06-26 00:00:00)  Docetaxel injection What is this medication? DOCETAXEL (doe se TAX el) is a chemotherapy drug. It targets fast dividing cells, like cancer cells, and causes these cells to die. This medicine is used to treat many types of cancers like breast cancer, certain stomach cancers, head and neck cancer, lung cancer, and prostate cancer. This medicine may be used for other purposes; ask your health care provider or pharmacist if you have questions. COMMON BRAND NAME(S): Docefrez, Taxotere What should I tell my care team before I take this medication? They need to know if you have any of these conditions: infection (especially a virus infection such as chickenpox, cold sores, or herpes) liver disease low blood counts, like low white cell, platelet, or red cell counts an unusual or allergic reaction to docetaxel, polysorbate 80, other chemotherapy agents, other medicines, foods, dyes, or preservatives pregnant or trying to get pregnant breast-feeding How should I use this medication? This drug is given as an infusion into a vein. It is administered in a hospital or clinic by a specially trained health care professional. Talk to your pediatrician regarding the use of this medicine in children. Special care may be needed. Overdosage: If you think you have taken too much of this medicine contact a poison control center or emergency room at once. NOTE: This medicine is only for you. Do not share this medicine with others. What if I miss a dose? It is important not to miss your dose. Call your doctor or health care professional if you are unable to keep an appointment. What may interact with this medication? Do not take this medicine with any of the following medications: live virus vaccines This medicine may also interact with the  following medications: aprepitant certain antibiotics like erythromycin or clarithromycin certain antivirals for HIV or hepatitis certain medicines for fungal infections like fluconazole, itraconazole, ketoconazole, posaconazole, or voriconazole cimetidine ciprofloxacin conivaptan cyclosporine dronedarone fluvoxamine grapefruit juice imatinib verapamil This list may not describe all possible interactions. Give your health care provider a list of all the medicines, herbs, non-prescription drugs, or dietary supplements you use. Also tell them if you smoke, drink alcohol, or use illegal drugs. Some items may interact with your medicine. What should I watch for while using this medication? Your condition will be monitored carefully while you are receiving this medicine. You will need important blood work done while you are taking this medicine. Call your doctor or health care professional for advice if you get a fever, chills or sore throat, or other symptoms of a cold or flu. Do not treat yourself. This drug decreases your body's ability to  fight infections. Try to avoid being around people who are sick. Some products may contain alcohol. Ask your health care professional if this medicine contains alcohol. Be sure to tell all health care professionals you are taking this medicine. Certain medicines, like metronidazole and disulfiram, can cause an unpleasant reaction when taken with alcohol. The reaction includes flushing, headache, nausea, vomiting, sweating, and increased thirst. The reaction can last from 30 minutes to several hours. You may get drowsy or dizzy. Do not drive, use machinery, or do anything that needs mental alertness until you know how this medicine affects you. Do not stand or sit up quickly, especially if you are an older patient. This reduces the risk of dizzy or fainting spells. Alcohol may interfere with the effect of this medicine. Talk to your health care professional about  your risk of cancer. You may be more at risk for certain types of cancer if you take this medicine. Do not become pregnant while taking this medicine or for 6 months after stopping it. Women should inform their doctor if they wish to become pregnant or think they might be pregnant. There is a potential for serious side effects to an unborn child. Talk to your health care professional or pharmacist for more information. Do not breast-feed an infant while taking this medicine or for 1 week after stopping it. Males who get this medicine must use a condom during sex with females who can get pregnant. If you get a woman pregnant, the baby could have birth defects. The baby could die before they are born. You will need to continue wearing a condom for 3 months after stopping the medicine. Tell your health care provider right away if your partner becomes pregnant while you are taking this medicine. This may interfere with the ability to father a child. You should talk to your doctor or health care professional if you are concerned about your fertility. What side effects may I notice from receiving this medication? Side effects that you should report to your doctor or health care professional as soon as possible: allergic reactions like skin rash, itching or hives, swelling of the face, lips, or tongue blurred vision breathing problems changes in vision low blood counts - This drug may decrease the number of white blood cells, red blood cells and platelets. You may be at increased risk for infections and bleeding. nausea and vomiting pain, redness or irritation at site where injected pain, tingling, numbness in the hands or feet redness, blistering, peeling, or loosening of the skin, including inside the mouth signs of decreased platelets or bleeding - bruising, pinpoint red spots on the skin, black, tarry stools, nosebleeds signs of decreased red blood cells - unusually weak or tired, fainting spells,  lightheadedness signs of infection - fever or chills, cough, sore throat, pain or difficulty passing urine swelling of the ankle, feet, hands Side effects that usually do not require medical attention (report to your doctor or health care professional if they continue or are bothersome): constipation diarrhea fingernail or toenail changes hair loss loss of appetite mouth sores muscle pain This list may not describe all possible side effects. Call your doctor for medical advice about side effects. You may report side effects to FDA at 1-800-FDA-1088. Where should I keep my medication? This drug is given in a hospital or clinic and will not be stored at home. NOTE: This sheet is a summary. It may not cover all possible information. If you have questions about this medicine, talk  to your doctor, pharmacist, or health care provider.  2023 Elsevier/Gold Standard (2021-04-24 00:00:00)  Carboplatin injection What is this medication? CARBOPLATIN (KAR boe pla tin) is a chemotherapy drug. It targets fast dividing cells, like cancer cells, and causes these cells to die. This medicine is used to treat ovarian cancer and many other cancers. This medicine may be used for other purposes; ask your health care provider or pharmacist if you have questions. COMMON BRAND NAME(S): Paraplatin What should I tell my care team before I take this medication? They need to know if you have any of these conditions: blood disorders hearing problems kidney disease recent or ongoing radiation therapy an unusual or allergic reaction to carboplatin, cisplatin, other chemotherapy, other medicines, foods, dyes, or preservatives pregnant or trying to get pregnant breast-feeding How should I use this medication? This drug is usually given as an infusion into a vein. It is administered in a hospital or clinic by a specially trained health care professional. Talk to your pediatrician regarding the use of this medicine in  children. Special care may be needed. Overdosage: If you think you have taken too much of this medicine contact a poison control center or emergency room at once. NOTE: This medicine is only for you. Do not share this medicine with others. What if I miss a dose? It is important not to miss a dose. Call your doctor or health care professional if you are unable to keep an appointment. What may interact with this medication? medicines for seizures medicines to increase blood counts like filgrastim, pegfilgrastim, sargramostim some antibiotics like amikacin, gentamicin, neomycin, streptomycin, tobramycin vaccines Talk to your doctor or health care professional before taking any of these medicines: acetaminophen aspirin ibuprofen ketoprofen naproxen This list may not describe all possible interactions. Give your health care provider a list of all the medicines, herbs, non-prescription drugs, or dietary supplements you use. Also tell them if you smoke, drink alcohol, or use illegal drugs. Some items may interact with your medicine. What should I watch for while using this medication? Your condition will be monitored carefully while you are receiving this medicine. You will need important blood work done while you are taking this medicine. This drug may make you feel generally unwell. This is not uncommon, as chemotherapy can affect healthy cells as well as cancer cells. Report any side effects. Continue your course of treatment even though you feel ill unless your doctor tells you to stop. In some cases, you may be given additional medicines to help with side effects. Follow all directions for their use. Call your doctor or health care professional for advice if you get a fever, chills or sore throat, or other symptoms of a cold or flu. Do not treat yourself. This drug decreases your body's ability to fight infections. Try to avoid being around people who are sick. This medicine may increase your  risk to bruise or bleed. Call your doctor or health care professional if you notice any unusual bleeding. Be careful brushing and flossing your teeth or using a toothpick because you may get an infection or bleed more easily. If you have any dental work done, tell your dentist you are receiving this medicine. Avoid taking products that contain aspirin, acetaminophen, ibuprofen, naproxen, or ketoprofen unless instructed by your doctor. These medicines may hide a fever. Do not become pregnant while taking this medicine. Women should inform their doctor if they wish to become pregnant or think they might be pregnant. There is a potential  for serious side effects to an unborn child. Talk to your health care professional or pharmacist for more information. Do not breast-feed an infant while taking this medicine. What side effects may I notice from receiving this medication? Side effects that you should report to your doctor or health care professional as soon as possible: allergic reactions like skin rash, itching or hives, swelling of the face, lips, or tongue signs of infection - fever or chills, cough, sore throat, pain or difficulty passing urine signs of decreased platelets or bleeding - bruising, pinpoint red spots on the skin, black, tarry stools, nosebleeds signs of decreased red blood cells - unusually weak or tired, fainting spells, lightheadedness breathing problems changes in hearing changes in vision chest pain high blood pressure low blood counts - This drug may decrease the number of white blood cells, red blood cells and platelets. You may be at increased risk for infections and bleeding. nausea and vomiting pain, swelling, redness or irritation at the injection site pain, tingling, numbness in the hands or feet problems with balance, talking, walking trouble passing urine or change in the amount of urine Side effects that usually do not require medical attention (report to your  doctor or health care professional if they continue or are bothersome): hair loss loss of appetite metallic taste in the mouth or changes in taste This list may not describe all possible side effects. Call your doctor for medical advice about side effects. You may report side effects to FDA at 1-800-FDA-1088. Where should I keep my medication? This drug is given in a hospital or clinic and will not be stored at home. NOTE: This sheet is a summary. It may not cover all possible information. If you have questions about this medicine, talk to your doctor, pharmacist, or health care provider.  2023 Elsevier/Gold Standard (2007-11-01 00:00:00)

## 2021-12-23 ENCOUNTER — Inpatient Hospital Stay: Payer: 59

## 2021-12-23 DIAGNOSIS — C50411 Malignant neoplasm of upper-outer quadrant of right female breast: Secondary | ICD-10-CM

## 2021-12-23 DIAGNOSIS — Z5112 Encounter for antineoplastic immunotherapy: Secondary | ICD-10-CM | POA: Diagnosis not present

## 2021-12-23 MED ORDER — PEGFILGRASTIM-BMEZ 6 MG/0.6ML ~~LOC~~ SOSY
6.0000 mg | PREFILLED_SYRINGE | Freq: Once | SUBCUTANEOUS | Status: AC
  Administered 2021-12-23: 6 mg via SUBCUTANEOUS
  Filled 2021-12-23: qty 0.6

## 2021-12-27 ENCOUNTER — Other Ambulatory Visit: Payer: Self-pay

## 2021-12-27 ENCOUNTER — Emergency Department: Payer: 59

## 2021-12-27 ENCOUNTER — Inpatient Hospital Stay
Admission: EM | Admit: 2021-12-27 | Discharge: 2021-12-30 | DRG: 871 | Disposition: A | Discharge status: Home / Self Care | Payer: 59 | Attending: Osteopathic Medicine | Admitting: Osteopathic Medicine

## 2021-12-27 DIAGNOSIS — C50411 Malignant neoplasm of upper-outer quadrant of right female breast: Secondary | ICD-10-CM | POA: Diagnosis present

## 2021-12-27 DIAGNOSIS — E1169 Type 2 diabetes mellitus with other specified complication: Secondary | ICD-10-CM | POA: Diagnosis not present

## 2021-12-27 DIAGNOSIS — E119 Type 2 diabetes mellitus without complications: Secondary | ICD-10-CM | POA: Diagnosis present

## 2021-12-27 DIAGNOSIS — T451X5A Adverse effect of antineoplastic and immunosuppressive drugs, initial encounter: Secondary | ICD-10-CM | POA: Diagnosis present

## 2021-12-27 DIAGNOSIS — U071 COVID-19: Secondary | ICD-10-CM

## 2021-12-27 DIAGNOSIS — Z807 Family history of other malignant neoplasms of lymphoid, hematopoietic and related tissues: Secondary | ICD-10-CM | POA: Diagnosis not present

## 2021-12-27 DIAGNOSIS — D709 Neutropenia, unspecified: Secondary | ICD-10-CM | POA: Diagnosis present

## 2021-12-27 DIAGNOSIS — Z8349 Family history of other endocrine, nutritional and metabolic diseases: Secondary | ICD-10-CM | POA: Diagnosis not present

## 2021-12-27 DIAGNOSIS — R059 Cough, unspecified: Secondary | ICD-10-CM | POA: Diagnosis present

## 2021-12-27 DIAGNOSIS — Z800 Family history of malignant neoplasm of digestive organs: Secondary | ICD-10-CM | POA: Diagnosis not present

## 2021-12-27 DIAGNOSIS — D696 Thrombocytopenia, unspecified: Secondary | ICD-10-CM | POA: Diagnosis not present

## 2021-12-27 DIAGNOSIS — Z170 Estrogen receptor positive status [ER+]: Secondary | ICD-10-CM

## 2021-12-27 DIAGNOSIS — R5081 Fever presenting with conditions classified elsewhere: Secondary | ICD-10-CM | POA: Diagnosis present

## 2021-12-27 DIAGNOSIS — Z79899 Other long term (current) drug therapy: Secondary | ICD-10-CM | POA: Diagnosis not present

## 2021-12-27 DIAGNOSIS — A419 Sepsis, unspecified organism: Secondary | ICD-10-CM

## 2021-12-27 DIAGNOSIS — A4189 Other specified sepsis: Principal | ICD-10-CM | POA: Diagnosis present

## 2021-12-27 DIAGNOSIS — Z808 Family history of malignant neoplasm of other organs or systems: Secondary | ICD-10-CM | POA: Diagnosis not present

## 2021-12-27 DIAGNOSIS — D6959 Other secondary thrombocytopenia: Secondary | ICD-10-CM | POA: Diagnosis present

## 2021-12-27 DIAGNOSIS — Z833 Family history of diabetes mellitus: Secondary | ICD-10-CM

## 2021-12-27 LAB — BASIC METABOLIC PANEL
Anion gap: 8 (ref 5–15)
BUN: 16 mg/dL (ref 6–20)
CO2: 22 mmol/L (ref 22–32)
Calcium: 8.6 mg/dL — ABNORMAL LOW (ref 8.9–10.3)
Chloride: 104 mmol/L (ref 98–111)
Creatinine, Ser: 0.93 mg/dL (ref 0.44–1.00)
GFR, Estimated: >60 mL/min (ref >60)
Glucose, Bld: 192 mg/dL — ABNORMAL HIGH (ref 70–99)
Potassium: 3.6 mmol/L (ref 3.5–5.1)
Sodium: 134 mmol/L — ABNORMAL LOW (ref 135–145)

## 2021-12-27 LAB — URINALYSIS, ROUTINE W REFLEX MICROSCOPIC
Bacteria, UA: NONE SEEN
Bilirubin Urine: NEGATIVE
Glucose, UA: NEGATIVE mg/dL
Hgb urine dipstick: NEGATIVE
Ketones, ur: NEGATIVE mg/dL
Nitrite: NEGATIVE
Protein, ur: NEGATIVE mg/dL
Specific Gravity, Urine: 1.031 — ABNORMAL HIGH (ref 1.005–1.030)
pH: 6 (ref 5.0–8.0)

## 2021-12-27 LAB — HEPATIC FUNCTION PANEL
ALT: 51 U/L — ABNORMAL HIGH (ref 0–44)
AST: 42 U/L — ABNORMAL HIGH (ref 15–41)
Albumin: 3.7 g/dL (ref 3.5–5.0)
Alkaline Phosphatase: 70 U/L (ref 38–126)
Bilirubin, Direct: 0.2 mg/dL (ref 0.0–0.2)
Indirect Bilirubin: 0.9 mg/dL (ref 0.3–0.9)
Total Bilirubin: 1.1 mg/dL (ref 0.3–1.2)
Total Protein: 7 g/dL (ref 6.5–8.1)

## 2021-12-27 LAB — SARS CORONAVIRUS 2 BY RT PCR: SARS Coronavirus 2 by RT PCR: POSITIVE — AB

## 2021-12-27 LAB — TROPONIN I (HIGH SENSITIVITY): Troponin I (High Sensitivity): 4 ng/L (ref <18)

## 2021-12-27 LAB — LACTIC ACID, PLASMA: Lactic Acid, Venous: 2.1 mmol/L (ref 0.5–1.9)

## 2021-12-27 MED ORDER — LACTATED RINGERS IV BOLUS
2000.0000 mL | Freq: Once | INTRAVENOUS | Status: AC
  Administered 2021-12-27: 2000 mL via INTRAVENOUS

## 2021-12-27 MED ORDER — SODIUM CHLORIDE 0.9 % IV SOLN
2.0000 g | Freq: Once | INTRAVENOUS | Status: AC
  Administered 2021-12-27: 2 g via INTRAVENOUS
  Filled 2021-12-27: qty 12.5

## 2021-12-27 MED ORDER — ACETAMINOPHEN 500 MG PO TABS
1000.0000 mg | ORAL_TABLET | Freq: Once | ORAL | Status: AC
  Administered 2021-12-28: 1000 mg via ORAL
  Filled 2021-12-27: qty 2

## 2021-12-27 MED ORDER — IOHEXOL 300 MG/ML  SOLN
100.0000 mL | Freq: Once | INTRAMUSCULAR | Status: AC | PRN
  Administered 2021-12-27: 100 mL via INTRAVENOUS

## 2021-12-27 MED ORDER — LACTATED RINGERS IV BOLUS
500.0000 mL | Freq: Once | INTRAVENOUS | Status: AC
  Administered 2021-12-28: 500 mL via INTRAVENOUS

## 2021-12-27 MED ORDER — ACETAMINOPHEN 500 MG PO TABS
500.0000 mg | ORAL_TABLET | Freq: Once | ORAL | Status: AC
  Administered 2021-12-27: 500 mg via ORAL
  Filled 2021-12-27: qty 1

## 2021-12-27 MED ORDER — VANCOMYCIN HCL 2000 MG/400ML IV SOLN
2000.0000 mg | Freq: Once | INTRAVENOUS | Status: AC
  Administered 2021-12-28: 2000 mg via INTRAVENOUS
  Filled 2021-12-27: qty 400

## 2021-12-27 NOTE — ED Notes (Signed)
Pt knows need for ua . 

## 2021-12-27 NOTE — ED Notes (Signed)
Received report from [facility].

## 2021-12-27 NOTE — Progress Notes (Signed)
CODE SEPSIS - PHARMACY COMMUNICATION  **Broad Spectrum Antibiotics should be administered within 1 hour of Sepsis diagnosis**  Time Code Sepsis Called/Page Received: 2323  Antibiotics Ordered: cefepime, vanc  Time of 1st antibiotic administration: 2220  Additional action taken by pharmacy:    If necessary, Name of Provider/Nurse Contacted:      Noralee Space ,PharmD Clinical Pharmacist  12/27/2021  11:24 PM

## 2021-12-27 NOTE — ED Triage Notes (Signed)
Patient is currently on chemotherapy, reports cough, chest pain, shortness of breath and fever that began this afternoon. AOX4. Patient with noted shortness of breath while talking in triage.

## 2021-12-27 NOTE — ED Provider Triage Note (Signed)
Emergency Medicine Provider Triage Evaluation Note  Marilyn Page , a 46 y.o. female  was evaluated in triage.  Pt complains of cough, fever.  Cough and fever x2 days.  History of cancer undergoing chemo.  101 fever today, took 500 mg of Tylenol.  She has some sharp chest pain only when she coughs.  Husband recently with similar cold symptoms Review of Systems  Positive: Fever, cough Negative: Shortness of breath  Physical Exam  There were no vitals taken for this visit. Gen:   Awake, no distress   Resp:  Normal effort  MSK:   Moves extremities without difficulty  Other:    Medical Decision Making  Medically screening exam initiated at 9:01 PM.  Appropriate orders placed.  Marilyn Page was informed that the remainder of the evaluation will be completed by another provider, this initial triage assessment does not replace that evaluation, and the importance of remaining in the ED until their evaluation is complete.     Marilyn Page, [place] 12/27/21 2103

## 2021-12-27 NOTE — Sepsis Progress Note (Signed)
Elink following code sepsis °

## 2021-12-27 NOTE — ED Provider Notes (Signed)
[facility] Provider Note    Event Date/Time   First MD Initiated Contact with Patient 12/27/21 2119     (approximate)   History   Fever, Chest Pain, Cough, and Shortness of Breath   HPI  Marilyn Page is a 46 y.o. female past medical history of breast cancer on chemotherapy, diabetes who presents with cough fever shortness of breath.  Patient has been feeling sick since yesterday.  Had temp of 102.9.  She has had nonproductive cough, mild shortness of breath with talking no chest pain.  She endorses some generalized abdominal pain feels constipated.  Had straining to have a bowel movement this morning and had some blood in the toilet and with wiping thinks it was not mixed in with the stool.  Denies nausea vomiting.  Her husband has also been sick with a cough over the last several days.  No rashes.  Last chemo was on Tuesday.    Past Medical History:  Diagnosis Date   Carcinoma of upper-outer quadrant of right breast in female, estrogen receptor positive ([place]) 10/26/2021   Diabetes mellitus without complication ([place])    History of gestational diabetes     Patient Active Problem List   Diagnosis Date Noted   Genetic testing 12/01/2021   Carcinoma of upper-outer quadrant of right breast in female, estrogen receptor positive ([place]) 10/26/2021   ASCUS of cervix with negative high risk HPV 04/23/2021   Iron deficiency anemia due to chronic blood loss 04/23/2021   Menorrhagia with regular cycle 10/14/2017   BMI 37.0-37.9, adult 10/03/2017   History of gestational diabetes 10/03/2017     Physical Exam  Triage Vital Signs: ED Triage Vitals  Enc Vitals Group     BP 12/27/21 2101 107/72     Pulse Rate 12/27/21 2101 (!) 145     Resp 12/27/21 2101 (!) 28     Temp 12/27/21 2101 100.1 F (37.8 C)     Temp Source 12/27/21 2101 Oral     SpO2 12/27/21 2101 98 %     Weight 12/27/21 2102 220 lb (99.8 kg)     Height 12/27/21 2102 '5\' 5"'$  (1.651 m)     Head  Circumference --      Peak Flow --      Pain Score 12/27/21 2102 0     Pain Loc --      Pain Edu? --      Excl. in [place]? --     Most recent vital signs: Vitals:   12/27/21 2230 12/27/21 2319  BP: 113/78   Pulse: (!) 121 (!) 110  Resp:    Temp:    SpO2: 98%      General: Awake, chronically ill-appearing nontoxic CV:  Good peripheral perfusion.  No peripheral edema Resp:  Normal effort.  Lungs are clear no increased work of breathing Abd:  No distention.  Abdomen is soft Neuro:             Awake, Alert, Oriented x 3  Other:  Dry mucous membranes, no oral ulcers External rectal exam with some dried blood, no fissure, no external hemorrhoids, internal rectal exam deferred given neutropenia   ED Results / Procedures / Treatments  Labs (all labs ordered are listed, but only abnormal results are displayed) Labs Reviewed  CBC WITH DIFFERENTIAL/PLATELET - Abnormal; Notable for the following components:      Result Value   WBC 0.7 (*)    Hemoglobin 11.4 (*)    HCT  33.9 (*)    Platelets 90 (*)    Neutro Abs 0.3 (*)    Lymphs Abs 0.2 (*)    Monocytes Absolute 0.0 (*)    All other components within normal limits  BASIC METABOLIC PANEL - Abnormal; Notable for the following components:   Sodium 134 (*)    Glucose, Bld 192 (*)    Calcium 8.6 (*)    All other components within normal limits  LACTIC ACID, PLASMA - Abnormal; Notable for the following components:   Lactic Acid, Venous 2.1 (*)    All other components within normal limits  SARS CORONAVIRUS 2 BY RT PCR  CULTURE, BLOOD (ROUTINE X 2)  CULTURE, BLOOD (ROUTINE X 2)  URINE CULTURE  RESPIRATORY PANEL BY PCR  LACTIC ACID, PLASMA  PROTIME-INR  APTT  URINALYSIS, ROUTINE W REFLEX MICROSCOPIC  HEPATIC FUNCTION PANEL  TROPONIN I (HIGH SENSITIVITY)  TROPONIN I (HIGH SENSITIVITY)     EKG  EKG reviewed and interpreted by myself sinus tachycardia normal axis normal interval no acute ischemic changes   RADIOLOGY I  reviewed and interpreted the CXR which does not show any acute cardiopulmonary process    PROCEDURES:  Critical Care performed: Yes, see critical care procedure note(s)  .1-3 Lead EKG Interpretation  Performed by: Rada Hay, MD Authorized by: Rada Hay, MD     Interpretation: abnormal     ECG rate assessment: tachycardic     Rhythm: sinus tachycardia     Ectopy: none     Conduction: normal     The patient is on the cardiac monitor to evaluate for evidence of arrhythmia and/or significant heart rate changes.   MEDICATIONS ORDERED IN ED: Medications  vancomycin (VANCOREADY) IVPB 2000 mg/400 mL (has no administration in time range)  acetaminophen (TYLENOL) tablet 1,000 mg (has no administration in time range)  lactated ringers bolus 500 mL (has no administration in time range)  acetaminophen (TYLENOL) tablet 500 mg (500 mg Oral Given 12/27/21 2220)  ceFEPIme (MAXIPIME) 2 g in sodium chloride 0.9 % 100 mL IVPB (2 g Intravenous New Bag/Given 12/27/21 2220)  lactated ringers bolus 2,000 mL (2,000 mLs Intravenous New Bag/Given 12/27/21 2220)  iohexol (OMNIPAQUE) 300 MG/ML solution 100 mL (100 mLs Intravenous Contrast Given 12/27/21 2246)     IMPRESSION / MDM / ASSESSMENT AND PLAN / ED COURSE  I reviewed the triage vital signs and the nursing notes.                              Patient's presentation is most consistent with acute presentation with potential threat to life or bodily function.  Differential diagnosis includes, but is not limited to, febrile neutropenia, bacteremia, central line infection, pneumonia, viral illness, typhlitis, colitis  Patient is a 46 year old female history of breast cancer on chemotherapy last chemo this Tuesday presents with fever.  1 of 2.9 at home she is febrile to 100.1, tachycardic to 140s blood pressure soft she is tachypneic.  Patient does not look toxic but does look chronically ill lungs are clear she does look dry abdomen is  benign.  Patient complaining of primary respiratory symptoms and just feeling very weak with nonproductive cough but does complain of some abdominal pain as well had an episode of bloody stool but this was in the setting of significant straining with constipation.  Will cover broadly as patient's white count is 0.7 suspect she is significantly neutropenic.  Given she has  an indwelling line will order cefepime and bank.  We will order 2 L fluid bolus will obtain CT abdomen pelvis.  CT abdomen pelvis is normal.  Patient is significantly neutropenic neutrophils 300.  Lactate 2.1 troponin is negative.  CT abdomen pelvis does not have any acute process.  UA still pending.  Will admit to the hospitalist service.       FINAL CLINICAL IMPRESSION(S) / ED DIAGNOSES   Final diagnoses:  Febrile neutropenia ([place])     Rx / DC Orders   ED Discharge Orders     None        Note:  This document was prepared using Dragon voice recognition software and may include unintentional dictation errors.   Rada Hay, MD 12/27/21 [phone]

## 2021-12-28 ENCOUNTER — Other Ambulatory Visit: Payer: Self-pay

## 2021-12-28 ENCOUNTER — Inpatient Hospital Stay: Payer: 59

## 2021-12-28 DIAGNOSIS — E119 Type 2 diabetes mellitus without complications: Secondary | ICD-10-CM

## 2021-12-28 DIAGNOSIS — Z170 Estrogen receptor positive status [ER+]: Secondary | ICD-10-CM

## 2021-12-28 DIAGNOSIS — D696 Thrombocytopenia, unspecified: Secondary | ICD-10-CM

## 2021-12-28 DIAGNOSIS — A419 Sepsis, unspecified organism: Secondary | ICD-10-CM

## 2021-12-28 DIAGNOSIS — C50411 Malignant neoplasm of upper-outer quadrant of right female breast: Secondary | ICD-10-CM

## 2021-12-28 DIAGNOSIS — U071 COVID-19: Secondary | ICD-10-CM

## 2021-12-28 DIAGNOSIS — R5081 Fever presenting with conditions classified elsewhere: Secondary | ICD-10-CM

## 2021-12-28 DIAGNOSIS — D709 Neutropenia, unspecified: Secondary | ICD-10-CM | POA: Diagnosis not present

## 2021-12-28 LAB — CBC WITH DIFFERENTIAL/PLATELET
Abs Immature Granulocytes: 0.04 10*3/uL (ref 0.00–0.07)
Basophils Absolute: 0 10*3/uL (ref 0.0–0.1)
Basophils Relative: 3 %
Eosinophils Absolute: 0 10*3/uL (ref 0.0–0.5)
Eosinophils Relative: 4 %
HCT: 33.9 % — ABNORMAL LOW (ref 36.0–46.0)
Hemoglobin: 11.4 g/dL — ABNORMAL LOW (ref 12.0–15.0)
Immature Granulocytes: 6 %
Lymphocytes Relative: 32 %
Lymphs Abs: 0.2 10*3/uL — ABNORMAL LOW (ref 0.7–4.0)
MCH: 28.7 pg (ref 26.0–34.0)
MCHC: 33.6 g/dL (ref 30.0–36.0)
MCV: 85.4 fL (ref 80.0–100.0)
Monocytes Absolute: 0 10*3/uL — ABNORMAL LOW (ref 0.1–1.0)
Monocytes Relative: 4 %
Neutro Abs: 0.3 10*3/uL — CL (ref 1.7–7.7)
Neutrophils Relative %: 51 %
Platelets: 90 10*3/uL — ABNORMAL LOW (ref 150–400)
RBC: 3.97 MIL/uL (ref 3.87–5.11)
RDW: 15.4 % (ref 11.5–15.5)
Smear Review: NORMAL
WBC: 0.7 10*3/uL — CL (ref 4.0–10.5)
nRBC: 0 % (ref 0.0–0.2)

## 2021-12-28 LAB — GLUCOSE, CAPILLARY
Glucose-Capillary: 149 mg/dL — ABNORMAL HIGH (ref 70–99)
Glucose-Capillary: 116 mg/dL — ABNORMAL HIGH (ref 70–99)

## 2021-12-28 LAB — PROTIME-INR
INR: 1.2 (ref 0.8–1.2)
Prothrombin Time: 15.5 seconds — ABNORMAL HIGH (ref 11.4–15.2)

## 2021-12-28 LAB — BASIC METABOLIC PANEL
Anion gap: 7 (ref 5–15)
BUN: 13 mg/dL (ref 6–20)
CO2: 24 mmol/L (ref 22–32)
Calcium: 8.1 mg/dL — ABNORMAL LOW (ref 8.9–10.3)
Chloride: 106 mmol/L (ref 98–111)
Creatinine, Ser: 0.76 mg/dL (ref 0.44–1.00)
GFR, Estimated: >60 mL/min (ref >60)
Glucose, Bld: 111 mg/dL — ABNORMAL HIGH (ref 70–99)
Potassium: 3.3 mmol/L — ABNORMAL LOW (ref 3.5–5.1)
Sodium: 137 mmol/L (ref 135–145)

## 2021-12-28 LAB — RESPIRATORY PANEL BY PCR

## 2021-12-28 LAB — HEMOGLOBIN A1C
Hgb A1c MFr Bld: 6 % — ABNORMAL HIGH (ref 4.8–5.6)
Mean Plasma Glucose: 125.5 mg/dL

## 2021-12-28 LAB — IRON AND TIBC
Iron: 21 ug/dL — ABNORMAL LOW (ref 28–170)
Saturation Ratios: 8 % — ABNORMAL LOW (ref 10.4–31.8)
TIBC: 276 ug/dL (ref 250–450)
UIBC: 255 ug/dL

## 2021-12-28 LAB — CBG MONITORING, ED
Glucose-Capillary: 110 mg/dL — ABNORMAL HIGH (ref 70–99)
Glucose-Capillary: 111 mg/dL — ABNORMAL HIGH (ref 70–99)
Glucose-Capillary: 108 mg/dL — ABNORMAL HIGH (ref 70–99)

## 2021-12-28 LAB — MAGNESIUM: Magnesium: 1.8 mg/dL (ref 1.7–2.4)

## 2021-12-28 LAB — LACTIC ACID, PLASMA: Lactic Acid, Venous: 1.1 mmol/L (ref 0.5–1.9)

## 2021-12-28 LAB — RETICULOCYTES
Immature Retic Fract: 1.4 % — ABNORMAL LOW (ref 2.3–15.9)
RBC.: 3.28 MIL/uL — ABNORMAL LOW (ref 3.87–5.11)
Retic Count, Absolute: 16.1 10*3/uL — ABNORMAL LOW (ref 19.0–186.0)
Retic Ct Pct: 0.5 % (ref 0.4–3.1)

## 2021-12-28 LAB — CBC
HCT: 28.6 % — ABNORMAL LOW (ref 36.0–46.0)
Hemoglobin: 9.5 g/dL — ABNORMAL LOW (ref 12.0–15.0)
MCH: 28.9 pg (ref 26.0–34.0)
MCHC: 33.2 g/dL (ref 30.0–36.0)
MCV: 86.9 fL (ref 80.0–100.0)
Platelets: 74 10*3/uL — ABNORMAL LOW (ref 150–400)
RBC: 3.29 MIL/uL — ABNORMAL LOW (ref 3.87–5.11)
RDW: 15.4 % (ref 11.5–15.5)
WBC: 0.7 10*3/uL — CL (ref 4.0–10.5)
nRBC: 0 % (ref 0.0–0.2)

## 2021-12-28 LAB — C-REACTIVE PROTEIN: CRP: 3.5 mg/dL — ABNORMAL HIGH (ref <1.0)

## 2021-12-28 LAB — LACTATE DEHYDROGENASE: LDH: 143 U/L (ref 98–192)

## 2021-12-28 LAB — FOLATE: Folate: 38 ng/mL (ref >5.9)

## 2021-12-28 LAB — FERRITIN: Ferritin: 441 ng/mL — ABNORMAL HIGH (ref 11–307)

## 2021-12-28 LAB — HIV ANTIBODY (ROUTINE TESTING W REFLEX): HIV Screen 4th Generation wRfx: NONREACTIVE

## 2021-12-28 LAB — APTT: aPTT: 32 seconds (ref 24–36)

## 2021-12-28 LAB — FIBRINOGEN: Fibrinogen: 406 mg/dL (ref 210–475)

## 2021-12-28 LAB — VITAMIN B12: Vitamin B-12: 960 pg/mL — ABNORMAL HIGH (ref 180–914)

## 2021-12-28 LAB — D-DIMER, QUANTITATIVE: D-Dimer, Quant: 0.58 ug/mL-FEU — ABNORMAL HIGH (ref 0.00–0.50)

## 2021-12-28 LAB — TROPONIN I (HIGH SENSITIVITY): Troponin I (High Sensitivity): 6 ng/L

## 2021-12-28 LAB — MRSA NEXT GEN BY PCR, NASAL: MRSA by PCR Next Gen: NOT DETECTED

## 2021-12-28 LAB — PROCALCITONIN: Procalcitonin: <0.1 ng/mL

## 2021-12-28 MED ORDER — SODIUM CHLORIDE 0.9 % IV SOLN
2.0000 g | Freq: Three times a day (TID) | INTRAVENOUS | Status: DC
  Administered 2021-12-28 – 2021-12-30 (×7): 2 g via INTRAVENOUS
  Filled 2021-12-28 (×8): qty 12.5

## 2021-12-28 MED ORDER — INSULIN ASPART 100 UNIT/ML IJ SOLN
0.0000 [IU] | Freq: Three times a day (TID) | INTRAMUSCULAR | Status: DC
  Filled 2021-12-28 (×2): qty 1

## 2021-12-28 MED ORDER — FERROUS SULFATE 325 (65 FE) MG PO TABS
325.0000 mg | ORAL_TABLET | Freq: Two times a day (BID) | ORAL | Status: DC
  Administered 2021-12-28 – 2021-12-30 (×4): 325 mg via ORAL
  Filled 2021-12-28 (×4): qty 1

## 2021-12-28 MED ORDER — IPRATROPIUM-ALBUTEROL 20-100 MCG/ACT IN AERS
1.0000 | INHALATION_SPRAY | Freq: Four times a day (QID) | RESPIRATORY_TRACT | Status: DC
  Administered 2021-12-28 – 2021-12-30 (×7): 1 via RESPIRATORY_TRACT
  Filled 2021-12-28 (×2): qty 4

## 2021-12-28 MED ORDER — SODIUM CHLORIDE 0.9 % IV SOLN
200.0000 mg | Freq: Once | INTRAVENOUS | Status: AC
  Administered 2021-12-28: 200 mg via INTRAVENOUS
  Filled 2021-12-28: qty 40

## 2021-12-28 MED ORDER — INSULIN ASPART 100 UNIT/ML IJ SOLN: 0.0000 [IU] | Freq: Every day | INTRAMUSCULAR | Status: DC

## 2021-12-28 MED ORDER — ALBUTEROL SULFATE HFA 108 (90 BASE) MCG/ACT IN AERS
2.0000 | INHALATION_SPRAY | Freq: Four times a day (QID) | RESPIRATORY_TRACT | Status: DC
  Administered 2021-12-28 (×2): 2 via RESPIRATORY_TRACT
  Filled 2021-12-28: qty 6.7

## 2021-12-28 MED ORDER — ONDANSETRON HCL 4 MG PO TABS: 4.0000 mg | ORAL_TABLET | Freq: Four times a day (QID) | ORAL | Status: DC | PRN

## 2021-12-28 MED ORDER — MAGIC MOUTHWASH W/LIDOCAINE
5.0000 mL | Freq: Four times a day (QID) | ORAL | Status: DC | PRN
Start: 2021-12-28 — End: 2021-12-30

## 2021-12-28 MED ORDER — ONDANSETRON HCL 4 MG/2ML IJ SOLN: 4.0000 mg | Freq: Four times a day (QID) | INTRAMUSCULAR | Status: DC | PRN

## 2021-12-28 MED ORDER — VANCOMYCIN HCL 750 MG/150ML IV SOLN
750.0000 mg | Freq: Two times a day (BID) | INTRAVENOUS | Status: DC
  Administered 2021-12-28 – 2021-12-29 (×2): 750 mg via INTRAVENOUS
  Filled 2021-12-28 (×4): qty 150

## 2021-12-28 MED ORDER — IOHEXOL 350 MG/ML SOLN
75.0000 mL | Freq: Once | INTRAVENOUS | Status: AC | PRN
  Administered 2021-12-28: 75 mL via INTRAVENOUS

## 2021-12-28 MED ORDER — ZINC SULFATE 220 (50 ZN) MG PO CAPS
220.0000 mg | ORAL_CAPSULE | Freq: Every day | ORAL | Status: DC
Start: 2021-12-28 — End: 2021-12-30
  Administered 2021-12-28 – 2021-12-30 (×3): 220 mg via ORAL
  Filled 2021-12-28 (×3): qty 1

## 2021-12-28 MED ORDER — NORETHINDRONE 0.35 MG PO TABS: 1.0000 | ORAL_TABLET | Freq: Every day | ORAL | Status: DC

## 2021-12-28 MED ORDER — METRONIDAZOLE 500 MG/100ML IV SOLN
500.0000 mg | Freq: Two times a day (BID) | INTRAVENOUS | Status: DC
  Administered 2021-12-28 – 2021-12-29 (×3): 500 mg via INTRAVENOUS
  Filled 2021-12-28 (×4): qty 100

## 2021-12-28 MED ORDER — METOPROLOL TARTRATE 5 MG/5ML IV SOLN: 5.0000 mg | INTRAVENOUS | Status: DC | PRN

## 2021-12-28 MED ORDER — ASCORBIC ACID 500 MG PO TABS
500.0000 mg | ORAL_TABLET | Freq: Every day | ORAL | Status: DC
  Administered 2021-12-28 – 2021-12-30 (×3): 500 mg via ORAL
  Filled 2021-12-28 (×3): qty 1

## 2021-12-28 MED ORDER — ACETAMINOPHEN 650 MG RE SUPP: 650.0000 mg | Freq: Four times a day (QID) | RECTAL | Status: DC | PRN

## 2021-12-28 MED ORDER — LACTATED RINGERS IV SOLN: INTRAVENOUS | Status: AC

## 2021-12-28 MED ORDER — ACETAMINOPHEN 325 MG PO TABS
650.0000 mg | ORAL_TABLET | Freq: Four times a day (QID) | ORAL | Status: DC | PRN
  Administered 2021-12-28 (×2): 650 mg via ORAL
  Filled 2021-12-28 (×2): qty 2

## 2021-12-28 MED ORDER — SODIUM CHLORIDE 0.9 % IV SOLN
100.0000 mg | Freq: Every day | INTRAVENOUS | Status: AC
  Administered 2021-12-29 – 2021-12-30 (×2): 100 mg via INTRAVENOUS
  Filled 2021-12-28 (×2): qty 20

## 2021-12-28 MED ORDER — SENNOSIDES-DOCUSATE SODIUM 8.6-50 MG PO TABS
1.0000 | ORAL_TABLET | Freq: Two times a day (BID) | ORAL | Status: DC
  Administered 2021-12-28: 1 via ORAL
  Filled 2021-12-28 (×4): qty 1

## 2021-12-28 MED ORDER — HYDROCOD POLI-CHLORPHE POLI ER 10-8 MG/5ML PO SUER
5.0000 mL | Freq: Two times a day (BID) | ORAL | Status: DC | PRN
  Administered 2021-12-28: 5 mL via ORAL
  Filled 2021-12-28 (×2): qty 5

## 2021-12-28 MED ORDER — GUAIFENESIN-DM 100-10 MG/5ML PO SYRP
10.0000 mL | ORAL_SOLUTION | ORAL | Status: DC | PRN
  Administered 2021-12-28 – 2021-12-30 (×5): 10 mL via ORAL
  Filled 2021-12-28 (×5): qty 10

## 2021-12-28 MED ORDER — MAGNESIUM SULFATE 2 GM/50ML IV SOLN
2.0000 g | Freq: Once | INTRAVENOUS | Status: AC
  Administered 2021-12-28: 2 g via INTRAVENOUS
  Filled 2021-12-28: qty 50

## 2021-12-28 MED ORDER — GUAIFENESIN ER 600 MG PO TB12
1200.0000 mg | ORAL_TABLET | Freq: Two times a day (BID) | ORAL | Status: DC
Start: 2021-12-28 — End: 2021-12-30
  Administered 2021-12-28 – 2021-12-30 (×5): 1200 mg via ORAL
  Filled 2021-12-28 (×5): qty 2

## 2021-12-28 MED ORDER — POLYETHYLENE GLYCOL 3350 17 G PO PACK
17.0000 g | PACK | Freq: Every day | ORAL | Status: DC
  Administered 2021-12-28: 17 g via ORAL
  Filled 2021-12-28 (×2): qty 1

## 2021-12-28 MED ORDER — ALUM & MAG HYDROXIDE-SIMETH 200-200-20 MG/5ML PO SUSP
30.0000 mL | ORAL | Status: DC | PRN
  Administered 2021-12-28: 30 mL via ORAL
  Filled 2021-12-28: qty 30

## 2021-12-28 MED ORDER — HYDROCOD POLI-CHLORPHE POLI ER 10-8 MG/5ML PO SUER
5.0000 mL | Freq: Two times a day (BID) | ORAL | Status: DC | PRN
  Administered 2021-12-30: 5 mL via ORAL

## 2021-12-28 MED ORDER — POTASSIUM CHLORIDE ER 10 MEQ PO TBCR
40.0000 meq | EXTENDED_RELEASE_TABLET | Freq: Once | ORAL | Status: AC
  Administered 2021-12-28: 40 meq via ORAL
  Filled 2021-12-28: qty 4

## 2021-12-28 MED ORDER — OYSTER SHELL CALCIUM/D3 500-5 MG-MCG PO TABS
1.0000 | ORAL_TABLET | Freq: Two times a day (BID) | ORAL | Status: DC
  Administered 2021-12-28 – 2021-12-30 (×5): 1 via ORAL
  Filled 2021-12-28 (×5): qty 1

## 2021-12-28 MED ORDER — SODIUM CHLORIDE 0.9 % IV SOLN: INTRAVENOUS | Status: DC

## 2021-12-28 NOTE — Progress Notes (Signed)
Pharmacy Antibiotic Note  Marilyn Page is a 46 y.o. female admitted on 12/27/2021 with sepsis/?febrile neutopenia. Pharmacy has been consulted for vancomycin and cefepime dosing.  -covid + -Breast cancer-last chemo Tues 7/18, ANC 0.2  Plan: -Cefepime 2 gm IV q8h  -pt received Vancomycin 2000 mg IV x 1 in ED Will continue with Vancomycin 750 mg IV Q 12 hrs. Goal AUC 400-550. Expected AUC: 498 SCr used: 0.93 Cmin 15.1 Vd= 0.5   BMI > 30      Height: '5\' 5"'$  (165.1 cm) Weight: 99.8 kg (220 lb) IBW/kg (Calculated) : 57  Temp (24hrs), Avg:100.3 F (37.9 C), Min:100.1 F (37.8 C), Max:100.4 F (38 C)  Recent Labs  Lab 12/22/21 0838 12/27/21 2105 12/27/21 2118  WBC 8.1 0.7*  --   CREATININE 0.83 0.93  --   LATICACIDVEN  --   --  2.1*    Estimated Creatinine Clearance: 87.5 mL/min (by C-G formula based on SCr of 0.93 mg/dL).    Allergies  Allergen Reactions   Metformin Other (See Comments)    Antimicrobials this admission: cefepime 7/23 >>   Vancomycin  7/24 >>    Dose adjustments this admission:    Microbiology results: 7/23 BCx:   7/23 UCx: pend    Sputum:      MRSA PCR:   7/23 covid +  Thank you for allowing pharmacy to be a part of this patient's care.  Tanya Marvin A 12/28/2021 2:08 AM

## 2021-12-28 NOTE — Assessment & Plan Note (Addendum)
Most likely d/t COVID19 Complicated by neutropenia d/t cancer treatment - Patient met criteria for sepsis on admission with fever, tachycardia, tachypnea, neutropenia, COVID-19 PCR positive.   -Source is likely respiratory and related to COVID infection though chest x-ray is clear. -Respiratory viral panel negative. - abx deescalated today -Patient received boluses of IV fluids on presentation. -Place on normal saline 125 cc an hour --> plan po intake once able/imroved. -Treat COVID as outlined below

## 2021-12-28 NOTE — Assessment & Plan Note (Signed)
Chemotherapy induced Platelets 90,000 SCD for DVT prophylaxis Continue to monitor

## 2021-12-28 NOTE — Assessment & Plan Note (Addendum)
Complicated by immunocompromise secondary to history of breast cancer currently on chemotherapy.   -Inflammatory markers ordered this morning with a LDH of 143, CRP of 3.5, procalcitonin negative, ferritin of 441 D-dimer elevated at 0.58.  -CT angiogram chest done negative. -Patient currently not hypoxic and as such we will hold off on IV steroids at this time. -Continue Remdesivir, antitussives, Combivent Airborne precautions.

## 2021-12-28 NOTE — Assessment & Plan Note (Addendum)
Hemoglobin A1c 6.0 (12/27/2021 ). SSI.

## 2021-12-28 NOTE — Hospital Course (Signed)
Patient 46 year old female history of breast cancer on chemotherapy, history of gestational diabetes presented to the ED 1 day history of nonproductive cough, shortness of breath, fever as high as 102.9.  Has been noted to have had a similar cough recently.  Patient did endorse some constipation and seeing blood mixed in stool when having bowel movement.  No nausea vomiting or dysuria.  Patient seen in the ED noted to have a temperature of 100.4, tachycardic with heart rate as high as 145, tachypneic with respiratory rate of 28 with sats of 98% on room air.  Blood pressure noted at 107/72.  Patient noted to be profoundly neutropenic with a white count of 0.7, lactic acid of 2.1, COVID-19 PCR positive.  Thrombocytopenia with a platelet kind of 90, hemoglobin of 11.4.  Slight transaminitis.  Respiratory viral panel negative.  Chest x-ray with no active disease.  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  Patient pancultured, placed on broad-spectrum antibiotics of IV vancomycin, cefepime, Flagyl.  Patient also started on remdesivir.  Oncology consulted

## 2021-12-28 NOTE — Progress Notes (Signed)
PROGRESS NOTE    Marilyn Page  ZOX:[id number] DOB: [birth year] DOA: 12/27/2021 PCP: Latanya Maudlin, NP    Chief Complaint  Patient presents with   Fever   Chest Pain   Cough   Shortness of Breath    Brief Narrative:  Patient 46 year old female history of breast cancer on chemotherapy, history of gestational diabetes presented to the ED 1 day history of nonproductive cough, shortness of breath, fever as high as 102.9.  Has been noted to have had a similar cough recently.  Patient did endorse some constipation and seeing blood mixed in stool when having bowel movement.  No nausea vomiting or dysuria.  Patient seen in the ED noted to have a temperature of 100.4, tachycardic with heart rate as high as 145, tachypneic with respiratory rate of 28 with sats of 98% on room air.  Blood pressure noted at 107/72.  Patient noted to be profoundly neutropenic with a white count of 0.7, lactic acid of 2.1, COVID-19 PCR positive.  Thrombocytopenia with a platelet kind of 90, hemoglobin of 11.4.  Slight transaminitis.  Respiratory viral panel negative.  Chest x-ray with no active disease.  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  Patient pancultured, placed on broad-spectrum antibiotics of IV vancomycin, cefepime, Flagyl.  Patient also started on remdesivir.  Oncology consulted    Assessment & Plan:  Principal Problem:   Febrile neutropenia ([place]) Active Problems:   Sepsis ([place])   COVID-19 virus infection   Carcinoma of upper-outer quadrant of right breast in female, estrogen receptor positive ([place])   Thrombocytopenia ([place])   Diabetes mellitus ([place])    Assessment and Plan: * Febrile  neutropenia ([place]) -Suspect respiratory source given symptoms, COVID-positive, tachypnea, tachycardia. -Patient not hypoxic however becomes short of breath when speaking. -Inflammatory markers obtained. -Patient pancultured. -Continue empiric IV vancomycin, IV cefepime, IV Flagyl, IV remdesivir. -Due to elevated D-dimer, shortness of breath when talking, CT angiogram chest done was negative for PE. -Oncology consulted and I appreciate the input and recommendations. -Repeat labs in the AM. Neutropenic precautions  Sepsis ([place]) - Patient met criteria for sepsis on admission with fever, tachycardia, tachypnea, neutropenia, COVID-19 PCR positive.   -Source is likely respiratory and related to COVID infection though chest x-ray is clear. -Respiratory viral panel negative. Continue broad-spectrum antibiotics for sepsis pending further work-up.   -Patient received boluses of IV fluids on presentation. -Place on normal saline 125 cc an hour. Treat COVID as outlined below   COVID-19 virus infection -Patient presented with respiratory symptoms, immunocompromise secondary to history of breast cancer currently on chemotherapy.   -Inflammatory markers ordered this morning with a LDH of 143, CRP of 3.5, procalcitonin negative, ferritin of 441 D-dimer elevated at 0.58.  -CT angiogram chest done negative. -Patient currently not hypoxic and as such we will hold off on IV steroids at this time. -Continue Remdesivir, antitussives, Combivent Airborne precautions.  Diabetes mellitus ([place]) - Hemoglobin A1c 6.0 (12/27/2021 ). -CBG 111 this morning.   -SSI.  Thrombocytopenia ([place]) Chemotherapy induced Platelets 90,000 SCD for DVT prophylaxis Continue to monitor  Carcinoma of upper-outer quadrant of right breast in female, estrogen receptor positive ([place]) Patient is on chemotherapy And last treatment was Tuesday, July 18 -Oncology consulted.  DVT prophylaxis: SCDs Code Status:  Full Family Communication: Updated patient and husband at bedside. Disposition: Home when clinically improved, afebrile x24 to 48 hours, improvement with neutropenia, cleared by hematology.  Status is: Inpatient Remains inpatient appropriate because: Severity of illness   Consultants:  Hematology/oncology: Dr. Tasia Catchings 12/28/2021  Procedures:  Chest x-ray 12/27/2021 CT angiogram chest 12/28/2021 CT abdomen and pelvis 12/27/2021   Antimicrobials:  IV cefepime 12/27/2021>>> IV Flagyl 12/28/2021>>>> IV vancomycin 12/28/2021>>>> IV remdesivir 12/28/2021>>>> 12/31/2021    Subjective: Sitting up in bed.  Husband at bedside.  Denies any chest pain.  Had abdominal pain which she states has improved.  Still with some shortness of breath.  Still with nonproductive cough.  Per husband as patient keeps talking she gets very winded and significantly short of breath.  Patient denies any dysuria.  No diarrhea.  Objective: Vitals:   12/28/21 1415 12/28/21 1452 12/28/21 1514 12/28/21 1735  BP: 124/70 (!) 112/57 (!) 114/54 118/63  Pulse: (!) 125 (!) 113 (!) 111 (!) 107  Resp: _0 Temp: (!) 102.5 F (39.2 C) (!) 100.6 F (38.1 C) (!) 100.9 F (38.3 C) 99.1 F (37.3 C)  TempSrc: Oral Oral Oral Oral  SpO2: 100% 99% 99% 100%  Weight:      Height:        Intake/Output Summary (Last 24 hours) at 12/28/2021 1817 Last data filed at 12/28/2021 1635 Gross per 24 hour  Intake 2050 ml  Output --  Net 2050 ml   Filed Weights   12/27/21 2102  Weight: 99.8 kg    Examination:  General exam: Appears calm and comfortable  Respiratory system: Clear to auscultation. Respiratory effort normal. Cardiovascular system: Tachycardia.  No murmurs rubs or gallops.  No JVD.  No lower extremity edema.  Gastrointestinal system: Abdomen is nondistended, soft and nontender. No organomegaly or masses felt. Normal bowel sounds heard. Central nervous system: Alert and oriented. No focal neurological  deficits. Extremities: Symmetric 5 x 5 power. Skin: No rashes, lesions or ulcers Psychiatry: Judgement and insight appear normal. Mood & affect appropriate.     Data Reviewed:   CBC: Recent Labs  Lab 12/22/21 0838 12/27/21 2105 12/28/21 0329  WBC 8.1 0.7* 0.7*  NEUTROABS 6.9 0.3*  --   HGB 11.5* 11.4* 9.5*  HCT 35.1* 33.9* 28.6*  MCV 88.6 85.4 86.9  PLT 126* 90* 74*    Basic Metabolic Panel: Recent Labs  Lab 12/22/21 0838 12/27/21 2105 12/28/21 0329 12/28/21 0947  NA 140 134* 137  --   K 4.4 3.6 3.3*  --   CL 110 104 106  --   CO2 _1 --   GLUCOSE 206* 192* 111*  --   BUN _2 --   CREATININE 0.83 0.93 0.76  --   CALCIUM 9.1 8.6* 8.1*  --   MG  --   --   --  1.8    GFR: Estimated Creatinine Clearance: 101.7 mL/min (by C-G formula based on SCr of 0.76 mg/dL).  Liver Function Tests: Recent Labs  Lab 12/22/21 0838 12/27/21 2105  AST 23 42*  ALT 33 51*  ALKPHOS 63 70  BILITOT 0.5 1.1  PROT 6.7 7.0  ALBUMIN 3.5 3.7    CBG: Recent Labs  Lab 12/28/21 0250 12/28/21 0830 12/28/21 1156  GLUCAP 110* 111* 108*     Recent Results (from the past 240 hour(s))  SARS Coronavirus 2 by RT PCR (hospital order, performed in [facility]  hospital lab) *cepheid single result test* Anterior Nasal Swab     Status: Abnormal   Collection Time: 12/27/21  9:05 PM   Specimen: Anterior Nasal Swab  Result Value Ref Range Status   SARS Coronavirus 2 by RT PCR POSITIVE (A) NEGATIVE Final    Comment: (NOTE) SARS-CoV-2 target nucleic acids are DETECTED  SARS-CoV-2 RNA is generally detectable in upper respiratory specimens  during the acute phase of infection.  Positive results are indicative  of the presence of the identified virus, but do not rule out bacterial infection or co-infection with other pathogens not detected by the test.  Clinical correlation with patient history and  other diagnostic information is necessary to determine patient infection status.   The expected result is negative.  Fact Sheet for Patients:   https://www.patel.info/   Fact Sheet for Healthcare Providers:   https://hall.com/    This test is not yet approved or cleared by the [place] FDA and  has been authorized for detection and/or diagnosis of SARS-CoV-2 by FDA under an Emergency Use Authorization (EUA).  This EUA will remain in effect (meaning this test can be used) for the duration of  the COVID-19 declaration under Section 564(b)(1)  of the Act, 21 U.S.C. section 360-bbb-3(b)(1), unless the authorization is terminated or revoked sooner.   Performed at [facility], [address] [place], [place] [address]   Blood culture (routine x 2)     Status: None (Preliminary result)   Collection Time: 12/27/21  9:05 PM   Specimen: BLOOD  Result Value Ref Range Status   Specimen Description BLOOD LEFT AC  Final   Special Requests   Final    BOTTLES DRAWN AEROBIC AND ANAEROBIC Blood Culture results may not be optimal due to an excessive volume of blood received in culture bottles   Culture   Final    NO GROWTH < 12 HOURS Performed at [facility], [address] [place], [place] [address]    Report Status PENDING  Incomplete  Respiratory (~20 pathogens) panel by PCR     Status: None   Collection Time: 12/27/21  9:05 PM   Specimen: Nasopharyngeal Swab; Respiratory  Result Value Ref Range Status   Adenovirus NOT DETECTED NOT DETECTED Final   Coronavirus 229E NOT DETECTED NOT DETECTED Final    Comment: (NOTE) The Coronavirus on the Respiratory Panel, DOES NOT test for the novel  Coronavirus (2019 nCoV)    Coronavirus HKU1 NOT DETECTED NOT DETECTED Final   Coronavirus NL63 NOT DETECTED NOT DETECTED Final   Coronavirus OC43 NOT DETECTED NOT DETECTED Final   Metapneumovirus NOT DETECTED NOT DETECTED Final   Rhinovirus / Enterovirus NOT DETECTED NOT DETECTED Final   Influenza A NOT DETECTED NOT  DETECTED Final   Influenza B NOT DETECTED NOT DETECTED Final   Parainfluenza Virus 1 NOT DETECTED NOT DETECTED Final   Parainfluenza Virus 2 NOT DETECTED NOT DETECTED Final   Parainfluenza Virus 3 NOT DETECTED NOT DETECTED Final   Parainfluenza Virus 4 NOT DETECTED NOT DETECTED Final   Respiratory Syncytial Virus NOT DETECTED NOT DETECTED Final   Bordetella pertussis NOT DETECTED NOT DETECTED Final   Bordetella Parapertussis NOT DETECTED NOT DETECTED Final   Chlamydophila pneumoniae NOT DETECTED NOT DETECTED Final   Mycoplasma pneumoniae NOT DETECTED NOT DETECTED Final    Comment: Performed at [facility], [address], [place], [place] [address]  Blood culture (routine x 2)     Status: None (Preliminary result)  Collection Time: 12/27/21 10:48 PM   Specimen: BLOOD  Result Value Ref Range Status   Specimen Description BLOOD RIGHT HAND  Final   Special Requests   Final    BOTTLES DRAWN AEROBIC AND ANAEROBIC Blood Culture adequate volume   Culture   Final    NO GROWTH < 12 HOURS Performed at [facility], [address] [place], [place] [address]    Report Status PENDING  Incomplete         Radiology Studies: CT Angio Chest Pulmonary Embolism (PE) W or WO Contrast  Result Date: 12/28/2021 CLINICAL DATA:  High clinical suspicion for PE EXAM: CT ANGIOGRAPHY CHEST WITH CONTRAST TECHNIQUE: Multidetector CT imaging of the chest was performed using the standard protocol during bolus administration of intravenous contrast. Multiplanar CT image reconstructions and MIPs were obtained to evaluate the vascular anatomy. RADIATION DOSE REDUCTION: This exam was performed according to the departmental dose-optimization program which includes automated exposure control, adjustment of the mA and/or kV according to patient size and/or use of iterative reconstruction technique. CONTRAST:  49m OMNIPAQUE IOHEXOL 350 MG/ML SOLN COMPARISON:  Previous studies including the chest  radiograph done on 12/27/2021 FINDINGS: Cardiovascular: There is homogeneous enhancement in thoracic aorta. There are no intraluminal filling defects in pulmonary artery branches. There is ectasia of the main pulmonary artery measuring 3.8 cm suggesting pulmonary arterial hypertension. Mediastinum/Nodes: There are slightly enlarged lymph nodes in mediastinum. Lungs/Pleura: There is no focal pulmonary consolidation. There is no pleural effusion or pneumothorax. Upper Abdomen: There is fatty infiltration liver. Spleen measures 14 cm in AP diameter. Musculoskeletal: No acute findings are seen. Review of the MIP images confirms the above findings. IMPRESSION: There is no evidence of pulmonary artery embolism. There is no evidence of thoracic aortic dissection. There is no focal pulmonary consolidation. Fatty liver.  Enlarged spleen. Electronically Signed   By: PElmer PickerM.D.   On: 12/28/2021 13:51   CT ABDOMEN PELVIS W CONTRAST  Result Date: 12/27/2021 CLINICAL DATA:  Abdominal pain. Patient complains of cough fever for 2 days. EXAM: CT ABDOMEN AND PELVIS WITH CONTRAST TECHNIQUE: Multidetector CT imaging of the abdomen and pelvis was performed using the standard protocol following bolus administration of intravenous contrast. RADIATION DOSE REDUCTION: This exam was performed according to the departmental dose-optimization program which includes automated exposure control, adjustment of the mA and/or kV according to patient size and/or use of iterative reconstruction technique. CONTRAST:  1030mOMNIPAQUE IOHEXOL 300 MG/ML  SOLN COMPARISON:  None Available. FINDINGS: Lower chest: No acute abnormality. Mild right breast skin thickening. Hepatobiliary: No focal liver abnormality is seen. No gallstones, gallbladder wall thickening, or biliary dilatation. Pancreas: Unremarkable. No pancreatic ductal dilatation or surrounding inflammatory changes. Spleen: Normal in size without focal abnormality. Adrenals/Urinary  Tract: Mild left adrenal thickening. Kidneys are normal, without renal calculi, focal lesion, or hydronephrosis. Bladder is unremarkable. Stomach/Bowel: Stomach is within normal limits. Appendix appears normal. No evidence of bowel wall thickening, distention, or inflammatory changes. Vascular/Lymphatic: No significant vascular findings are present. No enlarged abdominal or pelvic lymph nodes. Reproductive: Mildly enlarged uterus.  No adnexal mass. Other: No abdominal wall hernia or abnormality. No abdominopelvic ascites. Musculoskeletal: Degenerate disc disease of the lumbar spine prominent at L4-L5 and L5-S1. No acute osseous abnormality. IMPRESSION: 1.  No CT evidence of acute abdominal/pelvic process. 2. Mild left adrenal nodular thickening, which likely represent small adrenal adenoma. 3. Bowel loops are normal in caliber. Normal appendix. No evidence of colitis or diverticulitis. 3. Mildly enlarged globular  uterus, pelvic sonogram for further evaluation would be helpful on a nonemergent basis. 4. 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 Electronically Signed   By: Keane Police D.O.   On: 12/27/2021 23:12   DG Chest 2 View  Result Date: 12/27/2021 CLINICAL DATA:  Cough, chest pain and shortness of breath. EXAM: CHEST - 2 VIEW COMPARISON:  November 09, 2021 FINDINGS: There is stable left-sided venous Port-A-Cath positioning. The heart size and mediastinal contours are within normal limits. Both lungs are clear. The visualized skeletal structures are unremarkable. IMPRESSION: No active cardiopulmonary disease. Electronically Signed   By: Virgina Norfolk M.D.   On: 12/27/2021 21:34        Scheduled Meds:  vitamin C  500 mg Oral Daily   calcium-vitamin D  1 tablet Oral BID   ferrous sulfate  325 mg Oral BID WC   guaiFENesin  1,200 mg Oral BID   insulin aspart  0-20 Units Subcutaneous TID WC   insulin aspart  0-5 Units Subcutaneous QHS    Ipratropium-Albuterol  1 puff Inhalation Q6H   norethindrone  1 tablet Oral Daily   polyethylene glycol  17 g Oral Daily   senna-docusate  1 tablet Oral BID   zinc sulfate  220 mg Oral Daily   Continuous Infusions:  sodium chloride     ceFEPime (MAXIPIME) IV Stopped (12/28/21 1634)   metronidazole Stopped (12/28/21 1635)   [START ON 12/29/2021] remdesivir 100 mg in sodium chloride 0.9 % 100 mL IVPB     vancomycin Stopped (12/28/21 1331)     LOS: 1 day    Time spent: 40 minutes    Irine Seal, MD [facility]   To contact the attending provider between 7A-7P or the covering provider during after hours 7P-7A, please log into the web site [email or web address] and access using universal [place] password for that web site. If you do not have the password, please call the hospital operator.  12/28/2021, 6:17 PM

## 2021-12-28 NOTE — Sepsis Progress Note (Addendum)
Notified bedside nurse of need to draw repeat lactic acid. Bedside said the lab will be collected after the patient's fluids finish. Will continue to monitor code sepsis.

## 2021-12-28 NOTE — Assessment & Plan Note (Addendum)
secondary to marrow suppression secondary to recent chemotherapy Suspect respiratory source given symptoms, COVID-positive, tachypnea, tachycardia. Patient not hypoxic  Inflammatory markers obtained. Patient pancultured. Initially on empiric IV vancomycin, IV cefepime, IV Flagyl, IV remdesivir. Vanc and Flagyl d/c 12/29/21  CT angiogram chest done was negative for PE. Oncology following  Repeat labs in the AM. Neutropenic precautions

## 2021-12-28 NOTE — Consult Note (Addendum)
Hematology/Oncology Consult note [phone] [phone]      Patient Care Team: Latanya Maudlin, NP as PCP - General (Family Medicine) Daiva Huge, RN as Oncology Nurse Navigator Cammie Sickle, [name]   Name of the patient: Marilyn Page  [id number]  Jun 27, 1975   Date of visit: 12/28/21 REASON FOR COSULTATION:  Neutropenic fever  History of presenting illness-  46 y.o. female with PMH listed at below who presents to ER for evaluation of fever since yesterday, cough, shortness of breath. Patient is known to oncology service Dr. Rogue Bussing, for treatment of right breast cancer, ER PR positive, HER2 positive.  Patient is currently on neoadjuvant chemotherapy TCHP, status post 3 cycles, last chemotherapy on 12/22/2021, she received long-acting G-CSF on 12/23/2021. Has noticed fever of 102. She has also experienced shortness of breath with exertion.  Cough.  And nausea vomiting.  She endorses upper abdominal pain earlier which has now resolved.  She has anterior chest wall port which is not red or swelling. No chest pain, hemoptysis. Emergency room, 12/27/2021 CBC showed total white count 0.7, hemoglobin 11.4, neutrophil 0.3, platelet count 90,000.  UA showed small leukocytes, negative nitrate.  Urine culture is pending. Chest x-ray showed no acute process. 12/27/2021, CT abdomen pelvis with contrast showed no CT evidence of acute abdominal/pelvic process.  Mild left adrenal nodular thickening, mildly enlarged globular uterus.  No evidence of colitis, appendicitis, diverticulitis.  Degenerative disc disease of lumbar without acute osseous abnormality.  Patient is admitted for neutropenic fever.  Started on empiric broad-spectrum antibiotics.  Hematology oncology was consulted for further evaluation.  Patient's husband at the bedside.  Patient feels abdominal pain has improved since admission.  Shortness of breath after ambulation.   She is tachycardic with a heart rate of 126.  Review of Systems  Constitutional:  Positive for appetite change and fatigue. Negative for chills and fever.  HENT:   Negative for hearing loss and voice change.   Eyes:  Negative for eye problems.  Respiratory:  Positive for shortness of breath. Negative for chest tightness and cough.   Cardiovascular:  Negative for chest pain.  Gastrointestinal:  Positive for nausea and vomiting. Negative for abdominal distention, abdominal pain and blood in stool.  Endocrine: Negative for hot flashes.  Genitourinary:  Negative for difficulty urinating and frequency.   Musculoskeletal:  Negative for arthralgias.  Skin:  Negative for itching and rash.  Neurological:  Negative for extremity weakness.  Hematological:  Negative for adenopathy.  Psychiatric/Behavioral:  Negative for confusion.     Allergies  Allergen Reactions   Metformin Other (See Comments)    Patient Active Problem List   Diagnosis Date Noted   COVID-19 virus infection 12/28/2021   Thrombocytopenia ([place]) 12/28/2021   Diabetes mellitus ([place]) 12/28/2021   Febrile neutropenia ([place]) 12/27/2021   Sepsis ([place]) 12/27/2021   Genetic testing 12/01/2021   Carcinoma of upper-outer quadrant of right breast in female, estrogen receptor positive ([place]) 10/26/2021   ASCUS of cervix with negative high risk HPV 04/23/2021   Iron deficiency anemia due to chronic blood loss 04/23/2021   Menorrhagia with regular cycle 10/14/2017   BMI 37.0-37.9, adult 10/03/2017   History of gestational diabetes 10/03/2017     Past Medical History:  Diagnosis Date   Carcinoma of upper-outer quadrant of right breast in female, estrogen receptor positive ([place]) 10/26/2021   Diabetes mellitus without complication ([place])    History of gestational diabetes      Past  Surgical History:  Procedure Laterality Date   BREAST BIOPSY Right 10/19/2021   [place] Core bx 10:00 6 cmfn Ribbon Clip. Axilla Butterfly hydro clip- path  pending   CESAREAN SECTION  2005/2008   DILATION AND CURETTAGE OF UTERUS  2003   PORTACATH PLACEMENT Left 11/09/2021   Procedure: INSERTION PORT-A-CATH;  Surgeon: Robert Bellow, MD;  Location: [facility] ORS;  Service: General;  Laterality: Left;   SKIN BIOPSY Right 11/09/2021   Procedure: PUNCH BIOPSY SKIN, TATTOO LYMPH NODE RIGHT AXILLA;  Surgeon: Robert Bellow, MD;  Location: [facility] ORS;  Service: General;  Laterality: Right;    Social History   Socioeconomic History   Marital status: Married    Spouse name: Jaci Standard   Number of children: 2   Years of education: Not on file   Highest education level: Not on file  Occupational History   Occupation: and Optometrist  Tobacco Use   Smoking status: Never   Smokeless tobacco: Never  Vaping Use   Vaping Use: Never used  Substance and Sexual Activity   Alcohol use: Never   Drug use: Never   Sexual activity: Yes    Birth control/protection: Other-see comments, Surgical    Comment: vasectomy  Other Topics Concern   Not on file  Social History Narrative   Pre-school teacher; in [place]; 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    Social Determinants of Health   Financial Resource Strain: Not on file  Food Insecurity: Not on file  Transportation Needs: Not on file  Physical Activity: Inactive (09/26/2017)   Exercise Vital Sign    Days of Exercise per Week: 0 days    Minutes of Exercise per Session: 0 min  Stress: Not on file  Social Connections: Not on file  Intimate Partner Violence: Not on file     Family History  Problem Relation Age of Onset   Ovarian cysts Mother    Migraines Mother    Melanoma Mother        on leg   Hyperlipidemia Father    Diabetes Maternal Aunt    Stomach cancer Paternal Aunt    Diabetes Maternal Grandfather    Lymphoma Cousin 17   Breast cancer Neg Hx      Current Facility-Administered Medications:    acetaminophen (TYLENOL) tablet 650 mg, 650 mg, Oral, Q6H PRN **OR** acetaminophen (TYLENOL)  suppository 650 mg, 650 mg, Rectal, Q6H PRN, Athena Masse, MD   ascorbic acid (VITAMIN C) tablet 500 mg, 500 mg, Oral, Daily, Eugenie Filler, MD, 500 mg at 12/28/21 1000   calcium-vitamin D (OSCAL WITH D) 500-5 MG-MCG per tablet 1 tablet, 1 tablet, Oral, BID, Eugenie Filler, MD, 1 tablet at 12/28/21 1000   ceFEPIme (MAXIPIME) 2 g in sodium chloride 0.9 % 100 mL IVPB, 2 g, Intravenous, Q8H, Noralee Space, RPH, Stopped at 12/28/21 0174   chlorpheniramine-HYDROcodone 10-8 MG/5ML suspension 5 mL, 5 mL, Oral, Q12H PRN, Judd Gaudier V, MD, 5 mL at 12/28/21 0326   chlorpheniramine-HYDROcodone 10-8 MG/5ML suspension 5 mL, 5 mL, Oral, Q12H PRN, Eugenie Filler, MD   ferrous sulfate tablet 325 mg, 325 mg, Oral, BID WC, Eugenie Filler, MD   guaiFENesin (MUCINEX) 12 hr tablet 1,200 mg, 1,200 mg, Oral, BID, Eugenie Filler, MD   guaiFENesin-dextromethorphan (ROBITUSSIN DM) 100-10 MG/5ML syrup 10 mL, 10 mL, Oral, Q4H PRN, Athena Masse, MD   insulin aspart (novoLOG) injection 0-20 Units, 0-20 Units, Subcutaneous, TID WC, Athena Masse, MD  insulin aspart (novoLOG) injection 0-5 Units, 0-5 Units, Subcutaneous, QHS, Damita Dunnings, Waldemar Dickens, MD   Ipratropium-Albuterol (COMBIVENT) respimat 1 puff, 1 puff, Inhalation, Q6H, Eugenie Filler, MD   magic mouthwash w/lidocaine, 5 mL, Oral, QID PRN, Eugenie Filler, MD   metroNIDAZOLE (FLAGYL) IVPB 500 mg, 500 mg, Intravenous, Q12H, Athena Masse, MD, Stopped at 12/28/21 0445   norethindrone (MICRONOR) 0.35 MG tablet 0.35 mg, 1 tablet, Oral, Daily, Eugenie Filler, MD   ondansetron [facility]) tablet 4 mg, 4 mg, Oral, Q6H PRN **OR** ondansetron (ZOFRAN) injection 4 mg, 4 mg, Intravenous, Q6H PRN, Athena Masse, MD   polyethylene glycol (MIRALAX / GLYCOLAX) packet 17 g, 17 g, Oral, Daily, Eugenie Filler, MD   [COMPLETED] remdesivir 200 mg in sodium chloride 0.9% 250 mL IVPB, 200 mg, Intravenous, Once, Stopped at 12/28/21 0539 **FOLLOWED  BY** [START ON 12/29/2021] remdesivir 100 mg in sodium chloride 0.9 % 100 mL IVPB, 100 mg, Intravenous, Daily, Athena Masse, MD   senna-docusate (Senokot-S) tablet 1 tablet, 1 tablet, Oral, BID, Eugenie Filler, MD   vancomycin (VANCOREADY) IVPB 750 mg/150 mL, 750 mg, Intravenous, Q12H, Noralee Space, RPH, Last Rate: 150 mL/hr at 12/28/21 1201, 750 mg at 12/28/21 1201   zinc sulfate capsule 220 mg, 220 mg, Oral, Daily, Eugenie Filler, MD, 220 mg at 12/28/21 1000  Current Outpatient Medications:    calcium-vitamin D (OSCAL WITH D) 500-5 MG-MCG tablet, Take 1 tablet by mouth 2 (two) times daily., Disp: 60 tablet, Rfl: 2   dexamethasone (DECADRON) 4 MG tablet, One pill twice a day - Start the day prior to chemo; do not take the day of chemo. (Patient taking differently: Take 4 mg by mouth 2 (two) times daily. Start the day prior to chemo; do not take the day of chemo.), Disp: 40 tablet, Rfl: 0   ferrous sulfate 325 (65 FE) MG tablet, Take 325 mg by mouth 2 (two) times daily with a meal., Disp: , Rfl:    lidocaine-prilocaine (EMLA) cream, Apply on the port. 30 -45 min  prior to port access., Disp: 30 g, Rfl: 3   norethindrone (MICRONOR) 0.35 MG tablet, Take 1 tablet (0.35 mg total) by mouth daily., Disp: 84 tablet, Rfl: 2   ondansetron (ZOFRAN-ODT) 8 MG disintegrating tablet, Take 1 tablet (8 mg total) by mouth every 8 (eight) hours as needed for nausea or vomiting., Disp: 45 tablet, Rfl: 1   prochlorperazine (COMPAZINE) 10 MG tablet, Take 1 tablet (10 mg total) by mouth every 6 (six) hours as needed for nausea or vomiting., Disp: 40 tablet, Rfl: 1   Semaglutide, 1 MG/DOSE, 4 MG/3ML SOPN, Inject into the skin. Inject 1 mg (0.75 ml) subcutaneously once a week., Disp: , Rfl:    ciprofloxacin (CIPRO) 500 MG tablet, Take 1 tablet (500 mg total) by mouth 2 (two) times daily. (Patient not taking: Reported on 12/27/2021), Disp: 14 tablet, Rfl: 0   magic mouthwash w/lidocaine SOLN, Take 5 mLs by  mouth 4 (four) times daily as needed for mouth pain. Sig: Swish/Swallow 5-10 ml four times a day as needed. Dispense 480 ml. 1RF, Disp: 480 mL, Rfl: 1   ONETOUCH VERIO test strip, SMARTSIG:1 Each Via Meter Daily PRN, Disp: , Rfl:    OZEMPIC, 0.25 OR 0.5 MG/DOSE, 2 MG/1.5ML SOPN, PLEASE SEE ATTACHED FOR DETAILED DIRECTIONS (Patient not taking: Reported on 12/28/2021), Disp: , Rfl:    Physical exam:  Vitals:   12/28/21 0800 12/28/21 0945 12/28/21 1001 12/28/21 1100  BP: (!) 110/49  (!) 113/52 108/65  Pulse: (!) 103 (!) 104 (!) 107 (!) 110  Resp: 16 12 15 15   Temp:  99.3 F (37.4 C)    TempSrc:  Oral    SpO2: 99% 100% 100% 100%  Weight:      Height:       Physical Exam Constitutional:      General: She is not in acute distress.    Appearance: She is not diaphoretic.  HENT:     Head: Normocephalic and atraumatic.     Nose: Nose normal.     Mouth/Throat:     Pharynx: No oropharyngeal exudate.  Eyes:     General: No scleral icterus.    Pupils: Pupils are equal, round, and reactive to light.  Cardiovascular:     Rate and Rhythm: Regular rhythm. Tachycardia present.     Heart sounds: No murmur heard. Pulmonary:     Effort: Pulmonary effort is normal. No respiratory distress.     Breath sounds: No rales.  Chest:     Chest wall: No tenderness.  Abdominal:     General: There is no distension.     Palpations: Abdomen is soft.     Tenderness: There is no abdominal tenderness.  Musculoskeletal:        General: Normal range of motion.     Cervical back: Normal range of motion and neck supple.  Skin:    General: Skin is warm and dry.     Coloration: Skin is pale.     Findings: No erythema.  Neurological:     Mental Status: She is alert and oriented to person, place, and time.     Cranial Nerves: No cranial nerve deficit.     Motor: No abnormal muscle tone.     Coordination: Coordination normal.  Psychiatric:        Mood and Affect: Mood and affect normal.             Latest Ref Rng & Units 12/28/2021    3:29 AM  CMP  Glucose 70 - 99 mg/dL 111   BUN 6 - 20 mg/dL 13   Creatinine 0.44 - 1.00 mg/dL 0.76   Sodium 135 - 145 mmol/L 137   Potassium 3.5 - 5.1 mmol/L 3.3   Chloride 98 - 111 mmol/L 106   CO2 22 - 32 mmol/L 24   Calcium 8.9 - 10.3 mg/dL 8.1       Latest Ref Rng & Units 12/28/2021    3:29 AM  CBC  WBC 4.0 - 10.5 K/uL 0.7   Hemoglobin 12.0 - 15.0 g/dL 9.5   Hematocrit 36.0 - 46.0 % 28.6   Platelets 150 - 400 K/uL 74     RADIOGRAPHIC STUDIES: I have personally reviewed the radiological images as listed and agreed with the findings in the report. CT ABDOMEN PELVIS W CONTRAST  Result Date: 12/27/2021 CLINICAL DATA:  Abdominal pain. Patient complains of cough fever for 2 days. EXAM: CT ABDOMEN AND PELVIS WITH CONTRAST TECHNIQUE: Multidetector CT imaging of the abdomen and pelvis was performed using the standard protocol following bolus administration of intravenous contrast. RADIATION DOSE REDUCTION: This exam was performed according to the departmental dose-optimization program which includes automated exposure control, adjustment of the mA and/or kV according to patient size and/or use of iterative reconstruction technique. CONTRAST:  149m OMNIPAQUE IOHEXOL 300 MG/ML  SOLN COMPARISON:  None Available. FINDINGS: Lower chest: No acute abnormality. Mild right breast skin thickening. Hepatobiliary: No focal  liver abnormality is seen. No gallstones, gallbladder wall thickening, or biliary dilatation. Pancreas: Unremarkable. No pancreatic ductal dilatation or surrounding inflammatory changes. Spleen: Normal in size without focal abnormality. Adrenals/Urinary Tract: Mild left adrenal thickening. Kidneys are normal, without renal calculi, focal lesion, or hydronephrosis. Bladder is unremarkable. Stomach/Bowel: Stomach is within normal limits. Appendix appears normal. No evidence of bowel wall thickening, distention, or inflammatory changes. Vascular/Lymphatic:  No significant vascular findings are present. No enlarged abdominal or pelvic lymph nodes. Reproductive: Mildly enlarged uterus.  No adnexal mass. Other: No abdominal wall hernia or abnormality. No abdominopelvic ascites. Musculoskeletal: Degenerate disc disease of the lumbar spine prominent at L4-L5 and L5-S1. No acute osseous abnormality. IMPRESSION: 1.  No CT evidence of acute abdominal/pelvic process. 2. Mild left adrenal nodular thickening, which likely represent small adrenal adenoma. 3. Bowel loops are normal in caliber. Normal appendix. No evidence of colitis or diverticulitis. 3. Mildly enlarged globular uterus, pelvic sonogram for further evaluation would be helpful on a nonemergent basis. 4. 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 Electronically Signed   By: Keane Police D.O.   On: 12/27/2021 23:12   DG Chest 2 View  Result Date: 12/27/2021 CLINICAL DATA:  Cough, chest pain and shortness of breath. EXAM: CHEST - 2 VIEW COMPARISON:  November 09, 2021 FINDINGS: There is stable left-sided venous Port-A-Cath positioning. The heart size and mediastinal contours are within normal limits. Both lungs are clear. The visualized skeletal structures are unremarkable. IMPRESSION: No active cardiopulmonary disease. Electronically Signed   By: Virgina Norfolk M.D.   On: 12/27/2021 21:34    Assessment and plan-   #Neutropenic fever, secondary to marrow suppression secondary to recent chemotherapy. She has received long-acting G-CSF on 719.  No benefit of short acting G-CSF at this point Continue empiric broad-spectrum antibiotics cefepime and vancomycin..  Follow-up cultures.  Monitor CBC with differential daily  #Shortness of breath, tachycardia, D-dimer positive.  Recommend to obtain CT chest PE protocol.  Negative for PE.  COVID testing came back positive.  She has been started on antiviral treatment.  #Thrombocytopenia, likely secondary to  chemotherapy.  Close monitor. #Anemia, secondary to chemotherapy.  Continue supportive care.  Monitor counts. #Breast cancer, on neoadjuvant chemotherapy.  Last chemotherapy 12/22/2021.  Follow-up outpatient with Dr. Rogue Bussing. Thank you for allowing me to participate in the care of this patient.    Earlie Server, MD, PhD Hematology Oncology  12/28/2021

## 2021-12-28 NOTE — Assessment & Plan Note (Addendum)
Patient is on chemotherapy And last treatment was Tuesday, July 18 -Oncology consulted.

## 2021-12-28 NOTE — ED Notes (Signed)
Patient given breakfast tray.

## 2021-12-28 NOTE — H&P (Signed)
History and Physical    Patient: Marilyn Page:[id number] DOB: [birth year] DOA: 12/27/2021 DOS: the patient was seen and examined on 12/28/2021 PCP: Latanya Maudlin, NP  Patient coming from: Home  Chief Complaint:  Chief Complaint  Patient presents with   Fever   Chest Pain   Cough   Shortness of Breath    HPI: Marilyn Page is a 46 y.o. female with medical history significant for Breast cancer on chemotherapy, history of gestational diabetes who presents to the ED with 1 day history of nonproductive cough and shortness of breath with a fever of 102.9 developing earlier in the day.  States her husband has a similar cough.  She denies chest pain.  Denies abdominal pain but endorses constipation and seeing blood mixed in with stool when having a bowel movement.  Has had no nausea or vomiting or dysuria. ED course and data review: Tmax 100.4 with pulse 145, respirations 28 with O2 sat 98% on room air and BP 107/72. Labs: WBC 0.7 with relative neutrophils 51%, lactic acid 2.1 and COVID positive.  Platelets 90,000, hemoglobin 11.4 BMP mostly unremarkable except for blood sugar of 192, LFTs with slight transaminase elevation of AST 42 and ALT 51.  Troponin 4, respiratory viral panel pending, urinalysis unremarkable EKG, personally viewed and interpreted showing sinus tachycardia at 145 with no acute ST-T wave changes Imaging: Chest x-ray with no active disease CT abdomen and pelvis with contrast showing the following IMPRESSION: 1.  No CT evidence of acute abdominal/pelvic process.   2. Mild left adrenal nodular thickening, which likely represent small adrenal adenoma.   3. Bowel loops are normal in caliber. Normal appendix. No evidence of colitis or diverticulitis.   3. Mildly enlarged globular uterus, pelvic sonogram for further evaluation would be helpful on a nonemergent basis.   4. Degenerate disc disease of the lumbar spine prominent at L4-L5 and L5-S1 no acute osseous  abnormality.     Patient started on cefepime and vancomycin and given a fluid bolus for sepsis of unknown source.  Hospitalist consulted for admission.     Past Medical History:  Diagnosis Date   Carcinoma of upper-outer quadrant of right breast in female, estrogen receptor positive ([place]) 10/26/2021   Diabetes mellitus without complication ([place])    History of gestational diabetes    Past Surgical History:  Procedure Laterality Date   BREAST BIOPSY Right 10/19/2021   [place] Core bx 10:00 6 cmfn Ribbon Clip. Axilla Butterfly hydro clip- path pending   CESAREAN SECTION  2005/2008   DILATION AND CURETTAGE OF UTERUS  2003   PORTACATH PLACEMENT Left 11/09/2021   Procedure: INSERTION PORT-A-CATH;  Surgeon: Robert Bellow, MD;  Location: [facility] ORS;  Service: General;  Laterality: Left;   SKIN BIOPSY Right 11/09/2021   Procedure: PUNCH BIOPSY SKIN, TATTOO LYMPH NODE RIGHT AXILLA;  Surgeon: Robert Bellow, MD;  Location: [facility] ORS;  Service: General;  Laterality: Right;   Social History:  reports that she has never smoked. She has never used smokeless tobacco. She reports that she does not drink alcohol and does not use drugs.  Allergies  Allergen Reactions   Metformin Other (See Comments)    Family History  Problem Relation Age of Onset   Ovarian cysts Mother    Migraines Mother    Melanoma Mother        on leg   Hyperlipidemia Father    Diabetes Maternal Aunt    Stomach cancer Paternal Aunt  Diabetes Maternal Grandfather    Lymphoma Cousin 17   Breast cancer Neg Hx     Prior to Admission medications   Medication Sig Start Date End Date Taking? Authorizing Provider  calcium-vitamin D (OSCAL WITH D) 500-5 MG-MCG tablet Take 1 tablet by mouth 2 (two) times daily. 11/16/21   Borders, Kirt Boys, NP  ciprofloxacin (CIPRO) 500 MG tablet Take 1 tablet (500 mg total) by mouth 2 (two) times daily. Patient not taking: Reported on 12/27/2021 11/24/21   Cammie Sickle, MD   dexamethasone (DECADRON) 4 MG tablet One pill twice a day - Start the day prior to chemo; do not take the day of chemo. Patient taking differently: Take 4 mg by mouth 2 (two) times daily. Start the day prior to chemo; do not take the day of chemo. 11/10/21   Cammie Sickle, MD  ferrous sulfate 325 (65 FE) MG tablet Take 325 mg by mouth 2 (two) times daily with a meal. 02/14/21   [provider]  lidocaine-prilocaine (EMLA) cream Apply on the port. 30 -45 min  prior to port access. 10/26/21   Cammie Sickle, MD  magic mouthwash w/lidocaine SOLN Take 5 mLs by mouth 4 (four) times daily as needed for mouth pain. Sig: Swish/Swallow 5-10 ml four times a day as needed. Dispense 480 ml. 1RF 11/19/21   Cammie Sickle, MD  norethindrone (MICRONOR) 0.35 MG tablet Take 1 tablet (0.35 mg total) by mouth daily. 3/81/82   Copland, Elmo Putt B, PA-C  ondansetron (ZOFRAN-ODT) 8 MG disintegrating tablet Take 1 tablet (8 mg total) by mouth every 8 (eight) hours as needed for nausea or vomiting. 10/26/21   Cammie Sickle, MD  [facility] VERIO test strip SMARTSIG:1 Each Via Meter Daily PRN 02/14/21   [provider]  OZEMPIC, 0.25 OR 0.5 MG/DOSE, 2 MG/1.5ML SOPN PLEASE SEE ATTACHED FOR DETAILED DIRECTIONS Patient not taking: Reported on 12/28/2021 06/09/21   [provider]  prochlorperazine (COMPAZINE) 10 MG tablet Take 1 tablet (10 mg total) by mouth every 6 (six) hours as needed for nausea or vomiting. 10/26/21   Cammie Sickle, MD  Semaglutide, 1 MG/DOSE, 4 MG/3ML SOPN Inject into the skin. Inject 1 mg (0.75 ml) subcutaneously once a week.    [provider]    Physical Exam: Vitals:   12/27/21 2102 12/27/21 2200 12/27/21 2230 12/27/21 2319  BP:  109/78 113/78   Pulse:  (!) 122 (!) 121 (!) 110  Resp:  (!) 21    Temp:  (!) 100.4 F (38 C)    TempSrc:  Oral    SpO2:  99% 98%   Weight: 99.8 kg     Height: '5\' 5"'$  (1.651 m)      Physical Exam Vitals and  nursing note reviewed.  Constitutional:      General: She is not in acute distress.    Appearance: She is ill-appearing.  HENT:     Head: Normocephalic and atraumatic.  Cardiovascular:     Rate and Rhythm: Regular rhythm. Tachycardia present.     Heart sounds: Normal heart sounds.  Pulmonary:     Effort: Tachypnea present.     Breath sounds: Normal breath sounds.  Abdominal:     Palpations: Abdomen is soft.     Tenderness: There is no abdominal tenderness.  Neurological:     Mental Status: Mental status is at baseline.     Labs on Admission: I have personally reviewed following labs and imaging studies  CBC:  Recent Labs  Lab 12/22/21 0838 12/27/21 2105  WBC 8.1 0.7*  NEUTROABS 6.9 0.3*  HGB 11.5* 11.4*  HCT 35.1* 33.9*  MCV 88.6 85.4  PLT 126* 90*   Basic Metabolic Panel: Recent Labs  Lab 12/22/21 0838 12/27/21 2105  NA 140 134*  K 4.4 3.6  CL 110 104  CO2 24 22  GLUCOSE 206* 192*  BUN 14 16  CREATININE 0.83 0.93  CALCIUM 9.1 8.6*   GFR: Estimated Creatinine Clearance: 87.5 mL/min (by C-G formula based on SCr of 0.93 mg/dL). Liver Function Tests: Recent Labs  Lab 12/22/21 0838 12/27/21 2105  AST 23 42*  ALT 33 51*  ALKPHOS 63 70  BILITOT 0.5 1.1  PROT 6.7 7.0  ALBUMIN 3.5 3.7   No results for input(s): "LIPASE", "AMYLASE" in the last 168 hours. No results for input(s): "AMMONIA" in the last 168 hours. Coagulation Profile: No results for input(s): "INR", "PROTIME" in the last 168 hours. Cardiac Enzymes: No results for input(s): "CKTOTAL", "CKMB", "CKMBINDEX", "TROPONINI" in the last 168 hours. BNP (last 3 results) No results for input(s): "PROBNP" in the last 8760 hours. HbA1C: No results for input(s): "HGBA1C" in the last 72 hours. CBG: No results for input(s): "GLUCAP" in the last 168 hours. Lipid Profile: No results for input(s): "CHOL", "HDL", "LDLCALC", "TRIG", "CHOLHDL", "LDLDIRECT" in the last 72 hours. Thyroid Function Tests: No  results for input(s): "TSH", "T4TOTAL", "FREET4", "T3FREE", "THYROIDAB" in the last 72 hours. Anemia Panel: No results for input(s): "VITAMINB12", "FOLATE", "FERRITIN", "TIBC", "IRON", "RETICCTPCT" in the last 72 hours. Urine analysis:    Component Value Date/Time   COLORURINE YELLOW (A) 12/27/2021 2317   APPEARANCEUR CLEAR (A) 12/27/2021 2317   LABSPEC 1.031 (H) 12/27/2021 2317   PHURINE 6.0 12/27/2021 2317   GLUCOSEU NEGATIVE 12/27/2021 2317   HGBUR NEGATIVE 12/27/2021 2317   BILIRUBINUR NEGATIVE 12/27/2021 2317   KETONESUR NEGATIVE 12/27/2021 2317   PROTEINUR NEGATIVE 12/27/2021 2317   NITRITE NEGATIVE 12/27/2021 2317   LEUKOCYTESUR SMALL (A) 12/27/2021 2317    Radiological Exams on Admission: CT ABDOMEN PELVIS W CONTRAST  Result Date: 12/27/2021 CLINICAL DATA:  Abdominal pain. Patient complains of cough fever for 2 days. EXAM: CT ABDOMEN AND PELVIS WITH CONTRAST TECHNIQUE: Multidetector CT imaging of the abdomen and pelvis was performed using the standard protocol following bolus administration of intravenous contrast. RADIATION DOSE REDUCTION: This exam was performed according to the departmental dose-optimization program which includes automated exposure control, adjustment of the mA and/or kV according to patient size and/or use of iterative reconstruction technique. CONTRAST:  126m OMNIPAQUE IOHEXOL 300 MG/ML  SOLN COMPARISON:  None Available. FINDINGS: Lower chest: No acute abnormality. Mild right breast skin thickening. Hepatobiliary: No focal liver abnormality is seen. No gallstones, gallbladder wall thickening, or biliary dilatation. Pancreas: Unremarkable. No pancreatic ductal dilatation or surrounding inflammatory changes. Spleen: Normal in size without focal abnormality. Adrenals/Urinary Tract: Mild left adrenal thickening. Kidneys are normal, without renal calculi, focal lesion, or hydronephrosis. Bladder is unremarkable. Stomach/Bowel: Stomach is within normal limits. Appendix  appears normal. No evidence of bowel wall thickening, distention, or inflammatory changes. Vascular/Lymphatic: No significant vascular findings are present. No enlarged abdominal or pelvic lymph nodes. Reproductive: Mildly enlarged uterus.  No adnexal mass. Other: No abdominal wall hernia or abnormality. No abdominopelvic ascites. Musculoskeletal: Degenerate disc disease of the lumbar spine prominent at L4-L5 and L5-S1. No acute osseous abnormality. IMPRESSION: 1.  No CT evidence of acute abdominal/pelvic process. 2. Mild left adrenal nodular thickening, which likely represent  small adrenal adenoma. 3. Bowel loops are normal in caliber. Normal appendix. No evidence of colitis or diverticulitis. 3. Mildly enlarged globular uterus, pelvic sonogram for further evaluation would be helpful on a nonemergent basis. 4. 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 Electronically Signed   By: Keane Police D.O.   On: 12/27/2021 23:12   DG Chest 2 View  Result Date: 12/27/2021 CLINICAL DATA:  Cough, chest pain and shortness of breath. EXAM: CHEST - 2 VIEW COMPARISON:  November 09, 2021 FINDINGS: There is stable left-sided venous Port-A-Cath positioning. The heart size and mediastinal contours are within normal limits. Both lungs are clear. The visualized skeletal structures are unremarkable. IMPRESSION: No active cardiopulmonary disease. Electronically Signed   By: Virgina Norfolk M.D.   On: 12/27/2021 21:34     Data Reviewed: Relevant notes from primary care and specialist visits, past discharge summaries as available in EHR, including Care Everywhere. Prior diagnostic testing as pertinent to current admission diagnoses Updated medications and problem lists for reconciliation ED course, including vitals, labs, imaging, treatment and response to treatment Triage notes, nursing and pharmacy notes and ED provider's notes Notable results as noted in  HPI   Assessment and Plan: * Febrile neutropenia ([place]) Suspect respiratory source given symptoms, COVID-positive Treat COVID Continue broad-spectrum antibiotics pending completion of work-up Follow cultures Consider ID consult and oncology consults in the a.m. Neutropenic precautions  Sepsis ([place]) Source is likely respiratory and related to COVID infection though chest x-ray is clear Continue broad-spectrum antibiotics for sepsis of unknown source pending further work-up Continue sepsis fluids Treat COVID as outlined below   COVID-19 virus infection Remdesivir, antitussives, albuterol Airborne precautions Supplemental oxygen if needed  Diabetes mellitus (H[facility]) Sliding scale insulin coverage  Thrombocytopenia ([facility]) Chemotherapy induced Platelets 90,000 SCD for DVT prophylaxis Continue to monitor  Carcinoma of upper-outer quadrant of right breast in female, estrogen receptor positive ([place]) Patient is on chemotherapy And last treatment was Tuesday, July 18        DVT prophylaxis: SCDs  Consults: none  Advance Care Planning:   Code Status: Not on file full code  Family Communication: Husband at bedside.  Treatment plan explained including remdesivir for COVID and they voiced understanding and agreement  Disposition Plan: Back to previous home environment  Severity of Illness: The appropriate patient status for this patient is INPATIENT. Inpatient status is judged to be reasonable and necessary in order to provide the required intensity of service to ensure the patient's safety. The patient's presenting symptoms, physical exam findings, and initial radiographic and laboratory data in the context of their chronic comorbidities is felt to place them at high risk for further clinical deterioration. Furthermore, it is not anticipated that the patient will be medically stable for discharge from the hospital within 2 midnights of admission.   * I certify that at the point  of admission it is my clinical judgment that the patient will require inpatient hospital care spanning beyond 2 midnights from the point of admission due to high intensity of service, high risk for further deterioration and high frequency of surveillance required.*  Author: Athena Masse, MD 12/28/2021 12:55 AM  For on call review [email or web address].

## 2021-12-29 DIAGNOSIS — U071 COVID-19: Secondary | ICD-10-CM | POA: Diagnosis not present

## 2021-12-29 DIAGNOSIS — A419 Sepsis, unspecified organism: Secondary | ICD-10-CM | POA: Diagnosis not present

## 2021-12-29 DIAGNOSIS — D709 Neutropenia, unspecified: Secondary | ICD-10-CM | POA: Diagnosis not present

## 2021-12-29 DIAGNOSIS — C50411 Malignant neoplasm of upper-outer quadrant of right female breast: Secondary | ICD-10-CM | POA: Diagnosis not present

## 2021-12-29 DIAGNOSIS — E1169 Type 2 diabetes mellitus with other specified complication: Secondary | ICD-10-CM

## 2021-12-29 LAB — COMPREHENSIVE METABOLIC PANEL
ALT: 39 U/L (ref 0–44)
AST: 29 U/L (ref 15–41)
Albumin: 3.1 g/dL — ABNORMAL LOW (ref 3.5–5.0)
Alkaline Phosphatase: 51 U/L (ref 38–126)
Anion gap: 6 (ref 5–15)
BUN: 9 mg/dL (ref 6–20)
CO2: 25 mmol/L (ref 22–32)
Calcium: 8.3 mg/dL — ABNORMAL LOW (ref 8.9–10.3)
Chloride: 107 mmol/L (ref 98–111)
Creatinine, Ser: 0.71 mg/dL (ref 0.44–1.00)
GFR, Estimated: >60 mL/min (ref >60)
Glucose, Bld: 99 mg/dL (ref 70–99)
Potassium: 3.4 mmol/L — ABNORMAL LOW (ref 3.5–5.1)
Sodium: 138 mmol/L (ref 135–145)
Total Bilirubin: 0.5 mg/dL (ref 0.3–1.2)
Total Protein: 5.9 g/dL — ABNORMAL LOW (ref 6.5–8.1)

## 2021-12-29 LAB — FERRITIN: Ferritin: 542 ng/mL — ABNORMAL HIGH (ref 11–307)

## 2021-12-29 LAB — CBC WITH DIFFERENTIAL/PLATELET
Abs Immature Granulocytes: 0 10*3/uL (ref 0.00–0.07)
Basophils Absolute: 0 10*3/uL (ref 0.0–0.1)
Basophils Relative: 2 %
Eosinophils Absolute: 0 10*3/uL (ref 0.0–0.5)
Eosinophils Relative: 1 %
HCT: 27.9 % — ABNORMAL LOW (ref 36.0–46.0)
Hemoglobin: 9.3 g/dL — ABNORMAL LOW (ref 12.0–15.0)
Immature Granulocytes: 0 %
Lymphocytes Relative: 55 %
Lymphs Abs: 0.5 10*3/uL — ABNORMAL LOW (ref 0.7–4.0)
MCH: 29 pg (ref 26.0–34.0)
MCHC: 33.3 g/dL (ref 30.0–36.0)
MCV: 86.9 fL (ref 80.0–100.0)
Monocytes Absolute: 0.2 10*3/uL (ref 0.1–1.0)
Monocytes Relative: 25 %
Neutro Abs: 0.2 10*3/uL — CL (ref 1.7–7.7)
Neutrophils Relative %: 17 %
Platelets: 89 10*3/uL — ABNORMAL LOW (ref 150–400)
RBC: 3.21 MIL/uL — ABNORMAL LOW (ref 3.87–5.11)
RDW: 15.4 % (ref 11.5–15.5)
WBC: 0.9 10*3/uL — CL (ref 4.0–10.5)
nRBC: 0 % (ref 0.0–0.2)

## 2021-12-29 LAB — MAGNESIUM: Magnesium: 2 mg/dL (ref 1.7–2.4)

## 2021-12-29 LAB — URINE CULTURE: Culture: <10000 — AB

## 2021-12-29 LAB — GLUCOSE, CAPILLARY
Glucose-Capillary: 106 mg/dL — ABNORMAL HIGH (ref 70–99)
Glucose-Capillary: 104 mg/dL — ABNORMAL HIGH (ref 70–99)
Glucose-Capillary: 113 mg/dL — ABNORMAL HIGH (ref 70–99)
Glucose-Capillary: 109 mg/dL — ABNORMAL HIGH (ref 70–99)

## 2021-12-29 LAB — C-REACTIVE PROTEIN: CRP: 5.4 mg/dL — ABNORMAL HIGH (ref <1.0)

## 2021-12-29 LAB — D-DIMER, QUANTITATIVE: D-Dimer, Quant: 0.63 ug/mL-FEU — ABNORMAL HIGH (ref 0.00–0.50)

## 2021-12-29 LAB — PHOSPHORUS: Phosphorus: 2.9 mg/dL (ref 2.5–4.6)

## 2021-12-29 MED ORDER — ENSURE ENLIVE PO LIQD
237.0000 mL | Freq: Three times a day (TID) | ORAL | Status: DC
  Administered 2021-12-29 (×2): 237 mL via ORAL

## 2021-12-29 MED ORDER — PHENOL 1.4 % MT LIQD
1.0000 | OROMUCOSAL | Status: DC | PRN
  Administered 2021-12-29: 1 via OROMUCOSAL
  Filled 2021-12-29: qty 177

## 2021-12-29 MED ORDER — ADULT MULTIVITAMIN W/MINERALS CH
1.0000 | ORAL_TABLET | Freq: Every day | ORAL | Status: DC
Start: 2021-12-29 — End: 2021-12-30
  Administered 2021-12-29 – 2021-12-30 (×2): 1 via ORAL
  Filled 2021-12-29 (×2): qty 1

## 2021-12-29 NOTE — Progress Notes (Signed)
PROGRESS NOTE    Marilyn Page  MWN:[id number] DOB: 1975-11-22  DOA: 12/27/2021 Date of Service: 12/29/21 PCP: Latanya Maudlin, NP     Brief Narrative / Hospital Course:  Patient 46 year old female history of breast cancer on chemotherapy, history of gestational diabetes presented to the ED 12/27/21 w/ 1 day history of nonproductive cough, shortness of breath, fever as high as 102.9.  Has been noted to have had a similar cough recently.  Patient did endorse some constipation and seeing blood mixed in stool when having bowel movement.  No nausea vomiting or dysuria.   07/23 Patient seen in the ED noted to have a temperature of 100.4, tachycardic with heart rate as high as 145, tachypneic with respiratory rate of 28 with sats of 98% on room air. Meeting sepsis criteria. Blood pressure noted at 107/72.  Patient noted to be profoundly neutropenic with a white count of 0.7, lactic acid of 2.1, COVID-19 PCR positive.  Thrombocytopenia with a platelet kind of 90, hemoglobin of 11.4.  Slight transaminitis.  Respiratory viral panel negative.  Chest x-ray with no active disease.  CT abdomen and pelvis with no acute abnormalities.  Patient pancultured, placed on broad-spectrum antibiotics of IV vancomycin, cefepime, Flagyl.  Patient also started on remdesivir.  Oncology consulted 07/24: Oncology saw patient. Advised continue cefepime and vancomycin.  07/25: BCx no growth, continue respiratory coverage w/ cefepime = remdesevir, and d/c vanc   Consultants:  Oncology  Procedures: None    Subjective: Patient reports overall feeling improved compared to couple days ago, still has sore throat/cough.     ASSESSMENT & PLAN:   Principal Problem:   Febrile neutropenia (HCC) Active Problems:   Sepsis ([place])   COVID-19 virus infection   Carcinoma of upper-outer quadrant of right breast in female, estrogen receptor positive ([place])   Thrombocytopenia ([place])   Diabetes mellitus ([place])   Sepsis  ([place]) Most likely d/t [id number] Complicated by neutropenia d/t cancer treatment - Patient met criteria for sepsis on admission with fever, tachycardia, tachypnea, neutropenia, COVID-19 PCR positive.   -Source is likely respiratory and related to COVID infection though chest x-ray is clear. -Respiratory viral panel negative. - abx deescalated today -Patient received boluses of IV fluids on presentation. -Place on normal saline 125 cc an hour --> plan po intake once able/imroved. -Treat COVID as outlined below   Febrile neutropenia ([place]) secondary to marrow suppression secondary to recent chemotherapy Suspect respiratory source given symptoms, COVID-positive, tachypnea, tachycardia. Patient not hypoxic  Inflammatory markers obtained. Patient pancultured. Initially on empiric IV vancomycin, IV cefepime, IV Flagyl, IV remdesivir. Vanc and Flagyl d/c 12/29/21  CT angiogram chest done was negative for PE. Oncology following  Repeat labs in the AM. Neutropenic precautions  COVID-19 virus infection Complicated by immunocompromise secondary to history of breast cancer currently on chemotherapy.   -Inflammatory markers ordered this morning with a LDH of 143, CRP of 3.5, procalcitonin negative, ferritin of 441 D-dimer elevated at 0.58.  -CT angiogram chest done negative. -Patient currently not hypoxic and as such we will hold off on IV steroids at this time. -Continue Remdesivir, antitussives, Combivent Airborne precautions.  Thrombocytopenia ([place]) Chemotherapy induced Platelets 90,000 SCD for DVT prophylaxis Continue to monitor  Carcinoma of upper-outer quadrant of right breast in female, estrogen receptor positive ([place]) Patient is on chemotherapy And last treatment was Tuesday, July 18 -Oncology consulted.  Diabetes mellitus ([place]) Hemoglobin A1c 6.0 (12/27/2021 ). SSI.    DVT prophylaxis: SCD Code Status: FULL Family Communication: husband  at bedside on rounds  Disposition Plan  / TOC needs: home when stable Barriers to discharge / significant pending items: hopefully can d/c next couple days pending concerns from oncology / results blood cultures              Objective: Vitals:   12/29/21 0507 12/29/21 0508 12/29/21 0741 12/29/21 1211  BP:  (!) 109/44 111/66 117/72  Pulse:  95 (!) 102 91  Resp:  18 18 17   Temp:  98.6 F (37 C) 98.1 F (36.7 C) 97.8 F (36.6 C)  TempSrc:      SpO2:  99% 100% 100%  Weight: 97.9 kg     Height: 5' 5"  (1.651 m)       Intake/Output Summary (Last 24 hours) at 12/29/2021 1320 Last data filed at 12/29/2021 1209 Gross per 24 hour  Intake 2334.16 ml  Output 0 ml  Net 2334.16 ml   Filed Weights   12/27/21 2102 12/28/21 1800 12/29/21 0507  Weight: 99.8 kg 99.6 kg 97.9 kg    Examination:  Constitutional:  VS as above General Appearance: alert, well-developed, well-nourished, NAD Eyes: Normal lids and conjunctive, non-icteric sclera Ears, Nose, Mouth, Throat: Normal appearance Neck: No masses, trachea midline Respiratory: Normal respiratory effort Breath sounds normal, no wheeze/rhonchi/rales Cardiovascular: S1/S2 normal, no murmur/rub/gallop auscultated No lower extremity edema Gastrointestinal: Nontender, no masses Musculoskeletal:  No clubbing/cyanosis of digits Neurological: No cranial nerve deficit on limited exam Psychiatric: Normal judgment/insight Normal mood and affect       Scheduled Medications:   vitamin C  500 mg Oral Daily   calcium-vitamin D  1 tablet Oral BID   ferrous sulfate  325 mg Oral BID WC   guaiFENesin  1,200 mg Oral BID   insulin aspart  0-20 Units Subcutaneous TID WC   insulin aspart  0-5 Units Subcutaneous QHS   Ipratropium-Albuterol  1 puff Inhalation Q6H   polyethylene glycol  17 g Oral Daily   senna-docusate  1 tablet Oral BID   zinc sulfate  220 mg Oral Daily    Continuous Infusions:  sodium chloride 125 mL/hr at 12/29/21 0505   ceFEPime (MAXIPIME) IV 2 g  (12/29/21 0504)   remdesivir 100 mg in sodium chloride 0.9 % 100 mL IVPB 100 mg (12/29/21 0958)    PRN Medications:  acetaminophen **OR** acetaminophen, alum & mag hydroxide-simeth, chlorpheniramine-HYDROcodone, chlorpheniramine-HYDROcodone, guaiFENesin-dextromethorphan, magic mouthwash w/lidocaine, metoprolol tartrate, ondansetron **OR** ondansetron (ZOFRAN) IV, phenol  Antimicrobials:  Anti-infectives (From admission, onward)    Start     Dose/Rate Route Frequency Ordered Stop   12/29/21 1000  remdesivir 100 mg in sodium chloride 0.9 % 100 mL IVPB       See Hyperspace for full Linked Orders Report.   100 mg 200 mL/hr over 30 Minutes Intravenous Daily 12/28/21 0110 12/31/21 0959   12/28/21 1200  vancomycin (VANCOREADY) IVPB 750 mg/150 mL  Status:  Discontinued        750 mg 150 mL/hr over 60 Minutes Intravenous Every 12 hours 12/28/21 0125 12/29/21 0757   12/28/21 0600  ceFEPIme (MAXIPIME) 2 g in sodium chloride 0.9 % 100 mL IVPB        2 g 200 mL/hr over 30 Minutes Intravenous Every 8 hours 12/28/21 0119     12/28/21 0200  metroNIDAZOLE (FLAGYL) IVPB 500 mg  Status:  Discontinued        500 mg 100 mL/hr over 60 Minutes Intravenous Every 12 hours 12/28/21 0110 12/29/21 0757   12/28/21 0200  remdesivir  200 mg in sodium chloride 0.9% 250 mL IVPB       See Hyperspace for full Linked Orders Report.   200 mg 580 mL/hr over 30 Minutes Intravenous Once 12/28/21 0110 12/28/21 0539   12/27/21 2215  vancomycin (VANCOREADY) IVPB 2000 mg/400 mL        2,000 mg 200 mL/hr over 120 Minutes Intravenous  Once 12/27/21 2158 12/28/21 0259   12/27/21 2200  ceFEPIme (MAXIPIME) 2 g in sodium chloride 0.9 % 100 mL IVPB        2 g 200 mL/hr over 30 Minutes Intravenous  Once 12/27/21 2158 12/28/21 0012       Data Reviewed: I have personally reviewed following labs and imaging studies  CBC: Recent Labs  Lab 12/27/21 2105 12/28/21 0329 12/29/21 0401  WBC 0.7* 0.7* 0.9*  NEUTROABS 0.3*  --  0.2*   HGB 11.4* 9.5* 9.3*  HCT 33.9* 28.6* 27.9*  MCV 85.4 86.9 86.9  PLT 90* 74* 89*   Basic Metabolic Panel: Recent Labs  Lab 12/27/21 2105 12/28/21 0329 12/28/21 0947 12/29/21 0401  NA 134* 137  --  138  K 3.6 3.3*  --  3.4*  CL 104 106  --  107  CO2 22 24  --  25  GLUCOSE 192* 111*  --  99  BUN 16 13  --  9  CREATININE 0.93 0.76  --  0.71  CALCIUM 8.6* 8.1*  --  8.3*  MG  --   --  1.8 2.0  PHOS  --   --   --  2.9   GFR: Estimated Creatinine Clearance: 100.7 mL/min (by C-G formula based on SCr of 0.71 mg/dL). Liver Function Tests: Recent Labs  Lab 12/27/21 2105 12/29/21 0401  AST 42* 29  ALT 51* 39  ALKPHOS 70 51  BILITOT 1.1 0.5  PROT 7.0 5.9*  ALBUMIN 3.7 3.1*   No results for input(s): "LIPASE", "AMYLASE" in the last 168 hours. No results for input(s): "AMMONIA" in the last 168 hours. Coagulation Profile: Recent Labs  Lab 12/28/21 0329  INR 1.2   Cardiac Enzymes: No results for input(s): "CKTOTAL", "CKMB", "CKMBINDEX", "TROPONINI" in the last 168 hours. BNP (last 3 results) No results for input(s): "PROBNP" in the last 8760 hours. HbA1C: Recent Labs    12/27/21 2105  HGBA1C 6.0*   CBG: Recent Labs  Lab 12/28/21 1156 12/28/21 1825 12/28/21 2053 12/29/21 0740 12/29/21 1207  GLUCAP 108* 149* 116* 106* 104*   Lipid Profile: No results for input(s): "CHOL", "HDL", "LDLCALC", "TRIG", "CHOLHDL", "LDLDIRECT" in the last 72 hours. Thyroid Function Tests: No results for input(s): "TSH", "T4TOTAL", "FREET4", "T3FREE", "THYROIDAB" in the last 72 hours. Anemia Panel: Recent Labs    12/28/21 0947 12/29/21 0401  VITAMINB12 960*  --   FOLATE 38.0  --   FERRITIN 441* 542*  TIBC 276  --   IRON 21*  --   RETICCTPCT 0.5  --    Urine analysis:    Component Value Date/Time   COLORURINE YELLOW (A) 12/27/2021 2317   APPEARANCEUR CLEAR (A) 12/27/2021 2317   LABSPEC 1.031 (H) 12/27/2021 2317   PHURINE 6.0 12/27/2021 2317   GLUCOSEU NEGATIVE 12/27/2021  2317   HGBUR NEGATIVE 12/27/2021 2317   BILIRUBINUR NEGATIVE 12/27/2021 2317   KETONESUR NEGATIVE 12/27/2021 2317   PROTEINUR NEGATIVE 12/27/2021 2317   NITRITE NEGATIVE 12/27/2021 2317   LEUKOCYTESUR SMALL (A) 12/27/2021 2317   Sepsis Labs: @LABRCNTIP (procalcitonin:4,lacticidven:4)  Recent Results (from the past 240 hour(s))  SARS Coronavirus 2 by RT PCR (hospital order, performed in [facility] hospital lab) *cepheid single result test* Anterior Nasal Swab     Status: Abnormal   Collection Time: 12/27/21  9:05 PM   Specimen: Anterior Nasal Swab  Result Value Ref Range Status   SARS Coronavirus 2 by RT PCR POSITIVE (A) NEGATIVE Final    Comment: (NOTE) SARS-CoV-2 target nucleic acids are DETECTED  SARS-CoV-2 RNA is generally detectable in upper respiratory specimens  during the acute phase of infection.  Positive results are indicative  of the presence of the identified virus, but do not rule out bacterial infection or co-infection with other pathogens not detected by the test.  Clinical correlation with patient history and  other diagnostic information is necessary to determine patient infection status.  The expected result is negative.  Fact Sheet for Patients:   https://www.patel.info/   Fact Sheet for Healthcare Providers:   https://hall.com/    This test is not yet approved or cleared by the [place] FDA and  has been authorized for detection and/or diagnosis of SARS-CoV-2 by FDA under an Emergency Use Authorization (EUA).  This EUA will remain in effect (meaning this test can be used) for the duration of  the COVID-19 declaration under Section 564(b)(1)  of the Act, 21 U.S.C. section 360-bbb-3(b)(1), unless the authorization is terminated or revoked sooner.   Performed at [facility], [address] [place], [place] [address]   Blood culture (routine x 2)     Status: None (Preliminary result)   Collection  Time: 12/27/21  9:05 PM   Specimen: BLOOD  Result Value Ref Range Status   Specimen Description BLOOD LEFT AC  Final   Special Requests   Final    BOTTLES DRAWN AEROBIC AND ANAEROBIC Blood Culture results may not be optimal due to an excessive volume of blood received in culture bottles   Culture   Final    NO GROWTH 2 DAYS Performed at [facility], [address] [place], [place] [address]    Report Status PENDING  Incomplete  Respiratory (~20 pathogens) panel by PCR     Status: None   Collection Time: 12/27/21  9:05 PM   Specimen: Nasopharyngeal Swab; Respiratory  Result Value Ref Range Status   Adenovirus NOT DETECTED NOT DETECTED Final   Coronavirus 229E NOT DETECTED NOT DETECTED Final    Comment: (NOTE) The Coronavirus on the Respiratory Panel, DOES NOT test for the novel  Coronavirus (2019 nCoV)    Coronavirus HKU1 NOT DETECTED NOT DETECTED Final   Coronavirus NL63 NOT DETECTED NOT DETECTED Final   Coronavirus OC43 NOT DETECTED NOT DETECTED Final   Metapneumovirus NOT DETECTED NOT DETECTED Final   Rhinovirus / Enterovirus NOT DETECTED NOT DETECTED Final   Influenza A NOT DETECTED NOT DETECTED Final   Influenza B NOT DETECTED NOT DETECTED Final   Parainfluenza Virus 1 NOT DETECTED NOT DETECTED Final   Parainfluenza Virus 2 NOT DETECTED NOT DETECTED Final   Parainfluenza Virus 3 NOT DETECTED NOT DETECTED Final   Parainfluenza Virus 4 NOT DETECTED NOT DETECTED Final   Respiratory Syncytial Virus NOT DETECTED NOT DETECTED Final   Bordetella pertussis NOT DETECTED NOT DETECTED Final   Bordetella Parapertussis NOT DETECTED NOT DETECTED Final   Chlamydophila pneumoniae NOT DETECTED NOT DETECTED Final   Mycoplasma pneumoniae NOT DETECTED NOT DETECTED Final    Comment: Performed at [facility], [address], [place],  [address]  Blood culture (routine x  2)     Status: None (Preliminary result)   Collection Time: 12/27/21 10:48 PM   Specimen: BLOOD   Result Value Ref Range Status   Specimen Description BLOOD RIGHT HAND  Final   Special Requests   Final    BOTTLES DRAWN AEROBIC AND ANAEROBIC Blood Culture adequate volume   Culture   Final    NO GROWTH 2 DAYS Performed at [facility], [address] [place], [place] [address]    Report Status PENDING  Incomplete  Urine Culture     Status: Abnormal   Collection Time: 12/27/21 11:17 PM   Specimen: Urine, Clean Catch  Result Value Ref Range Status   Specimen Description   Final    URINE, CLEAN CATCH Performed at [facility], [address], [place], [place] [address]    Special Requests   Final    NONE Performed at [facility], [address], [place], [place] [address]    Culture (A)  Final    <10,000 COLONIES/mL INSIGNIFICANT GROWTH Performed at [facility] [address], [place], [place] [address]    Report Status 12/29/2021 FINAL  Final  MRSA Next Gen by PCR, Nasal     Status: None   Collection Time: 12/28/21  6:30 PM   Specimen: Nasal Mucosa; Nasal Swab  Result Value Ref Range Status   MRSA by PCR Next Gen NOT DETECTED NOT DETECTED Final    Comment: (NOTE) The GeneXpert MRSA Assay (FDA approved for NASAL specimens only), is one component of a comprehensive MRSA colonization surveillance program. It is not intended to diagnose MRSA infection nor to guide or monitor treatment for MRSA infections. Test performance is not FDA approved in patients less than 60 years old. Performed at [facility], [address], [place], [place] [address]          Radiology Studies last 96 hours: CT Angio Chest Pulmonary Embolism (PE) W or WO Contrast  Result Date: 12/28/2021 CLINICAL DATA:  High clinical suspicion for PE EXAM: CT ANGIOGRAPHY CHEST WITH CONTRAST TECHNIQUE: Multidetector CT imaging of the chest was performed using the standard protocol during bolus administration of intravenous contrast. Multiplanar CT image  reconstructions and MIPs were obtained to evaluate the vascular anatomy. RADIATION DOSE REDUCTION: This exam was performed according to the departmental dose-optimization program which includes automated exposure control, adjustment of the mA and/or kV according to patient size and/or use of iterative reconstruction technique. CONTRAST:  36m OMNIPAQUE IOHEXOL 350 MG/ML SOLN COMPARISON:  Previous studies including the chest radiograph done on 12/27/2021 FINDINGS: Cardiovascular: There is homogeneous enhancement in thoracic aorta. There are no intraluminal filling defects in pulmonary artery branches. There is ectasia of the main pulmonary artery measuring 3.8 cm suggesting pulmonary arterial hypertension. Mediastinum/Nodes: There are slightly enlarged lymph nodes in mediastinum. Lungs/Pleura: There is no focal pulmonary consolidation. There is no pleural effusion or pneumothorax. Upper Abdomen: There is fatty infiltration liver. Spleen measures 14 cm in AP diameter. Musculoskeletal: No acute findings are seen. Review of the MIP images confirms the above findings. IMPRESSION: There is no evidence of pulmonary artery embolism. There is no evidence of thoracic aortic dissection. There is no focal pulmonary consolidation. Fatty liver.  Enlarged spleen. Electronically Signed   By: PElmer PickerM.D.   On: 12/28/2021 13:51   CT ABDOMEN PELVIS W CONTRAST  Result Date: 12/27/2021 CLINICAL DATA:  Abdominal pain. Patient complains of cough fever for 2 days. EXAM: CT ABDOMEN AND PELVIS WITH  CONTRAST TECHNIQUE: Multidetector CT imaging of the abdomen and pelvis was performed using the standard protocol following bolus administration of intravenous contrast. RADIATION DOSE REDUCTION: This exam was performed according to the departmental dose-optimization program which includes automated exposure control, adjustment of the mA and/or kV according to patient size and/or use of iterative reconstruction technique. CONTRAST:   133m OMNIPAQUE IOHEXOL 300 MG/ML  SOLN COMPARISON:  None Available. FINDINGS: Lower chest: No acute abnormality. Mild right breast skin thickening. Hepatobiliary: No focal liver abnormality is seen. No gallstones, gallbladder wall thickening, or biliary dilatation. Pancreas: Unremarkable. No pancreatic ductal dilatation or surrounding inflammatory changes. Spleen: Normal in size without focal abnormality. Adrenals/Urinary Tract: Mild left adrenal thickening. Kidneys are normal, without renal calculi, focal lesion, or hydronephrosis. Bladder is unremarkable. Stomach/Bowel: Stomach is within normal limits. Appendix appears normal. No evidence of bowel wall thickening, distention, or inflammatory changes. Vascular/Lymphatic: No significant vascular findings are present. No enlarged abdominal or pelvic lymph nodes. Reproductive: Mildly enlarged uterus.  No adnexal mass. Other: No abdominal wall hernia or abnormality. No abdominopelvic ascites. Musculoskeletal: Degenerate disc disease of the lumbar spine prominent at L4-L5 and L5-S1. No acute osseous abnormality. IMPRESSION: 1.  No CT evidence of acute abdominal/pelvic process. 2. Mild left adrenal nodular thickening, which likely represent small adrenal adenoma. 3. Bowel loops are normal in caliber. Normal appendix. No evidence of colitis or diverticulitis. 3. Mildly enlarged globular uterus, pelvic sonogram for further evaluation would be helpful on a nonemergent basis. 4. 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 Electronically Signed   By: IKeane PoliceD.O.   On: 12/27/2021 23:12   DG Chest 2 View  Result Date: 12/27/2021 CLINICAL DATA:  Cough, chest pain and shortness of breath. EXAM: CHEST - 2 VIEW COMPARISON:  November 09, 2021 FINDINGS: There is stable left-sided venous Port-A-Cath positioning. The heart size and mediastinal contours are within normal limits. Both lungs are clear. The visualized  skeletal structures are unremarkable. IMPRESSION: No active cardiopulmonary disease. Electronically Signed   By: TVirgina NorfolkM.D.   On: 12/27/2021 21:34            LOS: 2 days    NEmeterio Reeve DO [facility] 12/29/2021, 1:20 PM   Staff may message me via secure chat in [place] but this may not receive immediate response,  please page for urgent matters!  If 7PM-7AM, please contact night-coverage [email or web address]  Dictation software was used to generate the above note. Typos may occur and escape review, as with typed/written notes. Please contact Dr ASheppard Coildirectly for clarity if needed.

## 2021-12-29 NOTE — Progress Notes (Signed)
WBC 0.9    12/29/21 6720  Provider Notification  Provider Name/Title K. Oran Rein  Date Provider Notified 12/29/21  Time Provider Notified [phone]  Method of Notification Page  Notification Reason Critical result  Test performed and critical result WBC

## 2021-12-29 NOTE — Progress Notes (Signed)
Initial Nutrition Assessment  DOCUMENTATION CODES:   Obesity unspecified  INTERVENTION:   -Ensure Enlive po TID, each supplement provides 350 kcal and 20 grams of protein -MVI with minerals daily  NUTRITION DIAGNOSIS:   Increased nutrient needs related to cancer and cancer related treatments as evidenced by estimated needs.  GOAL:   Patient will meet greater than or equal to 90% of their needs  MONITOR:   PO intake, Supplement acceptance  REASON FOR ASSESSMENT:   Malnutrition Screening Tool    ASSESSMENT:   Pt with history of breast cancer on chemotherapy, history of gestational diabetes presented  w/ 1 day history of nonproductive cough, shortness of breath, fever as high as 102.9.  Pt admitted with febrile neutropenia and sepsis.   Reviewed I/O's: +2 L x 24 hours and +3.6 L since admission  Spoke with pt and husband at bedside. Pt reports feeling a little better today. She shares that her appetite waxes and wanes, especially 8-10 days after chemotherapy (pt receives treatment once every 3 weeks). Pt shares that after receiving chemotherapy, she experiences altered taste perception for 8-10 days after treatment, but resolves after that. Her main complaint is that foods such as bread and soups taste very salty.  Pt consumed about 1/2 cup of ice cream, broth from [place] soup, and 1/3 of banana. She reports she has been preferring sold foods and liquids.   Pt reports she has lost a few pounds over the past month. She has been trying to maintain her weight, but shares that she was recently prescribed ozempic for pre-diabetes and to facilitate weight loss. Reviewed wt hx; pt has experienced a 3.7% wt loss over the past month, which is not significant for time frame.   Discussed importance of good meal and supplement intake to promote healing. Pt has tried Ensure supplements in the past and is willing to resume them.   Medications reviewed and include miralax, sennokot, ferrous  sulfate, zinc sulfate, remdesivir, and 0.9% sodium chloride infusion @ 125 ml/hr.   Lab Results  Component Value Date   HGBA1C 6.0 (H) 12/27/2021   PTA DM medications are 0.5 mg ozempic weekly.   Labs reviewed: K: 3.4, CBGS: 104-116 (inpatient orders for glycemic control are 0-20 units insulin aspart TID with meals and 0-5 units insulin aspart daily at bedtime).    NUTRITION - FOCUSED PHYSICAL EXAM:  Flowsheet Row Most Recent Value  Orbital Region No depletion  Upper Arm Region No depletion  Thoracic and Lumbar Region No depletion  Buccal Region No depletion  Temple Region No depletion  Clavicle Bone Region No depletion  Clavicle and Acromion Bone Region No depletion  Scapular Bone Region No depletion  Dorsal Hand No depletion  Patellar Region No depletion  Anterior Thigh Region No depletion  Posterior Calf Region No depletion  Edema (RD Assessment) None  Hair Reviewed  Eyes Reviewed  Mouth Reviewed  Skin Reviewed  Nails Reviewed       Diet Order:   Diet Order             Diet Carb Modified Fluid consistency: Thin; Room service appropriate? Yes  Diet effective now                   EDUCATION NEEDS:   Education needs have been addressed  Skin:  Skin Assessment: Reviewed RN Assessment  Last BM:  12/28/21  Height:   Ht Readings from Last 1 Encounters:  12/29/21 '5\' 5"'$  (1.651 m)    Weight:  Wt Readings from Last 1 Encounters:  12/29/21 97.9 kg    Ideal Body Weight:  56.8 kg  BMI:  Body mass index is 35.92 kg/m.  Estimated Nutritional Needs:   Kcal:  1800-2000  Protein:  100-115 grams  Fluid:  > 1.8 L    Loistine Chance, RD, LDN, [place] Registered Dietitian II Certified Diabetes Care and Education Specialist Please refer to [facility] for RD and/or RD on-call/weekend/after hours pager

## 2021-12-29 NOTE — Progress Notes (Signed)
  Transition of Care [facility]) Screening Note   Patient Details  Name: GERLINE RATTO Date of Birth: 12-Aug-1975   Transition of Care [facility]) CM/SW Contact:    Alberteen Sam, LCSW Phone Number: 12/29/2021, 1:25 PM    Transition of Care Department [facility]) has reviewed patient and no TOC needs have been identified at this time. We will continue to monitor patient advancement through interdisciplinary progression rounds. If new patient transition needs arise, please place a TOC consult.  [place], [place]

## 2021-12-30 ENCOUNTER — Inpatient Hospital Stay: Payer: 59

## 2021-12-30 ENCOUNTER — Telehealth: Payer: Self-pay

## 2021-12-30 ENCOUNTER — Inpatient Hospital Stay: Payer: 59 | Admitting: Medical Oncology

## 2021-12-30 ENCOUNTER — Encounter: Payer: Self-pay | Admitting: Internal Medicine

## 2021-12-30 ENCOUNTER — Other Ambulatory Visit: Payer: 59

## 2021-12-30 DIAGNOSIS — R5081 Fever presenting with conditions classified elsewhere: Secondary | ICD-10-CM | POA: Diagnosis not present

## 2021-12-30 DIAGNOSIS — D709 Neutropenia, unspecified: Secondary | ICD-10-CM | POA: Diagnosis not present

## 2021-12-30 LAB — COMPREHENSIVE METABOLIC PANEL
ALT: 35 U/L (ref 0–44)
AST: 30 U/L (ref 15–41)
Albumin: 3 g/dL — ABNORMAL LOW (ref 3.5–5.0)
Alkaline Phosphatase: 49 U/L (ref 38–126)
Anion gap: 8 (ref 5–15)
BUN: 8 mg/dL (ref 6–20)
CO2: 25 mmol/L (ref 22–32)
Calcium: 8.3 mg/dL — ABNORMAL LOW (ref 8.9–10.3)
Chloride: 107 mmol/L (ref 98–111)
Creatinine, Ser: 0.64 mg/dL (ref 0.44–1.00)
GFR, Estimated: >60 mL/min (ref >60)
Glucose, Bld: 92 mg/dL (ref 70–99)
Potassium: 3.2 mmol/L — ABNORMAL LOW (ref 3.5–5.1)
Sodium: 140 mmol/L (ref 135–145)
Total Bilirubin: 0.3 mg/dL (ref 0.3–1.2)
Total Protein: 5.9 g/dL — ABNORMAL LOW (ref 6.5–8.1)

## 2021-12-30 LAB — CBC WITH DIFFERENTIAL/PLATELET
Abs Immature Granulocytes: 0.02 10*3/uL (ref 0.00–0.07)
Basophils Absolute: 0 10*3/uL (ref 0.0–0.1)
Basophils Relative: 1 %
Eosinophils Absolute: 0 10*3/uL (ref 0.0–0.5)
Eosinophils Relative: 1 %
HCT: 30.5 % — ABNORMAL LOW (ref 36.0–46.0)
Hemoglobin: 9.8 g/dL — ABNORMAL LOW (ref 12.0–15.0)
Immature Granulocytes: 1 %
Lymphocytes Relative: 48 %
Lymphs Abs: 1 10*3/uL (ref 0.7–4.0)
MCH: 28.2 pg (ref 26.0–34.0)
MCHC: 32.1 g/dL (ref 30.0–36.0)
MCV: 87.6 fL (ref 80.0–100.0)
Monocytes Absolute: 0.5 10*3/uL (ref 0.1–1.0)
Monocytes Relative: 24 %
Neutro Abs: 0.5 10*3/uL — ABNORMAL LOW (ref 1.7–7.7)
Neutrophils Relative %: 25 %
Platelets: 105 10*3/uL — ABNORMAL LOW (ref 150–400)
RBC: 3.48 MIL/uL — ABNORMAL LOW (ref 3.87–5.11)
RDW: 15.5 % (ref 11.5–15.5)
Smear Review: NORMAL
WBC: 2.1 10*3/uL — ABNORMAL LOW (ref 4.0–10.5)
nRBC: 0 % (ref 0.0–0.2)

## 2021-12-30 LAB — FERRITIN: Ferritin: 543 ng/mL — ABNORMAL HIGH (ref 11–307)

## 2021-12-30 LAB — PHOSPHORUS: Phosphorus: 2.9 mg/dL (ref 2.5–4.6)

## 2021-12-30 LAB — D-DIMER, QUANTITATIVE: D-Dimer, Quant: 0.48 ug/mL-FEU (ref 0.00–0.50)

## 2021-12-30 LAB — GLUCOSE, CAPILLARY: Glucose-Capillary: 103 mg/dL — ABNORMAL HIGH (ref 70–99)

## 2021-12-30 LAB — C-REACTIVE PROTEIN: CRP: 3 mg/dL — ABNORMAL HIGH (ref <1.0)

## 2021-12-30 LAB — MAGNESIUM: Magnesium: 1.8 mg/dL (ref 1.7–2.4)

## 2021-12-30 MED ORDER — ADULT MULTIVITAMIN W/MINERALS CH: 1.0000 | ORAL_TABLET | Freq: Every day | ORAL | Status: DC

## 2021-12-30 MED ORDER — ASCORBIC ACID 500 MG PO TABS
500.0000 mg | ORAL_TABLET | Freq: Every day | ORAL | Status: AC
Start: 2021-12-31 — End: ?

## 2021-12-30 MED ORDER — IPRATROPIUM-ALBUTEROL 20-100 MCG/ACT IN AERS: 1.0000 | INHALATION_SPRAY | Freq: Four times a day (QID) | RESPIRATORY_TRACT | 0 refills | Status: DC

## 2021-12-30 MED ORDER — ENSURE ENLIVE PO LIQD: 237.0000 mL | Freq: Three times a day (TID) | ORAL | 12 refills | Status: DC

## 2021-12-30 MED ORDER — ACETAMINOPHEN 325 MG PO TABS: 650.0000 mg | ORAL_TABLET | Freq: Four times a day (QID) | ORAL | Status: AC | PRN

## 2021-12-30 MED ORDER — LOPERAMIDE HCL 2 MG PO CAPS
2.0000 mg | ORAL_CAPSULE | ORAL | Status: DC | PRN
  Administered 2021-12-30: 2 mg via ORAL
  Filled 2021-12-30: qty 1

## 2021-12-30 MED ORDER — ZINC SULFATE 220 (50 ZN) MG PO CAPS: 220.0000 mg | ORAL_CAPSULE | Freq: Every day | ORAL | Status: DC

## 2021-12-30 MED ORDER — LOPERAMIDE HCL 2 MG PO CAPS
4.0000 mg | ORAL_CAPSULE | Freq: Once | ORAL | Status: DC
Start: 2021-12-30 — End: 2021-12-30

## 2021-12-30 MED ORDER — POTASSIUM CHLORIDE CRYS ER 20 MEQ PO TBCR: 40.0000 meq | EXTENDED_RELEASE_TABLET | Freq: Once | ORAL | Status: DC

## 2021-12-30 MED ORDER — PHENOL 1.4 % MT LIQD
1.0000 | OROMUCOSAL | 0 refills | Status: DC | PRN
Start: 2021-12-30 — End: 2022-03-07

## 2021-12-30 MED ORDER — LOPERAMIDE HCL 2 MG PO CAPS: 2.0000 mg | ORAL_CAPSULE | ORAL | 0 refills | Status: DC | PRN

## 2021-12-30 MED ORDER — ALUM & MAG HYDROXIDE-SIMETH 200-200-20 MG/5ML PO SUSP: 30.0000 mL | ORAL | 0 refills | Status: DC | PRN

## 2021-12-30 MED ORDER — HYDROCOD POLI-CHLORPHE POLI ER 10-8 MG/5ML PO SUER: 5.0000 mL | Freq: Two times a day (BID) | ORAL | 0 refills | Status: DC | PRN

## 2021-12-30 MED ORDER — POTASSIUM CHLORIDE CRYS ER 20 MEQ PO TBCR
60.0000 meq | EXTENDED_RELEASE_TABLET | Freq: Once | ORAL | Status: AC
  Administered 2021-12-30: 60 meq via ORAL
  Filled 2021-12-30: qty 3

## 2021-12-30 MED ORDER — GUAIFENESIN ER 600 MG PO TB12: 1200.0000 mg | ORAL_TABLET | Freq: Two times a day (BID) | ORAL | Status: DC

## 2021-12-30 NOTE — Telephone Encounter (Signed)
Message received from hospitalist to on call CC MD, Dr. Tasia Catchings:   Pt will be completing 3 days remdesevir, WBC count coming up, she's feeling better and SpO2 good on RA - any objections to me discharging her today?  Dr. Tasia Catchings would like patient to have cbc on 7/28, and post hosp follow up with Dr.B next week.

## 2021-12-30 NOTE — Telephone Encounter (Signed)
Please schedule as Dr. Tasia Catchings recommends and advise pt of appt details.  OK to schedule with APP.

## 2021-12-30 NOTE — Discharge Summary (Signed)
Physician Discharge Summary   Patient: Marilyn Page MRN: [id number]  DOB: 11-19-1975   Admit:     Date of Admission: 12/27/2021 Admitted from: home   Discharge: Date of discharge: 12/30/21 Disposition: Home Condition at discharge: good  CODE STATUS: FULL     Discharge Physician: Emeterio Reeve, DO [facility]     PCP: Latanya Maudlin, NP  Recommendations for Outpatient Follow-up:  Follow up with PCP Latanya Maudlin, NP in 2-4 weeks Please follow as directed w/ oncology team for visit and repeat labs - they will call with appointmetn but if you do not hear anythign from them in the next few days please call them to confirm.  Please obtain labs/tests: CBC in 1 week w/ Oncology Please follow up on the following pending results: none   Discharge Instructions     Diet - low sodium heart healthy   Complete by: As directed    Discharge instructions   Complete by: As directed    Antivirals and antibiotics were completed in the hospital.   Symptom care for COVID-19: see instructions and medication list  Prescriptions sent for... 1) cough medication 2) inhaler   Increase activity slowly   Complete by: As directed           Brief/Interim Summary: Patient 46 year old female history of breast cancer on chemotherapy, history of gestational diabetes presented to the ED 12/27/21 w/ 1 day history of nonproductive cough, shortness of breath, fever as high as 102.9.  Has been noted to have had a similar cough recently.  Patient did endorse some constipation and seeing blood mixed in stool when having bowel movement.  No nausea vomiting or dysuria.   07/23 Patient seen in the ED noted to have a temperature of 100.4, tachycardic with heart rate as high as 145, tachypneic with respiratory rate of 28 with sats of 98% on room air. Meeting sepsis criteria. Blood pressure noted at 107/72.  Patient noted to be profoundly neutropenic with a white count of 0.7, lactic  acid of 2.1, COVID-19 PCR positive.  Thrombocytopenia with a platelet kind of 90, hemoglobin of 11.4.  Slight transaminitis.  Respiratory viral panel negative.  Chest x-ray with no active disease.  CT abdomen and pelvis with no acute abnormalities.  Patient pancultured, placed on broad-spectrum antibiotics of IV vancomycin, cefepime, Flagyl.  Patient also started on remdesivir.  Oncology consulted 07/24: Oncology saw patient. Advised continue cefepime and vancomycin.  07/25: BCx no growth, continue respiratory coverage w/ cefepime, remdesevir, and d/c vanc. Not needing O2. Pt reports feeling overall improved compared to few days ago other than sore throat/cough. 07/26: remains stable on RA, WBC improving, spoke w/ oncology and no concerns w/ discharge today. No serious suspicion for bacterial pneumonia and pt has developed loose stools on abx and would liek to stop these and I think this is reasonable, She completed 3 days remdesevir, discharged home w/ symptomatic treatment to follow w/ PCP and Oncology    Consultants:  Oncology  Procedures:  None      Discharge Diagnoses: Principal Problem:   Febrile neutropenia ([place]) Active Problems:   Sepsis ([place])   COVID-19 virus infection   Carcinoma of upper-outer quadrant of right breast in female, estrogen receptor positive ([place])   Thrombocytopenia ([place])   Diabetes mellitus ([place])    Assessment & Plan: Sepsis ([place]) Most likely d/t [id number] Complicated by neutropenia d/t cancer treatment - Patient met criteria for sepsis on admission with fever, tachycardia, tachypnea,  neutropenia, COVID-19 PCR positive.   -Source is likely respiratory and related to COVID infection, chest x-ray is clear. -Respiratory viral panel negative. -abx deescalated and discontinued altogether -Patient received boluses of IV fluids on presentation. -Treat COVID as outlined below   Febrile neutropenia ([place]) - improved/resolved secondary to marrow suppression  secondary to recent chemotherapy Suspect respiratory source given symptoms, COVID-positive, tachypnea, tachycardia. Patient not hypoxic  Inflammatory markers obtained. Patient pancultured - NGTD Initially on empiric IV vancomycin, IV cefepime, IV Flagyl, IV remdesivir. Vanc and Flagyl d/c 12/29/21, cefepime d/c 12/30/21  CT angiogram chest done was negative for PE. Oncology following  Neutropenic precautions  COVID-19 virus infection Complicated by immunocompromise secondary to history of breast cancer currently on chemotherapy.   -Inflammatory markers ordered this morning with a LDH of 143, CRP of 3.5, procalcitonin negative, ferritin of 441 D-dimer elevated at 0.58.  -CT angiogram chest done negative. -Patient currently not hypoxic and as such we will hold off on IV steroids at this time. -completed Remdesivir, discharge on antitussives, Combivent Airborne precautions.  Thrombocytopenia ([place]) Chemotherapy induced Platelets 90,000 SCD for DVT prophylaxis Continue to monitor  Carcinoma of upper-outer quadrant of right breast in female, estrogen receptor positive ([place]) Patient is on chemotherapy And last treatment was Tuesday, July 18 -Oncology following .  Diabetes mellitus ([place]) Hemoglobin A1c 6.0 (12/27/2021 ). SSI.      Discharge Instructions  Allergies as of 12/30/2021       Reactions   Metformin Other (See Comments)        Medication List     STOP taking these medications    ciprofloxacin 500 MG tablet Commonly known as: Cipro   ondansetron 8 MG disintegrating tablet Commonly known as: ZOFRAN-ODT       TAKE these medications    acetaminophen 325 MG tablet Commonly known as: TYLENOL Take 2 tablets (650 mg total) by mouth every 6 (six) hours as needed for mild pain (or Fever >/= 101).   alum & mag hydroxide-simeth 200-200-20 MG/5ML suspension Commonly known as: MAALOX/MYLANTA Take 30 mLs by mouth every 4 (four) hours as needed for indigestion.    ascorbic acid 500 MG tablet Commonly known as: VITAMIN C Take 1 tablet (500 mg total) by mouth daily. Start taking on: December 31, 2021   calcium-vitamin D 500-5 MG-MCG tablet Commonly known as: OSCAL WITH D Take 1 tablet by mouth 2 (two) times daily.   chlorpheniramine-HYDROcodone 10-8 MG/5ML Take 5 mLs by mouth every 12 (twelve) hours as needed for cough.   dexamethasone 4 MG tablet Commonly known as: DECADRON One pill twice a day - Start the day prior to chemo; do not take the day of chemo. What changed:  how much to take how to take this when to take this additional instructions   feeding supplement Liqd Take 237 mLs by mouth 3 (three) times daily between meals.   ferrous sulfate 325 (65 FE) MG tablet Take 325 mg by mouth 2 (two) times daily with a meal.   guaiFENesin 600 MG 12 hr tablet Commonly known as: MUCINEX Take 2 tablets (1,200 mg total) by mouth 2 (two) times daily.   Ipratropium-Albuterol 20-100 MCG/ACT Aers respimat Commonly known as: COMBIVENT Inhale 1 puff into the lungs every 6 (six) hours.   lidocaine-prilocaine cream Commonly known as: EMLA Apply on the port. 30 -45 min  prior to port access.   loperamide 2 MG capsule Commonly known as: IMODIUM Take 1 capsule (2 mg total) by mouth as needed for  diarrhea or loose stools.   magic mouthwash w/lidocaine Soln Take 5 mLs by mouth 4 (four) times daily as needed for mouth pain. Sig: Swish/Swallow 5-10 ml four times a day as needed. Dispense 480 ml. 1RF   multivitamin with minerals Tabs tablet Take 1 tablet by mouth daily. Start taking on: December 31, 2021   norethindrone 0.35 MG tablet Commonly known as: MICRONOR Take 1 tablet (0.35 mg total) by mouth daily.   OneTouch Verio test strip Generic drug: glucose blood SMARTSIG:1 Each Via Meter Daily PRN   phenol 1.4 % Liqd Commonly known as: CHLORASEPTIC Use as directed 1 spray in the mouth or throat as needed for throat irritation / pain.    prochlorperazine 10 MG tablet Commonly known as: COMPAZINE Take 1 tablet (10 mg total) by mouth every 6 (six) hours as needed for nausea or vomiting.   Semaglutide (1 MG/DOSE) 4 MG/3ML Sopn Inject into the skin. Inject 1 mg (0.75 ml) subcutaneously once a week. What changed: Another medication with the same name was removed. Continue taking this medication, and follow the directions you see here.   zinc sulfate 220 (50 Zn) MG capsule Take 1 capsule (220 mg total) by mouth daily. Start taking on: December 31, 2021          Allergies  Allergen Reactions   Metformin Other (See Comments)     Subjective: pt feeling well and no concerns for discharge, still coughing and sore throat but manageable w/ medications. No fever.    Discharge Exam: BP (!) 104/52 (BP Location: Right Arm)   Pulse 93   Temp 98.1 F (36.7 C)   Resp 20   Ht 5' 5"  (1.651 m)   Wt 97.9 kg   SpO2 97%   BMI 35.92 kg/m  General: Pt is alert, awake, not in acute distress Cardiovascular: RRR, S1/S2 +, no rubs, no gallops Respiratory: CTA bilaterally, no wheezing, no rhonchi Abdominal: Soft, NT, ND, bowel sounds + Extremities: no edema, no cyanosis     The results of significant diagnostics from this hospitalization (including imaging, microbiology, ancillary and laboratory) are listed below for reference.     Microbiology: Recent Results (from the past 240 hour(s))  SARS Coronavirus 2 by RT PCR (hospital order, performed in [facility] hospital lab) *cepheid single result test* Anterior Nasal Swab     Status: Abnormal   Collection Time: 12/27/21  9:05 PM   Specimen: Anterior Nasal Swab  Result Value Ref Range Status   SARS Coronavirus 2 by RT PCR POSITIVE (A) NEGATIVE Final    Comment: (NOTE) SARS-CoV-2 target nucleic acids are DETECTED  SARS-CoV-2 RNA is generally detectable in upper respiratory specimens  during the acute phase of infection.  Positive results are indicative  of the presence of the  identified virus, but do not rule out bacterial infection or co-infection with other pathogens not detected by the test.  Clinical correlation with patient history and  other diagnostic information is necessary to determine patient infection status.  The expected result is negative.  Fact Sheet for Patients:   https://www.patel.info/   Fact Sheet for Healthcare Providers:   https://hall.com/    This test is not yet approved or cleared by the [place] FDA and  has been authorized for detection and/or diagnosis of SARS-CoV-2 by FDA under an Emergency Use Authorization (EUA).  This EUA will remain in effect (meaning this test can be used) for the duration of  the COVID-19 declaration under Section 564(b)(1)  of the  Act, 21 U.S.C. section 360-bbb-3(b)(1), unless the authorization is terminated or revoked sooner.   Performed at [facility], [address] [place], [place] [address]   Blood culture (routine x 2)     Status: None (Preliminary result)   Collection Time: 12/27/21  9:05 PM   Specimen: BLOOD  Result Value Ref Range Status   Specimen Description BLOOD LEFT AC  Final   Special Requests   Final    BOTTLES DRAWN AEROBIC AND ANAEROBIC Blood Culture results may not be optimal due to an excessive volume of blood received in culture bottles   Culture   Final    NO GROWTH 3 DAYS Performed at [facility], [address] [place], [place] [address]    Report Status PENDING  Incomplete  Respiratory (~20 pathogens) panel by PCR     Status: None   Collection Time: 12/27/21  9:05 PM   Specimen: Nasopharyngeal Swab; Respiratory  Result Value Ref Range Status   Adenovirus NOT DETECTED NOT DETECTED Final   Coronavirus 229E NOT DETECTED NOT DETECTED Final    Comment: (NOTE) The Coronavirus on the Respiratory Panel, DOES NOT test for the novel  Coronavirus (2019 nCoV)    Coronavirus HKU1 NOT DETECTED NOT DETECTED  Final   Coronavirus NL63 NOT DETECTED NOT DETECTED Final   Coronavirus OC43 NOT DETECTED NOT DETECTED Final   Metapneumovirus NOT DETECTED NOT DETECTED Final   Rhinovirus / Enterovirus NOT DETECTED NOT DETECTED Final   Influenza A NOT DETECTED NOT DETECTED Final   Influenza B NOT DETECTED NOT DETECTED Final   Parainfluenza Virus 1 NOT DETECTED NOT DETECTED Final   Parainfluenza Virus 2 NOT DETECTED NOT DETECTED Final   Parainfluenza Virus 3 NOT DETECTED NOT DETECTED Final   Parainfluenza Virus 4 NOT DETECTED NOT DETECTED Final   Respiratory Syncytial Virus NOT DETECTED NOT DETECTED Final   Bordetella pertussis NOT DETECTED NOT DETECTED Final   Bordetella Parapertussis NOT DETECTED NOT DETECTED Final   Chlamydophila pneumoniae NOT DETECTED NOT DETECTED Final   Mycoplasma pneumoniae NOT DETECTED NOT DETECTED Final    Comment: Performed at [facility], [address], [place], [place] [address]  Blood culture (routine x 2)     Status: None (Preliminary result)   Collection Time: 12/27/21 10:48 PM   Specimen: BLOOD  Result Value Ref Range Status   Specimen Description BLOOD RIGHT HAND  Final   Special Requests   Final    BOTTLES DRAWN AEROBIC AND ANAEROBIC Blood Culture adequate volume   Culture   Final    NO GROWTH 3 DAYS Performed at [facility], [address] [place], [place] [address]    Report Status PENDING  Incomplete  Urine Culture     Status: Abnormal   Collection Time: 12/27/21 11:17 PM   Specimen: Urine, Clean Catch  Result Value Ref Range Status   Specimen Description   Final    URINE, CLEAN CATCH Performed at [facility], [address] [place], [place] [address]    Special Requests   Final    NONE Performed at [facility], [address] [place], [place] [address]    Culture (A)  Final    <10,000 COLONIES/mL INSIGNIFICANT GROWTH Performed at [facility], [place] [address], [place],  [address]    Report  Status 12/29/2021 FINAL  Final  MRSA Next Gen by PCR, Nasal     Status: None   Collection Time: 12/28/21  6:30  PM   Specimen: Nasal Mucosa; Nasal Swab  Result Value Ref Range Status   MRSA by PCR Next Gen NOT DETECTED NOT DETECTED Final    Comment: (NOTE) The GeneXpert MRSA Assay (FDA approved for NASAL specimens only), is one component of a comprehensive MRSA colonization surveillance program. It is not intended to diagnose MRSA infection nor to guide or monitor treatment for MRSA infections. Test performance is not FDA approved in patients less than 30 years old. Performed at [facility], [address] [place], [place] [address]      Labs: BNP (last 3 results) No results for input(s): "BNP" in the last 8760 hours. Basic Metabolic Panel: Recent Labs  Lab 12/27/21 2105 12/28/21 0329 12/28/21 0947 12/29/21 0401 12/30/21 0501  NA 134* 137  --  138 140  K 3.6 3.3*  --  3.4* 3.2*  CL 104 106  --  107 107  CO2 22 24  --  25 25  GLUCOSE 192* 111*  --  99 92  BUN 16 13  --  9 8  CREATININE 0.93 0.76  --  0.71 0.64  CALCIUM 8.6* 8.1*  --  8.3* 8.3*  MG  --   --  1.8 2.0 1.8  PHOS  --   --   --  2.9 2.9   Liver Function Tests: Recent Labs  Lab 12/27/21 2105 12/29/21 0401 12/30/21 0501  AST 42* 29 30  ALT 51* 39 35  ALKPHOS 70 51 49  BILITOT 1.1 0.5 0.3  PROT 7.0 5.9* 5.9*  ALBUMIN 3.7 3.1* 3.0*   No results for input(s): "LIPASE", "AMYLASE" in the last 168 hours. No results for input(s): "AMMONIA" in the last 168 hours. CBC: Recent Labs  Lab 12/27/21 2105 12/28/21 0329 12/29/21 0401 12/30/21 0501  WBC 0.7* 0.7* 0.9* 2.1*  NEUTROABS 0.3*  --  0.2* 0.5*  HGB 11.4* 9.5* 9.3* 9.8*  HCT 33.9* 28.6* 27.9* 30.5*  MCV 85.4 86.9 86.9 87.6  PLT 90* 74* 89* 105*   Cardiac Enzymes: No results for input(s): "CKTOTAL", "CKMB", "CKMBINDEX", "TROPONINI" in the last 168 hours. BNP: Invalid input(s): "POCBNP" CBG: Recent Labs  Lab 12/29/21 0740  12/29/21 1207 12/29/21 1708 12/29/21 2103 12/30/21 0720  GLUCAP 106* 104* 113* 109* 103*   D-Dimer Recent Labs    12/29/21 0401 12/30/21 0501  DDIMER 0.63* 0.48   Hgb A1c Recent Labs    12/27/21 2105  HGBA1C 6.0*   Lipid Profile No results for input(s): "CHOL", "HDL", "LDLCALC", "TRIG", "CHOLHDL", "LDLDIRECT" in the last 72 hours. Thyroid function studies No results for input(s): "TSH", "T4TOTAL", "T3FREE", "THYROIDAB" in the last 72 hours.  Invalid input(s): "FREET3" Anemia work up Recent Labs    12/28/21 0947 12/29/21 0401 12/30/21 0501  VITAMINB12 960*  --   --   FOLATE 38.0  --   --   FERRITIN 441* 542* 543*  TIBC 276  --   --   IRON 21*  --   --   RETICCTPCT 0.5  --   --    Urinalysis    Component Value Date/Time   COLORURINE YELLOW (A) 12/27/2021 2317   APPEARANCEUR CLEAR (A) 12/27/2021 2317   LABSPEC 1.031 (H) 12/27/2021 2317   PHURINE 6.0 12/27/2021 2317   GLUCOSEU NEGATIVE 12/27/2021 2317   HGBUR NEGATIVE 12/27/2021 2317   BILIRUBINUR NEGATIVE 12/27/2021 2317   KETONESUR NEGATIVE 12/27/2021 2317   PROTEINUR NEGATIVE 12/27/2021 2317   NITRITE NEGATIVE 12/27/2021 2317   LEUKOCYTESUR SMALL (A) 12/27/2021  [place]  Lab 12/27/21 2105 12/28/21 0329 12/29/21 0401 12/30/21 0501  WBC 0.7* 0.7* 0.9* 2.1*   Microbiology Recent Results (from the past 240 hour(s))  SARS Coronavirus 2 by RT PCR (hospital order, performed in [facility] hospital lab) *cepheid single result test* Anterior Nasal Swab     Status: Abnormal   Collection Time: 12/27/21  9:05 PM   Specimen: Anterior Nasal Swab  Result Value Ref Range Status   SARS Coronavirus 2 by RT PCR POSITIVE (A) NEGATIVE Final    Comment: (NOTE) SARS-CoV-2 target nucleic acids are DETECTED  SARS-CoV-2 RNA is generally detectable in upper respiratory specimens  during the acute phase of infection.  Positive results are indicative  of the presence of the identified virus, but do  not rule out bacterial infection or co-infection with other pathogens not detected by the test.  Clinical correlation with patient history and  other diagnostic information is necessary to determine patient infection status.  The expected result is negative.  Fact Sheet for Patients:   https://www.patel.info/   Fact Sheet for Healthcare Providers:   https://hall.com/    This test is not yet approved or cleared by the [place] FDA and  has been authorized for detection and/or diagnosis of SARS-CoV-2 by FDA under an Emergency Use Authorization (EUA).  This EUA will remain in effect (meaning this test can be used) for the duration of  the COVID-19 declaration under Section 564(b)(1)  of the Act, 21 U.S.C. section 360-bbb-3(b)(1), unless the authorization is terminated or revoked sooner.   Performed at [facility], [address] [place], [place] [address]   Blood culture (routine x 2)     Status: None (Preliminary result)   Collection Time: 12/27/21  9:05 PM   Specimen: BLOOD  Result Value Ref Range Status   Specimen Description BLOOD LEFT AC  Final   Special Requests   Final    BOTTLES DRAWN AEROBIC AND ANAEROBIC Blood Culture results may not be optimal due to an excessive volume of blood received in culture bottles   Culture   Final    NO GROWTH 3 DAYS Performed at [facility], [address] [place], [place] [address]    Report Status PENDING  Incomplete  Respiratory (~20 pathogens) panel by PCR     Status: None   Collection Time: 12/27/21  9:05 PM   Specimen: Nasopharyngeal Swab; Respiratory  Result Value Ref Range Status   Adenovirus NOT DETECTED NOT DETECTED Final   Coronavirus 229E NOT DETECTED NOT DETECTED Final    Comment: (NOTE) The Coronavirus on the Respiratory Panel, DOES NOT test for the novel  Coronavirus (2019 nCoV)    Coronavirus HKU1 NOT DETECTED NOT DETECTED Final   Coronavirus NL63  NOT DETECTED NOT DETECTED Final   Coronavirus OC43 NOT DETECTED NOT DETECTED Final   Metapneumovirus NOT DETECTED NOT DETECTED Final   Rhinovirus / Enterovirus NOT DETECTED NOT DETECTED Final   Influenza A NOT DETECTED NOT DETECTED Final   Influenza B NOT DETECTED NOT DETECTED Final   Parainfluenza Virus 1 NOT DETECTED NOT DETECTED Final   Parainfluenza Virus 2 NOT DETECTED NOT DETECTED Final   Parainfluenza Virus 3 NOT DETECTED NOT DETECTED Final   Parainfluenza Virus 4 NOT DETECTED NOT DETECTED Final   Respiratory Syncytial Virus NOT DETECTED NOT DETECTED Final   Bordetella pertussis NOT DETECTED NOT DETECTED Final   Bordetella Parapertussis NOT DETECTED NOT DETECTED Final   Chlamydophila pneumoniae NOT  DETECTED NOT DETECTED Final   Mycoplasma pneumoniae NOT DETECTED NOT DETECTED Final    Comment: Performed at [facility], [place] [address] [place], [place] [address]  Blood culture (routine x 2)     Status: None (Preliminary result)   Collection Time: 12/27/21 10:48 PM   Specimen: BLOOD  Result Value Ref Range Status   Specimen Description BLOOD RIGHT HAND  Final   Special Requests   Final    BOTTLES DRAWN AEROBIC AND ANAEROBIC Blood Culture adequate volume   Culture   Final    NO GROWTH 3 DAYS Performed at [facility], [address] [place], [place] [address]    Report Status PENDING  Incomplete  Urine Culture     Status: Abnormal   Collection Time: 12/27/21 11:17 PM   Specimen: Urine, Clean Catch  Result Value Ref Range Status   Specimen Description   Final    URINE, CLEAN CATCH Performed at [facility], [address] [place], [place] [address]    Special Requests   Final    NONE Performed at [facility], [address], [place], [place] [address]    Culture (A)  Final    <10,000 COLONIES/mL INSIGNIFICANT GROWTH Performed at [facility], [place] [address] [place], [place] [address]    Report Status 12/29/2021 FINAL  Final   MRSA Next Gen by PCR, Nasal     Status: None   Collection Time: 12/28/21  6:30 PM   Specimen: Nasal Mucosa; Nasal Swab  Result Value Ref Range Status   MRSA by PCR Next Gen NOT DETECTED NOT DETECTED Final    Comment: (NOTE) The GeneXpert MRSA Assay (FDA approved for NASAL specimens only), is one component of a comprehensive MRSA colonization surveillance program. It is not intended to diagnose MRSA infection nor to guide or monitor treatment for MRSA infections. Test performance is not FDA approved in patients less than 70 years old. Performed at [facility], [place], [place] [address]    Imaging CT Angio Chest Pulmonary Embolism (PE) W or WO Contrast  Result Date: 12/28/2021 CLINICAL DATA:  High clinical suspicion for PE EXAM: CT ANGIOGRAPHY CHEST WITH CONTRAST TECHNIQUE: Multidetector CT imaging of the chest was performed using the standard protocol during bolus administration of intravenous contrast. Multiplanar CT image reconstructions and MIPs were obtained to evaluate the vascular anatomy. RADIATION DOSE REDUCTION: This exam was performed according to the departmental dose-optimization program which includes automated exposure control, adjustment of the mA and/or kV according to patient size and/or use of iterative reconstruction technique. CONTRAST:  83m OMNIPAQUE IOHEXOL 350 MG/ML SOLN COMPARISON:  Previous studies including the chest radiograph done on 12/27/2021 FINDINGS: Cardiovascular: There is homogeneous enhancement in thoracic aorta. There are no intraluminal filling defects in pulmonary artery branches. There is ectasia of the main pulmonary artery measuring 3.8 cm suggesting pulmonary arterial hypertension. Mediastinum/Nodes: There are slightly enlarged lymph nodes in mediastinum. Lungs/Pleura: There is no focal pulmonary consolidation. There is no pleural effusion or pneumothorax. Upper Abdomen: There is fatty infiltration liver. Spleen measures 14 cm  in AP diameter. Musculoskeletal: No acute findings are seen. Review of the MIP images confirms the above findings. IMPRESSION: There is no evidence of pulmonary artery embolism. There is no evidence of thoracic aortic dissection. There is no focal pulmonary consolidation. Fatty liver.  Enlarged spleen. Electronically Signed   By: PElmer PickerM.D.   On: 12/28/2021 13:51   CT ABDOMEN PELVIS  W CONTRAST  Result Date: 12/27/2021 CLINICAL DATA:  Abdominal pain. Patient complains of cough fever for 2 days. EXAM: CT ABDOMEN AND PELVIS WITH CONTRAST TECHNIQUE: Multidetector CT imaging of the abdomen and pelvis was performed using the standard protocol following bolus administration of intravenous contrast. RADIATION DOSE REDUCTION: This exam was performed according to the departmental dose-optimization program which includes automated exposure control, adjustment of the mA and/or kV according to patient size and/or use of iterative reconstruction technique. CONTRAST:  150m OMNIPAQUE IOHEXOL 300 MG/ML  SOLN COMPARISON:  None Available. FINDINGS: Lower chest: No acute abnormality. Mild right breast skin thickening. Hepatobiliary: No focal liver abnormality is seen. No gallstones, gallbladder wall thickening, or biliary dilatation. Pancreas: Unremarkable. No pancreatic ductal dilatation or surrounding inflammatory changes. Spleen: Normal in size without focal abnormality. Adrenals/Urinary Tract: Mild left adrenal thickening. Kidneys are normal, without renal calculi, focal lesion, or hydronephrosis. Bladder is unremarkable. Stomach/Bowel: Stomach is within normal limits. Appendix appears normal. No evidence of bowel wall thickening, distention, or inflammatory changes. Vascular/Lymphatic: No significant vascular findings are present. No enlarged abdominal or pelvic lymph nodes. Reproductive: Mildly enlarged uterus.  No adnexal mass. Other: No abdominal wall hernia or abnormality. No abdominopelvic ascites.  Musculoskeletal: Degenerate disc disease of the lumbar spine prominent at L4-L5 and L5-S1. No acute osseous abnormality. IMPRESSION: 1.  No CT evidence of acute abdominal/pelvic process. 2. Mild left adrenal nodular thickening, which likely represent small adrenal adenoma. 3. Bowel loops are normal in caliber. Normal appendix. No evidence of colitis or diverticulitis. 3. Mildly enlarged globular uterus, pelvic sonogram for further evaluation would be helpful on a nonemergent basis. 4. 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 Electronically Signed   By: IKeane PoliceD.O.   On: 12/27/2021 23:12   DG Chest 2 View  Result Date: 12/27/2021 CLINICAL DATA:  Cough, chest pain and shortness of breath. EXAM: CHEST - 2 VIEW COMPARISON:  November 09, 2021 FINDINGS: There is stable left-sided venous Port-A-Cath positioning. The heart size and mediastinal contours are within normal limits. Both lungs are clear. The visualized skeletal structures are unremarkable. IMPRESSION: No active cardiopulmonary disease. Electronically Signed   By: TVirgina NorfolkM.D.   On: 12/27/2021 21:34      Time coordinating discharge: Over 30 minutes  SIGNED:  NEmeterio ReeveDO [facility]

## 2021-12-31 ENCOUNTER — Ambulatory Visit: Payer: 59 | Admitting: Medical Oncology

## 2021-12-31 ENCOUNTER — Ambulatory Visit: Payer: 59

## 2022-01-01 ENCOUNTER — Inpatient Hospital Stay: Payer: 59

## 2022-01-01 DIAGNOSIS — Z5112 Encounter for antineoplastic immunotherapy: Secondary | ICD-10-CM | POA: Diagnosis not present

## 2022-01-01 DIAGNOSIS — Z170 Estrogen receptor positive status [ER+]: Secondary | ICD-10-CM

## 2022-01-01 LAB — BASIC METABOLIC PANEL
Anion gap: 6 (ref 5–15)
BUN: 8 mg/dL (ref 6–20)
CO2: 29 mmol/L (ref 22–32)
Calcium: 8.4 mg/dL — ABNORMAL LOW (ref 8.9–10.3)
Chloride: 104 mmol/L (ref 98–111)
Creatinine, Ser: 0.64 mg/dL (ref 0.44–1.00)
GFR, Estimated: >60 mL/min (ref >60)
Glucose, Bld: 108 mg/dL — ABNORMAL HIGH (ref 70–99)
Potassium: 3.3 mmol/L — ABNORMAL LOW (ref 3.5–5.1)
Sodium: 139 mmol/L (ref 135–145)

## 2022-01-01 LAB — CULTURE, BLOOD (ROUTINE X 2)
Culture: NO GROWTH
Culture: NO GROWTH
Special Requests: ADEQUATE

## 2022-01-01 LAB — CBC WITH DIFFERENTIAL/PLATELET
Abs Immature Granulocytes: 0.1 10*3/uL — ABNORMAL HIGH (ref 0.00–0.07)
Basophils Absolute: 0 10*3/uL (ref 0.0–0.1)
Basophils Relative: 1 %
Eosinophils Absolute: 0 10*3/uL (ref 0.0–0.5)
Eosinophils Relative: 1 %
HCT: 31.6 % — ABNORMAL LOW (ref 36.0–46.0)
Hemoglobin: 10.5 g/dL — ABNORMAL LOW (ref 12.0–15.0)
Immature Granulocytes: 3 %
Lymphocytes Relative: 28 %
Lymphs Abs: 1.1 10*3/uL (ref 0.7–4.0)
MCH: 29.1 pg (ref 26.0–34.0)
MCHC: 33.2 g/dL (ref 30.0–36.0)
MCV: 87.5 fL (ref 80.0–100.0)
Monocytes Absolute: 0.4 10*3/uL (ref 0.1–1.0)
Monocytes Relative: 11 %
Neutro Abs: 2.3 10*3/uL (ref 1.7–7.7)
Neutrophils Relative %: 56 %
Platelets: 115 10*3/uL — ABNORMAL LOW (ref 150–400)
RBC: 3.61 MIL/uL — ABNORMAL LOW (ref 3.87–5.11)
RDW: 15.9 % — ABNORMAL HIGH (ref 11.5–15.5)
Smear Review: NORMAL
WBC: 4 10*3/uL (ref 4.0–10.5)
nRBC: 0 % (ref 0.0–0.2)

## 2022-01-06 ENCOUNTER — Encounter: Payer: Self-pay | Admitting: Internal Medicine

## 2022-01-06 ENCOUNTER — Inpatient Hospital Stay: Payer: 59 | Attending: Internal Medicine | Admitting: Internal Medicine

## 2022-01-06 ENCOUNTER — Ambulatory Visit: Payer: 59 | Admitting: Internal Medicine

## 2022-01-06 DIAGNOSIS — R634 Abnormal weight loss: Secondary | ICD-10-CM | POA: Insufficient documentation

## 2022-01-06 DIAGNOSIS — K1230 Oral mucositis (ulcerative), unspecified: Secondary | ICD-10-CM | POA: Insufficient documentation

## 2022-01-06 DIAGNOSIS — R609 Edema, unspecified: Secondary | ICD-10-CM | POA: Diagnosis not present

## 2022-01-06 DIAGNOSIS — I959 Hypotension, unspecified: Secondary | ICD-10-CM | POA: Diagnosis not present

## 2022-01-06 DIAGNOSIS — Z79899 Other long term (current) drug therapy: Secondary | ICD-10-CM | POA: Diagnosis not present

## 2022-01-06 DIAGNOSIS — C50411 Malignant neoplasm of upper-outer quadrant of right female breast: Secondary | ICD-10-CM | POA: Diagnosis present

## 2022-01-06 DIAGNOSIS — Z5189 Encounter for other specified aftercare: Secondary | ICD-10-CM | POA: Insufficient documentation

## 2022-01-06 DIAGNOSIS — Z5112 Encounter for antineoplastic immunotherapy: Secondary | ICD-10-CM | POA: Diagnosis present

## 2022-01-06 DIAGNOSIS — E860 Dehydration: Secondary | ICD-10-CM | POA: Diagnosis not present

## 2022-01-06 DIAGNOSIS — K5900 Constipation, unspecified: Secondary | ICD-10-CM | POA: Diagnosis not present

## 2022-01-06 DIAGNOSIS — Z5111 Encounter for antineoplastic chemotherapy: Secondary | ICD-10-CM | POA: Insufficient documentation

## 2022-01-06 DIAGNOSIS — Z452 Encounter for adjustment and management of vascular access device: Secondary | ICD-10-CM | POA: Insufficient documentation

## 2022-01-06 DIAGNOSIS — K921 Melena: Secondary | ICD-10-CM | POA: Diagnosis not present

## 2022-01-06 DIAGNOSIS — Z170 Estrogen receptor positive status [ER+]: Secondary | ICD-10-CM

## 2022-01-06 NOTE — Assessment & Plan Note (Signed)
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  Discussed with Dr. Sedalia Muta tumor heterogeneity. BREAST MRI- JUNE 2023- The patient's known malignancy in the upper outer quadrant of the right breast measures 3.8 x 2.8 x 3.6 cm. Numerous other suspicious masses are identified in the right breast as above involving the upper outer and upper inner quadrants, located both anterior and posterior to the known malignancy.  Numerous enhancing foci in the skin as above are worrisome for skin metastases ; Bx proven. Biopsy proven metastatic node in the right axilla.  No evidence of malignancy in the left breast;  Two mildly prominent right internal mammary nodes were not FDG avid on recent PET-CT imaging.  Currently on neoadjuvant chemotherapy- TCH plus P x6 cycles;  MUGA scan May 31st, 2023- -61%.    # NEXT WEEK- 8/08 will Proceed with cycle #4   d-2udenyca.  Discussed regarding use of Claritin post growth factor.  Plan diagnostic mammogram/ultrasound post cycle #3 to assess response awaiting imaging on 8/03.  # Neutropenic fevers-status post cycle #3; s/p growth factor support.  COVID infection.  Currently resolved.   # PN G-1- sec to Taxotere- monitor for now. STABLE.   #Mucositis grade 1 improved with-salt water baking soda rinses; Magic mouthwash.  # DISPOSITION: # follow up- MD;labs- chemo as planned next week- Dr.B

## 2022-01-06 NOTE — Progress Notes (Signed)
[facility] [place] NOTE  Patient Care Team: Latanya Maudlin, NP as PCP - General (Family Medicine) Daiva Huge, RN as Oncology Nurse Navigator Cammie Sickle, [name]  CHIEF COMPLAINTS/PURPOSE OF CONSULTATION: Breast cancer  #  Oncology History Overview Note  MAY 2023-    Targeted ultrasound is performed, showing a 2.9 x 1.9 x 2.0 cm irregular hypoechoic mass right breast 10 o'clock position 6 cm from nipple at the site of palpable concern.   There is an abnormal 8 mm thickened right axillary lymph node.   There is skin thickening overlying the right breast. ------------------  A. BREAST, RIGHT AT 10:00, 6 CM FROM THE NIPPLE; ULTRASOUND-GUIDED CORE  NEEDLE BIOPSY:  - INVASIVE MAMMARY CARCINOMA, NO SPECIAL TYPE.   Size of invasive carcinoma: 11 mm in this sample  Histologic grade of invasive carcinoma: Grade 2                       Glandular/tubular differentiation score: 3                       Nuclear pleomorphism score: 2                       Mitotic rate score: 1                       Total score: 6  Ductal carcinoma in situ: Present, intermediate grade  Lymphovascular invasion: Not identified   B. LYMPH NODE, RIGHT AXILLARY; ULTRASOUND-GUIDED CORE NEEDLE BIOPSY:  - 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 IN GREATEST  EXTENT.   Comment:  The malignancy in the primary breast lesion appears morphologically  different from tumor in the lymph node sampling, with tumor present in  lymph node more suggestive of a histologic grade 3, with significantly  increased mitotic activity and pleomorphism. Due to the discrepancy in  these morphologic patterns, ER/PR/HER2 immunohistochemistry will be  performed on both blocks A1 and B1, with reflex to [place] for HER2 2+. The  results will be reported in an addendum.   ADDENDUM:  CASE SUMMARY: BREAST BIOMARKER TESTS - A - BREAST, RIGHT AT 10:00  Estrogen Receptor  (ER) Status: POSITIVE          Percentage of cells with nuclear positivity: 71-80%          Average intensity of staining: Strong   Progesterone Receptor (PgR) Status: POSITIVE          Percentage of cells with nuclear positivity: 81-90%          Average intensity of staining: Strong   HER2 (by immunohistochemistry): POSITIVE (Score 3+)       Percentage of cells with uniform intense complete membrane  staining: 61-70%  HER2 displays a heterogeneous staining pattern, with areas showing a  strong 3+ staining pattern, and other regions with complete absence (0+)  of staining.   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  Discussed with Dr. Sedalia Muta tumor heterogeneity. BREAST MRI- JUNE 2023- The patient's known malignancy in the upper outer quadrant of the right breast measures 3.8 x 2.8 x 3.6 cm. Numerous other suspicious masses are identified in the right breast as above involving the upper outer and upper inner quadrants, located both anterior and posterior to the known malignancy.  Numerous enhancing foci in the skin as above are worrisome for skin metastases ; Bx proven. Biopsy proven metastatic node in the right axilla.  No evidence of malignancy in the left breast;  Two mildly prominent right internal mammary nodes were not FDG avid on recent PET-CT imaging.  Currently on neoadjuvant chemotherapy- TCH  plus P x6 cycles;  MUGA scan May 31st, 2023- -61%.    # June 7th, 2023- NEO-ADJUVANT CHEMO- TCH+P   # Genetic counseling s/p- NEG for any deleterious mutations.     Carcinoma of upper-outer quadrant of right breast in female, estrogen receptor positive ([place])  10/26/2021 Initial Diagnosis   Carcinoma of upper-outer quadrant of right breast in female, estrogen receptor positive ([place])   10/26/2021 Cancer Staging   Staging form: Breast, AJCC 8th Edition - Clinical: Stage IB (cT2, cN1, cM0, G2, ER+, PR+, HER2+) - Signed by Cammie Sickle, MD on 10/26/2021 Histologic grading system: 3 grade system   11/11/2021 -  Chemotherapy   Patient is on Treatment Plan : BREAST  Docetaxel + Carboplatin + Trastuzumab + Pertuzumab  (TCHP) q21d       Genetic Testing   Negative genetic testing. No pathogenic variants identified on the [facility] CancerNext-Expanded+RNA panel. The report date is 11/29/2021.  The CancerNext-Expanded + RNAinsight gene panel offered by [employer] and includes sequencing and rearrangement analysis for the following 77 genes: IP, ALK, APC*, ATM*, AXIN2, BAP1, BARD1, BLM, BMPR1A, BRCA1*, BRCA2*, BRIP1*, CDC73, CDH1*,CDK4, CDKN1B, CDKN2A, CHEK2*, CTNNA1, DICER1, FANCC, FH, FLCN, GALNT12, KIF1B, LZTR1, MAX, MEN1, MET, MLH1*, MSH2*, MSH3, MSH6*, MUTYH*, NBN, NF1*, NF2, NTHL1, PALB2*, PHOX2B, PMS2*, POT1, PRKAR1A, PTCH1, PTEN*, RAD51C*, RAD51D*,RB1, RECQL, RET, SDHA, SDHAF2, SDHB, SDHC, SDHD, SMAD4, SMARCA4, SMARCB1, SMARCE1, STK11, SUFU, TMEM127, TP53*,TSC1, TSC2, VHL and XRCC2 (sequencing and deletion/duplication); EGFR, EGLN1, HOXB13, KIT, MITF, PDGFRA, POLD1 and POLE (sequencing only); EPCAM and GREM1 (deletion/duplication only).     HISTORY OF PRESENTING ILLNESS: Patient is ambulating independently.  Accompanied by her husband.   Marilyn Page 46 y.o.  female right breast TRIPLE POSITIVE with T4- Stage III currently on neoadjuvant TCH plus P chemotherapy is here for follow-up.  In  the interim patient was admitted to the hospital for neutropenic fever.  Patient positive for COVID Infection.  Also treated with broad-spectrum antibiotics.  Patient treated with remdesivir/steroids.  Mild diarrhea currently improved.  Patient currently status post neoadjuvant TCH plus P cycle #3.    Complains of mild tingling and numbness in extremities.  Mild mucositis improved with Magic mouthwash. Complains of fatigue. No skin rash.  Review of Systems  Constitutional:  Negative for chills, diaphoresis, fever, malaise/fatigue and weight loss.  HENT:  Negative for nosebleeds and sore throat.   Eyes:  Negative for double vision.  Respiratory:  Negative for cough, hemoptysis, sputum production, shortness of breath and wheezing.   Cardiovascular:  Negative for chest pain, palpitations, orthopnea and leg swelling.  Gastrointestinal:  Negative for abdominal pain, blood in stool, constipation, diarrhea, heartburn, melena, nausea and vomiting.  Genitourinary:  Negative for dysuria, frequency and urgency.  Musculoskeletal:  Negative for back pain and joint pain.  Skin: Negative.  Negative for itching and rash.  Neurological:  Negative for dizziness, tingling, focal weakness, weakness and headaches.  Endo/Heme/Allergies:  Does not bruise/bleed easily.  Psychiatric/Behavioral:  Negative for depression. The patient is not nervous/anxious and does not have insomnia.      MEDICAL HISTORY:  Past Medical History:  Diagnosis Date   Carcinoma of upper-outer quadrant of right breast in female, estrogen receptor positive ([place]) 10/26/2021   Diabetes mellitus without complication ([place])    History of gestational diabetes     SURGICAL HISTORY: Past Surgical History:  Procedure Laterality Date   BREAST BIOPSY Right 10/19/2021   [place] Core bx 10:00 6 cmfn Ribbon Clip. Axilla Butterfly hydro clip- path pending   CESAREAN SECTION  2005/2008   DILATION AND CURETTAGE OF UTERUS  2003   PORTACATH PLACEMENT  Left 11/09/2021   Procedure: INSERTION PORT-A-CATH;  Surgeon: Robert Bellow, MD;  Location: [facility] ORS;  Service: General;  Laterality: Left;   SKIN BIOPSY Right 11/09/2021   Procedure: PUNCH BIOPSY SKIN, TATTOO LYMPH NODE RIGHT AXILLA;  Surgeon: Robert Bellow, MD;  Location: [facility] ORS;  Service: General;  Laterality: Right;    SOCIAL HISTORY: Social History   Socioeconomic History   Marital status: Married    Spouse name: Jaci Standard   Number of children: 2   Years of education: Not on file   Highest education level: Not on file  Occupational History   Occupation: and Optometrist  Tobacco Use   Smoking status: Never   Smokeless tobacco: Never  Vaping Use   Vaping Use: Never used  Substance and Sexual Activity   Alcohol use: Never   Drug use: Never   Sexual activity: Yes    Birth control/protection: Other-see comments, Surgical    Comment: vasectomy  Other Topics Concern   Not on file  Social History Narrative   Pre-school teacher; in [place]; 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    Social Determinants of Health   Financial Resource Strain: Not on file  Food Insecurity: Not on file  Transportation Needs: Not on file  Physical Activity: Inactive (09/26/2017)   Exercise Vital Sign    Days of Exercise per Week: 0 days    Minutes of Exercise per Session: 0 min  Stress: Not on file  Social Connections: Not on file  Intimate Partner Violence: Not on file    FAMILY HISTORY: Family History  Problem Relation Age of Onset   Ovarian cysts Mother    Migraines Mother    Melanoma Mother        on leg   Hyperlipidemia Father    Diabetes Maternal Aunt    Stomach cancer Paternal Aunt    Diabetes Maternal Grandfather    Lymphoma Cousin 17   Breast cancer Neg Hx     ALLERGIES:  is allergic to metformin.  MEDICATIONS:  Current Outpatient Medications  Medication Sig Dispense Refill   acetaminophen (TYLENOL) 325 MG tablet Take 2 tablets (650 mg total) by mouth every 6 (six) hours  as needed for mild pain (or Fever >/= 101).     alum & mag hydroxide-simeth (MAALOX/MYLANTA) 200-200-20 MG/5ML suspension Take 30 mLs by mouth every 4 (four) hours as needed for indigestion. 355 mL 0   ascorbic acid (VITAMIN C) 500 MG tablet Take  1 tablet (500 mg total) by mouth daily. 30 tablet    calcium-vitamin D (OSCAL WITH D) 500-5 MG-MCG tablet Take 1 tablet by mouth 2 (two) times daily. 60 tablet 2   chlorpheniramine-HYDROcodone 10-8 MG/5ML Take 5 mLs by mouth every 12 (twelve) hours as needed for cough. 115 mL 0   dexamethasone (DECADRON) 4 MG tablet One pill twice a day - Start the day prior to chemo; do not take the day of chemo. (Patient taking differently: Take 4 mg by mouth 2 (two) times daily. Start the day prior to chemo; do not take the day of chemo.) 40 tablet 0   feeding supplement (ENSURE ENLIVE / ENSURE PLUS) LIQD Take 237 mLs by mouth 3 (three) times daily between meals. 237 mL 12   ferrous sulfate 325 (65 FE) MG tablet Take 325 mg by mouth 2 (two) times daily with a meal.     guaiFENesin (MUCINEX) 600 MG 12 hr tablet Take 2 tablets (1,200 mg total) by mouth 2 (two) times daily.     Ipratropium-Albuterol (COMBIVENT) 20-100 MCG/ACT AERS respimat Inhale 1 puff into the lungs every 6 (six) hours. 4 g 0   lidocaine-prilocaine (EMLA) cream Apply on the port. 30 -45 min  prior to port access. 30 g 3   loperamide (IMODIUM) 2 MG capsule Take 1 capsule (2 mg total) by mouth as needed for diarrhea or loose stools. 30 capsule 0   magic mouthwash w/lidocaine SOLN Take 5 mLs by mouth 4 (four) times daily as needed for mouth pain. Sig: Swish/Swallow 5-10 ml four times a day as needed. Dispense 480 ml. 1RF 480 mL 1   Multiple Vitamin (MULTIVITAMIN WITH MINERALS) TABS tablet Take 1 tablet by mouth daily.     norethindrone (MICRONOR) 0.35 MG tablet Take 1 tablet (0.35 mg total) by mouth daily. 84 tablet 2   ONETOUCH VERIO test strip SMARTSIG:1 Each Via Meter Daily PRN     phenol (CHLORASEPTIC)  1.4 % LIQD Use as directed 1 spray in the mouth or throat as needed for throat irritation / pain.  0   prochlorperazine (COMPAZINE) 10 MG tablet Take 1 tablet (10 mg total) by mouth every 6 (six) hours as needed for nausea or vomiting. 40 tablet 1   Semaglutide, 1 MG/DOSE, 4 MG/3ML SOPN Inject into the skin. Inject 1 mg (0.75 ml) subcutaneously once a week.     zinc sulfate 220 (50 Zn) MG capsule Take 1 capsule (220 mg total) by mouth daily.     No current facility-administered medications for this visit.      Marland Kitchen  PHYSICAL EXAMINATION: ECOG PERFORMANCE STATUS: 0 - Asymptomatic  Vitals:   01/06/22 0900  BP: 96/60  Pulse: (!) 105  Resp: 18  Temp: 98.1 F (36.7 C)  SpO2: 99%    Filed Weights   01/06/22 0900  Weight: 214 lb (97.1 kg)    Right breast-10:00 vague 3 to 4 cm mass noted-with skin thickening/ ?  Biopsy changes.  No nipple changes no erythema.   Physical Exam Vitals and nursing note reviewed.  HENT:     Head: Normocephalic and atraumatic.     Mouth/Throat:     Pharynx: Oropharynx is clear.  Eyes:     Extraocular Movements: Extraocular movements intact.     Pupils: Pupils are equal, round, and reactive to light.  Cardiovascular:     Rate and Rhythm: Normal rate and regular rhythm.  Pulmonary:     Comments: Decreased breath sounds bilaterally.  Abdominal:  Palpations: Abdomen is soft.  Musculoskeletal:        General: Normal range of motion.     Cervical back: Normal range of motion.  Skin:    General: Skin is warm.  Neurological:     General: No focal deficit present.     Mental Status: She is alert and oriented to person, place, and time.  Psychiatric:        Behavior: Behavior normal.        Judgment: Judgment normal.      LABORATORY DATA:  I have reviewed the data as listed Lab Results  Component Value Date   WBC 4.0 01/01/2022   HGB 10.5 (L) 01/01/2022   HCT 31.6 (L) 01/01/2022   MCV 87.5 01/01/2022   PLT 115 (L) 01/01/2022   Recent  Labs    12/27/21 2105 12/28/21 0329 12/29/21 0401 12/30/21 0501 01/01/22 1012  NA 134*   < > 138 140 139  K 3.6   < > 3.4* 3.2* 3.3*  CL 104   < > 107 107 104  CO2 22   < > $R'25 25 29  'Ye$ GLUCOSE 192*   < > 99 92 108*  BUN 16   < > $R'9 8 8  'pV$ CREATININE 0.93   < > 0.71 0.64 0.64  CALCIUM 8.6*   < > 8.3* 8.3* 8.4*  GFRNONAA >60   < > >60 >60 >60  PROT 7.0  --  5.9* 5.9*  --   ALBUMIN 3.7  --  3.1* 3.0*  --   AST 42*  --  29 30  --   ALT 51*  --  39 35  --   ALKPHOS 70  --  51 49  --   BILITOT 1.1  --  0.5 0.3  --   BILIDIR 0.2  --   --   --   --   IBILI 0.9  --   --   --   --    < > = values in this interval not displayed.    RADIOGRAPHIC STUDIES: I have personally reviewed the radiological images as listed and agreed with the findings in the report. CT Angio Chest Pulmonary Embolism (PE) W or WO Contrast  Result Date: 12/28/2021 CLINICAL DATA:  High clinical suspicion for PE EXAM: CT ANGIOGRAPHY CHEST WITH CONTRAST TECHNIQUE: Multidetector CT imaging of the chest was performed using the standard protocol during bolus administration of intravenous contrast. Multiplanar CT image reconstructions and MIPs were obtained to evaluate the vascular anatomy. RADIATION DOSE REDUCTION: This exam was performed according to the departmental dose-optimization program which includes automated exposure control, adjustment of the mA and/or kV according to patient size and/or use of iterative reconstruction technique. CONTRAST:  23mL OMNIPAQUE IOHEXOL 350 MG/ML SOLN COMPARISON:  Previous studies including the chest radiograph done on 12/27/2021 FINDINGS: Cardiovascular: There is homogeneous enhancement in thoracic aorta. There are no intraluminal filling defects in pulmonary artery branches. There is ectasia of the main pulmonary artery measuring 3.8 cm suggesting pulmonary arterial hypertension. Mediastinum/Nodes: There are slightly enlarged lymph nodes in mediastinum. Lungs/Pleura: There is no focal pulmonary  consolidation. There is no pleural effusion or pneumothorax. Upper Abdomen: There is fatty infiltration liver. Spleen measures 14 cm in AP diameter. Musculoskeletal: No acute findings are seen. Review of the MIP images confirms the above findings. IMPRESSION: There is no evidence of pulmonary artery embolism. There is no evidence of thoracic aortic dissection. There is no focal pulmonary consolidation. Fatty liver.  Enlarged spleen.  Electronically Signed   By: Elmer Picker M.D.   On: 12/28/2021 13:51   CT ABDOMEN PELVIS W CONTRAST  Result Date: 12/27/2021 CLINICAL DATA:  Abdominal pain. Patient complains of cough fever for 2 days. EXAM: CT ABDOMEN AND PELVIS WITH CONTRAST TECHNIQUE: Multidetector CT imaging of the abdomen and pelvis was performed using the standard protocol following bolus administration of intravenous contrast. RADIATION DOSE REDUCTION: This exam was performed according to the departmental dose-optimization program which includes automated exposure control, adjustment of the mA and/or kV according to patient size and/or use of iterative reconstruction technique. CONTRAST:  174mL OMNIPAQUE IOHEXOL 300 MG/ML  SOLN COMPARISON:  None Available. FINDINGS: Lower chest: No acute abnormality. Mild right breast skin thickening. Hepatobiliary: No focal liver abnormality is seen. No gallstones, gallbladder wall thickening, or biliary dilatation. Pancreas: Unremarkable. No pancreatic ductal dilatation or surrounding inflammatory changes. Spleen: Normal in size without focal abnormality. Adrenals/Urinary Tract: Mild left adrenal thickening. Kidneys are normal, without renal calculi, focal lesion, or hydronephrosis. Bladder is unremarkable. Stomach/Bowel: Stomach is within normal limits. Appendix appears normal. No evidence of bowel wall thickening, distention, or inflammatory changes. Vascular/Lymphatic: No significant vascular findings are present. No enlarged abdominal or pelvic lymph nodes.  Reproductive: Mildly enlarged uterus.  No adnexal mass. Other: No abdominal wall hernia or abnormality. No abdominopelvic ascites. Musculoskeletal: Degenerate disc disease of the lumbar spine prominent at L4-L5 and L5-S1. No acute osseous abnormality. IMPRESSION: 1.  No CT evidence of acute abdominal/pelvic process. 2. Mild left adrenal nodular thickening, which likely represent small adrenal adenoma. 3. Bowel loops are normal in caliber. Normal appendix. No evidence of colitis or diverticulitis. 3. Mildly enlarged globular uterus, pelvic sonogram for further evaluation would be helpful on a nonemergent basis. 4. 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 Electronically Signed   By: Keane Police D.O.   On: 12/27/2021 23:12   DG Chest 2 View  Result Date: 12/27/2021 CLINICAL DATA:  Cough, chest pain and shortness of breath. EXAM: CHEST - 2 VIEW COMPARISON:  November 09, 2021 FINDINGS: There is stable left-sided venous Port-A-Cath positioning. The heart size and mediastinal contours are within normal limits. Both lungs are clear. The visualized skeletal structures are unremarkable. IMPRESSION: No active cardiopulmonary disease. Electronically Signed   By: Virgina Norfolk M.D.   On: 12/27/2021 21:34    ASSESSMENT & PLAN:   Carcinoma of upper-outer quadrant of right breast in female, estrogen receptor positive ([place]) # MAY 2023- STAGE III-T4N1 ER/PR positive HER2 POSITIVE right breast cancer;LN-positive-ER/PR positive however 2 NEGATIVE s/p biopsy [see discussion below].  Discussed with Dr. Sedalia Muta tumor heterogeneity. BREAST MRI- JUNE 2023- The patient's known malignancy in the upper outer quadrant of the right breast measures 3.8 x 2.8 x 3.6 cm. Numerous other suspicious masses are identified in the right breast as above involving the upper outer and upper inner quadrants, located both anterior and posterior to the known malignancy.  Numerous  enhancing foci in the skin as above are worrisome for skin metastases ; Bx proven. Biopsy proven metastatic node in the right axilla.  No evidence of malignancy in the left breast;  Two mildly prominent right internal mammary nodes were not FDG avid on recent PET-CT imaging.  Currently on neoadjuvant chemotherapy- TCH plus P x6 cycles;  MUGA scan May 31st, 2023- -61%.    # NEXT WEEK- 8/08 will Proceed with cycle #4   d-2udenyca.  Discussed regarding use of Claritin post growth factor.  Plan diagnostic mammogram/ultrasound post cycle #3 to assess response awaiting imaging on 8/03.  # Neutropenic fevers-status post cycle #3; s/p growth factor support.  COVID infection.  Currently resolved.   # PN G-1- sec to Taxotere- monitor for now. STABLE.   #Mucositis grade 1 improved with-salt water baking soda rinses; Magic mouthwash.  # DISPOSITION: # follow up- MD;labs- chemo as planned next week- Dr.B    All questions were answered. The patient/family knows to call the clinic with any problems, questions or concerns.       Cammie Sickle, MD 01/06/2022 10:14 AM

## 2022-01-06 NOTE — Progress Notes (Signed)
Patient denies new problems/concerns today.    Manual bp today 96/60 HR 105.  Denies symptoms.

## 2022-01-07 ENCOUNTER — Ambulatory Visit
Admission: RE | Admit: 2022-01-07 | Discharge: 2022-01-07 | Disposition: A | Discharge status: Home / Self Care | Payer: 59 | Source: Ambulatory Visit | Attending: Internal Medicine | Admitting: Internal Medicine

## 2022-01-07 DIAGNOSIS — Z170 Estrogen receptor positive status [ER+]: Secondary | ICD-10-CM | POA: Diagnosis present

## 2022-01-07 DIAGNOSIS — C50411 Malignant neoplasm of upper-outer quadrant of right female breast: Secondary | ICD-10-CM | POA: Diagnosis present

## 2022-01-07 HISTORY — DX: Personal history of antineoplastic chemotherapy: Z92.21

## 2022-01-11 MED FILL — Fosaprepitant Dimeglumine For IV Infusion 150 MG (Base Eq): INTRAVENOUS | Qty: 5 | Status: AC

## 2022-01-11 MED FILL — Dexamethasone Sodium Phosphate Inj 100 MG/10ML: INTRAMUSCULAR | Qty: 1 | Status: AC

## 2022-01-12 ENCOUNTER — Other Ambulatory Visit: Payer: Self-pay

## 2022-01-12 ENCOUNTER — Inpatient Hospital Stay: Payer: 59

## 2022-01-12 ENCOUNTER — Inpatient Hospital Stay: Payer: 59 | Admitting: Internal Medicine

## 2022-01-12 ENCOUNTER — Other Ambulatory Visit: Payer: 59

## 2022-01-12 ENCOUNTER — Encounter: Payer: Self-pay | Admitting: Internal Medicine

## 2022-01-12 VITALS — BP 120/54 | HR 54

## 2022-01-12 DIAGNOSIS — C50411 Malignant neoplasm of upper-outer quadrant of right female breast: Secondary | ICD-10-CM | POA: Diagnosis not present

## 2022-01-12 DIAGNOSIS — Z170 Estrogen receptor positive status [ER+]: Secondary | ICD-10-CM

## 2022-01-12 DIAGNOSIS — Z5112 Encounter for antineoplastic immunotherapy: Secondary | ICD-10-CM | POA: Diagnosis not present

## 2022-01-12 LAB — COMPREHENSIVE METABOLIC PANEL
ALT: 28 U/L (ref 0–44)
AST: 30 U/L (ref 15–41)
Albumin: 3.4 g/dL — ABNORMAL LOW (ref 3.5–5.0)
Alkaline Phosphatase: 53 U/L (ref 38–126)
Anion gap: 7 (ref 5–15)
BUN: 10 mg/dL (ref 6–20)
CO2: 25 mmol/L (ref 22–32)
Calcium: 9.5 mg/dL (ref 8.9–10.3)
Chloride: 107 mmol/L (ref 98–111)
Creatinine, Ser: 0.8 mg/dL (ref 0.44–1.00)
GFR, Estimated: >60 mL/min (ref >60)
Glucose, Bld: 203 mg/dL — ABNORMAL HIGH (ref 70–99)
Potassium: 3.9 mmol/L (ref 3.5–5.1)
Sodium: 139 mmol/L (ref 135–145)
Total Bilirubin: 0.4 mg/dL (ref 0.3–1.2)
Total Protein: 6.6 g/dL (ref 6.5–8.1)

## 2022-01-12 LAB — IRON AND TIBC
Iron: 100 ug/dL (ref 28–170)
Saturation Ratios: 29 % (ref 10.4–31.8)
TIBC: 343 ug/dL (ref 250–450)
UIBC: 243 ug/dL

## 2022-01-12 LAB — CBC WITH DIFFERENTIAL/PLATELET
Abs Immature Granulocytes: 0.05 10*3/uL (ref 0.00–0.07)
Basophils Absolute: 0 10*3/uL (ref 0.0–0.1)
Basophils Relative: 0 %
Eosinophils Absolute: 0 10*3/uL (ref 0.0–0.5)
Eosinophils Relative: 0 %
HCT: 32.5 % — ABNORMAL LOW (ref 36.0–46.0)
Hemoglobin: 10.6 g/dL — ABNORMAL LOW (ref 12.0–15.0)
Immature Granulocytes: 1 %
Lymphocytes Relative: 12 %
Lymphs Abs: 0.8 10*3/uL (ref 0.7–4.0)
MCH: 29.9 pg (ref 26.0–34.0)
MCHC: 32.6 g/dL (ref 30.0–36.0)
MCV: 91.5 fL (ref 80.0–100.0)
Monocytes Absolute: 0.3 10*3/uL (ref 0.1–1.0)
Monocytes Relative: 5 %
Neutro Abs: 5.5 10*3/uL (ref 1.7–7.7)
Neutrophils Relative %: 82 %
Platelets: 167 10*3/uL (ref 150–400)
RBC: 3.55 MIL/uL — ABNORMAL LOW (ref 3.87–5.11)
RDW: 18.8 % — ABNORMAL HIGH (ref 11.5–15.5)
WBC: 6.7 10*3/uL (ref 4.0–10.5)
nRBC: 0 % (ref 0.0–0.2)

## 2022-01-12 LAB — FERRITIN: Ferritin: 77 ng/mL (ref 11–307)

## 2022-01-12 MED ORDER — SODIUM CHLORIDE 0.9 % IV SOLN
Freq: Once | INTRAVENOUS | Status: AC
  Filled 2022-01-12: qty 250

## 2022-01-12 MED ORDER — PALONOSETRON HCL INJECTION 0.25 MG/5ML
0.2500 mg | Freq: Once | INTRAVENOUS | Status: AC
  Administered 2022-01-12: 0.25 mg via INTRAVENOUS
  Filled 2022-01-12: qty 5

## 2022-01-12 MED ORDER — SODIUM CHLORIDE 0.9 % IV SOLN
10.0000 mg | Freq: Once | INTRAVENOUS | Status: AC
  Administered 2022-01-12: 10 mg via INTRAVENOUS
  Filled 2022-01-12: qty 10

## 2022-01-12 MED ORDER — TRASTUZUMAB-DKST CHEMO 150 MG IV SOLR
6.0000 mg/kg | Freq: Once | INTRAVENOUS | Status: AC
  Administered 2022-01-12: 609 mg via INTRAVENOUS
  Filled 2022-01-12: qty 29

## 2022-01-12 MED ORDER — SODIUM CHLORIDE 0.9 % IV SOLN
150.0000 mg | Freq: Once | INTRAVENOUS | Status: AC
  Administered 2022-01-12: 150 mg via INTRAVENOUS
  Filled 2022-01-12: qty 150

## 2022-01-12 MED ORDER — SODIUM CHLORIDE 0.9 % IV SOLN
75.0000 mg/m2 | Freq: Once | INTRAVENOUS | Status: AC
  Administered 2022-01-12: 160 mg via INTRAVENOUS
  Filled 2022-01-12: qty 16

## 2022-01-12 MED ORDER — HEPARIN SOD (PORK) LOCK FLUSH 100 UNIT/ML IV SOLN
500.0000 [IU] | Freq: Once | INTRAVENOUS | Status: AC | PRN
  Administered 2022-01-12: 500 [IU]
  Filled 2022-01-12: qty 5

## 2022-01-12 MED ORDER — SODIUM CHLORIDE 0.9 % IV SOLN
420.0000 mg | Freq: Once | INTRAVENOUS | Status: AC
  Administered 2022-01-12: 420 mg via INTRAVENOUS
  Filled 2022-01-12: qty 14

## 2022-01-12 MED ORDER — DIPHENHYDRAMINE HCL 25 MG PO CAPS
50.0000 mg | ORAL_CAPSULE | Freq: Once | ORAL | Status: AC
  Administered 2022-01-12: 50 mg via ORAL
  Filled 2022-01-12: qty 2

## 2022-01-12 MED ORDER — SODIUM CHLORIDE 0.9 % IV SOLN
900.0000 mg | Freq: Once | INTRAVENOUS | Status: AC
  Administered 2022-01-12: 900 mg via INTRAVENOUS
  Filled 2022-01-12: qty 90

## 2022-01-12 MED ORDER — ACETAMINOPHEN 325 MG PO TABS
650.0000 mg | ORAL_TABLET | Freq: Once | ORAL | Status: AC
  Administered 2022-01-12: 650 mg via ORAL
  Filled 2022-01-12: qty 2

## 2022-01-12 NOTE — Progress Notes (Signed)
Pt states her legs feel restless qhs and was wandering if she needed to increase her iron? She takes 325 mg ferrous sulfate.

## 2022-01-12 NOTE — Progress Notes (Signed)
[facility] [place] NOTE  Patient Care Team: Latanya Maudlin, NP as PCP - General (Family Medicine) Daiva Huge, RN as Oncology Nurse Navigator Cammie Sickle, [name]  CHIEF COMPLAINTS/PURPOSE OF CONSULTATION: Breast cancer  #  Oncology History Overview Note  MAY 2023-    Targeted ultrasound is performed, showing a 2.9 x 1.9 x 2.0 cm irregular hypoechoic mass right breast 10 o'clock position 6 cm from nipple at the site of palpable concern.   There is an abnormal 8 mm thickened right axillary lymph node.   There is skin thickening overlying the right breast. ------------------  A. BREAST, RIGHT AT 10:00, 6 CM FROM THE NIPPLE; ULTRASOUND-GUIDED CORE  NEEDLE BIOPSY:  - INVASIVE MAMMARY CARCINOMA, NO SPECIAL TYPE.   Size of invasive carcinoma: 11 mm in this sample  Histologic grade of invasive carcinoma: Grade 2                       Glandular/tubular differentiation score: 3                       Nuclear pleomorphism score: 2                       Mitotic rate score: 1                       Total score: 6  Ductal carcinoma in situ: Present, intermediate grade  Lymphovascular invasion: Not identified   B. LYMPH NODE, RIGHT AXILLARY; ULTRASOUND-GUIDED CORE NEEDLE BIOPSY:  - 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 IN GREATEST  EXTENT.   Comment:  The malignancy in the primary breast lesion appears morphologically  different from tumor in the lymph node sampling, with tumor present in  lymph node more suggestive of a histologic grade 3, with significantly  increased mitotic activity and pleomorphism. Due to the discrepancy in  these morphologic patterns, ER/PR/HER2 immunohistochemistry will be  performed on both blocks A1 and B1, with reflex to [place] for HER2 2+. The  results will be reported in an addendum.   ADDENDUM:  CASE SUMMARY: BREAST BIOMARKER TESTS - A - BREAST, RIGHT AT 10:00  Estrogen Receptor  (ER) Status: POSITIVE          Percentage of cells with nuclear positivity: 71-80%          Average intensity of staining: Strong   Progesterone Receptor (PgR) Status: POSITIVE          Percentage of cells with nuclear positivity: 81-90%          Average intensity of staining: Strong   HER2 (by immunohistochemistry): POSITIVE (Score 3+)       Percentage of cells with uniform intense complete membrane  staining: 61-70%  HER2 displays a heterogeneous staining pattern, with areas showing a  strong 3+ staining pattern, and other regions with complete absence (0+)  of staining.   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  Discussed with Dr. Sedalia Muta tumor heterogeneity. BREAST MRI- JUNE 2023- The patient's known malignancy in the upper outer quadrant of the right breast measures 3.8 x 2.8 x 3.6 cm. Numerous other suspicious masses are identified in the right breast as above involving the upper outer and upper inner quadrants, located both anterior and posterior to the known malignancy.  Numerous enhancing foci in the skin as above are worrisome for skin metastases ; Bx proven. Biopsy proven metastatic node in the right axilla.  No evidence of malignancy in the left breast;  Two mildly prominent right internal mammary nodes were not FDG avid on recent PET-CT imaging.  Currently on neoadjuvant chemotherapy- TCH  plus P x6 cycles;  MUGA scan May 31st, 2023- -61%.    # June 7th, 2023- NEO-ADJUVANT CHEMO- TCH+P   # Genetic counseling s/p- NEG for any deleterious mutations.     Carcinoma of upper-outer quadrant of right breast in female, estrogen receptor positive ([place])  10/26/2021 Initial Diagnosis   Carcinoma of upper-outer quadrant of right breast in female, estrogen receptor positive ([place])   10/26/2021 Cancer Staging   Staging form: Breast, AJCC 8th Edition - Clinical: Stage IB (cT2, cN1, cM0, G2, ER+, PR+, HER2+) - Signed by Cammie Sickle, MD on 10/26/2021 Histologic grading system: 3 grade system   11/11/2021 -  Chemotherapy   Patient is on Treatment Plan : BREAST  Docetaxel + Carboplatin + Trastuzumab + Pertuzumab  (TCHP) q21d       Genetic Testing   Negative genetic testing. No pathogenic variants identified on the [facility] CancerNext-Expanded+RNA panel. The report date is 11/29/2021.  The CancerNext-Expanded + RNAinsight gene panel offered by [employer] and includes sequencing and rearrangement analysis for the following 77 genes: IP, ALK, APC*, ATM*, AXIN2, BAP1, BARD1, BLM, BMPR1A, BRCA1*, BRCA2*, BRIP1*, CDC73, CDH1*,CDK4, CDKN1B, CDKN2A, CHEK2*, CTNNA1, DICER1, FANCC, FH, FLCN, GALNT12, KIF1B, LZTR1, MAX, MEN1, MET, MLH1*, MSH2*, MSH3, MSH6*, MUTYH*, NBN, NF1*, NF2, NTHL1, PALB2*, PHOX2B, PMS2*, POT1, PRKAR1A, PTCH1, PTEN*, RAD51C*, RAD51D*,RB1, RECQL, RET, SDHA, SDHAF2, SDHB, SDHC, SDHD, SMAD4, SMARCA4, SMARCB1, SMARCE1, STK11, SUFU, TMEM127, TP53*,TSC1, TSC2, VHL and XRCC2 (sequencing and deletion/duplication); EGFR, EGLN1, HOXB13, KIT, MITF, PDGFRA, POLD1 and POLE (sequencing only); EPCAM and GREM1 (deletion/duplication only).     HISTORY OF PRESENTING ILLNESS: Patient is ambulating independently.  Accompanied by her husband.   Marilyn Page 46 y.o.  female right breast TRIPLE POSITIVE with T4- Stage III currently on neoadjuvant TCH plus P chemotherapy is here for follow-up/review  results of the interim ultrasound/mammogram.  Patient currently status post neoadjuvant TCH plus P cycle #3.  Patient continues to complains of mild tingling and numbness in extremities.  Complains of fatigue. No skin rash.  Review of Systems  Constitutional:  Negative for chills, diaphoresis, fever, malaise/fatigue and weight loss.  HENT:  Negative for nosebleeds and sore throat.   Eyes:  Negative for double vision.  Respiratory:  Negative for cough, hemoptysis, sputum production, shortness of breath and wheezing.   Cardiovascular:  Negative for chest pain, palpitations, orthopnea and leg swelling.  Gastrointestinal:  Negative for abdominal pain, blood in stool, constipation, diarrhea, heartburn, melena, nausea and vomiting.  Genitourinary:  Negative for dysuria, frequency and urgency.  Musculoskeletal:  Negative for back pain and joint  pain.  Skin: Negative.  Negative for itching and rash.  Neurological:  Negative for dizziness, tingling, focal weakness, weakness and headaches.  Endo/Heme/Allergies:  Does not bruise/bleed easily.  Psychiatric/Behavioral:  Negative for depression. The patient is not nervous/anxious and does not have insomnia.      MEDICAL HISTORY:  Past Medical History:  Diagnosis Date   Carcinoma of upper-outer quadrant of right breast in female, estrogen receptor positive ([place]) 10/26/2021   Diabetes mellitus without complication ([place])    History of gestational diabetes    Personal history of chemotherapy     SURGICAL HISTORY: Past Surgical History:  Procedure Laterality Date   BREAST BIOPSY Right 10/19/2021   [place] Core bx 10:00 6 cmfn Ribbon Clip-INVASIVE MAMMARY CARCINOMA,. Axilla Butterfly hydro clip-MACRO METASTATIC MAMMARY CARCINOMA   CESAREAN SECTION  2005/2008   DILATION AND CURETTAGE OF UTERUS  2003   PORTACATH PLACEMENT Left 11/09/2021   Procedure: INSERTION PORT-A-CATH;  Surgeon: Robert Bellow, MD;  Location: [facility] ORS;  Service: General;   Laterality: Left;   SKIN BIOPSY Right 11/09/2021   Procedure: PUNCH BIOPSY SKIN, TATTOO LYMPH NODE RIGHT AXILLA;  Surgeon: Robert Bellow, MD;  Location: [facility] ORS;  Service: General;  Laterality: Right;    SOCIAL HISTORY: Social History   Socioeconomic History   Marital status: Married    Spouse name: Jaci Standard   Number of children: 2   Years of education: Not on file   Highest education level: Not on file  Occupational History   Occupation: and Optometrist  Tobacco Use   Smoking status: Never   Smokeless tobacco: Never  Vaping Use   Vaping Use: Never used  Substance and Sexual Activity   Alcohol use: Never   Drug use: Never   Sexual activity: Yes    Birth control/protection: Other-see comments, Surgical    Comment: vasectomy  Other Topics Concern   Not on file  Social History Narrative   Pre-school teacher; in [place]; 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    Social Determinants of Health   Financial Resource Strain: Not on file  Food Insecurity: Not on file  Transportation Needs: Not on file  Physical Activity: Inactive (09/26/2017)   Exercise Vital Sign    Days of Exercise per Week: 0 days    Minutes of Exercise per Session: 0 min  Stress: Not on file  Social Connections: Not on file  Intimate Partner Violence: Not on file    FAMILY HISTORY: Family History  Problem Relation Age of Onset   Ovarian cysts Mother    Migraines Mother    Melanoma Mother        on leg   Hyperlipidemia Father    Diabetes Maternal Aunt    Stomach cancer Paternal Aunt    Diabetes Maternal Grandfather    Lymphoma Cousin 17   Breast cancer Neg Hx     ALLERGIES:  is allergic to metformin.  MEDICATIONS:  Current Outpatient Medications  Medication Sig Dispense Refill   acetaminophen (TYLENOL) 325 MG tablet Take 2 tablets (650 mg total) by mouth every 6 (six) hours as needed for mild pain (or Fever >/= 101).     alum & mag hydroxide-simeth (MAALOX/MYLANTA) 200-200-20 MG/5ML suspension  Take 30 mLs by mouth every 4 (four) hours as needed for indigestion. 355 mL 0   ascorbic acid (VITAMIN C) 500 MG tablet Take 1 tablet (500 mg total) by mouth daily. 30 tablet    calcium-vitamin D (OSCAL WITH D) 500-5 MG-MCG tablet Take 1 tablet  by mouth 2 (two) times daily. 60 tablet 2   chlorpheniramine-HYDROcodone 10-8 MG/5ML Take 5 mLs by mouth every 12 (twelve) hours as needed for cough. 115 mL 0   dexamethasone (DECADRON) 4 MG tablet One pill twice a day - Start the day prior to chemo; do not take the day of chemo. (Patient taking differently: Take 4 mg by mouth 2 (two) times daily. Start the day prior to chemo; do not take the day of chemo.) 40 tablet 0   feeding supplement (ENSURE ENLIVE / ENSURE PLUS) LIQD Take 237 mLs by mouth 3 (three) times daily between meals. 237 mL 12   ferrous sulfate 325 (65 FE) MG tablet Take 325 mg by mouth 2 (two) times daily with a meal.     guaiFENesin (MUCINEX) 600 MG 12 hr tablet Take 2 tablets (1,200 mg total) by mouth 2 (two) times daily.     Ipratropium-Albuterol (COMBIVENT) 20-100 MCG/ACT AERS respimat Inhale 1 puff into the lungs every 6 (six) hours. 4 g 0   lidocaine-prilocaine (EMLA) cream Apply on the port. 30 -45 min  prior to port access. 30 g 3   loperamide (IMODIUM) 2 MG capsule Take 1 capsule (2 mg total) by mouth as needed for diarrhea or loose stools. 30 capsule 0   magic mouthwash w/lidocaine SOLN Take 5 mLs by mouth 4 (four) times daily as needed for mouth pain. Sig: Swish/Swallow 5-10 ml four times a day as needed. Dispense 480 ml. 1RF 480 mL 1   Multiple Vitamin (MULTIVITAMIN WITH MINERALS) TABS tablet Take 1 tablet by mouth daily.     norethindrone (MICRONOR) 0.35 MG tablet Take 1 tablet (0.35 mg total) by mouth daily. 84 tablet 2   ONETOUCH VERIO test strip SMARTSIG:1 Each Via Meter Daily PRN     phenol (CHLORASEPTIC) 1.4 % LIQD Use as directed 1 spray in the mouth or throat as needed for throat irritation / pain.  0   prochlorperazine  (COMPAZINE) 10 MG tablet Take 1 tablet (10 mg total) by mouth every 6 (six) hours as needed for nausea or vomiting. 40 tablet 1   Semaglutide, 1 MG/DOSE, 4 MG/3ML SOPN Inject into the skin. Inject 1 mg (0.75 ml) subcutaneously once a week.     zinc sulfate 220 (50 Zn) MG capsule Take 1 capsule (220 mg total) by mouth daily.     No current facility-administered medications for this visit.      Marland Kitchen  PHYSICAL EXAMINATION: ECOG PERFORMANCE STATUS: 0 - Asymptomatic  Vitals:   01/12/22 0838  BP: 108/83  Pulse: 80  Temp: (!) 97.1 F (36.2 C)  SpO2: 100%     Filed Weights   01/12/22 0838  Weight: 214 lb 12.8 oz (97.4 kg)    Right breast-10:00 vague 3 to 4 cm mass noted-with skin thickening/ ?  Biopsy changes.  No nipple changes no erythema.   Physical Exam Vitals and nursing note reviewed.  HENT:     Head: Normocephalic and atraumatic.     Mouth/Throat:     Pharynx: Oropharynx is clear.  Eyes:     Extraocular Movements: Extraocular movements intact.     Pupils: Pupils are equal, round, and reactive to light.  Cardiovascular:     Rate and Rhythm: Normal rate and regular rhythm.  Pulmonary:     Comments: Decreased breath sounds bilaterally.  Abdominal:     Palpations: Abdomen is soft.  Musculoskeletal:        General: Normal range of motion.  Cervical back: Normal range of motion.  Skin:    General: Skin is warm.  Neurological:     General: No focal deficit present.     Mental Status: She is alert and oriented to person, place, and time.  Psychiatric:        Behavior: Behavior normal.        Judgment: Judgment normal.      LABORATORY DATA:  I have reviewed the data as listed Lab Results  Component Value Date   WBC 6.7 01/12/2022   HGB 10.6 (L) 01/12/2022   HCT 32.5 (L) 01/12/2022   MCV 91.5 01/12/2022   PLT 167 01/12/2022   Recent Labs    12/27/21 2105 12/28/21 0329 12/29/21 0401 12/30/21 0501 01/01/22 1012 01/12/22 0823  NA 134*   < > 138 140 139  139  K 3.6   < > 3.4* 3.2* 3.3* 3.9  CL 104   < > 107 107 104 107  CO2 22   < > $R'25 25 29 25  '[place]$ GLUCOSE 192*   < > 99 92 108* 203*  BUN 16   < > $R'9 8 8 10  'VC$ CREATININE 0.93   < > 0.71 0.64 0.64 0.80  CALCIUM 8.6*   < > 8.3* 8.3* 8.4* 9.5  GFRNONAA >60   < > >60 >60 >60 >60  PROT 7.0  --  5.9* 5.9*  --  6.6  ALBUMIN 3.7  --  3.1* 3.0*  --  3.4*  AST 42*  --  29 30  --  30  ALT 51*  --  39 35  --  28  ALKPHOS 70  --  51 49  --  53  BILITOT 1.1  --  0.5 0.3  --  0.4  BILIDIR 0.2  --   --   --   --   --   IBILI 0.9  --   --   --   --   --    < > = values in this interval not displayed.    RADIOGRAPHIC STUDIES: I have personally reviewed the radiological images as listed and agreed with the findings in the report. MM DIAG BREAST TOMO UNI RIGHT  Result Date: 01/07/2022 CLINICAL DATA:  History of biopsy-proven right breast invasive ductal carcinoma and positive node. Status post 3 rounds chemotherapy, presenting for follow-up imaging to assess treatment response. EXAM: DIGITAL DIAGNOSTIC UNILATERAL RIGHT MAMMOGRAM WITH TOMOSYNTHESIS; ULTRASOUND RIGHT BREAST LIMITED TECHNIQUE: Right digital diagnostic mammography and breast tomosynthesis was performed.; Targeted ultrasound examination of the right breast was performed COMPARISON:  Previous exam(s). ACR Breast Density Category c: The breast tissue is heterogeneously dense, which may obscure small masses. FINDINGS: Mammogram: Redemonstrated mass with associated architectural distortion in the right upper outer breast, with an associated ribbon shaped biopsy marking clip, corresponding to biopsy-proven malignancy. Notably, accurate size measurement of the mass on mammogram is slightly limited given the irregular margin and surrounding dense breast tissue. Persistent right breast skin thickening. Decreased right axillary lymphadenopathy. Ultrasound: Targeted right breast ultrasound redemonstrates an irregular hypoechoic mass in the right breast 10 o'clock  position 6 cm from the nipple. The mass measures up to 2.0 x 1.1 by 1.3 cm, previously measuring 2.9 x 1.9 x 2.0 cm in April 2023. Incidentally visualized benign simple cyst in the right breast 9:30 position measuring up to 3.4 x 1.3 x 3.3 cm. Targeted right axillary ultrasound demonstrates decreased right axillary lymphadenopathy, with abnormal cortical thickening up to 3 mm, previously 8  mm. IMPRESSION: 1. Slightly decreased size of the palpable biopsy-proven malignancy in the right breast 10 o'clock position, measuring up to 2.0 cm and previously measuring up to 2.9 cm. 2.  Persistent right breast skin thickening. 3.  Decreased right axillary lymphadenopathy. RECOMMENDATION: Recommend continued surgical and oncologic follow up. I have discussed the findings and recommendations with the patient. If applicable, a reminder letter will be sent to the patient regarding the next appointment. BI-RADS CATEGORY  6: Known biopsy-proven malignancy. Electronically Signed   By: Beryle Flock M.D.   On: 01/07/2022 11:00  US Breast Limited Uni Right Inc Axilla  Result Date: 01/07/2022 CLINICAL DATA:  History of biopsy-proven right breast invasive ductal carcinoma and positive node. Status post 3 rounds chemotherapy, presenting for follow-up imaging to assess treatment response. EXAM: DIGITAL DIAGNOSTIC UNILATERAL RIGHT MAMMOGRAM WITH TOMOSYNTHESIS; ULTRASOUND RIGHT BREAST LIMITED TECHNIQUE: Right digital diagnostic mammography and breast tomosynthesis was performed.; Targeted ultrasound examination of the right breast was performed COMPARISON:  Previous exam(s). ACR Breast Density Category c: The breast tissue is heterogeneously dense, which may obscure small masses. FINDINGS: Mammogram: Redemonstrated mass with associated architectural distortion in the right upper outer breast, with an associated ribbon shaped biopsy marking clip, corresponding to biopsy-proven malignancy. Notably, accurate size measurement of the mass  on mammogram is slightly limited given the irregular margin and surrounding dense breast tissue. Persistent right breast skin thickening. Decreased right axillary lymphadenopathy. Ultrasound: Targeted right breast ultrasound redemonstrates an irregular hypoechoic mass in the right breast 10 o'clock position 6 cm from the nipple. The mass measures up to 2.0 x 1.1 by 1.3 cm, previously measuring 2.9 x 1.9 x 2.0 cm in April 2023. Incidentally visualized benign simple cyst in the right breast 9:30 position measuring up to 3.4 x 1.3 x 3.3 cm. Targeted right axillary ultrasound demonstrates decreased right axillary lymphadenopathy, with abnormal cortical thickening up to 3 mm, previously 8 mm. IMPRESSION: 1. Slightly decreased size of the palpable biopsy-proven malignancy in the right breast 10 o'clock position, measuring up to 2.0 cm and previously measuring up to 2.9 cm. 2.  Persistent right breast skin thickening. 3.  Decreased right axillary lymphadenopathy. RECOMMENDATION: Recommend continued surgical and oncologic follow up. I have discussed the findings and recommendations with the patient. If applicable, a reminder letter will be sent to the patient regarding the next appointment. BI-RADS CATEGORY  6: Known biopsy-proven malignancy. Electronically Signed   By: Beryle Flock M.D.   On: 01/07/2022 11:00  CT Angio Chest Pulmonary Embolism (PE) W or WO Contrast  Result Date: 12/28/2021 CLINICAL DATA:  High clinical suspicion for PE EXAM: CT ANGIOGRAPHY CHEST WITH CONTRAST TECHNIQUE: Multidetector CT imaging of the chest was performed using the standard protocol during bolus administration of intravenous contrast. Multiplanar CT image reconstructions and MIPs were obtained to evaluate the vascular anatomy. RADIATION DOSE REDUCTION: This exam was performed according to the departmental dose-optimization program which includes automated exposure control, adjustment of the mA and/or kV according to patient size  and/or use of iterative reconstruction technique. CONTRAST:  61mL OMNIPAQUE IOHEXOL 350 MG/ML SOLN COMPARISON:  Previous studies including the chest radiograph done on 12/27/2021 FINDINGS: Cardiovascular: There is homogeneous enhancement in thoracic aorta. There are no intraluminal filling defects in pulmonary artery branches. There is ectasia of the main pulmonary artery measuring 3.8 cm suggesting pulmonary arterial hypertension. Mediastinum/Nodes: There are slightly enlarged lymph nodes in mediastinum. Lungs/Pleura: There is no focal pulmonary consolidation. There is no pleural effusion or pneumothorax. Upper Abdomen: There is  fatty infiltration liver. Spleen measures 14 cm in AP diameter. Musculoskeletal: No acute findings are seen. Review of the MIP images confirms the above findings. IMPRESSION: There is no evidence of pulmonary artery embolism. There is no evidence of thoracic aortic dissection. There is no focal pulmonary consolidation. Fatty liver.  Enlarged spleen. Electronically Signed   By: Elmer Picker M.D.   On: 12/28/2021 13:51   CT ABDOMEN PELVIS W CONTRAST  Result Date: 12/27/2021 CLINICAL DATA:  Abdominal pain. Patient complains of cough fever for 2 days. EXAM: CT ABDOMEN AND PELVIS WITH CONTRAST TECHNIQUE: Multidetector CT imaging of the abdomen and pelvis was performed using the standard protocol following bolus administration of intravenous contrast. RADIATION DOSE REDUCTION: This exam was performed according to the departmental dose-optimization program which includes automated exposure control, adjustment of the mA and/or kV according to patient size and/or use of iterative reconstruction technique. CONTRAST:  11mL OMNIPAQUE IOHEXOL 300 MG/ML  SOLN COMPARISON:  None Available. FINDINGS: Lower chest: No acute abnormality. Mild right breast skin thickening. Hepatobiliary: No focal liver abnormality is seen. No gallstones, gallbladder wall thickening, or biliary dilatation. Pancreas:  Unremarkable. No pancreatic ductal dilatation or surrounding inflammatory changes. Spleen: Normal in size without focal abnormality. Adrenals/Urinary Tract: Mild left adrenal thickening. Kidneys are normal, without renal calculi, focal lesion, or hydronephrosis. Bladder is unremarkable. Stomach/Bowel: Stomach is within normal limits. Appendix appears normal. No evidence of bowel wall thickening, distention, or inflammatory changes. Vascular/Lymphatic: No significant vascular findings are present. No enlarged abdominal or pelvic lymph nodes. Reproductive: Mildly enlarged uterus.  No adnexal mass. Other: No abdominal wall hernia or abnormality. No abdominopelvic ascites. Musculoskeletal: Degenerate disc disease of the lumbar spine prominent at L4-L5 and L5-S1. No acute osseous abnormality. IMPRESSION: 1.  No CT evidence of acute abdominal/pelvic process. 2. Mild left adrenal nodular thickening, which likely represent small adrenal adenoma. 3. Bowel loops are normal in caliber. Normal appendix. No evidence of colitis or diverticulitis. 3. Mildly enlarged globular uterus, pelvic sonogram for further evaluation would be helpful on a nonemergent basis. 4. 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 Electronically Signed   By: Keane Police D.O.   On: 12/27/2021 23:12   DG Chest 2 View  Result Date: 12/27/2021 CLINICAL DATA:  Cough, chest pain and shortness of breath. EXAM: CHEST - 2 VIEW COMPARISON:  November 09, 2021 FINDINGS: There is stable left-sided venous Port-A-Cath positioning. The heart size and mediastinal contours are within normal limits. Both lungs are clear. The visualized skeletal structures are unremarkable. IMPRESSION: No active cardiopulmonary disease. Electronically Signed   By: Virgina Norfolk M.D.   On: 12/27/2021 21:34    ASSESSMENT & PLAN:   Carcinoma of upper-outer quadrant of right breast in female, estrogen receptor positive ([place]) #  MAY 2023- STAGE III-T4N1 ER/PR positive HER2 POSITIVE right breast cancer;LN-positive-ER/PR positive however 2 NEGATIVE s/p biopsy [see discussion below].  Discussed with Dr. Sedalia Muta tumor heterogeneity. BREAST MRI- JUNE 2023- The patient's known malignancy in the upper outer quadrant of the right breast measures 3.8 x 2.8 x 3.6 cm. Numerous other suspicious masses are identified in the right breast as above involving the upper outer and upper inner quadrants, located both anterior and posterior to the known malignancy.  Numerous enhancing foci in the skin as above are worrisome for skin metastases; Two mildly prominent right internal mammary nodes were not FDG avid on recent PET-CT imaging.    # Currently on neoadjuvant chemotherapy- TCH  plus P x3   MUGA scan May 31st, 2023- -61%. 3rd, AUG 2023-mammogram/ultrasound -Slightly decreased size of the palpable biopsy-proven malignancy in the right breast 10 o'clock position, measuring up to 2.0 cm and previously measuring up to 2.9 cm;  Persistent right breast skin thickening;  Decreased right axillary lymphadenopathy.  Updated Dr. Bary Castilla.  # Proceed with cycle #4   d-2udenyca.  Discussed regarding use of Claritin post growth factor. Labs today reviewed;  acceptable for treatment today.   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    # PN G-1- sec to Taxotere- monitor for now. STABLE.   #Mucositis grade 1 improved with-salt water baking soda rinses; Magic mouthwash- STABLE.   # DISPOSITION: # add iron studies/ferritin to labs today;   # proceed with Chemo  Today; D-2 Ellen Henri as planned.  # Follow up in 10 days- NP- labs- cbc/bmp; possible 1 lit over 1 hour  # follow up in 3 weeks-MD: labs- cbc/cmp; Vit D 25-OH levels; Taxotere+ Caboplatin+herceptin+Perjeta; D-2 Ellen Henri; Dr.B    All questions were answered. The patient/family knows to call the clinic with any problems, questions or  concerns.       Cammie Sickle, MD 01/12/2022 7:43 PM

## 2022-01-12 NOTE — Patient Instructions (Signed)
[facility]  Discharge Instructions: Thank you for choosing [facility] to provide your oncology and hematology care.   If you have a lab appointment with the Cancer Center, please go directly to the Cancer Center and check in at the registration area.   Wear comfortable clothing and clothing appropriate for easy access to any Portacath or PICC line.   We strive to give you quality time with your provider. You may need to reschedule your appointment if you arrive late (15 or more minutes).  Arriving late affects you and other patients whose appointments are after yours.  Also, if you miss three or more appointments without notifying the office, you may be dismissed from the clinic at the provider's discretion.      For prescription refill requests, have your pharmacy contact our office and allow 72 hours for refills to be completed.    Today you received the following chemotherapy and/or immunotherapy agents       To help prevent nausea and vomiting after your treatment, we encourage you to take your nausea medication as directed.  BELOW ARE SYMPTOMS THAT SHOULD BE REPORTED IMMEDIATELY: *FEVER GREATER THAN 100.4 F (38 C) OR HIGHER *CHILLS OR SWEATING *NAUSEA AND VOMITING THAT IS NOT CONTROLLED WITH YOUR NAUSEA MEDICATION *UNUSUAL SHORTNESS OF BREATH *UNUSUAL BRUISING OR BLEEDING *URINARY PROBLEMS (pain or burning when urinating, or frequent urination) *BOWEL PROBLEMS (unusual diarrhea, constipation, pain near the anus) TENDERNESS IN MOUTH AND THROAT WITH OR WITHOUT PRESENCE OF ULCERS (sore throat, sores in mouth, or a toothache) UNUSUAL RASH, SWELLING OR PAIN  UNUSUAL VAGINAL DISCHARGE OR ITCHING   Items with * indicate a potential emergency and should be followed up as soon as possible or go to the Emergency Department if any problems should occur.  Please show the CHEMOTHERAPY ALERT CARD or IMMUNOTHERAPY ALERT CARD at check-in to the  Emergency Department and triage nurse.  Should you have questions after your visit or need to cancel or reschedule your appointment, please contact [facility]  Dept: [phone]  and follow the prompts.  Office hours are 8:00 a.m. to 4:30 p.m. Monday - Friday. Please note that voicemails left after 4:00 p.m. may not be returned until the following business day.  We are closed weekends and major holidays. You have access to a nurse at all times for urgent questions. Please call the main number to the clinic Dept: [phone] and follow the prompts.   For any non-urgent questions, you may also contact your provider using MyChart. We now offer e-Visits for anyone 18 and older to request care online for non-urgent symptoms. For details visit [email or web address].   Also download the MyChart app! Go to the app store, search "MyChart", open the app, select [facility], and log in with your MyChart username and password.  Masks are optional in the cancer centers. If you would like for your care team to wear a mask while they are taking care of you, please let them know. You may have one support person who is at least 47 years old accompany you for your appointments. 

## 2022-01-12 NOTE — Assessment & Plan Note (Addendum)
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  Discussed with Dr. Sedalia Muta tumor heterogeneity. BREAST MRI- JUNE 2023- The patient's known malignancy in the upper outer quadrant of the right breast measures 3.8 x 2.8 x 3.6 cm. Numerous other suspicious masses are identified in the right breast as above involving the upper outer and upper inner quadrants, located both anterior and posterior to the known malignancy.  Numerous enhancing foci in the skin as above are worrisome for skin metastases; Two mildly prominent right internal mammary nodes were not FDG avid on recent PET-CT imaging.    # Currently on neoadjuvant chemotherapy- TCH plus P x3   MUGA scan May 31st, 2023- -61%. 3rd, AUG 2023-mammogram/ultrasound -Slightly decreased size of the palpable biopsy-proven malignancy in the right breast 10 o'clock position, measuring up to 2.0 cm and previously measuring up to 2.9 cm;  Persistent right breast skin thickening;  Decreased right axillary lymphadenopathy.  Updated Dr. Bary Castilla.  # Proceed with cycle #4   d-2udenyca.  Discussed regarding use of Claritin post growth factor. Labs today reviewed;  acceptable for treatment today.   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    # PN G-1- sec to Taxotere- monitor for now. STABLE.   #Mucositis grade 1 improved with-salt water baking soda rinses; Magic mouthwash- STABLE.   # DISPOSITION: # add iron studies/ferritin to labs today;   # proceed with Chemo  Today; D-2 Ellen Henri as planned.  # Follow up in 10 days- NP- labs- cbc/bmp; possible 1 lit over 1 hour  # follow up in 3 weeks-MD: labs- cbc/cmp; Vit D 25-OH levels; Taxotere+ Caboplatin+herceptin+Perjeta; D-2 Ellen Henri; Dr.B

## 2022-01-13 ENCOUNTER — Inpatient Hospital Stay: Payer: 59

## 2022-01-13 ENCOUNTER — Other Ambulatory Visit: Payer: Self-pay

## 2022-01-13 DIAGNOSIS — Z170 Estrogen receptor positive status [ER+]: Secondary | ICD-10-CM

## 2022-01-13 DIAGNOSIS — Z5112 Encounter for antineoplastic immunotherapy: Secondary | ICD-10-CM | POA: Diagnosis not present

## 2022-01-13 IMAGING — US US PELVIS COMPLETE WITH TRANSVAGINAL
1 series · 13 of 25 positions shown · non-contrast
Comparison: None

CLINICAL DATA: Abnormal uterine bleeding

EXAM:
TRANSABDOMINAL AND TRANSVAGINAL ULTRASOUND OF PELVIS
TECHNIQUE: Both transabdominal and transvaginal ultrasound examinations of the
pelvis were performed. Transabdominal technique was performed for
global imaging of the pelvis including uterus, ovaries, adnexal
regions, and pelvic cul-de-sac. It was necessary to proceed with
endovaginal exam following the transabdominal exam to visualize the
uterus endometrium adnexa.

[Series 1: us pelvis complete with transvaginal · 0.25mm/px · 73 acquisitions, 13 frames shown]
[im 1/73]
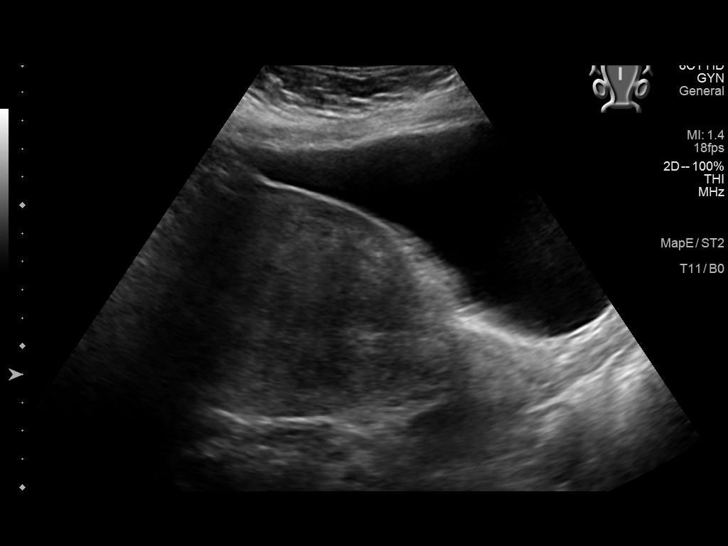
[im 7/73]
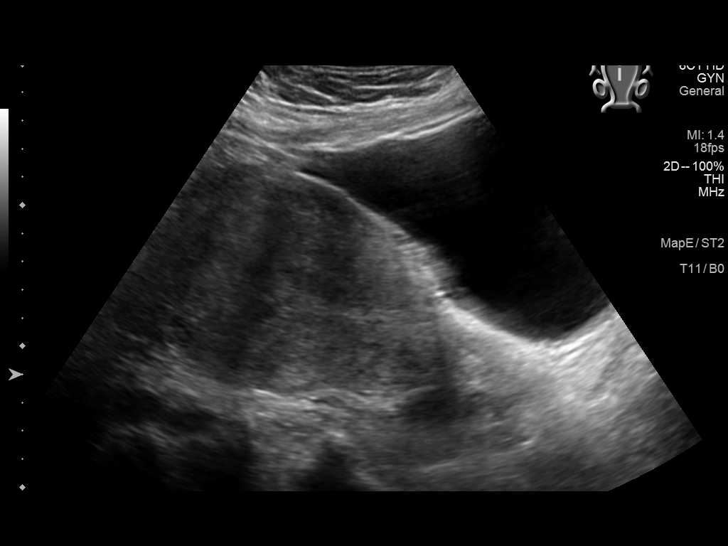
[im 13/73]
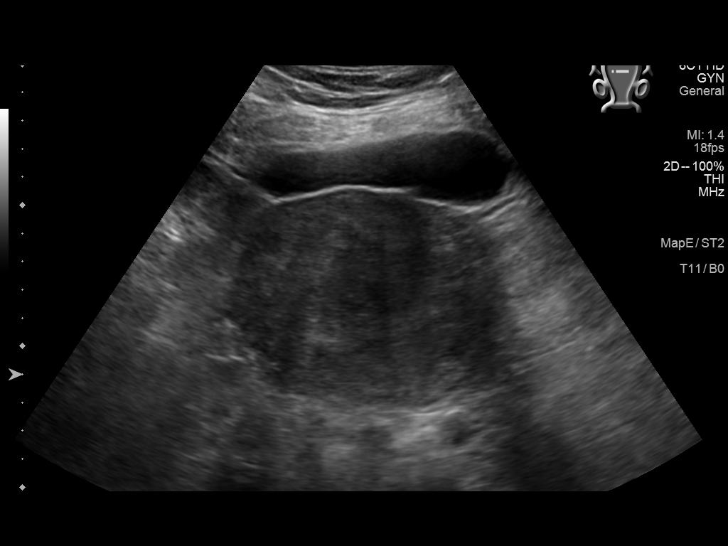
[im 19/73]
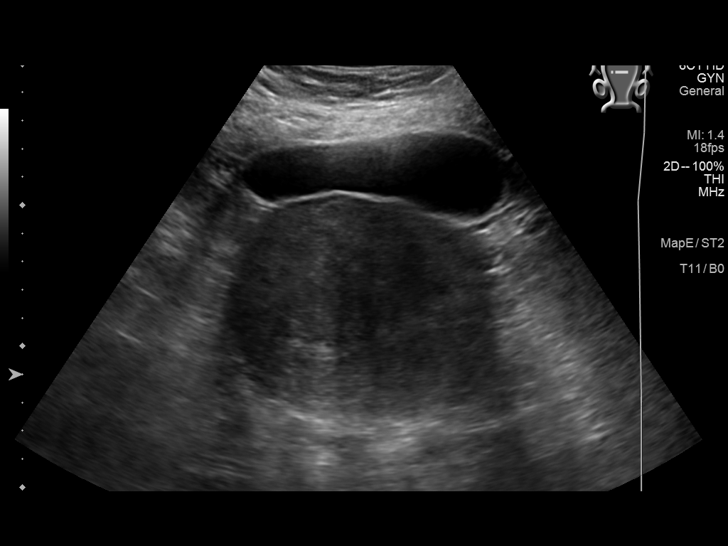
[im 25/73]
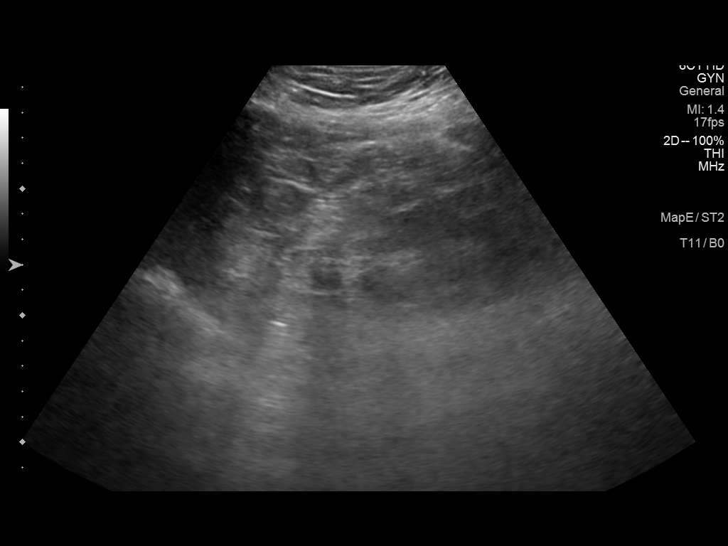
[im 31/73]
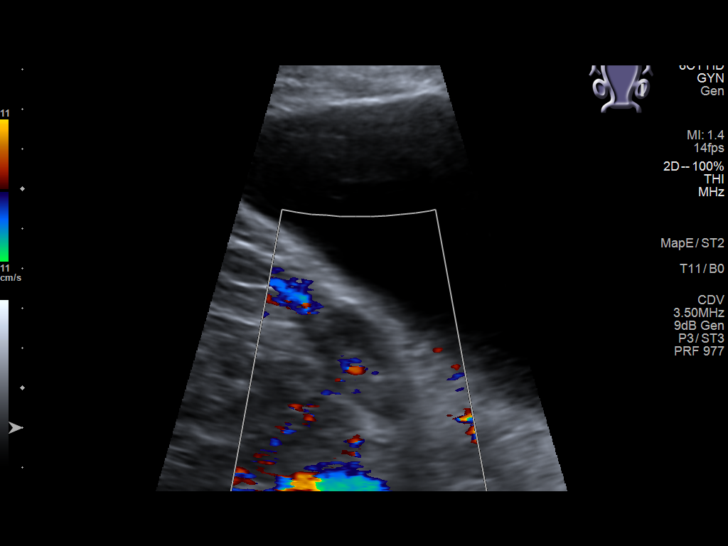
[im 37/73]
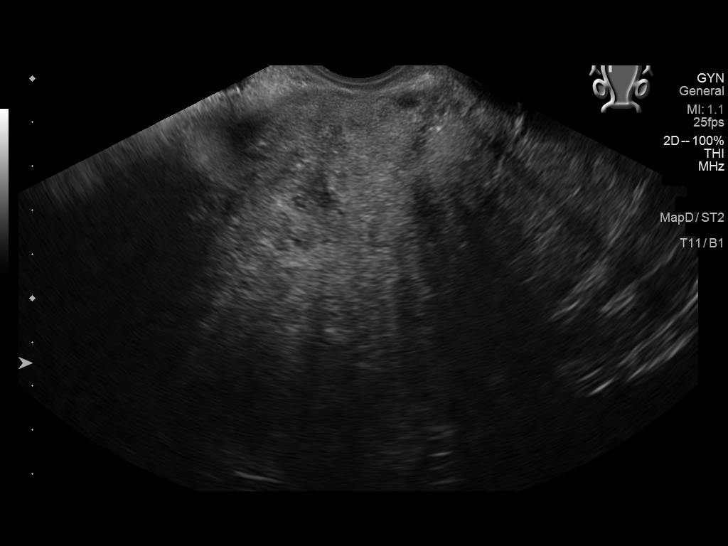
[im 43/73]
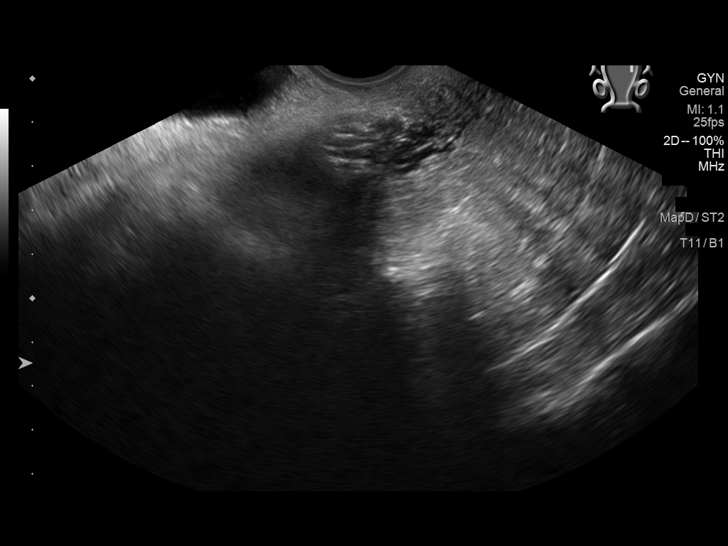
[im 49/73]
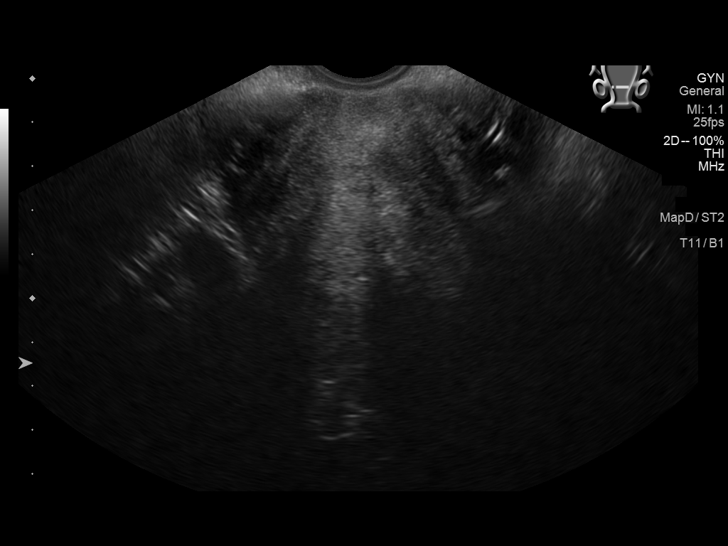
[im 55/73]
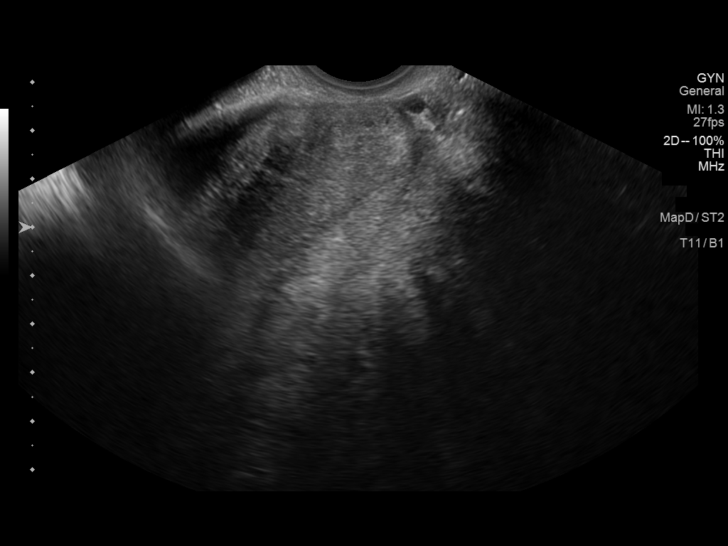
[im 61/73]
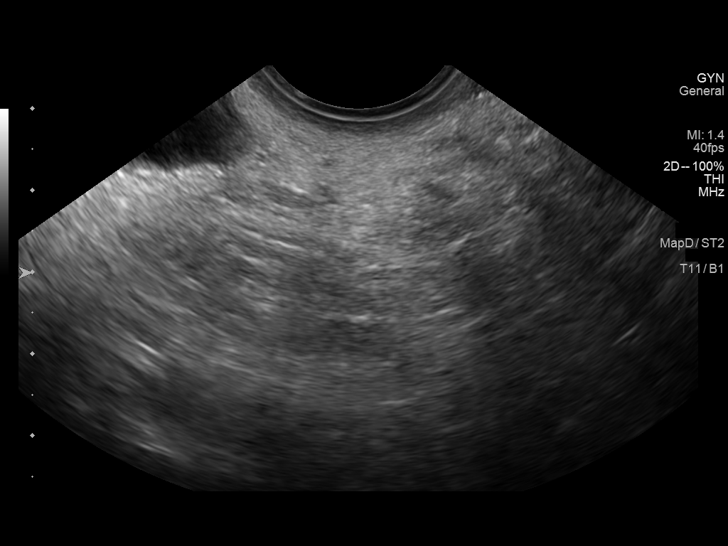
[im 67/73]
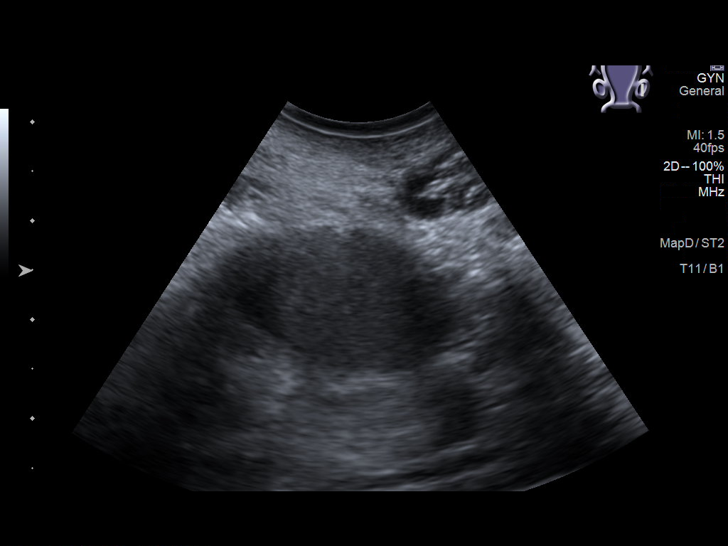
[im 73/73]
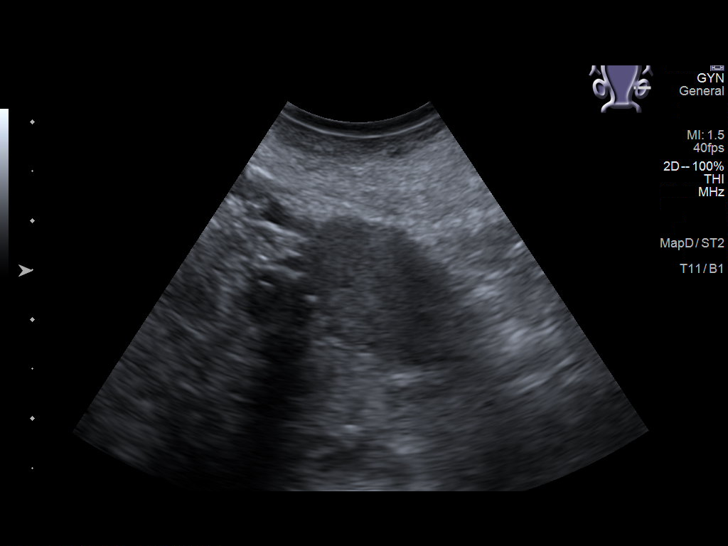

[13 of 25 positions shown; findings below may reference images not displayed]

FINDINGS: Uterus

Measurements: 13.9 x 7.7 x 9.7 cm = volume: 541.7 mL. Bulky
appearing fundus with heterogeneous myometrial echotexture.

Endometrium

Thickness: 14 mm.  Poorly defined endometrial myometrial interface.

Right ovary

Not seen

Left ovary

Measurements: 3.5 x 2.5 x 1.9 cm = volume: 8.6 mL. Complex cyst with
homogeneous internal echoes measuring 2.3 x 1.4 x 1.6 cm.

Other findings

No abnormal free fluid.
IMPRESSION: 1. Enlarged uterus with bulky enlargement of fundus and
heterogeneous myometrial echotexture suspicious for adenomyosis.
2. Endometrial thickness of 14 mm. If bleeding remains unresponsive
to hormonal or medical therapy, sonohysterogram should be considered
for focal lesion work-up. (Ref: Radiological Reasoning: Algorithmic
Workup of Abnormal Vaginal Bleeding with Endovaginal Sonography and
Sonohysterography. AJR [id number]; 191:S68-73)
3. 2.3 cm complex left adnexal cyst suggestive of hemorrhagic cyst
versus endometrioma. Recommend sonographic follow-up in 6-12 weeks.

## 2022-01-13 MED ORDER — PEGFILGRASTIM INJECTION 6 MG/0.6ML ~~LOC~~
6.0000 mg | PREFILLED_SYRINGE | Freq: Once | SUBCUTANEOUS | Status: AC
  Administered 2022-01-13: 6 mg via SUBCUTANEOUS
  Filled 2022-01-13: qty 0.6

## 2022-01-18 ENCOUNTER — Encounter: Payer: Self-pay | Admitting: Internal Medicine

## 2022-01-22 ENCOUNTER — Ambulatory Visit: Payer: 59

## 2022-01-22 ENCOUNTER — Encounter: Payer: Self-pay | Admitting: Medical Oncology

## 2022-01-22 ENCOUNTER — Inpatient Hospital Stay: Payer: 59 | Admitting: Medical Oncology

## 2022-01-22 ENCOUNTER — Inpatient Hospital Stay: Payer: 59

## 2022-01-22 VITALS — BP 111/61 | HR 109 | Temp 97.7°F | Resp 16

## 2022-01-22 DIAGNOSIS — E860 Dehydration: Secondary | ICD-10-CM

## 2022-01-22 DIAGNOSIS — C50411 Malignant neoplasm of upper-outer quadrant of right female breast: Secondary | ICD-10-CM | POA: Diagnosis not present

## 2022-01-22 DIAGNOSIS — Z5112 Encounter for antineoplastic immunotherapy: Secondary | ICD-10-CM | POA: Diagnosis not present

## 2022-01-22 DIAGNOSIS — Z170 Estrogen receptor positive status [ER+]: Secondary | ICD-10-CM

## 2022-01-22 DIAGNOSIS — H1033 Unspecified acute conjunctivitis, bilateral: Secondary | ICD-10-CM

## 2022-01-22 DIAGNOSIS — R634 Abnormal weight loss: Secondary | ICD-10-CM | POA: Diagnosis not present

## 2022-01-22 LAB — BASIC METABOLIC PANEL
Anion gap: 9 (ref 5–15)
BUN: 10 mg/dL (ref 6–20)
CO2: 27 mmol/L (ref 22–32)
Calcium: 9.1 mg/dL (ref 8.9–10.3)
Chloride: 102 mmol/L (ref 98–111)
Creatinine, Ser: 1.03 mg/dL — ABNORMAL HIGH (ref 0.44–1.00)
GFR, Estimated: >60 mL/min (ref >60)
Glucose, Bld: 183 mg/dL — ABNORMAL HIGH (ref 70–99)
Potassium: 3.8 mmol/L (ref 3.5–5.1)
Sodium: 138 mmol/L (ref 135–145)

## 2022-01-22 MED ORDER — SODIUM CHLORIDE 0.9 % IV SOLN
Freq: Once | INTRAVENOUS | Status: AC
  Filled 2022-01-22: qty 250

## 2022-01-22 MED ORDER — SODIUM CHLORIDE 0.9% FLUSH
10.0000 mL | Freq: Once | INTRAVENOUS | Status: AC
  Administered 2022-01-22: 10 mL via INTRAVENOUS
  Filled 2022-01-22: qty 10

## 2022-01-22 MED ORDER — ERYTHROMYCIN 5 MG/GM OP OINT: 1.0000 | TOPICAL_OINTMENT | Freq: Two times a day (BID) | OPHTHALMIC | 0 refills | Status: DC

## 2022-01-22 MED ORDER — HEPARIN SOD (PORK) LOCK FLUSH 100 UNIT/ML IV SOLN
500.0000 [IU] | Freq: Once | INTRAVENOUS | Status: AC
  Administered 2022-01-22: 500 [IU] via INTRAVENOUS
  Filled 2022-01-22: qty 5

## 2022-01-22 NOTE — Progress Notes (Addendum)
[facility] [place] NOTE  Patient Care Team: Latanya Maudlin, NP as PCP - General (Family Medicine) Daiva Huge, RN as Oncology Nurse Navigator Cammie Sickle, [name]  CHIEF COMPLAINTS/PURPOSE OF CONSULTATION: Breast cancer  #  Oncology History Overview Note  MAY 2023-    Targeted ultrasound is performed, showing a 2.9 x 1.9 x 2.0 cm irregular hypoechoic mass right breast 10 o'clock position 6 cm from nipple at the site of palpable concern.   There is an abnormal 8 mm thickened right axillary lymph node.   There is skin thickening overlying the right breast. ------------------  A. BREAST, RIGHT AT 10:00, 6 CM FROM THE NIPPLE; ULTRASOUND-GUIDED CORE  NEEDLE BIOPSY:  - INVASIVE MAMMARY CARCINOMA, NO SPECIAL TYPE.   Size of invasive carcinoma: 11 mm in this sample  Histologic grade of invasive carcinoma: Grade 2                       Glandular/tubular differentiation score: 3                       Nuclear pleomorphism score: 2                       Mitotic rate score: 1                       Total score: 6  Ductal carcinoma in situ: Present, intermediate grade  Lymphovascular invasion: Not identified   B. LYMPH NODE, RIGHT AXILLARY; ULTRASOUND-GUIDED CORE NEEDLE BIOPSY:  - 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 IN GREATEST  EXTENT.   Comment:  The malignancy in the primary breast lesion appears morphologically  different from tumor in the lymph node sampling, with tumor present in  lymph node more suggestive of a histologic grade 3, with significantly  increased mitotic activity and pleomorphism. Due to the discrepancy in  these morphologic patterns, ER/PR/HER2 immunohistochemistry will be  performed on both blocks A1 and B1, with reflex to [place] for HER2 2+. The  results will be reported in an addendum.   ADDENDUM:  CASE SUMMARY: BREAST BIOMARKER TESTS - A - BREAST, RIGHT AT 10:00  Estrogen Receptor  (ER) Status: POSITIVE          Percentage of cells with nuclear positivity: 71-80%          Average intensity of staining: Strong   Progesterone Receptor (PgR) Status: POSITIVE          Percentage of cells with nuclear positivity: 81-90%          Average intensity of staining: Strong   HER2 (by immunohistochemistry): POSITIVE (Score 3+)       Percentage of cells with uniform intense complete membrane  staining: 61-70%  HER2 displays a heterogeneous staining pattern, with areas showing a  strong 3+ staining pattern, and other regions with complete absence (0+)  of staining.   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  Discussed with Dr. Sed[place] Muta tumor heterogeneity. BREAST MRI- JUNE 2023- The patient's known malignancy in the upper outer quadrant of the right breast measures 3.8 x 2.8 x 3.6 cm. Numerous other suspicious masses are identified in the right breast as above involving the upper outer and upper inner quadrants, located both anterior and posterior to the known malignancy.  Numerous enhancing foci in the skin as above are worrisome for skin metastases ; Bx proven. Biopsy proven metastatic node in the right axilla.  No evidence of malignancy in the left breast;  Two mildly prominent right internal mammary nodes were not FDG avid on recent PET-CT imaging.  Currently on neoadjuvant chemotherapy- TCH  plus P x6 cycles;  MUGA scan May 31st, 2023- -61%.    # June 7th, 2023- NEO-ADJUVANT CHEMO- TCH+P   # Genetic counseling s/p- NEG for any deleterious mutations.     Carcinoma of upper-outer quadrant of right breast in female, estrogen receptor positive ([place])  10/26/2021 Initial Diagnosis   Carcinoma of upper-outer quadrant of right breast in female, estrogen receptor positive ([place])   10/26/2021 Cancer Staging   Staging form: Breast, AJCC 8th Edition - Clinical: Stage IB (cT2, cN1, cM0, G2, ER+, PR+, HER2+) - Signed by Cammie Sickle, MD on 10/26/2021 Histologic grading system: 3 grade system   11/11/2021 -  Chemotherapy   Patient is on Treatment Plan : BREAST  Docetaxel + Carboplatin + Trastuzumab + Pertuzumab  (TCHP) q21d       Genetic Testing   Negative genetic testing. No pathogenic variants identified on the [facility] CancerNext-Expanded+RNA panel. The report date is 11/29/2021.  The CancerNext-Expanded + RNAinsight gene panel offered by [employer] and includes sequencing and rearrangement analysis for the following 77 genes: IP, ALK, APC*, ATM*, AXIN2, BAP1, BARD1, BLM, BMPR1A, BRCA1*, BRCA2*, BRIP1*, CDC73, CDH1*,CDK4, CDKN1B, CDKN2A, CHEK2*, CTNNA1, DICER1, FANCC, FH, FLCN, GALNT12, KIF1B, LZTR1, MAX, MEN1, MET, MLH1*, MSH2*, MSH3, MSH6*, MUTYH*, NBN, NF1*, NF2, NTHL1, PALB2*, PHOX2B, PMS2*, POT1, PRKAR1A, PTCH1, PTEN*, RAD51C*, RAD51D*,RB1, RECQL, RET, SDHA, SDHAF2, SDHB, SDHC, SDHD, SMAD4, SMARCA4, SMARCB1, SMARCE1, STK11, SUFU, TMEM127, TP53*,TSC1, TSC2, VHL and XRCC2 (sequencing and deletion/duplication); EGFR, EGLN1, HOXB13, KIT, MITF, PDGFRA, POLD1 and POLE (sequencing only); EPCAM and GREM1 (deletion/duplication only).     HISTORY OF PRESENTING ILLNESS: Patient is ambulating independently.  Accompanied by her husband.   Marilyn Page 46 y.o.  female right breast TRIPLE POSITIVE with T4- Stage III currently on neoadjuvant TCH plus P chemotherapy is here for possible  fluids  She is S/post neoadjuvant TCH plus P cycle #4.  This last cycle she noticed more tearing from her eyes along with a crusty eye discharge. No vision changes, eye pain, fevers. Food is not tasting well but she is trying to eat what she can- milkshakes/popsicles. Weight is down 4 pounds which she is happy about as she would like to lose some weight. Mild numbness/tingling is stable.   Review of Systems  Constitutional:  Negative for chills, diaphoresis, fever, malaise/fatigue and weight loss.  HENT:  Negative for nosebleeds and sore throat.   Eyes:  Negative for double vision.  Respiratory:  Negative for cough, hemoptysis, sputum production, shortness of breath and wheezing.   Cardiovascular:  Negative for chest pain, palpitations, orthopnea and  leg swelling.  Gastrointestinal:  Negative for abdominal pain, blood in stool, constipation, diarrhea, heartburn, melena, nausea and vomiting.  Genitourinary:  Negative for dysuria, frequency and urgency.  Musculoskeletal:  Negative for back pain and joint pain.  Skin: Negative.  Negative for itching and rash.  Neurological:  Negative for dizziness, tingling, focal weakness, weakness and headaches.  Endo/Heme/Allergies:  Does not bruise/bleed easily.  Psychiatric/Behavioral:  Negative for depression. The patient is not nervous/anxious and does not have insomnia.      MEDICAL HISTORY:  Past Medical History:  Diagnosis Date   Carcinoma of upper-outer quadrant of right breast in female, estrogen receptor positive ([place]) 10/26/2021   Diabetes mellitus without complication ([place])    History of gestational diabetes    Personal history of chemotherapy     SURGICAL HISTORY: Past Surgical History:  Procedure Laterality Date   BREAST BIOPSY Right 10/19/2021   [place] Core bx 10:00 6 cmfn Ribbon Clip-INVASIVE MAMMARY CARCINOMA,. Axilla Butterfly hydro clip-MACRO METASTATIC MAMMARY CARCINOMA   CESAREAN SECTION  2005/2008   DILATION AND CURETTAGE OF UTERUS   2003   PORTACATH PLACEMENT Left 11/09/2021   Procedure: INSERTION PORT-A-CATH;  Surgeon: Robert Bellow, MD;  Location: [facility] ORS;  Service: General;  Laterality: Left;   SKIN BIOPSY Right 11/09/2021   Procedure: PUNCH BIOPSY SKIN, TATTOO LYMPH NODE RIGHT AXILLA;  Surgeon: Robert Bellow, MD;  Location: [facility] ORS;  Service: General;  Laterality: Right;    SOCIAL HISTORY: Social History   Socioeconomic History   Marital status: Married    Spouse name: Jaci Standard   Number of children: 2   Years of education: Not on file   Highest education level: Not on file  Occupational History   Occupation: and Optometrist  Tobacco Use   Smoking status: Never   Smokeless tobacco: Never  Vaping Use   Vaping Use: Never used  Substance and Sexual Activity   Alcohol use: Never   Drug use: Never   Sexual activity: Yes    Birth control/protection: Other-see comments, Surgical    Comment: vasectomy  Other Topics Concern   Not on file  Social History Narrative   Pre-school teacher; in [place]; 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    Social Determinants of Health   Financial Resource Strain: Not on file  Food Insecurity: Not on file  Transportation Needs: Not on file  Physical Activity: Inactive (09/26/2017)   Exercise Vital Sign    Days of Exercise per Week: 0 days    Minutes of Exercise per Session: 0 min  Stress: Not on file  Social Connections: Not on file  Intimate Partner Violence: Not on file    FAMILY HISTORY: Family History  Problem Relation Age of Onset   Ovarian cysts Mother    Migraines Mother    Melanoma Mother        on leg   Hyperlipidemia Father    Diabetes Maternal Aunt    Stomach cancer Paternal Aunt    Diabetes Maternal Grandfather    Lymphoma Cousin 17   Breast cancer Neg Hx     ALLERGIES:  is allergic to metformin.  MEDICATIONS:  Current Outpatient Medications  Medication Sig Dispense Refill   acetaminophen (TYLENOL) 325 MG tablet Take 2 tablets (650 mg  total) by mouth every 6 (six) hours as needed for mild pain (or Fever >/= 101).     alum & mag hydroxide-simeth (MAALOX/MYLANTA) 200-200-20 MG/5ML suspension Take 30 mLs by mouth every 4 (four) hours as needed for indigestion.  355 mL 0   ascorbic acid (VITAMIN C) 500 MG tablet Take 1 tablet (500 mg total) by mouth daily. 30 tablet    calcium-vitamin D (OSCAL WITH D) 500-5 MG-MCG tablet Take 1 tablet by mouth 2 (two) times daily. 60 tablet 2   chlorpheniramine-HYDROcodone 10-8 MG/5ML Take 5 mLs by mouth every 12 (twelve) hours as needed for cough. 115 mL 0   dexamethasone (DECADRON) 4 MG tablet One pill twice a day - Start the day prior to chemo; do not take the day of chemo. (Patient taking differently: Take 4 mg by mouth 2 (two) times daily. Start the day prior to chemo; do not take the day of chemo.) 40 tablet 0   erythromycin ophthalmic ointment Place 1 Application into both eyes 2 (two) times daily. 3.5 g 0   feeding supplement (ENSURE ENLIVE / ENSURE PLUS) LIQD Take 237 mLs by mouth 3 (three) times daily between meals. 237 mL 12   ferrous sulfate 325 (65 FE) MG tablet Take 325 mg by mouth 2 (two) times daily with a meal.     guaiFENesin (MUCINEX) 600 MG 12 hr tablet Take 2 tablets (1,200 mg total) by mouth 2 (two) times daily.     Ipratropium-Albuterol (COMBIVENT) 20-100 MCG/ACT AERS respimat Inhale 1 puff into the lungs every 6 (six) hours. 4 g 0   lidocaine-prilocaine (EMLA) cream Apply on the port. 30 -45 min  prior to port access. 30 g 3   loperamide (IMODIUM) 2 MG capsule Take 1 capsule (2 mg total) by mouth as needed for diarrhea or loose stools. 30 capsule 0   magic mouthwash w/lidocaine SOLN Take 5 mLs by mouth 4 (four) times daily as needed for mouth pain. Sig: Swish/Swallow 5-10 ml four times a day as needed. Dispense 480 ml. 1RF 480 mL 1   Multiple Vitamin (MULTIVITAMIN WITH MINERALS) TABS tablet Take 1 tablet by mouth daily.     norethindrone (MICRONOR) 0.35 MG tablet Take 1 tablet  (0.35 mg total) by mouth daily. 84 tablet 2   ONETOUCH VERIO test strip SMARTSIG:1 Each Via Meter Daily PRN     phenol (CHLORASEPTIC) 1.4 % LIQD Use as directed 1 spray in the mouth or throat as needed for throat irritation / pain.  0   prochlorperazine (COMPAZINE) 10 MG tablet Take 1 tablet (10 mg total) by mouth every 6 (six) hours as needed for nausea or vomiting. 40 tablet 1   Semaglutide, 1 MG/DOSE, 4 MG/3ML SOPN Inject into the skin. Inject 1 mg (0.75 ml) subcutaneously once a week.     zinc sulfate 220 (50 Zn) MG capsule Take 1 capsule (220 mg total) by mouth daily.     No current facility-administered medications for this visit.   Facility-Administered Medications Ordered in Other Visits  Medication Dose Route Frequency Provider Last Rate Last Admin   heparin lock flush 100 unit/mL  500 Units Intravenous Once Malaney Mcbean M, PA-C          .  PHYSICAL EXAMINATION: ECOG PERFORMANCE STATUS: 0 - Asymptomatic  Vitals:   01/22/22 0942  BP: 111/61  Pulse: (!) 109  Resp: 16  Temp: 97.7 F (36.5 C)  SpO2: 100%    Physical Exam Vitals and nursing note reviewed.  HENT:     Head: Normocephalic and atraumatic.     Nose: Nose normal.     Mouth/Throat:     Pharynx: Oropharynx is clear.  Eyes:     Extraocular Movements: Extraocular movements intact.  Pupils: Pupils are equal, round, and reactive to light.     Comments: Mild erythema of bilateral conjunctiva. Mild crusted yellow debris of bilateral eyelashes. No edema.   Cardiovascular:     Rate and Rhythm: Normal rate and regular rhythm.  Pulmonary:     Comments: Decreased breath sounds bilaterally.  Abdominal:     Palpations: Abdomen is soft.  Musculoskeletal:        General: Normal range of motion.     Cervical back: Normal range of motion.  Skin:    General: Skin is warm.  Neurological:     General: No focal deficit present.     Mental Status: She is alert and oriented to person, place, and time.  Psychiatric:         Behavior: Behavior normal.        Judgment: Judgment normal.      LABORATORY DATA:  I have reviewed the data as listed Lab Results  Component Value Date   WBC 6.7 01/12/2022   HGB 10.6 (L) 01/12/2022   HCT 32.5 (L) 01/12/2022   MCV 91.5 01/12/2022   PLT 167 01/12/2022   Recent Labs    12/27/21 2105 12/28/21 0329 12/29/21 0401 12/30/21 0501 01/01/22 1012 01/12/22 0823 01/22/22 0935  NA 134*   < > 138 140 139 139 138  K 3.6   < > 3.4* 3.2* 3.3* 3.9 3.8  CL 104   < > 107 107 104 107 102  CO2 22   < > _0 GLUCOSE 192*   < > 99 92 108* 203* 183*  BUN 16   < > _1 CREATININE 0.93   < > 0.71 0.64 0.64 0.80 1.03*  CALCIUM 8.6*   < > 8.3* 8.3* 8.4* 9.5 9.1  GFRNONAA >60   < > >60 >60 >60 >60 >60  PROT 7.0  --  5.9* 5.9*  --  6.6  --   ALBUMIN 3.7  --  3.1* 3.0*  --  3.4*  --   AST 42*  --  29 30  --  30  --   ALT 51*  --  39 35  --  28  --   ALKPHOS 70  --  51 49  --  53  --   BILITOT 1.1  --  0.5 0.3  --  0.4  --   BILIDIR 0.2  --   --   --   --   --   --   IBILI 0.9  --   --   --   --   --   --    < > = values in this interval not displayed.    RADIOGRAPHIC STUDIES: I have personally reviewed the radiological images as listed and agreed with the findings in the report. MM DIAG BREAST TOMO UNI RIGHT  Result Date: 01/07/2022 CLINICAL DATA:  History of biopsy-proven right breast invasive ductal carcinoma and positive node. Status post 3 rounds chemotherapy, presenting for follow-up imaging to assess treatment response. EXAM: DIGITAL DIAGNOSTIC UNILATERAL RIGHT MAMMOGRAM WITH TOMOSYNTHESIS; ULTRASOUND RIGHT BREAST LIMITED TECHNIQUE: Right digital diagnostic mammography and breast tomosynthesis was performed.; Targeted ultrasound examination of the right breast was performed COMPARISON:  Previous exam(s). ACR Breast Density Category c: The breast tissue is heterogeneously dense, which may obscure small masses. FINDINGS: Mammogram: Redemonstrated mass  with associated architectural distortion in the right upper outer breast, with an associated ribbon shaped biopsy marking clip,  corresponding to biopsy-proven malignancy. Notably, accurate size measurement of the mass on mammogram is slightly limited given the irregular margin and surrounding dense breast tissue. Persistent right breast skin thickening. Decreased right axillary lymphadenopathy. Ultrasound: Targeted right breast ultrasound redemonstrates an irregular hypoechoic mass in the right breast 10 o'clock position 6 cm from the nipple. The mass measures up to 2.0 x 1.1 by 1.3 cm, previously measuring 2.9 x 1.9 x 2.0 cm in April 2023. Incidentally visualized benign simple cyst in the right breast 9:30 position measuring up to 3.4 x 1.3 x 3.3 cm. Targeted right axillary ultrasound demonstrates decreased right axillary lymphadenopathy, with abnormal cortical thickening up to 3 mm, previously 8 mm. IMPRESSION: 1. Slightly decreased size of the palpable biopsy-proven malignancy in the right breast 10 o'clock position, measuring up to 2.0 cm and previously measuring up to 2.9 cm. 2.  Persistent right breast skin thickening. 3.  Decreased right axillary lymphadenopathy. RECOMMENDATION: Recommend continued surgical and oncologic follow up. I have discussed the findings and recommendations with the patient. If applicable, a reminder letter will be sent to the patient regarding the next appointment. BI-RADS CATEGORY  6: Known biopsy-proven malignancy. Electronically Signed   By: Beryle Flock M.D.   On: 01/07/2022 11:00  US Breast Limited Uni Right Inc Axilla  Result Date: 01/07/2022 CLINICAL DATA:  History of biopsy-proven right breast invasive ductal carcinoma and positive node. Status post 3 rounds chemotherapy, presenting for follow-up imaging to assess treatment response. EXAM: DIGITAL DIAGNOSTIC UNILATERAL RIGHT MAMMOGRAM WITH TOMOSYNTHESIS; ULTRASOUND RIGHT BREAST LIMITED TECHNIQUE: Right digital  diagnostic mammography and breast tomosynthesis was performed.; Targeted ultrasound examination of the right breast was performed COMPARISON:  Previous exam(s). ACR Breast Density Category c: The breast tissue is heterogeneously dense, which may obscure small masses. FINDINGS: Mammogram: Redemonstrated mass with associated architectural distortion in the right upper outer breast, with an associated ribbon shaped biopsy marking clip, corresponding to biopsy-proven malignancy. Notably, accurate size measurement of the mass on mammogram is slightly limited given the irregular margin and surrounding dense breast tissue. Persistent right breast skin thickening. Decreased right axillary lymphadenopathy. Ultrasound: Targeted right breast ultrasound redemonstrates an irregular hypoechoic mass in the right breast 10 o'clock position 6 cm from the nipple. The mass measures up to 2.0 x 1.1 by 1.3 cm, previously measuring 2.9 x 1.9 x 2.0 cm in April 2023. Incidentally visualized benign simple cyst in the right breast 9:30 position measuring up to 3.4 x 1.3 x 3.3 cm. Targeted right axillary ultrasound demonstrates decreased right axillary lymphadenopathy, with abnormal cortical thickening up to 3 mm, previously 8 mm. IMPRESSION: 1. Slightly decreased size of the palpable biopsy-proven malignancy in the right breast 10 o'clock position, measuring up to 2.0 cm and previously measuring up to 2.9 cm. 2.  Persistent right breast skin thickening. 3.  Decreased right axillary lymphadenopathy. RECOMMENDATION: Recommend continued surgical and oncologic follow up. I have discussed the findings and recommendations with the patient. If applicable, a reminder letter will be sent to the patient regarding the next appointment. BI-RADS CATEGORY  6: Known biopsy-proven malignancy. Electronically Signed   By: Beryle Flock M.D.   On: 01/07/2022 11:00  CT Angio Chest Pulmonary Embolism (PE) W or WO Contrast  Result Date: 12/28/2021 CLINICAL  DATA:  High clinical suspicion for PE EXAM: CT ANGIOGRAPHY CHEST WITH CONTRAST TECHNIQUE: Multidetector CT imaging of the chest was performed using the standard protocol during bolus administration of intravenous contrast. Multiplanar CT image reconstructions and MIPs were obtained to evaluate  the vascular anatomy. RADIATION DOSE REDUCTION: This exam was performed according to the departmental dose-optimization program which includes automated exposure control, adjustment of the mA and/or kV according to patient size and/or use of iterative reconstruction technique. CONTRAST:  53m OMNIPAQUE IOHEXOL 350 MG/ML SOLN COMPARISON:  Previous studies including the chest radiograph done on 12/27/2021 FINDINGS: Cardiovascular: There is homogeneous enhancement in thoracic aorta. There are no intraluminal filling defects in pulmonary artery branches. There is ectasia of the main pulmonary artery measuring 3.8 cm suggesting pulmonary arterial hypertension. Mediastinum/Nodes: There are slightly enlarged lymph nodes in mediastinum. Lungs/Pleura: There is no focal pulmonary consolidation. There is no pleural effusion or pneumothorax. Upper Abdomen: There is fatty infiltration liver. Spleen measures 14 cm in AP diameter. Musculoskeletal: No acute findings are seen. Review of the MIP images confirms the above findings. IMPRESSION: There is no evidence of pulmonary artery embolism. There is no evidence of thoracic aortic dissection. There is no focal pulmonary consolidation. Fatty liver.  Enlarged spleen. Electronically Signed   By: PElmer PickerM.D.   On: 12/28/2021 13:51   CT ABDOMEN PELVIS W CONTRAST  Result Date: 12/27/2021 CLINICAL DATA:  Abdominal pain. Patient complains of cough fever for 2 days. EXAM: CT ABDOMEN AND PELVIS WITH CONTRAST TECHNIQUE: Multidetector CT imaging of the abdomen and pelvis was performed using the standard protocol following bolus administration of intravenous contrast. RADIATION DOSE  REDUCTION: This exam was performed according to the departmental dose-optimization program which includes automated exposure control, adjustment of the mA and/or kV according to patient size and/or use of iterative reconstruction technique. CONTRAST:  1026mOMNIPAQUE IOHEXOL 300 MG/ML  SOLN COMPARISON:  None Available. FINDINGS: Lower chest: No acute abnormality. Mild right breast skin thickening. Hepatobiliary: No focal liver abnormality is seen. No gallstones, gallbladder wall thickening, or biliary dilatation. Pancreas: Unremarkable. No pancreatic ductal dilatation or surrounding inflammatory changes. Spleen: Normal in size without focal abnormality. Adrenals/Urinary Tract: Mild left adrenal thickening. Kidneys are normal, without renal calculi, focal lesion, or hydronephrosis. Bladder is unremarkable. Stomach/Bowel: Stomach is within normal limits. Appendix appears normal. No evidence of bowel wall thickening, distention, or inflammatory changes. Vascular/Lymphatic: No significant vascular findings are present. No enlarged abdominal or pelvic lymph nodes. Reproductive: Mildly enlarged uterus.  No adnexal mass. Other: No abdominal wall hernia or abnormality. No abdominopelvic ascites. Musculoskeletal: Degenerate disc disease of the lumbar spine prominent at L4-L5 and L5-S1. No acute osseous abnormality. IMPRESSION: 1.  No CT evidence of acute abdominal/pelvic process. 2. Mild left adrenal nodular thickening, which likely represent small adrenal adenoma. 3. Bowel loops are normal in caliber. Normal appendix. No evidence of colitis or diverticulitis. 3. Mildly enlarged globular uterus, pelvic sonogram for further evaluation would be helpful on a nonemergent basis. 4. 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 Electronically Signed   By: ImKeane Police.O.   On: 12/27/2021 23:12   DG Chest 2 View  Result Date: 12/27/2021 CLINICAL DATA:  Cough, chest pain  and shortness of breath. EXAM: CHEST - 2 VIEW COMPARISON:  November 09, 2021 FINDINGS: There is stable left-sided venous Port-A-Cath positioning. The heart size and mediastinal contours are within normal limits. Both lungs are clear. The visualized skeletal structures are unremarkable. IMPRESSION: No active cardiopulmonary disease. Electronically Signed   By: ThVirgina Norfolk.D.   On: 12/27/2021 21:34    ASSESSMENT & PLAN:  Encounter Diagnoses  Name Primary?   Acute bacterial conjunctivitis of both eyes Yes  Carcinoma of upper-outer quadrant of right breast in female, estrogen receptor positive ([place])    Dehydration    Weight loss    Discussed with patient that her sys related symptoms are common with Docetaxel. Discussed red flags- eye pain/vision changes- those would require ophthalmology visit. For now treating with erythromycin ointment to help with her symptoms and what appears to be a starting bacterial conjunctivitis from itching her eyes.   Reviewed her labs- creatinine has increased slightly from 0.8 to 1.03. I have advised IVF today which she is agreeable to. Continue pushing oral food and liquid intake- discussed higher fat foods may taste better than other.    All questions were answered. The patient/family knows to call the clinic with any problems, questions or concerns.       Hughie Closs, PA-C 01/22/2022 10:55 AM

## 2022-01-22 NOTE — Progress Notes (Signed)
Pt receiving 1 L NS over 1 hour. Developed a nose bleed. Sarah PA assessing patient.

## 2022-02-01 MED FILL — Dexamethasone Sodium Phosphate Inj 100 MG/10ML: INTRAMUSCULAR | Qty: 1 | Status: AC

## 2022-02-02 ENCOUNTER — Encounter: Payer: Self-pay | Admitting: Internal Medicine

## 2022-02-02 ENCOUNTER — Inpatient Hospital Stay: Payer: 59

## 2022-02-02 ENCOUNTER — Inpatient Hospital Stay (HOSPITAL_BASED_OUTPATIENT_CLINIC_OR_DEPARTMENT_OTHER): Payer: 59 | Admitting: Internal Medicine

## 2022-02-02 VITALS — BP 80/54 | HR 88 | Temp 98.1°F | Ht 65.0 in | Wt 221.4 lb

## 2022-02-02 VITALS — BP 101/46 | HR 64

## 2022-02-02 DIAGNOSIS — C50411 Malignant neoplasm of upper-outer quadrant of right female breast: Secondary | ICD-10-CM

## 2022-02-02 DIAGNOSIS — Z170 Estrogen receptor positive status [ER+]: Secondary | ICD-10-CM | POA: Diagnosis not present

## 2022-02-02 DIAGNOSIS — Z5112 Encounter for antineoplastic immunotherapy: Secondary | ICD-10-CM | POA: Diagnosis not present

## 2022-02-02 LAB — CBC WITH DIFFERENTIAL/PLATELET
Abs Immature Granulocytes: 0.06 10*3/uL (ref 0.00–0.07)
Basophils Absolute: 0 10*3/uL (ref 0.0–0.1)
Basophils Relative: 0 %
Eosinophils Absolute: 0 10*3/uL (ref 0.0–0.5)
Eosinophils Relative: 0 %
HCT: 30.5 % — ABNORMAL LOW (ref 36.0–46.0)
Hemoglobin: 9.9 g/dL — ABNORMAL LOW (ref 12.0–15.0)
Immature Granulocytes: 1 %
Lymphocytes Relative: 10 %
Lymphs Abs: 0.7 10*3/uL (ref 0.7–4.0)
MCH: 31.9 pg (ref 26.0–34.0)
MCHC: 32.5 g/dL (ref 30.0–36.0)
MCV: 98.4 fL (ref 80.0–100.0)
Monocytes Absolute: 0.3 10*3/uL (ref 0.1–1.0)
Monocytes Relative: 4 %
Neutro Abs: 5.7 10*3/uL (ref 1.7–7.7)
Neutrophils Relative %: 85 %
Platelets: 128 10*3/uL — ABNORMAL LOW (ref 150–400)
RBC: 3.1 MIL/uL — ABNORMAL LOW (ref 3.87–5.11)
RDW: 20.6 % — ABNORMAL HIGH (ref 11.5–15.5)
WBC: 6.7 10*3/uL (ref 4.0–10.5)
nRBC: 0 % (ref 0.0–0.2)

## 2022-02-02 LAB — COMPREHENSIVE METABOLIC PANEL
ALT: 27 U/L (ref 0–44)
AST: 28 U/L (ref 15–41)
Albumin: 3.1 g/dL — ABNORMAL LOW (ref 3.5–5.0)
Alkaline Phosphatase: 45 U/L (ref 38–126)
Anion gap: 4 — ABNORMAL LOW (ref 5–15)
BUN: 13 mg/dL (ref 6–20)
CO2: 25 mmol/L (ref 22–32)
Calcium: 8.7 mg/dL — ABNORMAL LOW (ref 8.9–10.3)
Chloride: 108 mmol/L (ref 98–111)
Creatinine, Ser: 0.59 mg/dL (ref 0.44–1.00)
GFR, Estimated: >60 mL/min (ref >60)
Glucose, Bld: 175 mg/dL — ABNORMAL HIGH (ref 70–99)
Potassium: 3.6 mmol/L (ref 3.5–5.1)
Sodium: 137 mmol/L (ref 135–145)
Total Bilirubin: 0.5 mg/dL (ref 0.3–1.2)
Total Protein: 5.9 g/dL — ABNORMAL LOW (ref 6.5–8.1)

## 2022-02-02 LAB — VITAMIN D 25 HYDROXY (VIT D DEFICIENCY, FRACTURES): Vit D, 25-Hydroxy: 37.52 ng/mL (ref 30–100)

## 2022-02-02 MED ORDER — PALONOSETRON HCL INJECTION 0.25 MG/5ML
0.2500 mg | Freq: Once | INTRAVENOUS | Status: AC
  Administered 2022-02-02: 0.25 mg via INTRAVENOUS
  Filled 2022-02-02: qty 5

## 2022-02-02 MED ORDER — DEXAMETHASONE 4 MG PO TABS: ORAL_TABLET | ORAL | 0 refills | Status: DC

## 2022-02-02 MED ORDER — SODIUM CHLORIDE 0.9 % IV SOLN
150.0000 mg | Freq: Once | INTRAVENOUS | Status: AC
  Administered 2022-02-02: 150 mg via INTRAVENOUS
  Filled 2022-02-02: qty 5

## 2022-02-02 MED ORDER — SODIUM CHLORIDE 0.9 % IV SOLN
900.0000 mg | Freq: Once | INTRAVENOUS | Status: AC
  Administered 2022-02-02: 900 mg via INTRAVENOUS
  Filled 2022-02-02: qty 90

## 2022-02-02 MED ORDER — TRASTUZUMAB-DKST CHEMO 150 MG IV SOLR
6.0000 mg/kg | Freq: Once | INTRAVENOUS | Status: AC
  Administered 2022-02-02: 609 mg via INTRAVENOUS
  Filled 2022-02-02: qty 29

## 2022-02-02 MED ORDER — DIPHENHYDRAMINE HCL 25 MG PO CAPS
50.0000 mg | ORAL_CAPSULE | Freq: Once | ORAL | Status: AC
  Administered 2022-02-02: 50 mg via ORAL
  Filled 2022-02-02: qty 2

## 2022-02-02 MED ORDER — SODIUM CHLORIDE 0.9 % IV SOLN
10.0000 mg | Freq: Once | INTRAVENOUS | Status: AC
  Administered 2022-02-02: 10 mg via INTRAVENOUS
  Filled 2022-02-02: qty 1

## 2022-02-02 MED ORDER — HEPARIN SOD (PORK) LOCK FLUSH 100 UNIT/ML IV SOLN
500.0000 [IU] | Freq: Once | INTRAVENOUS | Status: AC | PRN
  Administered 2022-02-02: 500 [IU]
  Filled 2022-02-02: qty 5

## 2022-02-02 MED ORDER — SODIUM CHLORIDE 0.9 % IV SOLN
Freq: Once | INTRAVENOUS | Status: AC
  Filled 2022-02-02: qty 250

## 2022-02-02 MED ORDER — ACETAMINOPHEN 325 MG PO TABS
650.0000 mg | ORAL_TABLET | Freq: Once | ORAL | Status: AC
  Administered 2022-02-02: 650 mg via ORAL
  Filled 2022-02-02: qty 2

## 2022-02-02 MED ORDER — SODIUM CHLORIDE 0.9 % IV SOLN
420.0000 mg | Freq: Once | INTRAVENOUS | Status: AC
  Administered 2022-02-02: 420 mg via INTRAVENOUS
  Filled 2022-02-02: qty 14

## 2022-02-02 MED ORDER — SODIUM CHLORIDE 0.9 % IV SOLN
75.0000 mg/m2 | Freq: Once | INTRAVENOUS | Status: AC
  Administered 2022-02-02: 160 mg via INTRAVENOUS
  Filled 2022-02-02: qty 16

## 2022-02-02 MED FILL — Fosaprepitant Dimeglumine For IV Infusion 150 MG (Base Eq): INTRAVENOUS | Qty: 5 | Status: AC

## 2022-02-02 NOTE — Patient Instructions (Signed)
[facility] AT [place]  Discharge Instructions: Thank you for choosing [facility] to provide your oncology and hematology care.  If you have a lab appointment with the [facility], please go directly to the [facility] and check in at the registration area.  Wear comfortable clothing and clothing appropriate for easy access to any Portacath or PICC line.   We strive to give you quality time with your provider. You may need to reschedule your appointment if you arrive late (15 or more minutes).  Arriving late affects you and other patients whose appointments are after yours.  Also, if you miss three or more appointments without notifying the office, you may be dismissed from the clinic at the provider's discretion.      For prescription refill requests, have your pharmacy contact our office and allow 72 hours for refills to be completed.    Today you received the following chemotherapy and/or immunotherapy agents: Taxotere, Carboplatin, Herceptin, Perjeta      To help prevent nausea and vomiting after your treatment, we encourage you to take your nausea medication as directed.  BELOW ARE SYMPTOMS THAT SHOULD BE REPORTED IMMEDIATELY: *FEVER GREATER THAN 100.4 F (38 C) OR HIGHER *CHILLS OR SWEATING *NAUSEA AND VOMITING THAT IS NOT CONTROLLED WITH YOUR NAUSEA MEDICATION *UNUSUAL SHORTNESS OF BREATH *UNUSUAL BRUISING OR BLEEDING *URINARY PROBLEMS (pain or burning when urinating, or frequent urination) *BOWEL PROBLEMS (unusual diarrhea, constipation, pain near the anus) TENDERNESS IN MOUTH AND THROAT WITH OR WITHOUT PRESENCE OF ULCERS (sore throat, sores in mouth, or a toothache) UNUSUAL RASH, SWELLING OR PAIN  UNUSUAL VAGINAL DISCHARGE OR ITCHING   Items with * indicate a potential emergency and should be followed up as soon as possible or go to the Emergency Department if any problems should occur.  Please show the CHEMOTHERAPY ALERT CARD or  IMMUNOTHERAPY ALERT CARD at check-in to the Emergency Department and triage nurse.  Should you have questions after your visit or need to cancel or reschedule your appointment, please contact [facility] [place] AT [place]  [phone] and follow the prompts.  Office hours are 8:00 a.m. to 4:30 p.m. Monday - Friday. Please note that voicemails left after 4:00 p.m. may not be returned until the following business day.  We are closed weekends and major holidays. You have access to a nurse at all times for urgent questions. Please call the main number to the clinic [phone] and follow the prompts.  For any non-urgent questions, you may also contact your provider using MyChart. We now offer e-Visits for anyone 60 and older to request care online for non-urgent symptoms. For details visit [email or web address].   Also download the MyChart app! Go to the app store, search "MyChart", open the app, select , and log in with your MyChart username and password.  Masks are optional in the cancer centers. If you would like for your care team to wear a mask while they are taking care of you, please let them know. For doctor visits, patients may have with them one support person who is at least 47 years old. At this time, visitors are not allowed in the infusion area.

## 2022-02-02 NOTE — Assessment & Plan Note (Addendum)
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  Discussed with Dr. Sedalia Muta tumor heterogeneity. BREAST MRI- JUNE 2023- The patient's known malignancy in the upper outer quadrant of the right breast measures 3.8 x 2.8 x 3.6 cm. Numerous other suspicious masses are identified in the right breast as above involving the upper outer and upper inner quadrants, located both anterior and posterior to the known malignancy.  Numerous enhancing foci in the skin as above are worrisome for skin metastases; Two mildly prominent right internal mammary nodes were not FDG avid on recent PET-CT imaging.    # Currently on neoadjuvant chemotherapy- TCH plus P x3   MUGA scan May 31st, 2023- -61%. 3rd, AUG 2023-mammogram/ultrasound -Slightly decreased size of the palpable biopsy-proven malignancy in the right breast 10 o'clock position, measuring up to 2.0 cm and previously measuring up to 2.9 cm;  Persistent right breast skin thickening;  Decreased right axillary lymphadenopathy.  Updated Dr. Bary Castilla.  # Proceed with cycle #5   d-2udenyca.  Discussed regarding use of Claritin post growth factor. Labs today reviewed;  acceptable for treatment today.  We will plan to proceed with repeat CT imaging post therapy.  # Hypotension: Systolic 08X; unclear etiology- HR- 80s.  Asymptomatic.  Continue increased fluid intake.  #Weight gain/fluid retention-from underlying chemotherapy Taxotere; steroids.  Recommend taking dexamethasone 4 mg twice a day  postchemotherapy.  Patient currently getting none after chemotherapy.  # PN G-1-2 sec to Taxotere- monitor for now. STABLE.   # DISPOSITION: # proceed with Chemo  Today; D-2 neuslata as planned.  # Follow up in 10 days- NP- labs- cbc/bmp; possible 1 lit over 1 hour  # follow up in 3 weeks-MD: labs- cbc/cmp;  Taxotere+ Caboplatin+herceptin+Perjeta; D-2 neulasta; Dr.B

## 2022-02-02 NOTE — Progress Notes (Signed)
[facility] NOTE  Patient Care Team: Latanya Maudlin, NP as PCP - General (Family Medicine) Daiva Huge, RN as Oncology Nurse Navigator Cammie Sickle, [name]  CHIEF COMPLAINTS/PURPOSE OF CONSULTATION: Breast cancer  Oncology History Overview Note  MAY 2023-    Targeted ultrasound is performed, showing a 2.9 x 1.9 x 2.0 cm irregular hypoechoic mass right breast 10 o'clock position 6 cm from nipple at the site of palpable concern.   There is an abnormal 8 mm thickened right axillary lymph node.   There is skin thickening overlying the right breast. ------------------  A. BREAST, RIGHT AT 10:00, 6 CM FROM THE NIPPLE; ULTRASOUND-GUIDED CORE  NEEDLE BIOPSY:  - INVASIVE MAMMARY CARCINOMA, NO SPECIAL TYPE.   Size of invasive carcinoma: 11 mm in this sample  Histologic grade of invasive carcinoma: Grade 2                       Glandular/tubular differentiation score: 3                       Nuclear pleomorphism score: 2                       Mitotic rate score: 1                       Total score: 6  Ductal carcinoma in situ: Present, intermediate grade  Lymphovascular invasion: Not identified   B. LYMPH NODE, RIGHT AXILLARY; ULTRASOUND-GUIDED CORE NEEDLE BIOPSY:  - 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 IN GREATEST  EXTENT.   Comment:  The malignancy in the primary breast lesion appears morphologically  different from tumor in the lymph node sampling, with tumor present in  lymph node more suggestive of a histologic grade 3, with significantly  increased mitotic activity and pleomorphism. Due to the discrepancy in  these morphologic patterns, ER/PR/HER2 immunohistochemistry will be  performed on both blocks A1 and B1, with reflex to [place] for HER2 2+. The  results will be reported in an addendum.   ADDENDUM:  CASE SUMMARY: BREAST BIOMARKER TESTS - A - BREAST, RIGHT AT 10:00  Estrogen Receptor (ER)  Status: POSITIVE          Percentage of cells with nuclear positivity: 71-80%          Average intensity of staining: Strong   Progesterone Receptor (PgR) Status: POSITIVE          Percentage of cells with nuclear positivity: 81-90%          Average intensity of staining: Strong   HER2 (by immunohistochemistry): POSITIVE (Score 3+)       Percentage of cells with uniform intense complete membrane  staining: 61-70%  HER2 displays a heterogeneous staining pattern, with areas showing a  strong 3+ staining pattern, and other regions with complete absence (0+)  of staining.   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  Discussed with Dr. Sedalia Muta tumor heterogeneity. BREAST MRI- JUNE 2023- The patient's known malignancy in the upper outer quadrant of the right breast measures 3.8 x 2.8 x 3.6 cm. Numerous other suspicious masses are identified in the right breast as above involving the upper outer and upper inner quadrants, located both anterior and posterior to the known malignancy.  Numerous enhancing foci in the skin as above are worrisome for skin metastases ; Bx proven. Biopsy proven metastatic node in the right axilla.  No evidence of malignancy in the left breast;  Two mildly prominent right internal mammary nodes were not FDG avid on recent PET-CT imaging.  Currently on neoadjuvant chemotherapy- TCH plus  P x6 cycles;  MUGA scan May 31st, 2023- -61%.    # June 7th, 2023- NEO-ADJUVANT CHEMO- TCH+P   # Genetic counseling s/p- NEG for any deleterious mutations.     Carcinoma of upper-outer quadrant of right breast in female, estrogen receptor positive ([place])  10/26/2021 Initial Diagnosis   Carcinoma of upper-outer quadrant of right breast in female, estrogen receptor positive ([place])   10/26/2021 Cancer Staging   Staging form: Breast, AJCC 8th Edition - Clinical: Stage IB (cT2, cN1, cM0, G2, ER+, PR+, HER2+) - Signed by Cammie Sickle, MD on 10/26/2021 Histologic grading system: 3 grade system   11/10/2021 -  Chemotherapy   Patient is on Treatment Plan : BREAST  Docetaxel + Carboplatin + Trastuzumab + Pertuzumab  (TCHP) q21d      11/11/2021 - 01/13/2022 Chemotherapy   Patient is on Treatment Plan : BREAST  Docetaxel + Carboplatin + Trastuzumab + Pertuzumab  (TCHP) q21d       Genetic Testing   Negative genetic testing. No pathogenic variants identified on the [facility] CancerNext-Expanded+RNA panel. The report date is 11/29/2021.  The CancerNext-Expanded + RNAinsight gene panel offered by [employer] and includes sequencing and rearrangement analysis for the following 77 genes: IP, ALK, APC*, ATM*, AXIN2, BAP1, BARD1, BLM, BMPR1A, BRCA1*, BRCA2*, BRIP1*, CDC73, CDH1*,CDK4, CDKN1B, CDKN2A, CHEK2*, CTNNA1, DICER1, FANCC, FH, FLCN, GALNT12, KIF1B, LZTR1, MAX, MEN1, MET, MLH1*, MSH2*, MSH3, MSH6*, MUTYH*, NBN, NF1*, NF2, NTHL1, PALB2*, PHOX2B, PMS2*, POT1, PRKAR1A, PTCH1, PTEN*, RAD51C*, RAD51D*,RB1, RECQL, RET, SDHA, SDHAF2, SDHB, SDHC, SDHD, SMAD4, SMARCA4, SMARCB1, SMARCE1, STK11, SUFU, TMEM127, TP53*,TSC1, TSC2, VHL and XRCC2 (sequencing and deletion/duplication); EGFR, EGLN1, HOXB13, KIT, MITF, PDGFRA, POLD1 and POLE (sequencing only); EPCAM and GREM1 (deletion/duplication only).     HISTORY OF PRESENTING ILLNESS: Patient is ambulating independently.  Accompanied by her husband.   Marilyn Page  46 y.o.  female right breast TRIPLE POSITIVE with T4- Stage III currently on neoadjuvant TCH plus P chemotherapy is here for follow-up.  Patient currently status post neoadjuvant TCH plus P cycle #4.Complains of constipation.  Complains of blood in stool with straining.   Denies any episodes of diarrhea.  Denies any dizzy spells.  Patient continues to complains of mild tingling and numbness in extremities.  Also, leg swelling and weight gain. Complains of fatigue. No skin rash.   Review of Systems  Constitutional:  Negative for chills, diaphoresis, fever, malaise/fatigue and weight loss.  HENT:  Negative for nosebleeds and sore throat.   Eyes:  Negative for double vision.  Respiratory:  Negative for cough, hemoptysis, sputum production,  shortness of breath and wheezing.   Cardiovascular:  Negative for chest pain, palpitations, orthopnea and leg swelling.  Gastrointestinal:  Negative for abdominal pain, blood in stool, constipation, diarrhea, heartburn, melena, nausea and vomiting.  Genitourinary:  Negative for dysuria, frequency and urgency.  Musculoskeletal:  Negative for back pain and joint pain.  Skin: Negative.  Negative for itching and rash.  Neurological:  Negative for dizziness, tingling, focal weakness, weakness and headaches.  Endo/Heme/Allergies:  Does not bruise/bleed easily.  Psychiatric/Behavioral:  Negative for depression. The patient is not nervous/anxious and does not have insomnia.      MEDICAL HISTORY:  Past Medical History:  Diagnosis Date   Carcinoma of upper-outer quadrant of right breast in female, estrogen receptor positive ([place]) 10/26/2021   Diabetes mellitus without complication ([place])    History of gestational diabetes    Personal history of chemotherapy     SURGICAL HISTORY: Past Surgical History:  Procedure Laterality Date   BREAST BIOPSY Right 10/19/2021   [place] Core bx 10:00 6 cmfn Ribbon Clip-INVASIVE MAMMARY CARCINOMA,. Axilla Butterfly hydro clip-MACRO  METASTATIC MAMMARY CARCINOMA   CESAREAN SECTION  2005/2008   DILATION AND CURETTAGE OF UTERUS  2003   PORTACATH PLACEMENT Left 11/09/2021   Procedure: INSERTION PORT-A-CATH;  Surgeon: Robert Bellow, MD;  Location: [facility] ORS;  Service: General;  Laterality: Left;   SKIN BIOPSY Right 11/09/2021   Procedure: PUNCH BIOPSY SKIN, TATTOO LYMPH NODE RIGHT AXILLA;  Surgeon: Robert Bellow, MD;  Location: [facility] ORS;  Service: General;  Laterality: Right;    SOCIAL HISTORY: Social History   Socioeconomic History   Marital status: Married    Spouse name: Jaci Standard   Number of children: 2   Years of education: Not on file   Highest education level: Not on file  Occupational History   Occupation: and Optometrist  Tobacco Use   Smoking status: Never   Smokeless tobacco: Never  Vaping Use   Vaping Use: Never used  Substance and Sexual Activity   Alcohol use: Never   Drug use: Never   Sexual activity: Yes    Birth control/protection: Other-see comments, Surgical    Comment: vasectomy  Other Topics Concern   Not on file  Social History Narrative   Pre-school teacher; in [place]; 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    Social Determinants of Health   Financial Resource Strain: Not on file  Food Insecurity: Not on file  Transportation Needs: Not on file  Physical Activity: Inactive (09/26/2017)   Exercise Vital Sign    Days of Exercise per Week: 0 days    Minutes of Exercise per Session: 0 min  Stress: Not on file  Social Connections: Not on file  Intimate Partner Violence: Not on file    FAMILY HISTORY: Family History  Problem Relation Age of Onset   Ovarian cysts Mother    Migraines Mother    Melanoma Mother        on leg   Hyperlipidemia Father    Diabetes Maternal Aunt    Stomach cancer Paternal Aunt    Diabetes Maternal Grandfather    Lymphoma Cousin 17   Breast cancer Neg Hx     ALLERGIES:  is allergic to metformin.  MEDICATIONS:  Current Outpatient Medications   Medication Sig Dispense Refill   acetaminophen (TYLENOL) 325 MG tablet Take 2 tablets (650 mg total) by mouth every 6 (six) hours as needed for mild pain (or Fever >/= 101).     alum & mag hydroxide-simeth (MAALOX/MYLANTA)  200-200-20 MG/5ML suspension Take 30 mLs by mouth every 4 (four) hours as needed for indigestion. 355 mL 0   ascorbic acid (VITAMIN C) 500 MG tablet Take 1 tablet (500 mg total) by mouth daily. 30 tablet    calcium-vitamin D (OSCAL WITH D) 500-5 MG-MCG tablet Take 1 tablet by mouth 2 (two) times daily. 60 tablet 2   erythromycin ophthalmic ointment Place 1 Application into both eyes 2 (two) times daily. 3.5 g 0   ferrous sulfate 325 (65 FE) MG tablet Take 325 mg by mouth 2 (two) times daily with a meal.     lidocaine-prilocaine (EMLA) cream Apply on the port. 30 -45 min  prior to port access. 30 g 3   magic mouthwash w/lidocaine SOLN Take 5 mLs by mouth 4 (four) times daily as needed for mouth pain. Sig: Swish/Swallow 5-10 ml four times a day as needed. Dispense 480 ml. 1RF 480 mL 1   Multiple Vitamin (MULTIVITAMIN WITH MINERALS) TABS tablet Take 1 tablet by mouth daily.     norethindrone (MICRONOR) 0.35 MG tablet Take 1 tablet (0.35 mg total) by mouth daily. 84 tablet 2   ONETOUCH VERIO test strip SMARTSIG:1 Each Via Meter Daily PRN     phenol (CHLORASEPTIC) 1.4 % LIQD Use as directed 1 spray in the mouth or throat as needed for throat irritation / pain.  0   prochlorperazine (COMPAZINE) 10 MG tablet Take 1 tablet (10 mg total) by mouth every 6 (six) hours as needed for nausea or vomiting. 40 tablet 1   Semaglutide, 1 MG/DOSE, 4 MG/3ML SOPN Inject into the skin. Inject 1 mg (0.75 ml) subcutaneously once a week.     zinc sulfate 220 (50 Zn) MG capsule Take 1 capsule (220 mg total) by mouth daily.     chlorpheniramine-HYDROcodone 10-8 MG/5ML Take 5 mLs by mouth every 12 (twelve) hours as needed for cough. 115 mL 0   dexamethasone (DECADRON) 4 MG tablet One pill twice a day -  Start the day prior to chemo; do not take the day of chemo. And take for 2 more days after the chemo. 40 tablet 0   feeding supplement (ENSURE ENLIVE / ENSURE PLUS) LIQD Take 237 mLs by mouth 3 (three) times daily between meals. 237 mL 12   guaiFENesin (MUCINEX) 600 MG 12 hr tablet Take 2 tablets (1,200 mg total) by mouth 2 (two) times daily.     Ipratropium-Albuterol (COMBIVENT) 20-100 MCG/ACT AERS respimat Inhale 1 puff into the lungs every 6 (six) hours. 4 g 0   loperamide (IMODIUM) 2 MG capsule Take 1 capsule (2 mg total) by mouth as needed for diarrhea or loose stools. 30 capsule 0   No current facility-administered medications for this visit.   Facility-Administered Medications Ordered in Other Visits  Medication Dose Route Frequency Provider Last Rate Last Admin   CARBOplatin (PARAPLATIN) 900 mg in sodium chloride 0.9 % 500 mL chemo infusion  900 mg Intravenous Once Cammie Sickle, MD       DOCEtaxel (TAXOTERE) 160 mg in sodium chloride 0.9 % 250 mL chemo infusion  75 mg/m2 (Treatment Plan Recorded) Intravenous Once Charlaine Dalton R, MD       heparin lock flush 100 unit/mL  500 Units Intracatheter Once PRN Cammie Sickle, MD       pertuzumab (PERJETA) 420 mg in sodium chloride 0.9 % 250 mL chemo infusion  420 mg Intravenous Once Cammie Sickle, MD       trastuzumab-dkst ([place]) [phone]  mg in sodium chloride 0.9 % 250 mL chemo infusion  6 mg/kg (Treatment Plan Recorded) Intravenous Once Cammie Sickle, MD          .  PHYSICAL EXAMINATION: ECOG PERFORMANCE STATUS: 0 - Asymptomatic  Vitals:   02/02/22 0825 02/02/22 0842  BP: (!) 75/53 (!) 80/54  Pulse: 88   Temp: 98.1 F (36.7 C)   SpO2: 99%      Filed Weights   02/02/22 0825  Weight: 221 lb 6.4 oz (100.4 kg)    Right breast-10:00 vague 3 to 4 cm mass noted-with skin thickening/ ?  Biopsy changes.  No nipple changes no erythema.   Physical Exam Vitals and nursing note reviewed.  HENT:      Head: Normocephalic and atraumatic.     Mouth/Throat:     Pharynx: Oropharynx is clear.  Eyes:     Extraocular Movements: Extraocular movements intact.     Pupils: Pupils are equal, round, and reactive to light.  Cardiovascular:     Rate and Rhythm: Normal rate and regular rhythm.  Pulmonary:     Comments: Decreased breath sounds bilaterally.  Abdominal:     Palpations: Abdomen is soft.  Musculoskeletal:        General: Normal range of motion.     Cervical back: Normal range of motion.  Skin:    General: Skin is warm.  Neurological:     General: No focal deficit present.     Mental Status: She is alert and oriented to person, place, and time.  Psychiatric:        Behavior: Behavior normal.        Judgment: Judgment normal.      LABORATORY DATA:  I have reviewed the data as listed Lab Results  Component Value Date   WBC 6.7 02/02/2022   HGB 9.9 (L) 02/02/2022   HCT 30.5 (L) 02/02/2022   MCV 98.4 02/02/2022   PLT 128 (L) 02/02/2022   Recent Labs    12/27/21 2105 12/28/21 0329 12/30/21 0501 01/01/22 1012 01/12/22 0823 01/22/22 0935 02/02/22 0811  NA 134*   < > 140   < > 139 138 137  K 3.6   < > 3.2*   < > 3.9 3.8 3.6  CL 104   < > 107   < > 107 102 108  CO2 22   < > 25   < > 25 27 25   GLUCOSE 192*   < > 92   < > 203* 183* 175*  BUN 16   < > 8   < > 10 10 13   CREATININE 0.93   < > 0.64   < > 0.80 1.03* 0.59  CALCIUM 8.6*   < > 8.3*   < > 9.5 9.1 8.7*  GFRNONAA >60   < > >60   < > >60 >60 >60  PROT 7.0   < > 5.9*  --  6.6  --  5.9*  ALBUMIN 3.7   < > 3.0*  --  3.4*  --  3.1*  AST 42*   < > 30  --  30  --  28  ALT 51*   < > 35  --  28  --  27  ALKPHOS 70   < > 49  --  53  --  45  BILITOT 1.1   < > 0.3  --  0.4  --  0.5  BILIDIR 0.2  --   --   --   --   --   --  IBILI 0.9  --   --   --   --   --   --    < > = values in this interval not displayed.    RADIOGRAPHIC STUDIES: I have personally reviewed the radiological images as listed and agreed with the  findings in the report. MM DIAG BREAST TOMO UNI RIGHT  Result Date: 01/07/2022 CLINICAL DATA:  History of biopsy-proven right breast invasive ductal carcinoma and positive node. Status post 3 rounds chemotherapy, presenting for follow-up imaging to assess treatment response. EXAM: DIGITAL DIAGNOSTIC UNILATERAL RIGHT MAMMOGRAM WITH TOMOSYNTHESIS; ULTRASOUND RIGHT BREAST LIMITED TECHNIQUE: Right digital diagnostic mammography and breast tomosynthesis was performed.; Targeted ultrasound examination of the right breast was performed COMPARISON:  Previous exam(s). ACR Breast Density Category c: The breast tissue is heterogeneously dense, which may obscure small masses. FINDINGS: Mammogram: Redemonstrated mass with associated architectural distortion in the right upper outer breast, with an associated ribbon shaped biopsy marking clip, corresponding to biopsy-proven malignancy. Notably, accurate size measurement of the mass on mammogram is slightly limited given the irregular margin and surrounding dense breast tissue. Persistent right breast skin thickening. Decreased right axillary lymphadenopathy. Ultrasound: Targeted right breast ultrasound redemonstrates an irregular hypoechoic mass in the right breast 10 o'clock position 6 cm from the nipple. The mass measures up to 2.0 x 1.1 by 1.3 cm, previously measuring 2.9 x 1.9 x 2.0 cm in April 2023. Incidentally visualized benign simple cyst in the right breast 9:30 position measuring up to 3.4 x 1.3 x 3.3 cm. Targeted right axillary ultrasound demonstrates decreased right axillary lymphadenopathy, with abnormal cortical thickening up to 3 mm, previously 8 mm. IMPRESSION: 1. Slightly decreased size of the palpable biopsy-proven malignancy in the right breast 10 o'clock position, measuring up to 2.0 cm and previously measuring up to 2.9 cm. 2.  Persistent right breast skin thickening. 3.  Decreased right axillary lymphadenopathy. RECOMMENDATION: Recommend continued surgical  and oncologic follow up. I have discussed the findings and recommendations with the patient. If applicable, a reminder letter will be sent to the patient regarding the next appointment. BI-RADS CATEGORY  6: Known biopsy-proven malignancy. Electronically Signed   By: Beryle Flock M.D.   On: 01/07/2022 11:00  US Breast Limited Uni Right Inc Axilla  Result Date: 01/07/2022 CLINICAL DATA:  History of biopsy-proven right breast invasive ductal carcinoma and positive node. Status post 3 rounds chemotherapy, presenting for follow-up imaging to assess treatment response. EXAM: DIGITAL DIAGNOSTIC UNILATERAL RIGHT MAMMOGRAM WITH TOMOSYNTHESIS; ULTRASOUND RIGHT BREAST LIMITED TECHNIQUE: Right digital diagnostic mammography and breast tomosynthesis was performed.; Targeted ultrasound examination of the right breast was performed COMPARISON:  Previous exam(s). ACR Breast Density Category c: The breast tissue is heterogeneously dense, which may obscure small masses. FINDINGS: Mammogram: Redemonstrated mass with associated architectural distortion in the right upper outer breast, with an associated ribbon shaped biopsy marking clip, corresponding to biopsy-proven malignancy. Notably, accurate size measurement of the mass on mammogram is slightly limited given the irregular margin and surrounding dense breast tissue. Persistent right breast skin thickening. Decreased right axillary lymphadenopathy. Ultrasound: Targeted right breast ultrasound redemonstrates an irregular hypoechoic mass in the right breast 10 o'clock position 6 cm from the nipple. The mass measures up to 2.0 x 1.1 by 1.3 cm, previously measuring 2.9 x 1.9 x 2.0 cm in April 2023. Incidentally visualized benign simple cyst in the right breast 9:30 position measuring up to 3.4 x 1.3 x 3.3 cm. Targeted right axillary ultrasound demonstrates decreased right axillary lymphadenopathy, with abnormal  cortical thickening up to 3 mm, previously 8 mm. IMPRESSION: 1.  Slightly decreased size of the palpable biopsy-proven malignancy in the right breast 10 o'clock position, measuring up to 2.0 cm and previously measuring up to 2.9 cm. 2.  Persistent right breast skin thickening. 3.  Decreased right axillary lymphadenopathy. RECOMMENDATION: Recommend continued surgical and oncologic follow up. I have discussed the findings and recommendations with the patient. If applicable, a reminder letter will be sent to the patient regarding the next appointment. BI-RADS CATEGORY  6: Known biopsy-proven malignancy. Electronically Signed   By: Beryle Flock M.D.   On: 01/07/2022 11:00   ASSESSMENT & PLAN:   Carcinoma of upper-outer quadrant of right breast in female, estrogen receptor positive ([place]) # MAY 2023- STAGE III-T4N1 ER/PR positive HER2 POSITIVE right breast cancer;LN-positive-ER/PR positive however 2 NEGATIVE s/p biopsy [see discussion below].  Discussed with Dr. Sedalia Muta tumor heterogeneity. BREAST MRI- JUNE 2023- The patient's known malignancy in the upper outer quadrant of the right breast measures 3.8 x 2.8 x 3.6 cm. Numerous other suspicious masses are identified in the right breast as above involving the upper outer and upper inner quadrants, located both anterior and posterior to the known malignancy.  Numerous enhancing foci in the skin as above are worrisome for skin metastases; Two mildly prominent right internal mammary nodes were not FDG avid on recent PET-CT imaging.    # Currently on neoadjuvant chemotherapy- TCH plus P x3   MUGA scan May 31st, 2023- -61%. 3rd, AUG 2023-mammogram/ultrasound -Slightly decreased size of the palpable biopsy-proven malignancy in the right breast 10 o'clock position, measuring up to 2.0 cm and previously measuring up to 2.9 cm;  Persistent right breast skin thickening;  Decreased right axillary lymphadenopathy.  Updated Dr. Bary Castilla.  # Proceed with cycle #5   d-2udenyca.  Discussed regarding use of Claritin post growth factor.  Labs today reviewed;  acceptable for treatment today.  We will plan to proceed with repeat CT imaging post therapy.  # Hypotension: Systolic 62E; unclear etiology- HR- 80s.  Asymptomatic.  Continue increased fluid intake.  #Weight gain/fluid retention-from underlying chemotherapy Taxotere; steroids.  Recommend taking dexamethasone 4 mg twice a day  postchemotherapy.  Patient currently getting none after chemotherapy.  # PN G-1-2 sec to Taxotere- monitor for now. STABLE.   # DISPOSITION: # proceed with Chemo  Today; D-2 neuslata as planned.  # Follow up in 10 days- NP- labs- cbc/bmp; possible 1 lit over 1 hour  # follow up in 3 weeks-MD: labs- cbc/cmp;  Taxotere+ Caboplatin+herceptin+Perjeta; D-2 neulasta; Dr.B    All questions were answered. The patient/family knows to call the clinic with any problems, questions or concerns.       Cammie Sickle, MD 02/02/2022 10:43 AM

## 2022-02-02 NOTE — Progress Notes (Signed)
C/o b/l leg pain and swelling over the weekend.  Pt is constipated, she states it hurts, wandering if she has a hemorrhoid.  C/o fingers/nails hurt.

## 2022-02-03 ENCOUNTER — Other Ambulatory Visit: Payer: Self-pay

## 2022-02-03 ENCOUNTER — Inpatient Hospital Stay: Payer: 59

## 2022-02-03 DIAGNOSIS — Z5112 Encounter for antineoplastic immunotherapy: Secondary | ICD-10-CM | POA: Diagnosis not present

## 2022-02-03 DIAGNOSIS — C50411 Malignant neoplasm of upper-outer quadrant of right female breast: Secondary | ICD-10-CM

## 2022-02-03 MED ORDER — PEGFILGRASTIM INJECTION 6 MG/0.6ML ~~LOC~~
6.0000 mg | PREFILLED_SYRINGE | Freq: Once | SUBCUTANEOUS | Status: AC
  Administered 2022-02-03: 6 mg via SUBCUTANEOUS

## 2022-02-05 ENCOUNTER — Other Ambulatory Visit: Payer: Self-pay

## 2022-02-10 ENCOUNTER — Other Ambulatory Visit: Payer: Self-pay | Admitting: *Deleted

## 2022-02-10 DIAGNOSIS — E860 Dehydration: Secondary | ICD-10-CM

## 2022-02-10 DIAGNOSIS — Z170 Estrogen receptor positive status [ER+]: Secondary | ICD-10-CM

## 2022-02-12 ENCOUNTER — Inpatient Hospital Stay (HOSPITAL_BASED_OUTPATIENT_CLINIC_OR_DEPARTMENT_OTHER): Payer: 59 | Admitting: Medical Oncology

## 2022-02-12 ENCOUNTER — Encounter: Payer: Self-pay | Admitting: Medical Oncology

## 2022-02-12 ENCOUNTER — Other Ambulatory Visit: Payer: Self-pay

## 2022-02-12 ENCOUNTER — Inpatient Hospital Stay: Payer: 59 | Attending: Internal Medicine

## 2022-02-12 ENCOUNTER — Inpatient Hospital Stay: Payer: 59

## 2022-02-12 VITALS — BP 103/70 | HR 111 | Temp 99.0°F | Resp 18 | Ht 65.0 in | Wt 221.0 lb

## 2022-02-12 DIAGNOSIS — Z170 Estrogen receptor positive status [ER+]: Secondary | ICD-10-CM | POA: Insufficient documentation

## 2022-02-12 DIAGNOSIS — L03011 Cellulitis of right finger: Secondary | ICD-10-CM | POA: Diagnosis not present

## 2022-02-12 DIAGNOSIS — Z5111 Encounter for antineoplastic chemotherapy: Secondary | ICD-10-CM | POA: Insufficient documentation

## 2022-02-12 DIAGNOSIS — E860 Dehydration: Secondary | ICD-10-CM | POA: Diagnosis not present

## 2022-02-12 DIAGNOSIS — E876 Hypokalemia: Secondary | ICD-10-CM | POA: Insufficient documentation

## 2022-02-12 DIAGNOSIS — C773 Secondary and unspecified malignant neoplasm of axilla and upper limb lymph nodes: Secondary | ICD-10-CM | POA: Insufficient documentation

## 2022-02-12 DIAGNOSIS — C50411 Malignant neoplasm of upper-outer quadrant of right female breast: Secondary | ICD-10-CM | POA: Insufficient documentation

## 2022-02-12 DIAGNOSIS — Z5189 Encounter for other specified aftercare: Secondary | ICD-10-CM | POA: Diagnosis not present

## 2022-02-12 DIAGNOSIS — Z5112 Encounter for antineoplastic immunotherapy: Secondary | ICD-10-CM | POA: Diagnosis present

## 2022-02-12 DIAGNOSIS — I959 Hypotension, unspecified: Secondary | ICD-10-CM | POA: Insufficient documentation

## 2022-02-12 DIAGNOSIS — Z95828 Presence of other vascular implants and grafts: Secondary | ICD-10-CM

## 2022-02-12 LAB — CBC WITH DIFFERENTIAL/PLATELET
Abs Immature Granulocytes: 0.05 10*3/uL (ref 0.00–0.07)
Basophils Absolute: 0 10*3/uL (ref 0.0–0.1)
Basophils Relative: 1 %
Eosinophils Absolute: 0 10*3/uL (ref 0.0–0.5)
Eosinophils Relative: 0 %
HCT: 29.6 % — ABNORMAL LOW (ref 36.0–46.0)
Hemoglobin: 9.6 g/dL — ABNORMAL LOW (ref 12.0–15.0)
Immature Granulocytes: 1 %
Lymphocytes Relative: 24 %
Lymphs Abs: 1.2 10*3/uL (ref 0.7–4.0)
MCH: 32.3 pg (ref 26.0–34.0)
MCHC: 32.4 g/dL (ref 30.0–36.0)
MCV: 99.7 fL (ref 80.0–100.0)
Monocytes Absolute: 0.6 10*3/uL (ref 0.1–1.0)
Monocytes Relative: 11 %
Neutro Abs: 3.1 10*3/uL (ref 1.7–7.7)
Neutrophils Relative %: 63 %
Platelets: 84 10*3/uL — ABNORMAL LOW (ref 150–400)
RBC: 2.97 MIL/uL — ABNORMAL LOW (ref 3.87–5.11)
RDW: 18.5 % — ABNORMAL HIGH (ref 11.5–15.5)
Smear Review: NORMAL
WBC: 5 10*3/uL (ref 4.0–10.5)
nRBC: 0.4 % — ABNORMAL HIGH (ref 0.0–0.2)

## 2022-02-12 LAB — BASIC METABOLIC PANEL
Anion gap: 7 (ref 5–15)
BUN: 8 mg/dL (ref 6–20)
CO2: 26 mmol/L (ref 22–32)
Calcium: 8.4 mg/dL — ABNORMAL LOW (ref 8.9–10.3)
Chloride: 105 mmol/L (ref 98–111)
Creatinine, Ser: 0.83 mg/dL (ref 0.44–1.00)
GFR, Estimated: >60 mL/min (ref >60)
Glucose, Bld: 160 mg/dL — ABNORMAL HIGH (ref 70–99)
Potassium: 3 mmol/L — ABNORMAL LOW (ref 3.5–5.1)
Sodium: 138 mmol/L (ref 135–145)

## 2022-02-12 MED ORDER — HEPARIN SOD (PORK) LOCK FLUSH 100 UNIT/ML IV SOLN
500.0000 [IU] | Freq: Once | INTRAVENOUS | Status: AC
  Administered 2022-02-12: 500 [IU]
  Filled 2022-02-12: qty 5

## 2022-02-12 MED ORDER — POTASSIUM CHLORIDE 20 MEQ/100ML IV SOLN
20.0000 meq | Freq: Once | INTRAVENOUS | Status: AC
  Administered 2022-02-12: 20 meq via INTRAVENOUS

## 2022-02-12 MED ORDER — SODIUM CHLORIDE 0.9 % IV SOLN
INTRAVENOUS | Status: DC
  Filled 2022-02-12 (×3): qty 250

## 2022-02-12 MED ORDER — SODIUM CHLORIDE 0.9% FLUSH
10.0000 mL | Freq: Once | INTRAVENOUS | Status: AC
  Administered 2022-02-12: 10 mL via INTRAVENOUS
  Filled 2022-02-12: qty 10

## 2022-02-12 NOTE — Progress Notes (Signed)
[facility] NOTE  Patient Care Team: Latanya Maudlin, NP as PCP - General (Family Medicine) Daiva Huge, RN as Oncology Nurse Navigator Cammie Sickle, [name]  CHIEF COMPLAINTS/PURPOSE OF CONSULTATION: Breast cancer  Oncology History Overview Note  MAY 2023-    Targeted ultrasound is performed, showing a 2.9 x 1.9 x 2.0 cm irregular hypoechoic mass right breast 10 o'clock position 6 cm from nipple at the site of palpable concern.   There is an abnormal 8 mm thickened right axillary lymph node.   There is skin thickening overlying the right breast. ------------------  A. BREAST, RIGHT AT 10:00, 6 CM FROM THE NIPPLE; ULTRASOUND-GUIDED CORE  NEEDLE BIOPSY:  - INVASIVE MAMMARY CARCINOMA, NO SPECIAL TYPE.   Size of invasive carcinoma: 11 mm in this sample  Histologic grade of invasive carcinoma: Grade 2                       Glandular/tubular differentiation score: 3                       Nuclear pleomorphism score: 2                       Mitotic rate score: 1                       Total score: 6  Ductal carcinoma in situ: Present, intermediate grade  Lymphovascular invasion: Not identified   B. LYMPH NODE, RIGHT AXILLARY; ULTRASOUND-GUIDED CORE NEEDLE BIOPSY:  - 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 IN GREATEST  EXTENT.   Comment:  The malignancy in the primary breast lesion appears morphologically  different from tumor in the lymph node sampling, with tumor present in  lymph node more suggestive of a histologic grade 3, with significantly  increased mitotic activity and pleomorphism. Due to the discrepancy in  these morphologic patterns, ER/PR/HER2 immunohistochemistry will be  performed on both blocks A1 and B1, with reflex to [place] for HER2 2+. The  results will be reported in an addendum.   ADDENDUM:  CASE SUMMARY: BREAST BIOMARKER TESTS - A - BREAST, RIGHT AT 10:00  Estrogen Receptor (ER)  Status: POSITIVE          Percentage of cells with nuclear positivity: 71-80%          Average intensity of staining: Strong   Progesterone Receptor (PgR) Status: POSITIVE          Percentage of cells with nuclear positivity: 81-90%          Average intensity of staining: Strong   HER2 (by immunohistochemistry): POSITIVE (Score 3+)       Percentage of cells with uniform intense complete membrane  staining: 61-70%  HER2 displays a heterogeneous staining pattern, with areas showing a  strong 3+ staining pattern, and other regions with complete absence (0+)  of staining.   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  Discussed with Dr. Sedalia Muta tumor heterogeneity. BREAST MRI- JUNE 2023- The patient's known malignancy in the upper outer quadrant of the right breast measures 3.8 x 2.8 x 3.6 cm. Numerous other suspicious masses are identified in the right breast as above involving the upper outer and upper inner quadrants, located both anterior and posterior to the known malignancy.  Numerous enhancing foci in the skin as above are worrisome for skin metastases ; Bx proven. Biopsy proven metastatic node in the right axilla.  No evidence of malignancy in the left breast;  Two mildly prominent right internal mammary nodes were not FDG avid on recent PET-CT imaging.  Currently on neoadjuvant chemotherapy- TCH plus  P x6 cycles;  MUGA scan May 31st, 2023- -61%.    # June 7th, 2023- NEO-ADJUVANT CHEMO- TCH+P   # Genetic counseling s/p- NEG for any deleterious mutations.     Carcinoma of upper-outer quadrant of right breast in female, estrogen receptor positive ([place])  10/26/2021 Initial Diagnosis   Carcinoma of upper-outer quadrant of right breast in female, estrogen receptor positive ([place])   10/26/2021 Cancer Staging   Staging form: Breast, AJCC 8th Edition - Clinical: Stage IB (cT2, cN1, cM0, G2, ER+, PR+, HER2+) - Signed by Cammie Sickle, MD on 10/26/2021 Histologic grading system: 3 grade system   11/10/2021 -  Chemotherapy   Patient is on Treatment Plan : BREAST  Docetaxel + Carboplatin + Trastuzumab + Pertuzumab  (TCHP) q21d      11/11/2021 - 01/13/2022 Chemotherapy   Patient is on Treatment Plan : BREAST  Docetaxel + Carboplatin + Trastuzumab + Pertuzumab  (TCHP) q21d       Genetic Testing   Negative genetic testing. No pathogenic variants identified on the [facility] CancerNext-Expanded+RNA panel. The report date is 11/29/2021.  The CancerNext-Expanded + RNAinsight gene panel offered by [employer] and includes sequencing and rearrangement analysis for the following 77 genes: IP, ALK, APC*, ATM*, AXIN2, BAP1, BARD1, BLM, BMPR1A, BRCA1*, BRCA2*, BRIP1*, CDC73, CDH1*,CDK4, CDKN1B, CDKN2A, CHEK2*, CTNNA1, DICER1, FANCC, FH, FLCN, GALNT12, KIF1B, LZTR1, MAX, MEN1, MET, MLH1*, MSH2*, MSH3, MSH6*, MUTYH*, NBN, NF1*, NF2, NTHL1, PALB2*, PHOX2B, PMS2*, POT1, PRKAR1A, PTCH1, PTEN*, RAD51C*, RAD51D*,RB1, RECQL, RET, SDHA, SDHAF2, SDHB, SDHC, SDHD, SMAD4, SMARCA4, SMARCB1, SMARCE1, STK11, SUFU, TMEM127, TP53*,TSC1, TSC2, VHL and XRCC2 (sequencing and deletion/duplication); EGFR, EGLN1, HOXB13, KIT, MITF, PDGFRA, POLD1 and POLE (sequencing only); EPCAM and GREM1 (deletion/duplication only).     HISTORY OF PRESENTING ILLNESS: Patient is ambulating independently.  Accompanied by her husband.   Marilyn Page  46 y.o.  female right breast TRIPLE POSITIVE with T4- Stage III currently on neoadjuvant TCH plus P chemotherapy is here for follow-up.  Patient currently status post neoadjuvant TCH plus P cycle #5. Today she reports that she is doing well. Her constipation has resolved. She denies fevers, peripheral edema, rash. She does state that food and water does not taste good so she has been working to keep up with recommended intake. Weight is shown below. At her last visit prednisone was added to the first 2 days following treatment- she is taking this as directed and noticed improvement in her body aches secondary to the Udenyca.Taking the claritin post growth factor as directed. Continues to have fatigue but this has not  worsened. Continues to have mild numbness and tingling in hands/fingers.  Wt Readings from Last 3 Encounters:  02/12/22 221 lb (100.2 kg)  02/02/22 221 lb 6.4 oz (100.4 kg)  01/12/22 214 lb 12.8 oz (97.4 kg)    Review of Systems  Constitutional:  Negative for chills, diaphoresis, fever, malaise/fatigue and weight loss.  HENT:  Negative for nosebleeds and sore throat.   Eyes:  Negative for double vision.  Respiratory:  Negative for cough, hemoptysis, sputum production, shortness of breath and wheezing.   Cardiovascular:  Negative for chest pain, palpitations, orthopnea and leg swelling.  Gastrointestinal:  Negative for abdominal pain, blood in stool, constipation, diarrhea, heartburn, melena, nausea and vomiting.  Genitourinary:  Negative for dysuria, frequency and urgency.  Musculoskeletal:  Negative for back pain and joint pain.  Skin: Negative.  Negative for itching and rash.  Neurological:  Negative for dizziness, tingling, focal weakness, weakness and headaches.  Endo/Heme/Allergies:  Does not bruise/bleed easily.  Psychiatric/Behavioral:  Negative for depression. The patient is not nervous/anxious and does not have insomnia.      MEDICAL HISTORY:  Past Medical History:   Diagnosis Date   Carcinoma of upper-outer quadrant of right breast in female, estrogen receptor positive ([place]) 10/26/2021   Diabetes mellitus without complication ([place])    History of gestational diabetes    Personal history of chemotherapy     SURGICAL HISTORY: Past Surgical History:  Procedure Laterality Date   BREAST BIOPSY Right 10/19/2021   [place] Core bx 10:00 6 cmfn Ribbon Clip-INVASIVE MAMMARY CARCINOMA,. Axilla Butterfly hydro clip-MACRO METASTATIC MAMMARY CARCINOMA   CESAREAN SECTION  2005/2008   DILATION AND CURETTAGE OF UTERUS  2003   PORTACATH PLACEMENT Left 11/09/2021   Procedure: INSERTION PORT-A-CATH;  Surgeon: Robert Bellow, MD;  Location: [facility] ORS;  Service: General;  Laterality: Left;   SKIN BIOPSY Right 11/09/2021   Procedure: PUNCH BIOPSY SKIN, TATTOO LYMPH NODE RIGHT AXILLA;  Surgeon: Robert Bellow, MD;  Location: [facility] ORS;  Service: General;  Laterality: Right;    SOCIAL HISTORY: Social History   Socioeconomic History   Marital status: Married    Spouse name: Jaci Standard   Number of children: 2   Years of education: Not on file   Highest education level: Not on file  Occupational History   Occupation: and Optometrist  Tobacco Use   Smoking status: Never   Smokeless tobacco: Never  Vaping Use   Vaping Use: Never used  Substance and Sexual Activity   Alcohol use: Never   Drug use: Never   Sexual activity: Yes    Birth control/protection: Other-see comments, Surgical    Comment: vasectomy  Other Topics Concern   Not on file  Social History Narrative   Pre-school teacher; in [place]; 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    Social Determinants of Health   Financial Resource Strain: Not on file  Food Insecurity: Not on file  Transportation Needs: Not on file  Physical Activity: Inactive (09/26/2017)   Exercise Vital Sign    Days of Exercise per Week: 0 days    Minutes of Exercise per Session: 0 min  Stress: Not on file  Social Connections: Not on  file  Intimate Partner Violence: Not on file    FAMILY HISTORY: Family History  Problem Relation Age of Onset   Ovarian cysts Mother    Migraines Mother    Melanoma Mother        on leg   Hyperlipidemia Father    Diabetes Maternal Aunt  Stomach cancer Paternal Aunt    Diabetes Maternal Grandfather    Lymphoma Cousin 17   Breast cancer Neg Hx     ALLERGIES:  is allergic to metformin.  MEDICATIONS:  Current Outpatient Medications  Medication Sig Dispense Refill   acetaminophen (TYLENOL) 325 MG tablet Take 2 tablets (650 mg total) by mouth every 6 (six) hours as needed for mild pain (or Fever >/= 101).     alum & mag hydroxide-simeth (MAALOX/MYLANTA) 200-200-20 MG/5ML suspension Take 30 mLs by mouth every 4 (four) hours as needed for indigestion. 355 mL 0   ascorbic acid (VITAMIN C) 500 MG tablet Take 1 tablet (500 mg total) by mouth daily. 30 tablet    calcium-vitamin D (OSCAL WITH D) 500-5 MG-MCG tablet Take 1 tablet by mouth 2 (two) times daily. 60 tablet 2   chlorpheniramine-HYDROcodone 10-8 MG/5ML Take 5 mLs by mouth every 12 (twelve) hours as needed for cough. 115 mL 0   erythromycin ophthalmic ointment Place 1 Application into both eyes 2 (two) times daily. 3.5 g 0   feeding supplement (ENSURE ENLIVE / ENSURE PLUS) LIQD Take 237 mLs by mouth 3 (three) times daily between meals. 237 mL 12   ferrous sulfate 325 (65 FE) MG tablet Take 325 mg by mouth 2 (two) times daily with a meal.     guaiFENesin (MUCINEX) 600 MG 12 hr tablet Take 2 tablets (1,200 mg total) by mouth 2 (two) times daily.     Ipratropium-Albuterol (COMBIVENT) 20-100 MCG/ACT AERS respimat Inhale 1 puff into the lungs every 6 (six) hours. 4 g 0   lidocaine-prilocaine (EMLA) cream Apply on the port. 30 -45 min  prior to port access. 30 g 3   loperamide (IMODIUM) 2 MG capsule Take 1 capsule (2 mg total) by mouth as needed for diarrhea or loose stools. 30 capsule 0   magic mouthwash w/lidocaine SOLN Take 5 mLs by  mouth 4 (four) times daily as needed for mouth pain. Sig: Swish/Swallow 5-10 ml four times a day as needed. Dispense 480 ml. 1RF 480 mL 1   Multiple Vitamin (MULTIVITAMIN WITH MINERALS) TABS tablet Take 1 tablet by mouth daily.     norethindrone (MICRONOR) 0.35 MG tablet Take 1 tablet (0.35 mg total) by mouth daily. 84 tablet 2   ONETOUCH VERIO test strip SMARTSIG:1 Each Via Meter Daily PRN     phenol (CHLORASEPTIC) 1.4 % LIQD Use as directed 1 spray in the mouth or throat as needed for throat irritation / pain.  0   prochlorperazine (COMPAZINE) 10 MG tablet Take 1 tablet (10 mg total) by mouth every 6 (six) hours as needed for nausea or vomiting. 40 tablet 1   Semaglutide, 1 MG/DOSE, 4 MG/3ML SOPN Inject into the skin. Inject 1 mg (0.75 ml) subcutaneously once a week.     zinc sulfate 220 (50 Zn) MG capsule Take 1 capsule (220 mg total) by mouth daily.     dexamethasone (DECADRON) 4 MG tablet One pill twice a day - Start the day prior to chemo; do not take the day of chemo. And take for 2 more days after the chemo. (Patient not taking: Reported on 02/12/2022) 40 tablet 0   No current facility-administered medications for this visit.   Facility-Administered Medications Ordered in Other Visits  Medication Dose Route Frequency Provider Last Rate Last Admin   0.9 %  sodium chloride infusion   Intravenous Continuous Hughie Closs, [place] 999 mL/hr at 02/12/22 0959 New Bag at 02/12/22 [phone]  potassium chloride 20 mEq in 100 mL IVPB  20 mEq Intravenous Once Kenyon Eshleman M, PA-C          .  PHYSICAL EXAMINATION: ECOG PERFORMANCE STATUS: 0 - Asymptomatic  Vitals:   02/12/22 0845  BP: 103/70  Pulse: (!) 111  Resp: 18  Temp: 99 F (37.2 C)     Filed Weights   02/12/22 0845  Weight: 221 lb (100.2 kg)    Right breast-10:00 vague 3 to 4 cm mass noted-with skin thickening/ ?  Biopsy changes.  No nipple changes no erythema.   Physical Exam Vitals and nursing note reviewed.  HENT:      Head: Normocephalic and atraumatic.     Mouth/Throat:     Pharynx: Oropharynx is clear.  Eyes:     Extraocular Movements: Extraocular movements intact.     Pupils: Pupils are equal, round, and reactive to light.  Cardiovascular:     Rate and Rhythm: Normal rate and regular rhythm.  Pulmonary:     Comments: Decreased breath sounds bilaterally.  Abdominal:     Palpations: Abdomen is soft.  Musculoskeletal:        General: Normal range of motion.     Cervical back: Normal range of motion.  Skin:    General: Skin is warm.  Neurological:     General: No focal deficit present.     Mental Status: She is alert and oriented to person, place, and time.  Psychiatric:        Behavior: Behavior normal.        Judgment: Judgment normal.      LABORATORY DATA:  I have reviewed the data as listed Lab Results  Component Value Date   WBC 5.0 02/12/2022   HGB 9.6 (L) 02/12/2022   HCT 29.6 (L) 02/12/2022   MCV 99.7 02/12/2022   PLT 84 (L) 02/12/2022   Recent Labs    12/27/21 2105 12/28/21 0329 12/30/21 0501 01/01/22 1012 01/12/22 0823 01/22/22 0935 02/02/22 0811 02/12/22 0910  NA 134*   < > 140   < > 139 138 137 138  K 3.6   < > 3.2*   < > 3.9 3.8 3.6 3.0*  CL 104   < > 107   < > 107 102 108 105  CO2 22   < > 25   < > 25 27 25 26   GLUCOSE 192*   < > 92   < > 203* 183* 175* 160*  BUN 16   < > 8   < > 10 10 13 8   CREATININE 0.93   < > 0.64   < > 0.80 1.03* 0.59 0.83  CALCIUM 8.6*   < > 8.3*   < > 9.5 9.1 8.7* 8.4*  GFRNONAA >60   < > >60   < > >60 >60 >60 >60  PROT 7.0   < > 5.9*  --  6.6  --  5.9*  --   ALBUMIN 3.7   < > 3.0*  --  3.4*  --  3.1*  --   AST 42*   < > 30  --  30  --  28  --   ALT 51*   < > 35  --  28  --  27  --   ALKPHOS 70   < > 49  --  53  --  45  --   BILITOT 1.1   < > 0.3  --  0.4  --  0.5  --  BILIDIR 0.2  --   --   --   --   --   --   --   IBILI 0.9  --   --   --   --   --   --   --    < > = values in this interval not displayed.     RADIOGRAPHIC  STUDIES: I have personally reviewed the radiological images as listed and agreed with the findings in the report. No results found.  ASSESSMENT & PLAN:  Carcinoma of upper-outer quadrant of right breast in female, estrogen receptor positive ([place]) # MAY 2023- STAGE III-T4N1 ER/PR positive HER2 POSITIVE right breast cancer;LN-positive-ER/PR positive however 2 NEGATIVE s/p biopsy [see discussion below].  Discussed with Dr. Sedalia Muta tumor heterogeneity. BREAST MRI- JUNE 2023- The patient's known malignancy in the upper outer quadrant of the right breast measures 3.8 x 2.8 x 3.6 cm. Numerous other suspicious masses are identified in the right breast as above involving the upper outer and upper inner quadrants, located both anterior and posterior to the known malignancy.  Numerous enhancing foci in the skin as above are worrisome for skin metastases; Two mildly prominent right internal mammary nodes were not FDG avid on recent PET-CT imaging.     # Currently on neoadjuvant chemotherapy- TCH plus P x3   MUGA scan May 31st, 2023- -61%. 3rd, AUG 2023-mammogram/ultrasound -Slightly decreased size of the palpable biopsy-proven malignancy in the right breast 10 o'clock position, measuring up to 2.0 cm and previously measuring up to 2.9 cm;  Persistent right breast skin thickening;  Decreased right axillary lymphadenopathy.     Today:   # Hypotension: Chronic. Improved from previous with additional oral fluid intake. She will continue to work on hydration. 1 L IVF today. Will continue to monitor.    #Weight gain/fluid retention-from underlying chemotherapy Taxotere; steroids. Improved with her starting  dexamethasone 4 mg twice a day  postchemotherapy   # PN G-1-2 sec to Taxotere- STABLE. Continue monitoring   #Hypokalemia: Likely secondary to reduce oral intake of potassium rich foods. 20 MEW supplementation administered via IV today. She will continue potassium rich foods. Recheck at follow up visit.     # DISPOSITION:  1L IVF today, 20 MEQ K Has follow up with Dr. B in about 2 weeks. Will add on magnesium to lab work for this visit. -[place]  All questions were answered. The patient/family knows to call the clinic with any problems, questions or concerns.       Hughie Closs, PA-C 02/12/2022 10:07 AM

## 2022-02-13 ENCOUNTER — Other Ambulatory Visit: Payer: Self-pay

## 2022-02-18 ENCOUNTER — Inpatient Hospital Stay (HOSPITAL_BASED_OUTPATIENT_CLINIC_OR_DEPARTMENT_OTHER): Payer: 59 | Admitting: Medical Oncology

## 2022-02-18 ENCOUNTER — Encounter: Payer: Self-pay | Admitting: Internal Medicine

## 2022-02-18 ENCOUNTER — Encounter: Payer: Self-pay | Admitting: Medical Oncology

## 2022-02-18 VITALS — BP 98/78 | HR 100 | Temp 98.8°F | Ht 65.0 in | Wt 219.8 lb

## 2022-02-18 DIAGNOSIS — L03011 Cellulitis of right finger: Secondary | ICD-10-CM | POA: Diagnosis not present

## 2022-02-18 DIAGNOSIS — Z79899 Other long term (current) drug therapy: Secondary | ICD-10-CM | POA: Diagnosis not present

## 2022-02-18 DIAGNOSIS — C50411 Malignant neoplasm of upper-outer quadrant of right female breast: Secondary | ICD-10-CM

## 2022-02-18 DIAGNOSIS — Z170 Estrogen receptor positive status [ER+]: Secondary | ICD-10-CM | POA: Diagnosis not present

## 2022-02-18 DIAGNOSIS — Z5112 Encounter for antineoplastic immunotherapy: Secondary | ICD-10-CM | POA: Diagnosis not present

## 2022-02-18 MED ORDER — DOXYCYCLINE HYCLATE 100 MG PO TABS: 100.0000 mg | ORAL_TABLET | Freq: Two times a day (BID) | ORAL | 0 refills | Status: AC

## 2022-02-18 NOTE — Progress Notes (Signed)
[facility] at [facility] Telephone:([phone]) [phone] Fax:([phone]) [phone]  Patient Care Team: Latanya Maudlin, NP as PCP - General (Family Medicine) Daiva Huge, RN as Oncology Nurse Navigator Cammie Sickle, [name]   Name of the patient: Marilyn Page  [id number]  06/06/1976   Date of visit: 02/18/22  Reason for Consult: Marilyn Page is a 46 y.o. 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 Now it is red, swollen and she thinks it is infected. No known injury or trauma. No joint pain, fever or joint stiffness. She has tried Epsom salt soaks with slight improvement.   Denies any neurologic complaints. Denies recent fevers or illnesses. Denies any easy bleeding or bruising. Reports good appetite and denies weight loss. Denies chest pain. Denies any nausea, vomiting, constipation, or diarrhea. Denies urinary complaints. Patient offers no further specific complaints today.   PAST MEDICAL HISTORY: Past Medical History:  Diagnosis Date   Carcinoma of upper-outer quadrant of right breast in female, estrogen receptor positive ([place]) 10/26/2021   Diabetes mellitus without complication ([place])    History of gestational diabetes    Personal history of chemotherapy     PAST SURGICAL HISTORY:  Past Surgical History:  Procedure Laterality Date   BREAST BIOPSY Right 10/19/2021   [place] Core bx 10:00 6 cmfn Ribbon Clip-INVASIVE MAMMARY CARCINOMA,. Axilla Butterfly hydro clip-MACRO METASTATIC MAMMARY CARCINOMA   CESAREAN SECTION  2005/2008   DILATION AND CURETTAGE OF UTERUS  2003   PORTACATH PLACEMENT Left 11/09/2021   Procedure: INSERTION PORT-A-CATH;  Surgeon: Robert Bellow, MD;  Location: [facility] ORS;  Service: General;   Laterality: Left;   SKIN BIOPSY Right 11/09/2021   Procedure: PUNCH BIOPSY SKIN, TATTOO LYMPH NODE RIGHT AXILLA;  Surgeon: Robert Bellow, MD;  Location: [facility] ORS;  Service: General;  Laterality: Right;    HEMATOLOGY/ONCOLOGY HISTORY:  Oncology History Overview Note  MAY 2023-    Targeted ultrasound is performed, showing a 2.9 x 1.9 x 2.0 cm irregular hypoechoic mass right breast 10 o'clock position 6 cm from nipple at the site of palpable concern.   There is an abnormal 8 mm thickened right axillary lymph node.   There is skin thickening overlying the right breast. ------------------  A. BREAST, RIGHT AT 10:00, 6 CM FROM THE NIPPLE; ULTRASOUND-GUIDED CORE  NEEDLE BIOPSY:  - INVASIVE MAMMARY CARCINOMA, NO SPECIAL TYPE.   Size of invasive carcinoma: 11 mm in this sample  Histologic grade of invasive carcinoma: Grade 2                       Glandular/tubular differentiation score: 3                       Nuclear pleomorphism score: 2                       Mitotic rate score: 1                       Total score: 6  Ductal carcinoma in situ: Present, intermediate grade  Lymphovascular invasion: Not identified   B. LYMPH NODE, RIGHT AXILLARY; ULTRASOUND-GUIDED CORE NEEDLE BIOPSY:  - MACRO METASTATIC MAMMARY CARCINOMA,  MEASURING UP TO 6 MM IN GREATEST  EXTENT.   Comment:  The malignancy in the primary breast lesion appears morphologically  different from tumor in the lymph node sampling, with tumor present in  lymph node more suggestive of a histologic grade 3, with significantly  increased mitotic activity and pleomorphism. Due to the discrepancy in  these morphologic patterns, ER/PR/HER2 immunohistochemistry will be  performed on both blocks A1 and B1, with reflex to [place] for HER2 2+. The  results will be reported in an addendum.   ADDENDUM:  CASE SUMMARY: BREAST BIOMARKER TESTS - A - BREAST, RIGHT AT 10:00  Estrogen Receptor (ER) Status: POSITIVE          Percentage of  cells with nuclear positivity: 71-80%          Average intensity of staining: Strong   Progesterone Receptor (PgR) Status: POSITIVE          Percentage of cells with nuclear positivity: 81-90%          Average intensity of staining: Strong   HER2 (by immunohistochemistry): POSITIVE (Score 3+)       Percentage of cells with uniform intense complete membrane  staining: 61-70%  HER2 displays a heterogeneous staining pattern, with areas showing a  strong 3+ staining pattern, and other regions with complete absence (0+)  of staining.   Ki-67: Not performed   CASE SUMMARY: BREAST BIOMARKER TESTS - B - RIGHT AXILLARY LYMPH NODE  Estrogen Receptor (ER) Status: POSITIVE          Percentage of cells with nuclear positivity: 81-90%          Average intensity of staining: Strong   Progesterone Receptor (PgR) Status: POSITIVE          Percentage of cells with nuclear positivity: 51-60%          Average intensity of staining: Strong   HER2 (by immunohistochemistry): NEGATIVE (Score 1+)  Ki-67: Not performed   #  MAY 2023- STAGE III-T4N1 ER/PR positive HER2 POSITIVE right breast cancer;LN-positive-ER/PR positive however 2 NEGATIVE s/p biopsy [see discussion below].  Discussed with Dr. Sedalia Muta tumor heterogeneity. BREAST MRI- JUNE 2023- The patient's known malignancy in the upper outer quadrant of the right breast measures 3.8 x 2.8 x 3.6 cm. Numerous other suspicious masses are identified in the right breast as above involving the upper outer and upper inner quadrants, located both anterior and posterior to the known malignancy.  Numerous enhancing foci in the skin as above are worrisome for skin metastases ; Bx proven. Biopsy proven metastatic node in the right axilla.  No evidence of malignancy in the left breast;  Two mildly prominent right internal mammary nodes were not FDG avid on recent PET-CT imaging.  Currently on neoadjuvant chemotherapy- TCH plus P x6 cycles;  MUGA scan May 31st, 2023-  -61%.    # June 7th, 2023- NEO-ADJUVANT CHEMO- TCH+P   # Genetic counseling s/p- NEG for any deleterious mutations.     Carcinoma of upper-outer quadrant of right breast in female, estrogen receptor positive ([place])  10/26/2021 Initial Diagnosis   Carcinoma of upper-outer quadrant of right breast in female, estrogen receptor positive ([place])   10/26/2021 Cancer Staging   Staging form: Breast, AJCC 8th Edition - Clinical: Stage IB (cT2, cN1, cM0, G2, ER+, PR+, HER2+) - Signed by Cammie Sickle, MD on 10/26/2021 Histologic grading system: 3 grade system   11/10/2021 -  Chemotherapy   Patient is on Treatment Plan : BREAST  Docetaxel +  Carboplatin + Trastuzumab + Pertuzumab  (TCHP) q21d      11/11/2021 - 01/13/2022 Chemotherapy   Patient is on Treatment Plan : BREAST  Docetaxel + Carboplatin + Trastuzumab + Pertuzumab  (TCHP) q21d       Genetic Testing   Negative genetic testing. No pathogenic variants identified on the [facility] CancerNext-Expanded+RNA panel. The report date is 11/29/2021.  The CancerNext-Expanded + RNAinsight gene panel offered by [employer] and includes sequencing and rearrangement analysis for the following 77 genes: IP, ALK, APC*, ATM*, AXIN2, BAP1, BARD1, BLM, BMPR1A, BRCA1*, BRCA2*, BRIP1*, CDC73, CDH1*,CDK4, CDKN1B, CDKN2A, CHEK2*, CTNNA1, DICER1, FANCC, FH, FLCN, GALNT12, KIF1B, LZTR1, MAX, MEN1, MET, MLH1*, MSH2*, MSH3, MSH6*, MUTYH*, NBN, NF1*, NF2, NTHL1, PALB2*, PHOX2B, PMS2*, POT1, PRKAR1A, PTCH1, PTEN*, RAD51C*, RAD51D*,RB1, RECQL, RET, SDHA, SDHAF2, SDHB, SDHC, SDHD, SMAD4, SMARCA4, SMARCB1, SMARCE1, STK11, SUFU, TMEM127, TP53*,TSC1, TSC2, VHL and XRCC2 (sequencing and deletion/duplication); EGFR, EGLN1, HOXB13, KIT, MITF, PDGFRA, POLD1 and POLE (sequencing only); EPCAM and GREM1 (deletion/duplication only).     ALLERGIES:  is allergic to metformin.  MEDICATIONS:  Current Outpatient Medications  Medication Sig Dispense Refill   acetaminophen (TYLENOL) 325 MG  tablet Take 2 tablets (650 mg total) by mouth every 6 (six) hours as needed for mild pain (or Fever >/= 101).     alum & mag hydroxide-simeth (MAALOX/MYLANTA) 200-200-20 MG/5ML suspension Take 30 mLs by mouth every 4 (four) hours as needed for indigestion. 355 mL 0   ascorbic acid (VITAMIN C) 500 MG tablet Take 1 tablet (500 mg total) by mouth daily. 30 tablet    calcium-vitamin D (OSCAL WITH D) 500-5 MG-MCG tablet Take 1 tablet by mouth 2 (two) times daily. 60 tablet 2   chlorpheniramine-HYDROcodone 10-8 MG/5ML Take 5 mLs by mouth every 12 (twelve) hours as needed for cough. 115 mL 0   dexamethasone (DECADRON) 4 MG tablet One pill twice a day - Start the day prior to chemo; do not take the day of chemo. And take for 2 more days after the chemo. 40 tablet 0   doxycycline (VIBRA-TABS) 100 MG tablet Take 1 tablet (100 mg total) by mouth 2 (two) times daily for 10 days. 20 tablet 0   erythromycin ophthalmic ointment Place 1 Application into both eyes 2 (two) times daily. 3.5 g 0   feeding supplement (ENSURE ENLIVE / ENSURE PLUS) LIQD Take 237 mLs by mouth 3 (three) times daily between meals. 237 mL 12   ferrous sulfate 325 (65 FE) MG tablet Take 325 mg by mouth 2 (two) times daily with a meal.     guaiFENesin (MUCINEX) 600 MG 12 hr tablet Take 2 tablets (1,200 mg total) by mouth 2 (two) times daily.     Ipratropium-Albuterol (COMBIVENT) 20-100 MCG/ACT AERS respimat Inhale 1 puff into the lungs every 6 (six) hours. 4 g 0   lidocaine-prilocaine (EMLA) cream Apply on the port. 30 -45 min  prior to port access. 30 g 3   loperamide (IMODIUM) 2 MG capsule Take 1 capsule (2 mg total) by mouth as needed for diarrhea or loose stools. 30 capsule 0   magic mouthwash w/lidocaine SOLN Take 5 mLs by mouth 4 (four) times daily as needed for mouth pain. Sig: Swish/Swallow 5-10 ml four times a day as needed. Dispense 480 ml. 1RF 480 mL 1   Multiple Vitamin (MULTIVITAMIN WITH MINERALS) TABS tablet Take 1 tablet by mouth  daily.     norethindrone (MICRONOR) 0.35 MG tablet Take 1 tablet (0.35 mg total) by mouth daily.  84 tablet 2   ONETOUCH VERIO test strip SMARTSIG:1 Each Via Meter Daily PRN     phenol (CHLORASEPTIC) 1.4 % LIQD Use as directed 1 spray in the mouth or throat as needed for throat irritation / pain.  0   prochlorperazine (COMPAZINE) 10 MG tablet Take 1 tablet (10 mg total) by mouth every 6 (six) hours as needed for nausea or vomiting. 40 tablet 1   Semaglutide, 1 MG/DOSE, 4 MG/3ML SOPN Inject into the skin. Inject 1 mg (0.75 ml) subcutaneously once a week.     zinc sulfate 220 (50 Zn) MG capsule Take 1 capsule (220 mg total) by mouth daily.     No current facility-administered medications for this visit.    VITAL SIGNS: BP 98/78 (BP Location: Left Arm, Patient Position: Sitting, Cuff Size: Normal)   Pulse 100   Temp 98.8 F (37.1 C) (Tympanic)   Ht _0  (1.651 m)   Wt 219 lb 12.8 oz (99.7 kg)   SpO2 100%   BMI 36.58 kg/m  Filed Weights   02/18/22 1413  Weight: 219 lb 12.8 oz (99.7 kg)    Estimated body mass index is 36.58 kg/m as calculated from the following:   Height as of this encounter: _1  (1.651 m).   Weight as of this encounter: 219 lb 12.8 oz (99.7 kg).  LABS: CBC:    Component Value Date/Time   WBC 5.0 02/12/2022 0910   HGB 9.6 (L) 02/12/2022 0910   HGB 7.8 (L) 04/23/2021 1431   HCT 29.6 (L) 02/12/2022 0910   HCT 25.5 (L) 04/23/2021 1431   PLT 84 (L) 02/12/2022 0910   PLT 384 04/23/2021 1431   MCV 99.7 02/12/2022 0910   MCV 84 04/23/2021 1431   NEUTROABS 3.1 02/12/2022 0910   NEUTROABS 5.4 04/23/2021 1431   LYMPHSABS 1.2 02/12/2022 0910   LYMPHSABS 2.2 04/23/2021 1431   MONOABS 0.6 02/12/2022 0910   EOSABS 0.0 02/12/2022 0910   EOSABS 0.2 04/23/2021 1431   BASOSABS 0.0 02/12/2022 0910   BASOSABS 0.1 04/23/2021 1431   Comprehensive Metabolic Panel:    Component Value Date/Time   NA 138 02/12/2022 0910   NA 143 09/26/2017 1512   K 3.0 (L) 02/12/2022  0910   CL 105 02/12/2022 0910   CO2 26 02/12/2022 0910   BUN 8 02/12/2022 0910   BUN 9 09/26/2017 1512   CREATININE 0.83 02/12/2022 0910   GLUCOSE 160 (H) 02/12/2022 0910   CALCIUM 8.4 (L) 02/12/2022 0910   AST 28 02/02/2022 0811   ALT 27 02/02/2022 0811   ALKPHOS 45 02/02/2022 0811   BILITOT 0.5 02/02/2022 0811   BILITOT <0.2 09/26/2017 1512   PROT 5.9 (L) 02/02/2022 0811   PROT 6.8 09/26/2017 1512   ALBUMIN 3.1 (L) 02/02/2022 0811   ALBUMIN 3.9 09/26/2017 1512    RADIOGRAPHIC STUDIES: No results found.  PERFORMANCE STATUS (ECOG) : 1 - Symptomatic but completely ambulatory  Review of Systems Unless otherwise noted, a complete review of systems is negative.  Physical Exam General: NAD Cardiovascular: regular rate and rhythm Pulmonary: clear ant fields Abdomen: soft, nontender, + bowel sounds GU: no suprapubic tenderness Extremities: Mild edema of the distal right middle finger with mild erythema not extending proximally to DIP joint. Mobile. No trailing erythema. No frank discharge.  Skin: See below Neurological: Weakness but otherwise nonfocal       Assessment and Plan- Patient is a 46 y.o. female    Encounter Diagnoses  Name Primary?  Paronychia of finger, right Yes   Carcinoma of upper-outer quadrant of right breast in female, estrogen receptor positive ([place])    On antineoplastic chemotherapy    Paronychia- New. Treating with doxycyline. Discussed how to use along with common potential side effects and sun precautions. Discussed that should pain worsen over the weekend I would recommend ER or UC who has cautery capabilities. Epsom salt soaks can be helpful. Follow up on Tuesday as previously planned.     Patient expressed understanding and was in agreement with this plan. She also understands that She can call clinic at any time with any questions, concerns, or complaints.   Thank you for allowing me to participate in the care of this very pleasant patient.    Time Total: 25  Visit consisted of counseling and education dealing with the complex and emotionally intense issues of symptom management in the setting of serious illness.Greater than 50%  of this time was spent counseling and coordinating care related to the above assessment and plan.  Signed by: Nelwyn Salisbury, PA-C

## 2022-02-18 NOTE — Progress Notes (Signed)
C/o rt middle finger nail, swelling, black and sore to the touch. Concerned it may be infected.  Also, has a rash left>right hands.

## 2022-02-18 NOTE — Telephone Encounter (Signed)
Spoke with patient 1143- pt provided apt with Marilyn Page (pt's preference) at 230.

## 2022-02-19 ENCOUNTER — Other Ambulatory Visit: Payer: Self-pay

## 2022-02-23 ENCOUNTER — Other Ambulatory Visit: Payer: Self-pay | Admitting: Oncology

## 2022-02-23 DIAGNOSIS — Z170 Estrogen receptor positive status [ER+]: Secondary | ICD-10-CM

## 2022-02-24 ENCOUNTER — Other Ambulatory Visit: Payer: Self-pay

## 2022-02-25 MED FILL — Dexamethasone Sodium Phosphate Inj 100 MG/10ML: INTRAMUSCULAR | Qty: 1 | Status: AC

## 2022-02-25 MED FILL — Fosaprepitant Dimeglumine For IV Infusion 150 MG (Base Eq): INTRAVENOUS | Qty: 5 | Status: AC

## 2022-02-26 ENCOUNTER — Other Ambulatory Visit: Payer: 59

## 2022-02-26 ENCOUNTER — Inpatient Hospital Stay: Payer: 59

## 2022-02-26 ENCOUNTER — Other Ambulatory Visit: Payer: Self-pay

## 2022-02-26 ENCOUNTER — Encounter: Payer: Self-pay | Admitting: Nurse Practitioner

## 2022-02-26 ENCOUNTER — Ambulatory Visit: Payer: 59

## 2022-02-26 ENCOUNTER — Ambulatory Visit: Payer: 59 | Admitting: Nurse Practitioner

## 2022-02-26 ENCOUNTER — Inpatient Hospital Stay (HOSPITAL_BASED_OUTPATIENT_CLINIC_OR_DEPARTMENT_OTHER): Payer: 59 | Admitting: Nurse Practitioner

## 2022-02-26 VITALS — BP 113/55 | HR 67 | Temp 97.0°F | Resp 20 | Ht 65.0 in | Wt 222.0 lb

## 2022-02-26 DIAGNOSIS — Z5111 Encounter for antineoplastic chemotherapy: Secondary | ICD-10-CM

## 2022-02-26 DIAGNOSIS — Z170 Estrogen receptor positive status [ER+]: Secondary | ICD-10-CM | POA: Diagnosis not present

## 2022-02-26 DIAGNOSIS — C50411 Malignant neoplasm of upper-outer quadrant of right female breast: Secondary | ICD-10-CM

## 2022-02-26 DIAGNOSIS — E860 Dehydration: Secondary | ICD-10-CM

## 2022-02-26 DIAGNOSIS — Z5112 Encounter for antineoplastic immunotherapy: Secondary | ICD-10-CM | POA: Diagnosis not present

## 2022-02-26 DIAGNOSIS — E876 Hypokalemia: Secondary | ICD-10-CM

## 2022-02-26 LAB — CBC WITH DIFFERENTIAL/PLATELET
Abs Immature Granulocytes: 0.07 10*3/uL (ref 0.00–0.07)
Basophils Absolute: 0 10*3/uL (ref 0.0–0.1)
Basophils Relative: 0 %
Eosinophils Absolute: 0 10*3/uL (ref 0.0–0.5)
Eosinophils Relative: 0 %
HCT: 29.2 % — ABNORMAL LOW (ref 36.0–46.0)
Hemoglobin: 9.7 g/dL — ABNORMAL LOW (ref 12.0–15.0)
Immature Granulocytes: 1 %
Lymphocytes Relative: 11 %
Lymphs Abs: 0.8 10*3/uL (ref 0.7–4.0)
MCH: 33.9 pg (ref 26.0–34.0)
MCHC: 33.2 g/dL (ref 30.0–36.0)
MCV: 102.1 fL — ABNORMAL HIGH (ref 80.0–100.0)
Monocytes Absolute: 0.3 10*3/uL (ref 0.1–1.0)
Monocytes Relative: 5 %
Neutro Abs: 5.8 10*3/uL (ref 1.7–7.7)
Neutrophils Relative %: 83 %
Platelets: 160 10*3/uL (ref 150–400)
RBC: 2.86 MIL/uL — ABNORMAL LOW (ref 3.87–5.11)
RDW: 19.4 % — ABNORMAL HIGH (ref 11.5–15.5)
WBC: 6.9 10*3/uL (ref 4.0–10.5)
nRBC: 0 % (ref 0.0–0.2)

## 2022-02-26 LAB — COMPREHENSIVE METABOLIC PANEL
ALT: 21 U/L (ref 0–44)
AST: 25 U/L (ref 15–41)
Albumin: 3 g/dL — ABNORMAL LOW (ref 3.5–5.0)
Alkaline Phosphatase: 45 U/L (ref 38–126)
Anion gap: 4 — ABNORMAL LOW (ref 5–15)
BUN: 12 mg/dL (ref 6–20)
CO2: 23 mmol/L (ref 22–32)
Calcium: 8.4 mg/dL — ABNORMAL LOW (ref 8.9–10.3)
Chloride: 108 mmol/L (ref 98–111)
Creatinine, Ser: 0.63 mg/dL (ref 0.44–1.00)
GFR, Estimated: >60 mL/min (ref >60)
Glucose, Bld: 179 mg/dL — ABNORMAL HIGH (ref 70–99)
Potassium: 3.4 mmol/L — ABNORMAL LOW (ref 3.5–5.1)
Sodium: 135 mmol/L (ref 135–145)
Total Bilirubin: 0.5 mg/dL (ref 0.3–1.2)
Total Protein: 5.8 g/dL — ABNORMAL LOW (ref 6.5–8.1)

## 2022-02-26 LAB — MAGNESIUM: Magnesium: 1.7 mg/dL (ref 1.7–2.4)

## 2022-02-26 MED ORDER — SODIUM CHLORIDE 0.9 % IV SOLN
Freq: Once | INTRAVENOUS | Status: AC
  Filled 2022-02-26: qty 250

## 2022-02-26 MED ORDER — HEPARIN SOD (PORK) LOCK FLUSH 100 UNIT/ML IV SOLN
500.0000 [IU] | Freq: Once | INTRAVENOUS | Status: AC | PRN
  Filled 2022-02-26: qty 5

## 2022-02-26 MED ORDER — HEPARIN SOD (PORK) LOCK FLUSH 100 UNIT/ML IV SOLN
INTRAVENOUS | Status: AC
  Administered 2022-02-26: 500 [IU]
  Filled 2022-02-26: qty 5

## 2022-02-26 MED ORDER — TRASTUZUMAB-DKST CHEMO 150 MG IV SOLR
6.0000 mg/kg | Freq: Once | INTRAVENOUS | Status: AC
  Administered 2022-02-26: 609 mg via INTRAVENOUS
  Filled 2022-02-26: qty 29

## 2022-02-26 MED ORDER — SODIUM CHLORIDE 0.9 % IV SOLN
150.0000 mg | Freq: Once | INTRAVENOUS | Status: AC
  Administered 2022-02-26: 150 mg via INTRAVENOUS
  Filled 2022-02-26: qty 150

## 2022-02-26 MED ORDER — SODIUM CHLORIDE 0.9 % IV SOLN
900.0000 mg | Freq: Once | INTRAVENOUS | Status: AC
  Administered 2022-02-26: 900 mg via INTRAVENOUS
  Filled 2022-02-26: qty 90

## 2022-02-26 MED ORDER — SODIUM CHLORIDE 0.9 % IV SOLN
420.0000 mg | Freq: Once | INTRAVENOUS | Status: AC
  Administered 2022-02-26: 420 mg via INTRAVENOUS
  Filled 2022-02-26: qty 14

## 2022-02-26 MED ORDER — SODIUM CHLORIDE 0.9 % IV SOLN
75.0000 mg/m2 | Freq: Once | INTRAVENOUS | Status: AC
  Administered 2022-02-26: 160 mg via INTRAVENOUS
  Filled 2022-02-26: qty 16

## 2022-02-26 MED ORDER — SODIUM CHLORIDE 0.9 % IV SOLN
10.0000 mg | Freq: Once | INTRAVENOUS | Status: AC
  Administered 2022-02-26: 10 mg via INTRAVENOUS
  Filled 2022-02-26: qty 10

## 2022-02-26 MED ORDER — ACETAMINOPHEN 325 MG PO TABS
650.0000 mg | ORAL_TABLET | Freq: Once | ORAL | Status: AC
  Administered 2022-02-26: 650 mg via ORAL
  Filled 2022-02-26: qty 2

## 2022-02-26 MED ORDER — PALONOSETRON HCL INJECTION 0.25 MG/5ML
0.2500 mg | Freq: Once | INTRAVENOUS | Status: AC
  Administered 2022-02-26: 0.25 mg via INTRAVENOUS
  Filled 2022-02-26: qty 5

## 2022-02-26 MED ORDER — DIPHENHYDRAMINE HCL 25 MG PO CAPS
50.0000 mg | ORAL_CAPSULE | Freq: Once | ORAL | Status: AC
  Administered 2022-02-26: 50 mg via ORAL
  Filled 2022-02-26: qty 2

## 2022-02-26 NOTE — Progress Notes (Signed)
[name] NOTE  Patient Care Team: Latanya Maudlin, NP as PCP - General (Family Medicine) Daiva Huge, RN as Oncology Nurse Navigator Cammie Sickle, [name]  CHIEF COMPLAINTS/PURPOSE OF CONSULTATION: Breast cancer  Oncology History Overview Note  MAY 2023-    Targeted ultrasound is performed, showing a 2.9 x 1.9 x 2.0 cm irregular hypoechoic mass right breast 10 o'clock position 6 cm from nipple at the site of palpable concern.   There is an abnormal 8 mm thickened right axillary lymph node.   There is skin thickening overlying the right breast. ------------------  A. BREAST, RIGHT AT 10:00, 6 CM FROM THE NIPPLE; ULTRASOUND-GUIDED CORE  NEEDLE BIOPSY:  - INVASIVE MAMMARY CARCINOMA, NO SPECIAL TYPE.   Size of invasive carcinoma: 11 mm in this sample  Histologic grade of invasive carcinoma: Grade 2                       Glandular/tubular differentiation score: 3                       Nuclear pleomorphism score: 2                       Mitotic rate score: 1                       Total score: 6  Ductal carcinoma in situ: Present, intermediate grade  Lymphovascular invasion: Not identified   B. LYMPH NODE, RIGHT AXILLARY; ULTRASOUND-GUIDED CORE NEEDLE BIOPSY:  - 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 IN GREATEST  EXTENT.   Comment:  The malignancy in the primary breast lesion appears morphologically  different from tumor in the lymph node sampling, with tumor present in  lymph node more suggestive of a histologic grade 3, with significantly  increased mitotic activity and pleomorphism. Due to the discrepancy in  these morphologic patterns, ER/PR/HER2 immunohistochemistry will be  performed on both blocks A1 and B1, with reflex to [place] for HER2 2+. The  results will be reported in an addendum.   ADDENDUM:  CASE SUMMARY: BREAST BIOMARKER TESTS - A - BREAST, RIGHT AT 10:00  Estrogen Receptor  (ER) Status: POSITIVE          Percentage of cells with nuclear positivity: 71-80%          Average intensity of staining: Strong   Progesterone Receptor (PgR) Status: POSITIVE          Percentage of cells with nuclear positivity: 81-90%          Average intensity of staining: Strong   HER2 (by immunohistochemistry): POSITIVE (Score 3+)       Percentage of cells with uniform intense complete membrane  staining: 61-70%  HER2 displays a heterogeneous staining pattern, with areas showing a  strong 3+ staining pattern, and other regions with complete absence (0+)  of staining.   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  Discussed with Dr. Sedalia Muta tumor heterogeneity. BREAST MRI- JUNE 2023- The patient's known malignancy in the upper outer quadrant of the right breast measures 3.8 x 2.8 x 3.6 cm. Numerous other suspicious masses are identified in the right breast as above involving the upper outer and upper inner quadrants, located both anterior and posterior to the known malignancy.  Numerous enhancing foci in the skin as above are worrisome for skin metastases ; Bx proven. Biopsy proven metastatic node in the right axilla.  No evidence of malignancy in the left breast;  Two mildly prominent right internal mammary nodes were not FDG avid on recent PET-CT imaging.  Currently on neoadjuvant chemotherapy- TCH  plus P x6 cycles;  MUGA scan May 31st, 2023- -61%.    # June 7th, 2023- NEO-ADJUVANT CHEMO- TCH+P   # Genetic counseling s/p- NEG for any deleterious mutations.     Carcinoma of upper-outer quadrant of right breast in female, estrogen receptor positive ([place])  10/26/2021 Initial Diagnosis   Carcinoma of upper-outer quadrant of right breast in female, estrogen receptor positive ([place])   10/26/2021 Cancer Staging   Staging form: Breast, AJCC 8th Edition - Clinical: Stage IB (cT2, cN1, cM0, G2, ER+, PR+, HER2+) - Signed by Cammie Sickle, MD on 10/26/2021 Histologic grading system: 3 grade system   11/10/2021 -  Chemotherapy   Patient is on Treatment Plan : BREAST  Docetaxel + Carboplatin + Trastuzumab + Pertuzumab  (TCHP) q21d      11/11/2021 - 01/13/2022 Chemotherapy   Patient is on Treatment Plan : BREAST  Docetaxel + Carboplatin + Trastuzumab + Pertuzumab  (TCHP) q21d       Genetic Testing   Negative genetic testing. No pathogenic variants identified on the [facility] CancerNext-Expanded+RNA panel. The report date is 11/29/2021.  The CancerNext-Expanded + RNAinsight gene panel offered by [employer] and includes sequencing and rearrangement analysis for the following 77 genes: IP, ALK, APC*, ATM*, AXIN2, BAP1, BARD1, BLM, BMPR1A, BRCA1*, BRCA2*, BRIP1*, CDC73, CDH1*,CDK4, CDKN1B, CDKN2A, CHEK2*, CTNNA1, DICER1, FANCC, FH, FLCN, GALNT12, KIF1B, LZTR1, MAX, MEN1, MET, MLH1*, MSH2*, MSH3, MSH6*, MUTYH*, NBN, NF1*, NF2, NTHL1, PALB2*, PHOX2B, PMS2*, POT1, PRKAR1A, PTCH1, PTEN*, RAD51C*, RAD51D*,RB1, RECQL, RET, SDHA, SDHAF2, SDHB, SDHC, SDHD, SMAD4, SMARCA4, SMARCB1, SMARCE1, STK11, SUFU, TMEM127, TP53*,TSC1, TSC2, VHL and XRCC2 (sequencing and deletion/duplication); EGFR, EGLN1, HOXB13, KIT, MITF, PDGFRA, POLD1 and POLE (sequencing only); EPCAM and GREM1 (deletion/duplication only).     HISTORY OF PRESENTING ILLNESS: Patient is ambulating independently.  Accompanied by her husband.   Marilyn Page 46 y.o.  female right breast TRIPLE POSITIVE with T4- Stage III currently on neoadjuvant TCHP chemotherapy is here for follow-up and consideration of cycle 6 of chemotherapy. Continues to tolerate treatment well. Has mild nausea but controlled with antiemetics. Receives fluids in between cycles which have helped her to feel better. Complains of mild tingling and numbness of extremities which is unchanged. Fatigue is stable. No rash.   Review of Systems  Constitutional:  Negative for chills, diaphoresis, fever, malaise/fatigue and weight loss.  HENT:  Negative for nosebleeds and sore throat.   Eyes:  Negative for double vision.  Respiratory:  Negative for cough, hemoptysis, sputum production, shortness of breath and wheezing.   Cardiovascular:  Negative for chest pain,  palpitations, orthopnea and leg swelling.  Gastrointestinal:  Negative for abdominal pain, blood in stool, constipation, diarrhea, heartburn, melena, nausea and vomiting.  Genitourinary:  Negative for dysuria, frequency and urgency.  Musculoskeletal:  Negative for back pain and joint pain.  Skin: Negative.  Negative for itching and rash.  Neurological:  Negative for dizziness, tingling, focal weakness, weakness and headaches.  Endo/Heme/Allergies:  Does not bruise/bleed easily.  Psychiatric/Behavioral:  Negative for depression. The patient is not nervous/anxious and does not have insomnia.     MEDICAL HISTORY:  Past Medical History:  Diagnosis Date   Carcinoma of upper-outer quadrant of right breast in female, estrogen receptor positive ([place]) 10/26/2021   Diabetes mellitus without complication ([place])    History of gestational diabetes    Personal history of chemotherapy     SURGICAL HISTORY: Past Surgical History:  Procedure Laterality Date   BREAST BIOPSY Right 10/19/2021   [place] Core bx 10:00 6 cmfn Ribbon Clip-INVASIVE MAMMARY CARCINOMA,. Axilla Butterfly hydro clip-MACRO METASTATIC MAMMARY CARCINOMA   CESAREAN SECTION   2005/2008   DILATION AND CURETTAGE OF UTERUS  2003   PORTACATH PLACEMENT Left 11/09/2021   Procedure: INSERTION PORT-A-CATH;  Surgeon: Robert Bellow, MD;  Location: [facility] ORS;  Service: General;  Laterality: Left;   SKIN BIOPSY Right 11/09/2021   Procedure: PUNCH BIOPSY SKIN, TATTOO LYMPH NODE RIGHT AXILLA;  Surgeon: Robert Bellow, MD;  Location: [facility] ORS;  Service: General;  Laterality: Right;    SOCIAL HISTORY: Social History   Socioeconomic History   Marital status: Married    Spouse name: Jaci Standard   Number of children: 2   Years of education: Not on file   Highest education level: Not on file  Occupational History   Occupation: and Optometrist  Tobacco Use   Smoking status: Never   Smokeless tobacco: Never  Vaping Use   Vaping Use: Never used  Substance and Sexual Activity   Alcohol use: Never   Drug use: Never   Sexual activity: Yes    Birth control/protection: Other-see comments, Surgical    Comment: vasectomy  Other Topics Concern   Not on file  Social History Narrative   Pre-school teacher; in [place]; 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    Social Determinants of Health   Financial Resource Strain: Not on file  Food Insecurity: Not on file  Transportation Needs: Not on file  Physical Activity: Inactive (09/26/2017)   Exercise Vital Sign    Days of Exercise per Week: 0 days    Minutes of Exercise per Session: 0 min  Stress: Not on file  Social Connections: Not on file  Intimate Partner Violence: Not on file    FAMILY HISTORY: Family History  Problem Relation Age of Onset   Ovarian cysts Mother    Migraines Mother    Melanoma Mother        on leg   Hyperlipidemia Father    Diabetes Maternal Aunt    Stomach cancer Paternal Aunt    Diabetes Maternal Grandfather    Lymphoma Cousin 17   Breast cancer Neg Hx     ALLERGIES:  is allergic to metformin.  MEDICATIONS:  Current Outpatient Medications  Medication Sig Dispense Refill   acetaminophen  (TYLENOL) 325 MG tablet Take 2 tablets (650 mg total) by mouth every 6 (six) hours as needed for mild pain (or Fever >/= 101).     alum & mag hydroxide-simeth (MAALOX/MYLANTA) 200-200-20 MG/5ML suspension Take 30 mLs by mouth every 4 (four) hours as needed  for indigestion. 355 mL 0   ascorbic acid (VITAMIN C) 500 MG tablet Take 1 tablet (500 mg total) by mouth daily. 30 tablet    calcium-vitamin D (OSCAL WITH D) 500-5 MG-MCG tablet Take 1 tablet by mouth 2 (two) times daily. 60 tablet 2   dexamethasone (DECADRON) 4 MG tablet One pill twice a day - Start the day prior to chemo; do not take the day of chemo. And take for 2 more days after the chemo. 40 tablet 0   doxycycline (VIBRA-TABS) 100 MG tablet Take 1 tablet (100 mg total) by mouth 2 (two) times daily for 10 days. 20 tablet 0   erythromycin ophthalmic ointment Place 1 Application into both eyes 2 (two) times daily. 3.5 g 0   feeding supplement (ENSURE ENLIVE / ENSURE PLUS) LIQD Take 237 mLs by mouth 3 (three) times daily between meals. 237 mL 12   ferrous sulfate 325 (65 FE) MG tablet Take 325 mg by mouth 2 (two) times daily with a meal.     guaiFENesin (MUCINEX) 600 MG 12 hr tablet Take 2 tablets (1,200 mg total) by mouth 2 (two) times daily.     Ipratropium-Albuterol (COMBIVENT) 20-100 MCG/ACT AERS respimat Inhale 1 puff into the lungs every 6 (six) hours. 4 g 0   lidocaine-prilocaine (EMLA) cream Apply on the port. 30 -45 min  prior to port access. 30 g 3   loperamide (IMODIUM) 2 MG capsule Take 1 capsule (2 mg total) by mouth as needed for diarrhea or loose stools. 30 capsule 0   magic mouthwash w/lidocaine SOLN Take 5 mLs by mouth 4 (four) times daily as needed for mouth pain. Sig: Swish/Swallow 5-10 ml four times a day as needed. Dispense 480 ml. 1RF 480 mL 1   Multiple Vitamin (MULTIVITAMIN WITH MINERALS) TABS tablet Take 1 tablet by mouth daily.     norethindrone (MICRONOR) 0.35 MG tablet Take 1 tablet (0.35 mg total) by mouth daily. 84  tablet 2   ONETOUCH VERIO test strip SMARTSIG:1 Each Via Meter Daily PRN     phenol (CHLORASEPTIC) 1.4 % LIQD Use as directed 1 spray in the mouth or throat as needed for throat irritation / pain.  0   prochlorperazine (COMPAZINE) 10 MG tablet Take 1 tablet (10 mg total) by mouth every 6 (six) hours as needed for nausea or vomiting. 40 tablet 1   Semaglutide, 1 MG/DOSE, 4 MG/3ML SOPN Inject into the skin. Inject 1 mg (0.75 ml) subcutaneously once a week.     zinc sulfate 220 (50 Zn) MG capsule Take 1 capsule (220 mg total) by mouth daily.     No current facility-administered medications for this visit.   Marland Kitchen  PHYSICAL EXAMINATION: ECOG PERFORMANCE STATUS: 1 - Symptomatic but completely ambulatory  Vitals:   02/26/22 0835 02/26/22 0836  BP:  (!) 113/55  Pulse:  67  Resp: 20   Temp:  (!) 97 F (36.1 C)   Filed Weights   02/26/22 0835  Weight: 222 lb (100.7 kg)    Physical Exam Constitutional:      Appearance: She is not ill-appearing.  Eyes:     General: No scleral icterus.    Conjunctiva/sclera: Conjunctivae normal.  Cardiovascular:     Rate and Rhythm: Normal rate and regular rhythm.  Chest:  Breasts:    Right: Mass present. No tenderness.     Comments: Right breast-9-10:00 Irregular 2 cm irregular mass, firm. Skin thickening has resolved. Abdominal:     General: There is  no distension.     Palpations: Abdomen is soft.     Tenderness: There is no abdominal tenderness. There is no guarding.  Musculoskeletal:        General: No deformity.     Right lower leg: No edema.     Left lower leg: No edema.  Lymphadenopathy:     Cervical: No cervical adenopathy.     Upper Body:     Right upper body: No axillary adenopathy.  Skin:    General: Skin is warm and dry.  Neurological:     Mental Status: She is alert and oriented to person, place, and time. Mental status is at baseline.  Psychiatric:        Mood and Affect: Mood normal.        Behavior: Behavior normal.       LABORATORY DATA:  I have reviewed the data as listed Lab Results  Component Value Date   WBC 6.9 02/26/2022   HGB 9.7 (L) 02/26/2022   HCT 29.2 (L) 02/26/2022   MCV 102.1 (H) 02/26/2022   PLT 160 02/26/2022   Recent Labs    12/27/21 2105 12/28/21 0329 01/12/22 0823 01/22/22 0935 02/02/22 0811 02/12/22 0910 02/26/22 0823  NA 134*   < > 139   < > 137 138 135  K 3.6   < > 3.9   < > 3.6 3.0* 3.4*  CL 104   < > 107   < > 108 105 108  CO2 22   < > 25   < > $R'25 26 23  'uw$ GLUCOSE 192*   < > 203*   < > 175* 160* 179*  BUN 16   < > 10   < > $R'13 8 12  'qG$ CREATININE 0.93   < > 0.80   < > 0.59 0.83 0.63  CALCIUM 8.6*   < > 9.5   < > 8.7* 8.4* 8.4*  GFRNONAA >60   < > >60   < > >60 >60 >60  PROT 7.0   < > 6.6  --  5.9*  --  5.8*  ALBUMIN 3.7   < > 3.4*  --  3.1*  --  3.0*  AST 42*   < > 30  --  28  --  25  ALT 51*   < > 28  --  27  --  21  ALKPHOS 70   < > 53  --  45  --  45  BILITOT 1.1   < > 0.4  --  0.5  --  0.5  BILIDIR 0.2  --   --   --   --   --   --   IBILI 0.9  --   --   --   --   --   --    < > = values in this interval not displayed.     RADIOGRAPHIC STUDIES: I have personally reviewed the radiological images as listed and agreed with the findings in the report. No results found.  ASSESSMENT & PLAN:   No problem-specific Assessment & Plan notes found for this encounter.  Carcinoma of upper-outer quadrant of right breast in female, estrogen receptor positive ([place]) # MAY 2023- STAGE III-T4N1 ER/PR positive HER2 POSITIVE right breast cancer;LN-positive-ER/PR positive however 2 NEGATIVE s/p biopsy [see discussion below].  Discussed with Dr. Sedalia Muta tumor heterogeneity. BREAST MRI- JUNE 2023- The patient's known malignancy in the upper outer quadrant of the right breast measures 3.8 x 2.8 x 3.6  cm. Numerous other suspicious masses are identified in the right breast as above involving the upper outer and upper inner quadrants, located both anterior and posterior to the  known malignancy.  Numerous enhancing foci in the skin as above are worrisome for skin metastases; Two mildly prominent right internal mammary nodes were not FDG avid on recent PET-CT imaging.     # Currently on neoadjuvant chemotherapy- TCH+P x3   MUGA scan May 31st, 2023- -61%. 3rd, AUG 2023-mammogram/ultrasound -Slightly decreased size of the palpable biopsy-proven malignancy in the right breast 10 o'clock position, measuring up to 2.0 cm and previously measuring up to 2.9 cm;  Persistent right breast skin thickening;  Decreased right axillary lymphadenopathy.  Updated Dr. Bary Castilla.   # Proceed with cycle #6   d-2udenyca.  Discussed regarding use of Claritin post growth factor. Labs today reviewed;  acceptable for treatment today.  We will plan to proceed with repeat CT imaging post therapy. Ordered today. Per Dr. Bary Castilla no indication for repeat mammogram. He will schedule patient for pre-op evaluation.    # Hypotension: Systolic 68S; unclear etiology- HR- 80s.  Increased oral fluid intake and interval IV fluids. Now resolved.    #Weight gain/fluid retention-from underlying chemotherapy Taxotere; steroids.  Recommend taking dexamethasone 4 mg twice a day  postchemotherapy.  Patient currently getting none after chemotherapy.   # PN G-1-2 sec to Taxotere- monitor for now. STABLE.    # DISPOSITION: # proceed with Chemo Today; D-2 neuslata as planned. # 10 days- lab (cbc, bmp), Sarah, possible fluids # See Dr. Bary Castilla for surgery.  # 2.5 weeks- CT chest/abdomen/pelvis and follow up with Dr. Rogue Bussing for results a few days later- la  All questions were answered. The patient/family knows to call the clinic with any problems, questions or concerns.  Verlon Au, NP 02/26/2022

## 2022-02-26 NOTE — Patient Instructions (Signed)
[facility] AT [place]  Discharge Instructions: Thank you for choosing [facility] to provide your oncology and hematology care.  If you have a lab appointment with the [facility], please go directly to the [facility] and check in at the registration area.  Wear comfortable clothing and clothing appropriate for easy access to any Portacath or PICC line.   We strive to give you quality time with your provider. You may need to reschedule your appointment if you arrive late (15 or more minutes).  Arriving late affects you and other patients whose appointments are after yours.  Also, if you miss three or more appointments without notifying the office, you may be dismissed from the clinic at the provider's discretion.      For prescription refill requests, have your pharmacy contact our office and allow 72 hours for refills to be completed.    Today you received the following chemotherapy and/or immunotherapy agents Ogivri, Perjeta, Taxotere & Paraplatin      To help prevent nausea and vomiting after your treatment, we encourage you to take your nausea medication as directed.  BELOW ARE SYMPTOMS THAT SHOULD BE REPORTED IMMEDIATELY: *FEVER GREATER THAN 100.4 F (38 C) OR HIGHER *CHILLS OR SWEATING *NAUSEA AND VOMITING THAT IS NOT CONTROLLED WITH YOUR NAUSEA MEDICATION *UNUSUAL SHORTNESS OF BREATH *UNUSUAL BRUISING OR BLEEDING *URINARY PROBLEMS (pain or burning when urinating, or frequent urination) *BOWEL PROBLEMS (unusual diarrhea, constipation, pain near the anus) TENDERNESS IN MOUTH AND THROAT WITH OR WITHOUT PRESENCE OF ULCERS (sore throat, sores in mouth, or a toothache) UNUSUAL RASH, SWELLING OR PAIN  UNUSUAL VAGINAL DISCHARGE OR ITCHING   Items with * indicate a potential emergency and should be followed up as soon as possible or go to the Emergency Department if any problems should occur.  Please show the CHEMOTHERAPY ALERT CARD or  IMMUNOTHERAPY ALERT CARD at check-in to the Emergency Department and triage nurse.  Should you have questions after your visit or need to cancel or reschedule your appointment, please contact [facility] [place] AT [place]  [phone] and follow the prompts.  Office hours are 8:00 a.m. to 4:30 p.m. Monday - Friday. Please note that voicemails left after 4:00 p.m. may not be returned until the following business day.  We are closed weekends and major holidays. You have access to a nurse at all times for urgent questions. Please call the main number to the clinic [phone] and follow the prompts.  For any non-urgent questions, you may also contact your provider using MyChart. We now offer e-Visits for anyone 30 and older to request care online for non-urgent symptoms. For details visit [email or web address].   Also download the MyChart app! Go to the app store, search "MyChart", open the app, select [facility], and log in with your MyChart username and password.  Masks are optional in the cancer centers. If you would like for your care team to wear a mask while they are taking care of you, please let them know. For doctor visits, patients may have with them one support person who is at least 47 years old. At this time, visitors are not allowed in the infusion area.

## 2022-02-27 ENCOUNTER — Other Ambulatory Visit: Payer: Self-pay

## 2022-03-01 ENCOUNTER — Inpatient Hospital Stay: Payer: 59

## 2022-03-01 ENCOUNTER — Other Ambulatory Visit: Payer: Self-pay | Admitting: Internal Medicine

## 2022-03-01 DIAGNOSIS — Z5112 Encounter for antineoplastic immunotherapy: Secondary | ICD-10-CM | POA: Diagnosis not present

## 2022-03-01 DIAGNOSIS — Z170 Estrogen receptor positive status [ER+]: Secondary | ICD-10-CM

## 2022-03-01 MED ORDER — PEGFILGRASTIM INJECTION 6 MG/0.6ML ~~LOC~~
6.0000 mg | PREFILLED_SYRINGE | Freq: Once | SUBCUTANEOUS | Status: AC
  Administered 2022-03-01: 6 mg via SUBCUTANEOUS
  Filled 2022-03-01: qty 0.6

## 2022-03-02 ENCOUNTER — Other Ambulatory Visit: Payer: Self-pay

## 2022-03-05 ENCOUNTER — Telehealth: Payer: Self-pay | Admitting: *Deleted

## 2022-03-05 ENCOUNTER — Inpatient Hospital Stay
Admission: EM | Admit: 2022-03-05 | Discharge: 2022-03-07 | DRG: 809 | Disposition: A | Discharge status: Home / Self Care | Payer: 59 | Attending: Family Medicine | Admitting: Family Medicine

## 2022-03-05 ENCOUNTER — Emergency Department: Payer: 59

## 2022-03-05 ENCOUNTER — Other Ambulatory Visit: Payer: Self-pay | Admitting: Internal Medicine

## 2022-03-05 ENCOUNTER — Other Ambulatory Visit: Payer: Self-pay

## 2022-03-05 ENCOUNTER — Encounter: Payer: Self-pay | Admitting: Emergency Medicine

## 2022-03-05 ENCOUNTER — Other Ambulatory Visit: Payer: Self-pay | Admitting: Nurse Practitioner

## 2022-03-05 DIAGNOSIS — Z79899 Other long term (current) drug therapy: Secondary | ICD-10-CM

## 2022-03-05 DIAGNOSIS — Z1152 Encounter for screening for COVID-19: Secondary | ICD-10-CM

## 2022-03-05 DIAGNOSIS — T451X5A Adverse effect of antineoplastic and immunosuppressive drugs, initial encounter: Secondary | ICD-10-CM | POA: Diagnosis present

## 2022-03-05 DIAGNOSIS — D709 Neutropenia, unspecified: Secondary | ICD-10-CM | POA: Diagnosis present

## 2022-03-05 DIAGNOSIS — E119 Type 2 diabetes mellitus without complications: Secondary | ICD-10-CM | POA: Diagnosis present

## 2022-03-05 DIAGNOSIS — Z170 Estrogen receptor positive status [ER+]: Secondary | ICD-10-CM | POA: Diagnosis not present

## 2022-03-05 DIAGNOSIS — Z9221 Personal history of antineoplastic chemotherapy: Secondary | ICD-10-CM | POA: Diagnosis not present

## 2022-03-05 DIAGNOSIS — I959 Hypotension, unspecified: Secondary | ICD-10-CM | POA: Diagnosis present

## 2022-03-05 DIAGNOSIS — Z8616 Personal history of COVID-19: Secondary | ICD-10-CM

## 2022-03-05 DIAGNOSIS — Z888 Allergy status to other drugs, medicaments and biological substances status: Secondary | ICD-10-CM | POA: Diagnosis not present

## 2022-03-05 DIAGNOSIS — E872 Acidosis, unspecified: Secondary | ICD-10-CM | POA: Diagnosis present

## 2022-03-05 DIAGNOSIS — Z853 Personal history of malignant neoplasm of breast: Secondary | ICD-10-CM | POA: Diagnosis not present

## 2022-03-05 DIAGNOSIS — Z7985 Long-term (current) use of injectable non-insulin antidiabetic drugs: Secondary | ICD-10-CM

## 2022-03-05 DIAGNOSIS — D696 Thrombocytopenia, unspecified: Secondary | ICD-10-CM | POA: Diagnosis present

## 2022-03-05 DIAGNOSIS — C50911 Malignant neoplasm of unspecified site of right female breast: Secondary | ICD-10-CM | POA: Diagnosis present

## 2022-03-05 DIAGNOSIS — R5081 Fever presenting with conditions classified elsewhere: Secondary | ICD-10-CM | POA: Diagnosis present

## 2022-03-05 DIAGNOSIS — R509 Fever, unspecified: Secondary | ICD-10-CM

## 2022-03-05 LAB — CBC WITH DIFFERENTIAL/PLATELET
Basophils Absolute: 0 10*3/uL (ref 0.0–0.1)
Basophils Relative: 0 %
Eosinophils Absolute: 0 10*3/uL (ref 0.0–0.5)
Eosinophils Relative: 0 %
HCT: 29.1 % — ABNORMAL LOW (ref 36.0–46.0)
Hemoglobin: 9.5 g/dL — ABNORMAL LOW (ref 12.0–15.0)
Lymphocytes Relative: 58 %
Lymphs Abs: 0.2 10*3/uL — ABNORMAL LOW (ref 0.7–4.0)
MCH: 32.9 pg (ref 26.0–34.0)
MCHC: 32.6 g/dL (ref 30.0–36.0)
MCV: 100.1 fL — ABNORMAL HIGH (ref 80.0–100.0)
Monocytes Absolute: 0.2 10*3/uL (ref 0.1–1.0)
Monocytes Relative: 38 %
Neutro Abs: 0 10*3/uL — CL (ref 1.7–7.7)
Neutrophils Relative %: 5 %
Platelets: 91 10*3/uL — ABNORMAL LOW (ref 150–400)
RBC: 2.98 MIL/uL — ABNORMAL LOW (ref 3.87–5.11)
RDW: 60.2 % — ABNORMAL HIGH (ref 11.5–15.5)
WBC: 0.4 10*3/uL — CL (ref 4.0–10.5)

## 2022-03-05 LAB — COMPREHENSIVE METABOLIC PANEL
ALT: 35 U/L (ref 0–44)
AST: 32 U/L (ref 15–41)
Albumin: 3.4 g/dL — ABNORMAL LOW (ref 3.5–5.0)
Alkaline Phosphatase: 50 U/L (ref 38–126)
Anion gap: 11 (ref 5–15)
BUN: 13 mg/dL (ref 6–20)
CO2: 22 mmol/L (ref 22–32)
Calcium: 8.6 mg/dL — ABNORMAL LOW (ref 8.9–10.3)
Chloride: 100 mmol/L (ref 98–111)
Creatinine, Ser: 0.83 mg/dL (ref 0.44–1.00)
GFR, Estimated: >60 mL/min (ref >60)
Glucose, Bld: 188 mg/dL — ABNORMAL HIGH (ref 70–99)
Potassium: 3.2 mmol/L — ABNORMAL LOW (ref 3.5–5.1)
Sodium: 133 mmol/L — ABNORMAL LOW (ref 135–145)
Total Bilirubin: 1.3 mg/dL — ABNORMAL HIGH (ref 0.3–1.2)
Total Protein: 6.2 g/dL — ABNORMAL LOW (ref 6.5–8.1)

## 2022-03-05 LAB — RESP PANEL BY RT-PCR (FLU A&B, COVID) ARPGX2
Influenza A by PCR: NEGATIVE
Influenza B by PCR: NEGATIVE
SARS Coronavirus 2 by RT PCR: NEGATIVE

## 2022-03-05 LAB — LACTIC ACID, PLASMA: Lactic Acid, Venous: 2.5 mmol/L (ref 0.5–1.9)

## 2022-03-05 MED ORDER — ACETAMINOPHEN 325 MG PO TABS: 650.0000 mg | ORAL_TABLET | Freq: Four times a day (QID) | ORAL | Status: DC | PRN

## 2022-03-05 MED ORDER — HEPARIN SODIUM (PORCINE) 5000 UNIT/ML IJ SOLN: 5000.0000 [IU] | Freq: Three times a day (TID) | INTRAMUSCULAR | Status: DC

## 2022-03-05 MED ORDER — MORPHINE SULFATE (PF) 2 MG/ML IV SOLN: 2.0000 mg | INTRAVENOUS | Status: DC | PRN

## 2022-03-05 MED ORDER — HYDROCODONE-ACETAMINOPHEN 5-325 MG PO TABS: 1.0000 | ORAL_TABLET | ORAL | Status: DC | PRN

## 2022-03-05 MED ORDER — LACTATED RINGERS IV BOLUS
1000.0000 mL | Freq: Once | INTRAVENOUS | Status: AC
  Administered 2022-03-05: 1000 mL via INTRAVENOUS

## 2022-03-05 MED ORDER — SODIUM CHLORIDE 0.9 % IV SOLN
2.0000 g | Freq: Once | INTRAVENOUS | Status: DC
  Filled 2022-03-05: qty 12.5

## 2022-03-05 MED ORDER — SODIUM CHLORIDE 0.9% FLUSH
3.0000 mL | Freq: Two times a day (BID) | INTRAVENOUS | Status: DC
  Administered 2022-03-06 – 2022-03-07 (×3): 3 mL via INTRAVENOUS

## 2022-03-05 MED ORDER — PANTOPRAZOLE SODIUM 40 MG IV SOLR
40.0000 mg | Freq: Two times a day (BID) | INTRAVENOUS | Status: DC
  Administered 2022-03-06 – 2022-03-07 (×3): 40 mg via INTRAVENOUS
  Filled 2022-03-05 (×3): qty 10

## 2022-03-05 MED ORDER — LEVOFLOXACIN 500 MG PO TABS: 500.0000 mg | ORAL_TABLET | Freq: Every day | ORAL | 0 refills | Status: DC

## 2022-03-05 MED ORDER — SODIUM CHLORIDE 0.9 % IV SOLN
2.0000 g | Freq: Three times a day (TID) | INTRAVENOUS | Status: DC
  Administered 2022-03-05 – 2022-03-07 (×5): 2 g via INTRAVENOUS
  Filled 2022-03-05 (×3): qty 12.5
  Filled 2022-03-05 (×2): qty 2

## 2022-03-05 MED ORDER — SODIUM CHLORIDE 0.9 % IV SOLN: INTRAVENOUS | Status: DC

## 2022-03-05 MED ORDER — ACETAMINOPHEN 650 MG RE SUPP: 650.0000 mg | Freq: Four times a day (QID) | RECTAL | Status: DC | PRN

## 2022-03-05 MED ORDER — VANCOMYCIN HCL IN DEXTROSE 1-5 GM/200ML-% IV SOLN
1000.0000 mg | Freq: Once | INTRAVENOUS | Status: AC
  Administered 2022-03-05: 1000 mg via INTRAVENOUS
  Filled 2022-03-05: qty 200

## 2022-03-05 NOTE — ED Triage Notes (Signed)
Pt in via POV, reports fever 102 at home, also feeling nauseous w/ generalized abdominal pain.    Patient just received last chemotherapy 02/26/22; currently being treated for breast cancer.

## 2022-03-05 NOTE — ED Provider Notes (Signed)
[facility] Provider Note    None    (approximate)   History   Fever   HPI  Marilyn Page is a 46 y.o. female who presented to the emergency department today because of concerns for fever.  Patient does have history of breast cancer and finished last round of chemotherapy roughly 1 week ago.  Today started feeling poorly.  Felt hot.  Had a measured temperature of 102.  Patient denied any chest pain or shortness of breath.  Denies any dysuria or bad odor to her urine.  Denies any known sick contacts.  Physical Exam   Triage Vital Signs: ED Triage Vitals  Enc Vitals Group     BP 03/05/22 2049 (!) 130/107     Pulse Rate 03/05/22 2049 (!) 129     Resp --      Temp 03/05/22 2049 98.7 F (37.1 C)     Temp Source 03/05/22 2049 Oral     SpO2 03/05/22 2049 96 %     Weight 03/05/22 2045 209 lb (94.8 kg)     Height 03/05/22 2045 '5\' 6"'$  (1.676 m)     Head Circumference --      Peak Flow --      Pain Score 03/05/22 2045 0     Pain Loc --      Pain Edu? --      Excl. in [place]? --     Most recent vital signs: Vitals:   03/05/22 2049  BP: (!) 130/107  Pulse: (!) 129  Temp: 98.7 F (37.1 C)  SpO2: 96%   General: Awake, alert, oriented. CV:  Good peripheral perfusion. Tachycardia. Resp:  Normal effort. Lungs clear. Abd:  No distention. Non tender.   ED Results / Procedures / Treatments   Labs (all labs ordered are listed, but only abnormal results are displayed) Labs Reviewed  LACTIC ACID, PLASMA - Abnormal; Notable for the following components:      Result Value   Lactic Acid, Venous 2.5 (*)    All other components within normal limits  COMPREHENSIVE METABOLIC PANEL - Abnormal; Notable for the following components:   Sodium 133 (*)    Potassium 3.2 (*)    Glucose, Bld 188 (*)    Calcium 8.6 (*)    Total Protein 6.2 (*)    Albumin 3.4 (*)    Total Bilirubin 1.3 (*)    All other components within normal limits  RESP PANEL BY RT-PCR (FLU A&B,  COVID) ARPGX2  LACTIC ACID, PLASMA  CBC WITH DIFFERENTIAL/PLATELET  URINALYSIS, ROUTINE W REFLEX MICROSCOPIC  POC URINE PREG, ED     EKG  None   RADIOLOGY I independently interpreted and visualized the CXR. My interpretation: No pneumonia. No pneumothorax. Radiology interpretation:  IMPRESSION:  1. No acute intrathoracic process.     PROCEDURES:  Critical Care performed: No  Procedures   MEDICATIONS ORDERED IN ED: Medications - No data to display   IMPRESSION / MDM / [place] / ED COURSE  I reviewed the triage vital signs and the nursing notes.                              Differential diagnosis includes, but is not limited to, pneumonia, UTI, covid.  Patient's presentation is most consistent with acute presentation with potential threat to life or bodily function.  Patient presented to the emergency department today because of concerns for fever.  Patient did recently have chemotherapy.  Patient was afebrile here in the emergency department.  Blood work however is concerning for neutropenia.  Given reported fever we will treat for neutropenic fever with vancomycin and cefepime.  Discussed with Dr. Posey Pronto with the hospitalist service will plan on admission.   FINAL CLINICAL IMPRESSION(S) / ED DIAGNOSES   Final diagnoses:  Neutropenic fever ([place])     Note:  This document was prepared using Dragon voice recognition software and may include unintentional dictation errors.    Nance Pear, MD 03/05/22 [phone]

## 2022-03-05 NOTE — Telephone Encounter (Signed)
Patient just called reporting that she has developed a fever of 102 at 330 today. She has taken a Tylenol for it. She had treatment last Friday. Her only other symptoms is that she has a headache which she thinks is coming from wearing a tight beanie cap to warm up from chills she got at noon today. She states she took a home COVID test and it was negative. She denies being around any sick people. Please advise

## 2022-03-05 NOTE — Progress Notes (Signed)
Patient called into clinic complaining of fever 102. Received chemo on 9/22 and GCSF on 9/25. Covid test at home was negative. Prescription for levaquin 500 mg/day x 7 days sent to pharmacy. ER for worsening symptoms. If covid test becomes positive, consider anti-viral. Patient has hx of hospitalization for neutropenic fever.

## 2022-03-05 NOTE — Telephone Encounter (Signed)
Call returned to patient and advised per verbal order Dr B that prescription for Levaquin 200 mg daily has been sent to [facility] in [place] for her and she needs to start it asap Also informed her that is she gets worse or does not get better that she will need to be seen in ER or Urgent Care center over weekend. She agrees to get prescription asap and to go to ER or Urgent if worsens or does not get better

## 2022-03-06 DIAGNOSIS — R509 Fever, unspecified: Secondary | ICD-10-CM | POA: Diagnosis not present

## 2022-03-06 DIAGNOSIS — E872 Acidosis, unspecified: Secondary | ICD-10-CM | POA: Diagnosis present

## 2022-03-06 LAB — COMPREHENSIVE METABOLIC PANEL
ALT: 24 U/L (ref 0–44)
AST: 17 U/L (ref 15–41)
Albumin: 2.8 g/dL — ABNORMAL LOW (ref 3.5–5.0)
Alkaline Phosphatase: 35 U/L — ABNORMAL LOW (ref 38–126)
Anion gap: 8 (ref 5–15)
BUN: 11 mg/dL (ref 6–20)
CO2: 24 mmol/L (ref 22–32)
Calcium: 7.8 mg/dL — ABNORMAL LOW (ref 8.9–10.3)
Chloride: 105 mmol/L (ref 98–111)
Creatinine, Ser: 0.6 mg/dL (ref 0.44–1.00)
GFR, Estimated: >60 mL/min (ref >60)
Glucose, Bld: 100 mg/dL — ABNORMAL HIGH (ref 70–99)
Potassium: 2.7 mmol/L — CL (ref 3.5–5.1)
Sodium: 137 mmol/L (ref 135–145)
Total Bilirubin: 0.9 mg/dL (ref 0.3–1.2)
Total Protein: 5.2 g/dL — ABNORMAL LOW (ref 6.5–8.1)

## 2022-03-06 LAB — LACTIC ACID, PLASMA: Lactic Acid, Venous: 2.7 mmol/L (ref 0.5–1.9)

## 2022-03-06 LAB — CBC
HCT: 22.5 % — ABNORMAL LOW (ref 36.0–46.0)
Hemoglobin: 7.5 g/dL — ABNORMAL LOW (ref 12.0–15.0)
MCH: 33.8 pg (ref 26.0–34.0)
MCHC: 33.3 g/dL (ref 30.0–36.0)
MCV: 101.4 fL — ABNORMAL HIGH (ref 80.0–100.0)
Platelets: 72 10*3/uL — ABNORMAL LOW (ref 150–400)
RBC: 2.22 MIL/uL — ABNORMAL LOW (ref 3.87–5.11)
RDW: 16.2 % — ABNORMAL HIGH (ref 11.5–15.5)
WBC: 2.1 10*3/uL — ABNORMAL LOW (ref 4.0–10.5)
nRBC: 0 % (ref 0.0–0.2)

## 2022-03-06 LAB — URINALYSIS, ROUTINE W REFLEX MICROSCOPIC
Bilirubin Urine: NEGATIVE
Glucose, UA: 50 mg/dL — AB
Hgb urine dipstick: NEGATIVE
Ketones, ur: NEGATIVE mg/dL
Leukocytes,Ua: NEGATIVE
Nitrite: NEGATIVE
Protein, ur: NEGATIVE mg/dL
Specific Gravity, Urine: 1.006 (ref 1.005–1.030)
pH: 8 (ref 5.0–8.0)

## 2022-03-06 MED ORDER — ONDANSETRON HCL 4 MG/2ML IJ SOLN: 4.0000 mg | Freq: Four times a day (QID) | INTRAMUSCULAR | Status: DC | PRN

## 2022-03-06 MED ORDER — POTASSIUM CHLORIDE 10 MEQ/100ML IV SOLN
10.0000 meq | INTRAVENOUS | Status: AC
  Administered 2022-03-06 – 2022-03-07 (×6): 10 meq via INTRAVENOUS
  Filled 2022-03-06 (×5): qty 100

## 2022-03-06 MED ORDER — ONDANSETRON HCL 4 MG/2ML IJ SOLN
4.0000 mg | Freq: Once | INTRAMUSCULAR | Status: AC
  Administered 2022-03-06: 4 mg via INTRAVENOUS
  Filled 2022-03-06: qty 2

## 2022-03-06 MED ORDER — CHLORHEXIDINE GLUCONATE CLOTH 2 % EX PADS
6.0000 | MEDICATED_PAD | Freq: Every day | CUTANEOUS | Status: DC
  Administered 2022-03-06: 6 via TOPICAL

## 2022-03-06 MED ORDER — VANCOMYCIN HCL 750 MG/150ML IV SOLN
750.0000 mg | Freq: Two times a day (BID) | INTRAVENOUS | Status: DC
  Administered 2022-03-06 – 2022-03-07 (×2): 750 mg via INTRAVENOUS
  Filled 2022-03-06 (×3): qty 150

## 2022-03-06 MED ORDER — POTASSIUM CHLORIDE 20 MEQ PO PACK
40.0000 meq | PACK | Freq: Once | ORAL | Status: AC
  Administered 2022-03-06: 40 meq via ORAL
  Filled 2022-03-06: qty 2

## 2022-03-06 NOTE — Assessment & Plan Note (Signed)
Attribute to febrile neutropenia. Prn tylenol.

## 2022-03-06 NOTE — H&P (Signed)
History and Physical    Chief Complaint: N/V   HISTORY OF PRESENT ILLNESS: Marilyn Page is an 46 y.o. female  seen for Pt has been having fever that started today about lunch time- 102. Pt felt worse as day went by and pcp sent levaquin and so she came to ed.  Pt has had this in past and had covid then and today her home covid test is negative.  N/V - today/ abdominal pain around the umbilicus and is intermittent and it has resolved.  Fever. Headache. Pt uses only tylenol. Pt has had admission for similar issue  in July 2023. Ros is otherwise negative.  Is currently receiving chemo for her breast cancer.     Pt has PMH as below: Past Medical History:  Diagnosis Date   Carcinoma of upper-outer quadrant of right breast in female, estrogen receptor positive ([place]) 10/26/2021   Diabetes mellitus without complication ([place])    History of gestational diabetes    Personal history of chemotherapy     Review Of Systems: Review of Systems  Constitutional:  Positive for chills and fever.  Gastrointestinal:  Positive for nausea and vomiting.     ALLERGIES: Allergies  Allergen Reactions   Metformin Other (See Comments)    PAST SURGICAL HISTORY: Past Surgical History:  Procedure Laterality Date   BREAST BIOPSY Right 10/19/2021   [place] Core bx 10:00 6 cmfn Ribbon Clip-INVASIVE MAMMARY CARCINOMA,. Axilla Butterfly hydro clip-MACRO METASTATIC MAMMARY CARCINOMA   CESAREAN SECTION  2005/2008   DILATION AND CURETTAGE OF UTERUS  2003   PORTACATH PLACEMENT Left 11/09/2021   Procedure: INSERTION PORT-A-CATH;  Surgeon: Robert Bellow, MD;  Location: [facility] ORS;  Service: General;  Laterality: Left;   SKIN BIOPSY Right 11/09/2021   Procedure: PUNCH BIOPSY SKIN, TATTOO LYMPH NODE RIGHT AXILLA;  Surgeon: Robert Bellow, MD;  Location: [facility] ORS;  Service: General;  Laterality: Right;     SOCIAL HISTORY: Social History   Socioeconomic History   Marital status: Married    Spouse  name: Jaci Standard   Number of children: 2   Years of education: Not on file   Highest education level: Not on file  Occupational History   Occupation: and Optometrist  Tobacco Use   Smoking status: Never   Smokeless tobacco: Never  Vaping Use   Vaping Use: Never used  Substance and Sexual Activity   Alcohol use: Never   Drug use: Never   Sexual activity: Yes    Birth control/protection: Other-see comments, Surgical    Comment: vasectomy  Other Topics Concern   Not on file  Social History Narrative   Pre-school teacher; in [place]; 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    Social Determinants of Health   Financial Resource Strain: Not on file  Food Insecurity: Not on file  Transportation Needs: Not on file  Physical Activity: Inactive (09/26/2017)   Exercise Vital Sign    Days of Exercise per Week: 0 days    Minutes of Exercise per Session: 0 min  Stress: Not on file  Social Connections: Not on file      CURRENT MEDS:   Current Outpatient Medications (Endocrine & Metabolic):    norethindrone (MICRONOR) 0.35 MG tablet, Take 1 tablet (0.35 mg total) by mouth daily.   Semaglutide, 1 MG/DOSE, 4 MG/3ML SOPN, Inject into the skin. Inject 1 mg (0.75 ml) subcutaneously once a week.   dexamethasone (DECADRON) 4 MG tablet, One pill twice a day - Start the day prior to chemo;  do not take the day of chemo. And take for 2 more days after the chemo. (Patient not taking: Reported on 03/05/2022)     Current Outpatient Medications (Respiratory):    Ipratropium-Albuterol (COMBIVENT) 20-100 MCG/ACT AERS respimat, Inhale 1 puff into the lungs every 6 (six) hours. (Patient taking differently: Inhale 1 puff into the lungs every 6 (six) hours as needed.)   magic mouthwash w/lidocaine SOLN*, Take 5 mLs by mouth 4 (four) times daily as needed for mouth pain. Sig: Swish/Swallow 5-10 ml four times a day as needed. Dispense 480 ml. 1RF   guaiFENesin (MUCINEX) 600 MG 12 hr tablet, Take 2 tablets (1,200 mg total)  by mouth 2 (two) times daily. (Patient not taking: Reported on 03/05/2022)  Current Facility-Administered Medications (Analgesics):    acetaminophen (TYLENOL) tablet 650 mg **OR** acetaminophen (TYLENOL) suppository 650 mg   HYDROcodone-acetaminophen (NORCO/VICODIN) 5-325 MG per tablet 1 tablet   morphine (PF) 2 MG/ML injection 2 mg  Current Outpatient Medications (Analgesics):    acetaminophen (TYLENOL) 325 MG tablet, Take 2 tablets (650 mg total) by mouth every 6 (six) hours as needed for mild pain (or Fever >/= 101).   Current Outpatient Medications (Hematological):    Cyanocobalamin (VITAMIN B-12 PO), Take 1 tablet by mouth daily.   ferrous sulfate 325 (65 FE) MG tablet, Take 325 mg by mouth 2 (two) times daily with a meal.  Current Facility-Administered Medications (Other):    0.9 %  sodium chloride infusion   ceFEPIme (MAXIPIME) 2 g in sodium chloride 0.9 % 100 mL IVPB   ondansetron (ZOFRAN) injection 4 mg   pantoprazole (PROTONIX) injection 40 mg   sodium chloride flush (NS) 0.9 % injection 3 mL   vancomycin (VANCOREADY) IVPB 750 mg/150 mL  Current Outpatient Medications (Other):    alum & mag hydroxide-simeth (MAALOX/MYLANTA) 782-423-53 MG/5ML suspension, Take 30 mLs by mouth every 4 (four) hours as needed for indigestion.   ascorbic acid (VITAMIN C) 500 MG tablet, Take 1 tablet (500 mg total) by mouth daily.   calcium-vitamin D (OSCAL WITH D) 500-5 MG-MCG tablet, Take 1 tablet by mouth 2 (two) times daily.   levofloxacin (LEVAQUIN) 500 MG tablet, Take 1 tablet (500 mg total) by mouth daily.   lidocaine-prilocaine (EMLA) cream, Apply on the port. 30 -45 min  prior to port access.   loperamide (IMODIUM) 2 MG capsule, Take 1 capsule (2 mg total) by mouth as needed for diarrhea or loose stools.   magic mouthwash w/lidocaine SOLN*, Take 5 mLs by mouth 4 (four) times daily as needed for mouth pain. Sig: Swish/Swallow 5-10 ml four times a day as needed. Dispense 480 ml. 1RF   Multiple  Vitamin (MULTIVITAMIN WITH MINERALS) TABS tablet, Take 1 tablet by mouth daily.   erythromycin ophthalmic ointment, Place 1 Application into both eyes 2 (two) times daily. (Patient not taking: Reported on 03/05/2022)   feeding supplement (ENSURE ENLIVE / ENSURE PLUS) LIQD, Take 237 mLs by mouth 3 (three) times daily between meals. (Patient not taking: Reported on 03/05/2022)   ONETOUCH VERIO test strip, SMARTSIG:1 Each Via Meter Daily PRN   phenol (CHLORASEPTIC) 1.4 % LIQD, Use as directed 1 spray in the mouth or throat as needed for throat irritation / pain. (Patient not taking: Reported on 03/05/2022)   prochlorperazine (COMPAZINE) 10 MG tablet, Take 1 tablet (10 mg total) by mouth every 6 (six) hours as needed for nausea or vomiting.   zinc sulfate 220 (50 Zn) MG capsule, Take 1 capsule (220 mg total)  by mouth daily. (Patient not taking: Reported on 03/05/2022) * These medications belong to multiple therapeutic classes and are listed under each applicable group.    ED Course: Pt in Ed is alert and oriented and stable. D/w her that probifer fofi sdssue  Vitals:   03/06/22 0045 03/06/22 0115 03/06/22 0129 03/06/22 0157  BP:   (!) 98/46 (!) 100/54  Pulse: 100 (!) 101 (!) 102 (!) 105  Resp:   16 16  Temp:      TempSrc:      SpO2: 100% 97% 100% 100%  Weight:      Height:       No intake/output data recorded. SpO2: 100 % Blood work in ed shows  Results for orders placed or performed during the hospital encounter of 03/05/22 (from the past 24 hour(s))  Lactic acid, plasma     Status: Abnormal   Collection Time: 03/05/22  9:05 PM  Result Value Ref Range   Lactic Acid, Venous 2.5 (HH) 0.5 - 1.9 mmol/L  Comprehensive metabolic panel     Status: Abnormal   Collection Time: 03/05/22  9:05 PM  Result Value Ref Range   Sodium 133 (L) 135 - 145 mmol/L   Potassium 3.2 (L) 3.5 - 5.1 mmol/L   Chloride 100 98 - 111 mmol/L   CO2 22 22 - 32 mmol/L   Glucose, Bld 188 (H) 70 - 99 mg/dL   BUN 13 6 -  20 mg/dL   Creatinine, Ser 0.83 0.44 - 1.00 mg/dL   Calcium 8.6 (L) 8.9 - 10.3 mg/dL   Total Protein 6.2 (L) 6.5 - 8.1 g/dL   Albumin 3.4 (L) 3.5 - 5.0 g/dL   AST 32 15 - 41 U/L   ALT 35 0 - 44 U/L   Alkaline Phosphatase 50 38 - 126 U/L   Total Bilirubin 1.3 (H) 0.3 - 1.2 mg/dL   GFR, Estimated >60 >60 mL/min   Anion gap 11 5 - 15  CBC with Differential     Status: Abnormal   Collection Time: 03/05/22  9:05 PM  Result Value Ref Range   WBC 0.4 (LL) 4.0 - 10.5 K/uL   RBC 2.98 (L) 3.87 - 5.11 MIL/uL   Hemoglobin 9.5 (L) 12.0 - 15.0 g/dL   HCT 29.1 (L) 36.0 - 46.0 %   MCV 100.1 (H) 80.0 - 100.0 fL   MCH 32.9 26.0 - 34.0 pg   MCHC 32.6 30.0 - 36.0 g/dL   RDW 60.2 (H) 11.5 - 15.5 %   Platelets 91 (L) 150 - 400 K/uL   Neutrophils Relative % 5 %   Neutro Abs 0.0 (LL) 1.7 - 7.7 K/uL   Lymphocytes Relative 58 %   Lymphs Abs 0.2 (L) 0.7 - 4.0 K/uL   Monocytes Relative 38 %   Monocytes Absolute 0.2 0.1 - 1.0 K/uL   Eosinophils Relative 0 %   Eosinophils Absolute 0.0 0.0 - 0.5 K/uL   Basophils Relative 0 %   Basophils Absolute 0.0 0.0 - 0.1 K/uL  Resp Panel by RT-PCR (Flu A&B, Covid) Anterior Nasal Swab     Status: None   Collection Time: 03/05/22 10:02 PM   Specimen: Anterior Nasal Swab  Result Value Ref Range   SARS Coronavirus 2 by RT PCR NEGATIVE NEGATIVE   Influenza A by PCR NEGATIVE NEGATIVE   Influenza B by PCR NEGATIVE NEGATIVE   In Ed pt received  Meds ordered this encounter  Medications   DISCONTD: ceFEPIme (MAXIPIME) 2 g in  sodium chloride 0.9 % 100 mL IVPB    Order Specific Question:   Antibiotic Indication:    Answer:   Other Indication (list below)   vancomycin (VANCOCIN) IVPB 1000 mg/200 mL premix    Order Specific Question:   Indication:    Answer:   Other Indication (list below)   lactated ringers bolus 1,000 mL   ceFEPIme (MAXIPIME) 2 g in sodium chloride 0.9 % 100 mL IVPB    Order Specific Question:   Antibiotic Indication:    Answer:   Febrile Neutropenia    DISCONTD: heparin injection 5,000 Units   sodium chloride flush (NS) 0.9 % injection 3 mL   0.9 %  sodium chloride infusion   OR Linked Order Group    acetaminophen (TYLENOL) tablet 650 mg    acetaminophen (TYLENOL) suppository 650 mg   HYDROcodone-acetaminophen (NORCO/VICODIN) 5-325 MG per tablet 1 tablet   morphine (PF) 2 MG/ML injection 2 mg   pantoprazole (PROTONIX) injection 40 mg   lactated ringers bolus 1,000 mL   vancomycin (VANCOCIN) IVPB 1000 mg/200 mL premix    Order Specific Question:   Indication:    Answer:   Febrile Neutropenia   vancomycin (VANCOREADY) IVPB 750 mg/150 mL    Order Specific Question:   Indication:    Answer:   Febrile Neutropenia   ondansetron (ZOFRAN) injection 4 mg    Unresulted Labs (From admission, onward)     Start     Ordered   03/06/22 0500  Comprehensive metabolic panel  Tomorrow morning,   STAT        03/05/22 2238   03/06/22 0500  CBC  Tomorrow morning,   STAT        03/05/22 2238   03/05/22 2238  C Difficile Quick Screen w PCR reflex  (C Difficile quick screen w PCR reflex panel )  Once, for 24 hours,   URGENT       References:    CDiff Information Tool   03/05/22 2238   03/05/22 2216  Blood culture (routine x 2)  BLOOD CULTURE X 2,   STAT      03/05/22 2216   03/05/22 2108  Lactic acid, plasma  Now then every 2 hours,   STAT      03/05/22 2108   03/05/22 2108  Urinalysis, Routine w reflex microscopic  Once,   URGENT        03/05/22 2108             Admission Imaging : DG Chest 2 View  Result Date: 03/05/2022 CLINICAL DATA:  Fever, breast cancer EXAM: CHEST - 2 VIEW COMPARISON:  12/27/2021 FINDINGS: Frontal and lateral views of the chest demonstrates stable left chest wall port. Cardiac silhouette is unremarkable. No acute airspace disease, effusion, or pneumothorax. No acute bony abnormalities. IMPRESSION: 1. No acute intrathoracic process. Electronically Signed   By: Randa Ngo M.D.   On: 03/05/2022 21:30       Physical Examination: Vitals:   03/06/22 0045 03/06/22 0115 03/06/22 0129 03/06/22 0157  BP:   (!) 98/46 (!) 100/54  Pulse: 100 (!) 101 (!) 102 (!) 105  Temp:      Resp:   16 16  Height:      Weight:      SpO2: 100% 97% 100% 100%  TempSrc:      BMI (Calculated):       Physical Exam Vitals and nursing note reviewed.  Constitutional:      General: She is  not in acute distress.    Appearance: Normal appearance. She is not ill-appearing, toxic-appearing or diaphoretic.  HENT:     Head: Normocephalic and atraumatic.     Right Ear: Hearing and external ear normal.     Left Ear: Hearing and external ear normal.     Nose: Nose normal. No nasal deformity.     Mouth/Throat:     Lips: Pink.     Mouth: Mucous membranes are moist.     Tongue: No lesions.     Pharynx: Oropharynx is clear.  Eyes:     Extraocular Movements: Extraocular movements intact.     Pupils: Pupils are equal, round, and reactive to light.  Neck:     Vascular: No carotid bruit.  Cardiovascular:     Rate and Rhythm: Normal rate and regular rhythm.     Pulses: Normal pulses.     Heart sounds: Normal heart sounds.  Pulmonary:     Effort: Pulmonary effort is normal.     Breath sounds: Normal breath sounds.  Abdominal:     General: Bowel sounds are normal. There is no distension.     Palpations: Abdomen is soft. There is no mass.     Tenderness: There is no abdominal tenderness. There is no guarding.     Hernia: No hernia is present.  Musculoskeletal:     Right lower leg: No edema.     Left lower leg: No edema.  Skin:    General: Skin is warm.  Neurological:     General: No focal deficit present.     Mental Status: She is alert and oriented to person, place, and time.     Cranial Nerves: Cranial nerves 2-12 are intact.     Motor: Motor function is intact.  Psychiatric:        Attention and Perception: Attention normal.        Mood and Affect: Mood normal.        Speech: Speech normal.         Behavior: Behavior normal. Behavior is cooperative.        Cognition and Memory: Cognition normal.        Assessment and Plan: * Fever and chills Attribute to febrile neutropenia. Prn tylenol.     Febrile neutropenia (HCC) Pt has neutrophil count of 0 and is at hight risk for infections. Will defer to am team to get oncology consult and option for patient with GCF.    Diabetes mellitus ([place]) Pt is on semaglutide.  Glycemic protocol.     Lactic acid acidosis No intake/output data recorded. No intake/output data recorded. Strict I/o. Attribute to hypotension.  Cont mivf.   Thrombocytopenia ([place])    Latest Ref Rng & Units 03/05/2022    9:05 PM 02/26/2022    8:23 AM 02/12/2022    9:10 AM  CBC  WBC 4.0 - 10.5 K/uL 0.4  6.9  5.0   Hemoglobin 12.0 - 15.0 g/dL 9.5  9.7  9.6   Hematocrit 36.0 - 46.0 % 29.1  29.2  29.6   Platelets 150 - 400 K/uL 91  160  84    Intermittent thrombocytopenia.     DVT prophylaxis:  SCD's   Code Status:  Full code    Family Communication:  MADINA, GALATI (Spouse)  [phone] (Home Phone)   Disposition Plan:  Home    Consults called:  None   Admission status: Observation .   Unit/ Expected LOS: Med tele.   Para Skeans  MD [facility]  6 PM- 2 AM. Please contact me via secure Chat 6 PM-2 AM. [phone] ( Pager ) To contact the [facility] Attending or Consulting provider [place] or covering provider during after hours [place], for this patient.   Check the care team in [facility] and look for a) attending/consulting [facility] provider listed and b) the [facility] team listed Log into [email or web address] and use [place]'s universal password to access. If you do not have the password, please contact the hospital operator. Locate the [facility] provider you are looking for under [facility] and page to a number that you can be directly reached. If you still have difficulty reaching the provider, please page the [facility] (Director on Call) for the  Hospitalists listed on amion for assistance. [email or web address] 03/06/2022, 2:04 AM

## 2022-03-06 NOTE — Assessment & Plan Note (Signed)
Pt is on semaglutide.  Glycemic protocol.

## 2022-03-06 NOTE — Assessment & Plan Note (Signed)
No intake/output data recorded. No intake/output data recorded. Strict I/o. Attribute to hypotension.  Cont mivf.

## 2022-03-06 NOTE — Assessment & Plan Note (Signed)
Pt has neutrophil count of 0 and is at hight risk for infections. Will defer to am team to get oncology consult and option for patient with [facility]CF.

## 2022-03-06 NOTE — ED Notes (Signed)
Informed RN bed assigned 

## 2022-03-06 NOTE — Progress Notes (Signed)
Pharmacy Antibiotic Note  Marilyn Page is a 46 y.o. female admitted on 03/05/2022 with  febrile neutropenia .  Pharmacy has been consulted for Vancomycin dosing.  Plan: Vancomycin 1 gm IV X 1 given in ED on 9/29 @ 2245.  Additional Vanc 1 gm IV X 1 given @ 8921 to make total loading dose of 2 gm.  Vancomycin 750 mg IV Q12H ordered to start on 9/30 @ 2300.   AUC = 455.3 Vanc trough = 13   Height: '5\' 6"'$  (167.6 cm) Weight: 94.8 kg (209 lb) IBW/kg (Calculated) : 59.3  Temp (24hrs), Avg:99 F (37.2 C), Min:98.7 F (37.1 C), Max:99.3 F (37.4 C)  Recent Labs  Lab 03/05/22 2105  WBC 0.4*  CREATININE 0.83  LATICACIDVEN 2.5*    Estimated Creatinine Clearance: 97.2 mL/min (by C-G formula based on SCr of 0.83 mg/dL).    Allergies  Allergen Reactions   Metformin Other (See Comments)    Antimicrobials this admission:   >>    >>   Dose adjustments this admission:   Microbiology results:  BCx:   UCx:    Sputum:    MRSA PCR:   Thank you for allowing pharmacy to be a part of this patient's care.  Anja Neuzil D 03/06/2022 1:00 AM

## 2022-03-06 NOTE — Assessment & Plan Note (Signed)
    Latest Ref Rng & Units 03/05/2022    9:05 PM 02/26/2022    8:23 AM 02/12/2022    9:10 AM  CBC  WBC 4.0 - 10.5 K/uL 0.4  6.9  5.0   Hemoglobin 12.0 - 15.0 g/dL 9.5  9.7  9.6   Hematocrit 36.0 - 46.0 % 29.1  29.2  29.6   Platelets 150 - 400 K/uL 91  160  84    Intermittent thrombocytopenia.

## 2022-03-06 NOTE — Care Plan (Signed)
This 46 years old female with PMH significant for right breast carcinoma estrogen receptor positive completed chemotherapy last week, type 2 diabetes presented in the ED with complaints of high-grade fever associated with nausea.  Her PCP has started her on Levaquin but she has not started yet.  She had similar presentation in the past and at that time she was diagnosed with COVID.  This time COVID test is negative.  Patient is admitted for neutropenic fever started on IV antibiotics.  Case discussed with oncologist Dr. Tasia Catchings, who recommended to continue antibiotics and IV hydration.  Patient was seen and examined at bedside all questions answered.  She reports feeling better

## 2022-03-07 DIAGNOSIS — R5081 Fever presenting with conditions classified elsewhere: Secondary | ICD-10-CM | POA: Diagnosis not present

## 2022-03-07 DIAGNOSIS — D696 Thrombocytopenia, unspecified: Secondary | ICD-10-CM

## 2022-03-07 DIAGNOSIS — D709 Neutropenia, unspecified: Secondary | ICD-10-CM | POA: Diagnosis not present

## 2022-03-07 DIAGNOSIS — R509 Fever, unspecified: Secondary | ICD-10-CM | POA: Diagnosis not present

## 2022-03-07 LAB — CBC WITH DIFFERENTIAL/PLATELET
Abs Immature Granulocytes: 0.05 10*3/uL (ref 0.00–0.07)
Basophils Absolute: 0.1 10*3/uL (ref 0.0–0.1)
Basophils Relative: 2 %
Eosinophils Absolute: 0 10*3/uL (ref 0.0–0.5)
Eosinophils Relative: 0 %
HCT: 24.3 % — ABNORMAL LOW (ref 36.0–46.0)
Hemoglobin: 7.9 g/dL — ABNORMAL LOW (ref 12.0–15.0)
Immature Granulocytes: 1 %
Lymphocytes Relative: 21 %
Lymphs Abs: 0.8 10*3/uL (ref 0.7–4.0)
MCH: 33.3 pg (ref 26.0–34.0)
MCHC: 32.5 g/dL (ref 30.0–36.0)
MCV: 102.5 fL — ABNORMAL HIGH (ref 80.0–100.0)
Monocytes Absolute: 0.7 10*3/uL (ref 0.1–1.0)
Monocytes Relative: 18 %
Neutro Abs: 2.3 10*3/uL (ref 1.7–7.7)
Neutrophils Relative %: 58 %
Platelets: 80 10*3/uL — ABNORMAL LOW (ref 150–400)
RBC: 2.37 MIL/uL — ABNORMAL LOW (ref 3.87–5.11)
RDW: 16.5 % — ABNORMAL HIGH (ref 11.5–15.5)
Smear Review: NORMAL
WBC Morphology: INCREASED
WBC: 3.9 10*3/uL — ABNORMAL LOW (ref 4.0–10.5)
nRBC: 0 % (ref 0.0–0.2)

## 2022-03-07 LAB — LACTIC ACID, PLASMA: Lactic Acid, Venous: 2 mmol/L (ref 0.5–1.9)

## 2022-03-07 LAB — POTASSIUM: Potassium: 3.1 mmol/L — ABNORMAL LOW (ref 3.5–5.1)

## 2022-03-07 LAB — HEMOGLOBIN AND HEMATOCRIT, BLOOD
HCT: 24 % — ABNORMAL LOW (ref 36.0–46.0)
Hemoglobin: 7.9 g/dL — ABNORMAL LOW (ref 12.0–15.0)

## 2022-03-07 MED ORDER — POTASSIUM CHLORIDE 20 MEQ PO PACK
40.0000 meq | PACK | Freq: Once | ORAL | Status: AC
  Administered 2022-03-07: 40 meq via ORAL
  Filled 2022-03-07: qty 2

## 2022-03-07 MED ORDER — POTASSIUM CHLORIDE 10 MEQ/100ML IV SOLN: 10.0000 meq | INTRAVENOUS | Status: DC

## 2022-03-07 MED ORDER — ORAL CARE MOUTH RINSE: 15.0000 mL | OROMUCOSAL | Status: DC | PRN

## 2022-03-07 MED ORDER — HEPARIN SOD (PORK) LOCK FLUSH 100 UNIT/ML IV SOLN
500.0000 [IU] | Freq: Once | INTRAVENOUS | Status: AC
  Administered 2022-03-07: 500 [IU] via INTRAVENOUS
  Filled 2022-03-07 (×2): qty 5

## 2022-03-07 MED ORDER — VANCOMYCIN HCL 1750 MG/350ML IV SOLN: 1750.0000 mg | INTRAVENOUS | Status: DC

## 2022-03-07 NOTE — Plan of Care (Signed)

## 2022-03-07 NOTE — Discharge Instructions (Signed)
Advised to follow-up with primary care physician in 1 week. Patient has appointment Dr. Rogue Bussing tomorrow.

## 2022-03-07 NOTE — Progress Notes (Signed)
MD order received in [facility] to discharge pt home today; verbally reviewed AVS with pt, no new Rxs; verbally instructed pt to drink lots of fluids to drive her lactic (lab) down; no questions voiced at this time; pt discharged via wheelchair by nursing to the [place] entrance

## 2022-03-07 NOTE — Discharge Summary (Signed)
Physician Discharge Summary  MIRINDA MONTE WLN:[id number] DOB: 28-Apr-1976 DOA: 03/05/2022  PCP: Latanya Maudlin, NP  Admit date: 03/05/2022  Discharge date: 03/07/2022  Admitted From: Home. Disposition:  Home  Recommendations for Outpatient Follow-up:  Follow up with PCP in 1-2 weeks. Please obtain BMP/CBC in one week. She has appointment with Dr. Rogue Bussing tomorrow.  Home Health:None Equipment/Devices:None  Discharge Condition: Stable CODE STATUS:Full code Diet recommendation: Heart Healthy  Brief [facility] Course: This 46 years old female with PMH significant for right breast carcinoma,  estrogen receptor positive who has completed chemotherapy last week, type 2 diabetes presented in the ED with complaints of high-grade fever associated with nausea.  Her PCP has started her on Levaquin but she has not started yet.  She had similar presentation in the past and at that time she was diagnosed with COVID.  This time COVID test is negative.  Patient was admitted for neutropenic fever started on IV antibiotics.  Blood cultures were sent.  Case discussed with oncologist Dr. Tasia Catchings, who recommended to continue antibiotics and IV hydration.  Patient remains afebrile since hospitalized.  Blood cultures no growth in 48 hours.  Patient feels significantly improved.  ANC has completely resolved.  Patient has received empiric antibiotic vancomycin and cefepime for 2 days.  Chest x-ray negative, UA negative.  Discussed with oncologist Dr. Tasia Catchings, who has recommended patient can be discharged with no antibiotics.  Patient has appointment with Dr. Rogue Bussing tomorrow.  Patient is being discharged home.  Discharge Diagnoses:  Principal Problem:   Fever and chills Active Problems:   Febrile neutropenia (HCC)   Diabetes mellitus (HCC)   Thrombocytopenia (HCC)   Lactic acid acidosis  Discharge Instructions: Advised to follow-up with Dr. Rogue Bussing tomorrow  Discharge Instructions     Call MD  for:  difficulty breathing, headache or visual disturbances   Complete by: As directed    Call MD for:  persistant dizziness or light-headedness   Complete by: As directed    Call MD for:  persistant nausea and vomiting   Complete by: As directed    Diet - low sodium heart healthy   Complete by: As directed    Diet Carb Modified   Complete by: As directed    Discharge instructions   Complete by: As directed    Advised to follow-up with primary care physician in 1 week. Advised to follow-up with oncology. she has appointment tomorrow with Dr. Rogue Bussing   Increase activity slowly   Complete by: As directed       Allergies as of 03/07/2022       Reactions   Metformin Other (See Comments)        Medication List     STOP taking these medications    dexamethasone 4 MG tablet Commonly known as: DECADRON   erythromycin ophthalmic ointment   feeding supplement Liqd   guaiFENesin 600 MG 12 hr tablet Commonly known as: MUCINEX   levofloxacin 500 MG tablet Commonly known as: Levaquin   phenol 1.4 % Liqd Commonly known as: CHLORASEPTIC   zinc sulfate 220 (50 Zn) MG capsule       TAKE these medications    acetaminophen 325 MG tablet Commonly known as: TYLENOL Take 2 tablets (650 mg total) by mouth every 6 (six) hours as needed for mild pain (or Fever >/= 101).   alum & mag hydroxide-simeth 200-200-20 MG/5ML suspension Commonly known as: MAALOX/MYLANTA Take 30 mLs by mouth every 4 (four) hours as needed for indigestion.  ascorbic acid 500 MG tablet Commonly known as: VITAMIN C Take 1 tablet (500 mg total) by mouth daily.   calcium-vitamin D 500-5 MG-MCG tablet Commonly known as: OSCAL WITH D Take 1 tablet by mouth 2 (two) times daily.   ferrous sulfate 325 (65 FE) MG tablet Take 325 mg by mouth 2 (two) times daily with a meal.   Ipratropium-Albuterol 20-100 MCG/ACT Aers respimat Commonly known as: COMBIVENT Inhale 1 puff into the lungs every 6 (six)  hours. What changed:  when to take this reasons to take this   lidocaine-prilocaine cream Commonly known as: EMLA Apply on the port. 30 -45 min  prior to port access.   loperamide 2 MG capsule Commonly known as: IMODIUM Take 1 capsule (2 mg total) by mouth as needed for diarrhea or loose stools.   magic mouthwash w/lidocaine Soln Take 5 mLs by mouth 4 (four) times daily as needed for mouth pain. Sig: Swish/Swallow 5-10 ml four times a day as needed. Dispense 480 ml. 1RF   multivitamin with minerals Tabs tablet Take 1 tablet by mouth daily.   norethindrone 0.35 MG tablet Commonly known as: MICRONOR Take 1 tablet (0.35 mg total) by mouth daily.   OneTouch Verio test strip Generic drug: glucose blood SMARTSIG:1 Each Via Meter Daily PRN   prochlorperazine 10 MG tablet Commonly known as: COMPAZINE Take 1 tablet (10 mg total) by mouth every 6 (six) hours as needed for nausea or vomiting.   Semaglutide (1 MG/DOSE) 4 MG/3ML Sopn Inject into the skin. Inject 1 mg (0.75 ml) subcutaneously once a week.   VITAMIN B-12 PO Take 1 tablet by mouth daily.        Follow-up Information     Latanya Maudlin, NP Follow up in 1 week(s).   Specialty: Family Medicine Why: OFFICE CLOSED AT THIS TIME PATIENT TO MAKE OWN FOLLOW UP APPT Contact information: [place] [address] [phone]         Cammie Sickle, MD Follow up in 1 day(s).   Specialties: Internal Medicine, Oncology Why: OFFICE CLOSED AT THIS TIME PATIENT TO MAKE OWN FOLLOW UP APPT Contact information: [place] [address] [phone]                Allergies  Allergen Reactions   Metformin Other (See Comments)    Consultations: Oncology   Procedures/Studies: DG Chest 2 View  Result Date: 03/05/2022 CLINICAL DATA:  Fever, breast cancer EXAM: CHEST - 2 VIEW COMPARISON:  12/27/2021 FINDINGS: Frontal and lateral views of the chest demonstrates stable  left chest wall port. Cardiac silhouette is unremarkable. No acute airspace disease, effusion, or pneumothorax. No acute bony abnormalities. IMPRESSION: 1. No acute intrathoracic process. Electronically Signed   By: Randa Ngo M.D.   On: 03/05/2022 21:30    Subjective: Patient was seen and examined at bedside.  Overnight events noted.   Patient reports feeling much improved.  Wants to be discharged.  Discharge Exam: Vitals:   03/07/22 0905 03/07/22 1148  BP: 110/68 (!) 102/51  Pulse: 98 98  Resp: 17 17  Temp: 99.1 F (37.3 C) 98.6 F (37 C)  SpO2: 98% 100%   Vitals:   03/07/22 0312 03/07/22 0511 03/07/22 0905 03/07/22 1148  BP:  (!) 111/56 110/68 (!) 102/51  Pulse:  97 98 98  Resp:  '20 17 17  '$ Temp:  98 F (36.7 C) 99.1 F (37.3 C) 98.6 F (37 C)  TempSrc:  SpO2:  99% 98% 100%  Weight: 97.4 kg     Height:        General: Pt is alert, awake, not in acute distress Cardiovascular: RRR, S1/S2 +, no rubs, no gallops Respiratory: CTA bilaterally, no wheezing, no rhonchi Abdominal: Soft, NT, ND, bowel sounds + Extremities: no edema, no cyanosis    The results of significant diagnostics from this hospitalization (including imaging, microbiology, ancillary and laboratory) are listed below for reference.     Microbiology: Recent Results (from the past 240 hour(s))  Resp Panel by RT-PCR (Flu A&B, Covid) Anterior Nasal Swab     Status: None   Collection Time: 03/05/22 10:02 PM   Specimen: Anterior Nasal Swab  Result Value Ref Range Status   SARS Coronavirus 2 by RT PCR NEGATIVE NEGATIVE Final    Comment: (NOTE) SARS-CoV-2 target nucleic acids are NOT DETECTED.  The SARS-CoV-2 RNA is generally detectable in upper respiratory specimens during the acute phase of infection. The lowest concentration of SARS-CoV-2 viral copies this assay can detect is 138 copies/mL. A negative result does not preclude SARS-Cov-2 infection and should not be used as the sole basis for  treatment or other patient management decisions. A negative result may occur with  improper specimen collection/handling, submission of specimen other than nasopharyngeal swab, presence of viral mutation(s) within the areas targeted by this assay, and inadequate number of viral copies(<138 copies/mL). A negative result must be combined with clinical observations, patient history, and epidemiological information. The expected result is Negative.  Fact Sheet for Patients:  EntrepreneurPulse.com.au  Fact Sheet for Healthcare Providers:  IncredibleEmployment.be  This test is no t yet approved or cleared by the [place] FDA and  has been authorized for detection and/or diagnosis of SARS-CoV-2 by FDA under an Emergency Use Authorization (EUA). This EUA will remain  in effect (meaning this test can be used) for the duration of the COVID-19 declaration under Section 564(b)(1) of the Act, 21 U.S.C.section 360bbb-3(b)(1), unless the authorization is terminated  or revoked sooner.       Influenza A by PCR NEGATIVE NEGATIVE Final   Influenza B by PCR NEGATIVE NEGATIVE Final    Comment: (NOTE) The Xpert Xpress SARS-CoV-2/FLU/RSV plus assay is intended as an aid in the diagnosis of influenza from Nasopharyngeal swab specimens and should not be used as a sole basis for treatment. Nasal washings and aspirates are unacceptable for Xpert Xpress SARS-CoV-2/FLU/RSV testing.  Fact Sheet for Patients: EntrepreneurPulse.com.au  Fact Sheet for Healthcare Providers: IncredibleEmployment.be  This test is not yet approved or cleared by the [place] FDA and has been authorized for detection and/or diagnosis of SARS-CoV-2 by FDA under an Emergency Use Authorization (EUA). This EUA will remain in effect (meaning this test can be used) for the duration of the COVID-19 declaration under Section 564(b)(1) of the Act, 21  U.S.C. section 360bbb-3(b)(1), unless the authorization is terminated or revoked.  Performed at [facility], [address] [place], [place] [address]   Blood culture (routine x 2)     Status: None (Preliminary result)   Collection Time: 03/05/22 10:40 PM   Specimen: BLOOD  Result Value Ref Range Status   Specimen Description BLOOD BLOOD LEFT ARM  Final   Special Requests   Final    BOTTLES DRAWN AEROBIC AND ANAEROBIC Blood Culture adequate volume   Culture   Final    NO GROWTH 2 DAYS Performed at [facility], [address] [place], [place] [address]  Report Status PENDING  Incomplete  Blood culture (routine x 2)     Status: None (Preliminary result)   Collection Time: 03/05/22 10:40 PM   Specimen: BLOOD  Result Value Ref Range Status   Specimen Description BLOOD BLOOD LEFT ARM  Final   Special Requests   Final    BOTTLES DRAWN AEROBIC AND ANAEROBIC Blood Culture adequate volume   Culture   Final    NO GROWTH 2 DAYS Performed at [facility], [address], [place], [place] [address]    Report Status PENDING  Incomplete     Labs: BNP (last 3 results) No results for input(s): "BNP" in the last 8760 hours. Basic Metabolic Panel: Recent Labs  Lab 03/05/22 2105 03/06/22 1553 03/07/22 0818  NA 133* 137  --   K 3.2* 2.7* 3.1*  CL 100 105  --   CO2 22 24  --   GLUCOSE 188* 100*  --   BUN 13 11  --   CREATININE 0.83 0.60  --   CALCIUM 8.6* 7.8*  --    Liver Function Tests: Recent Labs  Lab 03/05/22 2105 03/06/22 1553  AST 32 17  ALT 35 24  ALKPHOS 50 35*  BILITOT 1.3* 0.9  PROT 6.2* 5.2*  ALBUMIN 3.4* 2.8*   No results for input(s): "LIPASE", "AMYLASE" in the last 168 hours. No results for input(s): "AMMONIA" in the last 168 hours. CBC: Recent Labs  Lab 03/05/22 2105 03/06/22 1553 03/07/22 0818  WBC 0.4* 2.1* 3.9*  NEUTROABS 0.0*  --  2.3  HGB 9.5* 7.5* 7.9*  7.9*  HCT 29.1* 22.5* 24.3*  24.0*  MCV 100.1*  101.4* 102.5*  PLT 91* 72* 80*   Cardiac Enzymes: No results for input(s): "CKTOTAL", "CKMB", "CKMBINDEX", "TROPONINI" in the last 168 hours. BNP: Invalid input(s): "POCBNP" CBG: No results for input(s): "GLUCAP" in the last 168 hours. D-Dimer No results for input(s): "DDIMER" in the last 72 hours. Hgb A1c No results for input(s): "HGBA1C" in the last 72 hours. Lipid Profile No results for input(s): "CHOL", "HDL", "LDLCALC", "TRIG", "CHOLHDL", "LDLDIRECT" in the last 72 hours. Thyroid function studies No results for input(s): "TSH", "T4TOTAL", "T3FREE", "THYROIDAB" in the last 72 hours.  Invalid input(s): "FREET3" Anemia work up No results for input(s): "VITAMINB12", "FOLATE", "FERRITIN", "TIBC", "IRON", "RETICCTPCT" in the last 72 hours. Urinalysis    Component Value Date/Time   COLORURINE STRAW (A) 03/06/2022 0346   APPEARANCEUR CLEAR (A) 03/06/2022 0346   LABSPEC 1.006 03/06/2022 0346   PHURINE 8.0 03/06/2022 0346   GLUCOSEU 50 (A) 03/06/2022 0346   HGBUR NEGATIVE 03/06/2022 0346   BILIRUBINUR NEGATIVE 03/06/2022 0346   KETONESUR NEGATIVE 03/06/2022 0346   PROTEINUR NEGATIVE 03/06/2022 0346   NITRITE NEGATIVE 03/06/2022 0346   LEUKOCYTESUR NEGATIVE 03/06/2022 0346   Sepsis Labs Recent Labs  Lab 03/05/22 2105 03/06/22 1553 03/07/22 0818  WBC 0.4* 2.1* 3.9*   Microbiology Recent Results (from the past 240 hour(s))  Resp Panel by RT-PCR (Flu A&B, Covid) Anterior Nasal Swab     Status: None   Collection Time: 03/05/22 10:02 PM   Specimen: Anterior Nasal Swab  Result Value Ref Range Status   SARS Coronavirus 2 by RT PCR NEGATIVE NEGATIVE Final    Comment: (NOTE) SARS-CoV-2 target nucleic acids are NOT DETECTED.  The SARS-CoV-2 RNA is generally detectable in upper respiratory specimens during the acute phase of infection. The lowest concentration of SARS-CoV-2 viral copies this assay can detect is 138 copies/mL. A negative result  does not preclude  SARS-Cov-2 infection and should not be used as the sole basis for treatment or other patient management decisions. A negative result may occur with  improper specimen collection/handling, submission of specimen other than nasopharyngeal swab, presence of viral mutation(s) within the areas targeted by this assay, and inadequate number of viral copies(<138 copies/mL). A negative result must be combined with clinical observations, patient history, and epidemiological information. The expected result is Negative.  Fact Sheet for Patients:  EntrepreneurPulse.com.au  Fact Sheet for Healthcare Providers:  IncredibleEmployment.be  This test is no t yet approved or cleared by the [place] FDA and  has been authorized for detection and/or diagnosis of SARS-CoV-2 by FDA under an Emergency Use Authorization (EUA). This EUA will remain  in effect (meaning this test can be used) for the duration of the COVID-19 declaration under Section 564(b)(1) of the Act, 21 U.S.C.section 360bbb-3(b)(1), unless the authorization is terminated  or revoked sooner.       Influenza A by PCR NEGATIVE NEGATIVE Final   Influenza B by PCR NEGATIVE NEGATIVE Final    Comment: (NOTE) The Xpert Xpress SARS-CoV-2/FLU/RSV plus assay is intended as an aid in the diagnosis of influenza from Nasopharyngeal swab specimens and should not be used as a sole basis for treatment. Nasal washings and aspirates are unacceptable for Xpert Xpress SARS-CoV-2/FLU/RSV testing.  Fact Sheet for Patients: EntrepreneurPulse.com.au  Fact Sheet for Healthcare Providers: IncredibleEmployment.be  This test is not yet approved or cleared by the [place] FDA and has been authorized for detection and/or diagnosis of SARS-CoV-2 by FDA under an Emergency Use Authorization (EUA). This EUA will remain in effect (meaning this test can be used) for the duration of  the COVID-19 declaration under Section 564(b)(1) of the Act, 21 U.S.C. section 360bbb-3(b)(1), unless the authorization is terminated or revoked.  Performed at [facility], [address] [place], [place] [address]   Blood culture (routine x 2)     Status: None (Preliminary result)   Collection Time: 03/05/22 10:40 PM   Specimen: BLOOD  Result Value Ref Range Status   Specimen Description BLOOD BLOOD LEFT ARM  Final   Special Requests   Final    BOTTLES DRAWN AEROBIC AND ANAEROBIC Blood Culture adequate volume   Culture   Final    NO GROWTH 2 DAYS Performed at [facility], [address], [place], [place] [address]    Report Status PENDING  Incomplete  Blood culture (routine x 2)     Status: None (Preliminary result)   Collection Time: 03/05/22 10:40 PM   Specimen: BLOOD  Result Value Ref Range Status   Specimen Description BLOOD BLOOD LEFT ARM  Final   Special Requests   Final    BOTTLES DRAWN AEROBIC AND ANAEROBIC Blood Culture adequate volume   Culture   Final    NO GROWTH 2 DAYS Performed at [facility], [address] [place], [place] [address]    Report Status PENDING  Incomplete     Time coordinating discharge: Over 30 minutes  SIGNED:   Shawna Clamp, MD  [facility] 03/07/2022, 3:29 PM Pager   If 7PM-7AM, please contact night-coverage

## 2022-03-07 NOTE — Consult Note (Addendum)
Hematology/Oncology Consult note [phone] [phone]      Patient Care Team: Latanya Maudlin, NP as PCP - General (Family Medicine) Daiva Huge, RN as Oncology Nurse Navigator Cammie Sickle, [name]   Name of the patient: Marilyn Page  [id number]  29-Dec-1975   REASON FOR COSULTATION:  Neutropenia fever History of presenting illness-  46 y.o. female with PMH listed at below who presents to ER for evaluation of fever of 102.  She has felt that the nausea as well.  Intermittent abdominal pain around umbilical area Upon admission, 03/05/2022 CBC showed ANC of 0, total white count 0.4 Patient follows up with oncology Dr. 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  Last chemotherapy on 922 and received long-acting G-CSF Ziextenzo 9/25. Patient was admitted for neutropenia fever treatment was started on empiric vancomycin and cefepime.  Blood culture remain negative no growth for 2 days.  Urine analysis negative.  03/05/2022 chest x-ray no acute intra thoracic process. I recommend continue empiric antibiotics, monitor CBC closely, no benefit of short acting G-CSF  Today patient feels much better.  She remains hemodynamically stable and afebrile during current admission.  CBC showed total WBC 3.9, ANC has recovered to 2.3.  Patient denies fever, rigors, abdominal pain, nausea vomiting diarrhea today.   Allergies  Allergen Reactions   Metformin Other (See Comments)    Patient Active Problem List   Diagnosis Date Noted   Lactic acid acidosis 03/06/2022   Fever and chills 03/05/2022   COVID-19 virus infection 12/28/2021   Thrombocytopenia ([place]) 12/28/2021   Diabetes mellitus ([place]) 12/28/2021   Febrile neutropenia ([place]) 12/27/2021   Sepsis ([place]) 12/27/2021   Genetic testing 12/01/2021   Carcinoma of upper-outer quadrant of right breast in female, estrogen receptor positive  ([place]) 10/26/2021   ASCUS of cervix with negative high risk HPV 04/23/2021   Iron deficiency anemia due to chronic blood loss 04/23/2021   Menorrhagia with regular cycle 10/14/2017   BMI 37.0-37.9, adult 10/03/2017   History of gestational diabetes 10/03/2017     Past Medical History:  Diagnosis Date   Carcinoma of upper-outer quadrant of right breast in female, estrogen receptor positive ([place]) 10/26/2021   Diabetes mellitus without complication ([place])    History of gestational diabetes    Personal history of chemotherapy      Past Surgical History:  Procedure Laterality Date   BREAST BIOPSY Right 10/19/2021   [place] Core bx 10:00 6 cmfn Ribbon Clip-INVASIVE MAMMARY CARCINOMA,. Axilla Butterfly hydro clip-MACRO METASTATIC MAMMARY CARCINOMA   CESAREAN SECTION  2005/2008   DILATION AND CURETTAGE OF UTERUS  2003   PORTACATH PLACEMENT Left 11/09/2021   Procedure: INSERTION PORT-A-CATH;  Surgeon: Robert Bellow, MD;  Location: [facility] ORS;  Service: General;  Laterality: Left;   SKIN BIOPSY Right 11/09/2021   Procedure: PUNCH BIOPSY SKIN, TATTOO LYMPH NODE RIGHT AXILLA;  Surgeon: Robert Bellow, MD;  Location: [facility] ORS;  Service: General;  Laterality: Right;    Social History   Socioeconomic History   Marital status: Married    Spouse name: Jaci Standard   Number of children: 2   Years of education: Not on file   Highest education level: Not on file  Occupational History   Occupation: and Optometrist  Tobacco Use   Smoking status: Never   Smokeless tobacco: Never  Vaping Use   Vaping Use: Never used  Substance and Sexual Activity   Alcohol use: Never  Drug use: Never   Sexual activity: Yes    Birth control/protection: Other-see comments, Surgical    Comment: vasectomy  Other Topics Concern   Not on file  Social History Narrative   Pre-school teacher; in [place]; 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    Social Determinants of Health   Financial Resource Strain: Not on file   Food Insecurity: Not on file  Transportation Needs: Not on file  Physical Activity: Inactive (09/26/2017)   Exercise Vital Sign    Days of Exercise per Week: 0 days    Minutes of Exercise per Session: 0 min  Stress: Not on file  Social Connections: Not on file  Intimate Partner Violence: Not on file     Family History  Problem Relation Age of Onset   Ovarian cysts Mother    Migraines Mother    Melanoma Mother        on leg   Hyperlipidemia Father    Diabetes Maternal Aunt    Stomach cancer Paternal Aunt    Diabetes Maternal Grandfather    Lymphoma Cousin 17   Breast cancer Neg Hx      Current Facility-Administered Medications:    acetaminophen (TYLENOL) tablet 650 mg, 650 mg, Oral, Q6H PRN **OR** acetaminophen (TYLENOL) suppository 650 mg, 650 mg, Rectal, Q6H PRN, Posey Pronto, Ekta V, MD   ceFEPIme (MAXIPIME) 2 g in sodium chloride 0.9 % 100 mL IVPB, 2 g, Intravenous, Q8H, Florina Ou V, MD, Last Rate: 200 mL/hr at 03/07/22 0555, 2 g at 03/07/22 0555   Chlorhexidine Gluconate Cloth 2 % PADS 6 each, 6 each, Topical, Daily, Florina Ou V, MD, 6 each at 03/06/22 1726   HYDROcodone-acetaminophen (NORCO/VICODIN) 5-325 MG per tablet 1 tablet, 1 tablet, Oral, Q4H PRN, Florina Ou V, MD   morphine (PF) 2 MG/ML injection 2 mg, 2 mg, Intravenous, Q4H PRN, Para Skeans, MD   ondansetron (ZOFRAN) injection 4 mg, 4 mg, Intravenous, Q6H PRN, Sharion Settler, NP   pantoprazole (PROTONIX) injection 40 mg, 40 mg, Intravenous, Q12H, Florina Ou V, MD, 40 mg at 03/07/22 0951   sodium chloride flush (NS) 0.9 % injection 3 mL, 3 mL, Intravenous, Q12H, Florina Ou V, MD, 3 mL at 03/07/22 0956   [START ON 03/08/2022] vancomycin (VANCOREADY) IVPB 1750 mg/350 mL, 1,750 mg, Intravenous, Q24H, Nazari, Walid A, RPH  Review of Systems  Constitutional:  Negative for appetite change, chills, fatigue and fever.  HENT:   Negative for hearing loss and voice change.   Eyes:  Negative for eye problems.   Respiratory:  Negative for chest tightness and cough.   Cardiovascular:  Negative for chest pain.  Gastrointestinal:  Negative for abdominal distention, abdominal pain and blood in stool.  Endocrine: Negative for hot flashes.  Genitourinary:  Negative for difficulty urinating and frequency.   Musculoskeletal:  Negative for arthralgias.  Skin:  Negative for itching and rash.  Neurological:  Negative for extremity weakness.  Hematological:  Negative for adenopathy.  Psychiatric/Behavioral:  Negative for confusion.     PHYSICAL EXAM Vitals:   03/07/22 0024 03/07/22 0312 03/07/22 0511 03/07/22 0905  BP: (!) 102/53  (!) 111/56 110/68  Pulse: 96  97 98  Resp: '20  20 17  '$ Temp: 98.7 F (37.1 C)  98 F (36.7 C) 99.1 F (37.3 C)  TempSrc:      SpO2: 98%  99% 98%  Weight:  214 lb 11.7 oz (97.4 kg)    Height:       Physical Exam Constitutional:  General: She is not in acute distress.    Appearance: She is not diaphoretic.  HENT:     Head: Normocephalic and atraumatic.     Nose: Nose normal.  Eyes:     General: No scleral icterus. Cardiovascular:     Rate and Rhythm: Normal rate and regular rhythm.     Heart sounds: No murmur heard. Pulmonary:     Effort: Pulmonary effort is normal. No respiratory distress.     Breath sounds: No rales.  Chest:     Chest wall: No tenderness.  Abdominal:     General: There is no distension.     Palpations: Abdomen is soft.     Tenderness: There is no abdominal tenderness.  Musculoskeletal:        General: Normal range of motion.     Cervical back: Normal range of motion and neck supple.  Skin:    General: Skin is warm and dry.     Findings: No erythema.     Comments: Left anterior chest wall Mediport no erythema/tenderness  Neurological:     Mental Status: She is alert and oriented to person, place, and time.     Cranial Nerves: No cranial nerve deficit.     Motor: No abnormal muscle tone.     Coordination: Coordination normal.   Psychiatric:        Mood and Affect: Mood and affect normal.       LABORATORY STUDIES    Latest Ref Rng & Units 03/07/2022    8:18 AM 03/06/2022    3:53 PM 03/05/2022    9:05 PM  CBC  WBC 4.0 - 10.5 K/uL  2.1  0.4   Hemoglobin 12.0 - 15.0 g/dL 7.9  7.5  9.5   Hematocrit 36.0 - 46.0 % 24.0  22.5  29.1   Platelets 150 - 400 K/uL  72  91       Latest Ref Rng & Units 03/07/2022    8:18 AM 03/06/2022    3:53 PM 03/05/2022    9:05 PM  CMP  Glucose 70 - 99 mg/dL  100  188   BUN 6 - 20 mg/dL  11  13   Creatinine 0.44 - 1.00 mg/dL  0.60  0.83   Sodium 135 - 145 mmol/L  137  133   Potassium 3.5 - 5.1 mmol/L 3.1  2.7  3.2   Chloride 98 - 111 mmol/L  105  100   CO2 22 - 32 mmol/L  24  22   Calcium 8.9 - 10.3 mg/dL  7.8  8.6   Total Protein 6.5 - 8.1 g/dL  5.2  6.2   Total Bilirubin 0.3 - 1.2 mg/dL  0.9  1.3   Alkaline Phos 38 - 126 U/L  35  50   AST 15 - 41 U/L  17  32   ALT 0 - 44 U/L  24  35      RADIOGRAPHIC STUDIES: I have personally reviewed the radiological images as listed and agreed with the findings in the report. DG Chest 2 View  Result Date: 03/05/2022 CLINICAL DATA:  Fever, breast cancer EXAM: CHEST - 2 VIEW COMPARISON:  12/27/2021 FINDINGS: Frontal and lateral views of the chest demonstrates stable left chest wall port. Cardiac silhouette is unremarkable. No acute airspace disease, effusion, or pneumothorax. No acute bony abnormalities. IMPRESSION: 1. No acute intrathoracic process. Electronically Signed   By: Randa Ngo M.D.   On: 03/05/2022 21:30   MM DIAG BREAST  TOMO UNI RIGHT  Result Date: 01/07/2022 CLINICAL DATA:  History of biopsy-proven right breast invasive ductal carcinoma and positive node. Status post 3 rounds chemotherapy, presenting for follow-up imaging to assess treatment response. EXAM: DIGITAL DIAGNOSTIC UNILATERAL RIGHT MAMMOGRAM WITH TOMOSYNTHESIS; ULTRASOUND RIGHT BREAST LIMITED TECHNIQUE: Right digital diagnostic mammography and breast  tomosynthesis was performed.; Targeted ultrasound examination of the right breast was performed COMPARISON:  Previous exam(s). ACR Breast Density Category c: The breast tissue is heterogeneously dense, which may obscure small masses. FINDINGS: Mammogram: Redemonstrated mass with associated architectural distortion in the right upper outer breast, with an associated ribbon shaped biopsy marking clip, corresponding to biopsy-proven malignancy. Notably, accurate size measurement of the mass on mammogram is slightly limited given the irregular margin and surrounding dense breast tissue. Persistent right breast skin thickening. Decreased right axillary lymphadenopathy. Ultrasound: Targeted right breast ultrasound redemonstrates an irregular hypoechoic mass in the right breast 10 o'clock position 6 cm from the nipple. The mass measures up to 2.0 x 1.1 by 1.3 cm, previously measuring 2.9 x 1.9 x 2.0 cm in April 2023. Incidentally visualized benign simple cyst in the right breast 9:30 position measuring up to 3.4 x 1.3 x 3.3 cm. Targeted right axillary ultrasound demonstrates decreased right axillary lymphadenopathy, with abnormal cortical thickening up to 3 mm, previously 8 mm. IMPRESSION: 1. Slightly decreased size of the palpable biopsy-proven malignancy in the right breast 10 o'clock position, measuring up to 2.0 cm and previously measuring up to 2.9 cm. 2.  Persistent right breast skin thickening. 3.  Decreased right axillary lymphadenopathy. RECOMMENDATION: Recommend continued surgical and oncologic follow up. I have discussed the findings and recommendations with the patient. If applicable, a reminder letter will be sent to the patient regarding the next appointment. BI-RADS CATEGORY  6: Known biopsy-proven malignancy. Electronically Signed   By: Beryle Flock M.D.   On: 01/07/2022 11:00  US Breast Limited Uni Right Inc Axilla  Result Date: 01/07/2022 CLINICAL DATA:  History of biopsy-proven right breast  invasive ductal carcinoma and positive node. Status post 3 rounds chemotherapy, presenting for follow-up imaging to assess treatment response. EXAM: DIGITAL DIAGNOSTIC UNILATERAL RIGHT MAMMOGRAM WITH TOMOSYNTHESIS; ULTRASOUND RIGHT BREAST LIMITED TECHNIQUE: Right digital diagnostic mammography and breast tomosynthesis was performed.; Targeted ultrasound examination of the right breast was performed COMPARISON:  Previous exam(s). ACR Breast Density Category c: The breast tissue is heterogeneously dense, which may obscure small masses. FINDINGS: Mammogram: Redemonstrated mass with associated architectural distortion in the right upper outer breast, with an associated ribbon shaped biopsy marking clip, corresponding to biopsy-proven malignancy. Notably, accurate size measurement of the mass on mammogram is slightly limited given the irregular margin and surrounding dense breast tissue. Persistent right breast skin thickening. Decreased right axillary lymphadenopathy. Ultrasound: Targeted right breast ultrasound redemonstrates an irregular hypoechoic mass in the right breast 10 o'clock position 6 cm from the nipple. The mass measures up to 2.0 x 1.1 by 1.3 cm, previously measuring 2.9 x 1.9 x 2.0 cm in April 2023. Incidentally visualized benign simple cyst in the right breast 9:30 position measuring up to 3.4 x 1.3 x 3.3 cm. Targeted right axillary ultrasound demonstrates decreased right axillary lymphadenopathy, with abnormal cortical thickening up to 3 mm, previously 8 mm. IMPRESSION: 1. Slightly decreased size of the palpable biopsy-proven malignancy in the right breast 10 o'clock position, measuring up to 2.0 cm and previously measuring up to 2.9 cm. 2.  Persistent right breast skin thickening. 3.  Decreased right axillary lymphadenopathy. RECOMMENDATION: Recommend continued surgical and  oncologic follow up. I have discussed the findings and recommendations with the patient. If applicable, a reminder letter will be  sent to the patient regarding the next appointment. BI-RADS CATEGORY  6: Known biopsy-proven malignancy. Electronically Signed   By: Beryle Flock M.D.   On: 01/07/2022 11:00  CT Angio Chest Pulmonary Embolism (PE) W or WO Contrast  Result Date: 12/28/2021 CLINICAL DATA:  High clinical suspicion for PE EXAM: CT ANGIOGRAPHY CHEST WITH CONTRAST TECHNIQUE: Multidetector CT imaging of the chest was performed using the standard protocol during bolus administration of intravenous contrast. Multiplanar CT image reconstructions and MIPs were obtained to evaluate the vascular anatomy. RADIATION DOSE REDUCTION: This exam was performed according to the departmental dose-optimization program which includes automated exposure control, adjustment of the mA and/or kV according to patient size and/or use of iterative reconstruction technique. CONTRAST:  52m OMNIPAQUE IOHEXOL 350 MG/ML SOLN COMPARISON:  Previous studies including the chest radiograph done on 12/27/2021 FINDINGS: Cardiovascular: There is homogeneous enhancement in thoracic aorta. There are no intraluminal filling defects in pulmonary artery branches. There is ectasia of the main pulmonary artery measuring 3.8 cm suggesting pulmonary arterial hypertension. Mediastinum/Nodes: There are slightly enlarged lymph nodes in mediastinum. Lungs/Pleura: There is no focal pulmonary consolidation. There is no pleural effusion or pneumothorax. Upper Abdomen: There is fatty infiltration liver. Spleen measures 14 cm in AP diameter. Musculoskeletal: No acute findings are seen. Review of the MIP images confirms the above findings. IMPRESSION: There is no evidence of pulmonary artery embolism. There is no evidence of thoracic aortic dissection. There is no focal pulmonary consolidation. Fatty liver.  Enlarged spleen. Electronically Signed   By: PElmer PickerM.D.   On: 12/28/2021 13:51   CT ABDOMEN PELVIS W CONTRAST  Result Date: 12/27/2021 CLINICAL DATA:  Abdominal  pain. Patient complains of cough fever for 2 days. EXAM: CT ABDOMEN AND PELVIS WITH CONTRAST TECHNIQUE: Multidetector CT imaging of the abdomen and pelvis was performed using the standard protocol following bolus administration of intravenous contrast. RADIATION DOSE REDUCTION: This exam was performed according to the departmental dose-optimization program which includes automated exposure control, adjustment of the mA and/or kV according to patient size and/or use of iterative reconstruction technique. CONTRAST:  1053mOMNIPAQUE IOHEXOL 300 MG/ML  SOLN COMPARISON:  None Available. FINDINGS: Lower chest: No acute abnormality. Mild right breast skin thickening. Hepatobiliary: No focal liver abnormality is seen. No gallstones, gallbladder wall thickening, or biliary dilatation. Pancreas: Unremarkable. No pancreatic ductal dilatation or surrounding inflammatory changes. Spleen: Normal in size without focal abnormality. Adrenals/Urinary Tract: Mild left adrenal thickening. Kidneys are normal, without renal calculi, focal lesion, or hydronephrosis. Bladder is unremarkable. Stomach/Bowel: Stomach is within normal limits. Appendix appears normal. No evidence of bowel wall thickening, distention, or inflammatory changes. Vascular/Lymphatic: No significant vascular findings are present. No enlarged abdominal or pelvic lymph nodes. Reproductive: Mildly enlarged uterus.  No adnexal mass. Other: No abdominal wall hernia or abnormality. No abdominopelvic ascites. Musculoskeletal: Degenerate disc disease of the lumbar spine prominent at L4-L5 and L5-S1. No acute osseous abnormality. IMPRESSION: 1.  No CT evidence of acute abdominal/pelvic process. 2. Mild left adrenal nodular thickening, which likely represent small adrenal adenoma. 3. Bowel loops are normal in caliber. Normal appendix. No evidence of colitis or diverticulitis. 3. Mildly enlarged globular uterus, pelvic sonogram for further evaluation would be helpful on a  nonemergent basis. 4. 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 Electronically Signed   By: ImWyatt Mage  Ahmed D.O.   On: 12/27/2021 23:12   DG Chest 2 View  Result Date: 12/27/2021 CLINICAL DATA:  Cough, chest pain and shortness of breath. EXAM: CHEST - 2 VIEW COMPARISON:  November 09, 2021 FINDINGS: There is stable left-sided venous Port-A-Cath positioning. The heart size and mediastinal contours are within normal limits. Both lungs are clear. The visualized skeletal structures are unremarkable. IMPRESSION: No active cardiopulmonary disease. Electronically Signed   By: Virgina Norfolk M.D.   On: 12/27/2021 21:34     Assessment and plan-   #Neutropenia fever ANC has completely resolved.  Patient has received empiric antibiotics vancomycin and cefepime.  Blood culture no growth in 2 days.  UA was negative.  Chest x-ray negative. Clinically patient is also doing much better today.  Recommend to stop antibiotics Okay for discharge home today.  She has appointment follow-up with Dr. Rogue Bussing early next week.  #Triple positive breast cancer, outpatient follow-up with Dr. Rogue Bussing. #Thrombocytopenia, platelet count is 80,000.  Secondary to chemotherapy.  Monitor counts outpatient. Thank you for allowing me to participate in the care of this patient.   Earlie Server, MD, PhD Hematology Oncology 03/07/2022

## 2022-03-07 NOTE — Progress Notes (Signed)
Pharmacy Antibiotic Note  Marilyn Page is a 46 y.o. female admitted on 03/05/2022 with  febrile neutropenia .  Pharmacy has been consulted for Vancomycin dosing.  Plan: Adjusting Vancomycin to 1750 mg Q24h  AUC = 499.4 Vanc trough = 9 Vd used = 0.5  Height: '5\' 6"'$  (167.6 cm) Weight: 97.4 kg (214 lb 11.7 oz) IBW/kg (Calculated) : 59.3  Temp (24hrs), Avg:98.5 F (36.9 C), Min:98 F (36.7 C), Max:99.1 F (37.3 C)  Recent Labs  Lab 03/05/22 2105 03/06/22 0150 03/06/22 1553  WBC 0.4*  --  2.1*  CREATININE 0.83  --  0.60  LATICACIDVEN 2.5* 2.7*  --      Estimated Creatinine Clearance: 102.2 mL/min (by C-G formula based on SCr of 0.6 mg/dL).    Allergies  Allergen Reactions   Metformin Other (See Comments)    Antimicrobials this admission:  Vancomycin 9/29 >>   Cefepime 9/29 >>   Dose adjustments this admission: Adjusted Vancomycin '750mg'$  Q12 to '1750mg'$  Q24  Microbiology results: 9/29 BCx: NGTD    Thank you for allowing pharmacy to be a part of this patient's care.  Haruki Arnold A Uzoma Vivona 03/07/2022 10:04 AM

## 2022-03-08 ENCOUNTER — Inpatient Hospital Stay: Payer: 59

## 2022-03-08 ENCOUNTER — Other Ambulatory Visit: Payer: Self-pay

## 2022-03-08 ENCOUNTER — Inpatient Hospital Stay: Payer: 59 | Attending: Internal Medicine

## 2022-03-08 ENCOUNTER — Inpatient Hospital Stay (HOSPITAL_BASED_OUTPATIENT_CLINIC_OR_DEPARTMENT_OTHER): Payer: 59 | Admitting: Medical Oncology

## 2022-03-08 ENCOUNTER — Other Ambulatory Visit: Payer: Self-pay | Admitting: *Deleted

## 2022-03-08 VITALS — BP 134/72 | HR 109 | Temp 98.1°F | Resp 20 | Ht 66.0 in | Wt 210.0 lb

## 2022-03-08 VITALS — BP 113/72 | HR 84 | Resp 18

## 2022-03-08 DIAGNOSIS — Z170 Estrogen receptor positive status [ER+]: Secondary | ICD-10-CM

## 2022-03-08 DIAGNOSIS — E876 Hypokalemia: Secondary | ICD-10-CM | POA: Diagnosis not present

## 2022-03-08 DIAGNOSIS — D649 Anemia, unspecified: Secondary | ICD-10-CM | POA: Diagnosis not present

## 2022-03-08 DIAGNOSIS — R5081 Fever presenting with conditions classified elsewhere: Secondary | ICD-10-CM

## 2022-03-08 DIAGNOSIS — D709 Neutropenia, unspecified: Secondary | ICD-10-CM

## 2022-03-08 DIAGNOSIS — C50411 Malignant neoplasm of upper-outer quadrant of right female breast: Secondary | ICD-10-CM | POA: Insufficient documentation

## 2022-03-08 DIAGNOSIS — M7989 Other specified soft tissue disorders: Secondary | ICD-10-CM | POA: Diagnosis not present

## 2022-03-08 DIAGNOSIS — Z79899 Other long term (current) drug therapy: Secondary | ICD-10-CM | POA: Diagnosis not present

## 2022-03-08 DIAGNOSIS — L03011 Cellulitis of right finger: Secondary | ICD-10-CM

## 2022-03-08 DIAGNOSIS — E119 Type 2 diabetes mellitus without complications: Secondary | ICD-10-CM | POA: Diagnosis not present

## 2022-03-08 DIAGNOSIS — E860 Dehydration: Secondary | ICD-10-CM | POA: Insufficient documentation

## 2022-03-08 DIAGNOSIS — Z9221 Personal history of antineoplastic chemotherapy: Secondary | ICD-10-CM | POA: Diagnosis not present

## 2022-03-08 DIAGNOSIS — Z9011 Acquired absence of right breast and nipple: Secondary | ICD-10-CM | POA: Insufficient documentation

## 2022-03-08 LAB — CBC WITH DIFFERENTIAL/PLATELET
Abs Immature Granulocytes: 0.06 10*3/uL (ref 0.00–0.07)
Basophils Absolute: 0.1 10*3/uL (ref 0.0–0.1)
Basophils Relative: 1 %
Eosinophils Absolute: 0 10*3/uL (ref 0.0–0.5)
Eosinophils Relative: 0 %
HCT: 25.5 % — ABNORMAL LOW (ref 36.0–46.0)
Hemoglobin: 8.5 g/dL — ABNORMAL LOW (ref 12.0–15.0)
Immature Granulocytes: 1 %
Lymphocytes Relative: 20 %
Lymphs Abs: 0.8 10*3/uL (ref 0.7–4.0)
MCH: 33.5 pg (ref 26.0–34.0)
MCHC: 33.3 g/dL (ref 30.0–36.0)
MCV: 100.4 fL — ABNORMAL HIGH (ref 80.0–100.0)
Monocytes Absolute: 0.4 10*3/uL (ref 0.1–1.0)
Monocytes Relative: 11 %
Neutro Abs: 2.8 10*3/uL (ref 1.7–7.7)
Neutrophils Relative %: 67 %
Platelets: 80 10*3/uL — ABNORMAL LOW (ref 150–400)
RBC: 2.54 MIL/uL — ABNORMAL LOW (ref 3.87–5.11)
RDW: 16.4 % — ABNORMAL HIGH (ref 11.5–15.5)
Smear Review: NORMAL
WBC: 4.2 10*3/uL (ref 4.0–10.5)
nRBC: 0 % (ref 0.0–0.2)

## 2022-03-08 LAB — COMPREHENSIVE METABOLIC PANEL
ALT: 20 U/L (ref 0–44)
AST: 20 U/L (ref 15–41)
Albumin: 2.9 g/dL — ABNORMAL LOW (ref 3.5–5.0)
Alkaline Phosphatase: 42 U/L (ref 38–126)
Anion gap: 4 — ABNORMAL LOW (ref 5–15)
BUN: 6 mg/dL (ref 6–20)
CO2: 25 mmol/L (ref 22–32)
Calcium: 8 mg/dL — ABNORMAL LOW (ref 8.9–10.3)
Chloride: 110 mmol/L (ref 98–111)
Creatinine, Ser: 0.6 mg/dL (ref 0.44–1.00)
GFR, Estimated: >60 mL/min (ref >60)
Glucose, Bld: 111 mg/dL — ABNORMAL HIGH (ref 70–99)
Potassium: 2.9 mmol/L — ABNORMAL LOW (ref 3.5–5.1)
Sodium: 139 mmol/L (ref 135–145)
Total Bilirubin: 0.5 mg/dL (ref 0.3–1.2)
Total Protein: 5.7 g/dL — ABNORMAL LOW (ref 6.5–8.1)

## 2022-03-08 LAB — MAGNESIUM: Magnesium: 1.6 mg/dL — ABNORMAL LOW (ref 1.7–2.4)

## 2022-03-08 MED ORDER — POTASSIUM CHLORIDE IN NACL 20-0.9 MEQ/L-% IV SOLN
Freq: Once | INTRAVENOUS | Status: AC
  Filled 2022-03-08: qty 1000

## 2022-03-08 MED ORDER — HEPARIN SOD (PORK) LOCK FLUSH 100 UNIT/ML IV SOLN
500.0000 [IU] | Freq: Once | INTRAVENOUS | Status: AC
  Administered 2022-03-08: 500 [IU] via INTRAVENOUS
  Filled 2022-03-08: qty 5

## 2022-03-08 MED ORDER — POTASSIUM CHLORIDE 20 MEQ PO PACK: 20.0000 meq | PACK | Freq: Every day | ORAL | 3 refills | Status: DC

## 2022-03-08 MED ORDER — MAGNESIUM SULFATE 2 GM/50ML IV SOLN
2.0000 g | Freq: Once | INTRAVENOUS | Status: AC
  Administered 2022-03-08: 2 g via INTRAVENOUS
  Filled 2022-03-08: qty 50

## 2022-03-08 MED ORDER — SODIUM CHLORIDE 0.9% FLUSH
10.0000 mL | Freq: Once | INTRAVENOUS | Status: AC
  Administered 2022-03-08: 10 mL via INTRAVENOUS
  Filled 2022-03-08: qty 10

## 2022-03-08 NOTE — Progress Notes (Signed)
Symptom Management [place] at [facility] [phone] [phone]  Patient Care Team: Latanya Maudlin, NP as PCP - General (Family Medicine) Daiva Huge, RN as Oncology Nurse Navigator Cammie Sickle, [name]   Name of the patient: Marilyn Page  [id number]  [birth year]   Date of visit: 03/08/22  Reason for Consult: Marilyn Page is a 46 y.o. female who presents today for:  Dehydration: Pt was scheduled to be seen today for fluids post last chemotherapy treatment(neoadjuvant [facility] 02/26/2022). In the interim she was hospitalized from (03/05/2022-03/07/2022) for neutropenic fever with nausea. She was started on Vancomycin and cefepime and IVF. Blood cultures revealed no growth. COVID-19 testing was negative. She improved and was discharged home.Of note she has follow up with Dr. Terri Piedra for surgical consultation on Thursday and has a CT chest/abd/pelvis in roughly 1 week.   Today she reports that she is feeling much better. She has not reported a fever since discharge and is staying hydrated. She reports 1-3 episodes of loose stools per day but no vomiting or nausea. Her area of paronychia continues to heal and she is no longer getting purulent discharge from this area.    Denies any neurologic complaints. Denies recent fevers or illnesses. Denies any easy bleeding or bruising. Reports good appetite and denies weight loss. Denies chest pain. Denies any nausea, vomiting, constipation, or diarrhea. Denies urinary complaints. Patient offers no further specific complaints today.    PAST MEDICAL HISTORY: Past Medical History:  Diagnosis Date   Carcinoma of upper-outer quadrant of right breast in female, estrogen receptor positive ([place]) 10/26/2021   Diabetes mellitus without complication ([place])    History of gestational diabetes    Personal history of chemotherapy     PAST SURGICAL HISTORY:  Past  Surgical History:  Procedure Laterality Date   BREAST BIOPSY Right 10/19/2021   [place] Core bx 10:00 6 cmfn Ribbon Clip-INVASIVE MAMMARY CARCINOMA,. Axilla Butterfly hydro clip-MACRO METASTATIC MAMMARY CARCINOMA   CESAREAN SECTION  2005/2008   DILATION AND CURETTAGE OF UTERUS  2003   PORTACATH PLACEMENT Left 11/09/2021   Procedure: INSERTION PORT-A-CATH;  Surgeon: Robert Bellow, MD;  Location: [facility] ORS;  Service: General;  Laterality: Left;   SKIN BIOPSY Right 11/09/2021   Procedure: PUNCH BIOPSY SKIN, TATTOO LYMPH NODE RIGHT AXILLA;  Surgeon: Robert Bellow, MD;  Location: [facility] ORS;  Service: General;  Laterality: Right;    HEMATOLOGY/ONCOLOGY HISTORY:  Oncology History Overview Note  MAY 2023-    Targeted ultrasound is performed, showing a 2.9 x 1.9 x 2.0 cm irregular hypoechoic mass right breast 10 o'clock position 6 cm from nipple at the site of palpable concern.   There is an abnormal 8 mm thickened right axillary lymph node.   There is skin thickening overlying the right breast. ------------------  A. BREAST, RIGHT AT 10:00, 6 CM FROM THE NIPPLE; ULTRASOUND-GUIDED CORE  NEEDLE BIOPSY:  - INVASIVE MAMMARY CARCINOMA, NO SPECIAL TYPE.   Size of invasive carcinoma: 11 mm in this sample  Histologic grade of invasive carcinoma: Grade 2                       Glandular/tubular differentiation score: 3                       Nuclear pleomorphism score: 2  Mitotic rate score: 1                       Total score: 6  Ductal carcinoma in situ: Present, intermediate grade  Lymphovascular invasion: Not identified   B. LYMPH NODE, RIGHT AXILLARY; ULTRASOUND-GUIDED CORE NEEDLE BIOPSY:  - 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 IN GREATEST  EXTENT.   Comment:  The malignancy in the primary breast lesion appears morphologically  different from tumor in the lymph node sampling, with tumor present in  lymph node more suggestive of a histologic  grade 3, with significantly  increased mitotic activity and pleomorphism. Due to the discrepancy in  these morphologic patterns, ER/PR/HER2 immunohistochemistry will be  performed on both blocks A1 and B1, with reflex to [place] for HER2 2+. The  results will be reported in an addendum.   ADDENDUM:  CASE SUMMARY: BREAST BIOMARKER TESTS - A - BREAST, RIGHT AT 10:00  Estrogen Receptor (ER) Status: POSITIVE          Percentage of cells with nuclear positivity: 71-80%          Average intensity of staining: Strong   Progesterone Receptor (PgR) Status: POSITIVE          Percentage of cells with nuclear positivity: 81-90%          Average intensity of staining: Strong   HER2 (by immunohistochemistry): POSITIVE (Score 3+)       Percentage of cells with uniform intense complete membrane  staining: 61-70%  HER2 displays a heterogeneous staining pattern, with areas showing a  strong 3+ staining pattern, and other regions with complete absence (0+)  of staining.   Ki-67: Not performed   CASE SUMMARY: BREAST BIOMARKER TESTS - B - RIGHT AXILLARY LYMPH NODE  Estrogen Receptor (ER) Status: POSITIVE          Percentage of cells with nuclear positivity: 81-90%          Average intensity of staining: Strong   Progesterone Receptor (PgR) Status: POSITIVE          Percentage of cells with nuclear positivity: 51-60%          Average intensity of staining: Strong   HER2 (by immunohistochemistry): NEGATIVE (Score 1+)  Ki-67: Not performed   #  MAY 2023- STAGE III-T4N1 ER/PR positive HER2 POSITIVE right breast cancer;LN-positive-ER/PR positive however 2 NEGATIVE s/p biopsy [see discussion below].  Discussed with Dr. Sedalia Muta tumor heterogeneity. BREAST MRI- JUNE 2023- The patient's known malignancy in the upper outer quadrant of the right breast measures 3.8 x 2.8 x 3.6 cm. Numerous other suspicious masses are identified in the right breast as above involving the upper outer and upper inner quadrants,  located both anterior and posterior to the known malignancy.  Numerous enhancing foci in the skin as above are worrisome for skin metastases ; Bx proven. Biopsy proven metastatic node in the right axilla.  No evidence of malignancy in the left breast;  Two mildly prominent right internal mammary nodes were not FDG avid on recent PET-CT imaging.  Currently on neoadjuvant chemotherapy- TCH plus P x6 cycles;  MUGA scan May 31st, 2023- -61%.    # June 7th, 2023- NEO-ADJUVANT CHEMO- TCH+P   # Genetic counseling s/p- NEG for any deleterious mutations.     Carcinoma of upper-outer quadrant of right breast in female, estrogen receptor positive ([place])  10/26/2021 Initial Diagnosis   Carcinoma of upper-outer quadrant of right breast in female, estrogen  receptor positive ([place])   10/26/2021 Cancer Staging   Staging form: Breast, AJCC 8th Edition - Clinical: Stage IB (cT2, cN1, cM0, G2, ER+, PR+, HER2+) - Signed by Cammie Sickle, MD on 10/26/2021 Histologic grading system: 3 grade system   11/10/2021 -  Chemotherapy   Patient is on Treatment Plan : BREAST  Docetaxel + Carboplatin + Trastuzumab + Pertuzumab  (TCHP) q21d      11/11/2021 - 01/13/2022 Chemotherapy   Patient is on Treatment Plan : BREAST  Docetaxel + Carboplatin + Trastuzumab + Pertuzumab  (TCHP) q21d       Genetic Testing   Negative genetic testing. No pathogenic variants identified on the [facility] CancerNext-Expanded+RNA panel. The report date is 11/29/2021.  The CancerNext-Expanded + RNAinsight gene panel offered by [employer] and includes sequencing and rearrangement analysis for the following 77 genes: IP, ALK, APC*, ATM*, AXIN2, BAP1, BARD1, BLM, BMPR1A, BRCA1*, BRCA2*, BRIP1*, CDC73, CDH1*,CDK4, CDKN1B, CDKN2A, CHEK2*, CTNNA1, DICER1, FANCC, FH, FLCN, GALNT12, KIF1B, LZTR1, MAX, MEN1, MET, MLH1*, MSH2*, MSH3, MSH6*, MUTYH*, NBN, NF1*, NF2, NTHL1, PALB2*, PHOX2B, PMS2*, POT1, PRKAR1A, PTCH1, PTEN*, RAD51C*, RAD51D*,RB1, RECQL, RET, SDHA,  SDHAF2, SDHB, SDHC, SDHD, SMAD4, SMARCA4, SMARCB1, SMARCE1, STK11, SUFU, TMEM127, TP53*,TSC1, TSC2, VHL and XRCC2 (sequencing and deletion/duplication); EGFR, EGLN1, HOXB13, KIT, MITF, PDGFRA, POLD1 and POLE (sequencing only); EPCAM and GREM1 (deletion/duplication only).     ALLERGIES:  is allergic to metformin.  MEDICATIONS:  Current Outpatient Medications  Medication Sig Dispense Refill   acetaminophen (TYLENOL) 325 MG tablet Take 2 tablets (650 mg total) by mouth every 6 (six) hours as needed for mild pain (or Fever >/= 101).     ascorbic acid (VITAMIN C) 500 MG tablet Take 1 tablet (500 mg total) by mouth daily. 30 tablet    calcium-vitamin D (OSCAL WITH D) 500-5 MG-MCG tablet Take 1 tablet by mouth 2 (two) times daily. 60 tablet 2   Cyanocobalamin (VITAMIN B-12 PO) Take 1 tablet by mouth daily.     ferrous sulfate 325 (65 FE) MG tablet Take 325 mg by mouth 2 (two) times daily with a meal.     Ipratropium-Albuterol (COMBIVENT) 20-100 MCG/ACT AERS respimat Inhale 1 puff into the lungs every 6 (six) hours. (Patient taking differently: Inhale 1 puff into the lungs every 6 (six) hours as needed.) 4 g 0   lidocaine-prilocaine (EMLA) cream Apply on the port. 30 -45 min  prior to port access. 30 g 3   loperamide (IMODIUM) 2 MG capsule Take 1 capsule (2 mg total) by mouth as needed for diarrhea or loose stools. 30 capsule 0   magic mouthwash w/lidocaine SOLN Take 5 mLs by mouth 4 (four) times daily as needed for mouth pain. Sig: Swish/Swallow 5-10 ml four times a day as needed. Dispense 480 ml. 1RF 480 mL 1   norethindrone (MICRONOR) 0.35 MG tablet Take 1 tablet (0.35 mg total) by mouth daily. 84 tablet 2   ondansetron (ZOFRAN-ODT) 8 MG disintegrating tablet Take 8 mg by mouth every 8 (eight) hours as needed for nausea or vomiting.     ONETOUCH VERIO test strip SMARTSIG:1 Each Via Meter Daily PRN     potassium chloride (KLOR-CON) 20 MEQ packet Take 20 mEq by mouth daily. 30 packet 3    prochlorperazine (COMPAZINE) 10 MG tablet Take 1 tablet (10 mg total) by mouth every 6 (six) hours as needed for nausea or vomiting. 40 tablet 1   Semaglutide, 1 MG/DOSE, 4 MG/3ML SOPN Inject into the skin. Inject 1 mg (0.75  ml) subcutaneously once a week.     alum & mag hydroxide-simeth (MAALOX/MYLANTA) 200-200-20 MG/5ML suspension Take 30 mLs by mouth every 4 (four) hours as needed for indigestion. (Patient not taking: Reported on 03/08/2022) 355 mL 0   Multiple Vitamin (MULTIVITAMIN WITH MINERALS) TABS tablet Take 1 tablet by mouth daily. (Patient not taking: Reported on 03/08/2022)     No current facility-administered medications for this visit.   Facility-Administered Medications Ordered in Other Visits  Medication Dose Route Frequency Provider Last Rate Last Admin   0.9 % NaCl with KCl 20 mEq/ L  infusion   Intravenous Once Hughie Closs, [place] 999 mL/hr at 03/08/22 0956 New Bag at 03/08/22 0956   heparin lock flush 100 unit/mL  500 Units Intravenous Once Philo Kurtz M, PA-C       magnesium sulfate IVPB 2 g 50 mL  2 g Intravenous Once Cristina Mattern M, PA-C        VITAL SIGNS: BP 134/72   Pulse (!) 109   Temp 98.1 F (36.7 C) (Tympanic)   Resp 20   Ht 5' 6"  (1.676 m)   Wt 210 lb (95.3 kg)   BMI 33.89 kg/m  Filed Weights   03/08/22 0845  Weight: 210 lb (95.3 kg)    Estimated body mass index is 33.89 kg/m as calculated from the following:   Height as of this encounter: 5' 6"  (1.676 m).   Weight as of this encounter: 210 lb (95.3 kg).  LABS: CBC:    Component Value Date/Time   WBC 4.2 03/08/2022 0845   HGB 8.5 (L) 03/08/2022 0845   HGB 7.8 (L) 04/23/2021 1431   HCT 25.5 (L) 03/08/2022 0845   HCT 25.5 (L) 04/23/2021 1431   PLT 80 (L) 03/08/2022 0845   PLT 384 04/23/2021 1431   MCV 100.4 (H) 03/08/2022 0845   MCV 84 04/23/2021 1431   NEUTROABS 2.8 03/08/2022 0845   NEUTROABS 5.4 04/23/2021 1431   LYMPHSABS 0.8 03/08/2022 0845   LYMPHSABS 2.2 04/23/2021 1431    MONOABS 0.4 03/08/2022 0845   EOSABS 0.0 03/08/2022 0845   EOSABS 0.2 04/23/2021 1431   BASOSABS 0.1 03/08/2022 0845   BASOSABS 0.1 04/23/2021 1431   Comprehensive Metabolic Panel:    Component Value Date/Time   NA 139 03/08/2022 0845   NA 143 09/26/2017 1512   K 2.9 (L) 03/08/2022 0845   CL 110 03/08/2022 0845   CO2 25 03/08/2022 0845   BUN 6 03/08/2022 0845   BUN 9 09/26/2017 1512   CREATININE 0.60 03/08/2022 0845   GLUCOSE 111 (H) 03/08/2022 0845   CALCIUM 8.0 (L) 03/08/2022 0845   AST 20 03/08/2022 0845   ALT 20 03/08/2022 0845   ALKPHOS 42 03/08/2022 0845   BILITOT 0.5 03/08/2022 0845   BILITOT <0.2 09/26/2017 1512   PROT 5.7 (L) 03/08/2022 0845   PROT 6.8 09/26/2017 1512   ALBUMIN 2.9 (L) 03/08/2022 0845   ALBUMIN 3.9 09/26/2017 1512    RADIOGRAPHIC STUDIES: DG Chest 2 View  Result Date: 03/05/2022 CLINICAL DATA:  Fever, breast cancer EXAM: CHEST - 2 VIEW COMPARISON:  12/27/2021 FINDINGS: Frontal and lateral views of the chest demonstrates stable left chest wall port. Cardiac silhouette is unremarkable. No acute airspace disease, effusion, or pneumothorax. No acute bony abnormalities. IMPRESSION: 1. No acute intrathoracic process. Electronically Signed   By: Randa Ngo M.D.   On: 03/05/2022 21:30    PERFORMANCE STATUS (ECOG) : 1 - Symptomatic but completely ambulatory  Review of  Systems Unless otherwise noted, a complete review of systems is negative.  Physical Exam General: NAD Cardiovascular: regular rate and rhythm Pulmonary: clear ant fields Abdomen: soft, nontender, + bowel sounds GU: no suprapubic tenderness Extremities: no edema, no joint deformities Skin: no rashes Neurological: Weakness but otherwise nonfocal  Assessment and Plan- Patient is a 46 y.o. female    Encounter Diagnoses  Name Primary?   Hypokalemia Yes   Paronychia of finger, right    Neutropenic fever (HCC)    Hypomagnesemia    Hypokalemia: Acute on chronic with a value of  2.9 today. Supplementing with IV potassium and sending in script for her to have at home as well. Return in 2 days for labs, +- IV potassium/mag  Hypomagnesemia: Acute with a value of 1.6 today. Supplementing with IV magnesium today. Loose stools slowing down and does not appear to be C. Diff. Discussed red flags. Follow up in 2 days for IV hydration, +- K/Mag  Neutropenic fever: Improved. Labs improved with WBC count today of 4.2 and ANC of 2.8. She is afebrile. Feeling better. She was not discharged with ABX given her negative blood cultures and improvement of symptoms. Watching her closely. RTC 2 days  Paronychia: Treated on 02/18/2022 with doxycycline. Finished treatment and area has resolved -nail may fall off but no purulent discharge remaining. No erythema.    Patient expressed understanding and was in agreement with this plan. She also understands that She can call clinic at any time with any questions, concerns, or complaints.   Thank you for allowing me to participate in the care of this very pleasant patient.   Time Total: 25  Visit consisted of counseling and education dealing with the complex and emotionally intense issues of symptom management in the setting of serious illness.Greater than 50%  of this time was spent counseling and coordinating care related to the above assessment and plan.  Signed by: Nelwyn Salisbury, PA-C

## 2022-03-10 ENCOUNTER — Other Ambulatory Visit: Payer: Self-pay

## 2022-03-10 ENCOUNTER — Inpatient Hospital Stay: Payer: 59

## 2022-03-10 ENCOUNTER — Inpatient Hospital Stay (HOSPITAL_BASED_OUTPATIENT_CLINIC_OR_DEPARTMENT_OTHER): Payer: 59 | Admitting: Medical Oncology

## 2022-03-10 VITALS — BP 103/55 | HR 87 | Temp 97.1°F | Resp 18

## 2022-03-10 DIAGNOSIS — Z170 Estrogen receptor positive status [ER+]: Secondary | ICD-10-CM

## 2022-03-10 DIAGNOSIS — D709 Neutropenia, unspecified: Secondary | ICD-10-CM

## 2022-03-10 DIAGNOSIS — R5081 Fever presenting with conditions classified elsewhere: Secondary | ICD-10-CM

## 2022-03-10 DIAGNOSIS — C50411 Malignant neoplasm of upper-outer quadrant of right female breast: Secondary | ICD-10-CM | POA: Diagnosis not present

## 2022-03-10 DIAGNOSIS — L03011 Cellulitis of right finger: Secondary | ICD-10-CM | POA: Diagnosis not present

## 2022-03-10 DIAGNOSIS — E860 Dehydration: Secondary | ICD-10-CM

## 2022-03-10 DIAGNOSIS — E876 Hypokalemia: Secondary | ICD-10-CM | POA: Diagnosis not present

## 2022-03-10 DIAGNOSIS — Z95828 Presence of other vascular implants and grafts: Secondary | ICD-10-CM

## 2022-03-10 DIAGNOSIS — K121 Other forms of stomatitis: Secondary | ICD-10-CM

## 2022-03-10 LAB — CULTURE, BLOOD (ROUTINE X 2)
Culture: NO GROWTH
Culture: NO GROWTH
Special Requests: ADEQUATE
Special Requests: ADEQUATE

## 2022-03-10 LAB — COMPREHENSIVE METABOLIC PANEL
ALT: 20 U/L (ref 0–44)
AST: 27 U/L (ref 15–41)
Albumin: 3 g/dL — ABNORMAL LOW (ref 3.5–5.0)
Alkaline Phosphatase: 49 U/L (ref 38–126)
Anion gap: 8 (ref 5–15)
BUN: 6 mg/dL (ref 6–20)
CO2: 25 mmol/L (ref 22–32)
Calcium: 8.4 mg/dL — ABNORMAL LOW (ref 8.9–10.3)
Chloride: 106 mmol/L (ref 98–111)
Creatinine, Ser: 0.62 mg/dL (ref 0.44–1.00)
GFR, Estimated: >60 mL/min (ref >60)
Glucose, Bld: 112 mg/dL — ABNORMAL HIGH (ref 70–99)
Potassium: 2.9 mmol/L — ABNORMAL LOW (ref 3.5–5.1)
Sodium: 139 mmol/L (ref 135–145)
Total Bilirubin: 0.3 mg/dL (ref 0.3–1.2)
Total Protein: 5.9 g/dL — ABNORMAL LOW (ref 6.5–8.1)

## 2022-03-10 LAB — CBC WITH DIFFERENTIAL/PLATELET
Abs Immature Granulocytes: 0.06 10*3/uL (ref 0.00–0.07)
Basophils Absolute: 0 10*3/uL (ref 0.0–0.1)
Basophils Relative: 1 %
Eosinophils Absolute: 0 10*3/uL (ref 0.0–0.5)
Eosinophils Relative: 0 %
HCT: 27.5 % — ABNORMAL LOW (ref 36.0–46.0)
Hemoglobin: 9 g/dL — ABNORMAL LOW (ref 12.0–15.0)
Immature Granulocytes: 1 %
Lymphocytes Relative: 25 %
Lymphs Abs: 1.2 10*3/uL (ref 0.7–4.0)
MCH: 33.5 pg (ref 26.0–34.0)
MCHC: 32.7 g/dL (ref 30.0–36.0)
MCV: 102.2 fL — ABNORMAL HIGH (ref 80.0–100.0)
Monocytes Absolute: 0.4 10*3/uL (ref 0.1–1.0)
Monocytes Relative: 8 %
Neutro Abs: 3 10*3/uL (ref 1.7–7.7)
Neutrophils Relative %: 65 %
Platelets: 89 10*3/uL — ABNORMAL LOW (ref 150–400)
RBC: 2.69 MIL/uL — ABNORMAL LOW (ref 3.87–5.11)
RDW: 16.8 % — ABNORMAL HIGH (ref 11.5–15.5)
WBC: 4.7 10*3/uL (ref 4.0–10.5)
nRBC: 0 % (ref 0.0–0.2)

## 2022-03-10 LAB — MAGNESIUM: Magnesium: 1.8 mg/dL (ref 1.7–2.4)

## 2022-03-10 MED ORDER — SODIUM CHLORIDE 0.9 % IV SOLN
Freq: Once | INTRAVENOUS | Status: AC
  Filled 2022-03-10: qty 250

## 2022-03-10 MED ORDER — POTASSIUM CHLORIDE 20 MEQ/100ML IV SOLN
20.0000 meq | Freq: Once | INTRAVENOUS | Status: AC
  Administered 2022-03-10: 20 meq via INTRAVENOUS

## 2022-03-10 MED ORDER — HEPARIN SOD (PORK) LOCK FLUSH 100 UNIT/ML IV SOLN
500.0000 [IU] | Freq: Once | INTRAVENOUS | Status: AC
  Administered 2022-03-10: 500 [IU] via INTRAVENOUS
  Filled 2022-03-10: qty 5

## 2022-03-10 MED ORDER — SODIUM CHLORIDE 0.9% FLUSH
10.0000 mL | Freq: Once | INTRAVENOUS | Status: AC
  Administered 2022-03-10: 10 mL via INTRAVENOUS
  Filled 2022-03-10: qty 10

## 2022-03-10 NOTE — Progress Notes (Signed)
Patient received 1 L IVF & 20 k meQ infusion. No questions/concerns voiced. Patient stable at discharge. AVS given.

## 2022-03-10 NOTE — Patient Instructions (Signed)
Potassium Chloride Injection What is this medication? POTASSIUM CHLORIDE (poe TASS i um KLOOR ide) prevents and treats low levels of potassium in your body. Potassium plays an important role in maintaining the health of your kidneys, heart, muscles, and nervous system. This medicine may be used for other purposes; ask your health care provider or pharmacist if you have questions. COMMON BRAND NAME(S): PROAMP What should I tell my care team before I take this medication? They need to know if you have any of these conditions: Addison disease Dehydration Diabetes (high blood sugar) Heart disease High levels of potassium in the blood Irregular heartbeat or rhythm Kidney disease Large areas of burned skin An unusual or allergic reaction to potassium, other medications, foods, dyes, or preservatives Pregnant or trying to get pregnant Breast-feeding How should I use this medication? This medication is injected into a vein. It is given in a hospital or clinic setting. Talk to your care team about the use of this medication in children. Special care may be needed. Overdosage: If you think you have taken too much of this medicine contact a poison control center or emergency room at once. NOTE: This medicine is only for you. Do not share this medicine with others. What if I miss a dose? This does not apply. This medication is not for regular use. What may interact with this medication? Do not take this medication with any of the following: Certain diuretics such as spironolactone, triamterene Eplerenone Sodium polystyrene sulfonate This medication may also interact with the following: Certain medications for blood pressure or heart disease like lisinopril, losartan, quinapril, valsartan Medications that lower your chance of fighting infection such as cyclosporine, tacrolimus NSAIDs, medications for pain and inflammation, like ibuprofen or naproxen Other potassium supplements Salt  substitutes This list may not describe all possible interactions. Give your health care provider a list of all the medicines, herbs, non-prescription drugs, or dietary supplements you use. Also tell them if you smoke, drink alcohol, or use illegal drugs. Some items may interact with your medicine. What should I watch for while using this medication? Visit your care team for regular checks on your progress. Tell your care team if your symptoms do not start to get better or if they get worse. You may need blood work while you are taking this medication. Avoid salt substitutes unless you are told otherwise by your care team. What side effects may I notice from receiving this medication? Side effects that you should report to your care team as soon as possible: Allergic reactions--skin rash, itching, hives, swelling of the face, lips, tongue, or throat High potassium level--muscle weakness, fast or irregular heartbeat Side effects that usually do not require medical attention (report to your care team if they continue or are bothersome): Diarrhea Nausea Stomach pain Vomiting This list may not describe all possible side effects. Call your doctor for medical advice about side effects. You may report side effects to FDA at 1-800-FDA-1088. Where should I keep my medication? This medication is given in a hospital or clinic. It will not be stored at home. NOTE: This sheet is a summary. It may not cover all possible information. If you have questions about this medicine, talk to your doctor, pharmacist, or health care provider.  2023 Elsevier/Gold Standard (2020-09-04 00:00:00)  

## 2022-03-10 NOTE — Progress Notes (Addendum)
Symptom Management [place] at [facility] [phone] [phone]  Patient Care Team: Latanya Maudlin, NP as PCP - General (Family Medicine) Daiva Huge, RN as Oncology Nurse Navigator Cammie Sickle, [name]   Name of the patient: Marilyn Page  [id number]  06/19/1975   Date of visit: 03/10/22  Reason for Consult: Marilyn Page is a 46 y.o. female who presents today for:  Dehydration: Patient is here today to follow up on dehydration, and recheck from hospitalization that she had from (03/05/2022-03/07/2022) for neutropenic fever with nausea. She had her hospital follow up on 03/08/2022 at which time she was given IVF for dehydration as well as K for hypokalemia.   Today she reports that she is feeling better overall. She reports more energy, more appetite, reduced loose stools and no nausea/vomiting. No fevers. Finger where she had paronychia continues to improve well and other fingers do not seem to be developing this same concern. Only new symptom is a blistery rash of her bottom lip. 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 Does not incorporate the tongue, palates of mouth or anywhere other than lip. She has tried OTC cold sore chap stick without much improvement. No trouble breathing/swallowing.    Denies any neurologic complaints. Denies recent fevers or illnesses. Denies any easy bleeding or bruising. Reports good appetite and denies weight loss. Denies chest pain. Denies any nausea, vomiting, constipation, or diarrhea. Denies urinary complaints. Patient offers no further specific complaints today.    PAST MEDICAL HISTORY: Past Medical History:  Diagnosis Date   Carcinoma of upper-outer quadrant of right breast in female, estrogen receptor positive ([place]) 10/26/2021   Diabetes mellitus without complication ([place])    History of gestational diabetes    Personal history of  chemotherapy     PAST SURGICAL HISTORY:  Past Surgical History:  Procedure Laterality Date   BREAST BIOPSY Right 10/19/2021   [place] Core bx 10:00 6 cmfn Ribbon Clip-INVASIVE MAMMARY CARCINOMA,. Axilla Butterfly hydro clip-MACRO METASTATIC MAMMARY CARCINOMA   CESAREAN SECTION  2005/2008   DILATION AND CURETTAGE OF UTERUS  2003   PORTACATH PLACEMENT Left 11/09/2021   Procedure: INSERTION PORT-A-CATH;  Surgeon: Robert Bellow, MD;  Location: [facility] ORS;  Service: General;  Laterality: Left;   SKIN BIOPSY Right 11/09/2021   Procedure: PUNCH BIOPSY SKIN, TATTOO LYMPH NODE RIGHT AXILLA;  Surgeon: Robert Bellow, MD;  Location: [facility] ORS;  Service: General;  Laterality: Right;    HEMATOLOGY/ONCOLOGY HISTORY:  Oncology History Overview Note  MAY 2023-    Targeted ultrasound is performed, showing a 2.9 x 1.9 x 2.0 cm irregular hypoechoic mass right breast 10 o'clock position 6 cm from nipple at the site of palpable concern.   There is an abnormal 8 mm thickened right axillary lymph node.   There is skin thickening overlying the right breast. ------------------  A. BREAST, RIGHT AT 10:00, 6 CM FROM THE NIPPLE; ULTRASOUND-GUIDED CORE  NEEDLE BIOPSY:  - INVASIVE MAMMARY CARCINOMA, NO SPECIAL TYPE.   Size of invasive carcinoma: 11 mm in this sample  Histologic grade of invasive carcinoma: Grade 2                       Glandular/tubular differentiation score: 3                       Nuclear pleomorphism score: 2  Mitotic rate score: 1                       Total score: 6  Ductal carcinoma in situ: Present, intermediate grade  Lymphovascular invasion: Not identified   B. LYMPH NODE, RIGHT AXILLARY; ULTRASOUND-GUIDED CORE NEEDLE BIOPSY:  - 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 IN GREATEST  EXTENT.   Comment:  The malignancy in the primary breast lesion appears morphologically  different from tumor in the lymph node sampling, with tumor present  in  lymph node more suggestive of a histologic grade 3, with significantly  increased mitotic activity and pleomorphism. Due to the discrepancy in  these morphologic patterns, ER/PR/HER2 immunohistochemistry will be  performed on both blocks A1 and B1, with reflex to [place] for HER2 2+. The  results will be reported in an addendum.   ADDENDUM:  CASE SUMMARY: BREAST BIOMARKER TESTS - A - BREAST, RIGHT AT 10:00  Estrogen Receptor (ER) Status: POSITIVE          Percentage of cells with nuclear positivity: 71-80%          Average intensity of staining: Strong   Progesterone Receptor (PgR) Status: POSITIVE          Percentage of cells with nuclear positivity: 81-90%          Average intensity of staining: Strong   HER2 (by immunohistochemistry): POSITIVE (Score 3+)       Percentage of cells with uniform intense complete membrane  staining: 61-70%  HER2 displays a heterogeneous staining pattern, with areas showing a  strong 3+ staining pattern, and other regions with complete absence (0+)  of staining.   Ki-67: Not performed   CASE SUMMARY: BREAST BIOMARKER TESTS - B - RIGHT AXILLARY LYMPH NODE  Estrogen Receptor (ER) Status: POSITIVE          Percentage of cells with nuclear positivity: 81-90%          Average intensity of staining: Strong   Progesterone Receptor (PgR) Status: POSITIVE          Percentage of cells with nuclear positivity: 51-60%          Average intensity of staining: Strong   HER2 (by immunohistochemistry): NEGATIVE (Score 1+)  Ki-67: Not performed   #  MAY 2023- STAGE III-T4N1 ER/PR positive HER2 POSITIVE right breast cancer;LN-positive-ER/PR positive however 2 NEGATIVE s/p biopsy [see discussion below].  Discussed with Dr. Sedalia Muta tumor heterogeneity. BREAST MRI- JUNE 2023- The patient's known malignancy in the upper outer quadrant of the right breast measures 3.8 x 2.8 x 3.6 cm. Numerous other suspicious masses are identified in the right breast as above  involving the upper outer and upper inner quadrants, located both anterior and posterior to the known malignancy.  Numerous enhancing foci in the skin as above are worrisome for skin metastases ; Bx proven. Biopsy proven metastatic node in the right axilla.  No evidence of malignancy in the left breast;  Two mildly prominent right internal mammary nodes were not FDG avid on recent PET-CT imaging.  Currently on neoadjuvant chemotherapy- TCH plus P x6 cycles;  MUGA scan May 31st, 2023- -61%.    # June 7th, 2023- NEO-ADJUVANT CHEMO- TCH+P   # Genetic counseling s/p- NEG for any deleterious mutations.     Carcinoma of upper-outer quadrant of right breast in female, estrogen receptor positive ([place])  10/26/2021 Initial Diagnosis   Carcinoma of upper-outer quadrant of right breast in female, estrogen  receptor positive ([place])   10/26/2021 Cancer Staging   Staging form: Breast, AJCC 8th Edition - Clinical: Stage IB (cT2, cN1, cM0, G2, ER+, PR+, HER2+) - Signed by Cammie Sickle, MD on 10/26/2021 Histologic grading system: 3 grade system   11/10/2021 -  Chemotherapy   Patient is on Treatment Plan : BREAST  Docetaxel + Carboplatin + Trastuzumab + Pertuzumab  (TCHP) q21d      11/11/2021 - 01/13/2022 Chemotherapy   Patient is on Treatment Plan : BREAST  Docetaxel + Carboplatin + Trastuzumab + Pertuzumab  (TCHP) q21d       Genetic Testing   Negative genetic testing. No pathogenic variants identified on the [facility] CancerNext-Expanded+RNA panel. The report date is 11/29/2021.  The CancerNext-Expanded + RNAinsight gene panel offered by [employer] and includes sequencing and rearrangement analysis for the following 77 genes: IP, ALK, APC*, ATM*, AXIN2, BAP1, BARD1, BLM, BMPR1A, BRCA1*, BRCA2*, BRIP1*, CDC73, CDH1*,CDK4, CDKN1B, CDKN2A, CHEK2*, CTNNA1, DICER1, FANCC, FH, FLCN, GALNT12, KIF1B, LZTR1, MAX, MEN1, MET, MLH1*, MSH2*, MSH3, MSH6*, MUTYH*, NBN, NF1*, NF2, NTHL1, PALB2*, PHOX2B, PMS2*, POT1, PRKAR1A,  PTCH1, PTEN*, RAD51C*, RAD51D*,RB1, RECQL, RET, SDHA, SDHAF2, SDHB, SDHC, SDHD, SMAD4, SMARCA4, SMARCB1, SMARCE1, STK11, SUFU, TMEM127, TP53*,TSC1, TSC2, VHL and XRCC2 (sequencing and deletion/duplication); EGFR, EGLN1, HOXB13, KIT, MITF, PDGFRA, POLD1 and POLE (sequencing only); EPCAM and GREM1 (deletion/duplication only).     ALLERGIES:  is allergic to metformin.  MEDICATIONS:  Current Outpatient Medications  Medication Sig Dispense Refill   acetaminophen (TYLENOL) 325 MG tablet Take 2 tablets (650 mg total) by mouth every 6 (six) hours as needed for mild pain (or Fever >/= 101).     alum & mag hydroxide-simeth (MAALOX/MYLANTA) 200-200-20 MG/5ML suspension Take 30 mLs by mouth every 4 (four) hours as needed for indigestion. 355 mL 0   ascorbic acid (VITAMIN C) 500 MG tablet Take 1 tablet (500 mg total) by mouth daily. 30 tablet    calcium-vitamin D (OSCAL WITH D) 500-5 MG-MCG tablet Take 1 tablet by mouth 2 (two) times daily. 60 tablet 2   Cyanocobalamin (VITAMIN B-12 PO) Take 1 tablet by mouth daily.     ferrous sulfate 325 (65 FE) MG tablet Take 325 mg by mouth 2 (two) times daily with a meal.     Ipratropium-Albuterol (COMBIVENT) 20-100 MCG/ACT AERS respimat Inhale 1 puff into the lungs every 6 (six) hours. (Patient taking differently: Inhale 1 puff into the lungs every 6 (six) hours as needed.) 4 g 0   lidocaine-prilocaine (EMLA) cream Apply on the port. 30 -45 min  prior to port access. 30 g 3   loperamide (IMODIUM) 2 MG capsule Take 1 capsule (2 mg total) by mouth as needed for diarrhea or loose stools. 30 capsule 0   magic mouthwash w/lidocaine SOLN Take 5 mLs by mouth 4 (four) times daily as needed for mouth pain. Sig: Swish/Swallow 5-10 ml four times a day as needed. Dispense 480 ml. 1RF 480 mL 1   Multiple Vitamin (MULTIVITAMIN WITH MINERALS) TABS tablet Take 1 tablet by mouth daily.     norethindrone (MICRONOR) 0.35 MG tablet Take 1 tablet (0.35 mg total) by mouth daily. 84 tablet 2    ondansetron (ZOFRAN-ODT) 8 MG disintegrating tablet Take 8 mg by mouth every 8 (eight) hours as needed for nausea or vomiting.     ONETOUCH VERIO test strip SMARTSIG:1 Each Via Meter Daily PRN     potassium chloride (KLOR-CON) 20 MEQ packet Take 20 mEq by mouth daily. 30 packet 3  prochlorperazine (COMPAZINE) 10 MG tablet Take 1 tablet (10 mg total) by mouth every 6 (six) hours as needed for nausea or vomiting. 40 tablet 1   Semaglutide, 1 MG/DOSE, 4 MG/3ML SOPN Inject into the skin. Inject 1 mg (0.75 ml) subcutaneously once a week.     No current facility-administered medications for this visit.   Facility-Administered Medications Ordered in Other Visits  Medication Dose Route Frequency Provider Last Rate Last Admin   0.9 %  sodium chloride infusion   Intravenous Once Hughie Closs, [place] 999 mL/hr at 03/10/22 0925 New Bag at 03/10/22 0925   sodium chloride flush (NS) 0.9 % injection 10 mL  10 mL Intravenous Once Jilliana Burkes M, PA-C        VITAL SIGNS: BP (!) 103/55   Pulse 87   Temp (!) 97.1 F (36.2 C) (Tympanic)   Resp 18  There were no vitals filed for this visit.   Estimated body mass index is 33.89 kg/m as calculated from the following:   Height as of 03/08/22: _0  (1.676 m).   Weight as of 03/08/22: 210 lb (95.3 kg).  LABS: CBC:    Component Value Date/Time   WBC 4.7 03/10/2022 0849   HGB 9.0 (L) 03/10/2022 0849   HGB 7.8 (L) 04/23/2021 1431   HCT 27.5 (L) 03/10/2022 0849   HCT 25.5 (L) 04/23/2021 1431   PLT 89 (L) 03/10/2022 0849   PLT 384 04/23/2021 1431   MCV 102.2 (H) 03/10/2022 0849   MCV 84 04/23/2021 1431   NEUTROABS 3.0 03/10/2022 0849   NEUTROABS 5.4 04/23/2021 1431   LYMPHSABS 1.2 03/10/2022 0849   LYMPHSABS 2.2 04/23/2021 1431   MONOABS 0.4 03/10/2022 0849   EOSABS 0.0 03/10/2022 0849   EOSABS 0.2 04/23/2021 1431   BASOSABS 0.0 03/10/2022 0849   BASOSABS 0.1 04/23/2021 1431   Comprehensive Metabolic Panel:    Component Value  Date/Time   NA 139 03/10/2022 0849   NA 143 09/26/2017 1512   K 2.9 (L) 03/10/2022 0849   CL 106 03/10/2022 0849   CO2 25 03/10/2022 0849   BUN 6 03/10/2022 0849   BUN 9 09/26/2017 1512   CREATININE 0.62 03/10/2022 0849   GLUCOSE 112 (H) 03/10/2022 0849   CALCIUM 8.4 (L) 03/10/2022 0849   AST 27 03/10/2022 0849   ALT 20 03/10/2022 0849   ALKPHOS 49 03/10/2022 0849   BILITOT 0.3 03/10/2022 0849   BILITOT <0.2 09/26/2017 1512   PROT 5.9 (L) 03/10/2022 0849   PROT 6.8 09/26/2017 1512   ALBUMIN 3.0 (L) 03/10/2022 0849   ALBUMIN 3.9 09/26/2017 1512    RADIOGRAPHIC STUDIES: DG Chest 2 View  Result Date: 03/05/2022 CLINICAL DATA:  Fever, breast cancer EXAM: CHEST - 2 VIEW COMPARISON:  12/27/2021 FINDINGS: Frontal and lateral views of the chest demonstrates stable left chest wall port. Cardiac silhouette is unremarkable. No acute airspace disease, effusion, or pneumothorax. No acute bony abnormalities. IMPRESSION: 1. No acute intrathoracic process. Electronically Signed   By: Randa Ngo M.D.   On: 03/05/2022 21:30    PERFORMANCE STATUS (ECOG) : 1 - Symptomatic but completely ambulatory  Review of Systems Unless otherwise noted, a complete review of systems is negative.  Physical Exam General: NAD Cardiovascular: regular rate and rhythm Pulmonary: clear ant fields Abdomen: soft, nontender, + bowel sounds GU: no suprapubic tenderness Extremities: no edema, no joint deformities Skin: no rashes Neurological: Weakness but otherwise nonfocal    Assessment and Plan- Patient is a 46 y.o.  female    Encounter Diagnoses  Name Primary?   Carcinoma of upper-outer quadrant of right breast in female, estrogen receptor positive ([place]) Yes   Hypokalemia    Hypomagnesemia    Paronychia of finger, right    Neutropenic fever (HCC)    Dehydration    Stomatitis    Breast Cancer: Chronic. Has completed her neoadjuvant chemotherapy TCH+P x 3. Has appointment tomorrow with general surgery  Dr. Terri Piedra and has follow up CT scans on Monday.   Hypokalemia: Acute on chronic. Again with a value of 2.9 today. Supplementing with IV potassium (20 MEQ) Return in 2 days for labs, +- IV potassium/mag  Hypomagnesemia: Normalized as of today. Will continue to monitor.   Paronychia: Treated on 02/18/2022 with doxycycline. Continues to heal well without evidence of recurrence. Will continue to monitor.   Neutropenic fever: Continue to improve both in symptoms and lab values. Today her CBC shows continued improvement with WBC of 4.7, ANC of 3.0. Afebrile. Feeling better. Will continue to monitor.   Dehydration: Chronic secondary to lower oral intake and effects of chemotherapy. 1l IVF to be given today. Lactic acid to be rechecked at follow up from her hospitalization to ensure it has normalized as expected- trending down.   Stomatitis: New. Appears secondary to chemotherapy. Has magic mouthwash at home. Will also trial aquaphor instead of chap stick. States that the area is not very painful. Discussed red flags. Her MCV is also elevated so we will check a B12 level at next follow up to ensure this is not contributing.   Patient expressed understanding and was in agreement with this plan. She also understands that She can call clinic at any time with any questions, concerns, or complaints.   Thank you for allowing me to participate in the care of this very pleasant patient.   Time Total: 25  Visit consisted of counseling and education dealing with the complex and emotionally intense issues of symptom management in the setting of serious illness.Greater than 50%  of this time was spent counseling and coordinating care related to the above assessment and plan.  Signed by: Nelwyn Salisbury, PA-C

## 2022-03-11 ENCOUNTER — Other Ambulatory Visit: Payer: Self-pay | Admitting: *Deleted

## 2022-03-11 ENCOUNTER — Other Ambulatory Visit: Payer: Self-pay | Admitting: General Surgery

## 2022-03-11 DIAGNOSIS — D709 Neutropenia, unspecified: Secondary | ICD-10-CM

## 2022-03-11 DIAGNOSIS — C50411 Malignant neoplasm of upper-outer quadrant of right female breast: Secondary | ICD-10-CM

## 2022-03-11 DIAGNOSIS — D500 Iron deficiency anemia secondary to blood loss (chronic): Secondary | ICD-10-CM

## 2022-03-12 ENCOUNTER — Inpatient Hospital Stay: Payer: 59

## 2022-03-12 ENCOUNTER — Inpatient Hospital Stay (HOSPITAL_BASED_OUTPATIENT_CLINIC_OR_DEPARTMENT_OTHER): Payer: 59 | Admitting: Medical Oncology

## 2022-03-12 ENCOUNTER — Encounter: Payer: Self-pay | Admitting: Medical Oncology

## 2022-03-12 VITALS — BP 110/67 | HR 98 | Temp 97.6°F | Resp 16 | Wt 210.0 lb

## 2022-03-12 VITALS — BP 111/65 | HR 79 | Resp 16

## 2022-03-12 DIAGNOSIS — E876 Hypokalemia: Secondary | ICD-10-CM | POA: Diagnosis not present

## 2022-03-12 DIAGNOSIS — Z95828 Presence of other vascular implants and grafts: Secondary | ICD-10-CM

## 2022-03-12 DIAGNOSIS — D709 Neutropenia, unspecified: Secondary | ICD-10-CM

## 2022-03-12 DIAGNOSIS — Z170 Estrogen receptor positive status [ER+]: Secondary | ICD-10-CM

## 2022-03-12 DIAGNOSIS — C50411 Malignant neoplasm of upper-outer quadrant of right female breast: Secondary | ICD-10-CM | POA: Diagnosis not present

## 2022-03-12 DIAGNOSIS — L03011 Cellulitis of right finger: Secondary | ICD-10-CM

## 2022-03-12 DIAGNOSIS — D500 Iron deficiency anemia secondary to blood loss (chronic): Secondary | ICD-10-CM

## 2022-03-12 DIAGNOSIS — E860 Dehydration: Secondary | ICD-10-CM

## 2022-03-12 DIAGNOSIS — K121 Other forms of stomatitis: Secondary | ICD-10-CM

## 2022-03-12 DIAGNOSIS — R5081 Fever presenting with conditions classified elsewhere: Secondary | ICD-10-CM

## 2022-03-12 LAB — COMPREHENSIVE METABOLIC PANEL
ALT: 22 U/L (ref 0–44)
AST: 26 U/L (ref 15–41)
Albumin: 2.9 g/dL — ABNORMAL LOW (ref 3.5–5.0)
Alkaline Phosphatase: 53 U/L (ref 38–126)
Anion gap: 5 (ref 5–15)
BUN: 5 mg/dL — ABNORMAL LOW (ref 6–20)
CO2: 26 mmol/L (ref 22–32)
Calcium: 8.8 mg/dL — ABNORMAL LOW (ref 8.9–10.3)
Chloride: 108 mmol/L (ref 98–111)
Creatinine, Ser: 0.66 mg/dL (ref 0.44–1.00)
GFR, Estimated: >60 mL/min (ref >60)
Glucose, Bld: 158 mg/dL — ABNORMAL HIGH (ref 70–99)
Potassium: 2.7 mmol/L — CL (ref 3.5–5.1)
Sodium: 139 mmol/L (ref 135–145)
Total Bilirubin: 0.5 mg/dL (ref 0.3–1.2)
Total Protein: 5.7 g/dL — ABNORMAL LOW (ref 6.5–8.1)

## 2022-03-12 LAB — CBC WITH DIFFERENTIAL/PLATELET
Abs Immature Granulocytes: 0.06 10*3/uL (ref 0.00–0.07)
Basophils Absolute: 0 10*3/uL (ref 0.0–0.1)
Basophils Relative: 0 %
Eosinophils Absolute: 0 10*3/uL (ref 0.0–0.5)
Eosinophils Relative: 0 %
HCT: 26.8 % — ABNORMAL LOW (ref 36.0–46.0)
Hemoglobin: 8.8 g/dL — ABNORMAL LOW (ref 12.0–15.0)
Immature Granulocytes: 1 %
Lymphocytes Relative: 28 %
Lymphs Abs: 1.4 10*3/uL (ref 0.7–4.0)
MCH: 33.7 pg (ref 26.0–34.0)
MCHC: 32.8 g/dL (ref 30.0–36.0)
MCV: 102.7 fL — ABNORMAL HIGH (ref 80.0–100.0)
Monocytes Absolute: 0.3 10*3/uL (ref 0.1–1.0)
Monocytes Relative: 6 %
Neutro Abs: 3.3 10*3/uL (ref 1.7–7.7)
Neutrophils Relative %: 65 %
Platelets: 85 10*3/uL — ABNORMAL LOW (ref 150–400)
RBC: 2.61 MIL/uL — ABNORMAL LOW (ref 3.87–5.11)
RDW: 17.1 % — ABNORMAL HIGH (ref 11.5–15.5)
WBC: 5 10*3/uL (ref 4.0–10.5)
nRBC: 0 % (ref 0.0–0.2)

## 2022-03-12 LAB — LACTIC ACID, PLASMA: Lactic Acid, Venous: 1.8 mmol/L (ref 0.5–1.9)

## 2022-03-12 LAB — VITAMIN B12: Vitamin B-12: 4101 pg/mL — ABNORMAL HIGH (ref 180–914)

## 2022-03-12 LAB — MAGNESIUM: Magnesium: 1.4 mg/dL — ABNORMAL LOW (ref 1.7–2.4)

## 2022-03-12 MED ORDER — SODIUM CHLORIDE 0.9 % IV SOLN
INTRAVENOUS | Status: DC
  Filled 2022-03-12 (×2): qty 250

## 2022-03-12 MED ORDER — SODIUM CHLORIDE 0.9% FLUSH
10.0000 mL | Freq: Once | INTRAVENOUS | Status: AC
  Administered 2022-03-12: 10 mL via INTRAVENOUS
  Filled 2022-03-12: qty 10

## 2022-03-12 MED ORDER — MAGNESIUM SULFATE 4 GM/100ML IV SOLN
4.0000 g | Freq: Once | INTRAVENOUS | Status: AC
  Administered 2022-03-12: 4 g via INTRAVENOUS
  Filled 2022-03-12: qty 100

## 2022-03-12 MED ORDER — HEPARIN SOD (PORK) LOCK FLUSH 100 UNIT/ML IV SOLN
500.0000 [IU] | Freq: Once | INTRAVENOUS | Status: AC
  Administered 2022-03-12: 500 [IU]
  Filled 2022-03-12: qty 5

## 2022-03-12 MED ORDER — SODIUM CHLORIDE 0.9 % IV SOLN
40.0000 meq | Freq: Once | INTRAVENOUS | Status: AC
  Administered 2022-03-12: 40 meq via INTRAVENOUS
  Filled 2022-03-12: qty 20

## 2022-03-12 MED ORDER — POTASSIUM CHLORIDE 20 MEQ PO PACK: 20.0000 meq | PACK | Freq: Two times a day (BID) | ORAL | 3 refills | Status: DC

## 2022-03-12 NOTE — Progress Notes (Signed)
Symptom Management [place] at [facility] [phone] [phone]  Patient Care Team: Latanya Maudlin, NP as PCP - General (Family Medicine) Daiva Huge, RN as Oncology Nurse Navigator Cammie Sickle, [name]   Name of the patient: Marilyn Page  [id number]  Jun 03, 1976   Date of visit: 03/12/22  Reason for Consult: Marilyn Page is a 47 y.o. female who presents today for:  Dehydration: Patient is here today to follow up on dehydration, follow up on stomaitis, and recheck from hospitalization that she had from (03/05/2022-03/07/2022) for neutropenic fever with nausea. She had her hospital follow up on 03/08/2022 at which time she was given IVF for dehydration as well as K for hypokalemia.   Today she reports that she continues to feel better. Still has some fatigue but denies any fevers, abdominal pain, N/V or constipation. 3 episodes of non-formed stools. Improving to imodium. Her Stomatitis has improved and is starting to heal with Aquaphor application. Had visit with Dr. Terri Page and is waiting on appointment plastics.   Wt Readings from Last 3 Encounters:  03/12/22 210 lb (95.3 kg)  03/08/22 210 lb (95.3 kg)  03/07/22 214 lb 11.7 oz (97.4 kg)     Denies any neurologic complaints. Denies recent fevers or illnesses. Denies any easy bleeding or bruising. Reports good appetite and denies weight loss. Denies chest pain. Denies any nausea, vomiting, constipation. Denies urinary complaints. Patient offers no further specific complaints today.    PAST MEDICAL HISTORY: Past Medical History:  Diagnosis Date   Carcinoma of upper-outer quadrant of right breast in female, estrogen receptor positive ([place]) 10/26/2021   Diabetes mellitus without complication ([place])    History of gestational diabetes    Personal history of chemotherapy     PAST SURGICAL HISTORY:  Past Surgical History:  Procedure  Laterality Date   BREAST BIOPSY Right 10/19/2021   [place] Core bx 10:00 6 cmfn Ribbon Clip-INVASIVE MAMMARY CARCINOMA,. Axilla Butterfly hydro clip-MACRO METASTATIC MAMMARY CARCINOMA   CESAREAN SECTION  2005/2008   DILATION AND CURETTAGE OF UTERUS  2003   PORTACATH PLACEMENT Left 11/09/2021   Procedure: INSERTION PORT-A-CATH;  Surgeon: Robert Bellow, MD;  Location: [facility] ORS;  Service: General;  Laterality: Left;   SKIN BIOPSY Right 11/09/2021   Procedure: PUNCH BIOPSY SKIN, TATTOO LYMPH NODE RIGHT AXILLA;  Surgeon: Robert Bellow, MD;  Location: [facility] ORS;  Service: General;  Laterality: Right;    HEMATOLOGY/ONCOLOGY HISTORY:  Oncology History Overview Note  MAY 2023-    Targeted ultrasound is performed, showing a 2.9 x 1.9 x 2.0 cm irregular hypoechoic mass right breast 10 o'clock position 6 cm from nipple at the site of palpable concern.   There is an abnormal 8 mm thickened right axillary lymph node.   There is skin thickening overlying the right breast. ------------------  A. BREAST, RIGHT AT 10:00, 6 CM FROM THE NIPPLE; ULTRASOUND-GUIDED CORE  NEEDLE BIOPSY:  - INVASIVE MAMMARY CARCINOMA, NO SPECIAL TYPE.   Size of invasive carcinoma: 11 mm in this sample  Histologic grade of invasive carcinoma: Grade 2                       Glandular/tubular differentiation score: 3                       Nuclear pleomorphism score: 2  Mitotic rate score: 1                       Total score: 6  Ductal carcinoma in situ: Present, intermediate grade  Lymphovascular invasion: Not identified   B. LYMPH NODE, RIGHT AXILLARY; ULTRASOUND-GUIDED CORE NEEDLE BIOPSY:  - 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 IN GREATEST  EXTENT.   Comment:  The malignancy in the primary breast lesion appears morphologically  different from tumor in the lymph node sampling, with tumor present in  lymph node more suggestive of a histologic grade 3, with significantly   increased mitotic activity and pleomorphism. Due to the discrepancy in  these morphologic patterns, ER/PR/HER2 immunohistochemistry will be  performed on both blocks A1 and B1, with reflex to [place] for HER2 2+. The  results will be reported in an addendum.   ADDENDUM:  CASE SUMMARY: BREAST BIOMARKER TESTS - A - BREAST, RIGHT AT 10:00  Estrogen Receptor (ER) Status: POSITIVE          Percentage of cells with nuclear positivity: 71-80%          Average intensity of staining: Strong   Progesterone Receptor (PgR) Status: POSITIVE          Percentage of cells with nuclear positivity: 81-90%          Average intensity of staining: Strong   HER2 (by immunohistochemistry): POSITIVE (Score 3+)       Percentage of cells with uniform intense complete membrane  staining: 61-70%  HER2 displays a heterogeneous staining pattern, with areas showing a  strong 3+ staining pattern, and other regions with complete absence (0+)  of staining.   Ki-67: Not performed   CASE SUMMARY: BREAST BIOMARKER TESTS - B - RIGHT AXILLARY LYMPH NODE  Estrogen Receptor (ER) Status: POSITIVE          Percentage of cells with nuclear positivity: 81-90%          Average intensity of staining: Strong   Progesterone Receptor (PgR) Status: POSITIVE          Percentage of cells with nuclear positivity: 51-60%          Average intensity of staining: Strong   HER2 (by immunohistochemistry): NEGATIVE (Score 1+)  Ki-67: Not performed   #  MAY 2023- STAGE III-T4N1 ER/PR positive HER2 POSITIVE right breast cancer;LN-positive-ER/PR positive however 2 NEGATIVE s/p biopsy [see discussion below].  Discussed with Dr. [place] Muta tumor heterogeneity. BREAST MRI- JUNE 2023- The patient's known malignancy in the upper outer quadrant of the right breast measures 3.8 x 2.8 x 3.6 cm. Numerous other suspicious masses are identified in the right breast as above involving the upper outer and upper inner quadrants, located both anterior and  posterior to the known malignancy.  Numerous enhancing foci in the skin as above are worrisome for skin metastases ; Bx proven. Biopsy proven metastatic node in the right axilla.  No evidence of malignancy in the left breast;  Two mildly prominent right internal mammary nodes were not FDG avid on recent PET-CT imaging.  Currently on neoadjuvant chemotherapy- TCH plus P x6 cycles;  MUGA scan May 31st, 2023- -61%.    # June 7th, 2023- NEO-ADJUVANT CHEMO- TCH+P   # Genetic counseling s/p- NEG for any deleterious mutations.     Carcinoma of upper-outer quadrant of right breast in female, estrogen receptor positive ([place])  10/26/2021 Initial Diagnosis   Carcinoma of upper-outer quadrant of right breast in female, estrogen  receptor positive ([place])   10/26/2021 Cancer Staging   Staging form: Breast, AJCC 8th Edition - Clinical: Stage IB (cT2, cN1, cM0, G2, ER+, PR+, HER2+) - Signed by Cammie Sickle, MD on 10/26/2021 Histologic grading system: 3 grade system   11/10/2021 -  Chemotherapy   Patient is on Treatment Plan : BREAST  Docetaxel + Carboplatin + Trastuzumab + Pertuzumab  (TCHP) q21d      11/11/2021 - 01/13/2022 Chemotherapy   Patient is on Treatment Plan : BREAST  Docetaxel + Carboplatin + Trastuzumab + Pertuzumab  (TCHP) q21d       Genetic Testing   Negative genetic testing. No pathogenic variants identified on the [facility] CancerNext-Expanded+RNA panel. The report date is 11/29/2021.  The CancerNext-Expanded + RNAinsight gene panel offered by [employer] and includes sequencing and rearrangement analysis for the following 77 genes: IP, ALK, APC*, ATM*, AXIN2, BAP1, BARD1, BLM, BMPR1A, BRCA1*, BRCA2*, BRIP1*, CDC73, CDH1*,CDK4, CDKN1B, CDKN2A, CHEK2*, CTNNA1, DICER1, FANCC, FH, FLCN, GALNT12, KIF1B, LZTR1, MAX, MEN1, MET, MLH1*, MSH2*, MSH3, MSH6*, MUTYH*, NBN, NF1*, NF2, NTHL1, PALB2*, PHOX2B, PMS2*, POT1, PRKAR1A, PTCH1, PTEN*, RAD51C*, RAD51D*,RB1, RECQL, RET, SDHA, SDHAF2, SDHB, SDHC, SDHD,  SMAD4, SMARCA4, SMARCB1, SMARCE1, STK11, SUFU, TMEM127, TP53*,TSC1, TSC2, VHL and XRCC2 (sequencing and deletion/duplication); EGFR, EGLN1, HOXB13, KIT, MITF, PDGFRA, POLD1 and POLE (sequencing only); EPCAM and GREM1 (deletion/duplication only).     ALLERGIES:  is allergic to metformin.  MEDICATIONS:  Current Outpatient Medications  Medication Sig Dispense Refill   acetaminophen (TYLENOL) 325 MG tablet Take 2 tablets (650 mg total) by mouth every 6 (six) hours as needed for mild pain (or Fever >/= 101).     ascorbic acid (VITAMIN C) 500 MG tablet Take 1 tablet (500 mg total) by mouth daily. 30 tablet    calcium-vitamin D (OSCAL WITH D) 500-5 MG-MCG tablet Take 1 tablet by mouth 2 (two) times daily. 60 tablet 2   Cyanocobalamin (VITAMIN B-12 PO) Take 1 tablet by mouth daily.     ferrous sulfate 325 (65 FE) MG tablet Take 325 mg by mouth 2 (two) times daily with a meal.     Ipratropium-Albuterol (COMBIVENT) 20-100 MCG/ACT AERS respimat Inhale 1 puff into the lungs every 6 (six) hours. (Patient taking differently: Inhale 1 puff into the lungs every 6 (six) hours as needed.) 4 g 0   lidocaine-prilocaine (EMLA) cream Apply on the port. 30 -45 min  prior to port access. 30 g 3   loperamide (IMODIUM) 2 MG capsule Take 1 capsule (2 mg total) by mouth as needed for diarrhea or loose stools. 30 capsule 0   magic mouthwash w/lidocaine SOLN Take 5 mLs by mouth 4 (four) times daily as needed for mouth pain. Sig: Swish/Swallow 5-10 ml four times a day as needed. Dispense 480 ml. 1RF 480 mL 1   Multiple Vitamin (MULTIVITAMIN WITH MINERALS) TABS tablet Take 1 tablet by mouth daily.     norethindrone (MICRONOR) 0.35 MG tablet Take 1 tablet (0.35 mg total) by mouth daily. 84 tablet 2   ondansetron (ZOFRAN-ODT) 8 MG disintegrating tablet Take 8 mg by mouth every 8 (eight) hours as needed for nausea or vomiting.     ONETOUCH VERIO test strip SMARTSIG:1 Each Via Meter Daily PRN     Semaglutide, 1 MG/DOSE, 4 MG/3ML  SOPN Inject into the skin. Inject 1 mg (0.75 ml) subcutaneously once a week.     alum & mag hydroxide-simeth (MAALOX/MYLANTA) 200-200-20 MG/5ML suspension Take 30 mLs by mouth every 4 (four) hours as needed  for indigestion. (Patient not taking: Reported on 03/12/2022) 355 mL 0   potassium chloride (KLOR-CON) 20 MEQ packet Take 20 mEq by mouth 2 (two) times daily. 60 packet 3   prochlorperazine (COMPAZINE) 10 MG tablet Take 1 tablet (10 mg total) by mouth every 6 (six) hours as needed for nausea or vomiting. (Patient not taking: Reported on 03/12/2022) 40 tablet 1   No current facility-administered medications for this visit.   Facility-Administered Medications Ordered in Other Visits  Medication Dose Route Frequency Provider Last Rate Last Admin   0.9 %  sodium chloride infusion   Intravenous Continuous Hughie Closs, PA-C   Stopped at 03/12/22 1025    VITAL SIGNS: BP 110/67 (BP Location: Right Arm, Patient Position: Sitting)   Pulse 98   Temp 97.6 F (36.4 C) (Tympanic)   Resp 16   Wt 210 lb (95.3 kg)   SpO2 100%   BMI 33.89 kg/m  Filed Weights   03/12/22 0909  Weight: 210 lb (95.3 kg)     Estimated body mass index is 33.89 kg/m as calculated from the following:   Height as of 03/08/22: _0  (1.676 m).   Weight as of this encounter: 210 lb (95.3 kg).  LABS: CBC:    Component Value Date/Time   WBC 5.0 03/12/2022 0926   HGB 8.8 (L) 03/12/2022 0926   HGB 7.8 (L) 04/23/2021 1431   HCT 26.8 (L) 03/12/2022 0926   HCT 25.5 (L) 04/23/2021 1431   PLT 85 (L) 03/12/2022 0926   PLT 384 04/23/2021 1431   MCV 102.7 (H) 03/12/2022 0926   MCV 84 04/23/2021 1431   NEUTROABS 3.3 03/12/2022 0926   NEUTROABS 5.4 04/23/2021 1431   LYMPHSABS 1.4 03/12/2022 0926   LYMPHSABS 2.2 04/23/2021 1431   MONOABS 0.3 03/12/2022 0926   EOSABS 0.0 03/12/2022 0926   EOSABS 0.2 04/23/2021 1431   BASOSABS 0.0 03/12/2022 0926   BASOSABS 0.1 04/23/2021 1431   Comprehensive Metabolic Panel:     Component Value Date/Time   NA 139 03/12/2022 0926   NA 143 09/26/2017 1512   K 2.7 (LL) 03/12/2022 0926   CL 108 03/12/2022 0926   CO2 26 03/12/2022 0926   BUN 5 (L) 03/12/2022 0926   BUN 9 09/26/2017 1512   CREATININE 0.66 03/12/2022 0926   GLUCOSE 158 (H) 03/12/2022 0926   CALCIUM 8.8 (L) 03/12/2022 0926   AST 26 03/12/2022 0926   ALT 22 03/12/2022 0926   ALKPHOS 53 03/12/2022 0926   BILITOT 0.5 03/12/2022 0926   BILITOT <0.2 09/26/2017 1512   PROT 5.7 (L) 03/12/2022 0926   PROT 6.8 09/26/2017 1512   ALBUMIN 2.9 (L) 03/12/2022 0926   ALBUMIN 3.9 09/26/2017 1512    RADIOGRAPHIC STUDIES: DG Chest 2 View  Result Date: 03/05/2022 CLINICAL DATA:  Fever, breast cancer EXAM: CHEST - 2 VIEW COMPARISON:  12/27/2021 FINDINGS: Frontal and lateral views of the chest demonstrates stable left chest wall port. Cardiac silhouette is unremarkable. No acute airspace disease, effusion, or pneumothorax. No acute bony abnormalities. IMPRESSION: 1. No acute intrathoracic process. Electronically Signed   By: Randa Ngo M.D.   On: 03/05/2022 21:30    PERFORMANCE STATUS (ECOG) : 1 - Symptomatic but completely ambulatory  Review of Systems Unless otherwise noted, a complete review of systems is negative.  Physical Exam General: NAD Cardiovascular: regular rate and rhythm Pulmonary: clear ant fields Abdomen: soft, nontender, + bowel sounds GU: no suprapubic tenderness Extremities: no edema, no joint deformities Skin: no  rashes Neurological: Weakness but otherwise nonfocal 03/10/2022 (Below)   03/12/2022 (Above) Assessment and Plan- Patient is a 46 y.o. female    Encounter Diagnoses  Name Primary?   Carcinoma of upper-outer quadrant of right breast in female, estrogen receptor positive ([place]) Yes   Hypokalemia    Hypomagnesemia    Paronychia of finger, right    Neutropenic fever (H[facility])    Dehydration    Stomatitis    Breast Cancer: Chronic. Has completed her neoadjuvant  chemotherapy TCH+P x 3. Had appointment with Dr. Terri Page with plan of meeting with plastics to determine if she is going to have 1 surgery or 2. Plan is for a Right mastectomy. Has follow up CT scans on Monday.   Hypokalemia: Acute on chronic. Value of 2.7 today. Supplementing with IV potassium (40 MEQ)  and 4 mg magnesium. Increasing her daily supplement from Daily to BID. Return in 2 days for labs, +- IV potassium/mag  Hypomagnesemia: Acute on chronic. Likely secondary to GI loss. See above.   Paronychia: Treated on 02/18/2022 with doxycycline. Again, she continues to heal well without evidence of recurrence. Will continue to monitor.   Neutropenic fever: Continue to improve both in symptoms and lab values. Today her CBC shows continued improvement with WBC of 5.0 ANC of 3.3. Afebrile. Feeling better. Will continue to monitor.   Dehydration: Chronic secondary to lower oral intake, GI loss and effects of chemotherapy. IVF given today.   Stomatitis: Resolving. Appears secondary to chemotherapy.Starting to heal. Not painful. Continue using Aquaphor PRN.   Loose Stool: Has continued. # episodes since last visit 2 days ago. Non-bloody. Imodium seems to be working well. Recently treated with ABX that would cover for C. Diff. With only three episodes since last visit this is low in differential however if loose stools worsen or continue we will assess further for C. Diff. Pt alerted and dicussed. Will continue to watch and hydrate. Discussed red flags.   Anemia: Chronic waxes and wanes likely secondary to chemotherapy. Given macrocytosis obtained a B12 level today on labs which is pending. Will monitor.   Patient expressed understanding and was in agreement with this plan. She also understands that She can call clinic at any time with any questions, concerns, or complaints.   Thank you for allowing me to participate in the care of this very pleasant patient.   Time Total: 25  Visit consisted of  counseling and education dealing with the complex and emotionally intense issues of symptom management in the setting of serious illness.Greater than 50%  of this time was spent counseling and coordinating care related to the above assessment and plan.  Signed by: Nelwyn Salisbury, PA-C

## 2022-03-12 NOTE — Progress Notes (Signed)
Pt in for follow up labs and possible fluids. Reports had loose stools once yesterday and this morning.  States she took imodium after each episode.

## 2022-03-15 ENCOUNTER — Telehealth: Payer: Self-pay | Admitting: *Deleted

## 2022-03-15 ENCOUNTER — Inpatient Hospital Stay: Payer: 59

## 2022-03-15 ENCOUNTER — Inpatient Hospital Stay (HOSPITAL_BASED_OUTPATIENT_CLINIC_OR_DEPARTMENT_OTHER): Payer: 59 | Admitting: Medical Oncology

## 2022-03-15 ENCOUNTER — Encounter: Payer: Self-pay | Admitting: Medical Oncology

## 2022-03-15 ENCOUNTER — Inpatient Hospital Stay: Payer: 59 | Admitting: Medical Oncology

## 2022-03-15 ENCOUNTER — Ambulatory Visit
Admission: RE | Admit: 2022-03-15 | Discharge: 2022-03-15 | Disposition: A | Discharge status: Home / Self Care | Payer: 59 | Source: Ambulatory Visit | Attending: Nurse Practitioner | Admitting: Nurse Practitioner

## 2022-03-15 VITALS — BP 111/75 | HR 85 | Temp 98.2°F | Resp 18 | Wt 218.2 lb

## 2022-03-15 DIAGNOSIS — K121 Other forms of stomatitis: Secondary | ICD-10-CM

## 2022-03-15 DIAGNOSIS — E876 Hypokalemia: Secondary | ICD-10-CM

## 2022-03-15 DIAGNOSIS — Z170 Estrogen receptor positive status [ER+]: Secondary | ICD-10-CM | POA: Diagnosis present

## 2022-03-15 DIAGNOSIS — L03011 Cellulitis of right finger: Secondary | ICD-10-CM

## 2022-03-15 DIAGNOSIS — D500 Iron deficiency anemia secondary to blood loss (chronic): Secondary | ICD-10-CM

## 2022-03-15 DIAGNOSIS — D709 Neutropenia, unspecified: Secondary | ICD-10-CM

## 2022-03-15 DIAGNOSIS — C50411 Malignant neoplasm of upper-outer quadrant of right female breast: Secondary | ICD-10-CM

## 2022-03-15 DIAGNOSIS — R5081 Fever presenting with conditions classified elsewhere: Secondary | ICD-10-CM

## 2022-03-15 DIAGNOSIS — E860 Dehydration: Secondary | ICD-10-CM

## 2022-03-15 LAB — COMPREHENSIVE METABOLIC PANEL
ALT: 20 U/L (ref 0–44)
AST: 33 U/L (ref 15–41)
Albumin: 2.9 g/dL — ABNORMAL LOW (ref 3.5–5.0)
Alkaline Phosphatase: 51 U/L (ref 38–126)
Anion gap: 9 (ref 5–15)
BUN: 7 mg/dL (ref 6–20)
CO2: 25 mmol/L (ref 22–32)
Calcium: 8.7 mg/dL — ABNORMAL LOW (ref 8.9–10.3)
Chloride: 104 mmol/L (ref 98–111)
Creatinine, Ser: 0.64 mg/dL (ref 0.44–1.00)
GFR, Estimated: >60 mL/min (ref >60)
Glucose, Bld: 129 mg/dL — ABNORMAL HIGH (ref 70–99)
Potassium: 3.4 mmol/L — ABNORMAL LOW (ref 3.5–5.1)
Sodium: 138 mmol/L (ref 135–145)
Total Bilirubin: 0.4 mg/dL (ref 0.3–1.2)
Total Protein: 5.8 g/dL — ABNORMAL LOW (ref 6.5–8.1)

## 2022-03-15 LAB — CBC WITH DIFFERENTIAL/PLATELET
Abs Immature Granulocytes: 0.26 10*3/uL — ABNORMAL HIGH (ref 0.00–0.07)
Basophils Absolute: 0.1 10*3/uL (ref 0.0–0.1)
Basophils Relative: 1 %
Eosinophils Absolute: 0 10*3/uL (ref 0.0–0.5)
Eosinophils Relative: 0 %
HCT: 29 % — ABNORMAL LOW (ref 36.0–46.0)
Hemoglobin: 9.3 g/dL — ABNORMAL LOW (ref 12.0–15.0)
Immature Granulocytes: 3 %
Lymphocytes Relative: 22 %
Lymphs Abs: 1.7 10*3/uL (ref 0.7–4.0)
MCH: 33.5 pg (ref 26.0–34.0)
MCHC: 32.1 g/dL (ref 30.0–36.0)
MCV: 104.3 fL — ABNORMAL HIGH (ref 80.0–100.0)
Monocytes Absolute: 0.6 10*3/uL (ref 0.1–1.0)
Monocytes Relative: 8 %
Neutro Abs: 5.1 10*3/uL (ref 1.7–7.7)
Neutrophils Relative %: 66 %
Platelets: 106 10*3/uL — ABNORMAL LOW (ref 150–400)
RBC: 2.78 MIL/uL — ABNORMAL LOW (ref 3.87–5.11)
RDW: 18 % — ABNORMAL HIGH (ref 11.5–15.5)
WBC: 7.8 10*3/uL (ref 4.0–10.5)
nRBC: 0 % (ref 0.0–0.2)

## 2022-03-15 LAB — MAGNESIUM: Magnesium: 1.3 mg/dL — ABNORMAL LOW (ref 1.7–2.4)

## 2022-03-15 MED ORDER — HEPARIN SOD (PORK) LOCK FLUSH 100 UNIT/ML IV SOLN
500.0000 [IU] | Freq: Once | INTRAVENOUS | Status: AC
  Administered 2022-03-15: 500 [IU] via INTRAVENOUS
  Filled 2022-03-15: qty 5

## 2022-03-15 MED ORDER — MAGNESIUM SULFATE 4 GM/100ML IV SOLN
4.0000 g | Freq: Once | INTRAVENOUS | Status: AC
  Administered 2022-03-15: 4 g via INTRAVENOUS
  Filled 2022-03-15: qty 100

## 2022-03-15 MED ORDER — POTASSIUM CHLORIDE 20 MEQ/100ML IV SOLN
20.0000 meq | Freq: Once | INTRAVENOUS | Status: AC
  Administered 2022-03-15: 20 meq via INTRAVENOUS

## 2022-03-15 MED ORDER — IOHEXOL 300 MG/ML  SOLN
100.0000 mL | Freq: Once | INTRAMUSCULAR | Status: AC | PRN
  Administered 2022-03-15: 100 mL via INTRAVENOUS

## 2022-03-15 MED ORDER — SODIUM CHLORIDE 0.9% FLUSH
10.0000 mL | Freq: Once | INTRAVENOUS | Status: AC
  Administered 2022-03-15: 10 mL via INTRAVENOUS
  Filled 2022-03-15: qty 10

## 2022-03-15 MED ORDER — SODIUM CHLORIDE 0.9 % IV SOLN
Freq: Once | INTRAVENOUS | Status: AC
  Filled 2022-03-15: qty 250

## 2022-03-15 MED ORDER — DOXYCYCLINE HYCLATE 100 MG PO TABS: 100.0000 mg | ORAL_TABLET | Freq: Two times a day (BID) | ORAL | 0 refills | Status: AC

## 2022-03-15 NOTE — Patient Instructions (Signed)
Hypokalemia Hypokalemia means that the amount of potassium in the blood is lower than normal. Potassium is a mineral (electrolyte) that helps regulate the amount of fluid in the body. It also stimulates muscle tightening (contraction) and helps nerves work properly. Normally, most of the body's potassium is inside cells, and only a very small amount is in the blood. Because the amount in the blood is so small, minor changes to potassium levels in the blood can be life-threatening. What are the causes? This condition may be caused by: Antibiotic medicine. Diarrhea or vomiting. Taking too much of a medicine that helps you have a bowel movement (laxative) can cause diarrhea and lead to hypokalemia. Chronic kidney disease (CKD). Medicines that help the body get rid of excess fluid (diuretics). Eating disorders, such as anorexia or bulimia. Low magnesium levels in the body. Sweating a lot. What are the signs or symptoms? Symptoms of this condition include: Weakness. Constipation. Fatigue. Muscle cramps. Mental confusion. Skipped heartbeats or irregular heartbeat (palpitations). Tingling or numbness. How is this diagnosed? This condition is diagnosed with a blood test. How is this treated? This condition may be treated by: Taking potassium supplements. Adjusting the medicines that you take. Eating more foods that contain a lot of potassium. If your potassium level is very low, you may need to get potassium through an IV and be monitored in the hospital. Follow these instructions at home: Eating and drinking  Eat a healthy diet. A healthy diet includes fresh fruits and vegetables, whole grains, healthy fats, and lean proteins. If told, eat more foods that contain a lot of potassium. These include: Nuts, such as peanuts and pistachios. Seeds, such as sunflower seeds and pumpkin seeds. Peas, lentils, and lima beans. Whole grain and bran cereals and breads. Fresh fruits and vegetables,  such as apricots, avocado, bananas, cantaloupe, kiwi, oranges, tomatoes, asparagus, and potatoes. Juices, such as orange, tomato, and prune. Lean meats, including fish. Milk and milk products, such as yogurt. General instructions Take over-the-counter and prescription medicines only as told by your health care provider. This includes vitamins, natural food products, and supplements. Keep all follow-up visits. This is important. Contact a health care provider if: You have weakness that gets worse. You feel your heart pounding or racing. You vomit. You have diarrhea. You have diabetes and you have trouble keeping your blood sugar in your target range. Get help right away if: You have chest pain. You have shortness of breath. You have vomiting or diarrhea that lasts for more than 2 days. You faint. These symptoms may be an emergency. Get help right away. Call 911. Do not wait to see if the symptoms will go away. Do not drive yourself to the hospital. Summary Hypokalemia means that the amount of potassium in the blood is lower than normal. This condition is diagnosed with a blood test. Hypokalemia may be treated by taking potassium supplements, adjusting the medicines that you take, or eating more foods that are high in potassium. If your potassium level is very low, you may need to get potassium through an IV and be monitored in the hospital. This information is not intended to replace advice given to you by your health care provider. Make sure you discuss any questions you have with your health care provider. Document Revised: 02/05/2021 Document Reviewed: 02/05/2021 Elsevier Patient Education  [place]. Hypomagnesemia Hypomagnesemia is a condition in which the level of magnesium in the blood is too low. Magnesium is a mineral that is found in  many foods. It is used in many different processes in the body. Hypomagnesemia can affect every organ in the body. In severe cases, it  can cause life-threatening problems. What are the causes? This condition may be caused by: Not getting enough magnesium in your diet or not having enough healthy foods to eat (malnutrition). Problems with magnesium absorption in the intestines. Dehydration. Excessive use of alcohol. Vomiting. Severe or long-term (chronic) diarrhea. Some medicines, including medicines that make you urinate more often (diuretics). Certain diseases, such as kidney disease, diabetes, celiac disease, and overactive thyroid. What are the signs or symptoms? Symptoms of this condition include: Loss of appetite, nausea, and vomiting. Involuntary shaking or trembling of a body part (tremor). Muscle weakness or tingling in the arms and legs. Sudden tightening of muscles (muscle spasms). Confusion. Psychiatric issues, such as: Depression and irritability. Psychosis. A feeling of fluttering of the heart (palpitations). Seizures. These symptoms are more severe if magnesium levels drop suddenly. How is this diagnosed? This condition may be diagnosed based on: Your symptoms and medical history. A physical exam. Blood and urine tests. How is this treated? Treatment depends on the cause and the severity of the condition. It may be treated by: Taking a magnesium supplement. This can be taken in pill form. If the condition is severe, magnesium is usually given through an IV. Making changes to your diet. You may be directed to eat foods that have a lot of magnesium, such as green leafy vegetables, peas, beans, and nuts. Not drinking alcohol. If you are struggling not to drink, ask your health care provider for help. Follow these instructions at home: Eating and drinking     Make sure that your diet includes foods with magnesium. Foods that have a lot of magnesium in them include: Green leafy vegetables, such as spinach and broccoli. Beans and peas. Nuts and seeds, such as almonds and sunflower seeds. Whole  grains, such as whole grain bread and fortified cereals. Drink fluids that contain salts and minerals (electrolytes), such as sports drinks, when you are active. Do not drink alcohol. General instructions Take over-the-counter and prescription medicines only as told by your health care provider. Take magnesium supplements as directed if your health care provider tells you to take them. Have your magnesium levels monitored as told by your health care provider. Keep all follow-up visits. This is important. Contact a health care provider if: You get worse instead of better. Your symptoms return. Get help right away if: You develop severe muscle weakness. You have trouble breathing. You feel that your heart is racing. These symptoms may represent a serious problem that is an emergency. Do not wait to see if the symptoms will go away. Get medical help right away. Call your local emergency services (911 in the [place] Do not drive yourself to the hospital. Summary Hypomagnesemia is a condition in which the level of magnesium in the blood is too low. Hypomagnesemia can affect every organ in the body. Treatment may include eating more foods that contain magnesium, taking magnesium supplements, and not drinking alcohol. Have your magnesium levels monitored as told by your health care provider. This information is not intended to replace advice given to you by your health care provider. Make sure you discuss any questions you have with your health care provider. Document Revised: 10/21/2020 Document Reviewed: 10/21/2020 Elsevier Patient Education  [place].

## 2022-03-15 NOTE — Progress Notes (Signed)
Eating and drinking well. Fever blisters are drying on her outside lips. No blisters inside her mouth. Denies any nausea or diarrhea. Fingernails are blackish, lifting up. 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 Waiting to see if she will need electrolyte replacement today.

## 2022-03-15 NOTE — Progress Notes (Signed)
Symptom Management [facility] at [facility] [phone] [phone]  Patient Care Team: Latanya Maudlin, NP as PCP - General (Family Medicine) Daiva Huge, RN as Oncology Nurse Navigator Cammie Sickle, [name]   Name of the patient: Marilyn Page  [id number]  12-10-1975   Date of visit: 03/15/22  Reason for Consult: Marilyn Page is a 46 y.o. female who presents today for:  Dehydration: Patient is here today to follow up on dehydration, follow up on stomaitis, and recheck from hospitalization that she had from (03/05/2022-03/07/2022) for neutropenic fever with nausea. She had her hospital follow up on 03/08/2022 at which time she was given IVF for dehydration as well as K for hypokalemia. She subsequently had additional [facility] follow up on 03/12/2022 at which time she had continued to struggle with hypokalemia. She was given 40 MEQ K, 4 mg Mag, and IVF. In addition her home potassium was increased from daily to BID.   Today she states that she continues to feel better each day. Food is starting to regain it's taste. Stools are normal. No nausea or vomiting. She is tolerating food and fluids by mouth well. She has increased her potassium supplement from once daily to BID. Mouth sores continue to heal well. She states that her only concern is that it appears that she has paronychia of one of her fingers that we were watching closely given recent paronychia of a different finger. She has noticed mild clear drainage but not erythema, edema or fevers. She has kept the areas bandaged to help with this.   Wt Readings from Last 3 Encounters:  03/15/22 218 lb 3.2 oz (99 kg)  03/12/22 210 lb (95.3 kg)  03/08/22 210 lb (95.3 kg)     Denies any neurologic complaints. Denies recent fevers or illnesses. Denies any easy bleeding or bruising. Reports good appetite and denies weight loss. Denies chest pain. Denies  any nausea, vomiting, constipation. Denies urinary complaints. Patient offers no further specific complaints today.    PAST MEDICAL HISTORY: Past Medical History:  Diagnosis Date   Carcinoma of upper-outer quadrant of right breast in female, estrogen receptor positive ([place]) 10/26/2021   Diabetes mellitus without complication ([place])    History of gestational diabetes    Personal history of chemotherapy     PAST SURGICAL HISTORY:  Past Surgical History:  Procedure Laterality Date   BREAST BIOPSY Right 10/19/2021   [place] Core bx 10:00 6 cmfn Ribbon Clip-INVASIVE MAMMARY CARCINOMA,. Axilla Butterfly hydro clip-MACRO METASTATIC MAMMARY CARCINOMA   CESAREAN SECTION  2005/2008   DILATION AND CURETTAGE OF UTERUS  2003   PORTACATH PLACEMENT Left 11/09/2021   Procedure: INSERTION PORT-A-CATH;  Surgeon: Robert Bellow, MD;  Location: [facility] ORS;  Service: General;  Laterality: Left;   SKIN BIOPSY Right 11/09/2021   Procedure: PUNCH BIOPSY SKIN, TATTOO LYMPH NODE RIGHT AXILLA;  Surgeon: Robert Bellow, MD;  Location: [facility] ORS;  Service: General;  Laterality: Right;    HEMATOLOGY/ONCOLOGY HISTORY:  Oncology History Overview Note  MAY 2023-    Targeted ultrasound is performed, showing a 2.9 x 1.9 x 2.0 cm irregular hypoechoic mass right breast 10 o'clock position 6 cm from nipple at the site of palpable concern.   There is an abnormal 8 mm thickened right axillary lymph node.   There is skin thickening overlying the right breast. ------------------  A. BREAST, RIGHT AT 10:00, 6 CM FROM THE NIPPLE; ULTRASOUND-GUIDED CORE  NEEDLE  BIOPSY:  - INVASIVE MAMMARY CARCINOMA, NO SPECIAL TYPE.   Size of invasive carcinoma: 11 mm in this sample  Histologic grade of invasive carcinoma: Grade 2                       Glandular/tubular differentiation score: 3                       Nuclear pleomorphism score: 2                       Mitotic rate score: 1                       Total score: 6   Ductal carcinoma in situ: Present, intermediate grade  Lymphovascular invasion: Not identified   B. LYMPH NODE, RIGHT AXILLARY; ULTRASOUND-GUIDED CORE NEEDLE BIOPSY:  - 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 IN GREATEST  EXTENT.   Comment:  The malignancy in the primary breast lesion appears morphologically  different from tumor in the lymph node sampling, with tumor present in  lymph node more suggestive of a histologic grade 3, with significantly  increased mitotic activity and pleomorphism. Due to the discrepancy in  these morphologic patterns, ER/PR/HER2 immunohistochemistry will be  performed on both blocks A1 and B1, with reflex to [place] for HER2 2+. The  results will be reported in an addendum.   ADDENDUM:  CASE SUMMARY: BREAST BIOMARKER TESTS - A - BREAST, RIGHT AT 10:00  Estrogen Receptor (ER) Status: POSITIVE          Percentage of cells with nuclear positivity: 71-80%          Average intensity of staining: Strong   Progesterone Receptor (PgR) Status: POSITIVE          Percentage of cells with nuclear positivity: 81-90%          Average intensity of staining: Strong   HER2 (by immunohistochemistry): POSITIVE (Score 3+)       Percentage of cells with uniform intense complete membrane  staining: 61-70%  HER2 displays a heterogeneous staining pattern, with areas showing a  strong 3+ staining pattern, and other regions with complete absence (0+)  of staining.   Ki-67: Not performed   CASE SUMMARY: BREAST BIOMARKER TESTS - B - RIGHT AXILLARY LYMPH NODE  Estrogen Receptor (ER) Status: POSITIVE          Percentage of cells with nuclear positivity: 81-90%          Average intensity of staining: Strong   Progesterone Receptor (PgR) Status: POSITIVE          Percentage of cells with nuclear positivity: 51-60%          Average intensity of staining: Strong   HER2 (by immunohistochemistry): NEGATIVE (Score 1+)  Ki-67: Not performed   #  MAY 2023- STAGE  III-T4N1 ER/PR positive HER2 POSITIVE right breast cancer;LN-positive-ER/PR positive however 2 NEGATIVE s/p biopsy [see discussion below].  Discussed with Dr. Sedalia Muta tumor heterogeneity. BREAST MRI- JUNE 2023- The patient's known malignancy in the upper outer quadrant of the right breast measures 3.8 x 2.8 x 3.6 cm. Numerous other suspicious masses are identified in the right breast as above involving the upper outer and upper inner quadrants, located both anterior and posterior to the known malignancy.  Numerous enhancing foci in the skin as above are worrisome for skin metastases ; Bx proven. Biopsy proven  metastatic node in the right axilla.  No evidence of malignancy in the left breast;  Two mildly prominent right internal mammary nodes were not FDG avid on recent PET-CT imaging.  Currently on neoadjuvant chemotherapy- TCH plus P x6 cycles;  MUGA scan May 31st, 2023- -61%.    # June 7th, 2023- NEO-ADJUVANT CHEMO- TCH+P   # Genetic counseling s/p- NEG for any deleterious mutations.     Carcinoma of upper-outer quadrant of right breast in female, estrogen receptor positive ([place])  10/26/2021 Initial Diagnosis   Carcinoma of upper-outer quadrant of right breast in female, estrogen receptor positive ([place])   10/26/2021 Cancer Staging   Staging form: Breast, AJCC 8th Edition - Clinical: Stage IB (cT2, cN1, cM0, G2, ER+, PR+, HER2+) - Signed by Cammie Sickle, MD on 10/26/2021 Histologic grading system: 3 grade system   11/10/2021 -  Chemotherapy   Patient is on Treatment Plan : BREAST  Docetaxel + Carboplatin + Trastuzumab + Pertuzumab  (TCHP) q21d      11/11/2021 - 01/13/2022 Chemotherapy   Patient is on Treatment Plan : BREAST  Docetaxel + Carboplatin + Trastuzumab + Pertuzumab  (TCHP) q21d       Genetic Testing   Negative genetic testing. No pathogenic variants identified on the [facility] CancerNext-Expanded+RNA panel. The report date is 11/29/2021.  The CancerNext-Expanded + RNAinsight gene  panel offered by [employer] and includes sequencing and rearrangement analysis for the following 77 genes: IP, ALK, APC*, ATM*, AXIN2, BAP1, BARD1, BLM, BMPR1A, BRCA1*, BRCA2*, BRIP1*, CDC73, CDH1*,CDK4, CDKN1B, CDKN2A, CHEK2*, CTNNA1, DICER1, FANCC, FH, FLCN, GALNT12, KIF1B, LZTR1, MAX, MEN1, MET, MLH1*, MSH2*, MSH3, MSH6*, MUTYH*, NBN, NF1*, NF2, NTHL1, PALB2*, PHOX2B, PMS2*, POT1, PRKAR1A, PTCH1, PTEN*, RAD51C*, RAD51D*,RB1, RECQL, RET, SDHA, SDHAF2, SDHB, SDHC, SDHD, SMAD4, SMARCA4, SMARCB1, SMARCE1, STK11, SUFU, TMEM127, TP53*,TSC1, TSC2, VHL and XRCC2 (sequencing and deletion/duplication); EGFR, EGLN1, HOXB13, KIT, MITF, PDGFRA, POLD1 and POLE (sequencing only); EPCAM and GREM1 (deletion/duplication only).     ALLERGIES:  is allergic to metformin.  MEDICATIONS:  Current Outpatient Medications  Medication Sig Dispense Refill   acetaminophen (TYLENOL) 325 MG tablet Take 2 tablets (650 mg total) by mouth every 6 (six) hours as needed for mild pain (or Fever >/= 101).     ascorbic acid (VITAMIN C) 500 MG tablet Take 1 tablet (500 mg total) by mouth daily. 30 tablet    calcium-vitamin D (OSCAL WITH D) 500-5 MG-MCG tablet Take 1 tablet by mouth 2 (two) times daily. 60 tablet 2   Cyanocobalamin (VITAMIN B-12 PO) Take 1 tablet by mouth daily.     doxycycline (VIBRA-TABS) 100 MG tablet Take 1 tablet (100 mg total) by mouth 2 (two) times daily for 10 days. 20 tablet 0   ferrous sulfate 325 (65 FE) MG tablet Take 325 mg by mouth 2 (two) times daily with a meal.     lidocaine-prilocaine (EMLA) cream Apply on the port. 30 -45 min  prior to port access. 30 g 3   Multiple Vitamin (MULTIVITAMIN WITH MINERALS) TABS tablet Take 1 tablet by mouth daily.     norethindrone (MICRONOR) 0.35 MG tablet Take 1 tablet (0.35 mg total) by mouth daily. 84 tablet 2   ONETOUCH VERIO test strip SMARTSIG:1 Each Via Meter Daily PRN     potassium chloride (KLOR-CON) 20 MEQ packet Take 20 mEq by mouth 2 (two) times daily.  60 packet 3   alum & mag hydroxide-simeth (MAALOX/MYLANTA) 200-200-20 MG/5ML suspension Take 30 mLs by mouth every 4 (four) hours as  needed for indigestion. (Patient not taking: Reported on 03/12/2022) 355 mL 0   loperamide (IMODIUM) 2 MG capsule Take 1 capsule (2 mg total) by mouth as needed for diarrhea or loose stools. (Patient not taking: Reported on 03/15/2022) 30 capsule 0   magic mouthwash w/lidocaine SOLN Take 5 mLs by mouth 4 (four) times daily as needed for mouth pain. Sig: Swish/Swallow 5-10 ml four times a day as needed. Dispense 480 ml. 1RF (Patient not taking: Reported on 03/15/2022) 480 mL 1   ondansetron (ZOFRAN-ODT) 8 MG disintegrating tablet Take 8 mg by mouth every 8 (eight) hours as needed for nausea or vomiting. (Patient not taking: Reported on 03/15/2022)     prochlorperazine (COMPAZINE) 10 MG tablet Take 1 tablet (10 mg total) by mouth every 6 (six) hours as needed for nausea or vomiting. (Patient not taking: Reported on 03/12/2022) 40 tablet 1   Semaglutide, 1 MG/DOSE, 4 MG/3ML SOPN Inject into the skin. Inject 1 mg (0.75 ml) subcutaneously once a week. (Patient not taking: Reported on 03/15/2022)     No current facility-administered medications for this visit.   Facility-Administered Medications Ordered in Other Visits  Medication Dose Route Frequency Provider Last Rate Last Admin   heparin lock flush 100 unit/mL  500 Units Intravenous Once Azara Gemme M, PA-C       magnesium sulfate IVPB 4 g 100 mL  4 g Intravenous Once Hughie Closs, PA-C 50 mL/hr at 03/15/22 1044 4 g at 03/15/22 1044    VITAL SIGNS: BP 111/75 (BP Location: Left Arm)   Pulse 85   Temp 98.2 F (36.8 C) (Tympanic)   Resp 18   Wt 218 lb 3.2 oz (99 kg)   BMI 35.22 kg/m  Filed Weights   03/15/22 1012  Weight: 218 lb 3.2 oz (99 kg)     Estimated body mass index is 35.22 kg/m as calculated from the following:   Height as of 03/08/22: 5' 6"  (1.676 m).   Weight as of this encounter: 218 lb 3.2 oz  (99 kg).  LABS: CBC:    Component Value Date/Time   WBC 7.8 03/15/2022 0957   HGB 9.3 (L) 03/15/2022 0957   HGB 7.8 (L) 04/23/2021 1431   HCT 29.0 (L) 03/15/2022 0957   HCT 25.5 (L) 04/23/2021 1431   PLT 106 (L) 03/15/2022 0957   PLT 384 04/23/2021 1431   MCV 104.3 (H) 03/15/2022 0957   MCV 84 04/23/2021 1431   NEUTROABS 5.1 03/15/2022 0957   NEUTROABS 5.4 04/23/2021 1431   LYMPHSABS 1.7 03/15/2022 0957   LYMPHSABS 2.2 04/23/2021 1431   MONOABS 0.6 03/15/2022 0957   EOSABS 0.0 03/15/2022 0957   EOSABS 0.2 04/23/2021 1431   BASOSABS 0.1 03/15/2022 0957   BASOSABS 0.1 04/23/2021 1431   Comprehensive Metabolic Panel:    Component Value Date/Time   NA 138 03/15/2022 0957   NA 143 09/26/2017 1512   K 3.4 (L) 03/15/2022 0957   CL 104 03/15/2022 0957   CO2 25 03/15/2022 0957   BUN 7 03/15/2022 0957   BUN 9 09/26/2017 1512   CREATININE 0.64 03/15/2022 0957   GLUCOSE 129 (H) 03/15/2022 0957   CALCIUM 8.7 (L) 03/15/2022 0957   AST 33 03/15/2022 0957   ALT 20 03/15/2022 0957   ALKPHOS 51 03/15/2022 0957   BILITOT 0.4 03/15/2022 0957   BILITOT <0.2 09/26/2017 1512   PROT 5.8 (L) 03/15/2022 0957   PROT 6.8 09/26/2017 1512   ALBUMIN 2.9 (L) 03/15/2022 0957  ALBUMIN 3.9 09/26/2017 1512    RADIOGRAPHIC STUDIES: DG Chest 2 View  Result Date: 03/05/2022 CLINICAL DATA:  Fever, breast cancer EXAM: CHEST - 2 VIEW COMPARISON:  12/27/2021 FINDINGS: Frontal and lateral views of the chest demonstrates stable left chest wall port. Cardiac silhouette is unremarkable. No acute airspace disease, effusion, or pneumothorax. No acute bony abnormalities. IMPRESSION: 1. No acute intrathoracic process. Electronically Signed   By: Randa Ngo M.D.   On: 03/05/2022 21:30    PERFORMANCE STATUS (ECOG) : 1 - Symptomatic but completely ambulatory  Review of Systems Unless otherwise noted, a complete review of systems is negative.  Physical Exam General: NAD HEENT: Sores of lips have almost  fully resolved Cardiovascular: regular rate and rhythm Pulmonary: clear ant fields Abdomen: soft, nontender, + bowel sounds GU: no suprapubic tenderness Extremities: no edema, no joint deformities Skin: no rashes. There is mild erythema of the fingernail of right 3rd digit. No trailing erythema or stiffness of joint of finger.  Neurological: Weakness but otherwise nonfocal  Assessment and Plan- Patient is a 46 y.o. female    Encounter Diagnoses  Name Primary?   Carcinoma of upper-outer quadrant of right breast in female, estrogen receptor positive ([place]) Yes   Hypokalemia    Hypomagnesemia    Paronychia of finger, right    Neutropenic fever ([facility])    Dehydration    Stomatitis    Iron deficiency anemia due to chronic blood loss    Breast Cancer: Chronic. Has completed her neoadjuvant chemotherapy TCH+P x 3. Had appointment with Dr. Terri Piedra with plan of meeting with plastics to determine if she is going to have 1 surgery or 2. Plan is for a Right mastectomy. Today she reports that she had her follow up imaging scans completed this morning. Waiting on plastic's referral call (referral placed on Thursday).   Hypokalemia: Improved but not back to normal. Supplementation with 20 MEQ here today along with 4 mg of mag. She will hold her at home K supplement today but will return to BID tomorrow. We will recheck her K level at follow up on Thursday. Reviewed red flags.   Hypomagnesemia: Improved but still low. IV supplementation today. RTC Thursday for recheck.   Paronychia: Treated on 02/18/2022 with doxycycline. Restarting on doxycyline as she did well with this previously for other finger that was affected. Sent to pharmacy.   Neutropenic fever: Resolved. Will continue to monitor CBC.   Dehydration: Improved. 250 ml supplementation today with electrolytes as stated above   Stomatitis: Resolving. Appears secondary to chemotherapy.Starting to heal. Not painful. Continue using Aquaphor PRN. NO  change of plan regarding this today   Loose Stool: Resolved. Likely secondary to chemotherapy.   Anemia: Improving. B12 level was high (not on supplementation) at her 03/12/2022 visit. Consider recheck at follow up on Thursday.   Disposition: 20 MEQ K, 4 g Mag, 250 ml IVF Doxycyline sent to pharmacy RTC Thursday as previously planned   Patient expressed understanding and was in agreement with this plan. She also understands that She can call clinic at any time with any questions, concerns, or complaints.   Thank you for allowing me to participate in the care of this very pleasant patient.   Time Total: 25  Visit consisted of counseling and education dealing with the complex and emotionally intense issues of symptom management in the setting of serious illness.Greater than 50%  of this time was spent counseling and coordinating care related to the above assessment and plan.  Signed  by: Nelwyn Salisbury, PA-C

## 2022-03-15 NOTE — Telephone Encounter (Signed)
Pt dropped off fmla forms on Wed. 03/10/22. Updated patient today that forms are still in process.  Fmla forms completed-03/15/22 pending Dr. Sharmaine Base signature. Please have Dr. B sign- and call pt for form pick up.

## 2022-03-16 ENCOUNTER — Encounter: Payer: Self-pay | Admitting: Internal Medicine

## 2022-03-17 ENCOUNTER — Other Ambulatory Visit: Payer: Self-pay

## 2022-03-18 ENCOUNTER — Inpatient Hospital Stay: Payer: 59

## 2022-03-18 ENCOUNTER — Inpatient Hospital Stay (HOSPITAL_BASED_OUTPATIENT_CLINIC_OR_DEPARTMENT_OTHER): Payer: 59 | Admitting: Internal Medicine

## 2022-03-18 ENCOUNTER — Encounter: Payer: Self-pay | Admitting: Internal Medicine

## 2022-03-18 DIAGNOSIS — C50411 Malignant neoplasm of upper-outer quadrant of right female breast: Secondary | ICD-10-CM

## 2022-03-18 DIAGNOSIS — E876 Hypokalemia: Secondary | ICD-10-CM

## 2022-03-18 DIAGNOSIS — Z170 Estrogen receptor positive status [ER+]: Secondary | ICD-10-CM

## 2022-03-18 LAB — COMPREHENSIVE METABOLIC PANEL
ALT: 17 U/L (ref 0–44)
AST: 28 U/L (ref 15–41)
Albumin: 2.8 g/dL — ABNORMAL LOW (ref 3.5–5.0)
Alkaline Phosphatase: 42 U/L (ref 38–126)
Anion gap: 6 (ref 5–15)
BUN: 7 mg/dL (ref 6–20)
CO2: 25 mmol/L (ref 22–32)
Calcium: 8.4 mg/dL — ABNORMAL LOW (ref 8.9–10.3)
Chloride: 109 mmol/L (ref 98–111)
Creatinine, Ser: 0.74 mg/dL (ref 0.44–1.00)
GFR, Estimated: >60 mL/min (ref >60)
Glucose, Bld: 167 mg/dL — ABNORMAL HIGH (ref 70–99)
Potassium: 3.5 mmol/L (ref 3.5–5.1)
Sodium: 140 mmol/L (ref 135–145)
Total Bilirubin: 0.2 mg/dL — ABNORMAL LOW (ref 0.3–1.2)
Total Protein: 5.3 g/dL — ABNORMAL LOW (ref 6.5–8.1)

## 2022-03-18 LAB — CBC WITH DIFFERENTIAL/PLATELET
Abs Immature Granulocytes: 0.25 10*3/uL — ABNORMAL HIGH (ref 0.00–0.07)
Basophils Absolute: 0.1 10*3/uL (ref 0.0–0.1)
Basophils Relative: 1 %
Eosinophils Absolute: 0 10*3/uL (ref 0.0–0.5)
Eosinophils Relative: 0 %
HCT: 28 % — ABNORMAL LOW (ref 36.0–46.0)
Hemoglobin: 9 g/dL — ABNORMAL LOW (ref 12.0–15.0)
Immature Granulocytes: 4 %
Lymphocytes Relative: 24 %
Lymphs Abs: 1.5 10*3/uL (ref 0.7–4.0)
MCH: 34 pg (ref 26.0–34.0)
MCHC: 32.1 g/dL (ref 30.0–36.0)
MCV: 105.7 fL — ABNORMAL HIGH (ref 80.0–100.0)
Monocytes Absolute: 0.6 10*3/uL (ref 0.1–1.0)
Monocytes Relative: 10 %
Neutro Abs: 4 10*3/uL (ref 1.7–7.7)
Neutrophils Relative %: 61 %
Platelets: 143 10*3/uL — ABNORMAL LOW (ref 150–400)
RBC: 2.65 MIL/uL — ABNORMAL LOW (ref 3.87–5.11)
RDW: 18.1 % — ABNORMAL HIGH (ref 11.5–15.5)
WBC: 6.5 10*3/uL (ref 4.0–10.5)
nRBC: 0 % (ref 0.0–0.2)

## 2022-03-18 LAB — MAGNESIUM: Magnesium: 1.4 mg/dL — ABNORMAL LOW (ref 1.7–2.4)

## 2022-03-18 MED ORDER — SODIUM CHLORIDE 0.9% FLUSH
10.0000 mL | Freq: Once | INTRAVENOUS | Status: AC
  Administered 2022-03-18: 10 mL via INTRAVENOUS
  Filled 2022-03-18: qty 10

## 2022-03-18 MED ORDER — HEPARIN SOD (PORK) LOCK FLUSH 100 UNIT/ML IV SOLN
500.0000 [IU] | Freq: Once | INTRAVENOUS | Status: AC
  Administered 2022-03-18: 500 [IU] via INTRAVENOUS
  Filled 2022-03-18: qty 5

## 2022-03-18 MED ORDER — MAGNESIUM SULFATE 2 GM/50ML IV SOLN
2.0000 g | Freq: Once | INTRAVENOUS | Status: AC
  Administered 2022-03-18: 2 g via INTRAVENOUS

## 2022-03-18 MED ORDER — SODIUM CHLORIDE 0.9 % IV SOLN
Freq: Once | INTRAVENOUS | Status: DC
  Filled 2022-03-18: qty 250

## 2022-03-18 MED ORDER — SLOW MAGNESIUM/CALCIUM 70-117 MG PO TBEC: 1.0000 | DELAYED_RELEASE_TABLET | Freq: Two times a day (BID) | ORAL | 6 refills | Status: DC

## 2022-03-18 NOTE — [place] Notes (Signed)
[name] NOTE  Patient Care Team: Latanya Maudlin, NP as PCP - General (Family Medicine) Daiva Huge, RN as Oncology Nurse Navigator Cammie Sickle, [name]  CHIEF COMPLAINTS/PURPOSE OF CONSULTATION: Breast cancer  Oncology History Overview Note  MAY 2023-    Targeted ultrasound is performed, showing a 2.9 x 1.9 x 2.0 cm irregular hypoechoic mass right breast 10 o'clock position 6 cm from nipple at the site of palpable concern.   There is an abnormal 8 mm thickened right axillary lymph node.   There is skin thickening overlying the right breast. ------------------  A. BREAST, RIGHT AT 10:00, 6 CM FROM THE NIPPLE; ULTRASOUND-GUIDED CORE  NEEDLE BIOPSY:  - INVASIVE MAMMARY CARCINOMA, NO SPECIAL TYPE.   Size of invasive carcinoma: 11 mm in this sample  Histologic grade of invasive carcinoma: Grade 2                       Glandular/tubular differentiation score: 3                       Nuclear pleomorphism score: 2                       Mitotic rate score: 1                       Total score: 6  Ductal carcinoma in situ: Present, intermediate grade  Lymphovascular invasion: Not identified   B. LYMPH NODE, RIGHT AXILLARY; ULTRASOUND-GUIDED CORE NEEDLE BIOPSY:  - 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 IN GREATEST  EXTENT.   Comment:  The malignancy in the primary breast lesion appears morphologically  different from tumor in the lymph node sampling, with tumor present in  lymph node more suggestive of a histologic grade 3, with significantly  increased mitotic activity and pleomorphism. Due to the discrepancy in  these morphologic patterns, ER/PR/HER2 immunohistochemistry will be  performed on both blocks A1 and B1, with reflex to [place] for HER2 2+. The  results will be reported in an addendum.   ADDENDUM:  CASE SUMMARY: BREAST BIOMARKER TESTS - A - BREAST, RIGHT AT 10:00  Estrogen Receptor  (ER) Status: POSITIVE          Percentage of cells with nuclear positivity: 71-80%          Average intensity of staining: Strong   Progesterone Receptor (PgR) Status: POSITIVE          Percentage of cells with nuclear positivity: 81-90%          Average intensity of staining: Strong   HER2 (by immunohistochemistry): POSITIVE (Score 3+)       Percentage of cells with uniform intense complete membrane  staining: 61-70%  HER2 displays a heterogeneous staining pattern, with areas showing a  strong 3+ staining pattern, and other regions with complete absence (0+)  of staining.   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  Discussed with Dr. Sedalia Muta tumor heterogeneity. BREAST MRI- JUNE 2023- The patient's known malignancy in the upper outer quadrant of the right breast measures 3.8 x 2.8 x 3.6 cm. Numerous other suspicious masses are identified in the right breast as above involving the upper outer and upper inner quadrants, located both anterior and posterior to the known malignancy.  Numerous enhancing foci in the skin as above are worrisome for skin metastases ; Bx proven. Biopsy proven metastatic node in the right axilla.  No evidence of malignancy in the left breast;  Two mildly prominent right internal mammary nodes were not FDG avid on recent PET-CT imaging.  Currently on neoadjuvant chemotherapy- TCH  plus P x6 cycles;  MUGA scan May 31st, 2023- -61%.    # June 7th, 2023- NEO-ADJUVANT CHEMO- TCH+P   # Genetic counseling s/p- NEG for any deleterious mutations.     Carcinoma of upper-outer quadrant of right breast in female, estrogen receptor positive ([place])  10/26/2021 Initial Diagnosis   Carcinoma of upper-outer quadrant of right breast in female, estrogen receptor positive ([place])   10/26/2021 Cancer Staging   Staging form: Breast, AJCC 8th Edition - Clinical: Stage IB (cT2, cN1, cM0, G2, ER+, PR+, HER2+) - Signed by Cammie Sickle, MD on 10/26/2021 Histologic grading system: 3 grade system   11/10/2021 -  Chemotherapy   Patient is on Treatment Plan : BREAST  Docetaxel + Carboplatin + Trastuzumab + Pertuzumab  (TCHP) q21d      11/11/2021 - 01/13/2022 Chemotherapy   Patient is on Treatment Plan : BREAST  Docetaxel + Carboplatin + Trastuzumab + Pertuzumab  (TCHP) q21d       Genetic Testing   Negative genetic testing. No pathogenic variants identified on the [facility] CancerNext-Expanded+RNA panel. The report date is 11/29/2021.  The CancerNext-Expanded + RNAinsight gene panel offered by [employer] and includes sequencing and rearrangement analysis for the following 77 genes: IP, ALK, APC*, ATM*, AXIN2, BAP1, BARD1, BLM, BMPR1A, BRCA1*, BRCA2*, BRIP1*, CDC73, CDH1*,CDK4, CDKN1B, CDKN2A, CHEK2*, CTNNA1, DICER1, FANCC, FH, FLCN, GALNT12, KIF1B, LZTR1, MAX, MEN1, MET, MLH1*, MSH2*, MSH3, MSH6*, MUTYH*, NBN, NF1*, NF2, NTHL1, PALB2*, PHOX2B, PMS2*, POT1, PRKAR1A, PTCH1, PTEN*, RAD51C*, RAD51D*,RB1, RECQL, RET, SDHA, SDHAF2, SDHB, SDHC, SDHD, SMAD4, SMARCA4, SMARCB1, SMARCE1, STK11, SUFU, TMEM127, TP53*,TSC1, TSC2, VHL and XRCC2 (sequencing and deletion/duplication); EGFR, EGLN1, HOXB13, KIT, MITF, PDGFRA, POLD1 and POLE (sequencing only); EPCAM and GREM1 (deletion/duplication only).     HISTORY OF PRESENTING ILLNESS: Patient is ambulating independently.  Accompanied by her husband.   Claudean Severance 46 y.o.  female right breast TRIPLE POSITIVE with T4- Stage III currently s/p neoadjuvant TCH plus P chemotherapy #6 is here for follow-up/is here to review the results of her CT scan.  In the interim patient has met with Dr. Bary Castilla for consideration of mastectomy.  She is currently awaiting evaluation with plastic surgery.  Status post cycle #6 patient was admitted to the hospital for neutropenic fever.  Discharged without any major complications..  Patient continues to complains of mild tingling and numbness in extremities.  Also, leg swelling and weight gain. Complains of fatigue. No skin rash.  Complains of intermittent cramping in the legs.  Review of Systems  Constitutional:  Positive for malaise/fatigue. Negative for chills, diaphoresis, fever  and weight loss.  HENT:  Negative for nosebleeds and sore throat.   Eyes:  Negative for double vision.  Respiratory:  Negative for cough, hemoptysis, sputum production, shortness of breath and wheezing.   Cardiovascular:  Negative for chest pain, palpitations, orthopnea and leg swelling.  Gastrointestinal:  Negative for abdominal pain, blood in stool, constipation, diarrhea, heartburn, melena, nausea and vomiting.  Genitourinary:  Negative for dysuria, frequency and urgency.  Musculoskeletal:  Positive for myalgias. Negative for back pain and joint pain.  Skin: Negative.  Negative for itching and rash.  Neurological:  Negative for dizziness, tingling, focal weakness, weakness and headaches.  Endo/Heme/Allergies:  Does not bruise/bleed easily.  Psychiatric/Behavioral:  Negative for depression. The patient is not nervous/anxious and does not have insomnia.      MEDICAL HISTORY:  Past Medical History:  Diagnosis Date  . Carcinoma of upper-outer quadrant of right breast in female, estrogen receptor positive ([place]) 10/26/2021  . Diabetes mellitus without complication ([place])   . History of gestational diabetes   . Personal history of  chemotherapy     SURGICAL HISTORY: Past Surgical History:  Procedure Laterality Date  . BREAST BIOPSY Right 10/19/2021   [place] Core bx 10:00 6 cmfn Ribbon Clip-INVASIVE MAMMARY CARCINOMA,. Axilla Butterfly hydro clip-MACRO METASTATIC MAMMARY CARCINOMA  . CESAREAN SECTION  2005/2008  . DILATION AND CURETTAGE OF UTERUS  2003  . PORTACATH PLACEMENT Left 11/09/2021   Procedure: INSERTION PORT-A-CATH;  Surgeon: Robert Bellow, MD;  Location: [facility] ORS;  Service: General;  Laterality: Left;  . SKIN BIOPSY Right 11/09/2021   Procedure: PUNCH BIOPSY SKIN, TATTOO LYMPH NODE RIGHT AXILLA;  Surgeon: Robert Bellow, MD;  Location: [facility] ORS;  Service: General;  Laterality: Right;    SOCIAL HISTORY: Social History   Socioeconomic History  . Marital status: Married    Spouse name: Jaci Standard  . Number of children: 2  . Years of education: Not on file  . Highest education level: Not on file  Occupational History  . Occupation: and Optometrist  Tobacco Use  . Smoking status: Never  . Smokeless tobacco: Never  Vaping Use  . Vaping Use: Never used  Substance and Sexual Activity  . Alcohol use: Never  . Drug use: Never  . Sexual activity: Yes    Birth control/protection: Other-see comments, Surgical    Comment: vasectomy  Other Topics Concern  . 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    Social Determinants of Health   Financial Resource Strain: Not on file  Food Insecurity: Not on file  Transportation Needs: Not on file  Physical Activity: Inactive (09/26/2017)   Exercise Vital Sign   . Days of Exercise per Week: 0 days   . Minutes of Exercise per Session: 0 min  Stress: Not on file  Social Connections: Not on file  Intimate Partner Violence: Not on file    FAMILY HISTORY: Family History  Problem Relation Age of Onset  . Ovarian cysts Mother   . Migraines Mother   . Melanoma Mother        on leg  .  Hyperlipidemia Father   . Diabetes Maternal Aunt   . Stomach cancer Paternal Aunt   . Diabetes Maternal Grandfather   . Lymphoma Cousin 17  . Breast cancer Neg Hx     ALLERGIES:  is allergic to metformin.  MEDICATIONS:  Current Outpatient Medications  Medication Sig Dispense Refill  . acetaminophen (TYLENOL) 325  MG tablet Take 2 tablets (650 mg total) by mouth every 6 (six) hours as needed for mild pain (or Fever >/= 101).    Marland Kitchen ascorbic acid (VITAMIN C) 500 MG tablet Take 1 tablet (500 mg total) by mouth daily. 30 tablet   . calcium-vitamin D (OSCAL WITH D) 500-5 MG-MCG tablet Take 1 tablet by mouth 2 (two) times daily. 60 tablet 2  . Cyanocobalamin (VITAMIN B-12 PO) Take 1 tablet by mouth daily.    Marland Kitchen doxycycline (VIBRA-TABS) 100 MG tablet Take 1 tablet (100 mg total) by mouth 2 (two) times daily for 10 days. 20 tablet 0  . ferrous sulfate 325 (65 FE) MG tablet Take 325 mg by mouth 2 (two) times daily with a meal.    . lidocaine-prilocaine (EMLA) cream Apply on the port. 30 -45 min  prior to port access. 30 g 3  . Magnesium Cl-Calcium Carbonate (SLOW MAGNESIUM/CALCIUM) 70-117 MG TBEC Take 1 tablet by mouth 2 (two) times daily. 60 tablet 6  . Multiple Vitamin (MULTIVITAMIN WITH MINERALS) TABS tablet Take 1 tablet by mouth daily.    . norethindrone (MICRONOR) 0.35 MG tablet Take 1 tablet (0.35 mg total) by mouth daily. 84 tablet 2  . ondansetron (ZOFRAN-ODT) 8 MG disintegrating tablet Take 8 mg by mouth every 8 (eight) hours as needed for nausea or vomiting.    Glory Rosebush VERIO test strip SMARTSIG:1 Each Via Meter Daily PRN    . potassium chloride (KLOR-CON) 20 MEQ packet Take 20 mEq by mouth 2 (two) times daily. 60 packet 3  . alum & mag hydroxide-simeth (MAALOX/MYLANTA) 200-200-20 MG/5ML suspension Take 30 mLs by mouth every 4 (four) hours as needed for indigestion. (Patient not taking: Reported on 03/12/2022) 355 mL 0  . loperamide (IMODIUM) 2 MG capsule Take 1 capsule (2 mg total) by  mouth as needed for diarrhea or loose stools. (Patient not taking: Reported on 03/15/2022) 30 capsule 0  . magic mouthwash w/lidocaine SOLN Take 5 mLs by mouth 4 (four) times daily as needed for mouth pain. Sig: Swish/Swallow 5-10 ml four times a day as needed. Dispense 480 ml. 1RF (Patient not taking: Reported on 03/15/2022) 480 mL 1  . prochlorperazine (COMPAZINE) 10 MG tablet Take 1 tablet (10 mg total) by mouth every 6 (six) hours as needed for nausea or vomiting. (Patient not taking: Reported on 03/12/2022) 40 tablet 1  . Semaglutide, 1 MG/DOSE, 4 MG/3ML SOPN Inject into the skin. Inject 1 mg (0.75 ml) subcutaneously once a week. (Patient not taking: Reported on 03/15/2022)     No current facility-administered medications for this visit.   Facility-Administered Medications Ordered in Other Visits  Medication Dose Route Frequency Provider Last Rate Last Admin  . 0.9 %  sodium chloride infusion   Intravenous Once Charlaine Dalton R, MD          .  PHYSICAL EXAMINATION: ECOG PERFORMANCE STATUS: 0 - Asymptomatic  Vitals:   03/18/22 1003  BP: (!) 109/59  Pulse: 91  Resp: 18  Temp: 97.6 F (36.4 C)  SpO2: 99%     Filed Weights   03/18/22 1003  Weight: 221 lb 6.4 oz (100.4 kg)    Right breast-10:00 vague 3 to 4 cm mass noted-with skin thickening/ ?  Biopsy changes.  No nipple changes no erythema.   Physical Exam Vitals and nursing note reviewed.  HENT:     Head: Normocephalic and atraumatic.     Mouth/Throat:     Pharynx: Oropharynx is clear.  Eyes:  Extraocular Movements: Extraocular movements intact.     Pupils: Pupils are equal, round, and reactive to light.  Cardiovascular:     Rate and Rhythm: Normal rate and regular rhythm.  Pulmonary:     Comments: Decreased breath sounds bilaterally.  Abdominal:     Palpations: Abdomen is soft.  Musculoskeletal:        General: Normal range of motion.     Cervical back: Normal range of motion.  Skin:    General: Skin is  warm.  Neurological:     General: No focal deficit present.     Mental Status: She is alert and oriented to person, place, and time.  Psychiatric:        Behavior: Behavior normal.        Judgment: Judgment normal.     LABORATORY DATA:  I have reviewed the data as listed Lab Results  Component Value Date   WBC 6.5 03/18/2022   HGB 9.0 (L) 03/18/2022   HCT 28.0 (L) 03/18/2022   MCV 105.7 (H) 03/18/2022   PLT 143 (L) 03/18/2022   Recent Labs    12/27/21 2105 12/28/21 0329 03/12/22 0926 03/15/22 0957 03/18/22 0940  NA 134*   < > 139 138 140  K 3.6   < > 2.7* 3.4* 3.5  CL 104   < > 108 104 109  CO2 22   < > $R'26 25 25  'Nk$ GLUCOSE 192*   < > 158* 129* 167*  BUN 16   < > 5* 7 7  CREATININE 0.93   < > 0.66 0.64 0.74  CALCIUM 8.6*   < > 8.8* 8.7* 8.4*  GFRNONAA >60   < > >60 >60 >60  PROT 7.0   < > 5.7* 5.8* 5.3*  ALBUMIN 3.7   < > 2.9* 2.9* 2.8*  AST 42*   < > 26 33 28  ALT 51*   < > $R'22 20 17  'ct$ ALKPHOS 70   < > 53 51 42  BILITOT 1.1   < > 0.5 0.4 0.2*  BILIDIR 0.2  --   --   --   --   IBILI 0.9  --   --   --   --    < > = values in this interval not displayed.    RADIOGRAPHIC STUDIES: I have personally reviewed the radiological images as listed and agreed with the findings in the report. CT CHEST ABDOMEN PELVIS W CONTRAST  Result Date: 03/15/2022 CLINICAL DATA:  Carcinoma of upper outer quadrant of right breast in female, estrogen receptor positive. Breast cancer, invasive, initial workup. EXAM: CT CHEST, ABDOMEN, AND PELVIS WITH CONTRAST TECHNIQUE: Multidetector CT imaging of the chest, abdomen and pelvis was performed following the standard protocol during bolus administration of intravenous contrast. RADIATION DOSE REDUCTION: This exam was performed according to the departmental dose-optimization program which includes automated exposure control, adjustment of the mA and/or kV according to patient size and/or use of iterative reconstruction technique. CONTRAST:  160mL  OMNIPAQUE IOHEXOL 300 MG/ML  SOLN COMPARISON:  Chest CTA 12/28/2021, abdominal CT 12/27/2021. PET CT 11/10/2021 FINDINGS: CT CHEST FINDINGS Cardiovascular: Left chest port with tip in the atrial caval junction. The heart is normal in size. No pericardial effusion or thickening. Normal caliber thoracic aorta. Mediastinum/Nodes: Stable small mediastinal lymph nodes, none of which are enlarged by size criteria. There is no hilar adenopathy. The previous low right axillary node that was hypermetabolic has diminished in size, only punctate. No enlarged axillary lymph nodes.  No supraclavicular or internal mammary adenopathy. The esophagus is decompressed. Lungs/Pleura: No pulmonary nodule. No focal airspace disease. There is a trace left pleural effusion that was new from prior exam. No enhancement or nodularity. The trachea and central bronchi are patent. Musculoskeletal: No focal bone lesion or acute osseous findings. Known right breast mass is obscured by adjacent breast tissue. There is right breast skin thickening. CT ABDOMEN PELVIS FINDINGS Hepatobiliary: No cystic or solid liver lesion. Mild decreased hepatic density typical of steatosis. Gallbladder physiologically distended, no calcified stone. No biliary dilatation. Pancreas: No pancreatic mass.  No ductal dilatation or inflammation. Spleen: Upper normal in size but normal in volume. No focal splenic abnormality. Adrenals/Urinary Tract: No discrete adrenal nodule. Low-density thickening of the left adrenal gland is stable from prior exams and not hypermetabolic on prior PET. No hydronephrosis, renal calculi or focal renal abnormality. Decompressed urinary bladder, not well assessed due to nondistention but noninflamed. Stomach/Bowel: Physiologically distended stomach. No bowel obstruction or inflammatory change. The appendix is normal. Small-moderate colonic stool burden. Vascular/Lymphatic: Nonspecific 11 mm left inguinal lymph node, typically reactive. Small  mesenteric and retroperitoneal nodes are stable from prior exam and not enlarged by size criteria. Normal caliber abdominal aorta. No acute vascular findings. Reproductive: Enlarged uterus at is heterogeneous, the endometrial complex is poorly defined. No adnexal mass. Other: No ascites or free fluid. No omental thickening. Tiny fat containing umbilical hernia. Musculoskeletal: No focal bone lesions or acute osseous findings. Mild L4-L5 and L5-S1 degenerative disc disease. IMPRESSION: 1. Known right breast mass is obscured by adjacent breast tissue. Right breast skin thickening. 2. The previous hypermetabolic low right axillary node has diminished in size, now only punctate. 3. No evidence of metastatic disease in the chest, abdomen, or pelvis. 4. Enlarged heterogeneous uterus with poorly defined endometrial complex. Previous pelvic ultrasounds were suspicious for adenomyosis. 5. Hepatic steatosis. Electronically Signed   By: Keith Rake M.D.   On: 03/15/2022 11:46   DG Chest 2 View  Result Date: 03/05/2022 CLINICAL DATA:  Fever, breast cancer EXAM: CHEST - 2 VIEW COMPARISON:  12/27/2021 FINDINGS: Frontal and lateral views of the chest demonstrates stable left chest wall port. Cardiac silhouette is unremarkable. No acute airspace disease, effusion, or pneumothorax. No acute bony abnormalities. IMPRESSION: 1. No acute intrathoracic process. Electronically Signed   By: Randa Ngo M.D.   On: 03/05/2022 21:30    ASSESSMENT & PLAN:   Carcinoma of upper-outer quadrant of right breast in female, estrogen receptor positive ([place]) # MAY 2023- STAGE III-T4N1 ER/PR positive HER2 POSITIVE right breast cancer;LN-positive-ER/PR positive however 2 NEGATIVE s/p biopsy [see discussion below].  Discussed with Dr. Sedalia Muta tumor heterogeneity. BREAST MRI- JUNE 2023- The patient's known malignancy in the upper outer quadrant of the right breast measures 3.8 x 2.8 x 3.6 cm. Numerous other suspicious masses are  identified in the right breast as above involving the upper outer and upper inner quadrants, located both anterior and posterior to the known malignancy.  Numerous enhancing foci in the skin as above are worrisome for skin metastases; Two mildly prominent right internal mammary nodes were not FDG avid on recent PET-CT imaging.    # Currently s/p neoadjuvant chemotherapy- TCH plus P x6 cycles- CT CAP- OCT 9th, 2023-  Known right breast mass is obscured by adjacent breast tissue; Right breast skin thickening; The previous hypermetabolic low right axillary node has diminished in size, now only punctate; No evidence of metastatic disease in the chest, abdomen, or pelvis.   #Patient  s/p evaluation with surgery-awaiting bilateral mastectomy; awaiting plastic surgery evaluation.  Discussed the most likely need for radiation given her inflammatory cancer.  Discussed with Dr. Augustine Radar plastic surgery; and also regarding need for evaluation in the underarm.  Discussed that patient will need adjuvant chemotherapy after her surgery.  Decisions to be made after final pathology is available.  #Incidental findings on Imaging  CT scan OCT 2023: Enlarged heterogeneous uterus with poorly defined endometrial complex.Hepatic steatosis. I reviewed/discussed/counseled the patient.  This will need to be followed up later.   MUGA scan May 31st, 2023- -61%.   # Electrolyet abomilaities- Hypokalemia/hypomagnesemia-potassium 3.6 today-recommend 1K today; magnesium 1.4-2 g of IV mag today; and then Slow-Mag twice a day prescription sent.  # PN G-1-2 sec to Taxotere- monitor for now. STABLE  # DISPOSITION: # follow up in 4 weeks- MD;port flush-labs- cbc/cmp;Mag; possible 2 gm mag infusion- No chemo Dr.B    All questions were answered. The patient/family knows to call the clinic with any problems, questions or concerns.       Cammie Sickle, MD 03/18/2022 2:05 PM

## 2022-03-18 NOTE — Telephone Encounter (Signed)
Faxed to [facility] [phone]) and original given to patient on 03/18/22.

## 2022-03-18 NOTE — Assessment & Plan Note (Addendum)
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  Discussed with Dr. Sedalia Muta tumor heterogeneity. BREAST MRI- JUNE 2023- The patient's known malignancy in the upper outer quadrant of the right breast measures 3.8 x 2.8 x 3.6 cm. Numerous other suspicious masses are identified in the right breast as above involving the upper outer and upper inner quadrants, located both anterior and posterior to the known malignancy.  Numerous enhancing foci in the skin as above are worrisome for skin metastases; Two mildly prominent right internal mammary nodes were not FDG avid on recent PET-CT imaging.    # Currently s/p neoadjuvant chemotherapy- TCH plus P x6 cycles- CT CAP- OCT 9th, 2023-  Known right breast mass is obscured by adjacent breast tissue; Right breast skin thickening; The previous hypermetabolic low right axillary node has diminished in size, now only punctate; No evidence of metastatic disease in the chest, abdomen, or pelvis.   #Patient s/p evaluation with surgery-awaiting bilateral mastectomy; awaiting plastic surgery evaluation.  Discussed the most likely need for radiation given her inflammatory cancer.  Discussed with Dr. Augustine Radar plastic surgery; and also regarding need for evaluation in the underarm.  Discussed that patient will need adjuvant chemotherapy after her surgery.  Decisions to be made after final pathology is available.  #Incidental findings on Imaging  CT scan OCT 2023: Enlarged heterogeneous uterus with poorly defined endometrial complex.Hepatic steatosis. I reviewed/discussed/counseled the patient.  This will need to be followed up later.   MUGA scan May 31st, 2023- -61%.   # Electrolyet abomilaities- Hypokalemia/hypomagnesemia-potassium 3.6 today-recommend 1K today; magnesium 1.4-2 g of IV mag today; and then Slow-Mag twice a day prescription sent.  # PN G-1-2  sec to Taxotere- monitor for now. STABLE  # DISPOSITION: # follow up in 4 weeks- MD;port flush-labs- cbc/cmp;Mag; possible 2 gm mag infusion- No chemo Dr.B  # I reviewed the blood work- with the patient in detail; also reviewed the imaging independently [as summarized above]; and with the patient in detail.

## 2022-03-19 ENCOUNTER — Other Ambulatory Visit: Payer: Self-pay

## 2022-03-22 ENCOUNTER — Encounter: Payer: Self-pay | Admitting: Plastic Surgery

## 2022-03-22 ENCOUNTER — Ambulatory Visit (INDEPENDENT_AMBULATORY_CARE_PROVIDER_SITE_OTHER): Payer: 59 | Admitting: Plastic Surgery

## 2022-03-22 ENCOUNTER — Other Ambulatory Visit: Payer: Self-pay | Admitting: General Surgery

## 2022-03-22 VITALS — Ht 66.0 in | Wt 220.0 lb

## 2022-03-22 DIAGNOSIS — C50411 Malignant neoplasm of upper-outer quadrant of right female breast: Secondary | ICD-10-CM

## 2022-03-22 DIAGNOSIS — Z170 Estrogen receptor positive status [ER+]: Secondary | ICD-10-CM | POA: Diagnosis not present

## 2022-03-22 DIAGNOSIS — E1169 Type 2 diabetes mellitus with other specified complication: Secondary | ICD-10-CM | POA: Diagnosis not present

## 2022-03-22 NOTE — H&P (View-Only) (Signed)
Patient ID: Marilyn Page, female    DOB: 10/05/1975, 46 y.o.   MRN: [id number]   Chief Complaint  Patient presents with   Breast Cancer    The patient is a 46 year old female here for consultation for breast reconstruction.  She was taking a shower when she noticed a lump on her right breast.  She did not ignore it but thought it was just a cyst.  She is aware she has very dense breasts.  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  It measured 6 mm in size.  Her pathology showed estrogen and progesterone positive and HER2 positive.  He is 5 feet 6 inches tall and 220 pounds.  Her preoperative bra size is likely a C cup.  She like to be around about the same size.  She has diabetes.  She is not a smoker.  She was recently on Ozempic and stopped it but may restart it.  She does have her Port-A-Cath in place.  Radiation has been discussed and is a strong possibility.  There was question about skin involvement.  She does have dense breasts on exam and bilateral breast ptosis.     Review of Systems  Constitutional: Negative.   HENT: Negative.    Eyes: Negative.   Respiratory: Negative.    Cardiovascular: Negative.   Gastrointestinal: Negative.   Endocrine: Negative.   Genitourinary: Negative.   Musculoskeletal: Negative.   Hematological: Negative.   Psychiatric/Behavioral: Negative.      Past Medical History:  Diagnosis Date   Carcinoma of upper-outer quadrant of right breast in female, estrogen receptor positive ([place]) 10/26/2021   Diabetes mellitus without complication ([place])    History of gestational diabetes    Personal history of chemotherapy     Past Surgical History:  Procedure Laterality Date   BREAST BIOPSY Right 10/19/2021   [place] Core bx 10:00 6 cmfn Ribbon Clip-INVASIVE MAMMARY CARCINOMA,. Axilla Butterfly hydro clip-MACRO METASTATIC MAMMARY CARCINOMA   CESAREAN SECTION  2005/2008   DILATION  AND CURETTAGE OF UTERUS  2003   PORTACATH PLACEMENT Left 11/09/2021   Procedure: INSERTION PORT-A-CATH;  Surgeon: Robert Bellow, MD;  Location: [facility] OR[facility];  Service: General;  Laterality: Left;   SKIN BIOPSY Right 11/09/2021   Procedure: PUNCH BIOPSY SKIN, TATTOO LYMPH NODE RIGHT AXILLA;  Surgeon: Robert Bellow, MD;  Location: [facility];  Service: General;  Laterality: Right;      Current Outpatient Medications:    acetaminophen (TYLENOL) 325 MG tablet, Take 2 tablets (650 mg total) by mouth every 6 (six) hours as needed for mild pain (or Fever >/= 101)., Disp: , Rfl:    alum & mag hydroxide-simeth (MAALOX/MYLANTA) 200-200-20 MG/5ML suspension, Take 30 mLs by mouth every 4 (four) hours as needed for indigestion. (Patient not taking: Reported on 03/12/2022), Disp: 355 mL, Rfl: 0   ascorbic acid (VITAMIN C) 500 MG tablet, Take 1 tablet (500 mg total) by mouth daily., Disp: 30 tablet, Rfl:    calcium-vitamin D (OSCAL WITH D) 500-5 MG-MCG tablet, Take 1 tablet by mouth 2 (two) times daily., Disp: 60 tablet, Rfl: 2   Cyanocobalamin (VITAMIN B-12 PO), Take 1 tablet by mouth daily., Disp: , Rfl:    doxycycline (VIBRA-TABS) 100 MG tablet, Take 1 tablet (100 mg total) by mouth 2 (two) times daily for 10 days., Disp: 20 tablet, Rfl: 0   ferrous sulfate 325 (65 FE)  MG tablet, Take 325 mg by mouth 2 (two) times daily with a meal., Disp: , Rfl:    lidocaine-prilocaine (EMLA) cream, Apply on the port. 30 -45 min  prior to port access., Disp: 30 g, Rfl: 3   loperamide (IMODIUM) 2 MG capsule, Take 1 capsule (2 mg total) by mouth as needed for diarrhea or loose stools. (Patient not taking: Reported on 03/15/2022), Disp: 30 capsule, Rfl: 0   magic mouthwash w/lidocaine SOLN, Take 5 mLs by mouth 4 (four) times daily as needed for mouth pain. Sig: Swish/Swallow 5-10 ml four times a day as needed. Dispense 480 ml. 1RF (Patient not taking: Reported on 03/15/2022), Disp: 480 mL, Rfl: 1   Magnesium Cl-Calcium  Carbonate (SLOW MAGNESIUM/CALCIUM) 70-117 MG TBEC, Take 1 tablet by mouth 2 (two) times daily., Disp: 60 tablet, Rfl: 6   Multiple Vitamin (MULTIVITAMIN WITH MINERALS) TABS tablet, Take 1 tablet by mouth daily., Disp: , Rfl:    norethindrone (MICRONOR) 0.35 MG tablet, Take 1 tablet (0.35 mg total) by mouth daily., Disp: 84 tablet, Rfl: 2   ondansetron (ZOFRAN-ODT) 8 MG disintegrating tablet, Take 8 mg by mouth every 8 (eight) hours as needed for nausea or vomiting., Disp: , Rfl:    ONETOUCH VERIO test strip, SMARTSIG:1 Each Via Meter Daily PRN, Disp: , Rfl:    potassium chloride (KLOR-CON) 20 MEQ packet, Take 20 mEq by mouth 2 (two) times daily., Disp: 60 packet, Rfl: 3   prochlorperazine (COMPAZINE) 10 MG tablet, Take 1 tablet (10 mg total) by mouth every 6 (six) hours as needed for nausea or vomiting. (Patient not taking: Reported on 03/12/2022), Disp: 40 tablet, Rfl: 1   Semaglutide, 1 MG/DOSE, 4 MG/3ML SOPN, Inject into the skin. Inject 1 mg (0.75 ml) subcutaneously once a week. (Patient not taking: Reported on 03/15/2022), Disp: , Rfl:    Objective:   There were no vitals filed for this visit.  Physical Exam Constitutional:      Appearance: Normal appearance.  HENT:     Head: Normocephalic and atraumatic.  Cardiovascular:     Rate and Rhythm: Normal rate.     Pulses: Normal pulses.  Pulmonary:     Effort: Pulmonary effort is normal. No respiratory distress.  Abdominal:     General: There is no distension.     Palpations: Abdomen is soft.     Tenderness: There is no abdominal tenderness.  Skin:    General: Skin is warm.     Capillary Refill: Capillary refill takes less than 2 seconds.     Coloration: Skin is not jaundiced.  Neurological:     Mental Status: She is alert and oriented to person, place, and time.  Psychiatric:        Mood and Affect: Mood normal.        Behavior: Behavior normal.        Thought Content: Thought content normal.     Assessment & Plan:  Type 2  diabetes mellitus with other specified complication, without long-term current use of insulin (HCC)  Carcinoma of upper-outer quadrant of right breast in female, estrogen receptor positive ([place])  The options for reconstruction we explained to the patient / family for breast reconstruction.  There are two general categories of reconstruction.  We can reconstruction a breast with implants or use the patient's own tissue.  These were further discussed as listed.  Breast reconstruction is an optional procedure and eligibility depends on the full spectrum of the health of the patient and any  co-morbidities.  More than one surgery is often needed to complete the reconstruction process.  The process can take three to twelve months to complete.  The breasts will not be identical due to many factors such as rib differences, shoulder asymmetry and treatments such as radiation.  The goal is to get the breasts to look normal and symmetrical in clothes.  Scars are a part of surgery and may fade some in time but will always be present under clothes.  Surgery may be an option on the non-cancer breast to achieve more symmetry.  No matter which procedure is chosen there is always the risk of complications and even failure of the body to heal.  This could result in no breast.    The options for reconstruction include:  1. Placement of a tissue expander with Acellular dermal matrix. When the expander is the desired size surgery is performed to remove the expander and place an implant.  In some cases the implant can be placed without an expander.  2. Autologous reconstruction can include using a muscle or tissue from another area of the body to create a breast.  3. Combined procedures (ie. latissismus dorsi flap) can be done with an expander / implant placed under the muscle.   The risks, benefits, scars and recovery time were discussed for each of the above. Risks include bleeding, infection, hematoma, seroma, scarring,  pain, wound healing complications, flap loss, fat necrosis, capsular contracture, need for implant removal, donor site complications, bulge, hernia, umbilical necrosis, need for urgent reoperation, and need for dressing changes.   The procedure the patient selected / that was best for the patient, was then discussed in further detail.  Total time: 45 minutes. This includes time spent with the patient during the visit as well as time spent before and after the visit reviewing the chart, documenting the encounter, making phone calls and reviewing studies.   Due to the patient's BMI and diabetes she is not a great candidate for autologous reconstruction.  If she goes back on the Ozempic and her BMI goes down it could be an option for her in the future.  She would like to move ahead with right sided mastectomy with immediate reconstruction using expander and Flex HD.  I have a call out to Dr. Bary Castilla and will put in the request.  We will also send a prescription to [facility] for her preoperative bras.  Pictures were obtained of the patient and placed in the chart with the patient's or guardian's permission.   [place], DO

## 2022-03-22 NOTE — Progress Notes (Signed)
Patient ID: Marilyn Page, female    DOB: 10/05/1975, 46 y.o.   MRN: [id number]   Chief Complaint  Patient presents with   Breast Cancer    The patient is a 46 year old female here for consultation for breast reconstruction.  She was taking a shower when she noticed a lump on her right breast.  She did not ignore it but thought it was just a cyst.  She is aware she has very dense breasts.  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  It measured 6 mm in size.  Her pathology showed estrogen and progesterone positive and HER2 positive.  He is 5 feet 6 inches tall and 220 pounds.  Her preoperative bra size is likely a C cup.  She like to be around about the same size.  She has diabetes.  She is not a smoker.  She was recently on Ozempic and stopped it but may restart it.  She does have her Port-A-Cath in place.  Radiation has been discussed and is a strong possibility.  There was question about skin involvement.  She does have dense breasts on exam and bilateral breast ptosis.     Review of Systems  Constitutional: Negative.   HENT: Negative.    Eyes: Negative.   Respiratory: Negative.    Cardiovascular: Negative.   Gastrointestinal: Negative.   Endocrine: Negative.   Genitourinary: Negative.   Musculoskeletal: Negative.   Hematological: Negative.   Psychiatric/Behavioral: Negative.      Past Medical History:  Diagnosis Date   Carcinoma of upper-outer quadrant of right breast in female, estrogen receptor positive ([place]) 10/26/2021   Diabetes mellitus without complication ([place])    History of gestational diabetes    Personal history of chemotherapy     Past Surgical History:  Procedure Laterality Date   BREAST BIOPSY Right 10/19/2021   [place] Core bx 10:00 6 cmfn Ribbon Clip-INVASIVE MAMMARY CARCINOMA,. Axilla Butterfly hydro clip-MACRO METASTATIC MAMMARY CARCINOMA   CESAREAN SECTION  2005/2008   DILATION  AND CURETTAGE OF UTERUS  2003   PORTACATH PLACEMENT Left 11/09/2021   Procedure: INSERTION PORT-A-CATH;  Surgeon: Robert Bellow, MD;  Location: [facility] OR[facility];  Service: General;  Laterality: Left;   SKIN BIOPSY Right 11/09/2021   Procedure: PUNCH BIOPSY SKIN, TATTOO LYMPH NODE RIGHT AXILLA;  Surgeon: Robert Bellow, MD;  Location: [facility];  Service: General;  Laterality: Right;      Current Outpatient Medications:    acetaminophen (TYLENOL) 325 MG tablet, Take 2 tablets (650 mg total) by mouth every 6 (six) hours as needed for mild pain (or Fever >/= 101)., Disp: , Rfl:    alum & mag hydroxide-simeth (MAALOX/MYLANTA) 200-200-20 MG/5ML suspension, Take 30 mLs by mouth every 4 (four) hours as needed for indigestion. (Patient not taking: Reported on 03/12/2022), Disp: 355 mL, Rfl: 0   ascorbic acid (VITAMIN C) 500 MG tablet, Take 1 tablet (500 mg total) by mouth daily., Disp: 30 tablet, Rfl:    calcium-vitamin D (OSCAL WITH D) 500-5 MG-MCG tablet, Take 1 tablet by mouth 2 (two) times daily., Disp: 60 tablet, Rfl: 2   Cyanocobalamin (VITAMIN B-12 PO), Take 1 tablet by mouth daily., Disp: , Rfl:    doxycycline (VIBRA-TABS) 100 MG tablet, Take 1 tablet (100 mg total) by mouth 2 (two) times daily for 10 days., Disp: 20 tablet, Rfl: 0   ferrous sulfate 325 (65 FE)  MG tablet, Take 325 mg by mouth 2 (two) times daily with a meal., Disp: , Rfl:    lidocaine-prilocaine (EMLA) cream, Apply on the port. 30 -45 min  prior to port access., Disp: 30 g, Rfl: 3   loperamide (IMODIUM) 2 MG capsule, Take 1 capsule (2 mg total) by mouth as needed for diarrhea or loose stools. (Patient not taking: Reported on 03/15/2022), Disp: 30 capsule, Rfl: 0   magic mouthwash w/lidocaine SOLN, Take 5 mLs by mouth 4 (four) times daily as needed for mouth pain. Sig: Swish/Swallow 5-10 ml four times a day as needed. Dispense 480 ml. 1RF (Patient not taking: Reported on 03/15/2022), Disp: 480 mL, Rfl: 1   Magnesium Cl-Calcium  Carbonate (SLOW MAGNESIUM/CALCIUM) 70-117 MG TBEC, Take 1 tablet by mouth 2 (two) times daily., Disp: 60 tablet, Rfl: 6   Multiple Vitamin (MULTIVITAMIN WITH MINERALS) TABS tablet, Take 1 tablet by mouth daily., Disp: , Rfl:    norethindrone (MICRONOR) 0.35 MG tablet, Take 1 tablet (0.35 mg total) by mouth daily., Disp: 84 tablet, Rfl: 2   ondansetron (ZOFRAN-ODT) 8 MG disintegrating tablet, Take 8 mg by mouth every 8 (eight) hours as needed for nausea or vomiting., Disp: , Rfl:    ONETOUCH VERIO test strip, SMARTSIG:1 Each Via Meter Daily PRN, Disp: , Rfl:    potassium chloride (KLOR-CON) 20 MEQ packet, Take 20 mEq by mouth 2 (two) times daily., Disp: 60 packet, Rfl: 3   prochlorperazine (COMPAZINE) 10 MG tablet, Take 1 tablet (10 mg total) by mouth every 6 (six) hours as needed for nausea or vomiting. (Patient not taking: Reported on 03/12/2022), Disp: 40 tablet, Rfl: 1   Semaglutide, 1 MG/DOSE, 4 MG/3ML SOPN, Inject into the skin. Inject 1 mg (0.75 ml) subcutaneously once a week. (Patient not taking: Reported on 03/15/2022), Disp: , Rfl:    Objective:   There were no vitals filed for this visit.  Physical Exam Constitutional:      Appearance: Normal appearance.  HENT:     Head: Normocephalic and atraumatic.  Cardiovascular:     Rate and Rhythm: Normal rate.     Pulses: Normal pulses.  Pulmonary:     Effort: Pulmonary effort is normal. No respiratory distress.  Abdominal:     General: There is no distension.     Palpations: Abdomen is soft.     Tenderness: There is no abdominal tenderness.  Skin:    General: Skin is warm.     Capillary Refill: Capillary refill takes less than 2 seconds.     Coloration: Skin is not jaundiced.  Neurological:     Mental Status: She is alert and oriented to person, place, and time.  Psychiatric:        Mood and Affect: Mood normal.        Behavior: Behavior normal.        Thought Content: Thought content normal.     Assessment & Plan:  Type 2  diabetes mellitus with other specified complication, without long-term current use of insulin (HCC)  Carcinoma of upper-outer quadrant of right breast in female, estrogen receptor positive ([place])  The options for reconstruction we explained to the patient / family for breast reconstruction.  There are two general categories of reconstruction.  We can reconstruction a breast with implants or use the patient's own tissue.  These were further discussed as listed.  Breast reconstruction is an optional procedure and eligibility depends on the full spectrum of the health of the patient and any  co-morbidities.  More than one surgery is often needed to complete the reconstruction process.  The process can take three to twelve months to complete.  The breasts will not be identical due to many factors such as rib differences, shoulder asymmetry and treatments such as radiation.  The goal is to get the breasts to look normal and symmetrical in clothes.  Scars are a part of surgery and may fade some in time but will always be present under clothes.  Surgery may be an option on the non-cancer breast to achieve more symmetry.  No matter which procedure is chosen there is always the risk of complications and even failure of the body to heal.  This could result in no breast.    The options for reconstruction include:  1. Placement of a tissue expander with Acellular dermal matrix. When the expander is the desired size surgery is performed to remove the expander and place an implant.  In some cases the implant can be placed without an expander.  2. Autologous reconstruction can include using a muscle or tissue from another area of the body to create a breast.  3. Combined procedures (ie. latissismus dorsi flap) can be done with an expander / implant placed under the muscle.   The risks, benefits, scars and recovery time were discussed for each of the above. Risks include bleeding, infection, hematoma, seroma, scarring,  pain, wound healing complications, flap loss, fat necrosis, capsular contracture, need for implant removal, donor site complications, bulge, hernia, umbilical necrosis, need for urgent reoperation, and need for dressing changes.   The procedure the patient selected / that was best for the patient, was then discussed in further detail.  Total time: 45 minutes. This includes time spent with the patient during the visit as well as time spent before and after the visit reviewing the chart, documenting the encounter, making phone calls and reviewing studies.   Due to the patient's BMI and diabetes she is not a great candidate for autologous reconstruction.  If she goes back on the Ozempic and her BMI goes down it could be an option for her in the future.  She would like to move ahead with right sided mastectomy with immediate reconstruction using expander and Flex HD.  I have a call out to Dr. Bary Castilla and will put in the request.  We will also send a prescription to [facility] for her preoperative bras.  Pictures were obtained of the patient and placed in the chart with the patient's or guardian's permission.   [place], DO

## 2022-03-23 ENCOUNTER — Other Ambulatory Visit: Payer: Self-pay

## 2022-03-24 ENCOUNTER — Other Ambulatory Visit: Payer: Self-pay

## 2022-03-24 ENCOUNTER — Telehealth: Payer: Self-pay | Admitting: *Deleted

## 2022-03-24 ENCOUNTER — Inpatient Hospital Stay: Payer: 59

## 2022-03-24 ENCOUNTER — Encounter: Payer: Self-pay | Admitting: Internal Medicine

## 2022-03-24 ENCOUNTER — Inpatient Hospital Stay (HOSPITAL_BASED_OUTPATIENT_CLINIC_OR_DEPARTMENT_OTHER): Payer: 59 | Admitting: Medical Oncology

## 2022-03-24 ENCOUNTER — Encounter: Payer: Self-pay | Admitting: Medical Oncology

## 2022-03-24 VITALS — BP 107/75 | HR 89 | Temp 97.6°F | Resp 18 | Ht 66.0 in | Wt 229.2 lb

## 2022-03-24 DIAGNOSIS — C50411 Malignant neoplasm of upper-outer quadrant of right female breast: Secondary | ICD-10-CM | POA: Diagnosis not present

## 2022-03-24 DIAGNOSIS — M7989 Other specified soft tissue disorders: Secondary | ICD-10-CM

## 2022-03-24 DIAGNOSIS — D500 Iron deficiency anemia secondary to blood loss (chronic): Secondary | ICD-10-CM

## 2022-03-24 DIAGNOSIS — R600 Localized edema: Secondary | ICD-10-CM

## 2022-03-24 DIAGNOSIS — Z95828 Presence of other vascular implants and grafts: Secondary | ICD-10-CM

## 2022-03-24 LAB — CBC WITH DIFFERENTIAL/PLATELET
Abs Immature Granulocytes: 0.06 10*3/uL (ref 0.00–0.07)
Basophils Absolute: 0.1 10*3/uL (ref 0.0–0.1)
Basophils Relative: 1 %
Eosinophils Absolute: 0 10*3/uL (ref 0.0–0.5)
Eosinophils Relative: 1 %
HCT: 29.8 % — ABNORMAL LOW (ref 36.0–46.0)
Hemoglobin: 9.5 g/dL — ABNORMAL LOW (ref 12.0–15.0)
Immature Granulocytes: 1 %
Lymphocytes Relative: 27 %
Lymphs Abs: 1.5 10*3/uL (ref 0.7–4.0)
MCH: 33.8 pg (ref 26.0–34.0)
MCHC: 31.9 g/dL (ref 30.0–36.0)
MCV: 106 fL — ABNORMAL HIGH (ref 80.0–100.0)
Monocytes Absolute: 0.6 10*3/uL (ref 0.1–1.0)
Monocytes Relative: 10 %
Neutro Abs: 3.5 10*3/uL (ref 1.7–7.7)
Neutrophils Relative %: 60 %
Platelets: 191 10*3/uL (ref 150–400)
RBC: 2.81 MIL/uL — ABNORMAL LOW (ref 3.87–5.11)
RDW: 17.1 % — ABNORMAL HIGH (ref 11.5–15.5)
WBC: 5.8 10*3/uL (ref 4.0–10.5)
nRBC: 0 % (ref 0.0–0.2)

## 2022-03-24 LAB — COMPREHENSIVE METABOLIC PANEL
ALT: 15 U/L (ref 0–44)
AST: 28 U/L (ref 15–41)
Albumin: 2.7 g/dL — ABNORMAL LOW (ref 3.5–5.0)
Alkaline Phosphatase: 42 U/L (ref 38–126)
Anion gap: 7 (ref 5–15)
BUN: 12 mg/dL (ref 6–20)
CO2: 26 mmol/L (ref 22–32)
Calcium: 8.3 mg/dL — ABNORMAL LOW (ref 8.9–10.3)
Chloride: 106 mmol/L (ref 98–111)
Creatinine, Ser: 0.73 mg/dL (ref 0.44–1.00)
GFR, Estimated: >60 mL/min (ref >60)
Glucose, Bld: 145 mg/dL — ABNORMAL HIGH (ref 70–99)
Potassium: 3.7 mmol/L (ref 3.5–5.1)
Sodium: 139 mmol/L (ref 135–145)
Total Bilirubin: 0.5 mg/dL (ref 0.3–1.2)
Total Protein: 5.3 g/dL — ABNORMAL LOW (ref 6.5–8.1)

## 2022-03-24 LAB — MAGNESIUM: Magnesium: 1.5 mg/dL — ABNORMAL LOW (ref 1.7–2.4)

## 2022-03-24 LAB — BRAIN NATRIURETIC PEPTIDE: B Natriuretic Peptide: 21.1 pg/mL (ref 0.0–100.0)

## 2022-03-24 MED ORDER — SODIUM CHLORIDE 0.9% FLUSH
10.0000 mL | Freq: Once | INTRAVENOUS | Status: AC
  Administered 2022-03-24: 10 mL via INTRAVENOUS
  Filled 2022-03-24: qty 10

## 2022-03-24 MED ORDER — HEPARIN SOD (PORK) LOCK FLUSH 100 UNIT/ML IV SOLN
500.0000 [IU] | Freq: Once | INTRAVENOUS | Status: AC
  Administered 2022-03-24: 500 [IU]
  Filled 2022-03-24: qty 5

## 2022-03-24 NOTE — Telephone Encounter (Signed)
Pt sch for [facility]

## 2022-03-24 NOTE — Progress Notes (Addendum)
Symptom Management [place] at [facility] [phone] [phone]  Patient Care Team: Latanya Maudlin, NP as PCP - General (Family Medicine) Daiva Huge, RN as Oncology Nurse Navigator Cammie Sickle, [name]   Name of the patient: Marilyn Page  [id number]  03-19-1976   Date of visit: 03/24/22  Reason for Consult: Marilyn Page is a 46 y.o. female with right breast TRIPLE POSITIVE with T4- Stage III currently s/p neoadjuvant TCH plus P chemotherapy #6 who presents today for:   Edema of the feet: Patient has noticed some swelling of her bilateral feet over the past 2-3 days. Socks are leaving an indention on her feet at the end of the day where they didn't before. She reports no calf pain, SOB, erythema. Weight is up but she has been eating again as food is starting to taste like normal compared to when she was on chemotherapy. Overall feeling ok other than this.    Denies any neurologic complaints. Denies recent fevers or illnesses. Denies any easy bleeding or bruising. Reports good appetite and denies weight loss. Denies chest pain. Denies any nausea, vomiting, constipation, or diarrhea. Denies urinary complaints. Patient offers no further specific complaints today.  Wt Readings from Last 3 Encounters:  03/24/22 229 lb 3.2 oz (104 kg)  03/22/22 220 lb (99.8 kg)  03/18/22 221 lb 6.4 oz (100.4 kg)     PAST MEDICAL HISTORY: Past Medical History:  Diagnosis Date   Carcinoma of upper-outer quadrant of right breast in female, estrogen receptor positive ([place]) 10/26/2021   Diabetes mellitus without complication ([place])    History of gestational diabetes    Personal history of chemotherapy     PAST SURGICAL HISTORY:  Past Surgical History:  Procedure Laterality Date   BREAST BIOPSY Right 10/19/2021   [place] Core bx 10:00 6 cmfn Ribbon Clip-INVASIVE MAMMARY CARCINOMA,. Axilla Butterfly hydro  clip-MACRO METASTATIC MAMMARY CARCINOMA   CESAREAN SECTION  2005/2008   DILATION AND CURETTAGE OF UTERUS  2003   PORTACATH PLACEMENT Left 11/09/2021   Procedure: INSERTION PORT-A-CATH;  Surgeon: Robert Bellow, MD;  Location: [facility] ORS;  Service: General;  Laterality: Left;   SKIN BIOPSY Right 11/09/2021   Procedure: PUNCH BIOPSY SKIN, TATTOO LYMPH NODE RIGHT AXILLA;  Surgeon: Robert Bellow, MD;  Location: [facility] ORS;  Service: General;  Laterality: Right;    HEMATOLOGY/ONCOLOGY HISTORY:  Oncology History Overview Note  MAY 2023-    Targeted ultrasound is performed, showing a 2.9 x 1.9 x 2.0 cm irregular hypoechoic mass right breast 10 o'clock position 6 cm from nipple at the site of palpable concern.   There is an abnormal 8 mm thickened right axillary lymph node.   There is skin thickening overlying the right breast. ------------------  A. BREAST, RIGHT AT 10:00, 6 CM FROM THE NIPPLE; ULTRASOUND-GUIDED CORE  NEEDLE BIOPSY:  - INVASIVE MAMMARY CARCINOMA, NO SPECIAL TYPE.   Size of invasive carcinoma: 11 mm in this sample  Histologic grade of invasive carcinoma: Grade 2                       Glandular/tubular differentiation score: 3                       Nuclear pleomorphism score: 2                       Mitotic rate  score: 1                       Total score: 6  Ductal carcinoma in situ: Present, intermediate grade  Lymphovascular invasion: Not identified   B. LYMPH NODE, RIGHT AXILLARY; ULTRASOUND-GUIDED CORE NEEDLE BIOPSY:  - 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 IN GREATEST  EXTENT.   Comment:  The malignancy in the primary breast lesion appears morphologically  different from tumor in the lymph node sampling, with tumor present in  lymph node more suggestive of a histologic grade 3, with significantly  increased mitotic activity and pleomorphism. Due to the discrepancy in  these morphologic patterns, ER/PR/HER2 immunohistochemistry will be   performed on both blocks A1 and B1, with reflex to [place] for HER2 2+. The  results will be reported in an addendum.   ADDENDUM:  CASE SUMMARY: BREAST BIOMARKER TESTS - A - BREAST, RIGHT AT 10:00  Estrogen Receptor (ER) Status: POSITIVE          Percentage of cells with nuclear positivity: 71-80%          Average intensity of staining: Strong   Progesterone Receptor (PgR) Status: POSITIVE          Percentage of cells with nuclear positivity: 81-90%          Average intensity of staining: Strong   HER2 (by immunohistochemistry): POSITIVE (Score 3+)       Percentage of cells with uniform intense complete membrane  staining: 61-70%  HER2 displays a heterogeneous staining pattern, with areas showing a  strong 3+ staining pattern, and other regions with complete absence (0+)  of staining.   Ki-67: Not performed   CASE SUMMARY: BREAST BIOMARKER TESTS - B - RIGHT AXILLARY LYMPH NODE  Estrogen Receptor (ER) Status: POSITIVE          Percentage of cells with nuclear positivity: 81-90%          Average intensity of staining: Strong   Progesterone Receptor (PgR) Status: POSITIVE          Percentage of cells with nuclear positivity: 51-60%          Average intensity of staining: Strong   HER2 (by immunohistochemistry): NEGATIVE (Score 1+)  Ki-67: Not performed   #  MAY 2023- STAGE III-T4N1 ER/PR positive HER2 POSITIVE right breast cancer;LN-positive-ER/PR positive however 2 NEGATIVE s/p biopsy [see discussion below].  Discussed with Dr. Sedalia Muta tumor heterogeneity. BREAST MRI- JUNE 2023- The patient's known malignancy in the upper outer quadrant of the right breast measures 3.8 x 2.8 x 3.6 cm. Numerous other suspicious masses are identified in the right breast as above involving the upper outer and upper inner quadrants, located both anterior and posterior to the known malignancy.  Numerous enhancing foci in the skin as above are worrisome for skin metastases ; Bx proven. Biopsy proven  metastatic node in the right axilla.  No evidence of malignancy in the left breast;  Two mildly prominent right internal mammary nodes were not FDG avid on recent PET-CT imaging.  Currently on neoadjuvant chemotherapy- TCH plus P x6 cycles;  MUGA scan May 31st, 2023- -61%.    # June 7th, 2023- NEO-ADJUVANT CHEMO- TCH+P   # Genetic counseling s/p- NEG for any deleterious mutations.     Carcinoma of upper-outer quadrant of right breast in female, estrogen receptor positive ([place])  10/26/2021 Initial Diagnosis   Carcinoma of upper-outer quadrant of right breast in female, estrogen receptor positive (  [place])   10/26/2021 Cancer Staging   Staging form: Breast, AJCC 8th Edition - Clinical: Stage IB (cT2, cN1, cM0, G2, ER+, PR+, HER2+) - Signed by Cammie Sickle, MD on 10/26/2021 Histologic grading system: 3 grade system   11/10/2021 -  Chemotherapy   Patient is on Treatment Plan : BREAST  Docetaxel + Carboplatin + Trastuzumab + Pertuzumab  (TCHP) q21d      11/11/2021 - 01/13/2022 Chemotherapy   Patient is on Treatment Plan : BREAST  Docetaxel + Carboplatin + Trastuzumab + Pertuzumab  (TCHP) q21d       Genetic Testing   Negative genetic testing. No pathogenic variants identified on the [facility] CancerNext-Expanded+RNA panel. The report date is 11/29/2021.  The CancerNext-Expanded + RNAinsight gene panel offered by [employer] and includes sequencing and rearrangement analysis for the following 77 genes: IP, ALK, APC*, ATM*, AXIN2, BAP1, BARD1, BLM, BMPR1A, BRCA1*, BRCA2*, BRIP1*, CDC73, CDH1*,CDK4, CDKN1B, CDKN2A, CHEK2*, CTNNA1, DICER1, FANCC, FH, FLCN, GALNT12, KIF1B, LZTR1, MAX, MEN1, MET, MLH1*, MSH2*, MSH3, MSH6*, MUTYH*, NBN, NF1*, NF2, NTHL1, PALB2*, PHOX2B, PMS2*, POT1, PRKAR1A, PTCH1, PTEN*, RAD51C*, RAD51D*,RB1, RECQL, RET, SDHA, SDHAF2, SDHB, SDHC, SDHD, SMAD4, SMARCA4, SMARCB1, SMARCE1, STK11, SUFU, TMEM127, TP53*,TSC1, TSC2, VHL and XRCC2 (sequencing and deletion/duplication); EGFR, EGLN1,  HOXB13, KIT, MITF, PDGFRA, POLD1 and POLE (sequencing only); EPCAM and GREM1 (deletion/duplication only).     ALLERGIES:  is allergic to metformin.  MEDICATIONS:  Current Outpatient Medications  Medication Sig Dispense Refill   acetaminophen (TYLENOL) 325 MG tablet Take 2 tablets (650 mg total) by mouth every 6 (six) hours as needed for mild pain (or Fever >/= 101).     ascorbic acid (VITAMIN C) 500 MG tablet Take 1 tablet (500 mg total) by mouth daily. 30 tablet    calcium-vitamin D (OSCAL WITH D) 500-5 MG-MCG tablet Take 1 tablet by mouth 2 (two) times daily. 60 tablet 2   Cyanocobalamin (VITAMIN B-12 PO) Take 1 tablet by mouth daily.     doxycycline (VIBRA-TABS) 100 MG tablet Take 1 tablet (100 mg total) by mouth 2 (two) times daily for 10 days. 20 tablet 0   ferrous sulfate 325 (65 FE) MG tablet Take 325 mg by mouth 2 (two) times daily with a meal.     lidocaine-prilocaine (EMLA) cream Apply on the port. 30 -45 min  prior to port access. 30 g 3   loperamide (IMODIUM) 2 MG capsule Take 1 capsule (2 mg total) by mouth as needed for diarrhea or loose stools. 30 capsule 0   magic mouthwash w/lidocaine SOLN Take 5 mLs by mouth 4 (four) times daily as needed for mouth pain. Sig: Swish/Swallow 5-10 ml four times a day as needed. Dispense 480 ml. 1RF 480 mL 1   Magnesium Cl-Calcium Carbonate (SLOW MAGNESIUM/CALCIUM) 70-117 MG TBEC Take 1 tablet by mouth 2 (two) times daily. 60 tablet 6   Multiple Vitamin (MULTIVITAMIN WITH MINERALS) TABS tablet Take 1 tablet by mouth daily.     norethindrone (MICRONOR) 0.35 MG tablet Take 1 tablet (0.35 mg total) by mouth daily. 84 tablet 2   ondansetron (ZOFRAN-ODT) 8 MG disintegrating tablet Take 8 mg by mouth every 8 (eight) hours as needed for nausea or vomiting.     ONETOUCH VERIO test strip SMARTSIG:1 Each Via Meter Daily PRN     potassium chloride (KLOR-CON) 20 MEQ packet Take 20 mEq by mouth 2 (two) times daily. 60 packet 3   prochlorperazine (COMPAZINE)  10 MG tablet Take 1 tablet (10 mg total) by mouth  every 6 (six) hours as needed for nausea or vomiting. 40 tablet 1   Semaglutide, 1 MG/DOSE, 4 MG/3ML SOPN Inject into the skin. Inject 1 mg (0.75 ml) subcutaneously once a week.     alum & mag hydroxide-simeth (MAALOX/MYLANTA) 200-200-20 MG/5ML suspension Take 30 mLs by mouth every 4 (four) hours as needed for indigestion. (Patient not taking: Reported on 03/24/2022) 355 mL 0   No current facility-administered medications for this visit.    VITAL SIGNS: BP 107/75   Pulse 89   Temp 97.6 F (36.4 C) (Tympanic)   Resp 18   Ht 5' 6"  (1.676 m)   Wt 229 lb 3.2 oz (104 kg)   BMI 36.99 kg/m  Filed Weights   03/24/22 0923  Weight: 229 lb 3.2 oz (104 kg)    Estimated body mass index is 36.99 kg/m as calculated from the following:   Height as of this encounter: 5' 6"  (1.676 m).   Weight as of this encounter: 229 lb 3.2 oz (104 kg).  LABS: CBC:    Component Value Date/Time   WBC 5.8 03/24/2022 0914   HGB 9.5 (L) 03/24/2022 0914   HGB 7.8 (L) 04/23/2021 1431   HCT 29.8 (L) 03/24/2022 0914   HCT 25.5 (L) 04/23/2021 1431   PLT 191 03/24/2022 0914   PLT 384 04/23/2021 1431   MCV 106.0 (H) 03/24/2022 0914   MCV 84 04/23/2021 1431   NEUTROABS 3.5 03/24/2022 0914   NEUTROABS 5.4 04/23/2021 1431   LYMPHSABS 1.5 03/24/2022 0914   LYMPHSABS 2.2 04/23/2021 1431   MONOABS 0.6 03/24/2022 0914   EOSABS 0.0 03/24/2022 0914   EOSABS 0.2 04/23/2021 1431   BASOSABS 0.1 03/24/2022 0914   BASOSABS 0.1 04/23/2021 1431   Comprehensive Metabolic Panel:    Component Value Date/Time   NA 139 03/24/2022 0914   NA 143 09/26/2017 1512   K 3.7 03/24/2022 0914   CL 106 03/24/2022 0914   CO2 26 03/24/2022 0914   BUN 12 03/24/2022 0914   BUN 9 09/26/2017 1512   CREATININE 0.73 03/24/2022 0914   GLUCOSE 145 (H) 03/24/2022 0914   CALCIUM 8.3 (L) 03/24/2022 0914   AST 28 03/24/2022 0914   ALT 15 03/24/2022 0914   ALKPHOS 42 03/24/2022 0914   BILITOT  0.5 03/24/2022 0914   BILITOT <0.2 09/26/2017 1512   PROT 5.3 (L) 03/24/2022 0914   PROT 6.8 09/26/2017 1512   ALBUMIN 2.7 (L) 03/24/2022 0914   ALBUMIN 3.9 09/26/2017 1512    RADIOGRAPHIC STUDIES: CT CHEST ABDOMEN PELVIS W CONTRAST  Result Date: 03/15/2022 CLINICAL DATA:  Carcinoma of upper outer quadrant of right breast in female, estrogen receptor positive. Breast cancer, invasive, initial workup. EXAM: CT CHEST, ABDOMEN, AND PELVIS WITH CONTRAST TECHNIQUE: Multidetector CT imaging of the chest, abdomen and pelvis was performed following the standard protocol during bolus administration of intravenous contrast. RADIATION DOSE REDUCTION: This exam was performed according to the departmental dose-optimization program which includes automated exposure control, adjustment of the mA and/or kV according to patient size and/or use of iterative reconstruction technique. CONTRAST:  17m OMNIPAQUE IOHEXOL 300 MG/ML  SOLN COMPARISON:  Chest CTA 12/28/2021, abdominal CT 12/27/2021. PET CT 11/10/2021 FINDINGS: CT CHEST FINDINGS Cardiovascular: Left chest port with tip in the atrial caval junction. The heart is normal in size. No pericardial effusion or thickening. Normal caliber thoracic aorta. Mediastinum/Nodes: Stable small mediastinal lymph nodes, none of which are enlarged by size criteria. There is no hilar adenopathy. The previous low right  axillary node that was hypermetabolic has diminished in size, only punctate. No enlarged axillary lymph nodes. No supraclavicular or internal mammary adenopathy. The esophagus is decompressed. Lungs/Pleura: No pulmonary nodule. No focal airspace disease. There is a trace left pleural effusion that was new from prior exam. No enhancement or nodularity. The trachea and central bronchi are patent. Musculoskeletal: No focal bone lesion or acute osseous findings. Known right breast mass is obscured by adjacent breast tissue. There is right breast skin thickening. CT ABDOMEN  PELVIS FINDINGS Hepatobiliary: No cystic or solid liver lesion. Mild decreased hepatic density typical of steatosis. Gallbladder physiologically distended, no calcified stone. No biliary dilatation. Pancreas: No pancreatic mass.  No ductal dilatation or inflammation. Spleen: Upper normal in size but normal in volume. No focal splenic abnormality. Adrenals/Urinary Tract: No discrete adrenal nodule. Low-density thickening of the left adrenal gland is stable from prior exams and not hypermetabolic on prior PET. No hydronephrosis, renal calculi or focal renal abnormality. Decompressed urinary bladder, not well assessed due to nondistention but noninflamed. Stomach/Bowel: Physiologically distended stomach. No bowel obstruction or inflammatory change. The appendix is normal. Small-moderate colonic stool burden. Vascular/Lymphatic: Nonspecific 11 mm left inguinal lymph node, typically reactive. Small mesenteric and retroperitoneal nodes are stable from prior exam and not enlarged by size criteria. Normal caliber abdominal aorta. No acute vascular findings. Reproductive: Enlarged uterus at is heterogeneous, the endometrial complex is poorly defined. No adnexal mass. Other: No ascites or free fluid. No omental thickening. Tiny fat containing umbilical hernia. Musculoskeletal: No focal bone lesions or acute osseous findings. Mild L4-L5 and L5-S1 degenerative disc disease. IMPRESSION: 1. Known right breast mass is obscured by adjacent breast tissue. Right breast skin thickening. 2. The previous hypermetabolic low right axillary node has diminished in size, now only punctate. 3. No evidence of metastatic disease in the chest, abdomen, or pelvis. 4. Enlarged heterogeneous uterus with poorly defined endometrial complex. Previous pelvic ultrasounds were suspicious for adenomyosis. 5. Hepatic steatosis. Electronically Signed   By: Keith Rake M.D.   On: 03/15/2022 11:46   DG Chest 2 View  Result Date: 03/05/2022 CLINICAL  DATA:  Fever, breast cancer EXAM: CHEST - 2 VIEW COMPARISON:  12/27/2021 FINDINGS: Frontal and lateral views of the chest demonstrates stable left chest wall port. Cardiac silhouette is unremarkable. No acute airspace disease, effusion, or pneumothorax. No acute bony abnormalities. IMPRESSION: 1. No acute intrathoracic process. Electronically Signed   By: Randa Ngo M.D.   On: 03/05/2022 21:30    PERFORMANCE STATUS (ECOG) : 1 - Symptomatic but completely ambulatory  Review of Systems Unless otherwise noted, a complete review of systems is negative.  Physical Exam General: NAD Cardiovascular: regular rate and rhythm Pulmonary: clear ant fields Abdomen: soft, nontender, + bowel sounds GU: no suprapubic tenderness Extremities: Scant non-pitting edema of bilateral feet not extending into legs, no joint deformities MSK: Negative Homans sign Skin: no rashes, no erythema Neurological: Weakness but otherwise nonfocal  Assessment and Plan- Patient is a 46 y.o. female    Encounter Diagnoses  Name Primary?   Hypomagnesemia Yes   Foot swelling     Foot swelling: New. Given her past chemotherapy treatment I wanted to check a BNP to ensure that this was normal. Pending at time of visit. Weight gain attributed to increase eating. Likely eating more salt due to increase appetite. Review of her labs shows a low protein level. May have some free spacing of fluid due to this. She will stay hydrated as she has been, reduce  salt, increase protein and calcium intake. RTC 1 week.  Chronic in nature. Magnesium is still a bit low. She will continue home magnesium supplementation which she just started yesterday- which is why it has improved some but not as much as we would have expected should she have started it days ago.   Disposition: Today- No fluids or supplementation needed Continue magnesium supplement RTC next week [facility], labs (CBC, CMP, mag)  Patient expressed understanding and was in agreement  with this plan. She also understands that She can call clinic at any time with any questions, concerns, or complaints.   Thank you for allowing me to participate in the care of this very pleasant patient.   Time Total: 25  Visit consisted of counseling and education dealing with the complex and emotionally intense issues of symptom management in the setting of serious illness.Greater than 50%  of this time was spent counseling and coordinating care related to the above assessment and plan.  Signed by: Nelwyn Salisbury, PA-C

## 2022-03-24 NOTE — Progress Notes (Signed)
Patient evaluated in smc today for c/o pitting edema- in ankles - bilaterally.

## 2022-03-24 NOTE — Progress Notes (Signed)
Patient does not require any iv fluids today.

## 2022-03-24 NOTE — Telephone Encounter (Signed)
Called patient for smc apt due to pt's concerns of lower ext edema. Patient can arrive by 915.

## 2022-03-26 LAB — METHYLMALONIC ACID, SERUM: Methylmalonic Acid, Quantitative: 191 nmol/L (ref 0–378)

## 2022-03-29 ENCOUNTER — Telehealth: Payer: Self-pay | Admitting: *Deleted

## 2022-03-29 ENCOUNTER — Other Ambulatory Visit: Payer: Self-pay | Admitting: General Surgery

## 2022-03-29 DIAGNOSIS — C50411 Malignant neoplasm of upper-outer quadrant of right female breast: Secondary | ICD-10-CM

## 2022-03-29 NOTE — Telephone Encounter (Signed)
Message sent to [facility] @ [place] office with 3 possible sx dates.

## 2022-03-29 NOTE — Progress Notes (Signed)
Progress Notes - documented in this encounter Denetra Formoso, Geronimo Boot, MD - 03/11/2022 2:30 PM EDT Formatting of this note is different from the original. Images from the original note were not included. Subjective:   Patient ID: Marilyn Page is a 46 y.o. female.  HPI  The following portions of the patient's history were reviewed and updated as appropriate.  This an established patient is here today for: office visit. Patient is here to discuss treatment options for right breast cancer. She recently finished chemotherapy on 02-26-22.   The patient is a Print production planner. Her husband is a Agricultural consultant for the city of [place].   She is here with her husband, Marilyn Page.  The patient reports her bra size is a 38C.  Chief Complaint  Patient presents with  Treatment Plan Discussion    BP 118/64  Pulse 107  Temp 37 C (98.6 F)  Ht 167.6 cm (_0 )  Wt 96.2 kg (212 lb)  LMP 09/05/2021  SpO2 99%  BMI 34.22 kg/m   Past Medical History:  Diagnosis Date  Breast cancer (CMS-HCC) 10/19/2021  INVASIVE MAMMARY CARCINOMA,  History of chemotherapy 2023  History of gestational diabetes 10/03/2017  S/P lymph node biopsy 10/19/2021  right. 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 IN GREATEST    Past Surgical History:  Procedure Laterality Date  DILATION AND CURETTAGE, DIAGNOSTIC / THERAPEUTIC 2003  BREAST EXCISIONAL BIOPSY Right 10/19/2021  port a cath placement 11/09/2021  CESAREAN SECTION  2005, 2008    OB History   Gravida  4  Para  2  Term   Preterm   AB  1  Living     SAB  1  IAB   Ectopic   Molar   Multiple   Live Births     Obstetric Comments  Age at first period 73 Age of first pregnancy 89      Social History   Socioeconomic History  Marital status: Married  Tobacco Use  Smoking status: Never  Passive exposure: Never  Smokeless tobacco: Never  Substance and Sexual Activity  Alcohol use: Never  Drug use: Never     Allergies  Allergen Reactions  Metformin Other (See Comments) and Muscle Pain   Current Outpatient Medications  Medication Sig Dispense Refill  alcohol swabs PadM Apply 1 each topically once daily And as needed Before using lancet to prick finger 200 each 1  cholecalciferol (VITAMIN D3) 2,000 unit capsule Take 1 capsule (2,000 Units total) by mouth once daily 360 capsule 11  ferrous sulfate 325 (65 FE) MG tablet Take 1 tablet (325 mg total) by mouth 2 (two) times daily with meals 180 tablet 3  lidocaine-prilocaine (EMLA) cream  norethindrone (MICRONOR) 0.35 mg tablet Take 1 tablet (0.35 mg total) by mouth once daily  ondansetron (ZOFRAN-ODT) 8 MG disintegrating tablet Take 8 mg by mouth every 8 (eight) hours as needed  ONETOUCH VERIO TEST STRIPS test strip 1 EACH (1 STRIP TOTAL) ONCE DAILY AND AS NEEDED 100 strip 12  prochlorperazine (COMPAZINE) 10 MG tablet Take 10 mg by mouth every 6 (six) hours as needed  semaglutide (OZEMPIC) 1 mg/dose (4 mg/3 mL) pen injector Inject 0.75 mLs (1 mg total) subcutaneously once a week 9 mL 1  blood glucose meter kit as directed 1 each 0  lancets Use 1 each once daily And as needed 150 each 12  norethindrone (MICRONOR) 0.35 mg tablet Take by mouth (Patient not taking: Reported on 10/27/2021)   No current facility-administered medications for  this visit.   Family History  Problem Relation Age of Onset  Migraines Mother  Ovarian cysts Mother  Hyperlipidemia (Elevated cholesterol) Father  Diabetes Maternal Grandfather  Diabetes Maternal Aunt  Ovarian cancer Cousin  maternal  Breast cancer Neg Hx  Colon cancer Neg Hx   Labs and Radiology:   November 09, 2021 skin biopsy:  Completed for clinical appearance of peau d'orange chan of the skin in the upper lateral breast.  DIAGNOSIS:  A. SKIN, RIGHT BREAST, UPPER OUTER QUADRANT; PUNCH BIOPSY:  - MINUTE CLUSTER OF ATYPICAL CELLS, SUSPICIOUS FOR DERMAL INVOLVEMENT BY  PREVIOUSLY DIAGNOSED INVASIVE  MAMMARY CARCINOMA.   Oct 19, 2021 right breast and axillary biopsy:   Triple positive tumor.  November 10, 2021 PET CT:  CHEST: Chest 3 cm right lateral breast mass is hypermetabolic with SUV max of 9.21 and consistent with known breast cancer. Adjacent fluid collection is likely postop/post biopsy liquified hematoma. A small biopsy clip is noted. Small scattered right axillary lymph nodes demonstrate mild hypermetabolism with SUV max of 5.08. These are suspicious for metastatic disease.  Breast MRI November 16, 2021:  IMPRESSION: 1. The patient's known malignancy in the upper outer quadrant of the right breast measures 3.8 x 2.8 x 3.6 cm. Numerous other suspicious masses are identified in the right breast as above involving the upper outer and upper inner quadrants, located both anterior and posterior to the known malignancy. The distance between the most anterior and posterior suspected satellite lesions at 7.5 cm. The distance between the most lateral and most medial suspected satellite lesions is 5.3 cm. The distance between the most superior and inferior satellite lesions is 4.3 cm. 2. Numerous enhancing foci in the skin as above are worrisome for skin metastases. 3. Biopsy proven metastatic node in the right axilla. An adjacent node is also abnormal in appearance as above.  January 07, 2022 right breast mammogram and ultrasound:  Targeted right breast ultrasound redemonstrates an irregular hypoechoic mass in the right breast 10 o'clock position 6 cm from the nipple. The mass measures up to 2.0 x 1.1 by 1.3 cm, previously measuring 2.9 x 1.9 x 2.0 cm in April 2023.  Incidentally visualized benign simple cyst in the right breast 9:30 position measuring up to 3.4 x 1.3 x 3.3 cm.  Targeted right axillary ultrasound demonstrates decreased right axillary lymphadenopathy, with abnormal cortical thickening up to 3 mm, previously 8 mm.  Right breast ultrasound March 11, 2022:  Examination of the axilla showed no clear adenopathy.  The right breast lesion at the 11 o'clock position, 6 cm from the nipple remains evident measuring 0.8 x 1.0 x 1.2 cm. This shows progressive decrease in size from the August 2023 exam.  Review of Systems  Constitutional: Negative for chills and fever.  Respiratory: Negative for cough.    Objective:  Physical Exam Exam conducted with a chaperone present.  Constitutional:  Appearance: Normal appearance.  Cardiovascular:  Rate and Rhythm: Normal rate and regular rhythm.  Pulses: Normal pulses.  Heart sounds: Normal heart sounds.  Pulmonary:  Effort: Pulmonary effort is normal.  Breath sounds: Normal breath sounds.  Chest:   Comments: Less defined mass in the upper outer quadrant of the right breast. Resolution of previously noted skin changes thought secondary to dermal lymphatic involvement. No clear axillary adenopathy. Musculoskeletal:  Cervical back: Neck supple.  Skin: General: Skin is warm and dry.  Neurological:  Mental Status: She is alert and oriented to person, place, and time.  Psychiatric:  Mood and Affect: Mood normal.  Behavior: Behavior normal.   Upper extremity measurements were obtained location 15 cm above as well as tendon 20 cm below the olecranon process.  March 11, 2022:  Right: 39.5, 31.5, 24.  Left: 42, 30.5, 23 cm.   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  Plan:   The patient is comfortable with the idea of mastectomy. In discussing reconstruction she is quite interested in this. The main issue would be the role of radiation therapy postmastectomy in a case of suspected inflammatory cancer. Informal discussion with radiation oncology suggested delay beyond 6 months of initiation of therapy to allow reconstruction would be considered prohibitive.  The patient could certainly have mastectomy  with expander placement and expansion within that 29-monthwindow.  The issue of management of the axilla in light of the improvement in the axillary nodes presents somewhat of a dilemma. The recent moved to avoid axillary dissection at all cost would suggest that she might be a candidate for sentinel node biopsy with wire localization of the clipped node and if 3 nodes can be examined intraoperatively and are negative would defer formal axillary dissection with plans for axillary radiation after surgery.  [facility]  The patient is aware that with my upcoming retirement the timeline for scheduling may suggest involvement of another surgeon to facilitate her care.  This was an extended visit as it included coordination with both medical oncology and radiation oncology as well as extensive review of prior imaging studies.  This note is partially prepared by MLedell Noss CMA acting as a scribe in the presence of Dr. JHervey Ard MD.   The documentation recorded by the scribe accurately reflects the service I personally performed and the decisions made by me.   JRobert Bellow MD FACS   Electronically signed by BMayer Masker MD at 03/14/2022 8:30 PM EDT

## 2022-03-30 ENCOUNTER — Telehealth: Payer: Self-pay | Admitting: Plastic Surgery

## 2022-03-30 ENCOUNTER — Other Ambulatory Visit: Payer: Self-pay | Admitting: General Surgery

## 2022-03-30 ENCOUNTER — Other Ambulatory Visit: Payer: Self-pay

## 2022-03-30 DIAGNOSIS — C50411 Malignant neoplasm of upper-outer quadrant of right female breast: Secondary | ICD-10-CM

## 2022-03-30 NOTE — Telephone Encounter (Signed)
Faxed OV notes, insurance information, and demographics to [facility] with confirmed receipt. Pt aware.

## 2022-03-30 NOTE — Telephone Encounter (Signed)
Pt states that Dr. Marla Roe was going to send over a prescription to [facility] but they have not received it.  Could someone return her call.

## 2022-04-01 ENCOUNTER — Other Ambulatory Visit: Payer: Self-pay

## 2022-04-01 ENCOUNTER — Other Ambulatory Visit: Payer: Self-pay | Admitting: Obstetrics and Gynecology

## 2022-04-01 DIAGNOSIS — D696 Thrombocytopenia, unspecified: Secondary | ICD-10-CM

## 2022-04-01 DIAGNOSIS — N920 Excessive and frequent menstruation with regular cycle: Secondary | ICD-10-CM

## 2022-04-02 ENCOUNTER — Encounter: Payer: Self-pay | Admitting: Medical Oncology

## 2022-04-02 ENCOUNTER — Inpatient Hospital Stay: Payer: 59

## 2022-04-02 ENCOUNTER — Inpatient Hospital Stay (HOSPITAL_BASED_OUTPATIENT_CLINIC_OR_DEPARTMENT_OTHER): Payer: 59 | Admitting: Medical Oncology

## 2022-04-02 VITALS — BP 104/69 | HR 81 | Temp 98.6°F | Wt 225.0 lb

## 2022-04-02 DIAGNOSIS — E876 Hypokalemia: Secondary | ICD-10-CM

## 2022-04-02 DIAGNOSIS — M7989 Other specified soft tissue disorders: Secondary | ICD-10-CM

## 2022-04-02 DIAGNOSIS — D500 Iron deficiency anemia secondary to blood loss (chronic): Secondary | ICD-10-CM | POA: Diagnosis not present

## 2022-04-02 DIAGNOSIS — L03019 Cellulitis of unspecified finger: Secondary | ICD-10-CM

## 2022-04-02 DIAGNOSIS — D696 Thrombocytopenia, unspecified: Secondary | ICD-10-CM

## 2022-04-02 DIAGNOSIS — C50411 Malignant neoplasm of upper-outer quadrant of right female breast: Secondary | ICD-10-CM | POA: Diagnosis not present

## 2022-04-02 LAB — COMPREHENSIVE METABOLIC PANEL
ALT: 17 U/L (ref 0–44)
AST: 27 U/L (ref 15–41)
Albumin: 2.9 g/dL — ABNORMAL LOW (ref 3.5–5.0)
Alkaline Phosphatase: 46 U/L (ref 38–126)
Anion gap: 8 (ref 5–15)
BUN: 10 mg/dL (ref 6–20)
CO2: 25 mmol/L (ref 22–32)
Calcium: 8.3 mg/dL — ABNORMAL LOW (ref 8.9–10.3)
Chloride: 107 mmol/L (ref 98–111)
Creatinine, Ser: 0.73 mg/dL (ref 0.44–1.00)
GFR, Estimated: >60 mL/min (ref >60)
Glucose, Bld: 192 mg/dL — ABNORMAL HIGH (ref 70–99)
Potassium: 3.4 mmol/L — ABNORMAL LOW (ref 3.5–5.1)
Sodium: 140 mmol/L (ref 135–145)
Total Bilirubin: 0.4 mg/dL (ref 0.3–1.2)
Total Protein: 5.9 g/dL — ABNORMAL LOW (ref 6.5–8.1)

## 2022-04-02 LAB — CBC WITH DIFFERENTIAL/PLATELET
Abs Immature Granulocytes: 0.03 10*3/uL (ref 0.00–0.07)
Basophils Absolute: 0.1 10*3/uL (ref 0.0–0.1)
Basophils Relative: 1 %
Eosinophils Absolute: 0.2 10*3/uL (ref 0.0–0.5)
Eosinophils Relative: 3 %
HCT: 30.5 % — ABNORMAL LOW (ref 36.0–46.0)
Hemoglobin: 9.6 g/dL — ABNORMAL LOW (ref 12.0–15.0)
Immature Granulocytes: 1 %
Lymphocytes Relative: 25 %
Lymphs Abs: 1.4 10*3/uL (ref 0.7–4.0)
MCH: 32.7 pg (ref 26.0–34.0)
MCHC: 31.5 g/dL (ref 30.0–36.0)
MCV: 103.7 fL — ABNORMAL HIGH (ref 80.0–100.0)
Monocytes Absolute: 0.5 10*3/uL (ref 0.1–1.0)
Monocytes Relative: 9 %
Neutro Abs: 3.5 10*3/uL (ref 1.7–7.7)
Neutrophils Relative %: 61 %
Platelets: 163 10*3/uL (ref 150–400)
RBC: 2.94 MIL/uL — ABNORMAL LOW (ref 3.87–5.11)
RDW: 15.4 % (ref 11.5–15.5)
WBC: 5.7 10*3/uL (ref 4.0–10.5)
nRBC: 0 % (ref 0.0–0.2)

## 2022-04-02 LAB — MAGNESIUM: Magnesium: 1.6 mg/dL — ABNORMAL LOW (ref 1.7–2.4)

## 2022-04-02 MED ORDER — MAGNESIUM SULFATE 4 GM/100ML IV SOLN: 4.0000 g | Freq: Once | INTRAVENOUS | Status: DC

## 2022-04-02 MED ORDER — MAGNESIUM SULFATE 2 GM/50ML IV SOLN
2.0000 g | Freq: Once | INTRAVENOUS | Status: AC
  Administered 2022-04-02: 2 g via INTRAVENOUS

## 2022-04-02 MED ORDER — SODIUM CHLORIDE 0.9 % IV SOLN
Freq: Once | INTRAVENOUS | Status: AC
  Filled 2022-04-02: qty 250

## 2022-04-02 MED ORDER — HEPARIN SOD (PORK) LOCK FLUSH 100 UNIT/ML IV SOLN
500.0000 [IU] | Freq: Once | INTRAVENOUS | Status: AC
  Administered 2022-04-02: 500 [IU] via INTRAVENOUS
  Filled 2022-04-02: qty 5

## 2022-04-02 MED ORDER — POTASSIUM CHLORIDE 20 MEQ/100ML IV SOLN
20.0000 meq | Freq: Once | INTRAVENOUS | Status: AC
  Administered 2022-04-02: 20 meq via INTRAVENOUS

## 2022-04-02 MED ORDER — MUPIROCIN 2 % EX OINT: 1.0000 | TOPICAL_OINTMENT | Freq: Two times a day (BID) | CUTANEOUS | 0 refills | Status: DC

## 2022-04-02 NOTE — Progress Notes (Signed)
Symptom Management [place] at [facility] [phone] [phone]  Patient Care Team: Latanya Maudlin, NP as PCP - General (Family Medicine) Daiva Huge, RN as Oncology Nurse Navigator Cammie Sickle, [name]   Name of the patient: Marilyn Page  [id number]  1976/03/19   Date of visit: 04/02/22  Reason for Consult: Marilyn Page is a 46 y.o. female with right breast TRIPLE POSITIVE with T4- Stage III currently s/p neoadjuvant TCH plus P chemotherapy #6 who presents today for:   Hypokalemia: At her last visit she was found to have hypokalemia. She was given IV supplementation and reports that she is feeling better. In addition she has increased her protein and calcium intake and her peripheral edema has improved. She continues to have some leaking and discomfort under her nails since chemo although it is improving. No fever or erythema. In terms of her treatment plan she has a unilateral mastectomy scheduled for 04/21/2022 by Dr. Terri Piedra.    Denies any neurologic complaints. Denies recent fevers or illnesses. Denies any easy bleeding or bruising. Reports good appetite and denies weight loss. Denies chest pain. Denies any nausea, vomiting, constipation, or diarrhea. Denies urinary complaints. Patient offers no further specific complaints today.  Wt Readings from Last 3 Encounters:  04/02/22 225 lb (102.1 kg)  03/24/22 229 lb 3.2 oz (104 kg)  03/22/22 220 lb (99.8 kg)     PAST MEDICAL HISTORY: Past Medical History:  Diagnosis Date   Carcinoma of upper-outer quadrant of right breast in female, estrogen receptor positive ([place]) 10/26/2021   Diabetes mellitus without complication ([place])    History of gestational diabetes    Personal history of chemotherapy     PAST SURGICAL HISTORY:  Past Surgical History:  Procedure Laterality Date   BREAST BIOPSY Right 10/19/2021   [place] Core bx 10:00 6 cmfn  Ribbon Clip-INVASIVE MAMMARY CARCINOMA,. Axilla Butterfly hydro clip-MACRO METASTATIC MAMMARY CARCINOMA   CESAREAN SECTION  2005/2008   DILATION AND CURETTAGE OF UTERUS  2003   PORTACATH PLACEMENT Left 11/09/2021   Procedure: INSERTION PORT-A-CATH;  Surgeon: Robert Bellow, MD;  Location: [facility] ORS;  Service: General;  Laterality: Left;   SKIN BIOPSY Right 11/09/2021   Procedure: PUNCH BIOPSY SKIN, TATTOO LYMPH NODE RIGHT AXILLA;  Surgeon: Robert Bellow, MD;  Location: [facility] ORS;  Service: General;  Laterality: Right;    HEMATOLOGY/ONCOLOGY HISTORY:  Oncology History Overview Note  MAY 2023-    Targeted ultrasound is performed, showing a 2.9 x 1.9 x 2.0 cm irregular hypoechoic mass right breast 10 o'clock position 6 cm from nipple at the site of palpable concern.   There is an abnormal 8 mm thickened right axillary lymph node.   There is skin thickening overlying the right breast. ------------------  A. BREAST, RIGHT AT 10:00, 6 CM FROM THE NIPPLE; ULTRASOUND-GUIDED CORE  NEEDLE BIOPSY:  - INVASIVE MAMMARY CARCINOMA, NO SPECIAL TYPE.   Size of invasive carcinoma: 11 mm in this sample  Histologic grade of invasive carcinoma: Grade 2                       Glandular/tubular differentiation score: 3                       Nuclear pleomorphism score: 2  Mitotic rate score: 1                       Total score: 6  Ductal carcinoma in situ: Present, intermediate grade  Lymphovascular invasion: Not identified   B. LYMPH NODE, RIGHT AXILLARY; ULTRASOUND-GUIDED CORE NEEDLE BIOPSY:  - 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 IN GREATEST  EXTENT.   Comment:  The malignancy in the primary breast lesion appears morphologically  different from tumor in the lymph node sampling, with tumor present in  lymph node more suggestive of a histologic grade 3, with significantly  increased mitotic activity and pleomorphism. Due to the discrepancy in  these  morphologic patterns, ER/PR/HER2 immunohistochemistry will be  performed on both blocks A1 and B1, with reflex to [place] for HER2 2+. The  results will be reported in an addendum.   ADDENDUM:  CASE SUMMARY: BREAST BIOMARKER TESTS - A - BREAST, RIGHT AT 10:00  Estrogen Receptor (ER) Status: POSITIVE          Percentage of cells with nuclear positivity: 71-80%          Average intensity of staining: Strong   Progesterone Receptor (PgR) Status: POSITIVE          Percentage of cells with nuclear positivity: 81-90%          Average intensity of staining: Strong   HER2 (by immunohistochemistry): POSITIVE (Score 3+)       Percentage of cells with uniform intense complete membrane  staining: 61-70%  HER2 displays a heterogeneous staining pattern, with areas showing a  strong 3+ staining pattern, and other regions with complete absence (0+)  of staining.   Ki-67: Not performed   CASE SUMMARY: BREAST BIOMARKER TESTS - B - RIGHT AXILLARY LYMPH NODE  Estrogen Receptor (ER) Status: POSITIVE          Percentage of cells with nuclear positivity: 81-90%          Average intensity of staining: Strong   Progesterone Receptor (PgR) Status: POSITIVE          Percentage of cells with nuclear positivity: 51-60%          Average intensity of staining: Strong   HER2 (by immunohistochemistry): NEGATIVE (Score 1+)  Ki-67: Not performed   #  MAY 2023- STAGE III-T4N1 ER/PR positive HER2 POSITIVE right breast cancer;LN-positive-ER/PR positive however 2 NEGATIVE s/p biopsy [see discussion below].  Discussed with Dr. Sed[place] Muta tumor heterogeneity. BREAST MRI- JUNE 2023- The patient's known malignancy in the upper outer quadrant of the right breast measures 3.8 x 2.8 x 3.6 cm. Numerous other suspicious masses are identified in the right breast as above involving the upper outer and upper inner quadrants, located both anterior and posterior to the known malignancy.  Numerous enhancing foci in the skin as above  are worrisome for skin metastases ; Bx proven. Biopsy proven metastatic node in the right axilla.  No evidence of malignancy in the left breast;  Two mildly prominent right internal mammary nodes were not FDG avid on recent PET-CT imaging.  Currently on neoadjuvant chemotherapy- TCH plus P x6 cycles;  MUGA scan May 31st, 2023- -61%.    # June 7th, 2023- NEO-ADJUVANT CHEMO- TCH+P   # Genetic counseling s/p- NEG for any deleterious mutations.     Carcinoma of upper-outer quadrant of right breast in female, estrogen receptor positive ([place])  10/26/2021 Initial Diagnosis   Carcinoma of upper-outer quadrant of right breast in female, estrogen  receptor positive ([place])   10/26/2021 Cancer Staging   Staging form: Breast, AJCC 8th Edition - Clinical: Stage IB (cT2, cN1, cM0, G2, ER+, PR+, HER2+) - Signed by Cammie Sickle, MD on 10/26/2021 Histologic grading system: 3 grade system   11/10/2021 -  Chemotherapy   Patient is on Treatment Plan : BREAST  Docetaxel + Carboplatin + Trastuzumab + Pertuzumab  (TCHP) q21d      11/11/2021 - 01/13/2022 Chemotherapy   Patient is on Treatment Plan : BREAST  Docetaxel + Carboplatin + Trastuzumab + Pertuzumab  (TCHP) q21d       Genetic Testing   Negative genetic testing. No pathogenic variants identified on the [facility] CancerNext-Expanded+RNA panel. The report date is 11/29/2021.  The CancerNext-Expanded + RNAinsight gene panel offered by [employer] and includes sequencing and rearrangement analysis for the following 77 genes: IP, ALK, APC*, ATM*, AXIN2, BAP1, BARD1, BLM, BMPR1A, BRCA1*, BRCA2*, BRIP1*, CDC73, CDH1*,CDK4, CDKN1B, CDKN2A, CHEK2*, CTNNA1, DICER1, FANCC, FH, FLCN, GALNT12, KIF1B, LZTR1, MAX, MEN1, MET, MLH1*, MSH2*, MSH3, MSH6*, MUTYH*, NBN, NF1*, NF2, NTHL1, PALB2*, PHOX2B, PMS2*, POT1, PRKAR1A, PTCH1, PTEN*, RAD51C*, RAD51D*,RB1, RECQL, RET, SDHA, SDHAF2, SDHB, SDHC, SDHD, SMAD4, SMARCA4, SMARCB1, SMARCE1, STK11, SUFU, TMEM127, TP53*,TSC1, TSC2, VHL and  XRCC2 (sequencing and deletion/duplication); EGFR, EGLN1, HOXB13, KIT, MITF, PDGFRA, POLD1 and POLE (sequencing only); EPCAM and GREM1 (deletion/duplication only).     ALLERGIES:  is allergic to metformin.  MEDICATIONS:  Current Outpatient Medications  Medication Sig Dispense Refill   acetaminophen (TYLENOL) 325 MG tablet Take 2 tablets (650 mg total) by mouth every 6 (six) hours as needed for mild pain (or Fever >/= 101).     ascorbic acid (VITAMIN C) 500 MG tablet Take 1 tablet (500 mg total) by mouth daily. 30 tablet    calcium-vitamin D (OSCAL WITH D) 500-5 MG-MCG tablet Take 1 tablet by mouth 2 (two) times daily. 60 tablet 2   Cyanocobalamin (VITAMIN B-12 PO) Take 1 tablet by mouth daily.     ferrous sulfate 325 (65 FE) MG tablet Take 325 mg by mouth 2 (two) times daily with a meal.     lidocaine-prilocaine (EMLA) cream Apply on the port. 30 -45 min  prior to port access. 30 g 3   loperamide (IMODIUM) 2 MG capsule Take 1 capsule (2 mg total) by mouth as needed for diarrhea or loose stools. 30 capsule 0   magic mouthwash w/lidocaine SOLN Take 5 mLs by mouth 4 (four) times daily as needed for mouth pain. Sig: Swish/Swallow 5-10 ml four times a day as needed. Dispense 480 ml. 1RF 480 mL 1   Magnesium Cl-Calcium Carbonate (SLOW MAGNESIUM/CALCIUM) 70-117 MG TBEC Take 1 tablet by mouth 2 (two) times daily. 60 tablet 6   Multiple Vitamin (MULTIVITAMIN WITH MINERALS) TABS tablet Take 1 tablet by mouth daily.     mupirocin ointment (BACTROBAN) 2 % Apply 1 Application topically 2 (two) times daily. 30 g 0   norethindrone (MICRONOR) 0.35 MG tablet Take 1 tablet (0.35 mg total) by mouth daily. 84 tablet 2   ondansetron (ZOFRAN-ODT) 8 MG disintegrating tablet Take 8 mg by mouth every 8 (eight) hours as needed for nausea or vomiting.     ONETOUCH VERIO test strip SMARTSIG:1 Each Via Meter Daily PRN     potassium chloride (KLOR-CON) 20 MEQ packet Take 20 mEq by mouth 2 (two) times daily. 60 packet 3    prochlorperazine (COMPAZINE) 10 MG tablet Take 1 tablet (10 mg total) by mouth every 6 (six) hours as  needed for nausea or vomiting. 40 tablet 1   Semaglutide, 1 MG/DOSE, 4 MG/3ML SOPN Inject into the skin. Inject 1 mg (0.75 ml) subcutaneously once a week.     alum & mag hydroxide-simeth (MAALOX/MYLANTA) 200-200-20 MG/5ML suspension Take 30 mLs by mouth every 4 (four) hours as needed for indigestion. (Patient not taking: Reported on 03/24/2022) 355 mL 0   No current facility-administered medications for this visit.   Facility-Administered Medications Ordered in Other Visits  Medication Dose Route Frequency Provider Last Rate Last Admin   magnesium sulfate IVPB 2 g 50 mL  2 g Intravenous Once Hughie Closs, PA-C 50 mL/hr at 04/02/22 1149 2 g at 04/02/22 1149   magnesium sulfate IVPB 4 g 100 mL  4 g Intravenous Once [place] Rosamond M, PA-C       potassium chloride 20 mEq in 100 mL IVPB  20 mEq Intravenous Once Hughie Closs, PA-C 100 mL/hr at 04/02/22 1147 20 mEq at 04/02/22 1147    VITAL SIGNS: BP 104/69 (BP Location: Right Arm, Patient Position: Sitting)   Pulse 81   Temp 98.6 F (37 C) (Tympanic)   Wt 225 lb (102.1 kg)   BMI 36.32 kg/m  Filed Weights   04/02/22 1043  Weight: 225 lb (102.1 kg)    Estimated body mass index is 36.32 kg/m as calculated from the following:   Height as of 03/24/22: _0  (1.676 m).   Weight as of this encounter: 225 lb (102.1 kg).  LABS: CBC:    Component Value Date/Time   WBC 5.7 04/02/2022 1035   HGB 9.6 (L) 04/02/2022 1035   HGB 7.8 (L) 04/23/2021 1431   HCT 30.5 (L) 04/02/2022 1035   HCT 25.5 (L) 04/23/2021 1431   PLT 163 04/02/2022 1035   PLT 384 04/23/2021 1431   MCV 103.7 (H) 04/02/2022 1035   MCV 84 04/23/2021 1431   NEUTROABS 3.5 04/02/2022 1035   NEUTROABS 5.4 04/23/2021 1431   LYMPHSABS 1.4 04/02/2022 1035   LYMPHSABS 2.2 04/23/2021 1431   MONOABS 0.5 04/02/2022 1035   EOSABS 0.2 04/02/2022 1035   EOSABS 0.2  04/23/2021 1431   BASOSABS 0.1 04/02/2022 1035   BASOSABS 0.1 04/23/2021 1431   Comprehensive Metabolic Panel:    Component Value Date/Time   NA 140 04/02/2022 1035   NA 143 09/26/2017 1512   K 3.4 (L) 04/02/2022 1035   CL 107 04/02/2022 1035   CO2 25 04/02/2022 1035   BUN 10 04/02/2022 1035   BUN 9 09/26/2017 1512   CREATININE 0.73 04/02/2022 1035   GLUCOSE 192 (H) 04/02/2022 1035   CALCIUM 8.3 (L) 04/02/2022 1035   AST 27 04/02/2022 1035   ALT 17 04/02/2022 1035   ALKPHOS 46 04/02/2022 1035   BILITOT 0.4 04/02/2022 1035   BILITOT <0.2 09/26/2017 1512   PROT 5.9 (L) 04/02/2022 1035   PROT 6.8 09/26/2017 1512   ALBUMIN 2.9 (L) 04/02/2022 1035   ALBUMIN 3.9 09/26/2017 1512    RADIOGRAPHIC STUDIES: CT CHEST ABDOMEN PELVIS W CONTRAST  Result Date: 03/15/2022 CLINICAL DATA:  Carcinoma of upper outer quadrant of right breast in female, estrogen receptor positive. Breast cancer, invasive, initial workup. EXAM: CT CHEST, ABDOMEN, AND PELVIS WITH CONTRAST TECHNIQUE: Multidetector CT imaging of the chest, abdomen and pelvis was performed following the standard protocol during bolus administration of intravenous contrast. RADIATION DOSE REDUCTION: This exam was performed according to the departmental dose-optimization program which includes automated exposure control, adjustment of the mA and/or kV according  to patient size and/or use of iterative reconstruction technique. CONTRAST:  18m OMNIPAQUE IOHEXOL 300 MG/ML  SOLN COMPARISON:  Chest CTA 12/28/2021, abdominal CT 12/27/2021. PET CT 11/10/2021 FINDINGS: CT CHEST FINDINGS Cardiovascular: Left chest port with tip in the atrial caval junction. The heart is normal in size. No pericardial effusion or thickening. Normal caliber thoracic aorta. Mediastinum/Nodes: Stable small mediastinal lymph nodes, none of which are enlarged by size criteria. There is no hilar adenopathy. The previous low right axillary node that was hypermetabolic has  diminished in size, only punctate. No enlarged axillary lymph nodes. No supraclavicular or internal mammary adenopathy. The esophagus is decompressed. Lungs/Pleura: No pulmonary nodule. No focal airspace disease. There is a trace left pleural effusion that was new from prior exam. No enhancement or nodularity. The trachea and central bronchi are patent. Musculoskeletal: No focal bone lesion or acute osseous findings. Known right breast mass is obscured by adjacent breast tissue. There is right breast skin thickening. CT ABDOMEN PELVIS FINDINGS Hepatobiliary: No cystic or solid liver lesion. Mild decreased hepatic density typical of steatosis. Gallbladder physiologically distended, no calcified stone. No biliary dilatation. Pancreas: No pancreatic mass.  No ductal dilatation or inflammation. Spleen: Upper normal in size but normal in volume. No focal splenic abnormality. Adrenals/Urinary Tract: No discrete adrenal nodule. Low-density thickening of the left adrenal gland is stable from prior exams and not hypermetabolic on prior PET. No hydronephrosis, renal calculi or focal renal abnormality. Decompressed urinary bladder, not well assessed due to nondistention but noninflamed. Stomach/Bowel: Physiologically distended stomach. No bowel obstruction or inflammatory change. The appendix is normal. Small-moderate colonic stool burden. Vascular/Lymphatic: Nonspecific 11 mm left inguinal lymph node, typically reactive. Small mesenteric and retroperitoneal nodes are stable from prior exam and not enlarged by size criteria. Normal caliber abdominal aorta. No acute vascular findings. Reproductive: Enlarged uterus at is heterogeneous, the endometrial complex is poorly defined. No adnexal mass. Other: No ascites or free fluid. No omental thickening. Tiny fat containing umbilical hernia. Musculoskeletal: No focal bone lesions or acute osseous findings. Mild L4-L5 and L5-S1 degenerative disc disease. IMPRESSION: 1. Known right  breast mass is obscured by adjacent breast tissue. Right breast skin thickening. 2. The previous hypermetabolic low right axillary node has diminished in size, now only punctate. 3. No evidence of metastatic disease in the chest, abdomen, or pelvis. 4. Enlarged heterogeneous uterus with poorly defined endometrial complex. Previous pelvic ultrasounds were suspicious for adenomyosis. 5. Hepatic steatosis. Electronically Signed   By: MKeith RakeM.D.   On: 03/15/2022 11:46   DG Chest 2 View  Result Date: 03/05/2022 CLINICAL DATA:  Fever, breast cancer EXAM: CHEST - 2 VIEW COMPARISON:  12/27/2021 FINDINGS: Frontal and lateral views of the chest demonstrates stable left chest wall port. Cardiac silhouette is unremarkable. No acute airspace disease, effusion, or pneumothorax. No acute bony abnormalities. IMPRESSION: 1. No acute intrathoracic process. Electronically Signed   By: MRanda NgoM.D.   On: 03/05/2022 21:30    PERFORMANCE STATUS (ECOG) : 1 - Symptomatic but completely ambulatory  Review of Systems Unless otherwise noted, a complete review of systems is negative.  Physical Exam General: NAD Cardiovascular: regular rate and rhythm Pulmonary: clear ant fields Abdomen: soft, nontender, + bowel sounds GU: no suprapubic tenderness Extremities: Scant non-pitting edema of bilateral feet not extending into legs, no joint deformities MSK: Negative Homans sign Skin: no rashes, no erythema Neurological: Weakness but otherwise nonfocal  Assessment and Plan- Patient is a 46y.o. female    Encounter Diagnoses  Name Primary?   Foot swelling Yes   Hypomagnesemia    Hypokalemia    Iron deficiency anemia due to chronic blood loss    Thrombocytopenia (HCC)    Infection of nail bed of finger, unspecified laterality      Foot swelling: Improved with increased protein in diet. BNP normal. Will monitor.  Hypomagnesemia- Chronic but improved. 2 g supplementation today. Continue home  supplementation. RTC on 04/25/2022 for recheck- sooner if symptomatic Hypokalemia-Chronic but improved. 20 MEQ supplementation today. Continue home supplementation. RTC on 04/15/2022 or sooner if symptomatic.  IDA- Improving. She will also start B12 and folate supplement given macrocytosis.  Thrombocytopenia- Secondary to chemotherapy. Improving Nail infection- Improves with ABX however at this time I suspect she will do best with topical abx under the nail. Discussed how to use. She will alert me if symptoms worsen.   Disposition: Today- 20 MEQ K, 2 Mag Keep follow up with lauren on 04/15/2022 as previously scheduled  Patient expressed understanding and was in agreement with this plan. She also understands that She can call clinic at any time with any questions, concerns, or complaints.   Thank you for allowing me to participate in the care of this very pleasant patient.   Time Total: 25  Visit consisted of counseling and education dealing with the complex and emotionally intense issues of symptom management in the setting of serious illness.Greater than 50%  of this time was spent counseling and coordinating care related to the above assessment and plan.  Signed by: Nelwyn Salisbury, PA-C

## 2022-04-08 ENCOUNTER — Telehealth: Payer: Self-pay

## 2022-04-08 NOTE — Telephone Encounter (Signed)
I called and spoke with Stanton Kidney regarding what garments the patient will need in the postoperative period

## 2022-04-08 NOTE — Telephone Encounter (Signed)
Stanton Kidney is calling from [facility] Mastectomy office, patient is having a dual surgery with [place]. Stanton Kidney wants to know if we want our patient needs comfortable drain management or compression for her mastectomy.

## 2022-04-12 ENCOUNTER — Ambulatory Visit (INDEPENDENT_AMBULATORY_CARE_PROVIDER_SITE_OTHER): Payer: 59 | Admitting: Student

## 2022-04-12 ENCOUNTER — Encounter: Payer: 59 | Admitting: Student

## 2022-04-12 VITALS — BP 111/60 | HR 87 | Ht 66.0 in | Wt 221.0 lb

## 2022-04-12 DIAGNOSIS — C50411 Malignant neoplasm of upper-outer quadrant of right female breast: Secondary | ICD-10-CM

## 2022-04-12 DIAGNOSIS — Z170 Estrogen receptor positive status [ER+]: Secondary | ICD-10-CM

## 2022-04-12 MED ORDER — OXYCODONE HCL 5 MG PO TABS: 5.0000 mg | ORAL_TABLET | Freq: Three times a day (TID) | ORAL | 0 refills | Status: DC | PRN

## 2022-04-12 MED ORDER — CEPHALEXIN 500 MG PO CAPS: 500.0000 mg | ORAL_CAPSULE | Freq: Four times a day (QID) | ORAL | 0 refills | Status: AC

## 2022-04-12 MED ORDER — ONDANSETRON HCL 4 MG PO TABS: 4.0000 mg | ORAL_TABLET | Freq: Three times a day (TID) | ORAL | 0 refills | Status: AC | PRN

## 2022-04-12 MED ORDER — DIAZEPAM 2 MG PO TABS: 2.0000 mg | ORAL_TABLET | Freq: Two times a day (BID) | ORAL | 0 refills | Status: DC | PRN

## 2022-04-12 NOTE — Progress Notes (Signed)
Patient ID: TYJAI CHARBONNET, female    DOB: 04-20-1976, 46 y.o.   MRN: [id number]  Chief Complaint  Patient presents with   Pre-op Exam      ICD-10-CM   1. Carcinoma of upper-outer quadrant of right breast in female, estrogen receptor positive ([place])  C50.411    Z17.0        History of Present Illness: CLEATUS GOODIN is a 46 y.o.  female  with a history of right breast cancer.  She presents for preoperative evaluation for upcoming procedure, right breast reconstruction with placement of tissue expander and Flex HD, scheduled for 04/21/2022 with Dr.  Marla Roe.  Patient will also be undergoing right simple mastectomy with axillary sentinel node biopsy with Dr. Bary Castilla.   The patient denies any issues with anesthesia in the past.  She denies any history of cardiac disease.  She denies any use of blood thinners.  Patient states she is not a smoker.  Patient denies any use of birth control or hormone replacement.  She reports history of 1 miscarriage.  She denies any personal or family history of blood clots or clotting diseases.  Patient does report she was hospitalized at the end of September for low platelets, hemoglobin, magnesium and potassium after chemotherapy.  She states that her platelets and hemoglobin have improved and have been stable.  She states she has been regularly following up with a provider regarding this.  She states that her magnesium and potassium have slightly improved, but she is still taking supplements for these.  Patient denies any recent surgeries, traumas or infections.  She denies any history of strokes or heart attacks.  She does report that she has a port a cath in place.  She denies any recent fevers chills or shortness of breath.  She denies any recent changes in her health.  Patient reports she is currently a 9 C and she would like to match her left breast after reconstruction and be approximately the same size.  Summary of Previous Visit: Patient was seen  in the clinic on 03/22/2022 for consult by Dr. Marla Roe.  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  Patient reported her current bra size is likely a C cup, would like to be around the same size after reconstruction.  Patient did report she has diabetes and was recently on Ozempic but has since stopped.  Patient does report she has a Port-A-Cath in place and stated that radiation has been a discussion and is a strong possibility.  Patient at this visit wanted to move ahead with a right-sided mastectomy with immediate reconstruction using expander and Flex HD.  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  Her hemoglobin was 9.6 and her platelets were 163.  Per the chart, it appears that she has a CBC done fairly regularly and her hemoglobin stays in the 8.5-9.5 range.  Patient reports that she is getting more labs drawn this upcoming Thursday.  It appears from the patient's chart that her last hemoglobin A1c was on 12/27/2021 which was 6.0.  Patient reports that she has not taken her Ozempic in a while.  She states that she has been checking her  sugars regularly and they have been controlled.  Chemotherapy/radiation: Patient reports that she finished chemotherapy 1 month ago.  She states that radiation has been mentioned as a possibility, but there is no plan in place yet for radiation.   Past Medical History: Allergies: Allergies  Allergen Reactions   Metformin Other (See Comments)    Current Medications:  Current Outpatient Medications:    acetaminophen (TYLENOL) 325 MG tablet, Take 2 tablets (650 mg total) by mouth every 6 (six) hours as needed for mild pain (or Fever >/=  101)., Disp: , Rfl:    alum & mag hydroxide-simeth (MAALOX/MYLANTA) 200-200-20 MG/5ML suspension, Take 30 mLs by mouth every 4 (four) hours as needed for indigestion., Disp: 355 mL, Rfl: 0   ascorbic acid (VITAMIN C) 500 MG tablet, Take 1 tablet (500 mg total) by mouth daily., Disp: 30 tablet, Rfl:    calcium-vitamin D (OSCAL WITH D) 500-5 MG-MCG tablet, Take 1 tablet by mouth 2 (two) times daily., Disp: 60 tablet, Rfl: 2   cephALEXin (KEFLEX) 500 MG capsule, Take 1 capsule (500 mg total) by mouth 4 (four) times daily for 3 days., Disp: 12 capsule, Rfl: 0   Cyanocobalamin (VITAMIN B-12 PO), Take 1 tablet by mouth daily., Disp: , Rfl:    diazepam (VALIUM) 2 MG tablet, Take 1 tablet (2 mg total) by mouth every 12 (twelve) hours as needed for up to 20 doses for muscle spasms., Disp: 20 tablet, Rfl: 0   ferrous sulfate 325 (65 FE) MG tablet, Take 325 mg by mouth 2 (two) times daily with a meal., Disp: , Rfl:    lidocaine-prilocaine (EMLA) cream, Apply on the port. 30 -45 min  prior to port access., Disp: 30 g, Rfl: 3   loperamide (IMODIUM) 2 MG capsule, Take 1 capsule (2 mg total) by mouth as needed for diarrhea or loose stools., Disp: 30 capsule, Rfl: 0   magic mouthwash w/lidocaine SOLN, Take 5 mLs by mouth 4 (four) times daily as needed for mouth pain. Sig: Swish/Swallow 5-10 ml four times a day as needed. Dispense 480 ml. 1RF, Disp: 480 mL, Rfl: 1   Magnesium Cl-Calcium Carbonate (SLOW MAGNESIUM/CALCIUM) 70-117 MG TBEC, Take 1 tablet by mouth 2 (two) times daily., Disp: 60 tablet, Rfl: 6   Multiple Vitamin (MULTIVITAMIN WITH MINERALS) TABS tablet, Take 1 tablet by mouth daily., Disp: , Rfl:    mupirocin ointment (BACTROBAN) 2 %, Apply 1 Application topically 2 (two) times daily., Disp: 30 g, Rfl: 0   norethindrone (MICRONOR) 0.35 MG tablet, Take 1 tablet (0.35 mg total) by mouth daily., Disp: 84 tablet, Rfl: 2   ondansetron (ZOFRAN) 4 MG tablet, Take 1 tablet (4 mg total) by mouth every 8 (eight)  hours as needed for up to 20 doses for nausea or vomiting., Disp: 20 tablet, Rfl: 0   ondansetron (ZOFRAN-ODT) 8 MG disintegrating tablet, Take 8 mg by mouth every 8 (eight) hours as needed for nausea or vomiting., Disp: , Rfl:    ONETOUCH VERIO test strip, SMARTSIG:1 Each Via Meter Daily PRN, Disp: , Rfl:    oxyCODONE (ROXICODONE) 5 MG immediate release tablet, Take 1 tablet (5 mg total) by mouth every 8 (eight) hours as needed for up to 20 doses for severe pain., Disp: 20 tablet, Rfl: 0   potassium chloride (KLOR-CON) 20 MEQ packet, Take 20 mEq by mouth 2 (two) times daily., Disp: 60 packet, Rfl: 3   prochlorperazine (COMPAZINE) 10 MG tablet, Take 1 tablet (10 mg total) by mouth every  6 (six) hours as needed for nausea or vomiting., Disp: 40 tablet, Rfl: 1   Semaglutide, 1 MG/DOSE, 4 MG/3ML SOPN, Inject into the skin. Inject 1 mg (0.75 ml) subcutaneously once a week., Disp: , Rfl:   Past Medical Problems: Past Medical History:  Diagnosis Date   Carcinoma of upper-outer quadrant of right breast in female, estrogen receptor positive ([place]) 10/26/2021   Diabetes mellitus without complication ([place])    History of gestational diabetes    Personal history of chemotherapy     Past Surgical History: Past Surgical History:  Procedure Laterality Date   BREAST BIOPSY Right 10/19/2021   [place] Core bx 10:00 6 cmfn Ribbon Clip-INVASIVE MAMMARY CARCINOMA,. Axilla Butterfly hydro clip-MACRO METASTATIC MAMMARY CARCINOMA   CESAREAN SECTION  2005/2008   DILATION AND CURETTAGE OF UTERUS  2003   PORTACATH PLACEMENT Left 11/09/2021   Procedure: INSERTION PORT-A-CATH;  Surgeon: Robert Bellow, MD;  Location: [facility] ORS;  Service: General;  Laterality: Left;   SKIN BIOPSY Right 11/09/2021   Procedure: PUNCH BIOPSY SKIN, TATTOO LYMPH NODE RIGHT AXILLA;  Surgeon: Robert Bellow, MD;  Location: [facility] ORS;  Service: General;  Laterality: Right;    Social History: Social History   Socioeconomic History    Marital status: Married    Spouse name: Jaci Standard   Number of children: 2   Years of education: Not on file   Highest education level: Not on file  Occupational History   Occupation: and Optometrist  Tobacco Use   Smoking status: Never   Smokeless tobacco: Never  Vaping Use   Vaping Use: Never used  Substance and Sexual Activity   Alcohol use: Never   Drug use: Never   Sexual activity: Yes    Birth control/protection: Other-see comments, Surgical    Comment: vasectomy  Other Topics Concern   Not on file  Social History Narrative   Pre-school teacher; in [place]; 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    Social Determinants of Health   Financial Resource Strain: Not on file  Food Insecurity: Not on file  Transportation Needs: Not on file  Physical Activity: Inactive (09/26/2017)   Exercise Vital Sign    Days of Exercise per Week: 0 days    Minutes of Exercise per Session: 0 min  Stress: Not on file  Social Connections: Not on file  Intimate Partner Violence: Not on file    Family History: Family History  Problem Relation Age of Onset   Ovarian cysts Mother    Migraines Mother    Melanoma Mother        on leg   Hyperlipidemia Father    Diabetes Maternal Aunt    Stomach cancer Paternal Aunt    Diabetes Maternal Grandfather    Lymphoma Cousin 17   Breast cancer Neg Hx     Review of Systems: Denies any recent fevers, chills, shortness of breath.  Physical Exam: Vital Signs BP 111/60 (BP Location: Left Arm, Patient Position: Sitting, Cuff Size: Large)   Pulse 87   Ht '5\' 6"'$  (1.676 m)   Wt 221 lb (100.2 kg)   SpO2 99%   BMI 35.67 kg/m   Physical Exam  Constitutional:      General: Not in acute distress.    Appearance: Normal appearance. Not ill-appearing.  HENT:     Head: Normocephalic and atraumatic.  Neck:     Musculoskeletal: Normal range of motion.  Cardiovascular:     Rate and Rhythm: Normal rate Pulmonary:     Effort:  Pulmonary effort is normal. No  respiratory distress.  Musculoskeletal: Normal range of motion.  Lower Extremities: No swelling noted, no varicose veins noted Skin:    General: Skin is warm and dry.     Findings: No erythema or rash.  Neurological:     Mental Status: Alert and oriented to person, place, and time. Mental status is at baseline.  Psychiatric:        Mood and Affect: Mood normal.        Behavior: Behavior normal.    Assessment/Plan: The patient is scheduled for right breast reconstruction with placement of tissue expander and Flex HD with Dr. Marla Roe.  Risks, benefits, and alternatives of procedure discussed, questions answered and consent obtained.    Smoking Status: Non-smoker; Counseling Given?  N/A  Caprini Score: 8; Risk Factors include: Age, history of malignancy, current Port-A-Cath in place, BMI greater than 25, and length of planned surgery. Recommendation for mechanical possible pharmacological prophylaxis. Encourage early ambulation.  I will discuss the possibility of postoperative pharmacological prophylaxis with Dr. Marla Roe.  Pictures obtained: '@consult'$   Post-op Rx sent to pharmacy: Oxycodone, Zofran, Keflex, Valium  I discussed with the patient that she should hold multivitamins and ibuprofen 1 week prior to surgery.  Patient expressed understanding.  I discussed with the patient that she should not take her Ozempic dose at least 1 week prior to surgery.  Patient expressed understanding.  Will send clearance to patient's oncologist provider who has been managing her thrombocytopenia and her anemia.  Patient was provided with the breast reconstruction and General Surgical Risk consent document and Pain Medication Agreement prior to their appointment.  They had adequate time to read through the risk consent documents and Pain Medication Agreement. We also discussed them in person together during this preop appointment. All of their questions were answered to their satisfaction.   Recommended calling if they have any further questions.  Risk consent form and Pain Medication Agreement to be scanned into patient's chart.  The risks that can be encountered with and after placement of a breast expander placement were discussed and include the following but not limited to these: bleeding, infection, delayed healing, anesthesia risks, skin sensation changes, injury to structures including nerves, blood vessels, and muscles which may be temporary or permanent, allergies to tape, suture materials and glues, blood products, topical preparations or injected agents, skin contour irregularities, skin discoloration and swelling, deep vein thrombosis, cardiac and pulmonary complications, pain, which may persist, fluid accumulation, wrinkling of the skin over the expander, changes in nipple or breast sensation, expander leakage or rupture, faulty position of the expander, persistent pain, formation of tight scar tissue around the expander (capsular contracture), possible need for revisional surgery or staged procedures.   Electronically signed by: Clance Boll, PA-C 04/12/2022 4:55 PM

## 2022-04-12 NOTE — H&P (View-Only) (Signed)
Patient ID: Marilyn Page, female    DOB: Jul 21, 1975, 46 y.o.   MRN: [id number]  Chief Complaint  Patient presents with   Pre-op Exam      ICD-10-CM   1. Carcinoma of upper-outer quadrant of right breast in female, estrogen receptor positive ([place])  C50.411    Z17.0        History of Present Illness: Marilyn Page is a 46 y.o.  female  with a history of right breast cancer.  She presents for preoperative evaluation for upcoming procedure, right breast reconstruction with placement of tissue expander and Flex HD, scheduled for 04/21/2022 with Dr.  Marla Roe.  Patient will also be undergoing right simple mastectomy with axillary sentinel node biopsy with Dr. Bary Castilla.   The patient denies any issues with anesthesia in the past.  She denies any history of cardiac disease.  She denies any use of blood thinners.  Patient states she is not a smoker.  Patient denies any use of birth control or hormone replacement.  She reports history of 1 miscarriage.  She denies any personal or family history of blood clots or clotting diseases.  Patient does report she was hospitalized at the end of September for low platelets, hemoglobin, magnesium and potassium after chemotherapy.  She states that her platelets and hemoglobin have improved and have been stable.  She states she has been regularly following up with a provider regarding this.  She states that her magnesium and potassium have slightly improved, but she is still taking supplements for these.  Patient denies any recent surgeries, traumas or infections.  She denies any history of strokes or heart attacks.  She does report that she has a port a cath in place.  She denies any recent fevers chills or shortness of breath.  She denies any recent changes in her health.  Patient reports she is currently a 73 C and she would like to match her left breast after reconstruction and be approximately the same size.  Summary of Previous Visit: Patient was seen  in the clinic on 03/22/2022 for consult by Dr. Marla Roe.  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  Patient reported her current bra size is likely a C cup, would like to be around the same size after reconstruction.  Patient did report she has diabetes and was recently on Ozempic but has since stopped.  Patient does report she has a Port-A-Cath in place and stated that radiation has been a discussion and is a strong possibility.  Patient at this visit wanted to move ahead with a right-sided mastectomy with immediate reconstruction using expander and Flex HD.  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  Her hemoglobin was 9.6 and her platelets were 163.  Per the chart, it appears that she has a CBC done fairly regularly and her hemoglobin stays in the 8.5-9.5 range.  Patient reports that she is getting more labs drawn this upcoming Thursday.  It appears from the patient's chart that her last hemoglobin A1c was on 12/27/2021 which was 6.0.  Patient reports that she has not taken her Ozempic in a while.  She states that she has been checking her  sugars regularly and they have been controlled.  Chemotherapy/radiation: Patient reports that she finished chemotherapy 1 month ago.  She states that radiation has been mentioned as a possibility, but there is no plan in place yet for radiation.   Past Medical History: Allergies: Allergies  Allergen Reactions   Metformin Other (See Comments)    Current Medications:  Current Outpatient Medications:    acetaminophen (TYLENOL) 325 MG tablet, Take 2 tablets (650 mg total) by mouth every 6 (six) hours as needed for mild pain (or Fever >/=  101)., Disp: , Rfl:    alum & mag hydroxide-simeth (MAALOX/MYLANTA) 200-200-20 MG/5ML suspension, Take 30 mLs by mouth every 4 (four) hours as needed for indigestion., Disp: 355 mL, Rfl: 0   ascorbic acid (VITAMIN C) 500 MG tablet, Take 1 tablet (500 mg total) by mouth daily., Disp: 30 tablet, Rfl:    calcium-vitamin D (OSCAL WITH D) 500-5 MG-MCG tablet, Take 1 tablet by mouth 2 (two) times daily., Disp: 60 tablet, Rfl: 2   cephALEXin (KEFLEX) 500 MG capsule, Take 1 capsule (500 mg total) by mouth 4 (four) times daily for 3 days., Disp: 12 capsule, Rfl: 0   Cyanocobalamin (VITAMIN B-12 PO), Take 1 tablet by mouth daily., Disp: , Rfl:    diazepam (VALIUM) 2 MG tablet, Take 1 tablet (2 mg total) by mouth every 12 (twelve) hours as needed for up to 20 doses for muscle spasms., Disp: 20 tablet, Rfl: 0   ferrous sulfate 325 (65 FE) MG tablet, Take 325 mg by mouth 2 (two) times daily with a meal., Disp: , Rfl:    lidocaine-prilocaine (EMLA) cream, Apply on the port. 30 -45 min  prior to port access., Disp: 30 g, Rfl: 3   loperamide (IMODIUM) 2 MG capsule, Take 1 capsule (2 mg total) by mouth as needed for diarrhea or loose stools., Disp: 30 capsule, Rfl: 0   magic mouthwash w/lidocaine SOLN, Take 5 mLs by mouth 4 (four) times daily as needed for mouth pain. Sig: Swish/Swallow 5-10 ml four times a day as needed. Dispense 480 ml. 1RF, Disp: 480 mL, Rfl: 1   Magnesium Cl-Calcium Carbonate (SLOW MAGNESIUM/CALCIUM) 70-117 MG TBEC, Take 1 tablet by mouth 2 (two) times daily., Disp: 60 tablet, Rfl: 6   Multiple Vitamin (MULTIVITAMIN WITH MINERALS) TABS tablet, Take 1 tablet by mouth daily., Disp: , Rfl:    mupirocin ointment (BACTROBAN) 2 %, Apply 1 Application topically 2 (two) times daily., Disp: 30 g, Rfl: 0   norethindrone (MICRONOR) 0.35 MG tablet, Take 1 tablet (0.35 mg total) by mouth daily., Disp: 84 tablet, Rfl: 2   ondansetron (ZOFRAN) 4 MG tablet, Take 1 tablet (4 mg total) by mouth every 8 (eight)  hours as needed for up to 20 doses for nausea or vomiting., Disp: 20 tablet, Rfl: 0   ondansetron (ZOFRAN-ODT) 8 MG disintegrating tablet, Take 8 mg by mouth every 8 (eight) hours as needed for nausea or vomiting., Disp: , Rfl:    ONETOUCH VERIO test strip, SMARTSIG:1 Each Via Meter Daily PRN, Disp: , Rfl:    oxyCODONE (ROXICODONE) 5 MG immediate release tablet, Take 1 tablet (5 mg total) by mouth every 8 (eight) hours as needed for up to 20 doses for severe pain., Disp: 20 tablet, Rfl: 0   potassium chloride (KLOR-CON) 20 MEQ packet, Take 20 mEq by mouth 2 (two) times daily., Disp: 60 packet, Rfl: 3   prochlorperazine (COMPAZINE) 10 MG tablet, Take 1 tablet (10 mg total) by mouth every  6 (six) hours as needed for nausea or vomiting., Disp: 40 tablet, Rfl: 1   Semaglutide, 1 MG/DOSE, 4 MG/3ML SOPN, Inject into the skin. Inject 1 mg (0.75 ml) subcutaneously once a week., Disp: , Rfl:   Past Medical Problems: Past Medical History:  Diagnosis Date   Carcinoma of upper-outer quadrant of right breast in female, estrogen receptor positive ([place]) 10/26/2021   Diabetes mellitus without complication ([place])    History of gestational diabetes    Personal history of chemotherapy     Past Surgical History: Past Surgical History:  Procedure Laterality Date   BREAST BIOPSY Right 10/19/2021   [place] Core bx 10:00 6 cmfn Ribbon Clip-INVASIVE MAMMARY CARCINOMA,. Axilla Butterfly hydro clip-MACRO METASTATIC MAMMARY CARCINOMA   CESAREAN SECTION  2005/2008   DILATION AND CURETTAGE OF UTERUS  2003   PORTACATH PLACEMENT Left 11/09/2021   Procedure: INSERTION PORT-A-CATH;  Surgeon: Robert Bellow, MD;  Location: [facility] ORS;  Service: General;  Laterality: Left;   SKIN BIOPSY Right 11/09/2021   Procedure: PUNCH BIOPSY SKIN, TATTOO LYMPH NODE RIGHT AXILLA;  Surgeon: Robert Bellow, MD;  Location: [facility] ORS;  Service: General;  Laterality: Right;    Social History: Social History   Socioeconomic History    Marital status: Married    Spouse name: Jaci Standard   Number of children: 2   Years of education: Not on file   Highest education level: Not on file  Occupational History   Occupation: and Optometrist  Tobacco Use   Smoking status: Never   Smokeless tobacco: Never  Vaping Use   Vaping Use: Never used  Substance and Sexual Activity   Alcohol use: Never   Drug use: Never   Sexual activity: Yes    Birth control/protection: Other-see comments, Surgical    Comment: vasectomy  Other Topics Concern   Not on file  Social History Narrative   Pre-school teacher; in [place]; 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    Social Determinants of Health   Financial Resource Strain: Not on file  Food Insecurity: Not on file  Transportation Needs: Not on file  Physical Activity: Inactive (09/26/2017)   Exercise Vital Sign    Days of Exercise per Week: 0 days    Minutes of Exercise per Session: 0 min  Stress: Not on file  Social Connections: Not on file  Intimate Partner Violence: Not on file    Family History: Family History  Problem Relation Age of Onset   Ovarian cysts Mother    Migraines Mother    Melanoma Mother        on leg   Hyperlipidemia Father    Diabetes Maternal Aunt    Stomach cancer Paternal Aunt    Diabetes Maternal Grandfather    Lymphoma Cousin 17   Breast cancer Neg Hx     Review of Systems: Denies any recent fevers, chills, shortness of breath.  Physical Exam: Vital Signs BP 111/60 (BP Location: Left Arm, Patient Position: Sitting, Cuff Size: Large)   Pulse 87   Ht '5\' 6"'$  (1.676 m)   Wt 221 lb (100.2 kg)   SpO2 99%   BMI 35.67 kg/m   Physical Exam  Constitutional:      General: Not in acute distress.    Appearance: Normal appearance. Not ill-appearing.  HENT:     Head: Normocephalic and atraumatic.  Neck:     Musculoskeletal: Normal range of motion.  Cardiovascular:     Rate and Rhythm: Normal rate Pulmonary:     Effort:  Pulmonary effort is normal. No  respiratory distress.  Musculoskeletal: Normal range of motion.  Lower Extremities: No swelling noted, no varicose veins noted Skin:    General: Skin is warm and dry.     Findings: No erythema or rash.  Neurological:     Mental Status: Alert and oriented to person, place, and time. Mental status is at baseline.  Psychiatric:        Mood and Affect: Mood normal.        Behavior: Behavior normal.    Assessment/Plan: The patient is scheduled for right breast reconstruction with placement of tissue expander and Flex HD with Dr. Marla Roe.  Risks, benefits, and alternatives of procedure discussed, questions answered and consent obtained.    Smoking Status: Non-smoker; Counseling Given?  N/A  Caprini Score: 8; Risk Factors include: Age, history of malignancy, current Port-A-Cath in place, BMI greater than 25, and length of planned surgery. Recommendation for mechanical possible pharmacological prophylaxis. Encourage early ambulation.  I will discuss the possibility of postoperative pharmacological prophylaxis with Dr. Marla Roe.  Pictures obtained: '@consult'$   Post-op Rx sent to pharmacy: Oxycodone, Zofran, Keflex, Valium  I discussed with the patient that she should hold multivitamins and ibuprofen 1 week prior to surgery.  Patient expressed understanding.  I discussed with the patient that she should not take her Ozempic dose at least 1 week prior to surgery.  Patient expressed understanding.  Will send clearance to patient's oncologist provider who has been managing her thrombocytopenia and her anemia.  Patient was provided with the breast reconstruction and General Surgical Risk consent document and Pain Medication Agreement prior to their appointment.  They had adequate time to read through the risk consent documents and Pain Medication Agreement. We also discussed them in person together during this preop appointment. All of their questions were answered to their satisfaction.   Recommended calling if they have any further questions.  Risk consent form and Pain Medication Agreement to be scanned into patient's chart.  The risks that can be encountered with and after placement of a breast expander placement were discussed and include the following but not limited to these: bleeding, infection, delayed healing, anesthesia risks, skin sensation changes, injury to structures including nerves, blood vessels, and muscles which may be temporary or permanent, allergies to tape, suture materials and glues, blood products, topical preparations or injected agents, skin contour irregularities, skin discoloration and swelling, deep vein thrombosis, cardiac and pulmonary complications, pain, which may persist, fluid accumulation, wrinkling of the skin over the expander, changes in nipple or breast sensation, expander leakage or rupture, faulty position of the expander, persistent pain, formation of tight scar tissue around the expander (capsular contracture), possible need for revisional surgery or staged procedures.   Electronically signed by: Clance Boll, PA-C 04/12/2022 4:55 PM

## 2022-04-13 ENCOUNTER — Encounter
Admission: RE | Admit: 2022-04-13 | Discharge: 2022-04-13 | Disposition: A | Discharge status: Home / Self Care | Payer: 59 | Source: Ambulatory Visit | Attending: General Surgery | Admitting: General Surgery

## 2022-04-13 ENCOUNTER — Telehealth: Payer: Self-pay

## 2022-04-13 ENCOUNTER — Other Ambulatory Visit: Payer: Self-pay

## 2022-04-13 DIAGNOSIS — Z170 Estrogen receptor positive status [ER+]: Secondary | ICD-10-CM | POA: Insufficient documentation

## 2022-04-13 DIAGNOSIS — Z01812 Encounter for preprocedural laboratory examination: Secondary | ICD-10-CM | POA: Insufficient documentation

## 2022-04-13 DIAGNOSIS — C50411 Malignant neoplasm of upper-outer quadrant of right female breast: Secondary | ICD-10-CM | POA: Insufficient documentation

## 2022-04-13 HISTORY — DX: Anemia, unspecified: D64.9

## 2022-04-13 HISTORY — DX: Myoneural disorder, unspecified: G70.9

## 2022-04-13 HISTORY — DX: Family history of other specified conditions: Z84.89

## 2022-04-13 NOTE — Telephone Encounter (Signed)
Faxed surgery clearance to Nelwyn Salisbury, PA-C

## 2022-04-13 NOTE — Patient Instructions (Addendum)
Your procedure is scheduled on: 04/21/22 - Wednesday Report to the Registration Desk on the 1st floor of the [facility]. To find out your arrival time, please call [phone] between 1PM - 3PM on: 04/20/22 - Tuesday If your arrival time is 6:00 am, do not arrive prior to that time as the [place] entrance doors do not open until 6:00 am.  REMEMBER: Instructions that are not followed completely may result in serious medical risk, up to and including death; or upon the discretion of your surgeon and anesthesiologist your surgery may need to be rescheduled.  Do not eat food after midnight the night before surgery.  No gum chewing, lozengers or hard candies.  You may however, drink CLEAR liquids up to 2 hours before you are scheduled to arrive for your surgery. Do not drink anything within 2 hours of your scheduled arrival time.  Type 1 and Type 2 diabetics should only drink water.  TAKE THESE MEDICATIONS THE MORNING OF SURGERY WITH A SIP OF WATER:  - Magnesium Cl-Calcium Carbonate  - oxyCODONE (ROXICODONE) if needed  - potassium chloride   One week prior to surgery: Stop Anti-inflammatories (NSAIDS) such as Advil, Aleve, Ibuprofen, Motrin, Naproxen, Naprosyn and Aspirin based products such as Excedrin, [place] Powder, BC Powder. Stop ANY OVER THE COUNTER supplements until after surgery.  You may however, continue to take Tylenol if needed for pain up until the day of surgery.  No Alcohol for 24 hours before or after surgery.  No Smoking including e-cigarettes for 24 hours prior to surgery.  No chewable tobacco products for at least 6 hours prior to surgery.  No nicotine patches on the day of surgery.  Do not use any "recreational" drugs for at least a week prior to your surgery.  Please be advised that the combination of cocaine and anesthesia may have negative outcomes, up to and including death. If you test positive for cocaine, your surgery will be cancelled.  On the  morning of surgery brush your teeth with toothpaste and water, you may rinse your mouth with mouthwash if you wish. Do not swallow any toothpaste or mouthwash.  Use CHG Soap or wipes as directed on instruction sheet.  Do not wear jewelry, make-up, hairpins, clips or nail polish.  Do not wear lotions, powders, or perfumes.   Do not shave body from the neck down 48 hours prior to surgery just in case you cut yourself which could leave a site for infection.  Also, freshly shaved skin may become irritated if using the CHG soap.  Contact lenses, hearing aids and dentures may not be worn into surgery.  Do not bring valuables to the hospital. [facility] is not responsible for any missing/lost belongings or valuables.   Notify your doctor if there is any change in your medical condition (cold, fever, infection).  Wear comfortable clothing (specific to your surgery type) to the hospital.  After surgery, you can help prevent lung complications by doing breathing exercises.  Take deep breaths and cough every 1-2 hours. Your doctor may order a device called an Incentive Spirometer to help you take deep breaths. When coughing or sneezing, hold a pillow firmly against your incision with both hands. This is called "splinting." Doing this helps protect your incision. It also decreases belly discomfort.  If you are being admitted to the hospital overnight, leave your suitcase in the car. After surgery it may be brought to your room.  If you are being discharged the day of  surgery, you will not be allowed to drive home. You will need a responsible adult (18 years or older) to drive you home and stay with you that night.   If you are taking public transportation, you will need to have a responsible adult (18 years or older) with you. Please confirm with your physician that it is acceptable to use public transportation.   Please call the [facility] at [phone] if you have any  questions about these instructions.  Surgery Visitation Policy:  Patients undergoing a surgery or procedure may have two family members or support persons with them as long as the person is not COVID-19 positive or experiencing its symptoms.   Inpatient Visitation:    Visiting hours are 7 a.m. to 8 p.m. Up to four visitors are allowed at one time in a patient room, including children. The visitors may rotate out with other people during the day. One designated support person (adult) may remain overnight.

## 2022-04-15 ENCOUNTER — Other Ambulatory Visit: Payer: Self-pay

## 2022-04-15 ENCOUNTER — Inpatient Hospital Stay: Payer: 59 | Attending: Internal Medicine

## 2022-04-15 ENCOUNTER — Inpatient Hospital Stay (HOSPITAL_BASED_OUTPATIENT_CLINIC_OR_DEPARTMENT_OTHER): Payer: 59 | Admitting: Medical Oncology

## 2022-04-15 ENCOUNTER — Encounter: Payer: Self-pay | Admitting: Student

## 2022-04-15 ENCOUNTER — Inpatient Hospital Stay: Payer: 59

## 2022-04-15 ENCOUNTER — Encounter: Payer: Self-pay | Admitting: Medical Oncology

## 2022-04-15 VITALS — BP 107/70 | HR 78 | Temp 98.3°F | Resp 20 | Ht 66.0 in | Wt 221.0 lb

## 2022-04-15 DIAGNOSIS — Z452 Encounter for adjustment and management of vascular access device: Secondary | ICD-10-CM | POA: Diagnosis not present

## 2022-04-15 DIAGNOSIS — C50211 Malignant neoplasm of upper-inner quadrant of right female breast: Secondary | ICD-10-CM | POA: Diagnosis not present

## 2022-04-15 DIAGNOSIS — D696 Thrombocytopenia, unspecified: Secondary | ICD-10-CM

## 2022-04-15 DIAGNOSIS — Z170 Estrogen receptor positive status [ER+]: Secondary | ICD-10-CM

## 2022-04-15 DIAGNOSIS — E876 Hypokalemia: Secondary | ICD-10-CM | POA: Insufficient documentation

## 2022-04-15 DIAGNOSIS — K760 Fatty (change of) liver, not elsewhere classified: Secondary | ICD-10-CM | POA: Insufficient documentation

## 2022-04-15 DIAGNOSIS — D500 Iron deficiency anemia secondary to blood loss (chronic): Secondary | ICD-10-CM | POA: Diagnosis not present

## 2022-04-15 DIAGNOSIS — C50411 Malignant neoplasm of upper-outer quadrant of right female breast: Secondary | ICD-10-CM

## 2022-04-15 DIAGNOSIS — G620 Drug-induced polyneuropathy: Secondary | ICD-10-CM | POA: Insufficient documentation

## 2022-04-15 DIAGNOSIS — M7989 Other specified soft tissue disorders: Secondary | ICD-10-CM | POA: Insufficient documentation

## 2022-04-15 DIAGNOSIS — D6959 Other secondary thrombocytopenia: Secondary | ICD-10-CM | POA: Diagnosis not present

## 2022-04-15 DIAGNOSIS — L03019 Cellulitis of unspecified finger: Secondary | ICD-10-CM

## 2022-04-15 LAB — COMPREHENSIVE METABOLIC PANEL
ALT: 18 U/L (ref 0–44)
AST: 20 U/L (ref 15–41)
Albumin: 3.1 g/dL — ABNORMAL LOW (ref 3.5–5.0)
Alkaline Phosphatase: 44 U/L (ref 38–126)
Anion gap: 7 (ref 5–15)
BUN: 13 mg/dL (ref 6–20)
CO2: 25 mmol/L (ref 22–32)
Calcium: 8.7 mg/dL — ABNORMAL LOW (ref 8.9–10.3)
Chloride: 107 mmol/L (ref 98–111)
Creatinine, Ser: 0.62 mg/dL (ref 0.44–1.00)
GFR, Estimated: >60 mL/min (ref >60)
Glucose, Bld: 149 mg/dL — ABNORMAL HIGH (ref 70–99)
Potassium: 3.4 mmol/L — ABNORMAL LOW (ref 3.5–5.1)
Sodium: 139 mmol/L (ref 135–145)
Total Bilirubin: 0.6 mg/dL (ref 0.3–1.2)
Total Protein: 6.1 g/dL — ABNORMAL LOW (ref 6.5–8.1)

## 2022-04-15 LAB — CBC WITH DIFFERENTIAL/PLATELET
Abs Immature Granulocytes: 0.02 10*3/uL (ref 0.00–0.07)
Basophils Absolute: 0.1 10*3/uL (ref 0.0–0.1)
Basophils Relative: 1 %
Eosinophils Absolute: 0.5 10*3/uL (ref 0.0–0.5)
Eosinophils Relative: 9 %
HCT: 31.1 % — ABNORMAL LOW (ref 36.0–46.0)
Hemoglobin: 10.3 g/dL — ABNORMAL LOW (ref 12.0–15.0)
Immature Granulocytes: 0 %
Lymphocytes Relative: 25 %
Lymphs Abs: 1.3 10*3/uL (ref 0.7–4.0)
MCH: 32.2 pg (ref 26.0–34.0)
MCHC: 33.1 g/dL (ref 30.0–36.0)
MCV: 97.2 fL (ref 80.0–100.0)
Monocytes Absolute: 0.4 10*3/uL (ref 0.1–1.0)
Monocytes Relative: 8 %
Neutro Abs: 2.9 10*3/uL (ref 1.7–7.7)
Neutrophils Relative %: 57 %
Platelets: 168 10*3/uL (ref 150–400)
RBC: 3.2 MIL/uL — ABNORMAL LOW (ref 3.87–5.11)
RDW: 13.5 % (ref 11.5–15.5)
WBC: 5.2 10*3/uL (ref 4.0–10.5)
nRBC: 0 % (ref 0.0–0.2)

## 2022-04-15 LAB — MAGNESIUM: Magnesium: 1.7 mg/dL (ref 1.7–2.4)

## 2022-04-15 MED ORDER — SODIUM CHLORIDE 0.9% FLUSH
10.0000 mL | Freq: Once | INTRAVENOUS | Status: AC
  Administered 2022-04-15: 10 mL via INTRAVENOUS
  Filled 2022-04-15: qty 10

## 2022-04-15 MED ORDER — HEPARIN SOD (PORK) LOCK FLUSH 100 UNIT/ML IV SOLN
500.0000 [IU] | Freq: Once | INTRAVENOUS | Status: AC
  Administered 2022-04-15: 500 [IU] via INTRAVENOUS
  Filled 2022-04-15: qty 5

## 2022-04-15 NOTE — Progress Notes (Signed)
RN Introduced supportive care services for referral with Gwenette Greet- OT screen. Patient has upcoming reconstructive breast surgery next Wed.  Discussed that Gwenette Greet will educate pt on tips to reduce the risk of lymphedema and scar tissue pain from her breast surgery. Gwenette Greet will discuss ROM exercises. pt agreeable to referral to [facility].

## 2022-04-15 NOTE — Progress Notes (Signed)
Surgical Clearance has been received from patient's oncology provider Nelwyn Salisbury PA-C for patient's upcoming surgery with Dr. Marla Roe.

## 2022-04-15 NOTE — Progress Notes (Addendum)
Symptom Management Halfway at [facility] [phone] [phone]  Patient Care Team: Latanya Maudlin, NP as PCP - General (Family Medicine) Daiva Huge, RN as Oncology Nurse Navigator Cammie Sickle, [name]   Name of the patient: Marilyn Page  [id number]  04-16-1976   Date of visit: 04/15/22  Reason for Consult: Marilyn Page is a 46 y.o. female with right breast TRIPLE POSITIVE with T4- Stage III currently s/p neoadjuvant TCH plus P chemotherapy #6 who presents today for:   Here for surgical clearance: Has mastectomy and reconstruction surgery on 04/21/2022 with Dr. Terri Piedra and Dr. Audelia Hives. She is excited to get this surgery behind her. She is a bit nervous about it but overall hopeful. She may need XRT after she heals depending on what is found during her surgery.   She reports that she continues to feel well. She is taking her magnesium and potassium supplement once daily. She denies any muscle cramps, fatigue, fevers. Eating and drinking well. Feeling stronger daily.    Denies any neurologic complaints. Denies recent fevers or illnesses. Denies any easy bleeding or bruising. Reports good appetite and denies weight loss. Denies chest pain. Denies any nausea, vomiting, constipation, or diarrhea. Denies urinary complaints. Patient offers no further specific complaints today.  Wt Readings from Last 3 Encounters:  04/15/22 221 lb (100.2 kg)  04/12/22 221 lb (100.2 kg)  04/02/22 225 lb (102.1 kg)     PAST MEDICAL HISTORY: Past Medical History:  Diagnosis Date   Anemia    Carcinoma of upper-outer quadrant of right breast in female, estrogen receptor positive ([place]) 10/26/2021   Diabetes mellitus without complication (HCC)    Family history of adverse reaction to anesthesia    grandmother had issues but doesnt know what the issue was, she was older.   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 Axilla Butterfly hydro clip-MACRO METASTATIC MAMMARY CARCINOMA   CESAREAN SECTION  2005/2008   DILATION AND CURETTAGE OF UTERUS  2003   PORTACATH PLACEMENT Left 11/09/2021   Procedure: INSERTION PORT-A-CATH;  Surgeon: Robert Bellow, MD;  Location: [facility] ORS;  Service: General;  Laterality: Left;   SKIN BIOPSY Right 11/09/2021   Procedure: PUNCH BIOPSY SKIN, TATTOO LYMPH NODE RIGHT AXILLA;  Surgeon: Robert Bellow, MD;  Location: [facility] ORS;  Service: General;  Laterality: Right;   WISDOM TOOTH EXTRACTION      HEMATOLOGY/ONCOLOGY HISTORY:  Oncology History Overview Note  MAY 2023-    Targeted ultrasound is performed, showing a 2.9 x 1.9 x 2.0 cm irregular hypoechoic mass right breast 10 o'clock position 6 cm from nipple at the site of palpable concern.   There is an abnormal 8 mm thickened right axillary lymph node.   There is skin thickening overlying the right breast. ------------------  A. BREAST, RIGHT AT 10:00, 6 CM FROM THE NIPPLE; ULTRASOUND-GUIDED CORE  NEEDLE BIOPSY:  - INVASIVE MAMMARY CARCINOMA, NO SPECIAL TYPE.   Size of invasive carcinoma: 11 mm in this sample  Histologic grade of invasive carcinoma: Grade 2  Glandular/tubular differentiation score: 3                       Nuclear pleomorphism score: 2                       Mitotic rate score: 1                       Total score: 6  Ductal carcinoma in situ: Present, intermediate grade  Lymphovascular invasion: Not identified   B. LYMPH NODE, RIGHT AXILLARY; ULTRASOUND-GUIDED CORE NEEDLE BIOPSY:  - 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 IN GREATEST  EXTENT.   Comment:  The  malignancy in the primary breast lesion appears morphologically  different from tumor in the lymph node sampling, with tumor present in  lymph node more suggestive of a histologic grade 3, with significantly  increased mitotic activity and pleomorphism. Due to the discrepancy in  these morphologic patterns, ER/PR/HER2 immunohistochemistry will be  performed on both blocks A1 and B1, with reflex to [place] for HER2 2+. The  results will be reported in an addendum.   ADDENDUM:  CASE SUMMARY: BREAST BIOMARKER TESTS - A - BREAST, RIGHT AT 10:00  Estrogen Receptor (ER) Status: POSITIVE          Percentage of cells with nuclear positivity: 71-80%          Average intensity of staining: Strong   Progesterone Receptor (PgR) Status: POSITIVE          Percentage of cells with nuclear positivity: 81-90%          Average intensity of staining: Strong   HER2 (by immunohistochemistry): POSITIVE (Score 3+)       Percentage of cells with uniform intense complete membrane  staining: 61-70%  HER2 displays a heterogeneous staining pattern, with areas showing a  strong 3+ staining pattern, and other regions with complete absence (0+)  of staining.   Ki-67: Not performed   CASE SUMMARY: BREAST BIOMARKER TESTS - B - RIGHT AXILLARY LYMPH NODE  Estrogen Receptor (ER) Status: POSITIVE          Percentage of cells with nuclear positivity: 81-90%          Average intensity of staining: Strong   Progesterone Receptor (PgR) Status: POSITIVE          Percentage of cells with nuclear positivity: 51-60%          Average intensity of staining: Strong   HER2 (by immunohistochemistry): NEGATIVE (Score 1+)  Ki-67: Not performed   #  MAY 2023- STAGE III-T4N1 ER/PR positive HER2 POSITIVE right breast cancer;LN-positive-ER/PR positive however 2 NEGATIVE s/p biopsy [see discussion below].  Discussed with Dr. Sedalia Muta tumor heterogeneity. BREAST MRI- JUNE 2023- The patient's known malignancy in the upper outer  quadrant of the right breast measures 3.8 x 2.8 x 3.6 cm. Numerous other suspicious masses are identified in the right breast as above involving the upper outer and upper inner quadrants, located both anterior and posterior to the known malignancy.  Numerous enhancing foci in the skin as above are worrisome for skin metastases ; Bx proven. Biopsy proven metastatic node in the right axilla.  No evidence of malignancy in the left breast;  Two mildly prominent right internal mammary nodes were not FDG avid on recent PET-CT imaging.  Currently on neoadjuvant chemotherapy- TCH plus P x6 cycles;  MUGA scan May 31st, 2023- -61%.    #  June 7th, 2023- NEO-ADJUVANT CHEMO- TCH+P   # Genetic counseling s/p- NEG for any deleterious mutations.     Carcinoma of upper-outer quadrant of right breast in female, estrogen receptor positive ([place])  10/26/2021 Initial Diagnosis   Carcinoma of upper-outer quadrant of right breast in female, estrogen receptor positive ([place])   10/26/2021 Cancer Staging   Staging form: Breast, AJCC 8th Edition - Clinical: Stage IB (cT2, cN1, cM0, G2, ER+, PR+, HER2+) - Signed by Cammie Sickle, MD on 10/26/2021 Histologic grading system: 3 grade system   11/10/2021 -  Chemotherapy   Patient is on Treatment Plan : BREAST  Docetaxel + Carboplatin + Trastuzumab + Pertuzumab  (TCHP) q21d      11/11/2021 - 01/13/2022 Chemotherapy   Patient is on Treatment Plan : BREAST  Docetaxel + Carboplatin + Trastuzumab + Pertuzumab  (TCHP) q21d       Genetic Testing   Negative genetic testing. No pathogenic variants identified on the [facility] CancerNext-Expanded+RNA panel. The report date is 11/29/2021.  The CancerNext-Expanded + RNAinsight gene panel offered by [employer] and includes sequencing and rearrangement analysis for the following 77 genes: IP, ALK, APC*, ATM*, AXIN2, BAP1, BARD1, BLM, BMPR1A, BRCA1*, BRCA2*, BRIP1*, CDC73, CDH1*,CDK4, CDKN1B, CDKN2A, CHEK2*, CTNNA1, DICER1, FANCC, FH, FLCN,  GALNT12, KIF1B, LZTR1, MAX, MEN1, MET, MLH1*, MSH2*, MSH3, MSH6*, MUTYH*, NBN, NF1*, NF2, NTHL1, PALB2*, PHOX2B, PMS2*, POT1, PRKAR1A, PTCH1, PTEN*, RAD51C*, RAD51D*,RB1, RECQL, RET, SDHA, SDHAF2, SDHB, SDHC, SDHD, SMAD4, SMARCA4, SMARCB1, SMARCE1, STK11, SUFU, TMEM127, TP53*,TSC1, TSC2, VHL and XRCC2 (sequencing and deletion/duplication); EGFR, EGLN1, HOXB13, KIT, MITF, PDGFRA, POLD1 and POLE (sequencing only); EPCAM and GREM1 (deletion/duplication only).     ALLERGIES:  is allergic to metformin.  MEDICATIONS:  Current Outpatient Medications  Medication Sig Dispense Refill   acetaminophen (TYLENOL) 325 MG tablet Take 2 tablets (650 mg total) by mouth every 6 (six) hours as needed for mild pain (or Fever >/= 101).     ascorbic acid (VITAMIN C) 500 MG tablet Take 1 tablet (500 mg total) by mouth daily. 30 tablet    calcium-vitamin D (OSCAL WITH D) 500-5 MG-MCG tablet Take 1 tablet by mouth 2 (two) times daily. 60 tablet 2   lidocaine-prilocaine (EMLA) cream Apply on the port. 30 -45 min  prior to port access. 30 g 3   Magnesium Cl-Calcium Carbonate (SLOW MAGNESIUM/CALCIUM) 70-117 MG TBEC Take 1 tablet by mouth 2 (two) times daily. 60 tablet 6   mupirocin ointment (BACTROBAN) 2 % Apply 1 Application topically 2 (two) times daily. 30 g 0   ondansetron (ZOFRAN-ODT) 8 MG disintegrating tablet Take 8 mg by mouth every 8 (eight) hours as needed for nausea or vomiting.     ONETOUCH VERIO test strip SMARTSIG:1 Each Via Meter Daily PRN     potassium chloride (KLOR-CON) 20 MEQ packet Take 20 mEq by mouth 2 (two) times daily. (Patient taking differently: Take 20 mEq by mouth daily.) 60 packet 3   cephALEXin (KEFLEX) 500 MG capsule Take 1 capsule (500 mg total) by mouth 4 (four) times daily for 3 days. (Patient not taking: Reported on 04/15/2022) 12 capsule 0   Cyanocobalamin (VITAMIN B-12 PO) Take 1 tablet by mouth daily. (Patient not taking: Reported on 04/15/2022)     diazepam (VALIUM) 2 MG tablet Take 1  tablet (2 mg total) by mouth every 12 (twelve) hours as needed for up to 20 doses for muscle spasms. (Patient not taking: Reported on 04/15/2022) 20 tablet 0   ferrous sulfate 325 (65 FE)  MG tablet Take 325 mg by mouth 2 (two) times daily with a meal. (Patient not taking: Reported on 04/15/2022)     Multiple Vitamin (MULTIVITAMIN WITH MINERALS) TABS tablet Take 1 tablet by mouth daily. (Patient not taking: Reported on 04/15/2022)     ondansetron (ZOFRAN) 4 MG tablet Take 1 tablet (4 mg total) by mouth every 8 (eight) hours as needed for up to 20 doses for nausea or vomiting. (Patient not taking: Reported on 04/15/2022) 20 tablet 0   oxyCODONE (ROXICODONE) 5 MG immediate release tablet Take 1 tablet (5 mg total) by mouth every 8 (eight) hours as needed for up to 20 doses for severe pain. (Patient not taking: Reported on 04/15/2022) 20 tablet 0   prochlorperazine (COMPAZINE) 10 MG tablet Take 1 tablet (10 mg total) by mouth every 6 (six) hours as needed for nausea or vomiting. (Patient not taking: Reported on 04/15/2022) 40 tablet 1   No current facility-administered medications for this visit.    VITAL SIGNS: BP 107/70   Pulse 78   Temp 98.3 F (36.8 C) (Tympanic)   Resp 20   Ht _0  (1.676 m)   Wt 221 lb (100.2 kg)   LMP  (LMP Unknown)   BMI 35.67 kg/m  Filed Weights   04/15/22 0930  Weight: 221 lb (100.2 kg)    Estimated body mass index is 35.67 kg/m as calculated from the following:   Height as of this encounter: _1  (1.676 m).   Weight as of this encounter: 221 lb (100.2 kg).  LABS: CBC:    Component Value Date/Time   WBC 5.2 04/15/2022 0922   HGB 10.3 (L) 04/15/2022 0922   HGB 7.8 (L) 04/23/2021 1431   HCT 31.1 (L) 04/15/2022 0922   HCT 25.5 (L) 04/23/2021 1431   PLT 168 04/15/2022 0922   PLT 384 04/23/2021 1431   MCV 97.2 04/15/2022 0922   MCV 84 04/23/2021 1431   NEUTROABS 2.9 04/15/2022 0922   NEUTROABS 5.4 04/23/2021 1431   LYMPHSABS 1.3 04/15/2022 0922   LYMPHSABS  2.2 04/23/2021 1431   MONOABS 0.4 04/15/2022 0922   EOSABS 0.5 04/15/2022 0922   EOSABS 0.2 04/23/2021 1431   BASOSABS 0.1 04/15/2022 0922   BASOSABS 0.1 04/23/2021 1431   Comprehensive Metabolic Panel:    Component Value Date/Time   NA 139 04/15/2022 0922   NA 143 09/26/2017 1512   K 3.4 (L) 04/15/2022 0922   CL 107 04/15/2022 0922   CO2 25 04/15/2022 0922   BUN 13 04/15/2022 0922   BUN 9 09/26/2017 1512   CREATININE 0.62 04/15/2022 0922   GLUCOSE 149 (H) 04/15/2022 0922   CALCIUM 8.7 (L) 04/15/2022 0922   AST 20 04/15/2022 0922   ALT 18 04/15/2022 0922   ALKPHOS 44 04/15/2022 0922   BILITOT 0.6 04/15/2022 0922   BILITOT <0.2 09/26/2017 1512   PROT 6.1 (L) 04/15/2022 0922   PROT 6.8 09/26/2017 1512   ALBUMIN 3.1 (L) 04/15/2022 0922   ALBUMIN 3.9 09/26/2017 1512    RADIOGRAPHIC STUDIES: No results found.  PERFORMANCE STATUS (ECOG) : 1 - Symptomatic but completely ambulatory  Review of Systems Unless otherwise noted, a complete review of systems is negative.  Physical Exam General: NAD Cardiovascular: regular rate and rhythm Pulmonary: clear ant fields Abdomen: soft, nontender, + bowel sounds GU: no suprapubic tenderness Extremities: Scant non-pitting edema of bilateral feet not extending into legs, no joint deformities MSK: Negative Homans sign Skin: no rashes, no erythema Neurological: Weakness but  otherwise nonfocal  Assessment and Plan- Patient is a 46 y.o. female    Encounter Diagnoses  Name Primary?   Carcinoma of upper-outer quadrant of right breast in female, estrogen receptor positive ([place]) Yes   Foot swelling    Hypomagnesemia    Hypokalemia    Iron deficiency anemia due to chronic blood loss    Thrombocytopenia (HCC)    Infection of nail bed of finger, unspecified laterality      Foot swelling: Improved with increased protein in diet. Not occurring today.  Hypomagnesemia- Chronic and improved on oral supplementation which she will continue.   Hypokalemia-Chronic and improved but still slightly low at 3.4 on once daily supplementation. She would likely benefit from alternating BID and daily dosing.  IDA- Improving. Macrocytosis has improved on oral B12 supplementation.  Thrombocytopenia- Secondary to chemotherapy. Resolved.  Nail infection- Chronic and improved.    Vitals are stable. Labs continue to improve and are non-critical. She is feeling strong without recent illness or infection. Therefore I do not see a reason why surgery should be held. She is cleared for her surgery on 04/21/2022.    Disposition: Alternating BID/daily doing of oral potassium supplement. Continue magnesium supplement once daily RTC at the end of the month APP, labs (CBC w/, CMP, mag, B12) +- fluids/mag/k.  Referral to OT with [facility] submitted for after she heals from her  surgery   Patient expressed understanding and was in agreement with this plan. She also understands that She can call clinic at any time with any questions, concerns, or complaints.   Thank you for allowing me to participate in the care of this very pleasant patient.   Time Total: 25  Visit consisted of counseling and education dealing with the complex and emotionally intense issues of symptom management in the setting of serious illness.Greater than 50%  of this time was spent counseling and coordinating care related to the above assessment and plan.  Signed by: Nelwyn Salisbury, PA-C

## 2022-04-16 ENCOUNTER — Other Ambulatory Visit: Payer: Self-pay

## 2022-04-17 ENCOUNTER — Telehealth: Payer: Self-pay | Admitting: *Deleted

## 2022-04-17 NOTE — Telephone Encounter (Signed)
Received Confirmation of Order from [employer] requesting signature and return.  Given to provider to sign and return.    Confirmation of Order signed and faxed back to [facility].   Confirmation received and copy scanned into the chart.//AB/CMA

## 2022-04-20 ENCOUNTER — Other Ambulatory Visit: Payer: Self-pay | Admitting: General Surgery

## 2022-04-20 DIAGNOSIS — C50411 Malignant neoplasm of upper-outer quadrant of right female breast: Secondary | ICD-10-CM

## 2022-04-21 ENCOUNTER — Ambulatory Visit
Admission: RE | Admit: 2022-04-21 | Discharge: 2022-04-21 | Disposition: A | Discharge status: Home / Self Care | Payer: 59 | Attending: General Surgery | Admitting: General Surgery

## 2022-04-21 ENCOUNTER — Encounter
Admission: RE | Disposition: A | Discharge status: Home / Self Care | Payer: Self-pay | Source: Home / Self Care | Attending: General Surgery

## 2022-04-21 ENCOUNTER — Other Ambulatory Visit: Payer: Self-pay

## 2022-04-21 ENCOUNTER — Ambulatory Visit
Admission: RE | Admit: 2022-04-21 | Discharge: 2022-04-21 | Disposition: A | Discharge status: Home / Self Care | Payer: 59 | Source: Ambulatory Visit | Attending: General Surgery | Admitting: General Surgery

## 2022-04-21 ENCOUNTER — Inpatient Hospital Stay: Admission: RE | Admit: 2022-04-21 | Payer: 59 | Source: Ambulatory Visit

## 2022-04-21 ENCOUNTER — Ambulatory Visit: Payer: 59 | Admitting: Anesthesiology

## 2022-04-21 ENCOUNTER — Ambulatory Visit: Payer: 59

## 2022-04-21 ENCOUNTER — Encounter
Admission: RE | Admit: 2022-04-21 | Discharge: 2022-04-21 | Disposition: A | Discharge status: Home / Self Care | Payer: 59 | Source: Ambulatory Visit | Attending: General Surgery | Admitting: General Surgery

## 2022-04-21 ENCOUNTER — Encounter: Payer: Self-pay | Admitting: General Surgery

## 2022-04-21 DIAGNOSIS — C50411 Malignant neoplasm of upper-outer quadrant of right female breast: Secondary | ICD-10-CM

## 2022-04-21 DIAGNOSIS — Z170 Estrogen receptor positive status [ER+]: Secondary | ICD-10-CM

## 2022-04-21 DIAGNOSIS — E669 Obesity, unspecified: Secondary | ICD-10-CM | POA: Insufficient documentation

## 2022-04-21 DIAGNOSIS — Z01812 Encounter for preprocedural laboratory examination: Secondary | ICD-10-CM | POA: Diagnosis not present

## 2022-04-21 DIAGNOSIS — Z6835 Body mass index (BMI) 35.0-35.9, adult: Secondary | ICD-10-CM | POA: Diagnosis not present

## 2022-04-21 DIAGNOSIS — Z853 Personal history of malignant neoplasm of breast: Secondary | ICD-10-CM | POA: Insufficient documentation

## 2022-04-21 DIAGNOSIS — Z9221 Personal history of antineoplastic chemotherapy: Secondary | ICD-10-CM | POA: Diagnosis not present

## 2022-04-21 DIAGNOSIS — Z421 Encounter for breast reconstruction following mastectomy: Secondary | ICD-10-CM

## 2022-04-21 DIAGNOSIS — E119 Type 2 diabetes mellitus without complications: Secondary | ICD-10-CM | POA: Insufficient documentation

## 2022-04-21 HISTORY — PX: BREAST BIOPSY: SHX20

## 2022-04-21 HISTORY — PX: BREAST RECONSTRUCTION WITH PLACEMENT OF TISSUE EXPANDER AND FLEX HD (ACELLULAR HYDRATED DERMIS): SHX6295

## 2022-04-21 HISTORY — PX: SIMPLE MASTECTOMY WITH AXILLARY SENTINEL NODE BIOPSY: SHX6098

## 2022-04-21 LAB — POCT PREGNANCY, URINE: Preg Test, Ur: NEGATIVE

## 2022-04-21 LAB — GLUCOSE, CAPILLARY
Glucose-Capillary: 127 mg/dL — ABNORMAL HIGH (ref 70–99)
Glucose-Capillary: 150 mg/dL — ABNORMAL HIGH (ref 70–99)

## 2022-04-21 SURGERY — SIMPLE MASTECTOMY WITH AXILLARY SENTINEL NODE BIOPSY
Anesthesia: General | Site: Breast | Laterality: Right

## 2022-04-21 MED ORDER — BUPIVACAINE HCL (PF) 0.5 % IJ SOLN
INTRAMUSCULAR | Status: AC
  Filled 2022-04-21: qty 10

## 2022-04-21 MED ORDER — BUPIVACAINE LIPOSOME 1.3 % IJ SUSP
INTRAMUSCULAR | Status: AC
  Filled 2022-04-21: qty 20

## 2022-04-21 MED ORDER — LIDOCAINE HCL (PF) 1 % IJ SOLN
4.0000 mL | Freq: Once | INTRAMUSCULAR | Status: AC
  Administered 2022-04-21: 4 mL
  Filled 2022-04-21: qty 4

## 2022-04-21 MED ORDER — CEFAZOLIN SODIUM-DEXTROSE 2-4 GM/100ML-% IV SOLN: 2.0000 g | INTRAVENOUS | Status: DC

## 2022-04-21 MED ORDER — CEFAZOLIN SODIUM-DEXTROSE 2-4 GM/100ML-% IV SOLN
INTRAVENOUS | Status: AC
  Filled 2022-04-21: qty 100

## 2022-04-21 MED ORDER — ACETAMINOPHEN 10 MG/ML IV SOLN
INTRAVENOUS | Status: DC | PRN
  Administered 2022-04-21: 1000 mg via INTRAVENOUS

## 2022-04-21 MED ORDER — HYDROMORPHONE HCL 1 MG/ML IJ SOLN
INTRAMUSCULAR | Status: AC
  Filled 2022-04-21: qty 1

## 2022-04-21 MED ORDER — DEXMEDETOMIDINE HCL IN NACL 200 MCG/50ML IV SOLN
INTRAVENOUS | Status: DC | PRN
  Administered 2022-04-21: 12 ug via INTRAVENOUS

## 2022-04-21 MED ORDER — SUCCINYLCHOLINE CHLORIDE 200 MG/10ML IV SOSY
PREFILLED_SYRINGE | INTRAVENOUS | Status: DC | PRN
  Administered 2022-04-21: 100 mg via INTRAVENOUS

## 2022-04-21 MED ORDER — GLYCOPYRROLATE 0.2 MG/ML IJ SOLN
INTRAMUSCULAR | Status: DC | PRN
  Administered 2022-04-21: .2 mg via INTRAVENOUS

## 2022-04-21 MED ORDER — HYDROMORPHONE HCL 1 MG/ML IJ SOLN
INTRAMUSCULAR | Status: DC | PRN
  Administered 2022-04-21: 1 mg via INTRAVENOUS

## 2022-04-21 MED ORDER — CHLORHEXIDINE GLUCONATE 0.12 % MT SOLN
OROMUCOSAL | Status: AC
  Administered 2022-04-21: 15 mL via OROMUCOSAL
  Filled 2022-04-21: qty 15

## 2022-04-21 MED ORDER — BUPIVACAINE HCL (PF) 0.5 % IJ SOLN
INTRAMUSCULAR | Status: DC | PRN
  Administered 2022-04-21: 10 mL

## 2022-04-21 MED ORDER — CHLORHEXIDINE GLUCONATE CLOTH 2 % EX PADS: 6.0000 | MEDICATED_PAD | Freq: Once | CUTANEOUS | Status: DC

## 2022-04-21 MED ORDER — FENTANYL CITRATE (PF) 100 MCG/2ML IJ SOLN
INTRAMUSCULAR | Status: DC | PRN
  Administered 2022-04-21: 50 ug via INTRAVENOUS

## 2022-04-21 MED ORDER — MIDAZOLAM HCL 2 MG/2ML IJ SOLN
INTRAMUSCULAR | Status: AC
  Filled 2022-04-21: qty 2

## 2022-04-21 MED ORDER — REMIFENTANIL HCL 1 MG IV SOLR
INTRAVENOUS | Status: AC
  Filled 2022-04-21: qty 1000

## 2022-04-21 MED ORDER — OXYCODONE HCL 5 MG PO TABS
ORAL_TABLET | ORAL | Status: AC
  Administered 2022-04-21: 5 mg via ORAL
  Filled 2022-04-21: qty 1

## 2022-04-21 MED ORDER — REMIFENTANIL HCL 1 MG IV SOLR
INTRAVENOUS | Status: DC | PRN
  Administered 2022-04-21: .1 ug/kg/min via INTRAVENOUS

## 2022-04-21 MED ORDER — MIDAZOLAM HCL 2 MG/2ML IJ SOLN
INTRAMUSCULAR | Status: AC
  Administered 2022-04-21: 1 mg via INTRAVENOUS
  Filled 2022-04-21: qty 2

## 2022-04-21 MED ORDER — FENTANYL CITRATE (PF) 100 MCG/2ML IJ SOLN
25.0000 ug | INTRAMUSCULAR | Status: DC | PRN
  Administered 2022-04-21: 50 ug via INTRAVENOUS

## 2022-04-21 MED ORDER — MIDAZOLAM HCL 2 MG/2ML IJ SOLN
1.0000 mg | INTRAMUSCULAR | Status: AC | PRN
  Administered 2022-04-21: 1 mg via INTRAVENOUS

## 2022-04-21 MED ORDER — TECHNETIUM TC 99M TILMANOCEPT KIT
1.0500 | PACK | Freq: Once | INTRAVENOUS | Status: AC | PRN
  Administered 2022-04-21: 1.05 via INTRADERMAL

## 2022-04-21 MED ORDER — FENTANYL CITRATE (PF) 100 MCG/2ML IJ SOLN
INTRAMUSCULAR | Status: AC
  Administered 2022-04-21: 50 ug via INTRAVENOUS
  Filled 2022-04-21: qty 2

## 2022-04-21 MED ORDER — PHENYLEPHRINE HCL (PRESSORS) 10 MG/ML IV SOLN
INTRAVENOUS | Status: AC
  Filled 2022-04-21: qty 1

## 2022-04-21 MED ORDER — CHLORHEXIDINE GLUCONATE 0.12 % MT SOLN: 15.0000 mL | Freq: Once | OROMUCOSAL | Status: AC

## 2022-04-21 MED ORDER — MIDAZOLAM HCL 2 MG/2ML IJ SOLN
INTRAMUSCULAR | Status: DC | PRN
  Administered 2022-04-21: 2 mg via INTRAVENOUS

## 2022-04-21 MED ORDER — LIDOCAINE-EPINEPHRINE 1 %-1:100000 IJ SOLN
8.0000 mL | Freq: Once | INTRAMUSCULAR | Status: AC
  Administered 2022-04-21: 8 mL
  Filled 2022-04-21: qty 8

## 2022-04-21 MED ORDER — OXYCODONE HCL 5 MG/5ML PO SOLN: 5.0000 mg | Freq: Once | ORAL | Status: AC | PRN

## 2022-04-21 MED ORDER — METHYLENE BLUE 1 % INJ SOLN
INTRAVENOUS | Status: DC | PRN
  Administered 2022-04-21: 4 mL

## 2022-04-21 MED ORDER — METHYLENE BLUE 1 % INJ SOLN
INTRAVENOUS | Status: AC
  Filled 2022-04-21: qty 10

## 2022-04-21 MED ORDER — PHENYLEPHRINE HCL-NACL 20-0.9 MG/250ML-% IV SOLN
INTRAVENOUS | Status: DC | PRN
  Administered 2022-04-21: 25 ug/min via INTRAVENOUS

## 2022-04-21 MED ORDER — DEXAMETHASONE SODIUM PHOSPHATE 10 MG/ML IJ SOLN
INTRAMUSCULAR | Status: DC | PRN
  Administered 2022-04-21: 10 mg via INTRAVENOUS

## 2022-04-21 MED ORDER — 0.9 % SODIUM CHLORIDE (POUR BTL) OPTIME
TOPICAL | Status: DC | PRN
  Administered 2022-04-21: 500 mL

## 2022-04-21 MED ORDER — ONDANSETRON HCL 4 MG/2ML IJ SOLN: 4.0000 mg | Freq: Once | INTRAMUSCULAR | Status: DC | PRN

## 2022-04-21 MED ORDER — LIDOCAINE HCL (CARDIAC) PF 100 MG/5ML IV SOSY
PREFILLED_SYRINGE | INTRAVENOUS | Status: DC | PRN
  Administered 2022-04-21: 100 mg via INTRAVENOUS

## 2022-04-21 MED ORDER — FENTANYL CITRATE (PF) 100 MCG/2ML IJ SOLN
INTRAMUSCULAR | Status: AC
  Filled 2022-04-21: qty 2

## 2022-04-21 MED ORDER — BUPIVACAINE LIPOSOME 1.3 % IJ SUSP
INTRAMUSCULAR | Status: DC | PRN
  Administered 2022-04-21: 20 mL

## 2022-04-21 MED ORDER — STERILE WATER FOR IRRIGATION IR SOLN
Status: DC | PRN
  Administered 2022-04-21: 1000 mL

## 2022-04-21 MED ORDER — BUPIVACAINE-EPINEPHRINE (PF) 0.25% -1:200000 IJ SOLN
INTRAMUSCULAR | Status: AC
  Filled 2022-04-21: qty 30

## 2022-04-21 MED ORDER — PROPOFOL 10 MG/ML IV BOLUS
INTRAVENOUS | Status: DC | PRN
  Administered 2022-04-21: 160 mg via INTRAVENOUS

## 2022-04-21 MED ORDER — FAMOTIDINE 20 MG PO TABS
ORAL_TABLET | ORAL | Status: AC
  Administered 2022-04-21: 20 mg via ORAL
  Filled 2022-04-21: qty 1

## 2022-04-21 MED ORDER — FAMOTIDINE 20 MG PO TABS: 20.0000 mg | ORAL_TABLET | Freq: Once | ORAL | Status: AC

## 2022-04-21 MED ORDER — OXYCODONE HCL 5 MG PO TABS: 5.0000 mg | ORAL_TABLET | Freq: Once | ORAL | Status: AC | PRN

## 2022-04-21 MED ORDER — ORAL CARE MOUTH RINSE: 15.0000 mL | Freq: Once | OROMUCOSAL | Status: AC

## 2022-04-21 MED ORDER — ONDANSETRON HCL 4 MG/2ML IJ SOLN
INTRAMUSCULAR | Status: DC | PRN
  Administered 2022-04-21 (×2): 4 mg via INTRAVENOUS

## 2022-04-21 MED ORDER — SODIUM CHLORIDE 0.9 % IV SOLN: INTRAVENOUS | Status: DC

## 2022-04-21 MED ORDER — CEFAZOLIN SODIUM-DEXTROSE 2-4 GM/100ML-% IV SOLN
2.0000 g | INTRAVENOUS | Status: AC
  Administered 2022-04-21: 1 g via INTRAVENOUS
  Administered 2022-04-21: 2 g via INTRAVENOUS

## 2022-04-21 MED ORDER — ACETAMINOPHEN 10 MG/ML IV SOLN
INTRAVENOUS | Status: AC
  Filled 2022-04-21: qty 100

## 2022-04-21 MED ORDER — EPHEDRINE SULFATE (PRESSORS) 50 MG/ML IJ SOLN
INTRAMUSCULAR | Status: DC | PRN
  Administered 2022-04-21: 5 mg via INTRAVENOUS

## 2022-04-21 MED ORDER — ACETAMINOPHEN 10 MG/ML IV SOLN: 1000.0000 mg | Freq: Once | INTRAVENOUS | Status: DC | PRN

## 2022-04-21 SURGICAL SUPPLY — 100 items
APPLIER CLIP 11 MED OPEN (CLIP)
APPLIER CLIP 13 LRG OPEN (CLIP)
BAG DECANTER FOR FLEXI CONT (MISCELLANEOUS) ×2 IMPLANT
BINDER BREAST XLRG (GAUZE/BANDAGES/DRESSINGS) IMPLANT
BIOPATCH WHT 1IN DISK W/4.0 H (GAUZE/BANDAGES/DRESSINGS) ×4 IMPLANT
BLADE BOVIE TIP EXT 4 (BLADE) ×2 IMPLANT
BLADE PHOTON ILLUMINATED (MISCELLANEOUS) IMPLANT
BLADE SURG 15 STRL LF DISP TIS (BLADE) ×2 IMPLANT
BLADE SURG 15 STRL SS (BLADE) ×2
BLADE SURG 15 STRL SS SAFETY (BLADE) ×2 IMPLANT
BNDG GAUZE DERMACEA FLUFF 4 (GAUZE/BANDAGES/DRESSINGS) ×4 IMPLANT
BULB RESERV EVAC DRAIN JP 100C (MISCELLANEOUS) ×4 IMPLANT
CHLORAPREP W/TINT 26 (MISCELLANEOUS) ×2 IMPLANT
CLIP APPLIE 11 MED OPEN (CLIP) IMPLANT
CLIP APPLIE 13 LRG OPEN (CLIP) IMPLANT
CNTNR SPEC 2.5X3XGRAD LEK (MISCELLANEOUS) ×6
CONT SPEC 4OZ STER OR WHT (MISCELLANEOUS) ×6
CONTAINER SPEC 2.5X3XGRAD LEK (MISCELLANEOUS) ×6 IMPLANT
COVER PROBE GAMMA FINDER SLV (MISCELLANEOUS) ×2 IMPLANT
DERMABOND ADVANCED .7 DNX12 (GAUZE/BANDAGES/DRESSINGS) ×4 IMPLANT
DEVICE DUBIN SPECIMEN MAMMOGRA (MISCELLANEOUS) IMPLANT
DRAIN CHANNEL JP 15F RND 16 (MISCELLANEOUS) IMPLANT
DRAIN CHANNEL JP 19F (MISCELLANEOUS) ×4 IMPLANT
DRAPE LAPAROTOMY TRNSV 106X77 (MISCELLANEOUS) ×2 IMPLANT
DRSG GAUZE FLUFF 36X18 (GAUZE/BANDAGES/DRESSINGS) ×2 IMPLANT
DRSG OPSITE POSTOP 4X10 (GAUZE/BANDAGES/DRESSINGS) IMPLANT
DRSG TEGADERM 2-3/8X2-3/4 SM (GAUZE/BANDAGES/DRESSINGS) IMPLANT
DRSG TELFA 3X8 NADH STRL (GAUZE/BANDAGES/DRESSINGS) ×2 IMPLANT
ELECT CAUTERY BLADE 6.4 (BLADE) ×2 IMPLANT
ELECT CAUTERY BLADE TIP 2.5 (TIP) ×4
ELECT REM PT RETURN 9FT ADLT (ELECTROSURGICAL) ×2
ELECTRODE CAUTERY BLDE TIP 2.5 (TIP) ×4 IMPLANT
ELECTRODE REM PT RTRN 9FT ADLT (ELECTROSURGICAL) ×2 IMPLANT
GAUZE 4X4 16PLY ~~LOC~~+RFID DBL (SPONGE) ×4 IMPLANT
GLOVE BIO SURGEON STRL SZ 6.5 (GLOVE) ×8 IMPLANT
GLOVE BIO SURGEON STRL SZ7.5 (GLOVE) ×2 IMPLANT
GLOVE SURG UNDER LTX SZ8 (GLOVE) ×2 IMPLANT
GOWN STRL REUS W/ TWL LRG LVL3 (GOWN DISPOSABLE) ×12 IMPLANT
GOWN STRL REUS W/TWL LRG LVL3 (GOWN DISPOSABLE) ×12
IMPL EXPANDER BREAST 535CC (Breast) IMPLANT
IMPLANT EXPANDER BREAST 535CC (Breast) ×2 IMPLANT
IV NS 1000ML (IV SOLUTION)
IV NS 1000ML BAXH (IV SOLUTION) IMPLANT
IV NS 500ML (IV SOLUTION)
IV NS 500ML BAXH (IV SOLUTION) IMPLANT
KIT FILL ASEPTIC TRANSFER (MISCELLANEOUS) ×2 IMPLANT
LABEL OR SOLS (LABEL) ×2 IMPLANT
MANIFOLD NEPTUNE II (INSTRUMENTS) ×4 IMPLANT
NDL FILTER BLUNT 18X1 1/2 (NEEDLE) ×4 IMPLANT
NDL SAFETY ECLIP 18X1.5 (MISCELLANEOUS) IMPLANT
NEEDLE FILTER BLUNT 18X1 1/2 (NEEDLE) ×4 IMPLANT
NEEDLE HYPO 22GX1.5 SAFETY (NEEDLE) IMPLANT
NS IRRIG 500ML POUR BTL (IV SOLUTION) IMPLANT
PACK BASIN MAJOR ARMC (MISCELLANEOUS) ×2 IMPLANT
PACK BASIN MINOR ARMC (MISCELLANEOUS) ×2 IMPLANT
PACK SPY-PHI (KITS) IMPLANT
PACK UNIVERSAL (MISCELLANEOUS) IMPLANT
PAD ABD DERMACEA PRESS 5X9 (GAUZE/BANDAGES/DRESSINGS) ×8 IMPLANT
PIN SAFETY STRL (MISCELLANEOUS) ×4 IMPLANT
RETRACTOR RING XSMALL (MISCELLANEOUS) IMPLANT
RTRCTR WOUND ALEXIS 13CM XS SH (MISCELLANEOUS)
SHEARS FOC LG CVD HARMONIC 17C (MISCELLANEOUS) IMPLANT
SOL PREP PVP 2OZ (MISCELLANEOUS) ×4
SOLUTION PREP PVP 2OZ (MISCELLANEOUS) ×4 IMPLANT
SPIKE FLUID TRANSFER (MISCELLANEOUS) IMPLANT
SPONGE T-LAP 18X18 ~~LOC~~+RFID (SPONGE) ×12 IMPLANT
STRIP CLOSURE SKIN 1/2X4 (GAUZE/BANDAGES/DRESSINGS) ×4 IMPLANT
SUT ETHILON 3-0 FS-10 30 BLK (SUTURE) ×2
SUT MNCRL 3-0 UNDYED SH (SUTURE) ×4 IMPLANT
SUT MNCRL 4-0 (SUTURE) ×4
SUT MNCRL 4-0 27XMFL (SUTURE) ×4
SUT MNCRL+ 5-0 UNDYED PC-3 (SUTURE) ×4 IMPLANT
SUT MONOCRYL 3-0 UNDYED (SUTURE) ×8
SUT MONOCRYL 5-0 (SUTURE) ×4
SUT PDS II 3-0 (SUTURE) IMPLANT
SUT PDS PLUS 2 (SUTURE) ×4
SUT PDS PLUS AB 2-0 CT-1 (SUTURE) ×12 IMPLANT
SUT SILK 2 0 (SUTURE) ×2
SUT SILK 2-0 30XBRD TIE 12 (SUTURE) ×2 IMPLANT
SUT SILK 3 0 (SUTURE) ×2
SUT SILK 3 0 SH 30 (SUTURE) IMPLANT
SUT SILK 3-0 18XBRD TIE 12 (SUTURE) ×2 IMPLANT
SUT SILK 4 0 SH (SUTURE) ×4 IMPLANT
SUT VIC AB 2-0 CT1 27 (SUTURE) ×8
SUT VIC AB 2-0 CT1 TAPERPNT 27 (SUTURE) ×8 IMPLANT
SUT VIC AB 3-0 54X BRD REEL (SUTURE) ×4 IMPLANT
SUT VIC AB 3-0 BRD 54 (SUTURE) ×8
SUT VIC AB 3-0 SH 27 (SUTURE) ×2
SUT VIC AB 3-0 SH 27X BRD (SUTURE) ×2 IMPLANT
SUTURE EHLN 3-0 FS-10 30 BLK (SUTURE) ×2 IMPLANT
SUTURE MNCRL 4-0 27XMF (SUTURE) ×4 IMPLANT
SWABSTK COMLB BENZOIN TINCTURE (MISCELLANEOUS) ×2 IMPLANT
SYR 10ML LL (SYRINGE) ×4 IMPLANT
SYR BULB IRRIG 60ML STRL (SYRINGE) ×2 IMPLANT
TAPE TRANSPORE STRL 2 31045 (GAUZE/BANDAGES/DRESSINGS) ×2 IMPLANT
TISSUE FLEXHD PERF PLIAB 8X16 (Tissue) IMPLANT
TOWEL OR 17X26 4PK STRL BLUE (TOWEL DISPOSABLE) ×2 IMPLANT
TRAP FLUID SMOKE EVACUATOR (MISCELLANEOUS) ×4 IMPLANT
WATER STERILE IRR 1000ML POUR (IV SOLUTION) IMPLANT
WATER STERILE IRR 500ML POUR (IV SOLUTION) ×2 IMPLANT

## 2022-04-21 NOTE — H&P (Signed)
Marilyn Page [id number] [birth year]     HPI:  46 y/o woman who has completed neo-adjuvant chemotherapy for inflammatory breast cancer.  For mastectomy with immediate reconstruction.  Plan for SLN biopsy, tolerated wire localization of previously + node and technetium injection well.   Medications Prior to Admission  Medication Sig Dispense Refill Last Dose   acetaminophen (TYLENOL) 325 MG tablet Take 2 tablets (650 mg total) by mouth every 6 (six) hours as needed for mild pain (or Fever >/= 101).   Past Month   ascorbic acid (VITAMIN C) 500 MG tablet Take 1 tablet (500 mg total) by mouth daily. 30 tablet  04/20/2022   calcium-vitamin D (OSCAL WITH D) 500-5 MG-MCG tablet Take 1 tablet by mouth 2 (two) times daily. 60 tablet 2 Past Week   Cyanocobalamin (VITAMIN B-12 PO) Take 1 tablet by mouth daily.   Past Week   ferrous sulfate 325 (65 FE) MG tablet Take 325 mg by mouth 2 (two) times daily with a meal.   Past Week   lidocaine-prilocaine (EMLA) cream Apply on the port. 30 -45 min  prior to port access. 30 g 3 04/21/2022   Magnesium Cl-Calcium Carbonate (SLOW MAGNESIUM/CALCIUM) 70-117 MG TBEC Take 1 tablet by mouth 2 (two) times daily. 60 tablet 6 04/20/2022   Multiple Vitamin (MULTIVITAMIN WITH MINERALS) TABS tablet Take 1 tablet by mouth daily.   Past Week   mupirocin ointment (BACTROBAN) 2 % Apply 1 Application topically 2 (two) times daily. 30 g 0 04/20/2022   ondansetron (ZOFRAN-ODT) 8 MG disintegrating tablet Take 8 mg by mouth every 8 (eight) hours as needed for nausea or vomiting.   Past Week   potassium chloride (KLOR-CON) 20 MEQ packet Take 20 mEq by mouth 2 (two) times daily. (Patient taking differently: Take 20 mEq by mouth daily.) 60 packet 3 04/20/2022   diazepam (VALIUM) 2 MG tablet Take 1 tablet (2 mg total) by mouth every 12 (twelve) hours as needed for up to 20 doses for muscle spasms. (Patient not taking: Reported on 04/15/2022) 20 tablet 0    ondansetron (ZOFRAN) 4 MG tablet  Take 1 tablet (4 mg total) by mouth every 8 (eight) hours as needed for up to 20 doses for nausea or vomiting. (Patient not taking: Reported on 04/15/2022) 20 tablet 0    ONETOUCH VERIO test strip SMARTSIG:1 Each Via Meter Daily PRN      oxyCODONE (ROXICODONE) 5 MG immediate release tablet Take 1 tablet (5 mg total) by mouth every 8 (eight) hours as needed for up to 20 doses for severe pain. (Patient not taking: Reported on 04/15/2022) 20 tablet 0    prochlorperazine (COMPAZINE) 10 MG tablet Take 1 tablet (10 mg total) by mouth every 6 (six) hours as needed for nausea or vomiting. (Patient not taking: Reported on 04/15/2022) 40 tablet 1    Allergies  Allergen Reactions   Metformin Other (See Comments)   Past Medical History:  Diagnosis Date   Anemia    Carcinoma of upper-outer quadrant of right breast in female, estrogen receptor positive ([place]) 10/26/2021   Diabetes mellitus without complication (HCC)    Family history of adverse reaction to anesthesia    grandmother had issues but doesnt know what the issue was, she was older.   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 Axilla Butterfly hydro clip-MACRO METASTATIC MAMMARY CARCINOMA   CESAREAN SECTION  2005/2008   DILATION AND CURETTAGE OF UTERUS  2003   PORTACATH PLACEMENT Left 11/09/2021   Procedure: INSERTION PORT-A-CATH;  Surgeon: Robert Bellow, MD;  Location: [facility] ORS;  Service: General;  Laterality: Left;   SKIN BIOPSY Right 11/09/2021   Procedure: PUNCH BIOPSY SKIN, TATTOO LYMPH NODE RIGHT AXILLA;  Surgeon: Robert Bellow, MD;  Location: [facility] ORS;  Service: General;  Laterality: Right;   WISDOM TOOTH EXTRACTION     Social History   Socioeconomic History   Marital status:  Married    Spouse name: Jaci Standard   Number of children: 2   Years of education: Not on file   Highest education level: Not on file  Occupational History   Occupation: and Optometrist  Tobacco Use   Smoking status: Never   Smokeless tobacco: Never  Vaping Use   Vaping Use: Never used  Substance and Sexual Activity   Alcohol use: Never   Drug use: Never   Sexual activity: Yes    Birth control/protection: Other-see comments, Surgical    Comment: vasectomy  Other Topics Concern   Not on file  Social History Narrative   Pre-school teacher; in [place]; 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      ROS: Negative.     PE: HEENT: Negative. Lungs: Clear. Cardio: RR.   Assessment/Plan:  Proceed with planned mastectomy, SLN biopsy, possible axillary dissection, immediate reconstruction (Dr. Marla Roe).   Forest Gleason [facility] 04/21/2022

## 2022-04-21 NOTE — Op Note (Signed)
Op report    DATE OF OPERATION:  04/21/2022  LOCATION: [facility]  SURGICAL DIVISION: Plastic Surgery  PREOPERATIVE DIAGNOSES:  1. Right Breast cancer. Upper outer quadrant estrogen positive   POSTOPERATIVE DIAGNOSES:  1. Right Breast cancer.   PROCEDURE:  1. Right immediate breast reconstruction with placement of Acellular Dermal Matrix and tissue expanders.  SURGEON: Culver Feighner Sanger Jaena Brocato, DO  ANESTHESIA:  General.   COMPLICATIONS: None.   IMPLANTS: Right - [place]or 535 cc. Ref #[id number].  Serial Number [id number], 150 cc of injectable saline placed in the expander. Acellular Dermal Matrix 8 x 16 cm Flex HD  INDICATIONS FOR PROCEDURE:  The patient, Marilyn Page, is a 46 y.o. female born on 1975/06/21, is here for  immediate first stage breast reconstruction with placement of a right tissue expander and Acellular dermal matrix. MRN: [id number]  CONSENT:  Informed consent was obtained directly from the patient. Risks, benefits and alternatives were fully discussed. Specific risks including but not limited to bleeding, infection, hematoma, seroma, scarring, pain, implant infection, implant extrusion, capsular contracture, asymmetry, wound healing problems, and need for further surgery were all discussed. The patient did have an ample opportunity to have her questions answered to her satisfaction.   DESCRIPTION OF PROCEDURE:  The patient was taken to the operating room by the general surgery team. SCDs were placed and IV antibiotics were given. The patient's chest was prepped and draped in a sterile fashion. A time out was performed and the implants to be used were identified.  A right mastectomy were performed.  Once the general surgery team had completed their portion of the case the patient was rendered to the plastic and reconstructive surgery team.  Right:  The pectoralis major muscle was lifted from the chest wall with release of the lateral edge and  lateral inframammary fold.  The pocket was irrigated with antibiotic solution and hemostasis was achieved with electrocautery.  The ADM was then prepared according to the manufacture guidelines and slits placed to help with postoperative fluid management.  The ADM was then sutured to the inferior and lateral edge of the inframammary fold with 2-0 PDS starting with an interrupted stitch and then a running stitch.  The lateral portion was sutured to with interrupted sutures after the expander was placed.  The expander was prepared according to the manufacture guidelines, the air evacuated and then it was placed under the ADM and pectoralis major muscle.  The inferior and lateral tabs were used to secure the expander to the chest wall with 2-0 PDS.  The drain was placed at the inframammary fold over the ADM and secured to the skin with 3-0 Silk.    The deep layers were closed with 3-0 Monocryl followed by 4-0 Monocryl.  The skin was closed with 5-0 Monocryl and then dermabond was applied.  The ABDs and breast binder were placed.  The patient tolerated the procedure well and there were no complications.  The patient was allowed to wake from anesthesia and taken to the recovery room in satisfactory condition.

## 2022-04-21 NOTE — Discharge Instructions (Addendum)
INSTRUCTIONS FOR AFTER BREAST SURGERY   You will likely have some questions about what to expect following your operation.  The following information will help you and your family understand what to expect when you are discharged from the hospital.  It is important to follow these guidelines to help ensure a smooth recovery and reduce complication.  Postoperative instructions include information on: diet, wound care, medications and physical activity.  AFTER SURGERY Expect to go home after the procedure.  In some cases, you may need to spend one night in the hospital for observation.  DIET Breast surgery does not require a specific diet.  However, the healthier you eat the better your body will heal. It is important to increasing your protein intake.  This means limiting the foods with sugar and carbohydrates.  Focus on vegetables and some meat.  If you have liposuction during your procedure be sure to drink water.  If your urine is bright yellow, then it is concentrated, and you need to drink more water.  As a general rule after surgery, you should have 8 ounces of water every hour while awake.  If you find you are persistently nauseated or unable to take in liquids let us know.  NO TOBACCO USE or EXPOSURE.  This will slow your healing process and lead to a wound.  WOUND CARE Leave the binder on for 3 days . Use fragrance free soap like Dial, Dove or [place].   After 3 days you can remove the binder to shower. Once dry apply binder or sports bra. If you have liposuction you will have a soft and spongy dressing (Lipofoam) that helps prevent creases in your skin.  Remove before you shower and then replace it.  It is also available on [employer]. If you have steri-strips / tape directly attached to your skin leave them in place. It is OK to get these wet.   No baths, pools or hot tubs for four weeks. We close your incision to leave the smallest and best-looking scar. No ointment or creams on your incisions  for four weeks.  No Neosporin (Too many skin reactions).  A few weeks after surgery you can use Mederma and start massaging the scar. We ask you to wear your binder or sports bra for the first 6 weeks around the clock, including while sleeping. This provides added comfort and helps reduce the fluid accumulation at the surgery site. No Ice or heating pads to the operative site.  You have a very high risk of a burn before you feel the temperature change.  ACTIVITY No heavy lifting until cleared by the doctor.  This usually means no more than a half-gallon of milk.  It is OK to walk and climb stairs. Moving your legs is very important to decrease your risk of a blood clot.  It will also help keep you from getting deconditioned.  Every 1 to 2 hours get up and walk for 5 minutes. This will help with a quicker recovery back to normal.  Let pain be your guide so you don't do too much.  This time is for you to recover.  You will be more comfortable if you sleep and rest with your head elevated either with a few pillows under you or in a recliner.  No stomach sleeping for a three months.  WORK Everyone returns to work at different times. As a rough guide, most people take at least 1 - 2 weeks off prior to returning to work. If  you need documentation for your job, give the forms to the front staff at the clinic.  DRIVING Arrange for someone to bring you home from the hospital after your surgery.  You may be able to drive a few days after surgery but not while taking any narcotics or valium.  BOWEL MOVEMENTS Constipation can occur after anesthesia and while taking pain medication.  It is important to stay ahead for your comfort.  We recommend taking Milk of Magnesia (2 tablespoons; twice a day) while taking the pain pills.  MEDICATIONS You may be prescribed should start after surgery At your preoperative visit for you history and physical you may have been given the following medications: An antibiotic: Start  this medication when you get home and take according to the instructions on the bottle. Zofran 4 mg:  This is to treat nausea and vomiting.  You can take this every 6 hours as needed and only if needed. Valium 2 mg for breast cancer patients: This is for muscle tightness if you have an implant or expander. This will help relax your muscle which also helps with pain control.  This can be taken every 12 hours as needed. Don't drive after taking this medication. Norco (hydrocodone/acetaminophen) 5/325 mg:  This is only to be used after you have taken the Motrin or the Tylenol. Every 8 hours as needed.   Over the counter Medication to take: Ibuprofen (Motrin) 600 mg:  Take this every 6 hours.  If you have additional pain then take 500 mg of the Tylenol every 8 hours.  Only take the Norco after you have tried these two. MiraLAX or Milk of Magnesia: Take this according to the bottle if you take the [place] Call your surgeon's office if any of the following occur: Fever 101 degrees F or greater Excessive bleeding or fluid from the incision site. Pain that increases over time without aid from the medications Redness, warmth, or pus draining from incision sites Persistent nausea or inability to take in liquids Severe misshapen area that underwent the operation.  Here are some resources for breast cancer patients:  Plastic surgery website: https://www.plasticsurgery.org/for-medical-professionals/education-and-resources/publications/breast-reconstruction-magazine Breast Reconstruction Awareness Campaign:  [employer] Plastic surgery Implant information:  https://www.plasticsurgery.org/patient-safety/breast-implant-safety  AMBULATORY SURGERY  DISCHARGE INSTRUCTIONS   The drugs that you were given will stay in your system until tomorrow so for the next 24 hours you should not:  Drive an automobile Make any legal decisions Drink any alcoholic beverage   You may  resume regular meals tomorrow.  Today it is better to start with liquids and gradually work up to solid foods.  You may eat anything you prefer, but it is better to start with liquids, then soup and crackers, and gradually work up to solid foods.   Please notify your doctor immediately if you have any unusual bleeding, trouble breathing, redness and pain at the surgery site, drainage, fever, or pain not relieved by medication.     Please call to schedule your post-operative visit.  Additional Instructions:

## 2022-04-21 NOTE — Op Note (Signed)
Preoperative diagnosis: Inflammatory cancer of the right breast, status post neoadjuvant chemotherapy, desire for breast reconstruction.  Postoperative diagnosis: Same.  Operative procedure: Right simple mastectomy with sentinel node biopsy x2; expander placement.  Operating surgeon: Hervey Ard, MD; Audelia Hives, DO  Anesthesia: General endotracheal, paravertebral block.  Estimated blood loss: Less than 75 cc.  Clinical note: This 46 year old woman developed breast cancer with evidence clinical exam of inflammatory component.  She underwent neoadjuvant chemotherapy with reasonable response, especially with resolution of the previous identified skin nodules.  She desired breast conservation.  She underwent wire localization of a positive axillary node this morning as well as the injection with technetium sulfur colloid prior to presentation to the operating theater.  She received Ancef intravenously on induction of anesthesia.  SCD stockings for DVT prevention.  Operative note: With the patient under adequate general anesthesia periareolar skin was cleansed with alcohol and 4 cc of 0.5% methylene blue was instilled.  The breast was then cleansed with ChloraPrep and draped.  Elliptical incision was outlined.  The skin flaps were elevated to the clavicle superiorly, sternum medially serratus fascia laterally and the inframammary fold inferiorly.  The localizing wire was brought into the wound and the node which was transected by the wire was excised and sent fresh for touch prep.  This was negative.  Scanning through the axilla there were multiple areas with vague increased uptake but no definable nodal tissue.  There was one firm node at the mid level of the axilla that was moderately "hot".  This was also negative on touch prep.  At this point with no other clearly palpable nodal tissue and inconclusive technetium scanning, it was elected to defer further axillary dissection pending permanent  section.  The breast was elevated off the underlying pectoralis muscle which was taken with the specimen.  The specimen weighed 703 g.  Good hemostasis was noted.  At this point plastic surgery underwent placement of a retropectoral expander.  This will be dictated separately.  Patient tolerated procedure well was taken to PACU in stable condition.

## 2022-04-21 NOTE — Interval H&P Note (Signed)
History and Physical Interval Note:  04/21/2022 1:35 PM  Marilyn Page  has presented today for surgery, with the diagnosis of right breast cancer.  The various methods of treatment have been discussed with the patient and family. After consideration of risks, benefits and other options for treatment, the patient has consented to  Procedure(s) with comments: [place] (Right) - wire localization of lymph node RIGHT BREAST RECONSTRUCTION WITH PLACEMENT OF TISSUE EXPANDER AND FLEX HD (ACELLULAR HYDRATED DERMIS) (Right) as a surgical intervention.  The patient's history has been reviewed, patient examined, no change in status, stable for surgery.  I have reviewed the patient's chart and labs.  Questions were answered to the patient's satisfaction.     Marilyn Page

## 2022-04-21 NOTE — Interval H&P Note (Signed)
History and Physical Interval Note:  04/21/2022 1:35 PM  Marilyn Page  has presented today for surgery, with the diagnosis of right breast cancer.  The various methods of treatment have been discussed with the patient and family. After consideration of risks, benefits and other options for treatment, the patient has consented to  Procedure(s) with comments: [name] (Right) - wire localization of lymph node RIGHT BREAST RECONSTRUCTION WITH PLACEMENT OF TISSUE EXPANDER AND FLEX HD (ACELLULAR HYDRATED DERMIS) (Right) as a surgical intervention.  The patient's history has been reviewed, patient examined, no change in status, stable for surgery.  I have reviewed the patient's chart and labs.  Questions were answered to the patient's satisfaction.     Loel Lofty Gwynn Chalker

## 2022-04-21 NOTE — Anesthesia Preprocedure Evaluation (Signed)
Anesthesia Evaluation  Patient identified by MRN, date of birth, ID band Patient awake    Reviewed: Allergy & Precautions, NPO status , Patient's Chart, lab work & pertinent test results  History of Anesthesia Complications Negative for: history of anesthetic complications  Airway Mallampati: II  TM Distance: >3 FB Neck ROM: Full    Dental no notable dental hx. (+) Teeth Intact   Pulmonary neg pulmonary ROS, neg sleep apnea, neg COPD, Patient abstained from smoking.Not current smoker   Pulmonary exam normal breath sounds clear to auscultation       Cardiovascular Exercise Tolerance: Good METS(-) hypertension(-) CAD and (-) Past MI negative cardio ROS (-) dysrhythmias  Rhythm:Regular Rate:Normal - Systolic murmurs    Neuro/Psych negative neurological ROS  negative psych ROS   GI/Hepatic ,neg GERD  ,,(+)     (-) substance abuse    Endo/Other  diabetes    Renal/GU negative Renal ROS     Musculoskeletal   Abdominal  (+) + obese  Peds  Hematology   Anesthesia Other Findings Past Medical History: No date: Anemia 10/26/2021: Carcinoma of upper-outer quadrant of right breast in  female, estrogen receptor positive ([place]) No date: Diabetes mellitus without complication ([facility]) No date: Family history of adverse reaction to anesthesia     Comment:  grandmother had issues but doesnt know what the issue               was, she was older. 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 No date: Personal history of chemotherapy  Reproductive/Obstetrics                              Anesthesia Physical Anesthesia Plan  ASA: 3  Anesthesia Plan: General   Post-op Pain Management: Regional block* and Ofirmev IV (intra-op)*   Induction: Intravenous  PONV Risk Score and Plan: 4 or greater and Ondansetron, Dexamethasone  and Midazolam  Airway Management Planned: Oral ETT and Video Laryngoscope Planned  Additional Equipment: None  Intra-op Plan:   Post-operative Plan: Extubation in OR  Informed Consent: I have reviewed the patients History and Physical, chart, labs and discussed the procedure including the risks, benefits and alternatives for the proposed anesthesia with the patient or authorized representative who has indicated his/her understanding and acceptance.     Dental advisory given  Plan Discussed with: CRNA and Surgeon  Anesthesia Plan Comments: (Discussed risks of anesthesia with patient, including PONV, sore throat, lip/dental/eye damage. Rare risks discussed as well, such as cardiorespiratory and neurological sequelae, and allergic reactions. Discussed the role of CRNA in patient's perioperative care. Patient understands.  Discussed with patient the r/b/a of Erector Spinae Plane (ESP) block, including: - Bleeding - Infection - Damage to surrounding structures such as nerves and blood vessels - Pneumothorax - Reactions to injectate - Poor or non-functioning block.  Patient understands and wishes to receive the block. )         Anesthesia Quick Evaluation

## 2022-04-21 NOTE — Transfer of Care (Signed)
Immediate Anesthesia Transfer of Care Note  Patient: BRYSTOL WASILEWSKI  Procedure(s) Performed: SIMPLE MASTECTOMY WITH AXILLARY SENTINEL NODE BIOPSY (Right: Breast) RIGHT BREAST RECONSTRUCTION WITH PLACEMENT OF TISSUE EXPANDER AND FLEX HD (ACELLULAR HYDRATED DERMIS) (Right)  Patient Location: PACU  Anesthesia Type:General  Level of Consciousness: drowsy and patient cooperative  Airway & Oxygen Therapy: Patient Spontanous Breathing and Patient connected to nasal cannula oxygen  Post-op Assessment: Report given to RN and Post -op Vital signs reviewed and stable  Post vital signs: Reviewed and stable  Last Vitals:  Vitals Value Taken Time  BP 129/58 04/21/22 1709  Temp    Pulse 99 04/21/22 1710  Resp 12 04/21/22 1710  SpO2 98 % 04/21/22 1710  Vitals shown include unvalidated device data.  Last Pain:  Vitals:   04/21/22 1235  TempSrc: Temporal  PainSc: 0-No pain         Complications: No notable events documented.

## 2022-04-21 NOTE — Anesthesia Procedure Notes (Addendum)
Procedure Name: Intubation Date/Time: 04/21/2022 1:39 PM  Performed by: Kelton Pillar, CRNAPre-anesthesia Checklist: Patient identified, Emergency Drugs available, Suction available and Patient being monitored Patient Re-evaluated:Patient Re-evaluated prior to induction Oxygen Delivery Method: Circle system utilized Preoxygenation: Pre-oxygenation with 100% oxygen Induction Type: IV induction Ventilation: Mask ventilation without difficulty Laryngoscope Size: McGraph and 3 Grade View: Grade I Tube type: Oral Tube size: 6.5 mm Number of attempts: 1 Airway Equipment and Method: Stylet and Oral airway Placement Confirmation: ETT inserted through vocal cords under direct vision, positive ETCO2, breath sounds checked- equal and bilateral and CO2 detector Secured at: 21 cm Tube secured with: Tape Dental Injury: Teeth and Oropharynx as per pre-operative assessment

## 2022-04-21 NOTE — Anesthesia Procedure Notes (Signed)
Anesthesia Regional Block: Other (Erector Spinae Plane block)   Pre-Anesthetic Checklist: , timeout performed,  Correct Patient, Correct Site, Correct Laterality,  Correct Procedure, Correct Position, site marked,  Risks and benefits discussed,  Surgical consent,  Pre-op evaluation,  At surgeon's request and post-op pain management  Laterality: Right  Prep: Maximum Sterile Barrier Precautions used, chloraprep       Needles:  Injection technique: Single-shot  Needle Type: Stimiplex      Needle Gauge: 21     Additional Needles:   Procedures:,,,, ultrasound used (permanent image in chart),,    Narrative:  Injection made incrementally with aspirations every 5 mL.  Performed by: Personally  Anesthesiologist: Arita Miss, MD  Additional Notes: Patient's chart reviewed and they were deemed appropriate candidate for procedure, per surgeon's request. Patient educated about risks, benefits, and alternatives of the block including but not limited to: temporary or permanent nerve damage, bleeding, infection, damage to surround tissues, pneumothorax, block failure, local anesthetic toxicity. Patient expressed understanding. A formal time-out was conducted consistent with institution rules.  Monitors were applied, and minimal sedation used (see nursing record). The site was prepped with skin prep and allowed to dry, and sterile gloves were used. A high frequency linear ultrasound probe with probe cover was utilized throughout. Ribs visualized on ultrasound, and third rib located. Probe moved medially to identify corresponding transverse process. Image appeared anatomically normal. Needle trajectory visualized throughout. Contact of the needle made with edge of transverse process. Aspiration performed every 15m and was negative. Blood vessels and pleura were avoided. Erector spinae plane visualized hydrodissecting both superiorly and inferiorly. All injections were performed without resistance and  free of blood and paresthesias. The patient tolerated the procedure well.  Injectate: 235mExparel + 1032m.5% bupivacaine

## 2022-04-21 NOTE — Anesthesia Postprocedure Evaluation (Signed)
Anesthesia Post Note  Patient: Marilyn Page  Procedure(s) Performed: SIMPLE MASTECTOMY WITH AXILLARY SENTINEL NODE BIOPSY (Right: Breast) RIGHT BREAST RECONSTRUCTION WITH PLACEMENT OF TISSUE EXPANDER AND FLEX HD (ACELLULAR HYDRATED DERMIS) (Right)  Patient location during evaluation: PACU Anesthesia Type: General Level of consciousness: awake and alert, oriented and patient cooperative Pain management: pain level controlled Vital Signs Assessment: post-procedure vital signs reviewed and stable Respiratory status: spontaneous breathing, nonlabored ventilation and respiratory function stable Cardiovascular status: blood pressure returned to baseline and stable Postop Assessment: adequate PO intake Anesthetic complications: no   No notable events documented.   Last Vitals:  Vitals:   04/21/22 1730 04/21/22 1758  BP: 126/70 130/69  Pulse: 97 96  Resp: 20 15  Temp:  36.7 C  SpO2: 95% 95%    Last Pain:  Vitals:   04/21/22 1758  TempSrc: Temporal  PainSc: [place]

## 2022-04-22 ENCOUNTER — Other Ambulatory Visit: Payer: Self-pay | Admitting: Anatomic Pathology & Clinical Pathology

## 2022-04-22 ENCOUNTER — Ambulatory Visit
Admission: RE | Admit: 2022-04-22 | Discharge: 2022-04-22 | Disposition: A | Discharge status: Home / Self Care | Payer: 59 | Source: Ambulatory Visit | Attending: Anatomic Pathology & Clinical Pathology | Admitting: Anatomic Pathology & Clinical Pathology

## 2022-04-22 ENCOUNTER — Ambulatory Visit (INDEPENDENT_AMBULATORY_CARE_PROVIDER_SITE_OTHER): Payer: 59 | Admitting: Physician Assistant

## 2022-04-22 ENCOUNTER — Encounter: Payer: Self-pay | Admitting: General Surgery

## 2022-04-22 VITALS — BP 99/60 | HR 74

## 2022-04-22 DIAGNOSIS — M795 Residual foreign body in soft tissue: Secondary | ICD-10-CM

## 2022-04-22 DIAGNOSIS — C50411 Malignant neoplasm of upper-outer quadrant of right female breast: Secondary | ICD-10-CM

## 2022-04-22 DIAGNOSIS — Z170 Estrogen receptor positive status [ER+]: Secondary | ICD-10-CM

## 2022-04-22 NOTE — Progress Notes (Signed)
  Is a 46 year old female who is status post right immediate breast reconstruction with placement of acellular dermal matrix and tissue expander by Dr. Marla Roe yesterday 04/21/2022.  The patient notes she was doing well.  She woke up without any significant issues.  She notes that later on in the morning she developed some swelling above her binder as well as some lightheadedness and feeling unwell.  She denies any fevers or vomiting.  Her husband notes that he emptied the bulb of approximately 40 cc this morning at 7 AM, again he emptied approximately 50 cc after, and she had approximately 100 cc upon arrival to our office.  Chaperone present.  On exam there is some swelling above the right breast, her incisions are covered in dressings, no surrounding redness or warmth, she has some ecchymosis along the right lateral breast.  The JP drain with bloody output.  Today's Vitals   04/22/22 1437 04/22/22 1439 04/22/22 1440 04/22/22 1520  BP: (!) 86/61 (!) 111/50 (!) 106/54 99/60  Pulse: 92 69 77 74  SpO2: 99% 97% 98% 98%   There is no height or weight on file to calculate BMI.   The patient presented to our office for evaluation today.  She did have a hematoma that appeared to be draining in the JP bulb.  Dr. Marla Roe did install approximately 30 cc of injectable saline into the expander, and we allowed the remaining blood to drain from the JP drain.  The patient was watched in our office for several hours.  The initial blood pressure was in the 37T systolically but we repeated it immediately with reading within her normal range, this was repeated several times throughout her visit with again normotensive readings.  She was ambulated and felt well with no dizziness, or lightheadedness.  Dr. Marla Roe had the opportunity to personally evaluate the patient.  At this time she does feel comfortable with continued close monitoring.  Both the patient and her husband are comfortable with this as well, they  will reach out to our office if she has continued JP drain output, they will present to the emergency room immediately if they have any worsening signs or symptoms.  Both the patient and the husband verbalized understanding and agreement to today's plan had no further questions or concerns.

## 2022-04-23 ENCOUNTER — Other Ambulatory Visit: Payer: Self-pay | Admitting: Anatomic Pathology & Clinical Pathology

## 2022-04-23 ENCOUNTER — Encounter: Payer: Self-pay | Admitting: Physician Assistant

## 2022-04-23 ENCOUNTER — Telehealth: Payer: Self-pay | Admitting: Physician Assistant

## 2022-04-23 ENCOUNTER — Ambulatory Visit (INDEPENDENT_AMBULATORY_CARE_PROVIDER_SITE_OTHER): Payer: 59 | Admitting: Physician Assistant

## 2022-04-23 VITALS — BP 116/72 | HR 87

## 2022-04-23 DIAGNOSIS — C50411 Malignant neoplasm of upper-outer quadrant of right female breast: Secondary | ICD-10-CM

## 2022-04-23 DIAGNOSIS — Z170 Estrogen receptor positive status [ER+]: Secondary | ICD-10-CM

## 2022-04-23 LAB — SURGICAL PATHOLOGY

## 2022-04-23 NOTE — Telephone Encounter (Signed)
I spoke withMrs. Bryds husband today, he notes that she is doing well and that the drain has consistently put out less output.  He notes the most output she had overnight was 50 cc over 2 hours.  The blood has gotten darker.  They are comfortable with continued close monitoring.  I will reach out to them later this afternoon to recheck.  They understand to call immediately if they develop any new or worsening signs or symptoms.

## 2022-04-23 NOTE — Progress Notes (Signed)
This is a 46 year old female who is status post right immediate breast reconstruction with placement of acellular dermal matrix and tissue expander by Dr. Marla Roe yesterday 04/21/2022.  The patient notes she was doing well.  She woke up without any significant issues.  She notes that later on in the morning she developed some swelling above her binder as well as some lightheadedness and feeling unwell.  She denies any fevers or vomiting.  Her husband notes that he emptied the bulb of approximately 40 cc this morning at 7 AM, again he emptied approximately 50 cc after, and she had approximately 100 cc upon arrival to our office   This is a 46 year old female who is status post right immediate breast reconstruction with placement of acellular dermal matrix and tissue expander by Dr. Marla Roe on 04/21/2022.  The patient was doing well, she woke up yesterday with some swelling along the right breast.  She was subsequently seen in our office to have some hematoma that was drained in the office and had consistently had some ongoing drainage.  She was followed closely overnight, she notes since that time the drainage has steadily decreased.  She notes it has become darker in nature.  She notes that compared to yesterday in which she felt lightheaded and weak prior to our evaluation she is no longer had any episodes of dizziness, lightheadedness, headache.  She has some tenderness along the breast predominantly along the right lateral aspect.  She denies any infectious signs or symptoms including fever chills.  She has been wearing her compressive binder. She notes output since midnight to be 60ccs.   Chaperone present.  On exam the right breast swelling has decreased when compared to yesterday, there is still some swelling but is much softer when compared to yesterday, the mastectomy flaps are viable with good cap refill.,  Ecchymosis noted along predominantly the lateral breast and axilla.  The JP drain with  dark bloody output.  The patient has improved when compared to yesterday.  Again she has not had any lightheadedness or dizziness, her drain output has significantly improved.  Dr. Marla Roe had the opportunity to personally evaluate the patient.  We will continue close monitoring.  The patient will reach out to [place] if she has any concerns whatsoever.  We did specifically advised her to reach out to [place] immediately if she had any increased in her drain output or develops any lightheaded, dizziness, headaches, or feeling weak.  The patient and her husband verbalized understanding and agreement to today's plan.  We will follow-up on Monday or sooner as needed.  They had no further questions or concerns.

## 2022-04-26 ENCOUNTER — Encounter: Payer: Self-pay | Admitting: *Deleted

## 2022-04-26 NOTE — Progress Notes (Signed)
Patient doing well since surgery.   Follow up appt. For Dr. Jacinto Reap scheduled for 11/30.

## 2022-04-28 ENCOUNTER — Ambulatory Visit (INDEPENDENT_AMBULATORY_CARE_PROVIDER_SITE_OTHER): Payer: 59 | Admitting: Student

## 2022-04-28 ENCOUNTER — Encounter: Payer: 59 | Admitting: Student

## 2022-04-28 ENCOUNTER — Encounter: Payer: Self-pay | Admitting: Student

## 2022-04-28 VITALS — BP 115/74 | HR 94

## 2022-04-28 DIAGNOSIS — C50411 Malignant neoplasm of upper-outer quadrant of right female breast: Secondary | ICD-10-CM

## 2022-04-28 DIAGNOSIS — Z170 Estrogen receptor positive status [ER+]: Secondary | ICD-10-CM

## 2022-04-28 NOTE — Progress Notes (Signed)
Patient is a 46 year old female who underwent right simple mastectomy with sentinel node biopsy with Dr. Bary Castilla followed by right immediate breast reconstruction with placement of acellular dermal matrix and tissue expander to the right breast with Dr. Marla Roe on 04/21/2022.  Intraoperatively, patient had a Mentor 535 cc expanders placed.  150 cc of injectable saline were placed in the expander in the operating room.  Patient presents to the clinic today for postoperative follow-up.  Patient was recently seen in the clinic on 04/22/2022.  At this visit, she was postop day 1.  She reported she was doing well, but noted that later that morning she developed some swelling above her binder as well as some lightheadedness and feeling unwell.  There was swelling noted on exam, there was swelling noted to her right breast, and there was some ecchymosis noted along the right lateral breast.  The JP drain had bloody output.  She appeared to have a hematoma that was draining into the JP bulb.  30 cc of injectable saline were placed into the expander.  The patient was watched in the office for several hours.  The decision was made to continue close monitoring.    Patient was then seen again in the clinic on 04/23/2022.  At this visit, she reported that her drainage had steadily decreased and the output had become darker in nature.  She denied any episodes of dizziness, lightheadedness or headaches at this visit.  She had put out approximately 60 cc of drainage from midnight to her visit.  On exam, the right breast swelling had decreased in comparison to the day before.  There is ecchymosis noted along the lateral breast and axilla.  The JP drain was noted to have dark bloody output.  Plan was made to continue close monitoring.   Patient is accompanied by her husband at bedside.  Today, patient reports she is doing well.  She states she is feeling much better than last week.  She states that she sometimes has a little  bit of pain, but has been taking Tylenol and sometimes a muscle relaxer.  She reports that there is been approximately between 50 and 60 cc coming out of her drain daily.  Patient states that the swelling has significantly improved since last week, but her right breast is still little swollen.  Patient denies any fevers or chills.  She denies any other issues or complaints.  Chaperone present on exam.  On exam, patient is sitting upright in no acute distress.  Right breast expander is in place.  There is some swelling noted to the right breast.  There is some overlying ecchymosis noted.  There is no overlying erythema.  There appears to be a little bit of fluid upon palpation underneath the skin.  Honeycomb dressing overlying the incision is clean dry and intact.  This was removed.  Incision is intact with Steri-Strips.  JP drain is in place and functioning.  There is dark serosanguineous drainage in the bulb.  At today's visit, we attempted to massage some of the fluid out to the drain and help break up some of the fluid.  Approximately 10-15 or so cc of dark serosanguineous drainage went into the bulb.   I discussed with the patient that given she is still little bit swollen, we will hold off on an expander fill today.  We did attempt aspiration over the port site to avoid injury to the expander.  We were unable to aspirate any significant amount of fluid.  I discussed with the patient that she should continue compression at all times.  I discussed that she can gently massage the area to her right breast to help facilitate the fluid moving out.  Patient expressed understanding.  I discussed with the patient that we can plan to do another expander fill next week.  Patient to follow-up next week for reevaluation and possible expander fill.  I instructed the patient to call in the meantime if she has any questions or concerns.

## 2022-05-06 ENCOUNTER — Other Ambulatory Visit: Payer: Self-pay | Admitting: Internal Medicine

## 2022-05-06 ENCOUNTER — Encounter: Payer: Self-pay | Admitting: Internal Medicine

## 2022-05-06 ENCOUNTER — Ambulatory Visit (INDEPENDENT_AMBULATORY_CARE_PROVIDER_SITE_OTHER): Payer: 59 | Admitting: Student

## 2022-05-06 ENCOUNTER — Encounter: Payer: Self-pay | Admitting: Pharmacy Technician

## 2022-05-06 ENCOUNTER — Inpatient Hospital Stay (HOSPITAL_BASED_OUTPATIENT_CLINIC_OR_DEPARTMENT_OTHER): Payer: 59 | Admitting: Internal Medicine

## 2022-05-06 VITALS — BP 119/80 | HR 94 | Temp 99.2°F | Resp 16 | Wt 217.2 lb

## 2022-05-06 VITALS — BP 115/73 | HR 80

## 2022-05-06 DIAGNOSIS — Z170 Estrogen receptor positive status [ER+]: Secondary | ICD-10-CM | POA: Diagnosis not present

## 2022-05-06 DIAGNOSIS — C50411 Malignant neoplasm of upper-outer quadrant of right female breast: Secondary | ICD-10-CM

## 2022-05-06 DIAGNOSIS — C50211 Malignant neoplasm of upper-inner quadrant of right female breast: Secondary | ICD-10-CM | POA: Diagnosis not present

## 2022-05-06 NOTE — Progress Notes (Signed)
Marilyn Page NOTE  Patient Care Team: Latanya Maudlin, NP as PCP - General (Family Medicine) Daiva Huge, RN as Oncology Nurse Navigator Cammie Sickle, [name]  CHIEF COMPLAINTS/PURPOSE OF CONSULTATION: Breast cancer  Oncology History Overview Note  MAY 2023-    Targeted ultrasound is performed, showing a 2.9 x 1.9 x 2.0 cm irregular hypoechoic mass right breast 10 o'clock position 6 cm from nipple at the site of palpable concern.   There is an abnormal 8 mm thickened right axillary lymph node.   There is skin thickening overlying the right breast. ------------------  A. BREAST, RIGHT AT 10:00, 6 CM FROM THE NIPPLE; ULTRASOUND-GUIDED CORE  NEEDLE BIOPSY:  - INVASIVE MAMMARY CARCINOMA, NO SPECIAL TYPE.   Size of invasive carcinoma: 11 mm in this sample  Histologic grade of invasive carcinoma: Grade 2                       Glandular/tubular differentiation score: 3                       Nuclear pleomorphism score: 2                       Mitotic rate score: 1                       Total score: 6  Ductal carcinoma in situ: Present, intermediate grade  Lymphovascular invasion: Not identified   B. LYMPH NODE, RIGHT AXILLARY; ULTRASOUND-GUIDED CORE NEEDLE BIOPSY:  - 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 IN GREATEST  EXTENT.   Comment:  The malignancy in the primary breast lesion appears morphologically  different from tumor in the lymph node sampling, with tumor present in  lymph node more suggestive of a histologic grade 3, with significantly  increased mitotic activity and pleomorphism. Due to the discrepancy in  these morphologic patterns, ER/PR/HER2 immunohistochemistry will be  performed on both blocks A1 and B1, with reflex to [place] for HER2 2+. The  results will be reported in an addendum.   ADDENDUM:  CASE SUMMARY: BREAST BIOMARKER TESTS - A - BREAST, RIGHT AT 10:00  Estrogen Receptor  (ER) Status: POSITIVE          Percentage of cells with nuclear positivity: 71-80%          Average intensity of staining: Strong   Progesterone Receptor (PgR) Status: POSITIVE          Percentage of cells with nuclear positivity: 81-90%          Average intensity of staining: Strong   HER2 (by immunohistochemistry): POSITIVE (Score 3+)       Percentage of cells with uniform intense complete membrane  staining: 61-70%  HER2 displays a heterogeneous staining pattern, with areas showing a  strong 3+ staining pattern, and other regions with complete absence (0+)  of staining.   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  Discussed with Dr. Sedalia Muta tumor heterogeneity. BREAST MRI- JUNE 2023- The patient's known malignancy in the upper outer quadrant of the right breast measures 3.8 x 2.8 x 3.6 cm. Numerous other suspicious masses are identified in the right breast as above involving the upper outer and upper inner quadrants, located both anterior and posterior to the known malignancy.  Numerous enhancing foci in the skin as above are worrisome for skin metastases ; Bx proven. Biopsy proven metastatic node in the right axilla.  No evidence of malignancy in the left breast;  Two mildly prominent right internal mammary nodes were not FDG avid on recent PET-CT imaging.  Currently on neoadjuvant chemotherapy- TCH  plus P x6 cycles;  MUGA scan May 31st, 2023- -61%.    # June 7th, 2023- NEO-ADJUVANT CHEMO- TCH+P   # Genetic counseling s/p- NEG for any deleterious mutations.     Carcinoma of upper-outer quadrant of right breast in female, estrogen receptor positive ([place])  10/26/2021 Initial Diagnosis   Carcinoma of upper-outer quadrant of right breast in female, estrogen receptor positive ([place])   10/26/2021 Cancer Staging   Staging form: Breast, AJCC 8th Edition - Clinical: Stage IB (cT2, cN1, cM0, G2, ER+, PR+, HER2+) - Signed by Cammie Sickle, MD on 10/26/2021 Histologic grading system: 3 grade system   11/10/2021 - 03/01/2022 Chemotherapy   Patient is on Treatment Plan : BREAST  Docetaxel + Carboplatin + Trastuzumab + Pertuzumab  (TCHP) q21d      11/11/2021 - 01/13/2022 Chemotherapy   Patient is on Treatment Plan : BREAST  Docetaxel + Carboplatin + Trastuzumab + Pertuzumab  (TCHP) q21d       Genetic Testing   Negative genetic testing. No pathogenic variants identified on the [employer] CancerNext-Expanded+RNA panel. The report date is 11/29/2021.  The CancerNext-Expanded + RNAinsight gene panel offered by [employer] and includes sequencing and rearrangement analysis for the following 77 genes: IP, ALK, APC*, ATM*, AXIN2, BAP1, BARD1, BLM, BMPR1A, BRCA1*, BRCA2*, BRIP1*, CDC73, CDH1*,CDK4, CDKN1B, CDKN2A, CHEK2*, CTNNA1, DICER1, FANCC, FH, FLCN, GALNT12, KIF1B, LZTR1, MAX, MEN1, MET, MLH1*, MSH2*, MSH3, MSH6*, MUTYH*, NBN, NF1*, NF2, NTHL1, PALB2*, PHOX2B, PMS2*, POT1, PRKAR1A, PTCH1, PTEN*, RAD51C*, RAD51D*,RB1, RECQL, RET, SDHA, SDHAF2, SDHB, SDHC, SDHD, SMAD4, SMARCA4, SMARCB1, SMARCE1, STK11, SUFU, TMEM127, TP53*,TSC1, TSC2, VHL and XRCC2 (sequencing and deletion/duplication); EGFR, EGLN1, HOXB13, KIT, MITF, PDGFRA, POLD1 and POLE (sequencing only); EPCAM and GREM1 (deletion/duplication only).   05/20/2022 -  Chemotherapy   Patient is on Treatment Plan : BREAST ADO-Trastuzumab Emtansine ([place])  q21d       HISTORY OF PRESENTING ILLNESS: Patient is ambulating independently.  Accompanied by her husband.   Marilyn Page 46 y.o.  female right breast TRIPLE POSITIVE with T4- Stage III currently s/p neoadjuvant TCH plus P chemotherapy #6 -s/p mastectomy-with reconstruction surgery is here for follow-up/reviews of the pathology/treatment plan.  Patient is improved since surgery.  No postoperative complications...  Patient continues to complains of mild tingling and numbness in extremities.  Chronic mild fatigue. No skin rash.    Review of Systems  Constitutional:  Positive for malaise/fatigue. Negative for chills, diaphoresis, fever and weight loss.  HENT:  Negative for nosebleeds and sore throat.   Eyes:  Negative for double vision.  Respiratory:  Negative for  cough, hemoptysis, sputum production, shortness of breath and wheezing.   Cardiovascular:  Negative for chest pain, palpitations, orthopnea and leg swelling.  Gastrointestinal:  Negative for abdominal pain, blood in stool, constipation, diarrhea, heartburn, melena, nausea and vomiting.  Genitourinary:  Negative for dysuria, frequency and urgency.  Musculoskeletal:  Positive for myalgias. Negative for back pain and joint pain.  Skin: Negative.  Negative for itching and rash.  Neurological:  Negative for dizziness, tingling, focal weakness, weakness and headaches.  Endo/Heme/Allergies:  Does not bruise/bleed easily.  Psychiatric/Behavioral:  Negative for depression. The patient is not nervous/anxious and does not have insomnia.      MEDICAL HISTORY:  Past Medical History:  Diagnosis Date   Anemia    Carcinoma of upper-outer quadrant of right breast in female, estrogen receptor positive ([place]) 10/26/2021   Diabetes mellitus without complication (HCC)    Family history of adverse reaction to anesthesia    grandmother had issues but doesnt know what the issue was, she was older.   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 Axilla Butterfly hydro clip-MACRO METASTATIC MAMMARY CARCINOMA   BREAST BIOPSY Right 04/21/2022   [place] RT PLC BREAST LOC DEV   1ST LESION  INC US GUIDE 04/21/2022 [facility]   BREAST RECONSTRUCTION WITH PLACEMENT OF TISSUE EXPANDER AND FLEX HD (ACELLULAR HYDRATED DERMIS) Right 04/21/2022   Procedure: RIGHT BREAST RECONSTRUCTION WITH PLACEMENT OF TISSUE EXPANDER AND FLEX HD (ACELLULAR HYDRATED DERMIS);  Surgeon: Wallace Going, DO;  Location: [facility];  Service: Plastics;  Laterality: Right;   CESAREAN SECTION  2005/2008   DILATION AND CURETTAGE OF UTERUS  2003   PORTACATH PLACEMENT Left 11/09/2021   Procedure: INSERTION PORT-A-CATH;  Surgeon: Robert Bellow, MD;  Location: [facility];  Service: General;  Laterality: Left;   SIMPLE MASTECTOMY WITH AXILLARY SENTINEL NODE BIOPSY Right 04/21/2022   Procedure: SIMPLE MASTECTOMY WITH AXILLARY SENTINEL NODE BIOPSY;  Surgeon: Robert Bellow, MD;  Location: [facility];  Service: General;  Laterality: Right;  wire localization of lymph node   SKIN BIOPSY Right 11/09/2021   Procedure: PUNCH BIOPSY SKIN, TATTOO LYMPH NODE RIGHT AXILLA;  Surgeon: Robert Bellow, MD;  Location: [facility];  Service: General;  Laterality: Right;   WISDOM TOOTH EXTRACTION      SOCIAL HISTORY: Social History   Socioeconomic History   Marital status: Married    Spouse name: Jaci Standard   Number of children: 2   Years of education: Not on file   Highest education level: Not on file  Occupational History   Occupation: and Optometrist  Tobacco Use   Smoking status: Never   Smokeless tobacco: Never  Vaping Use   Vaping Use: Never used  Substance and Sexual Activity   Alcohol  use: Never   Drug use: Never   Sexual activity: Yes    Birth control/protection: Other-see comments, Surgical    Comment: vasectomy  Other Topics Concern   Not on file  Social History Narrative   Pre-school teacher; in [place]; 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    Social Determinants of Health   Financial Resource Strain: Not on file  Food Insecurity: Not on file  Transportation Needs: Not on file  Physical Activity: Inactive (09/26/2017)  Exercise Vital Sign    Days of Exercise per Week: 0 days    Minutes of Exercise per Session: 0 min  Stress: Not on file  Social Connections: Not on file  Intimate Partner Violence: Not on file    FAMILY HISTORY: Family History  Problem Relation Age of Onset   Ovarian cysts Mother    Migraines Mother    Melanoma Mother        on leg   Hyperlipidemia Father    Diabetes Maternal Aunt    Stomach cancer Paternal Aunt    Diabetes Maternal Grandfather    Lymphoma Cousin 17   Breast cancer Neg Hx     ALLERGIES:  is allergic to metformin.  MEDICATIONS:  Current Outpatient Medications  Medication Sig Dispense Refill   acetaminophen (TYLENOL) 325 MG tablet Take 2 tablets (650 mg total) by mouth every 6 (six) hours as needed for mild pain (or Fever >/= 101).     ascorbic acid (VITAMIN C) 500 MG tablet Take 1 tablet (500 mg total) by mouth daily. 30 tablet    calcium-vitamin D (OSCAL WITH D) 500-5 MG-MCG tablet Take 1 tablet by mouth 2 (two) times daily. 60 tablet 2   Cyanocobalamin (VITAMIN B-12 PO) Take 1 tablet by mouth daily.     lidocaine-prilocaine (EMLA) cream Apply on the port. 30 -45 min  prior to port access. 30 g 3   Magnesium Cl-Calcium Carbonate (SLOW MAGNESIUM/CALCIUM) 70-117 MG TBEC Take 1 tablet by mouth 2 (two) times daily. 60 tablet 6   mupirocin ointment (BACTROBAN) 2 % Apply 1 Application topically 2 (two) times daily. 30 g 0   ONETOUCH VERIO test strip SMARTSIG:1 Each Via Meter Daily PRN     potassium chloride (KLOR-CON) 20 MEQ  packet Take 20 mEq by mouth 2 (two) times daily. (Patient taking differently: Take 20 mEq by mouth daily.) 60 packet 3   diazepam (VALIUM) 2 MG tablet Take 1 tablet (2 mg total) by mouth every 12 (twelve) hours as needed for up to 20 doses for muscle spasms. 20 tablet 0   ferrous sulfate 325 (65 FE) MG tablet Take 325 mg by mouth 2 (two) times daily with a meal.     Multiple Vitamin (MULTIVITAMIN WITH MINERALS) TABS tablet Take 1 tablet by mouth daily.     ondansetron (ZOFRAN) 4 MG tablet Take 1 tablet (4 mg total) by mouth every 8 (eight) hours as needed for up to 20 doses for nausea or vomiting. 20 tablet 0   ondansetron (ZOFRAN-ODT) 8 MG disintegrating tablet Take 8 mg by mouth every 8 (eight) hours as needed for nausea or vomiting.     oxyCODONE (ROXICODONE) 5 MG immediate release tablet Take 1 tablet (5 mg total) by mouth every 8 (eight) hours as needed for up to 20 doses for severe pain. 20 tablet 0   prochlorperazine (COMPAZINE) 10 MG tablet Take 1 tablet (10 mg total) by mouth every 6 (six) hours as needed for nausea or vomiting. 40 tablet 1   No current facility-administered medications for this visit.      Marland Kitchen  PHYSICAL EXAMINATION: ECOG PERFORMANCE STATUS: 0 - Asymptomatic  Vitals:   05/06/22 0900  BP: 119/80  Pulse: 94  Resp: 16  Temp: 99.2 F (37.3 C)     Filed Weights   05/06/22 0900  Weight: 217 lb 3.2 oz (98.5 kg)    Right breast-10:00 vague 3 to 4 cm mass noted-with skin thickening/ ?  Biopsy changes.  No  nipple changes no erythema.   Physical Exam Vitals and nursing note reviewed.  HENT:     Head: Normocephalic and atraumatic.     Mouth/Throat:     Pharynx: Oropharynx is clear.  Eyes:     Extraocular Movements: Extraocular movements intact.     Pupils: Pupils are equal, round, and reactive to light.  Cardiovascular:     Rate and Rhythm: Normal rate and regular rhythm.  Pulmonary:     Comments: Decreased breath sounds bilaterally.  Abdominal:      Palpations: Abdomen is soft.  Musculoskeletal:        General: Normal range of motion.     Cervical back: Normal range of motion.  Skin:    General: Skin is warm.  Neurological:     General: No focal deficit present.     Mental Status: She is alert and oriented to person, place, and time.  Psychiatric:        Behavior: Behavior normal.        Judgment: Judgment normal.      LABORATORY DATA:  I have reviewed the data as listed Lab Results  Component Value Date   WBC 5.2 04/15/2022   HGB 10.3 (L) 04/15/2022   HCT 31.1 (L) 04/15/2022   MCV 97.2 04/15/2022   PLT 168 04/15/2022   Recent Labs    12/27/21 2105 12/28/21 0329 03/24/22 0914 04/02/22 1035 04/15/22 0922  NA 134*   < > 139 140 139  K 3.6   < > 3.7 3.4* 3.4*  CL 104   < > 106 107 107  CO2 22   < > _0 GLUCOSE 192*   < > 145* 192* 149*  BUN 16   < > _1 CREATININE 0.93   < > 0.73 0.73 0.62  CALCIUM 8.6*   < > 8.3* 8.3* 8.7*  GFRNONAA >60   < > >60 >60 >60  PROT 7.0   < > 5.3* 5.9* 6.1*  ALBUMIN 3.7   < > 2.7* 2.9* 3.1*  AST 42*   < > _2 ALT 51*   < > _3 ALKPHOS 70   < > 42 46 44  BILITOT 1.1   < > 0.5 0.4 0.6  BILIDIR 0.2  --   --   --   --   IBILI 0.9  --   --   --   --    < > = values in this interval not displayed.    RADIOGRAPHIC STUDIES: I have personally reviewed the radiological images as listed and agreed with the findings in the report. DG BREAST SURGICAL SPECIMEN NO CHARGE  Result Date: 04/22/2022 This procedure is a no report and no charge.  It will auto finalize.  NM Sentinel Node Inj-No Rpt (Breast)  Result Date: 04/21/2022 Sulfur Colloid was injected by the Nuclear Medicine Technologist for sentinel lymph node localization.   [place] OR NERVE BLOCK-IMAGE ONLY [facility])  Result Date: 04/21/2022 There is no interpretation for this exam.  This order is for images obtained during a surgical procedure.  Please See "Surgeries" Tab for more information regarding the  procedure.   [place] RT PLC BREAST LOC DEV   1ST LESION  INC US GUIDE  Result Date: 04/21/2022 CLINICAL DATA:  Patient presents for wire localization of the RIGHT axilla. Patient is scheduled for mastectomy. EXAM: NEEDLE LOCALIZATION OF THE RIGHT AXILLA WITH ULTRASOUND GUIDANCE COMPARISON:  Previous exam(s). FINDINGS: Patient  presents for needle localization prior to axillary dissection and RIGHT mastectomy. I met with the patient and we discussed the procedure of needle localization including benefits and alternatives. We discussed the high likelihood of a successful procedure. We discussed the risks of the procedure, including infection, bleeding, tissue injury, and further surgery. Informed, written consent was given. The usual time-out protocol was performed immediately prior to the procedure. Using ultrasound guidance, sterile technique, 1% lidocaine and a 7 centimeter modified Kopans needle, the RIGHT axillary lymph node marked with a clip is localized using LATERAL approach. IMPRESSION: Needle localization of the RIGHT axilla . No apparent complications. Electronically Signed   By: Nolon Nations M.D.   On: 04/21/2022 11:39   ASSESSMENT & PLAN:   Carcinoma of upper-outer quadrant of right breast in female, estrogen receptor positive ([place]) # MAY 2023- STAGE III-T4N1 ER/PR positive HER2 POSITIVE right breast cancer; LN-positive-ER/PR positive however 2 NEGATIVE s/p biopsy [see discussion below].  Discussed with Dr. Sedalia Muta tumor heterogeneity. BREAST MRI- JUNE 2023- The patient's known malignancy in the upper outer quadrant of the right breast measures 3.8 x 2.8 x 3.6 cm. Numerous other suspicious masses are identified in the right breast as above involving the upper outer and upper inner quadrants, located both anterior and posterior to the known malignancy.  Numerous enhancing foci in the skin as above are worrisome for skin metastases; Two mildly prominent right internal mammary nodes were not  FDG avid on recent PET-CT imaging.    # Currently s/p neoadjuvant chemotherapy- TCH plus P x 6 cycles- ypT2 [101m]; ypN0.  Discussed the pathology with the patient and family-overall good response given the negative lymph nodes; however still with residual disease noted in the breast suggestive of partial response.  As per the NCCN guidelines recommend change in subsequent therapy to Ado-trastuzumab emtansine alone for 14 cycles.  Discussed the potential side effects including but not limited to liver toxicity neuropathy etc.   # Discussed that given the presence of residual disease/and also T4 disease at presentation-recommend evaluation with radiation oncology for consideration of postmastectomy radiation. Will refer to Dr.Crystal.   #   #Incidental findings on Imaging  CT scan OCT 2023: Enlarged heterogeneous uterus with poorly defined endometrial complex.Hepatic steatosis. I reviewed/discussed/counseled the patient.  This will need to be followed up later.   MUGA scan May 31st, 2023- -61%.   # Electrolyet abomilaities- Hypokalemia/hypomagnesemia-potassium 3.6 today-recommend 1K today; magnesium 1.4-2 g of IV mag today; and then Slow-Mag twice a day prescription sent.  # PN G-1-2 sec to Taxotere- monitor for now. STABLE  # DISPOSITION: # referral to Dr.Chrystal re: breast cancer # follow up in 4 weeks- MD;port flush-labs- cbc/cmp;Mag- Dr.B  # I reviewed the blood work- with the patient in detail; also reviewed the imaging independently [as summarized above]; and with the patient in detail.     All questions were answered. The patient/family knows to call the clinic with any problems, questions or concerns.       GCammie Sickle MD 05/06/2022 7:47 PM

## 2022-05-06 NOTE — Progress Notes (Signed)
DISCONTINUE ON PATHWAY REGIMEN - Breast     Cycle 1: A cycle is 21 days:     Pertuzumab      Trastuzumab-xxxx      Docetaxel      Carboplatin    Cycles 2 through 6: A cycle is every 21 days:     Pertuzumab      Trastuzumab-xxxx      Docetaxel      Carboplatin   **Always confirm dose/schedule in your pharmacy ordering system**  REASON: Other Reason PRIOR TREATMENT: [id number]: Docetaxel + Carboplatin + Trastuzumab IV + Pertuzumab IV (TCHP IV) q21 Days x 6 Cycles TREATMENT RESPONSE: Partial Response (PR)  START ON PATHWAY REGIMEN - Breast     A cycle is every 21 days:     Ado-trastuzumab emtansine   **Always confirm dose/schedule in your pharmacy ordering system**  Patient Characteristics: Post-Neoadjuvant Therapy and Resection, HER2 Positive, ER Positive, Residual Disease, Adjuvant Targeted Therapy After Neoadjuvant Chemo/Targeted Therapy Therapeutic Status: Post-Neoadjuvant Therapy and Resection Residual Invasive Disease Post-Neoadjuvant Therapy<= Yes ER Status: Positive (+) HER2 Status: Positive (+) PR Status: Positive (+) Intent of Therapy: Curative Intent, Discussed with Patient

## 2022-05-06 NOTE — Progress Notes (Signed)
Follow up after breast surgery on 04/21/22.

## 2022-05-06 NOTE — Assessment & Plan Note (Addendum)
#  MAY 2023- STAGE III-T4N1 ER/PR positive HER2 POSITIVE right breast cancer; LN-positive-ER/PR positive however 2 NEGATIVE s/p biopsy [see discussion below].  Discussed with Dr. Sedalia Muta tumor heterogeneity. BREAST MRI- JUNE 2023- The patient's known malignancy in the upper outer quadrant of the right breast measures 3.8 x 2.8 x 3.6 cm. Numerous other suspicious masses are identified in the right breast as above involving the upper outer and upper inner quadrants, located both anterior and posterior to the known malignancy.  Numerous enhancing foci in the skin as above are worrisome for skin metastases; Two mildly prominent right internal mammary nodes were not FDG avid on recent PET-CT imaging.    # Currently s/p neoadjuvant chemotherapy- TCH plus P x 6 cycles- ypT2 [63m]; ypN0.  Discussed the pathology with the patient and family-overall good response given the negative lymph nodes; however still with residual disease noted in the breast suggestive of partial response.  As per the NCCN guidelines recommend change in subsequent therapy to Ado-trastuzumab emtansine alone for 14 cycles.  Discussed the potential side effects including but not limited to liver toxicity neuropathy etc. also discussed the option of clinical trial and respect commands includes TDM 1+Tucatinib.  Information given.  Discussed the potential side effects of TDM 1 include neuropathy liver toxicity low blood counts.  Also discussed regarding endocrine therapy-tamoxifen.  Discuss OFS; Zometa every 6 months for 3 years.  # Discussed that given the presence of residual disease/and also T4 disease at presentation-recommend evaluation with radiation oncology for consideration of postmastectomy radiation. Will refer to Dr.Crystal.   #Incidental findings on Imaging  CT scan OCT 2023: Enlarged heterogeneous uterus with poorly defined endometrial complex.Hepatic steatosis. I reviewed/discussed/counseled the patient.  This will need to be  followed up later.  We to discuss at tumor conference.   MUGA scan May 31st, 2023- -61%.   # PN G-1-2 sec to Taxotere- monitor for now. STABLE  # DISPOSITION: # referral to Dr.Chrystal re: breast cancer # follow up in 4 weeks- MD;port flush-labs- cbc/cmp;Mag- Dr.B

## 2022-05-06 NOTE — Progress Notes (Signed)
Patient is a 46 year old female who underwent right simple mastectomy with sentinel node biopsy with Dr. Bary Castilla followed by right immediate breast reconstruction with placement of acellular dermal matrix and tissue expander to the right breast with Dr. Marla Roe on 04/21/2022.  Patient after surgery later developed a hematoma which was draining through the bulb.  Patient was closely monitored in the office and had close follow-up in the office as well.  Patient presents to clinic today for postoperative follow-up.     Patient was last seen in the clinic on 04/28/2022.  At this visit, patient was doing well.  She reported she was feeling much better than the week prior.  She reported that sometimes she has a little bit of pain, but the Tylenol and muscle relaxer seem to be helping.  She reported that approximately 50 to 60 cc of drainage was coming out of her drain every day.  She denied any fevers or chills.  On exam, right breast expanders in place.  There was some swelling noted to the right breast.  There was no overlying erythema.  There was some overlying ecchymosis.  There was some fluid palpated on exam.  Incision was intact with Steri-Strips.  The JP drain was in place and functioning.  There was dark serosanguineous drainage in the bulb.  At this visit, we attempted to massage some fluid out of the drain and help break up some of the fluid.  There is approximately 10 to 15 cc of dark serosanguineous drainage into the bulb.  We also attempted aspiration over the port site, but were unable to aspirate any significant amount of fluid.  Plan is for patient to continue compression at all times and gently massage the area.  Plan is for patient to follow-up in 1 week.  Patient is accompanied by her husband at bedside.  Today, patient reports she is doing well.  She states that she is feeling better.  She reports that she feels her breast is still a little swollen, but reports that it is not worsened.  She  reports that her drain has been putting out approximately 60 to 70 cc/day.  She states that she has been gently massaging her breast to help facilitate the fluid coming out into the drain.  She denies any other issues or concerns.  She denies any fevers or chills.   Chaperone present on exam.  On exam, patient is sitting upright in no acute distress.  Right breast expander is in place.  There is some swelling noted to the breast, slightly improved from last week.  There is some mild ecchymosis overlying the right breast.  There is no overlying erythema.  Incision appears to be intact with Steri-Strips.  There is some fluid noted upon palpation.  JP drain is in place and functioning.  There is approximately 5 cc of dark serosanguineous drainage in the bulb.  We were able to gently massage the area and were able to get approximately 15-20 cc of dark serosanguineous drainage into the bulb.  We placed injectable saline in the Expander using a sterile technique: Right: 25 cc for a total of 205 cc / 535 cc  We attempted aspiration over the port site to avoid injury to the expander.  We were unable to aspirate any significant amount of fluid.   I discussed with the patient that she should continue to gently massage her breast towards the drain site to help facilitate drainage.  I discussed with the patient that she needs  to continue compression at all times.  Patient expressed understanding.  Patient to follow-up next week for reevaluation.  I instructed the patient to call in the meantime if she had any questions or concerns.

## 2022-05-06 NOTE — Progress Notes (Signed)
I was called to the patients room. While emptying the patient's drain, her husband who was sitting in the chair next to her started to feel lightheaded, he reported that looking at the drain was making him sick to his stomach,.  He had a vasovagal episode.  We moved him to the exam chair and laid him flat, he immediately woke up and was alert oriented with no significant issues.  He denied any preceding symptoms other than the lightheadedness and dizziness that was associated with looking at the blood.  EMS was called, but given his rapid improvement and identifiable cause of the vasovagal episode we offered to cancel the EMS, the patient and his wife agreed that we did not necessarily need them at this time.  The patients encounter was continued, the patient's husband was back to his baseline with no concerns or issues throughout the rest of the visit.

## 2022-05-06 NOTE — Research (Signed)
Provider was inquiring about Clinical Trail opportunity for patient. Provided patient with informational consent for the [place] study and clinical trials brochure with contact cards for staff since no RN available. Marilyn Page will reach out to patient tomorrow to answer questions and possibility of clinical eligibility.  [facility]  Direct Dial: [phone] Fax: [phone]   05/06/2022 10:56 AM

## 2022-05-07 ENCOUNTER — Telehealth: Payer: Self-pay

## 2022-05-07 ENCOUNTER — Other Ambulatory Visit: Payer: Self-pay

## 2022-05-07 DIAGNOSIS — Z170 Estrogen receptor positive status [ER+]: Secondary | ICD-10-CM

## 2022-05-07 NOTE — Telephone Encounter (Signed)
Research nurse called patient to review the [id number] [employer] protocol study with her and answer any questions she may have. Patient was provided a copy of the informed consent on 05/06/2022 to read at home by Mauricio Po, [place]. Research nurse wanted to touch base with her and find out if she is interested in participating in the study after reviewing the protocol consent form. Patient did not answer her telephone, voice mail left on identified secure voice mail for her to return call with her decision either way to the research office, phone # provided.  Jeral Fruit, RN 05/07/22 4:06 PM

## 2022-05-10 ENCOUNTER — Other Ambulatory Visit: Payer: Self-pay

## 2022-05-11 ENCOUNTER — Encounter: Payer: Self-pay | Admitting: Plastic Surgery

## 2022-05-11 ENCOUNTER — Ambulatory Visit (INDEPENDENT_AMBULATORY_CARE_PROVIDER_SITE_OTHER): Payer: 59 | Admitting: Plastic Surgery

## 2022-05-11 VITALS — BP 120/80 | HR 87

## 2022-05-11 DIAGNOSIS — C50411 Malignant neoplasm of upper-outer quadrant of right female breast: Secondary | ICD-10-CM

## 2022-05-11 DIAGNOSIS — Z170 Estrogen receptor positive status [ER+]: Secondary | ICD-10-CM

## 2022-05-11 NOTE — Progress Notes (Signed)
The patient is a 46 year old female here for follow-up on her right breast surgery.  She had a mastectomy and has an expander in place.  She had a hematoma that is gotten much better over the last 3 weeks.  I was able to massage and get about 40 cc of dark blood out.  We will plan to take the drain out on Friday.  I was able to expand today.  We placed injectable saline in the Expander using a sterile technique: Right: 50 cc for a total of 255 / 535 cc

## 2022-05-12 ENCOUNTER — Encounter: Payer: Self-pay | Admitting: Radiation Oncology

## 2022-05-12 ENCOUNTER — Institutional Professional Consult (permissible substitution): Payer: 59 | Admitting: Radiation Oncology

## 2022-05-12 ENCOUNTER — Ambulatory Visit
Admission: RE | Admit: 2022-05-12 | Discharge: 2022-05-12 | Disposition: A | Discharge status: Home / Self Care | Payer: 59 | Source: Ambulatory Visit | Attending: Radiation Oncology | Admitting: Radiation Oncology

## 2022-05-12 VITALS — BP 104/69 | HR 74 | Temp 98.3°F | Resp 14 | Ht 66.0 in | Wt 218.1 lb

## 2022-05-12 DIAGNOSIS — Z170 Estrogen receptor positive status [ER+]: Secondary | ICD-10-CM | POA: Insufficient documentation

## 2022-05-12 DIAGNOSIS — Z79899 Other long term (current) drug therapy: Secondary | ICD-10-CM | POA: Insufficient documentation

## 2022-05-12 DIAGNOSIS — C50411 Malignant neoplasm of upper-outer quadrant of right female breast: Secondary | ICD-10-CM | POA: Diagnosis present

## 2022-05-12 DIAGNOSIS — E119 Type 2 diabetes mellitus without complications: Secondary | ICD-10-CM | POA: Diagnosis not present

## 2022-05-12 NOTE — Consult Note (Signed)
NEW PATIENT EVALUATION  Name: Marilyn Page  MRN: [id number]  Date:   05/12/2022     DOB: Jan 19, 1976   This 46 y.o. female patient presents to the clinic for initial evaluation of stage IIa post neoadjuvant chemotherapy (T2 N0 M0) triple positive invasive mammary carcinoma status post neoadjuvant chemotherapy and modified radical mastectomy of the right breast.  REFERRING PHYSICIAN: Latanya Maudlin, NP  CHIEF COMPLAINT:  Chief Complaint  Patient presents with   Breast Cancer    Consult    DIAGNOSIS: The encounter diagnosis was Carcinoma of upper-outer quadrant of right breast in female, estrogen receptor positive ([place]).   PREVIOUS INVESTIGATIONS:  CT scans PET scans MRI scans of the breast and mammograms reviewed Pathology reports reviewed Clinical notes reviewed  HPI: Patient is a 46 year old female who presented with a self palpated mass in the right breast.  Mammogram and ultrasound confirms a suspicious mass at the 10 o'clock position.  There is also cortical thickening of abnormal right axillary lymph nodes.  There is also skin thickening throughout the right breast.  She underwent biopsy of the right breast showing invasive mammary carcinoma no special type.  Tumor was triple positive.  Right axillary lymph node was also biopsied under ultrasound guidance and also positive for macro metastatic mammary carcinoma measuring up to 6 mm.  She underwent repeat biopsy of her skin and showing minute cluster of atypical cells suspicious for dermal involvement and previously diagnosed invasive mammary carcinoma.  Initial PET/CT scan showed hypermetabolic right lateral breast mass consistent with known breast cancer.  There were small hyperbolic metabolic right axillary node suggesting metastatic adenopathy.  Initially CT scan showed possible internal mammary nodes although's were negative on PET CT scan.  Breast MRI demonstrated multiple foci of cancer largest measuring 3.8 cm in the upper  outer quadrant.  There were no numerous other suspicious masses in the right breast involving the upper outer and upper inner quadrants.  There are also numerous enhancing foci in the skin worrisome for metastatic disease.  There is no other evidence of metastatic disease.  Patient underwent neoadjuvant chemotherapy with TCH plus P.  Patient then underwent right modified radical mastectomy.  There was 2.1 cm of residual invasive mammary carcinoma overall grade 2.  2 sentinel nodes were sampled and negative for metastatic disease.  Surgical margins were clear at 1 cm for both the DCIS component as well as the invasive component.  She is now referred to radiation oncology for consideration of chest wall and peripheral lymphatic radiation.  Patient is doing well she has developed a hematoma and does have a spacer in for breast reconstruction. PLANNED TREATMENT REGIMEN: Right chest wall and peripheral lymphatic radiation  PAST MEDICAL HISTORY:  has a past medical history of Anemia, Carcinoma of upper-outer quadrant of right breast in female, estrogen receptor positive ([place]) (10/26/2021), Diabetes mellitus without complication ([place]), Family history of adverse reaction to anesthesia, History of gestational diabetes, Neuromuscular disorder ([place]), and Personal history of chemotherapy.    PAST SURGICAL HISTORY:  Past Surgical History:  Procedure Laterality Date   BREAST BIOPSY Right 10/19/2021   [place] Core bx 10:00 6 cmfn Ribbon Clip-INVASIVE MAMMARY CARCINOMA,. Axilla Butterfly hydro clip-MACRO METASTATIC MAMMARY CARCINOMA   BREAST BIOPSY Right 04/21/2022   [place] RT PLC BREAST LOC DEV   1ST LESION  INC US GUIDE 04/21/2022 [facility]   BREAST RECONSTRUCTION WITH PLACEMENT OF TISSUE EXPANDER AND FLEX HD (ACELLULAR HYDRATED DERMIS) Right 04/21/2022   Procedure: RIGHT BREAST RECONSTRUCTION WITH  PLACEMENT OF TISSUE EXPANDER AND FLEX HD (ACELLULAR HYDRATED DERMIS);  Surgeon: Wallace Going, DO;  Location: [facility]  ORS;  Service: Plastics;  Laterality: Right;   CESAREAN SECTION  2005/2008   DILATION AND CURETTAGE OF UTERUS  2003   PORTACATH PLACEMENT Left 11/09/2021   Procedure: INSERTION PORT-A-CATH;  Surgeon: Robert Bellow, MD;  Location: [facility] ORS;  Service: General;  Laterality: Left;   SIMPLE MASTECTOMY WITH AXILLARY SENTINEL NODE BIOPSY Right 04/21/2022   Procedure: SIMPLE MASTECTOMY WITH AXILLARY SENTINEL NODE BIOPSY;  Surgeon: Robert Bellow, MD;  Location: [facility] ORS;  Service: General;  Laterality: Right;  wire localization of lymph node   SKIN BIOPSY Right 11/09/2021   Procedure: PUNCH BIOPSY SKIN, TATTOO LYMPH NODE RIGHT AXILLA;  Surgeon: Robert Bellow, MD;  Location: [facility] ORS;  Service: General;  Laterality: Right;   WISDOM TOOTH EXTRACTION      FAMILY HISTORY: family history includes Diabetes in her maternal aunt and maternal grandfather; Hyperlipidemia in her father; Lymphoma (age of onset: 68) in her cousin; Melanoma in her mother; Migraines in her mother; Ovarian cysts in her mother; Stomach cancer in her paternal aunt.  SOCIAL HISTORY:  reports that she has never smoked. She has never used smokeless tobacco. She reports that she does not drink alcohol and does not use drugs.  ALLERGIES: Metformin  MEDICATIONS:  Current Outpatient Medications  Medication Sig Dispense Refill   acetaminophen (TYLENOL) 325 MG tablet Take 2 tablets (650 mg total) by mouth every 6 (six) hours as needed for mild pain (or Fever >/= 101).     ascorbic acid (VITAMIN C) 500 MG tablet Take 1 tablet (500 mg total) by mouth daily. 30 tablet    calcium-vitamin D (OSCAL WITH D) 500-5 MG-MCG tablet Take 1 tablet by mouth 2 (two) times daily. 60 tablet 2   Cyanocobalamin (VITAMIN B-12 PO) Take 1 tablet by mouth daily.     ferrous sulfate 325 (65 FE) MG tablet Take 325 mg by mouth 2 (two) times daily with a meal.     lidocaine-prilocaine (EMLA) cream Apply on the port. 30 -45 min  prior to port access. 30 g  3   Magnesium Cl-Calcium Carbonate (SLOW MAGNESIUM/CALCIUM) 70-117 MG TBEC Take 1 tablet by mouth 2 (two) times daily. 60 tablet 6   Multiple Vitamin (MULTIVITAMIN WITH MINERALS) TABS tablet Take 1 tablet by mouth daily.     mupirocin ointment (BACTROBAN) 2 % Apply 1 Application topically 2 (two) times daily. 30 g 0   ondansetron (ZOFRAN) 4 MG tablet Take 1 tablet (4 mg total) by mouth every 8 (eight) hours as needed for up to 20 doses for nausea or vomiting. 20 tablet 0   ondansetron (ZOFRAN-ODT) 8 MG disintegrating tablet Take 8 mg by mouth every 8 (eight) hours as needed for nausea or vomiting.     ONETOUCH VERIO test strip SMARTSIG:1 Each Via Meter Daily PRN     potassium chloride (KLOR-CON) 20 MEQ packet Take 20 mEq by mouth 2 (two) times daily. (Patient taking differently: Take 20 mEq by mouth daily.) 60 packet 3   prochlorperazine (COMPAZINE) 10 MG tablet Take 1 tablet (10 mg total) by mouth every 6 (six) hours as needed for nausea or vomiting. 40 tablet 1   No current facility-administered medications for this encounter.    ECOG PERFORMANCE STATUS:  0 - Asymptomatic  REVIEW OF SYSTEMS: Patient denies any weight loss, fatigue, weakness, fever, chills or night sweats. Patient denies any loss of vision,  blurred vision. Patient denies any ringing  of the ears or hearing loss. No irregular heartbeat. Patient denies heart murmur or history of fainting. Patient denies any chest pain or pain radiating to her upper extremities. Patient denies any shortness of breath, difficulty breathing at night, cough or hemoptysis. Patient denies any swelling in the lower legs. Patient denies any nausea vomiting, vomiting of blood, or coffee ground material in the vomitus. Patient denies any stomach pain. Patient states has had normal bowel movements no significant constipation or diarrhea. Patient denies any dysuria, hematuria or significant nocturia. Patient denies any problems walking, swelling in the joints or  loss of balance. Patient denies any skin changes, loss of hair or loss of weight. Patient denies any excessive worrying or anxiety or significant depression. Patient denies any problems with insomnia. Patient denies excessive thirst, polyuria, polydipsia. Patient denies any swollen glands, patient denies easy bruising or easy bleeding. Patient denies any recent infections, allergies or URI. Patient "s visual fields have not changed significantly in recent time.   PHYSICAL EXAM: BP 104/69   Pulse 74   Temp 98.3 F (36.8 C)   Resp 14   Ht '5\' 6"'$  (1.676 m)   Wt 218 lb 1.6 oz (98.9 kg)   LMP  (LMP Unknown) Comment: Urine Pregnanacy Negative  BMI 35.20 kg/m  Patient status post right modified radical mastectomy.  Incisions healed well.  She does have a hematoma apparently and a spacer involved in the chest wall.  No evidence of lymphedema in her right upper extremity no axillary or supraclavicular adenopathy.  Left breast is free of mass.  Well-developed well-nourished patient in NAD. HEENT reveals PERLA, EOMI, discs not visualized.  Oral cavity is clear. No oral mucosal lesions are identified. Neck is clear without evidence of cervical or supraclavicular adenopathy. Lungs are clear to A&P. Cardiac examination is essentially unremarkable with regular rate and rhythm without murmur rub or thrill. Abdomen is benign with no organomegaly or masses noted. Motor sensory and DTR levels are equal and symmetric in the upper and lower extremities. Cranial nerves II through XII are grossly intact. Proprioception is intact. No peripheral adenopathy or edema is identified. No motor or sensory levels are noted. Crude visual fields are within normal range.  LABORATORY DATA: Pathology reports reviewed    RADIOLOGY RESULTS: Multiple imaging studies including MRI PET scan CT scans and mammograms all reviewed   IMPRESSION: Clinical stage IIa post neoadjuvant chemotherapy and 46 year old female with initial skin axillary  involvement and multifocal disease of her right breast for invasive mammary carcinoma triple positive for adjuvant postmastectomy radiation  PLAN: At this time based on the multiple poor prognostic factors including triple positive nature of her disease initial skin involvement size of her initial lesion and PET scan evidence of axillary involvement with a limited sentinel node biopsy after neoadjuvant chemotherapy would recommend right chest wall and peripheral lymphatic radiation.  Will plan on delivering 5040 cGy in 28 fractions.  Would also boost her scar chest wall scar another 1000 cGy using electron beam.  Risks and benefits of treatment including skin reaction fatigue alteration blood counts possible inclusion of superficial lung chance of lymphedema of the right upper extremity all were reviewed in detail with the patient.  I will reach out to Dr. Marla Roe to discuss timing of my radiation therapy.  Based on her poor prognostic factors would like to get started as soon as possible although would delay treatments if her reconstruction could be expedited.  Patient comprehends  my recommendations well left tentatively set up a simulation date in about 2 weeks.  I would like to take this opportunity to thank you for allowing me to participate in the care of your patient.Noreene Filbert, MD

## 2022-05-13 NOTE — Progress Notes (Deleted)
Patient is a 46 year old female with PMH of right simple mastectomy and sentinel lymph node biopsy and immediate right-sided reconstruction using tissue expander and Flex HD performed 04/21/2022 by Dr. Bary Castilla and Dr. Marla Roe who presents to clinic for postoperative follow-up and drain removal..  Reviewed previous notes and it sounds as though she had a small hematoma leading to dark bloody drainage from her drain tube insertion site.  Approximately 40 cc of dark red blood was massaged out at most recent encounter 05/11/2022.  Injectable saline was placed in expander for a total of 255/535 cc at that time.  Plan was for drain removal at subsequent visit.  [facility] DRAIN SO IT IS LARGER AT THE END THAT IS INSIDE THE MASTECTOMY SITE!~ WILL FEEL RESISTANCE!

## 2022-05-14 ENCOUNTER — Ambulatory Visit (INDEPENDENT_AMBULATORY_CARE_PROVIDER_SITE_OTHER): Payer: 59 | Admitting: Physician Assistant

## 2022-05-14 VITALS — BP 110/73 | HR 94

## 2022-05-14 DIAGNOSIS — Z9889 Other specified postprocedural states: Secondary | ICD-10-CM

## 2022-05-14 NOTE — Progress Notes (Signed)
Patient is a 46 year old female with PMH of right simple mastectomy and sentinel lymph node biopsy and immediate right-sided reconstruction using tissue expander and Flex HD performed 04/21/2022 by Dr. Bary Castilla and Dr. Marla Roe who presents to clinic for postoperative follow-up and drain removal..  Reviewed previous notes and it sounds as though she had a small hematoma leading to dark bloody drainage from her drain tube insertion site.  Approximately 40 cc of dark red blood was massaged out at most recent encounter 05/11/2022.  Injectable saline was placed in expander for a total of 255/535 cc at that time.  Plan was for drain removal at subsequent visit.  Her last office visit the patient notes she has been doing well.  She denies any signs of infection.  She notes that her JP drain has continued to put out serosanguineous fluid greater than 40 cc/day.  She notes she has followed up with her radiation oncologist Dr. Donella Stade who would recommend radiation therapy noting that he would be reaching out to Dr. Marla Roe to discuss this further.  Chaperone present.  On exam right breast with viable mastectomy flaps with good cap refill, no overlying redness, no warmth to touch, no palpable fluid-filled collections.  JP drain with serosanguineous output.  Drain site clean with no purulence, incisions clean dry and intact.  Photos taken today   Injectable saline was placed into bilateral tissue expanders    Right: 25cc for a total of 280/535cc  The patient tolerated this well without any issues    Overall the patient is doing well, she has no signs of infection.  I did leave her drain in as it continued to have significant output.  I discussed case with Dr. Marla Roe we will plan for another fill today and bring her back next week for repeat evaluation.  The patient was given strict return precautions, she verbalized understanding and agreement to this plan and had no further questions or concerns.

## 2022-05-19 ENCOUNTER — Inpatient Hospital Stay: Payer: 59 | Attending: Internal Medicine | Admitting: Occupational Therapy

## 2022-05-19 ENCOUNTER — Inpatient Hospital Stay: Payer: 59

## 2022-05-19 DIAGNOSIS — M25611 Stiffness of right shoulder, not elsewhere classified: Secondary | ICD-10-CM

## 2022-05-19 DIAGNOSIS — C50411 Malignant neoplasm of upper-outer quadrant of right female breast: Secondary | ICD-10-CM

## 2022-05-19 DIAGNOSIS — Z170 Estrogen receptor positive status [ER+]: Secondary | ICD-10-CM | POA: Insufficient documentation

## 2022-05-19 DIAGNOSIS — K760 Fatty (change of) liver, not elsewhere classified: Secondary | ICD-10-CM | POA: Insufficient documentation

## 2022-05-19 DIAGNOSIS — C773 Secondary and unspecified malignant neoplasm of axilla and upper limb lymph nodes: Secondary | ICD-10-CM | POA: Insufficient documentation

## 2022-05-19 NOTE — Research (Signed)
Trial: DCP-001 Use of a Clinical Trial Screening Tool to Address Cancer Health Disparities in the [place] Program [place])  Patient Marilyn Page was identified by this nurse as a potential candidate for the above listed study.  This Clinical Research Nurse met with AIRICA SCHWARTZKOPF, [id number], on 05/19/22 in a manner and location that ensures patient privacy to discuss participation in the above listed research study.  Patient is Unaccompanied.  A copy of the informed consent document and separate HIPAA Authorization was provided to the patient.  Patient reads, speaks, and understands [place].    Patient was provided with the business card of this Nurse and encouraged to contact the research team with any questions.  Patient was provided the option of taking informed consent documents home to review and was encouraged to review at their convenience with their support network, including other care providers. Patient is comfortable with making a decision regarding study participation today.  As outlined in the informed consent form, this Nurse and Claudean Severance discussed the purpose of the research study, the investigational nature of the study, study procedures and requirements for study participation, potential risks and benefits of study participation, as well as alternatives to participation. This study is not blinded. The patient understands participation is voluntary and they may withdraw from study participation at any time.  This study does not involve randomization.  This study does not involve an investigational drug or device. This study does not involve a placebo. Patient understands enrollment is pending full eligibility review.   Confidentiality and how the patient's information will be used as part of study participation were discussed.  Patient was informed there is not reimbursement provided for their time and effort spent on trial participation.  The patient is  encouraged to discuss research study participation with their insurance provider to determine what costs they may incur as part of study participation, including research related injury.    All questions were answered to patient's satisfaction.  The informed consent and separate HIPAA Authorization was reviewed page by page.  The patient's mental and emotional status is appropriate to provide informed consent, and the patient verbalizes an understanding of study participation.  Patient has agreed to participate in the above listed research study and has voluntarily signed the informed consent dated 07/06/2021 and separate HIPAA Authorization, version 15  on 05/19/22 at 1025AM.  The patient was provided with a copy of the signed informed consent form and separate HIPAA Authorization for their reference.  No study specific procedures were obtained prior to the signing of the informed consent document.  Approximately 20 minutes were spent with the patient reviewing the informed consent documents.  After obtaining informed consent patient, voluntarily signed the optional Release of Information form for use throughout trial participation.   Patient answered all of the protocol questions to complete the study except for her financial status.  Jeral Fruit, RN 05/19/22 10:38 AM

## 2022-05-19 NOTE — Therapy (Signed)
[facility] at [place], [place], [place], [address] Phone: [phone]   Fax:  [phone]  Occupational Therapy Screen:  Patient Details  Name: Marilyn Page MRN: [id number] Date of Birth: 10/02/1975 No data recorded  Encounter Date: 05/19/2022   OT End of Session - 05/19/22 1754     Visit Number 0             Past Medical History:  Diagnosis Date   Anemia    Carcinoma of upper-outer quadrant of right breast in female, estrogen receptor positive ([place]) 10/26/2021   Diabetes mellitus without complication ([place])    Family history of adverse reaction to anesthesia    grandmother had issues but doesnt know what the issue was, she was older.   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 Axilla Butterfly hydro clip-MACRO METASTATIC MAMMARY CARCINOMA   BREAST BIOPSY Right 04/21/2022   [place] RT PLC BREAST LOC DEV   1ST LESION  INC US GUIDE 04/21/2022 [facility]   BREAST RECONSTRUCTION WITH PLACEMENT OF TISSUE EXPANDER AND FLEX HD (ACELLULAR HYDRATED DERMIS) Right 04/21/2022   Procedure: RIGHT BREAST RECONSTRUCTION WITH PLACEMENT OF TISSUE EXPANDER AND FLEX HD (ACELLULAR HYDRATED DERMIS);  Surgeon: Wallace Going, DO;  Location: [facility];  Service: Plastics;  Laterality: Right;   CESAREAN SECTION  2005/2008   DILATION AND CURETTAGE OF UTERUS  2003   PORTACATH PLACEMENT Left 11/09/2021   Procedure: INSERTION PORT-A-CATH;  Surgeon: Robert Bellow, MD;  Location: [facility];  Service: General;  Laterality: Left;   SIMPLE MASTECTOMY WITH AXILLARY SENTINEL NODE BIOPSY Right 04/21/2022   Procedure: SIMPLE MASTECTOMY WITH AXILLARY SENTINEL NODE BIOPSY;  Surgeon:  Robert Bellow, MD;  Location: [facility];  Service: General;  Laterality: Right;  wire localization of lymph node   SKIN BIOPSY Right 11/09/2021   Procedure: PUNCH BIOPSY SKIN, TATTOO LYMPH NODE RIGHT AXILLA;  Surgeon: Robert Bellow, MD;  Location: [facility];  Service: General;  Laterality: Right;   WISDOM TOOTH EXTRACTION      There were no vitals filed for this visit.   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  They still doing some fillings with expanders.  At and then I still have a drain I am seeing the surgeon this Friday.  I am supposed to have my radiation simulation on the 18th.  Shoulder is stiff and I feel a pull under my arm and chest    Currently in Pain? Yes    Pain Score 1     Pain Location Chest    Pain Orientation Right    Pain Descriptors / Indicators Tightness    Pain Type Surgical pain                 LYMPHEDEMA/ONCOLOGY QUESTIONNAIRE - 05/19/22 0001       Right Upper Extremity Lymphedema   15 cm Proximal to Olecranon Process 39.5 cm    10 cm Proximal to Olecranon Process 37 cm    Olecranon Process 29 cm    15 cm Proximal to Ulnar Styloid Process 30 cm    10 cm  Proximal to Ulnar Styloid Process 25 cm      Left Upper Extremity Lymphedema   15 cm Proximal to Olecranon Process 41 cm    10 cm Proximal to Olecranon Process 37.5 cm    Olecranon Process 30 cm    15 cm Proximal to Ulnar Styloid Process 30 cm    10 cm Proximal to Ulnar Styloid Process 26 cm               05/14/22 Plastic surgeon note:  Patient is a 46 year old female with PMH of right simple mastectomy and sentinel lymph node biopsy and immediate right-sided reconstruction using tissue expander and Flex HD performed 04/21/2022 by Dr. Bary Castilla and Dr. Marla Roe who presents to clinic for postoperative follow-up and drain removal..   Reviewed previous notes and it sounds as though she had a small hematoma  leading to dark bloody drainage from her drain tube insertion site.  Approximately 40 cc of dark red blood was massaged out at most recent encounter 05/11/2022.  Injectable saline was placed in expander for a total of 255/535 cc at that time.  Plan was for drain removal at subsequent visit.   Her last office visit the patient notes she has been doing well.  She denies any signs of infection.  She notes that her JP drain has continued to put out serosanguineous fluid greater than 40 cc/day.  She notes she has followed up with her radiation oncologist Dr. Donella Stade who would recommend radiation therapy noting that he would be reaching out to Dr. Marla Roe to discuss this further.   Chaperone present.  On exam right breast with viable mastectomy flaps with good cap refill, no overlying redness, no warmth to touch, no palpable fluid-filled collections.  JP drain with serosanguineous output.  Drain site clean with no purulence, incisions clean dry and intact.   Photos taken today     Injectable saline was placed into bilateral tissue expanders                Right: 25cc for a total of 280/535cc   The patient tolerated this well without any issues      Overall the patient is doing well, she has no signs of infection.  I did leave her drain in as it continued to have significant output.  I discussed case with Dr. Marla Roe we will plan for another fill today and bring her back next week for repeat evaluation.  The patient was given strict return precautions, she verbalized understanding and agreement to this plan and had no further questions or concerns.     OT SCREEN 05/19/22: Patient arrived this date postop right mastectomy on 04/21/2022 with immediate reconstruction expanders.  Patient getting feelings at the moment.  Patient with increased stiffness and discomfort in right shoulder and chest underarm.  Patient still have 1 drain in.  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 Patient is right-hand dominant.  Shoulder flexion is 115.  And abduction 80 degrees.  Pain-free. Patient to maintain about 90 degrees of shoulder flexion and abduction pain-free until seeing surgeon and getting okay to work on over head but not until okay by surgeon and drain comes out. Patient is scheduled for simulation for radiation on Monday the 18th. Next appointment with plastic surgery is this Friday, 15 December.  Circumference appear to be a little increased on the left upper arm and elbow patient probably carrying and lifting more with the left nondominant arm.  Patient to follow-up next week with me.                         Visit Diagnosis: Stiffness of right shoulder, not elsewhere classified    Problem List Patient Active Problem List   Diagnosis Date Noted   Lactic acid acidosis 03/06/2022   Fever and chills 03/05/2022   COVID-19 virus infection 12/28/2021   Thrombocytopenia ([place]) 12/28/2021   Diabetes mellitus ([place]) 12/28/2021   Febrile neutropenia ([place]) 12/27/2021   Sepsis ([place]) 12/27/2021   Genetic testing 12/01/2021   Carcinoma of upper-outer quadrant of right breast in female, estrogen receptor positive ([place]) 10/26/2021   Uterus, adenomyosis 05/14/2021   ASCUS of cervix with negative high risk HPV 04/23/2021   Iron deficiency anemia due to chronic blood loss 04/23/2021   Type 2 diabetes mellitus without complication, without long-term current use of insulin ([place]) 05/18/2020   RLS (restless legs syndrome) 05/14/2020   Menorrhagia with regular cycle 10/14/2017   BMI 37.0-37.9, adult 10/03/2017   History of gestational diabetes 10/03/2017    Rosalyn Gess, OTR/L,CLT 05/19/2022, 5:55 PM  [place] at [facility] [address] [place], [place], [address] Phone: [phone]   Fax:  [phone]  Name: CAMAURI FLEECE MRN: [id number] Date of Birth: [birth year]

## 2022-05-20 NOTE — Progress Notes (Signed)
Patient is a 46 year old female with PMH of right-sided simple mastectomy and immediate right-sided reconstruction using tissue expander Flex HD performed 04/21/2022 by Dr. Marla Roe presents to clinic for postoperative follow-up.  She was last seen here in an office 05/14/2022.  At that time, exam was reassuring.  She was still however experiencing high volume output from right-sided drain and decision was made to leave it in place.  Reevaluate 1 week.  25 cc was placed in her expander for a total of 280/535 cc.  Today, she still unfortunately was putting out approximately 50 to 60 cc from her drain.  She states that she is hopeful that can be removed, but understands that due to high volume output that it may not be possible at this time.  She is companied by her husband at bedside.  She tells me that she is scheduled for RT mapping on 05/24/2022.  She states that she was mildly tight after her last expander fill.  She denies any redness, pain, or other concerns.  Exam is reassuring.  Skin is mildly taut, but amenable to additional fill today.  No overlying erythema.  The incision is CDI.  No obvious seromas noted.  She does have an old hematoma over superior pole that is improving, overlying old ecchymoses.  We placed injectable saline in the Expander using a sterile technique: Right: 25 cc for a total of 305 / 535 cc  She is going to have radiation x 6 weeks, 5 days/week.  She understands that she is having a delayed implant exchange.  At that time, she may consider contralateral oncoplastic reduction and mastopexy for symmetry.  If she is displeased with the current size of her right side, she may require latissimus dorsi or TRAM for additional volume given that a lot of skin was excised at time of mastectomy and she is already relatively taut.

## 2022-05-21 ENCOUNTER — Ambulatory Visit (INDEPENDENT_AMBULATORY_CARE_PROVIDER_SITE_OTHER): Payer: 59 | Admitting: Physician Assistant

## 2022-05-21 VITALS — BP 110/62 | HR 88

## 2022-05-21 DIAGNOSIS — Z9889 Other specified postprocedural states: Secondary | ICD-10-CM

## 2022-05-24 ENCOUNTER — Ambulatory Visit: Admission: RE | Admit: 2022-05-24 | Payer: 59 | Source: Ambulatory Visit

## 2022-05-24 DIAGNOSIS — R7989 Other specified abnormal findings of blood chemistry: Secondary | ICD-10-CM | POA: Insufficient documentation

## 2022-05-26 ENCOUNTER — Inpatient Hospital Stay: Payer: 59 | Admitting: Occupational Therapy

## 2022-05-26 DIAGNOSIS — M25611 Stiffness of right shoulder, not elsewhere classified: Secondary | ICD-10-CM

## 2022-05-26 NOTE — Therapy (Signed)
[facility] at [place], [place], [place], [address] Phone: [phone]   Fax:  [phone]  Occupational Therapy Screen:  Patient Details  Name: Marilyn Page MRN: [id number] Date of Birth: June 13, 1975 No data recorded  Encounter Date: 05/26/2022   OT End of Session - 05/26/22 1855     Visit Number 0             Past Medical History:  Diagnosis Date   Anemia    Carcinoma of upper-outer quadrant of right breast in female, estrogen receptor positive ([place]) 10/26/2021   Diabetes mellitus without complication ([place])    Family history of adverse reaction to anesthesia    grandmother had issues but doesnt know what the issue was, she was older.   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 Axilla Butterfly hydro clip-MACRO METASTATIC MAMMARY CARCINOMA   BREAST BIOPSY Right 04/21/2022   [place] RT PLC BREAST LOC DEV   1ST LESION  INC US GUIDE 04/21/2022 [facility]   BREAST RECONSTRUCTION WITH PLACEMENT OF TISSUE EXPANDER AND FLEX HD (ACELLULAR HYDRATED DERMIS) Right 04/21/2022   Procedure: RIGHT BREAST RECONSTRUCTION WITH PLACEMENT OF TISSUE EXPANDER AND FLEX HD (ACELLULAR HYDRATED DERMIS);  Surgeon: Wallace Going, DO;  Location: [facility];  Service: Plastics;  Laterality: Right;   CESAREAN SECTION  2005/2008   DILATION AND CURETTAGE OF UTERUS  2003   PORTACATH PLACEMENT Left 11/09/2021   Procedure: INSERTION PORT-A-CATH;  Surgeon: Robert Bellow, MD;  Location: [facility];  Service: General;  Laterality: Left;   SIMPLE MASTECTOMY WITH AXILLARY SENTINEL NODE BIOPSY Right 04/21/2022   Procedure: SIMPLE MASTECTOMY WITH AXILLARY SENTINEL NODE BIOPSY;  Surgeon:  Robert Bellow, MD;  Location: [facility];  Service: General;  Laterality: Right;  wire localization of lymph node   SKIN BIOPSY Right 11/09/2021   Procedure: PUNCH BIOPSY SKIN, TATTOO LYMPH NODE RIGHT AXILLA;  Surgeon: Robert Bellow, MD;  Location: [facility];  Service: General;  Laterality: Right;   WISDOM TOOTH EXTRACTION      There were no vitals filed for this visit.   Subjective Assessment - 05/26/22 1854     Subjective  I had another feeling that the day so I got tight again.  Also they can take my drain out so that was disappointing.  I have been doing the exercises but still feel the pull under my arm especially with a sideways exercise.  They did move out my radiation appointment to next week.    Currently in Pain? Yes    Pain Score 4     Pain Location Axilla    Pain Orientation Right    Pain Descriptors / Indicators Aching;Tightness    Pain Type Surgical pain    Pain Onset 1 to 4 weeks ago    Pain Frequency Intermittent                 LYMPHEDEMA/ONCOLOGY QUESTIONNAIRE - 05/26/22 0001       Right Upper Extremity Lymphedema   15 cm Proximal to Olecranon Process 39.5 cm    10 cm Proximal to Olecranon Process 36.5 cm    Olecranon Process 29 cm    15 cm Proximal to  Ulnar Styloid Process 30 cm    10 cm Proximal to Ulnar Styloid Process 25 cm                   05/21/22 appt with plastic surgery:  Patient is a 46 year old female with PMH of right-sided simple mastectomy and immediate right-sided reconstruction using tissue expander Flex HD performed 04/21/2022 by Dr. Marla Roe presents to clinic for postoperative follow-up.   She was last seen here in an office 05/14/2022.  At that time, exam was reassuring.  She was still however experiencing high volume output from right-sided drain and decision was made to leave it in place.  Reevaluate 1 week.  25 cc was placed in her expander for a total of 280/535 cc.   Today, she still unfortunately was putting  out approximately 50 to 60 cc from her drain.  She states that she is hopeful that can be removed, but understands that due to high volume output that it may not be possible at this time.  She is companied by her husband at bedside.  She tells me that she is scheduled for RT mapping on 05/24/2022.  She states that she was mildly tight after her last expander fill.  She denies any redness, pain, or other concerns.   Exam is reassuring.  Skin is mildly taut, but amenable to additional fill today.  No overlying erythema.  The incision is CDI.  No obvious seromas noted.  She does have an old hematoma over superior pole that is improving, overlying old ecchymoses.   We placed injectable saline in the Expander using a sterile technique: Right: 25 cc for a total of 305 / 535 cc   She is going to have radiation x 6 weeks, 5 days/week.  She understands that she is having a delayed implant exchange.  At that time, she may consider contralateral oncoplastic reduction and mastopexy for symmetry.  If she is displeased with the current size of her right side, she may require latissimus dorsi or TRAM for additional volume given that a lot of skin was excised at time of mastectomy and she is already relatively taut.  OT SCREEN 05/19/22: Patient arrived this date postop right mastectomy on 04/21/2022 with immediate reconstruction expanders.  Patient getting feelings at the moment.  Patient with increased stiffness and discomfort in right shoulder and chest underarm.  Patient still have 1 drain in.  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 Patient is right-hand dominant.  Shoulder flexion is 115.  And abduction 80 degrees.  Pain-free. Patient to maintain about 90 degrees of shoulder flexion and abduction pain-free until seeing surgeon and getting okay to work on over head but not until okay by surgeon and drain comes out. Patient is scheduled for simulation for radiation on Monday the  18th. Next appointment with plastic surgery is this Friday, 15 December.   OT SCREEN 05/26/22:  Patient arrived this date postop right mastectomy on 04/21/2022 with immediate reconstruction expanders.  Patient cont to have drain in. Follow up tomorrow again with plastic surgery.  Patient showed increase R shoulder AROM since last time but cont to have stiffness and discomfort in right  axilla and chest underarm with shoulder flexion and ABD.  Patient still have 1 drain in.  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 Patient is right-hand dominant.  Shoulder flexion is 115.  And abduction 90 degrees coming in    Reviewed  with patient home program again.  This date provided patient with pulleys to work on shoulder active assisted range of motion flexion and abduction 20 reps keeping pain under 4/10. Followed by supine using wand for shoulder flexion and abduction as well as working on light external rotation. Shoulder flexion increased to 135 degrees and abduction to 115 on R shoulder   Patient has radiation simulator next week and will follow-up with me again.                       Visit Diagnosis: Stiffness of right shoulder, not elsewhere classified    Problem List Patient Active Problem List   Diagnosis Date Noted   Lactic acid acidosis 03/06/2022   Fever and chills 03/05/2022   COVID-19 virus infection 12/28/2021   Thrombocytopenia ([place]) 12/28/2021   Diabetes mellitus ([place]) 12/28/2021   Febrile neutropenia ([place]) 12/27/2021   Sepsis ([place]) 12/27/2021   Genetic testing 12/01/2021   Carcinoma of upper-outer quadrant of right breast in female, estrogen receptor positive ([place]) 10/26/2021   Uterus, adenomyosis 05/14/2021   ASCUS of cervix with negative high risk HPV 04/23/2021   Iron deficiency anemia due to chronic blood loss 04/23/2021   Type 2 diabetes mellitus without complication, without long-term current use of insulin ([place])  05/18/2020   RLS (restless legs syndrome) 05/14/2020   Menorrhagia with regular cycle 10/14/2017   BMI 37.0-37.9, adult 10/03/2017   History of gestational diabetes 10/03/2017    Rosalyn Gess, OTR/L,CLT 05/26/2022, 6:56 PM  [place] at [facility] [address], [place], [place], [address] Phone: [phone]   Fax:  [phone]  Name: COZETTE BRAGGS MRN: [id number] Date of Birth: 1975/09/02

## 2022-05-27 ENCOUNTER — Ambulatory Visit (INDEPENDENT_AMBULATORY_CARE_PROVIDER_SITE_OTHER): Payer: 59 | Admitting: Student

## 2022-05-27 ENCOUNTER — Encounter: Payer: Self-pay | Admitting: Student

## 2022-05-27 ENCOUNTER — Other Ambulatory Visit: Payer: Self-pay

## 2022-05-27 VITALS — BP 130/74 | HR 98

## 2022-05-27 DIAGNOSIS — Z9889 Other specified postprocedural states: Secondary | ICD-10-CM

## 2022-05-27 NOTE — Progress Notes (Signed)
Patient is a 46 year old female with history of right-sided simple mastectomy and immediate right-sided breast reconstruction using tissue expander and Flex HD performed on 04/21/2022 by Dr. Marla Roe.  Patient presents to the clinic today for postoperative follow-up.  Patient was last seen in the clinic on 05/21/2022.  At this visit, she reported she was putting out approximately 50 to 60 cc from her drain.  Patient reported that she was scheduled for radiation therapy mapping on 05/24/2022.  On exam, skin was mildly taut but amenable to an additional fill.  There is no overlying erythema.  An old hematoma was noted over the superior pole that seem to be improving, there is some overlying old ecchymosis.  25 cc for a total of 305/535 cc were placed in the right expander.  Patient reported she was going to have radiation for 6 weeks, 5 days a week.  She understood that she will have a delay in her implant exchange.  Today, patient reports she is doing well.  She states that her drainage has been approximately 30 to 35 cc/day for the past few days.  She denies any worsening swelling or bruising.  She reports she has been going to physical therapy to help with the range of motion on her right upper extremity.  Patient also states that she did not have radiation mapping this week due to the drain being in place.  She states that they plan on beginning the radiation mapping next Thursday, 06/03/2022 or whenever the drain is removed.  Patient states she feels the right breast expander is fairly tight and she does not want another expander fill at this time.  Patient denies any other issues or concerns.  Chaperone present on exam.  On exam, patient is sitting upright in no acute distress.  Right breast expander is in place.  There is no overlying erythema or ecchymosis to the right breast.  Incision appears to be intact and healing well.  Breast is soft.  Skin is fairly taut.  There are no signs of infection on  exam.  No significant fluid collections palpated on exam.  There are several suture knots noted to the incision, these were cut and removed.  Patient tolerated well.  Right JP drain is in place, with serosanguineous drainage in the bulb.  Right JP drain was removed without difficulty.  Patient tolerated well.  I discussed with the patient that I would like her to place Vaseline on her incision daily.  I discussed with the patient that I would like her to cover the drain site with gauze for the next 3 to 4 days, and then she can apply Vaseline and gauze over the drain site after that.  Patient expressed understanding.  I instructed the patient to continue compression at all times.  I discussed with the patient that she may transition into a compressive sports bra if she would like.  I also discussed with the patient that she needs to avoid any strenuous activities.  Patient expressed understanding.  I discussed with the patient that I would like her to come to be evaluated in our clinic 1 more time prior to the radiation therapy mapping.  I discussed with her that she should come in next Tuesday.  Patient was in agreement with this plan.  I instructed the patient to call us in the meantime if she has any questions or concerns.  Pictures were obtained of the patient and placed in the chart with the patient's or guardian's permission.

## 2022-06-01 ENCOUNTER — Encounter: Payer: Self-pay | Admitting: Physician Assistant

## 2022-06-01 ENCOUNTER — Ambulatory Visit (INDEPENDENT_AMBULATORY_CARE_PROVIDER_SITE_OTHER): Payer: 59 | Admitting: Physician Assistant

## 2022-06-01 VITALS — BP 113/70 | HR 91

## 2022-06-01 DIAGNOSIS — Z9889 Other specified postprocedural states: Secondary | ICD-10-CM

## 2022-06-01 LAB — COLOGUARD: COLOGUARD: NEGATIVE

## 2022-06-01 MED ORDER — SULFAMETHOXAZOLE-TRIMETHOPRIM 800-160 MG PO TABS: 1.0000 | ORAL_TABLET | Freq: Two times a day (BID) | ORAL | 0 refills | Status: DC

## 2022-06-01 NOTE — Progress Notes (Signed)
This is a 46 year old female with a history of right-sided breast cancer status post mastectomy with immediate right-sided breast reconstruction using tissue expanders and Flex HD on 04/21/2022 by Dr. Tedra Coupe him.  The patient was last seen in office on 05/27/2022.  At that time her drain was removed.  She notes over the last 3 days she has had a low-grade temperature, but overall has been feeling well, she denies any fever today.  She notes that she has some serous drainage from her drain site.  She denies any known redness other than that noted from the tape.  She does note fullness in the right breast.  Chaperone present.  On exam patient resting comfortably in exam bed.  Right breast with minimal redness along the right lateral mastectomy incision.  Bilateral flaps are viable, slight warmth to touch at the right breast, no drainage, incision clean dry and intact.  Fullness noted to the right side.  Photos taken today.  I had the opportunity to discuss the case with Dr. Tedra Coupe him, she was able to aspirate approximately 50 cc of serous fluid from the right breast over the port site.  This was sent for culture.  Given the subjective low-grade fevers and minimal redness we will start the patient on a course of antibiotics, Bactrim has been sent to her pharmacy.  I instructed her to follow-up with her radiation oncologist as she is expected to have mapping done on Thursday.  Would like to see her first thing in our office on Friday morning.  I have instructed both the patient and her husband to return immediately if develop any new or worsening signs or symptoms.  They verbalized understanding and agreement to this plan had no further questions or concerns.

## 2022-06-02 ENCOUNTER — Other Ambulatory Visit: Payer: Self-pay

## 2022-06-02 DIAGNOSIS — C50411 Malignant neoplasm of upper-outer quadrant of right female breast: Secondary | ICD-10-CM | POA: Diagnosis present

## 2022-06-02 DIAGNOSIS — K760 Fatty (change of) liver, not elsewhere classified: Secondary | ICD-10-CM | POA: Diagnosis not present

## 2022-06-02 DIAGNOSIS — Z170 Estrogen receptor positive status [ER+]: Secondary | ICD-10-CM | POA: Diagnosis not present

## 2022-06-02 DIAGNOSIS — Z9889 Other specified postprocedural states: Secondary | ICD-10-CM

## 2022-06-02 DIAGNOSIS — C773 Secondary and unspecified malignant neoplasm of axilla and upper limb lymph nodes: Secondary | ICD-10-CM | POA: Diagnosis not present

## 2022-06-03 ENCOUNTER — Ambulatory Visit: Payer: 59

## 2022-06-04 ENCOUNTER — Encounter: Payer: Self-pay | Admitting: Surgical

## 2022-06-04 ENCOUNTER — Encounter: Payer: Self-pay | Admitting: Internal Medicine

## 2022-06-04 ENCOUNTER — Inpatient Hospital Stay (HOSPITAL_BASED_OUTPATIENT_CLINIC_OR_DEPARTMENT_OTHER): Payer: 59 | Admitting: Internal Medicine

## 2022-06-04 ENCOUNTER — Inpatient Hospital Stay: Payer: 59

## 2022-06-04 ENCOUNTER — Ambulatory Visit (INDEPENDENT_AMBULATORY_CARE_PROVIDER_SITE_OTHER): Payer: 59 | Admitting: Surgical

## 2022-06-04 VITALS — BP 133/73 | HR 92

## 2022-06-04 VITALS — BP 124/75 | HR 96 | Temp 97.8°F | Resp 18 | Wt 212.6 lb

## 2022-06-04 DIAGNOSIS — C50411 Malignant neoplasm of upper-outer quadrant of right female breast: Secondary | ICD-10-CM | POA: Diagnosis not present

## 2022-06-04 DIAGNOSIS — Z170 Estrogen receptor positive status [ER+]: Secondary | ICD-10-CM

## 2022-06-04 DIAGNOSIS — Z9889 Other specified postprocedural states: Secondary | ICD-10-CM

## 2022-06-04 LAB — CBC WITH DIFFERENTIAL/PLATELET
Abs Immature Granulocytes: 0.12 10*3/uL — ABNORMAL HIGH (ref 0.00–0.07)
Basophils Absolute: 0 10*3/uL (ref 0.0–0.1)
Basophils Relative: 1 %
Eosinophils Absolute: 0.2 10*3/uL (ref 0.0–0.5)
Eosinophils Relative: 3 %
HCT: 32.6 % — ABNORMAL LOW (ref 36.0–46.0)
Hemoglobin: 10.6 g/dL — ABNORMAL LOW (ref 12.0–15.0)
Immature Granulocytes: 2 %
Lymphocytes Relative: 22 %
Lymphs Abs: 1.8 10*3/uL (ref 0.7–4.0)
MCH: 28.4 pg (ref 26.0–34.0)
MCHC: 32.5 g/dL (ref 30.0–36.0)
MCV: 87.4 fL (ref 80.0–100.0)
Monocytes Absolute: 0.5 10*3/uL (ref 0.1–1.0)
Monocytes Relative: 6 %
Neutro Abs: 5.6 10*3/uL (ref 1.7–7.7)
Neutrophils Relative %: 66 %
Platelets: 245 10*3/uL (ref 150–400)
RBC: 3.73 MIL/uL — ABNORMAL LOW (ref 3.87–5.11)
RDW: 13.1 % (ref 11.5–15.5)
WBC: 8.2 10*3/uL (ref 4.0–10.5)
nRBC: 0 % (ref 0.0–0.2)

## 2022-06-04 LAB — COMPREHENSIVE METABOLIC PANEL
ALT: 14 U/L (ref 0–44)
AST: 15 U/L (ref 15–41)
Albumin: 3.3 g/dL — ABNORMAL LOW (ref 3.5–5.0)
Alkaline Phosphatase: 60 U/L (ref 38–126)
Anion gap: 9 (ref 5–15)
BUN: 13 mg/dL (ref 6–20)
CO2: 26 mmol/L (ref 22–32)
Calcium: 9 mg/dL (ref 8.9–10.3)
Chloride: 103 mmol/L (ref 98–111)
Creatinine, Ser: 0.91 mg/dL (ref 0.44–1.00)
GFR, Estimated: >60 mL/min (ref >60)
Glucose, Bld: 190 mg/dL — ABNORMAL HIGH (ref 70–99)
Potassium: 3.8 mmol/L (ref 3.5–5.1)
Sodium: 138 mmol/L (ref 135–145)
Total Bilirubin: 0.4 mg/dL (ref 0.3–1.2)
Total Protein: 7.3 g/dL (ref 6.5–8.1)

## 2022-06-04 LAB — MAGNESIUM: Magnesium: 1.9 mg/dL (ref 1.7–2.4)

## 2022-06-04 MED ORDER — TAMOXIFEN CITRATE 20 MG PO TABS: 20.0000 mg | ORAL_TABLET | Freq: Every day | ORAL | 4 refills | Status: DC

## 2022-06-04 MED ORDER — TRAMADOL HCL 50 MG PO TABS: 50.0000 mg | ORAL_TABLET | Freq: Two times a day (BID) | ORAL | 0 refills | Status: AC | PRN

## 2022-06-04 NOTE — Progress Notes (Signed)
Patient is a 46 year old female here for follow-up on her right-sided breast cancer and breast reconstruction.  She currently has tissue expanders in place, these were placed on 04/21/2022 by Dr. Marla Roe.  She was last seen in the office on 06/01/2022 which was 3 days ago.  She had redness to the right breast and culture of fluid that was aspirated from the right breast pocket was positive for staph aureus, pansensitive.  She is currently on Bactrim.  She has had some mild improvement, but reports ongoing pressure to the right breast.  She reports her appetite has been normal, she is drinking and eating normally.  She is urinating normally.  She denies any infectious symptoms at this time.  Her main concern is the pressure to the right breast.  Of note she is meeting with oncology today to discuss adjuvant chemotherapy.  She is not currently undergoing chemotherapy at this time.  Of note she is going to need radiation to the right breast, she was initially scheduled for mapping of this but this was postponed due to her having the postoperative drain still in place.  Chaperone present on exam On exam right breast incision is intact, she does have an area of fluid collection that is present with palpation.  She has an area of thinning of the right lateral mastectomy incision and some underlying fluid.  There is surrounding erythema and warmth present.  She has some irritation from tape around the JP drain insertion site, this area seems to be healing well.  A/P:  46 year old female with right breast expander in place for reconstruction.  She subsequently developed an infection of the right breast, has been on oral antibiotics for 3 days, minimal improvement.  30 cc of cloudy fluid was aspirated from the right breast.   Recommend continuing with antibiotic until next week follow-up.  I discussed patient's case with Dr. Marla Roe and she agrees with follow-up next week for evaluation.  We discussed  that we may need to remove the expander so she can undergo radiation and chemotherapy.  Pictures were obtained of the patient and placed in the chart with the patient's or guardian's permission.  All of her questions and her husband's questions were answered to their content.  I discussed with the patient that I noticed an area of thinning skin on the right lateral breast which may spontaneously open this weekend and to cover the area with gauze if this happens.  We discussed notifying us if she has any changes over the weekend and reasons to seek evaluation in the emergency room.  Patient was understanding and agreeable.

## 2022-06-04 NOTE — Progress Notes (Signed)
[name] NOTE  Patient Care Team: Latanya Maudlin, NP as PCP - General (Family Medicine) Daiva Huge, RN as Oncology Nurse Navigator Cammie Sickle, [name]  CHIEF COMPLAINTS/PURPOSE OF CONSULTATION: Breast cancer  Oncology History Overview Note  MAY 2023-    Targeted ultrasound is performed, showing a 2.9 x 1.9 x 2.0 cm irregular hypoechoic mass right breast 10 o'clock position 6 cm from nipple at the site of palpable concern.   There is an abnormal 8 mm thickened right axillary lymph node.   There is skin thickening overlying the right breast. ------------------  A. BREAST, RIGHT AT 10:00, 6 CM FROM THE NIPPLE; ULTRASOUND-GUIDED CORE  NEEDLE BIOPSY:  - INVASIVE MAMMARY CARCINOMA, NO SPECIAL TYPE.   Size of invasive carcinoma: 11 mm in this sample  Histologic grade of invasive carcinoma: Grade 2                       Glandular/tubular differentiation score: 3                       Nuclear pleomorphism score: 2                       Mitotic rate score: 1                       Total score: 6  Ductal carcinoma in situ: Present, intermediate grade  Lymphovascular invasion: Not identified   B. LYMPH NODE, RIGHT AXILLARY; ULTRASOUND-GUIDED CORE NEEDLE BIOPSY:  - 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 IN GREATEST  EXTENT.   Comment:  The malignancy in the primary breast lesion appears morphologically  different from tumor in the lymph node sampling, with tumor present in  lymph node more suggestive of a histologic grade 3, with significantly  increased mitotic activity and pleomorphism. Due to the discrepancy in  these morphologic patterns, ER/PR/HER2 immunohistochemistry will be  performed on both blocks A1 and B1, with reflex to [place] for HER2 2+. The  results will be reported in an addendum.   ADDENDUM:  CASE SUMMARY: BREAST BIOMARKER TESTS - A - BREAST, RIGHT AT 10:00  Estrogen Receptor  (ER) Status: POSITIVE          Percentage of cells with nuclear positivity: 71-80%          Average intensity of staining: Strong   Progesterone Receptor (PgR) Status: POSITIVE          Percentage of cells with nuclear positivity: 81-90%          Average intensity of staining: Strong   HER2 (by immunohistochemistry): POSITIVE (Score 3+)       Percentage of cells with uniform intense complete membrane  staining: 61-70%  HER2 displays a heterogeneous staining pattern, with areas showing a  strong 3+ staining pattern, and other regions with complete absence (0+)  of staining.   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  Discussed with Dr. Sedalia Muta tumor heterogeneity. BREAST MRI- JUNE 2023- The patient's known malignancy in the upper outer quadrant of the right breast measures 3.8 x 2.8 x 3.6 cm. Numerous other suspicious masses are identified in the right breast as above involving the upper outer and upper inner quadrants, located both anterior and posterior to the known malignancy.  Numerous enhancing foci in the skin as above are worrisome for skin metastases ; Bx proven. Biopsy proven metastatic node in the right axilla.  No evidence of malignancy in the left breast;  Two mildly prominent right internal mammary nodes were not FDG avid on recent PET-CT imaging.  Currently on neoadjuvant chemotherapy- TCH  plus P x6 cycles;  MUGA scan May 31st, 2023- -61%.    # June 7th, 2023- NEO-ADJUVANT CHEMO- TCH+P x6 cycles- s/p Mastectomy [Dr.Byrnett]-  ypT2 [58m]; ypN0.   # Genetic counseling s/p- NEG for any deleterious mutations.     Carcinoma of upper-outer quadrant of right breast in female, estrogen receptor positive ([place]  10/26/2021 Initial Diagnosis   Carcinoma of upper-outer quadrant of right breast in female, estrogen receptor positive ([facility]   10/26/2021 Cancer Staging   Staging form: Breast, AJCC 8th Edition - Clinical: Stage IIIA (cT4d, cN1, cM0, G2, ER+, PR+, HER2+) - Signed by BCammie Sickle MD on 06/04/2022 Histologic grading system: 3 grade system   11/10/2021 - 03/01/2022 Chemotherapy   Patient is on Treatment Plan : BREAST  Docetaxel + Carboplatin + Trastuzumab + Pertuzumab  (TCHP) q21d      11/11/2021 - 01/13/2022 Chemotherapy   Patient is on Treatment Plan : BREAST  Docetaxel + Carboplatin + Trastuzumab + Pertuzumab  (TCHP) q21d       Genetic Testing   Negative genetic testing. No pathogenic variants identified on the [facility] panel. The report date is 11/29/2021.  The CancerNext-Expanded + RNAinsight gene panel offered by [employer] includes sequencing and rearrangement analysis for the following 77 genes: IP, ALK, APC*, ATM*, AXIN2, BAP1, BARD1, BLM, BMPR1A, BRCA1*, BRCA2*, BRIP1*, CDC73, CDH1*,CDK4, CDKN1B, CDKN2A, CHEK2*, CTNNA1, DICER1, FANCC, FH, FLCN, GALNT12, KIF1B, LZTR1, MAX, MEN1, MET, MLH1*, MSH2*, MSH3, MSH6*, MUTYH*, NBN, NF1*, NF2, NTHL1, PALB2*, PHOX2B, PMS2*, POT1, PRKAR1A, PTCH1, PTEN*, RAD51C*, RAD51D*,RB1, RECQL, RET, SDHA, SDHAF2, SDHB, SDHC, SDHD, SMAD4, SMARCA4, SMARCB1, SMARCE1, STK11, SUFU, TMEM127, TP53*,TSC1, TSC2, VHL and XRCC2 (sequencing and deletion/duplication); EGFR, EGLN1, HOXB13, KIT, MITF, PDGFRA, POLD1 and POLE (sequencing only); EPCAM and GREM1 (deletion/duplication only).   05/20/2022 -  Chemotherapy   Patient is  on Treatment Plan : BREAST ADO-Trastuzumab Emtansine ([place]) q21d       HISTORY OF PRESENTING ILLNESS: Patient is ambulating independently.  Accompanied by her husband.   CClaudean Severance89y.o.  female right breast TRIPLE POSITIVE with T4- Stage III currently s/p neoadjuvant TCH plus P chemotherapy #6 -s/p mastectomy-with expanders is here for follow-up.  Unfortunately interim patient had infection of her mastectomy incision currently on antibiotics.  Currently being followed by plastic surgeon.  Patient continues to complains of mild tingling and numbness in extremities.  Chronic mild fatigue. No skin rash.    Review of Systems  Constitutional:  Positive for malaise/fatigue. Negative for chills, diaphoresis, fever and weight loss.  HENT:  Negative for nosebleeds and sore  throat.   Eyes:  Negative for double vision.  Respiratory:  Negative for cough, hemoptysis, sputum production, shortness of breath and wheezing.   Cardiovascular:  Negative for chest pain, palpitations, orthopnea and leg swelling.  Gastrointestinal:  Negative for abdominal pain, blood in stool, constipation, diarrhea, heartburn, melena, nausea and vomiting.  Genitourinary:  Negative for dysuria, frequency and urgency.  Musculoskeletal:  Positive for myalgias. Negative for back pain and joint pain.  Skin: Negative.  Negative for itching and rash.  Neurological:  Negative for dizziness, tingling, focal weakness, weakness and headaches.  Endo/Heme/Allergies:  Does not bruise/bleed easily.  Psychiatric/Behavioral:  Negative for depression. The patient is not nervous/anxious and does not have insomnia.      MEDICAL HISTORY:  Past Medical History:  Diagnosis Date   Anemia    Carcinoma of upper-outer quadrant of right breast in female, estrogen receptor positive ([employer]) 10/26/2021   Diabetes mellitus without complication (HCC)    Family history of adverse reaction to anesthesia    grandmother had issues but doesnt know  what the issue was, she was older.   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 Axilla Butterfly hydro clip-MACRO METASTATIC MAMMARY CARCINOMA   BREAST BIOPSY Right 04/21/2022   [place] RT PLC BREAST LOC DEV   1ST LESION  INC US GUIDE 04/21/2022 [facility]   BREAST RECONSTRUCTION WITH PLACEMENT OF TISSUE EXPANDER AND FLEX HD (ACELLULAR HYDRATED DERMIS) Right 04/21/2022   Procedure: RIGHT BREAST RECONSTRUCTION WITH PLACEMENT OF TISSUE EXPANDER AND FLEX HD (ACELLULAR HYDRATED DERMIS);  Surgeon: Wallace Going, DO;  Location: [facility];  Service: Plastics;  Laterality: Right;   CESAREAN SECTION  2005/2008   DILATION AND CURETTAGE OF UTERUS  2003   PORTACATH PLACEMENT Left 11/09/2021   Procedure: INSERTION PORT-A-CATH;  Surgeon: Robert Bellow, MD;  Location: [facility];  Service: General;  Laterality: Left;   SIMPLE MASTECTOMY WITH AXILLARY SENTINEL NODE BIOPSY Right 04/21/2022   Procedure: SIMPLE MASTECTOMY WITH AXILLARY SENTINEL NODE BIOPSY;  Surgeon: Robert Bellow, MD;  Location: [facility];  Service: General;  Laterality: Right;  wire localization of lymph node   SKIN BIOPSY Right 11/09/2021   Procedure: PUNCH BIOPSY SKIN, TATTOO LYMPH NODE RIGHT AXILLA;  Surgeon: Robert Bellow, MD;  Location: [facility];  Service: General;  Laterality: Right;   WISDOM TOOTH EXTRACTION      SOCIAL HISTORY: Social History   Socioeconomic History   Marital status: Married    Spouse name: Jaci Standard   Number of children: 2   Years of education: Not on file   Highest education level: Not on file  Occupational History   Occupation: and Optometrist  Tobacco Use   Smoking status: Never   Smokeless tobacco: Never   Vaping Use   Vaping Use: Never used  Substance and Sexual Activity   Alcohol use: Never   Drug use: Never   Sexual activity: Yes    Birth control/protection: Other-see comments, Surgical    Comment: vasectomy  Other Topics Concern   Not on file  Social History Narrative   Pre-school teacher; in [place]; 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    Social Determinants of Health   Financial Resource Strain: Not on file  Food Insecurity:  Not on file  Transportation Needs: Not on file  Physical Activity: Inactive (09/26/2017)   Exercise Vital Sign    Days of Exercise per Week: 0 days    Minutes of Exercise per Session: 0 min  Stress: Not on file  Social Connections: Not on file  Intimate Partner Violence: Not on file    FAMILY HISTORY: Family History  Problem Relation Age of Onset   Ovarian cysts Mother    Migraines Mother    Melanoma Mother        on leg   Hyperlipidemia Father    Diabetes Maternal Aunt    Stomach cancer Paternal Aunt    Diabetes Maternal Grandfather    Lymphoma Cousin 17   Breast cancer Neg Hx     ALLERGIES:  is allergic to metformin.  MEDICATIONS:  Current Outpatient Medications  Medication Sig Dispense Refill   acetaminophen (TYLENOL) 325 MG tablet Take 2 tablets (650 mg total) by mouth every 6 (six) hours as needed for mild pain (or Fever >/= 101).     ascorbic acid (VITAMIN C) 500 MG tablet Take 1 tablet (500 mg total) by mouth daily. 30 tablet    calcium-vitamin D (OSCAL WITH D) 500-5 MG-MCG tablet Take 1 tablet by mouth 2 (two) times daily. 60 tablet 2   Cyanocobalamin (VITAMIN B-12 PO) Take 1 tablet by mouth daily.     ferrous sulfate 325 (65 FE) MG tablet Take 325 mg by mouth 2 (two) times daily with a meal.     lidocaine-prilocaine (EMLA) cream Apply on the port. 30 -45 min  prior to port access. 30 g 3   Magnesium Cl-Calcium Carbonate (SLOW MAGNESIUM/CALCIUM) 70-117 MG TBEC Take 1 tablet by mouth 2 (two) times daily. 60 tablet 6   Multiple Vitamin  (MULTIVITAMIN WITH MINERALS) TABS tablet Take 1 tablet by mouth daily.     mupirocin ointment (BACTROBAN) 2 % Apply 1 Application topically 2 (two) times daily. 30 g 0   ondansetron (ZOFRAN) 4 MG tablet Take 1 tablet (4 mg total) by mouth every 8 (eight) hours as needed for up to 20 doses for nausea or vomiting. 20 tablet 0   ondansetron (ZOFRAN-ODT) 8 MG disintegrating tablet Take 8 mg by mouth every 8 (eight) hours as needed for nausea or vomiting.     ONETOUCH VERIO test strip SMARTSIG:1 Each Via Meter Daily PRN     potassium chloride (KLOR-CON) 20 MEQ packet Take 20 mEq by mouth 2 (two) times daily. (Patient taking differently: Take 20 mEq by mouth daily.) 60 packet 3   prochlorperazine (COMPAZINE) 10 MG tablet Take 1 tablet (10 mg total) by mouth every 6 (six) hours as needed for nausea or vomiting. 40 tablet 1   sulfamethoxazole-trimethoprim (BACTRIM DS) 800-160 MG tablet Take 1 tablet by mouth 2 (two) times daily. 14 tablet 0   tamoxifen (NOLVADEX) 20 MG tablet Take 1 tablet (20 mg total) by mouth daily. 30 tablet 4   traMADol (ULTRAM) 50 MG tablet Take 1 tablet (50 mg total) by mouth every 12 (twelve) hours as needed for up to 3 days. 6 tablet 0   No current facility-administered medications for this visit.      Marland Kitchen  PHYSICAL EXAMINATION: ECOG PERFORMANCE STATUS: 0 - Asymptomatic  Vitals:   06/04/22 1447  BP: 124/75  Pulse: 96  Resp: 18  Temp: 97.8 F (36.6 C)     Filed Weights   06/04/22 1447  Weight: 212 lb 9.6 oz (96.4 kg)  Right breast-10:00 vague 3 to 4 cm mass noted-with skin thickening/ ?  Biopsy changes.  No nipple changes no erythema.   Physical Exam Vitals and nursing note reviewed.  HENT:     Head: Normocephalic and atraumatic.     Mouth/Throat:     Pharynx: Oropharynx is clear.  Eyes:     Extraocular Movements: Extraocular movements intact.     Pupils: Pupils are equal, round, and reactive to light.  Cardiovascular:     Rate and Rhythm: Normal rate  and regular rhythm.  Pulmonary:     Comments: Decreased breath sounds bilaterally.  Abdominal:     Palpations: Abdomen is soft.  Musculoskeletal:        General: Normal range of motion.     Cervical back: Normal range of motion.  Skin:    General: Skin is warm.  Neurological:     General: No focal deficit present.     Mental Status: She is alert and oriented to person, place, and time.  Psychiatric:        Behavior: Behavior normal.        Judgment: Judgment normal.      LABORATORY DATA:  I have reviewed the data as listed Lab Results  Component Value Date   WBC 8.2 06/04/2022   HGB 10.6 (L) 06/04/2022   HCT 32.6 (L) 06/04/2022   MCV 87.4 06/04/2022   PLT 245 06/04/2022   Recent Labs    12/27/21 2105 12/28/21 0329 04/02/22 1035 04/15/22 0922 06/04/22 1436  NA 134*   < > 140 139 138  K 3.6   < > 3.4* 3.4* 3.8  CL 104   < > 107 107 103  CO2 22   < > _0 GLUCOSE 192*   < > 192* 149* 190*  BUN 16   < > _1 CREATININE 0.93   < > 0.73 0.62 0.91  CALCIUM 8.6*   < > 8.3* 8.7* 9.0  GFRNONAA >60   < > >60 >60 >60  PROT 7.0   < > 5.9* 6.1* 7.3  ALBUMIN 3.7   < > 2.9* 3.1* 3.3*  AST 42*   < > _2 ALT 51*   < > _3 ALKPHOS 70   < > 46 44 60  BILITOT 1.1   < > 0.4 0.6 0.4  BILIDIR 0.2  --   --   --   --   IBILI 0.9  --   --   --   --    < > = values in this interval not displayed.    RADIOGRAPHIC STUDIES: I have personally reviewed the radiological images as listed and agreed with the findings in the report. No results found.  ASSESSMENT & PLAN:   Carcinoma of upper-outer quadrant of right breast in female, estrogen receptor positive ([place]) # MAY 2023- STAGE III-T4N1 ER/PR positive HER2 POSITIVE right breast cancer; LN-positive-ER/PR positive her-2 NEGATIVE; # Currently s/p neoadjuvant chemotherapy- TCH plus P x 6 cycles- ypT2 [2m]; ypN0.  Given partial response/involvement of the breast skin/multifocal disease-s/p evaluation with Dr.  CDonella Stade  Awaiting to start radiation; planned sim on 1/4.   # Also discussed regarding endocrine therapy-tamoxifen. Discussed the role of anti-hormonal therapy mechanism of action; since patient is premenopausal recommend tamoxifen.  I would recommend tamoxifen for 10  years.  Long discussion regarding the potential adverse events on tamoxifen including but not limited to hot flashes, mood swings, thromboembolic  events strokes and also small risk of uterine cancers.  Also discussed the drug interaction with older SSRI.   Discuss OFS; Zometa every 6 months for 3 years.  As per the NCCN guidelines recommend change in subsequent therapy to Ado-trastuzumab emtansine alone for 14 cycles.  Discussed the potential side effects including but not limited to liver toxicity neuropathy etc. patient not a candidate for-clinical with TDM 1+Tucatinib.  Patient will start TDM-1 post 12 weeks of surgery  # Mastectomy site infection/cellulitis currently on-antibiotics as per plastic surgery.   #Incidental findings on Imaging  CT scan OCT 2023: Enlarged heterogeneous uterus with poorly defined endometrial complex.Hepatic steatosis. I reviewed/discussed/counseled the patient.  This will need to be followed up later.  We to discuss at tumor conference.   MUGA scan May 31st, 2023- -61%.   # PN G-1-2 sec to Taxotere- monitor for now. STABLE  # PV access: port flush q 2 months; next visit.   # DISPOSITION: # follow up in 4 weeks- MD;port flush-labs- cbc/cmp- Dr.B    All questions were answered. The patient/family knows to call the clinic with any problems, questions or concerns.       Cammie Sickle, MD 06/04/2022 4:49 PM

## 2022-06-04 NOTE — Assessment & Plan Note (Addendum)
#  MAY 2023- STAGE III-T4N1 ER/PR positive HER2 POSITIVE right breast cancer; LN-positive-ER/PR positive her-2 NEGATIVE; # Currently s/p neoadjuvant chemotherapy- TCH plus P x 6 cycles- ypT2 [16m]; ypN0.  Given partial response/involvement of the breast skin/multifocal disease-s/p evaluation with Dr. CDonella Stade  Awaiting to start radiation; planned sim on 1/4.   # Also discussed regarding endocrine therapy-tamoxifen. Discussed the role of anti-hormonal therapy mechanism of action; since patient is premenopausal recommend tamoxifen.  I would recommend tamoxifen for 10  years.  Long discussion regarding the potential adverse events on tamoxifen including but not limited to hot flashes, mood swings, thromboembolic events strokes and also small risk of uterine cancers.  Also discussed the drug interaction with older SSRI.   Discuss OFS; Zometa every 6 months for 3 years.  As per the NCCN guidelines recommend change in subsequent therapy to Ado-trastuzumab emtansine alone for 14 cycles.  Discussed the potential side effects including but not limited to liver toxicity neuropathy etc. patient not a candidate for-clinical with TDM 1+Tucatinib.  Patient will start TDM-1 post 12 weeks of surgery  # Mastectomy site infection/cellulitis currently on-antibiotics as per plastic surgery.   #Incidental findings on Imaging  CT scan OCT 2023: Enlarged heterogeneous uterus with poorly defined endometrial complex.Hepatic steatosis. I reviewed/discussed/counseled the patient.  This will need to be followed up later.  We to discuss at tumor conference.   MUGA scan May 31st, 2023- -61%.   # PN G-1-2 sec to Taxotere- monitor for now. STABLE  # PV access: port flush q 2 months; next visit.   # DISPOSITION: # follow up in 4 weeks- MD;port flush-labs- cbc/cmp- Dr.B

## 2022-06-04 NOTE — H&P (View-Only) (Signed)
Patient is a 46 year old female here for follow-up on her right-sided breast cancer and breast reconstruction.  She currently has tissue expanders in place, these were placed on 04/21/2022 by Dr. Marla Roe.  She was last seen in the office on 06/01/2022 which was 3 days ago.  She had redness to the right breast and culture of fluid that was aspirated from the right breast pocket was positive for staph aureus, pansensitive.  She is currently on Bactrim.  She has had some mild improvement, but reports ongoing pressure to the right breast.  She reports her appetite has been normal, she is drinking and eating normally.  She is urinating normally.  She denies any infectious symptoms at this time.  Her main concern is the pressure to the right breast.  Of note she is meeting with oncology today to discuss adjuvant chemotherapy.  She is not currently undergoing chemotherapy at this time.  Of note she is going to need radiation to the right breast, she was initially scheduled for mapping of this but this was postponed due to her having the postoperative drain still in place.  Chaperone present on exam On exam right breast incision is intact, she does have an area of fluid collection that is present with palpation.  She has an area of thinning of the right lateral mastectomy incision and some underlying fluid.  There is surrounding erythema and warmth present.  She has some irritation from tape around the JP drain insertion site, this area seems to be healing well.  A/P:  46 year old female with right breast expander in place for reconstruction.  She subsequently developed an infection of the right breast, has been on oral antibiotics for 3 days, minimal improvement.  30 cc of cloudy fluid was aspirated from the right breast.   Recommend continuing with antibiotic until next week follow-up.  I discussed patient's case with Dr. Marla Roe and she agrees with follow-up next week for evaluation.  We discussed  that we may need to remove the expander so she can undergo radiation and chemotherapy.  Pictures were obtained of the patient and placed in the chart with the patient's or guardian's permission.  All of her questions and her husband's questions were answered to their content.  I discussed with the patient that I noticed an area of thinning skin on the right lateral breast which may spontaneously open this weekend and to cover the area with gauze if this happens.  We discussed notifying us if she has any changes over the weekend and reasons to seek evaluation in the emergency room.  Patient was understanding and agreeable.

## 2022-06-04 NOTE — Progress Notes (Signed)
Pt here for follow up. Pt currently on Bactrim for staff infection at tissue expander site.

## 2022-06-05 ENCOUNTER — Other Ambulatory Visit: Payer: Self-pay

## 2022-06-07 ENCOUNTER — Encounter: Payer: Self-pay | Admitting: Plastic Surgery

## 2022-06-07 LAB — AEROBIC/ANAEROBIC CULTURE W GRAM STAIN (SURGICAL/DEEP WOUND)

## 2022-06-07 NOTE — Anesthesia Preprocedure Evaluation (Addendum)
Anesthesia Evaluation  Patient identified by MRN, date of birth, ID band Patient awake    Reviewed: Allergy & Precautions, H&P , NPO status , Patient's Chart, lab work & pertinent test results  History of Anesthesia Complications Negative for: history of anesthetic complications  Airway Mallampati: II  TM Distance: >3 FB Neck ROM: Full    Dental no notable dental hx. (+) Teeth Intact, Dental Advisory Given   Pulmonary neg pulmonary ROS, neg sleep apnea, neg COPD, Patient abstained from smoking.Not current smoker   Pulmonary exam normal breath sounds clear to auscultation       Cardiovascular Exercise Tolerance: Good METS(-) hypertension(-) CAD and (-) Past MI negative cardio ROS Normal cardiovascular exam(-) dysrhythmias  Rhythm:Regular Rate:Normal - Systolic murmurs    Neuro/Psych negative neurological ROS  negative psych ROS   GI/Hepatic negative GI ROS, Neg liver ROS,neg GERD  ,,(+)     (-) substance abuse    Endo/Other  negative endocrine ROSdiabetes    Renal/GU negative Renal ROS  negative genitourinary   Musculoskeletal negative musculoskeletal ROS (+)    Abdominal  (+) + obese  Peds negative pediatric ROS (+)  Hematology negative hematology ROS (+) Blood dyscrasia, anemia   Anesthesia Other Findings Past Medical History: 10/26/2021: Carcinoma of upper-outer quadrant of right breast in  female, estrogen receptor positive ([place]) Diabetes mellitus without complication (HCC) Neuromuscular disorder ([place])  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 history of chemotherapy  Reproductive/Obstetrics negative OB ROS                             Anesthesia Physical Anesthesia Plan  ASA: 3  Anesthesia Plan: General   Post-op Pain Management: Tylenol PO (pre-op)* and Celebrex PO (pre-op)*   Induction: Intravenous  PONV Risk Score and Plan: 4 or greater and Ondansetron, Dexamethasone and  Midazolam  Airway Management Planned: Oral ETT and LMA  Additional Equipment: None  Intra-op Plan:   Post-operative Plan: Extubation in OR  Informed Consent: I have reviewed the patients History and Physical, chart, labs and discussed the procedure including the risks, benefits and alternatives for the proposed anesthesia with the patient or authorized representative who has indicated his/her understanding and acceptance.     Dental advisory given  Plan Discussed with: CRNA and Anesthesiologist  Anesthesia Plan Comments:         Anesthesia Quick Evaluation

## 2022-06-08 ENCOUNTER — Other Ambulatory Visit: Payer: Self-pay | Admitting: Physician Assistant

## 2022-06-08 ENCOUNTER — Inpatient Hospital Stay (HOSPITAL_COMMUNITY): Payer: 59 | Admitting: Anesthesiology

## 2022-06-08 ENCOUNTER — Encounter: Payer: 59 | Admitting: Surgical

## 2022-06-08 ENCOUNTER — Ambulatory Visit (HOSPITAL_COMMUNITY)
Admission: AD | Admit: 2022-06-08 | Discharge: 2022-06-08 | Disposition: A | Discharge status: Home / Self Care | Payer: 59 | Source: Ambulatory Visit | Attending: Plastic Surgery | Admitting: Plastic Surgery

## 2022-06-08 ENCOUNTER — Encounter (HOSPITAL_COMMUNITY): Payer: Self-pay | Admitting: Plastic Surgery

## 2022-06-08 ENCOUNTER — Encounter (HOSPITAL_COMMUNITY)
Admission: AD | Disposition: A | Discharge status: Home / Self Care | Payer: Self-pay | Source: Ambulatory Visit | Attending: Plastic Surgery

## 2022-06-08 ENCOUNTER — Other Ambulatory Visit: Payer: Self-pay

## 2022-06-08 ENCOUNTER — Telehealth: Payer: Self-pay | Admitting: *Deleted

## 2022-06-08 DIAGNOSIS — D649 Anemia, unspecified: Secondary | ICD-10-CM | POA: Insufficient documentation

## 2022-06-08 DIAGNOSIS — Z853 Personal history of malignant neoplasm of breast: Secondary | ICD-10-CM | POA: Insufficient documentation

## 2022-06-08 DIAGNOSIS — L7634 Postprocedural seroma of skin and subcutaneous tissue following other procedure: Secondary | ICD-10-CM | POA: Diagnosis not present

## 2022-06-08 DIAGNOSIS — Z9221 Personal history of antineoplastic chemotherapy: Secondary | ICD-10-CM | POA: Insufficient documentation

## 2022-06-08 DIAGNOSIS — Z45811 Encounter for adjustment or removal of right breast implant: Secondary | ICD-10-CM | POA: Diagnosis not present

## 2022-06-08 DIAGNOSIS — Z9011 Acquired absence of right breast and nipple: Secondary | ICD-10-CM | POA: Diagnosis not present

## 2022-06-08 DIAGNOSIS — E119 Type 2 diabetes mellitus without complications: Secondary | ICD-10-CM | POA: Diagnosis not present

## 2022-06-08 DIAGNOSIS — M96843 Postprocedural seroma of a musculoskeletal structure following other procedure: Secondary | ICD-10-CM | POA: Insufficient documentation

## 2022-06-08 DIAGNOSIS — Z6834 Body mass index (BMI) 34.0-34.9, adult: Secondary | ICD-10-CM | POA: Insufficient documentation

## 2022-06-08 DIAGNOSIS — E669 Obesity, unspecified: Secondary | ICD-10-CM | POA: Insufficient documentation

## 2022-06-08 DIAGNOSIS — Z170 Estrogen receptor positive status [ER+]: Secondary | ICD-10-CM | POA: Diagnosis not present

## 2022-06-08 DIAGNOSIS — C50411 Malignant neoplasm of upper-outer quadrant of right female breast: Secondary | ICD-10-CM | POA: Diagnosis not present

## 2022-06-08 DIAGNOSIS — Y838 Other surgical procedures as the cause of abnormal reaction of the patient, or of later complication, without mention of misadventure at the time of the procedure: Secondary | ICD-10-CM | POA: Diagnosis not present

## 2022-06-08 HISTORY — PX: REMOVAL OF TISSUE EXPANDER AND PLACEMENT OF IMPLANT: SHX6457

## 2022-06-08 LAB — POCT PREGNANCY, URINE: Preg Test, Ur: NEGATIVE

## 2022-06-08 LAB — GLUCOSE, CAPILLARY
Glucose-Capillary: 158 mg/dL — ABNORMAL HIGH (ref 70–99)
Glucose-Capillary: 167 mg/dL — ABNORMAL HIGH (ref 70–99)

## 2022-06-08 SURGERY — REMOVAL, TISSUE EXPANDER, BREAST, WITH IMPLANT INSERTION
Anesthesia: General | Laterality: Right

## 2022-06-08 MED ORDER — DEXAMETHASONE SODIUM PHOSPHATE 10 MG/ML IJ SOLN
INTRAMUSCULAR | Status: DC | PRN
  Administered 2022-06-08: 5 mg via INTRAVENOUS

## 2022-06-08 MED ORDER — 0.9 % SODIUM CHLORIDE (POUR BTL) OPTIME
TOPICAL | Status: DC | PRN
  Administered 2022-06-08 (×2): 1000 mL

## 2022-06-08 MED ORDER — ONDANSETRON HCL 4 MG/2ML IJ SOLN
INTRAMUSCULAR | Status: AC
  Filled 2022-06-08: qty 2

## 2022-06-08 MED ORDER — PROPOFOL 10 MG/ML IV BOLUS
INTRAVENOUS | Status: DC | PRN
  Administered 2022-06-08: 150 mg via INTRAVENOUS

## 2022-06-08 MED ORDER — DEXAMETHASONE SODIUM PHOSPHATE 10 MG/ML IJ SOLN
INTRAMUSCULAR | Status: AC
  Filled 2022-06-08: qty 1

## 2022-06-08 MED ORDER — FENTANYL CITRATE (PF) 100 MCG/2ML IJ SOLN: 25.0000 ug | INTRAMUSCULAR | Status: DC | PRN

## 2022-06-08 MED ORDER — SODIUM CHLORIDE 0.9% FLUSH: 3.0000 mL | INTRAVENOUS | Status: DC | PRN

## 2022-06-08 MED ORDER — CELECOXIB 200 MG PO CAPS
200.0000 mg | ORAL_CAPSULE | Freq: Once | ORAL | Status: AC
  Administered 2022-06-08: 200 mg via ORAL
  Filled 2022-06-08: qty 1

## 2022-06-08 MED ORDER — MIDAZOLAM HCL 2 MG/2ML IJ SOLN
INTRAMUSCULAR | Status: AC
  Filled 2022-06-08: qty 2

## 2022-06-08 MED ORDER — LIDOCAINE 2% (20 MG/ML) 5 ML SYRINGE
INTRAMUSCULAR | Status: DC | PRN
  Administered 2022-06-08: 100 mg via INTRAVENOUS

## 2022-06-08 MED ORDER — HEMOSTATIC AGENTS (NO CHARGE) OPTIME
TOPICAL | Status: DC | PRN
  Administered 2022-06-08 (×3): 1 via TOPICAL

## 2022-06-08 MED ORDER — ORAL CARE MOUTH RINSE: 15.0000 mL | Freq: Once | OROMUCOSAL | Status: AC

## 2022-06-08 MED ORDER — SODIUM CHLORIDE 0.9 % IV SOLN
INTRAVENOUS | Status: DC | PRN
  Administered 2022-06-08: 500 mL

## 2022-06-08 MED ORDER — CHLORHEXIDINE GLUCONATE CLOTH 2 % EX PADS: 6.0000 | MEDICATED_PAD | Freq: Once | CUTANEOUS | Status: DC

## 2022-06-08 MED ORDER — ACETAMINOPHEN 325 MG PO TABS: 650.0000 mg | ORAL_TABLET | ORAL | Status: DC | PRN

## 2022-06-08 MED ORDER — HYDROCODONE-ACETAMINOPHEN 5-325 MG PO TABS: 1.0000 | ORAL_TABLET | Freq: Four times a day (QID) | ORAL | 0 refills | Status: AC | PRN

## 2022-06-08 MED ORDER — SCOPOLAMINE 1 MG/3DAYS TD PT72
MEDICATED_PATCH | TRANSDERMAL | Status: DC | PRN
  Administered 2022-06-08: 1 via TRANSDERMAL

## 2022-06-08 MED ORDER — CHLORHEXIDINE GLUCONATE 0.12 % MT SOLN
15.0000 mL | Freq: Once | OROMUCOSAL | Status: AC
  Administered 2022-06-08: 15 mL via OROMUCOSAL
  Filled 2022-06-08: qty 15

## 2022-06-08 MED ORDER — ONDANSETRON HCL 4 MG/2ML IJ SOLN
INTRAMUSCULAR | Status: DC | PRN
  Administered 2022-06-08: 4 mg via INTRAVENOUS

## 2022-06-08 MED ORDER — MIDAZOLAM HCL 5 MG/5ML IJ SOLN
INTRAMUSCULAR | Status: DC | PRN
  Administered 2022-06-08: 2 mg via INTRAVENOUS

## 2022-06-08 MED ORDER — FENTANYL CITRATE (PF) 250 MCG/5ML IJ SOLN
INTRAMUSCULAR | Status: AC
  Filled 2022-06-08: qty 5

## 2022-06-08 MED ORDER — SODIUM CHLORIDE 0.9 % IV SOLN: 250.0000 mL | INTRAVENOUS | Status: DC | PRN

## 2022-06-08 MED ORDER — VANCOMYCIN HCL IN DEXTROSE 1-5 GM/200ML-% IV SOLN
1000.0000 mg | INTRAVENOUS | Status: AC
  Administered 2022-06-08: 1000 mg via INTRAVENOUS
  Filled 2022-06-08: qty 200

## 2022-06-08 MED ORDER — FENTANYL CITRATE (PF) 250 MCG/5ML IJ SOLN
INTRAMUSCULAR | Status: DC | PRN
  Administered 2022-06-08 (×5): 50 ug via INTRAVENOUS

## 2022-06-08 MED ORDER — DOXYCYCLINE HYCLATE 100 MG PO TBEC: 100.0000 mg | DELAYED_RELEASE_TABLET | Freq: Two times a day (BID) | ORAL | 0 refills | Status: DC

## 2022-06-08 MED ORDER — ACETAMINOPHEN 650 MG RE SUPP: 650.0000 mg | RECTAL | Status: DC | PRN

## 2022-06-08 MED ORDER — SODIUM CHLORIDE 0.9% FLUSH: 3.0000 mL | Freq: Two times a day (BID) | INTRAVENOUS | Status: DC

## 2022-06-08 MED ORDER — LACTATED RINGERS IV SOLN: INTRAVENOUS | Status: DC

## 2022-06-08 MED ORDER — PROPOFOL 10 MG/ML IV BOLUS
INTRAVENOUS | Status: AC
  Filled 2022-06-08: qty 20

## 2022-06-08 MED ORDER — ACETAMINOPHEN 500 MG PO TABS
1000.0000 mg | ORAL_TABLET | Freq: Once | ORAL | Status: AC
  Administered 2022-06-08: 1000 mg via ORAL
  Filled 2022-06-08: qty 2

## 2022-06-08 SURGICAL SUPPLY — 45 items
BAG COUNTER SPONGE SURGICOUNT (BAG) ×1 IMPLANT
BAG DECANTER FOR FLEXI CONT (MISCELLANEOUS) ×1 IMPLANT
BINDER BREAST LRG (GAUZE/BANDAGES/DRESSINGS) IMPLANT
BINDER BREAST XLRG (GAUZE/BANDAGES/DRESSINGS) IMPLANT
BIOPATCH RED 1 DISK 7.0 (GAUZE/BANDAGES/DRESSINGS) ×1 IMPLANT
CANISTER SUCT 3000ML PPV (MISCELLANEOUS) ×1 IMPLANT
CHLORAPREP W/TINT 26 (MISCELLANEOUS) ×1 IMPLANT
COVER SURGICAL LIGHT HANDLE (MISCELLANEOUS) ×1 IMPLANT
DERMABOND ADVANCED .7 DNX12 (GAUZE/BANDAGES/DRESSINGS) ×1 IMPLANT
DRAIN CHANNEL 19F RND (DRAIN) ×1 IMPLANT
DRAPE HALF SHEET 40X57 (DRAPES) ×2 IMPLANT
DRAPE ORTHO SPLIT 77X108 STRL (DRAPES) ×2
DRAPE SURG 17X23 STRL (DRAPES) ×4 IMPLANT
DRAPE SURG ORHT 6 SPLT 77X108 (DRAPES) ×2 IMPLANT
DRAPE WARM FLUID 44X44 (DRAPES) ×1 IMPLANT
DRSG MEPILEX POST OP 4X8 (GAUZE/BANDAGES/DRESSINGS) IMPLANT
DRSG OPSITE 4X5.5 SM (GAUZE/BANDAGES/DRESSINGS) IMPLANT
DRSG OPSITE 6X11 MED (GAUZE/BANDAGES/DRESSINGS) IMPLANT
ELECT BLADE 4.0 EZ CLEAN MEGAD (MISCELLANEOUS) ×1
ELECT REM PT RETURN 9FT ADLT (ELECTROSURGICAL) ×1
ELECTRODE BLDE 4.0 EZ CLN MEGD (MISCELLANEOUS) ×1 IMPLANT
ELECTRODE REM PT RTRN 9FT ADLT (ELECTROSURGICAL) ×1 IMPLANT
EVACUATOR SILICONE 100CC (DRAIN) ×1 IMPLANT
FUNNEL KELLER 2 DISP (MISCELLANEOUS) ×1 IMPLANT
GAUZE PAD ABD 8X10 STRL (GAUZE/BANDAGES/DRESSINGS) ×2 IMPLANT
GAUZE SPONGE 4X4 12PLY STRL (GAUZE/BANDAGES/DRESSINGS) ×1 IMPLANT
GLOVE BIO SURGEON STRL SZ 6.5 (GLOVE) ×1 IMPLANT
GOWN STRL REUS W/ TWL LRG LVL3 (GOWN DISPOSABLE) ×2 IMPLANT
GOWN STRL REUS W/TWL LRG LVL3 (GOWN DISPOSABLE) ×2
HEMOSTAT ARISTA ABSORB 3G PWDR (HEMOSTASIS) IMPLANT
KIT BASIN OR (CUSTOM PROCEDURE TRAY) ×1 IMPLANT
KIT TURNOVER KIT B (KITS) ×1 IMPLANT
NS IRRIG 1000ML POUR BTL (IV SOLUTION) ×2 IMPLANT
PACK GENERAL/GYN (CUSTOM PROCEDURE TRAY) ×1 IMPLANT
PAD ARMBOARD 7.5X6 YLW CONV (MISCELLANEOUS) ×1 IMPLANT
PIN SAFETY STERILE (MISCELLANEOUS) ×1 IMPLANT
SET ASEPTIC TRANSFER (MISCELLANEOUS) IMPLANT
SPONGE T-LAP 18X18 ~~LOC~~+RFID (SPONGE) IMPLANT
SUT PDS AB 3-0 SH 27 (SUTURE) IMPLANT
SUT SILK 3 0 SH 30 (SUTURE) IMPLANT
SUT VICRYL 4-0 PS2 18IN ABS (SUTURE) ×2 IMPLANT
SYR BULB IRRIG 60ML STRL (SYRINGE) IMPLANT
TOWEL GREEN STERILE (TOWEL DISPOSABLE) ×1 IMPLANT
TOWEL GREEN STERILE FF (TOWEL DISPOSABLE) ×1 IMPLANT
TRAY FOLEY MTR SLVR 14FR STAT (SET/KITS/TRAYS/PACK) IMPLANT

## 2022-06-08 NOTE — Transfer of Care (Signed)
Immediate Anesthesia Transfer of Care Note  Patient: Marilyn Page  Procedure(s) Performed: REMOVAL OF TISSUE EXPANDER (Right)  Patient Location: PACU  Anesthesia Type:General  Level of Consciousness: drowsy and patient cooperative  Airway & Oxygen Therapy: Patient Spontanous Breathing and Patient connected to nasal cannula oxygen  Post-op Assessment: Report given to RN and Post -op Vital signs reviewed and stable  Post vital signs: Reviewed and stable  Last Vitals:  Vitals Value Taken Time  BP 110/65 06/08/22 0836  Temp    Pulse 79 06/08/22 0839  Resp 13 06/08/22 0839  SpO2 100 % 06/08/22 0839  Vitals shown include unvalidated device data.  Last Pain:  Vitals:   06/08/22 0614  TempSrc: Oral  PainSc: 0-No pain         Complications: No notable events documented.

## 2022-06-08 NOTE — Anesthesia Postprocedure Evaluation (Signed)
Anesthesia Post Note  Patient: FANNYE MYER  Procedure(s) Performed: REMOVAL OF TISSUE EXPANDER (Right)     Patient location during evaluation: PACU Anesthesia Type: General Level of consciousness: awake and alert Pain management: pain level controlled Vital Signs Assessment: post-procedure vital signs reviewed and stable Respiratory status: spontaneous breathing, nonlabored ventilation, respiratory function stable and patient connected to nasal cannula oxygen Cardiovascular status: blood pressure returned to baseline and stable Postop Assessment: no apparent nausea or vomiting Anesthetic complications: no  No notable events documented.  Last Vitals:  Vitals:   06/08/22 0945 06/08/22 0958  BP: (!) 109/53 (!) 112/56  Pulse: 66 71  Resp: 10 12  Temp:  36.6 C  SpO2: 94% 100%    Last Pain:  Vitals:   06/08/22 0837  TempSrc:   PainSc: Asleep                 Damara Klunder

## 2022-06-08 NOTE — H&P (Signed)
Marilyn Page is an 47 y.o. female.   Chief Complaint: acquired absence of right breast HPI: The patient is a 47 yrs old female here for removal of the right breast expander.  She underwent a right mastectomy with expander placement.  She has a persistent seroma and pain.  Her radiation is delayed due to the seroma.  The decision was made to remove the expander so she can start radiation.  Past Medical History:  Diagnosis Date   Anemia    Carcinoma of upper-outer quadrant of right breast in female, estrogen receptor positive ([place]) 10/26/2021   Diabetes mellitus without complication (HCC)    Family history of adverse reaction to anesthesia    grandmother had issues but doesnt know what the issue was, she was older.   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 Axilla Butterfly hydro clip-MACRO METASTATIC MAMMARY CARCINOMA   BREAST BIOPSY Right 04/21/2022   [place] RT PLC BREAST LOC DEV   1ST LESION  INC US GUIDE 04/21/2022 [facility]   BREAST RECONSTRUCTION WITH PLACEMENT OF TISSUE EXPANDER AND FLEX HD (ACELLULAR HYDRATED DERMIS) Right 04/21/2022   Procedure: RIGHT BREAST RECONSTRUCTION WITH PLACEMENT OF TISSUE EXPANDER AND FLEX HD (ACELLULAR HYDRATED DERMIS);  Surgeon: Wallace Going, DO;  Location: [facility];  Service: Plastics;  Laterality: Right;   CESAREAN SECTION  2005/2008   DILATION AND CURETTAGE OF UTERUS  2003   PORTACATH PLACEMENT Left 11/09/2021   Procedure: INSERTION PORT-A-CATH;  Surgeon: Robert Bellow, MD;  Location: [facility];  Service: General;  Laterality: Left;   SIMPLE MASTECTOMY WITH AXILLARY SENTINEL NODE BIOPSY Right 04/21/2022   Procedure: SIMPLE MASTECTOMY WITH AXILLARY SENTINEL NODE BIOPSY;  Surgeon: Robert Bellow, MD;  Location: [facility];  Service: General;  Laterality: Right;  wire localization of lymph node   SKIN BIOPSY Right 11/09/2021   Procedure: PUNCH BIOPSY SKIN, TATTOO LYMPH NODE RIGHT AXILLA;  Surgeon: Robert Bellow, MD;  Location: [facility];  Service: General;  Laterality: Right;   WISDOM TOOTH EXTRACTION      Family History  Problem Relation Age of Onset   Ovarian cysts Mother    Migraines Mother    Melanoma Mother        on leg   Hyperlipidemia Father    Diabetes Maternal Aunt    Stomach cancer Paternal Aunt    Diabetes Maternal Grandfather    Lymphoma Cousin 17   Breast cancer Neg Hx    Social History:  reports that she has never smoked. She has never used smokeless tobacco. She reports that she does not drink alcohol and does not use drugs.  Allergies:  Allergies  Allergen Reactions   Metformin Other (See Comments)   Oxycodone Nausea And Vomiting    Medications Prior to Admission  Medication Sig Dispense Refill   acetaminophen (TYLENOL) 325 MG tablet Take 2 tablets (650 mg total) by mouth every 6 (six) hours as needed for mild pain (or Fever >/= 101).     ascorbic acid (VITAMIN C) 500 MG tablet Take 1 tablet (500 mg total) by mouth daily. 30 tablet    calcium-vitamin D (OSCAL WITH D) 500-5 MG-MCG tablet Take 1 tablet by mouth 2 (two) times daily. 60 tablet  2   Cyanocobalamin (VITAMIN B-12 PO) Take 1 tablet by mouth daily.     ferrous sulfate 325 (65 FE) MG tablet Take 325 mg by mouth 2 (two) times daily with a meal.     Magnesium Cl-Calcium Carbonate (SLOW MAGNESIUM/CALCIUM) 70-117 MG TBEC Take 1 tablet by mouth 2 (two) times daily. 60 tablet 6   Multiple Vitamin (MULTIVITAMIN WITH MINERALS) TABS tablet Take 1 tablet by mouth daily.     mupirocin ointment (BACTROBAN) 2 % Apply 1 Application topically 2 (two) times daily. 30 g 0   potassium chloride (KLOR-CON) 20 MEQ packet Take 20 mEq by mouth 2 (two) times daily. (Patient taking differently: Take 20 mEq by  mouth daily.) 60 packet 3   sulfamethoxazole-trimethoprim (BACTRIM DS) 800-160 MG tablet Take 1 tablet by mouth 2 (two) times daily. 14 tablet 0   lidocaine-prilocaine (EMLA) cream Apply on the port. 30 -45 min  prior to port access. 30 g 3   ondansetron (ZOFRAN) 4 MG tablet Take 1 tablet (4 mg total) by mouth every 8 (eight) hours as needed for up to 20 doses for nausea or vomiting. 20 tablet 0   ondansetron (ZOFRAN-ODT) 8 MG disintegrating tablet Take 8 mg by mouth every 8 (eight) hours as needed for nausea or vomiting.     ONETOUCH VERIO test strip SMARTSIG:1 Each Via Meter Daily PRN     prochlorperazine (COMPAZINE) 10 MG tablet Take 1 tablet (10 mg total) by mouth every 6 (six) hours as needed for nausea or vomiting. 40 tablet 1   tamoxifen (NOLVADEX) 20 MG tablet Take 1 tablet (20 mg total) by mouth daily. 30 tablet 4    Results for orders placed or performed during the hospital encounter of 06/08/22 (from the past 48 hour(s))  Glucose, capillary     Status: Abnormal   Collection Time: 06/08/22  6:24 AM  Result Value Ref Range   Glucose-Capillary 158 (H) 70 - 99 mg/dL    Comment: Glucose reference range applies only to samples taken after fasting for at least 8 hours.   No results found.  Review of Systems  Constitutional:  Positive for activity change. Negative for appetite change.  HENT: Negative.    Eyes: Negative.   Respiratory: Negative.  Negative for chest tightness and shortness of breath.   Cardiovascular: Negative.   Gastrointestinal: Negative.   Endocrine: Negative.   Genitourinary: Negative.   Musculoskeletal: Negative.   Hematological: Negative.   Psychiatric/Behavioral: Negative.      Blood pressure 104/63, pulse 80, temperature 98 F (36.7 C), temperature source Oral, height '5\' 6"'$  (1.676 m), weight 96.2 kg, SpO2 98 %. Physical Exam Vitals reviewed.  Constitutional:      Appearance: Normal appearance.  HENT:     Head: Normocephalic and atraumatic.   Cardiovascular:     Rate and Rhythm: Normal rate.     Pulses: Normal pulses.  Pulmonary:     Effort: Pulmonary effort is normal.  Abdominal:     Palpations: Abdomen is soft.  Musculoskeletal:        General: Swelling and tenderness present. No deformity or signs of injury.  Skin:    Capillary Refill: Capillary refill takes less than 2 seconds.     Coloration: Skin is not jaundiced.     Findings: No bruising or lesion.  Neurological:     Mental Status: She is alert and oriented to person, place, and time.  Psychiatric:        Mood and Affect: Mood normal.  Behavior: Behavior normal.        Thought Content: Thought content normal.        Judgment: Judgment normal.      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   Pictures in chart.   [place], DO 06/08/2022, 7:05 AM

## 2022-06-08 NOTE — Interval H&P Note (Signed)
History and Physical Interval Note:  06/08/2022 7:05 AM  Marilyn Page  has presented today for surgery, with the diagnosis of Right breast expander.  The various methods of treatment have been discussed with the patient and family. After consideration of risks, benefits and other options for treatment, the patient has consented to  Procedure(s): REMOVAL OF TISSUE EXPANDER (Right) as a surgical intervention.  The patient's history has been reviewed, patient examined, no change in status, stable for surgery.  I have reviewed the patient's chart and labs.  Questions were answered to the patient's satisfaction.     Loel Lofty Karina Lenderman

## 2022-06-08 NOTE — Op Note (Signed)
DATE OF OPERATION: 06/08/2022  LOCATION: [facility]  PREOPERATIVE DIAGNOSIS: Acquired absence right breast with seroma  POSTOPERATIVE DIAGNOSIS: Same  PROCEDURE: Removal of right breast expander and ADM  SURGEON: Audelia Hives, DO  ASSISTANT: Donnamarie Rossetti, PA  EBL: 5 cc  CONDITION: Stable  COMPLICATIONS: None  INDICATION: The patient, Marilyn Page, is a 47 y.o. female born on [birth year], is here for treatment of right breast acquired absence.  She underwent a mastectomy with placement of an expander at the time of the mastectomy.  She had very tight skin flaps.  There was difficulty in expanding her and she developed a seroma.  She is trying to get to radiation and we do not want any further delay.  The decision was made to remove the expander so she could go ahead and get to radiation.  PROCEDURE DETAILS:  The patient was seen prior to surgery and marked.  The IV antibiotics were given. The patient was taken to the operating room and given a general anesthetic. A standard time out was performed and all information was confirmed by those in the room. SCDs were placed.   The patient was prepped and draped in the usual sterile fashion.  A 15 blade was used to excise the 1 cm opening.  The expander was deflated and removed.  The pocket was irrigated with antibiotic solution.  The ADM was completely removed.  Ron Agee was placed and we waited for it to coagulate.  A  #19 drain was placed and secured to the skin with a silk.  The deep layer was then closed with 3-0 PDS.  The skin was closed with 4-0 Monocryl.  Dressings were applied. The patient was allowed to wake up and taken to recovery room in stable condition at the end of the case. The family was notified at the end of the case.   The advanced practice practitioner (APP) assisted throughout the case.  The APP was essential in retraction and counter traction when needed to make the case progress smoothly.  This  retraction and assistance made it possible to see the tissue plans for the procedure.  The assistance was needed for blood control, tissue re-approximation and assisted with closure of the incision site.

## 2022-06-08 NOTE — Telephone Encounter (Signed)
Urgent add on today for removal right breast TE ([id number]) - no auth req

## 2022-06-08 NOTE — Discharge Instructions (Signed)
INSTRUCTIONS FOR AFTER BREAST SURGERY   You will likely have some questions about what to expect following your operation.  The following information will help you and your family understand what to expect when you are discharged from the hospital.  It is important to follow these guidelines to help ensure a smooth recovery and reduce complication.  Postoperative instructions include information on: diet, wound care, medications and physical activity.  AFTER SURGERY Expect to go home after the procedure.  In some cases, you may need to spend one night in the hospital for observation.  DIET Breast surgery does not require a specific diet.  However, the healthier you eat the better your body will heal. It is important to increasing your protein intake.  This means limiting the foods with sugar and carbohydrates.  Focus on vegetables and some meat.  If you have liposuction during your procedure be sure to drink water.  If your urine is bright yellow, then it is concentrated, and you need to drink more water.  As a general rule after surgery, you should have 8 ounces of water every hour while awake.  If you find you are persistently nauseated or unable to take in liquids let us know.  NO TOBACCO USE or EXPOSURE.  This will slow your healing process and lead to a wound.  WOUND CARE Leave the binder on for 3 days . Use fragrance free soap like Dial, Dove or Ivory.   After 3 days you can remove the binder to shower. Once dry apply binder or sports bra. If you have liposuction you will have a soft and spongy dressing (Lipofoam) that helps prevent creases in your skin.  Remove before you shower and then replace it.  It is also available on [employer]. If you have steri-strips / tape directly attached to your skin leave them in place. It is OK to get these wet.   No baths, pools or hot tubs for four weeks. We close your incision to leave the smallest and best-looking scar. No ointment or creams on your incisions  for four weeks.  No Neosporin (Too many skin reactions).  A few weeks after surgery you can use Mederma and start massaging the scar. We ask you to wear your binder or sports bra for the first 6 weeks around the clock, including while sleeping. This provides added comfort and helps reduce the fluid accumulation at the surgery site. NO Ice or heating pads to the operative site.  You have a very high risk of a BURN before you feel the temperature change.  ACTIVITY No heavy lifting until cleared by the doctor.  This usually means no more than a half-gallon of milk.  It is OK to walk and climb stairs. Moving your legs is very important to decrease your risk of a blood clot.  It will also help keep you from getting deconditioned.  Every 1 to 2 hours get up and walk for 5 minutes. This will help with a quicker recovery back to normal.  Let pain be your guide so you don't do too much.  This time is for you to recover.  You will be more comfortable if you sleep and rest with your head elevated either with a few pillows under you or in a recliner.  No stomach sleeping for a three months.  WORK Everyone returns to work at different times. As a rough guide, most people take at least 1 - 2 weeks off prior to returning to work. If   you need documentation for your job, give the forms to the front staff at the clinic.  DRIVING Arrange for someone to bring you home from the hospital after your surgery.  You may be able to drive a few days after surgery but not while taking any narcotics or valium.  BOWEL MOVEMENTS Constipation can occur after anesthesia and while taking pain medication.  It is important to stay ahead for your comfort.  We recommend taking Milk of Magnesia (2 tablespoons; twice a day) while taking the pain pills.  MEDICATIONS You may be prescribed should start after surgery At your preoperative visit for you history and physical you may have been given the following medications: An antibiotic: Start  this medication when you get home and take according to the instructions on the bottle. Zofran 4 mg:  This is to treat nausea and vomiting.  You can take this every 6 hours as needed and only if needed. Valium 2 mg for breast cancer patients: This is for muscle tightness if you have an implant or expander. This will help relax your muscle which also helps with pain control.  This can be taken every 12 hours as needed. Don't drive after taking this medication. Norco (hydrocodone/acetaminophen) 5/325 mg:  This is only to be used after you have taken the Motrin or the Tylenol. Every 8 hours as needed.   Over the counter Medication to take: Ibuprofen (Motrin) 600 mg:  Take this every 6 hours.  If you have additional pain then take 500 mg of the Tylenol every 8 hours.  Only take the Norco after you have tried these two. MiraLAX or Milk of Magnesia: Take this according to the bottle if you take the Norco.  WHEN TO CALL Call your surgeon's office if any of the following occur: Fever 101 degrees F or greater Excessive bleeding or fluid from the incision site. Pain that increases over time without aid from the medications Redness, warmth, or pus draining from incision sites Persistent nausea or inability to take in liquids Severe misshapen area that underwent the operation. About my Jackson-Pratt Bulb Drain  What is a Jackson-Pratt bulb? A Jackson-Pratt is a soft, round device used to collect drainage. It is connected to a long, thin drainage catheter, which is held in place by one or two small stiches near your surgical incision site. When the bulb is squeezed, it forms a vacuum, forcing the drainage to empty into the bulb.  Emptying the Jackson-Pratt bulb- To empty the bulb: 1. Release the plug on the top of the bulb. 2. Pour the bulb's contents into a measuring container which your nurse will provide. 3. Record the time emptied and amount of drainage. Empty the drain(s) as often as your      doctor or nurse recommends.  Date                  Time                    Amount (Drain 1)                 Amount (Drain 2)  _____________________________________________________________________  _____________________________________________________________________  _____________________________________________________________________  _____________________________________________________________________  _____________________________________________________________________  _____________________________________________________________________  _____________________________________________________________________  _____________________________________________________________________  Squeezing the Jackson-Pratt Bulb- To squeeze the bulb: 1. Make sure the plug at the top of the bulb is open. 2. Squeeze the bulb tightly in your fist. You will hear air squeezing from the bulb. 3. Replace the plug while the bulb

## 2022-06-08 NOTE — Anesthesia Procedure Notes (Signed)
Procedure Name: LMA Insertion Date/Time: 06/08/2022 7:37 AM  Performed by: Colin Benton, CRNAPre-anesthesia Checklist: Patient identified, Emergency Drugs available, Suction available and Patient being monitored Patient Re-evaluated:Patient Re-evaluated prior to induction Oxygen Delivery Method: Circle System Utilized Preoxygenation: Pre-oxygenation with 100% oxygen Induction Type: IV induction Ventilation: Mask ventilation without difficulty LMA: LMA inserted LMA Size: 4.0 Number of attempts: 1 Placement Confirmation: positive ETCO2 Tube secured with: Tape Dental Injury: Teeth and Oropharynx as per pre-operative assessment

## 2022-06-09 ENCOUNTER — Telehealth: Payer: Self-pay | Admitting: Plastic Surgery

## 2022-06-09 ENCOUNTER — Inpatient Hospital Stay: Payer: 59 | Admitting: Occupational Therapy

## 2022-06-09 ENCOUNTER — Encounter (HOSPITAL_COMMUNITY): Payer: Self-pay | Admitting: Plastic Surgery

## 2022-06-09 NOTE — Telephone Encounter (Signed)
Drainage was low and making sure that is ok and would like a provider to give her a call back if possible. SX 1.2.24

## 2022-06-09 NOTE — Telephone Encounter (Signed)
I called and spoke with the patient on the phone.  Patient reports she is doing well.  She states that her drain output has been 85 cc over the past 24 hours.  She was wondering if this was normal.  She denies any other issues or concerns.  I discussed with the patient that 85 cc given that she had surgery yesterday is a typical amount.  I discussed with the patient to continue to monitor it and call us if she has any concerns.  Patient expressed understanding.

## 2022-06-10 ENCOUNTER — Ambulatory Visit: Payer: 59

## 2022-06-18 ENCOUNTER — Ambulatory Visit (INDEPENDENT_AMBULATORY_CARE_PROVIDER_SITE_OTHER): Payer: 59 | Admitting: Plastic Surgery

## 2022-06-18 DIAGNOSIS — Z170 Estrogen receptor positive status [ER+]: Secondary | ICD-10-CM

## 2022-06-18 DIAGNOSIS — Z9011 Acquired absence of right breast and nipple: Secondary | ICD-10-CM

## 2022-06-18 DIAGNOSIS — C50411 Malignant neoplasm of upper-outer quadrant of right female breast: Secondary | ICD-10-CM

## 2022-06-18 NOTE — Progress Notes (Signed)
The patient is a 47 year old female here for follow-up after removal of an expander of the right side.  Drain output has been minimal.  I went ahead and removed the drain.  Patient says she felt better almost immediately.  We will plan to see her back in 2 weeks.  She knows to give [place] a call with any questions or concerns.  She is going to get started with radiation in the next 2 weeks.

## 2022-06-22 ENCOUNTER — Other Ambulatory Visit: Payer: Self-pay

## 2022-06-24 ENCOUNTER — Other Ambulatory Visit: Payer: Self-pay | Admitting: *Deleted

## 2022-06-24 ENCOUNTER — Ambulatory Visit: Admission: RE | Admit: 2022-06-24 | Payer: 59 | Source: Ambulatory Visit

## 2022-06-24 ENCOUNTER — Encounter: Payer: Self-pay | Admitting: *Deleted

## 2022-06-24 DIAGNOSIS — C50411 Malignant neoplasm of upper-outer quadrant of right female breast: Secondary | ICD-10-CM

## 2022-06-25 ENCOUNTER — Other Ambulatory Visit: Payer: Self-pay

## 2022-07-01 ENCOUNTER — Ambulatory Visit: Payer: 59

## 2022-07-02 ENCOUNTER — Inpatient Hospital Stay: Payer: 59

## 2022-07-02 ENCOUNTER — Telehealth: Payer: Self-pay

## 2022-07-02 ENCOUNTER — Encounter: Payer: Self-pay | Admitting: Internal Medicine

## 2022-07-02 ENCOUNTER — Inpatient Hospital Stay: Payer: 59 | Attending: Internal Medicine | Admitting: Internal Medicine

## 2022-07-02 ENCOUNTER — Ambulatory Visit (INDEPENDENT_AMBULATORY_CARE_PROVIDER_SITE_OTHER): Payer: 59 | Admitting: Surgical

## 2022-07-02 VITALS — BP 117/75 | HR 79 | Temp 96.7°F | Resp 16 | Wt 216.9 lb

## 2022-07-02 DIAGNOSIS — C50411 Malignant neoplasm of upper-outer quadrant of right female breast: Secondary | ICD-10-CM

## 2022-07-02 DIAGNOSIS — Z170 Estrogen receptor positive status [ER+]: Secondary | ICD-10-CM | POA: Diagnosis not present

## 2022-07-02 DIAGNOSIS — Z95828 Presence of other vascular implants and grafts: Secondary | ICD-10-CM

## 2022-07-02 DIAGNOSIS — Z9221 Personal history of antineoplastic chemotherapy: Secondary | ICD-10-CM | POA: Diagnosis not present

## 2022-07-02 DIAGNOSIS — Z452 Encounter for adjustment and management of vascular access device: Secondary | ICD-10-CM | POA: Diagnosis not present

## 2022-07-02 DIAGNOSIS — Z9889 Other specified postprocedural states: Secondary | ICD-10-CM

## 2022-07-02 DIAGNOSIS — Z79810 Long term (current) use of selective estrogen receptor modulators (SERMs): Secondary | ICD-10-CM | POA: Insufficient documentation

## 2022-07-02 LAB — CBC WITH DIFFERENTIAL/PLATELET
Abs Immature Granulocytes: 0.02 10*3/uL (ref 0.00–0.07)
Basophils Absolute: 0 10*3/uL (ref 0.0–0.1)
Basophils Relative: 1 %
Eosinophils Absolute: 0.2 10*3/uL (ref 0.0–0.5)
Eosinophils Relative: 3 %
HCT: 32.8 % — ABNORMAL LOW (ref 36.0–46.0)
Hemoglobin: 10.6 g/dL — ABNORMAL LOW (ref 12.0–15.0)
Immature Granulocytes: 0 %
Lymphocytes Relative: 33 %
Lymphs Abs: 1.9 10*3/uL (ref 0.7–4.0)
MCH: 27.8 pg (ref 26.0–34.0)
MCHC: 32.3 g/dL (ref 30.0–36.0)
MCV: 86.1 fL (ref 80.0–100.0)
Monocytes Absolute: 0.3 10*3/uL (ref 0.1–1.0)
Monocytes Relative: 6 %
Neutro Abs: 3.3 10*3/uL (ref 1.7–7.7)
Neutrophils Relative %: 57 %
Platelets: 211 10*3/uL (ref 150–400)
RBC: 3.81 MIL/uL — ABNORMAL LOW (ref 3.87–5.11)
RDW: 14.5 % (ref 11.5–15.5)
WBC: 5.7 10*3/uL (ref 4.0–10.5)
nRBC: 0 % (ref 0.0–0.2)

## 2022-07-02 LAB — COMPREHENSIVE METABOLIC PANEL
ALT: 17 U/L (ref 0–44)
AST: 23 U/L (ref 15–41)
Albumin: 3.3 g/dL — ABNORMAL LOW (ref 3.5–5.0)
Alkaline Phosphatase: 55 U/L (ref 38–126)
Anion gap: 9 (ref 5–15)
BUN: 14 mg/dL (ref 6–20)
CO2: 26 mmol/L (ref 22–32)
Calcium: 8.7 mg/dL — ABNORMAL LOW (ref 8.9–10.3)
Chloride: 103 mmol/L (ref 98–111)
Creatinine, Ser: 0.67 mg/dL (ref 0.44–1.00)
GFR, Estimated: >60 mL/min (ref >60)
Glucose, Bld: 202 mg/dL — ABNORMAL HIGH (ref 70–99)
Potassium: 3.4 mmol/L — ABNORMAL LOW (ref 3.5–5.1)
Sodium: 138 mmol/L (ref 135–145)
Total Bilirubin: 0.5 mg/dL (ref 0.3–1.2)
Total Protein: 6.9 g/dL (ref 6.5–8.1)

## 2022-07-02 MED ORDER — LEUPROLIDE ACETATE (3 MONTH) 11.25 MG IM KIT: 11.2500 mg | PACK | INTRAMUSCULAR | Status: DC

## 2022-07-02 MED ORDER — HEPARIN SOD (PORK) LOCK FLUSH 100 UNIT/ML IV SOLN
500.0000 [IU] | Freq: Once | INTRAVENOUS | Status: AC
  Administered 2022-07-02: 500 [IU] via INTRAVENOUS
  Filled 2022-07-02: qty 5

## 2022-07-02 MED ORDER — SODIUM CHLORIDE 0.9% FLUSH
10.0000 mL | Freq: Once | INTRAVENOUS | Status: AC
  Administered 2022-07-02: 10 mL via INTRAVENOUS
  Filled 2022-07-02: qty 10

## 2022-07-02 NOTE — Telephone Encounter (Signed)
Faxed mastectomy supply request to [facility] with confirmed receipt. Will send to scan.

## 2022-07-02 NOTE — Assessment & Plan Note (Addendum)
#  MAY 2023- STAGE III-T4N1 ER/PR positive HER2 POSITIVE right breast cancer; LN-positive-ER/PR positive her-2 NEGATIVE; # Currently s/p neoadjuvant chemotherapy- TCH plus P x 6 cycles- s/p Riht mastectomy-[15th, NOv 2023] ypT2 [22m]; ypN0.  Given partial response/involvement of the breast skin/multifocal disease-s/p evaluation with Dr. CDonella Stade  Awaiting to start radiation; planned sim on 1/29.  Patient currently on tamoxifen [DEC-JAN 2024]; Discussed OFS-will also start the patient on Lupron every 3 months.   # Recommend patient start Ado-trastuzumab emtansine alone for 14 cycles.  Discussed the potential side effects including but not limited to liver toxicity neuropathy etc. Patient will start TDM-1 next week.  MUGA scan May 31st, 2023- -61%.  Plan MUGA scan ASAP  # Discussed the role of Zometa every 6 months for 3 years/on adjuvant basis.  Discussed the benefit Zometa is to decrease skeletal related events Discussed the potential side effects including but not limited to-infusion reaction; hypocalcemia; and Osteo-necrosis of jaw. Recommend ca+Vit D BID.  Will check vitamin D next visit.   # Mastectomy site infection/cellulitis currently on-antibiotics as per plastic surgery.  Awaiting evaluation with plastic surgery today.  # PN G-1-2 sec to Taxotere- monitor for now. Stable.   # PV access: port functional  # DISPOSITION: # follow up in 1 week- MD; port-Kadcyla [new]; no labs; MUGA scan ASAP; Lupron injection- Dr.B

## 2022-07-02 NOTE — Progress Notes (Signed)
Patient now scheduled for XRT SIMS on Monday 07/05/22.  Patient having new bilateral upper leg itching at night and not sure if it is related to Tamoxifen with one of her vitamins.

## 2022-07-02 NOTE — Progress Notes (Signed)
[name] NOTE  Patient Care Team: Latanya Maudlin, NP as PCP - General (Family Medicine) Daiva Huge, RN as Oncology Nurse Navigator Cammie Sickle, [name]  CHIEF COMPLAINTS/PURPOSE OF CONSULTATION: Breast cancer  Oncology History Overview Note  MAY 2023-    Targeted ultrasound is performed, showing a 2.9 x 1.9 x 2.0 cm irregular hypoechoic mass right breast 10 o'clock position 6 cm from nipple at the site of palpable concern.   There is an abnormal 8 mm thickened right axillary lymph node.   There is skin thickening overlying the right breast. ------------------  A. BREAST, RIGHT AT 10:00, 6 CM FROM THE NIPPLE; ULTRASOUND-GUIDED CORE  NEEDLE BIOPSY:  - INVASIVE MAMMARY CARCINOMA, NO SPECIAL TYPE.   Size of invasive carcinoma: 11 mm in this sample  Histologic grade of invasive carcinoma: Grade 2                       Glandular/tubular differentiation score: 3                       Nuclear pleomorphism score: 2                       Mitotic rate score: 1                       Total score: 6  Ductal carcinoma in situ: Present, intermediate grade  Lymphovascular invasion: Not identified   B. LYMPH NODE, RIGHT AXILLARY; ULTRASOUND-GUIDED CORE NEEDLE BIOPSY:  - 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 IN GREATEST  EXTENT.   Comment:  The malignancy in the primary breast lesion appears morphologically  different from tumor in the lymph node sampling, with tumor present in  lymph node more suggestive of a histologic grade 3, with significantly  increased mitotic activity and pleomorphism. Due to the discrepancy in  these morphologic patterns, ER/PR/HER2 immunohistochemistry will be  performed on both blocks A1 and B1, with reflex to [place] for HER2 2+. The  results will be reported in an addendum.   ADDENDUM:  CASE SUMMARY: BREAST BIOMARKER TESTS - A - BREAST, RIGHT AT 10:00  Estrogen Receptor  (ER) Status: POSITIVE          Percentage of cells with nuclear positivity: 71-80%          Average intensity of staining: Strong   Progesterone Receptor (PgR) Status: POSITIVE          Percentage of cells with nuclear positivity: 81-90%          Average intensity of staining: Strong   HER2 (by immunohistochemistry): POSITIVE (Score 3+)       Percentage of cells with uniform intense complete membrane  staining: 61-70%  HER2 displays a heterogeneous staining pattern, with areas showing a  strong 3+ staining pattern, and other regions with complete absence (0+)  of staining.   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  Discussed with Dr. Sedalia Muta tumor heterogeneity. BREAST MRI- JUNE 2023- The patient's known malignancy in the upper outer quadrant of the right breast measures 3.8 x 2.8 x 3.6 cm. Numerous other suspicious masses are identified in the right breast as above involving the upper outer and upper inner quadrants, located both anterior and posterior to the known malignancy.  Numerous enhancing foci in the skin as above are worrisome for skin metastases ; Bx proven. Biopsy proven metastatic node in the right axilla.  No evidence of malignancy in the left breast;  Two mildly prominent right internal mammary nodes were not FDG avid on recent PET-CT imaging.  Currently on neoadjuvant chemotherapy- TCH  plus P x6 cycles;  MUGA scan May 31st, 2023- -61%.    # June 7th, 2023- NEO-ADJUVANT CHEMO- TCH+P x6 cycles- s/p Mastectomy [NOV 15th, 2023] [Dr.Byrnett]-  ypT2 [34m]; ypN0.  # JAN mid, 2024 -TAMOXFEN 20 mg a day; ADD LupronFEB 2nd.2 2024  # FEB 2nd, 2023- Kadcyla q 3 W x14 cycles;    # Genetic counseling s/p- NEG for any deleterious mutations.     Carcinoma of upper-outer quadrant of right breast in female, estrogen receptor positive ([facility]  10/26/2021 Initial Diagnosis   Carcinoma of upper-outer quadrant of right breast in female, estrogen receptor positive ([facility]   10/26/2021 Cancer Staging   Staging form: Breast, AJCC 8th Edition - Clinical: Stage IIIA (cT4d, cN1, cM0, G2, ER+, PR+, HER2+) - Signed by BCammie Sickle MD on 06/04/2022 Histologic grading system: 3 grade system   11/10/2021 - 03/01/2022 Chemotherapy   Patient is on Treatment Plan : BREAST  Docetaxel + Carboplatin + Trastuzumab + Pertuzumab  (TCHP) q21d      11/11/2021 - 01/13/2022 Chemotherapy   Patient is on Treatment Plan : BREAST  Docetaxel + Carboplatin + Trastuzumab + Pertuzumab  (TCHP) q21d       Genetic Testing   Negative genetic testing. No pathogenic variants identified on the [facility] panel. The report date is 11/29/2021.  The CancerNext-Expanded + RNAinsight gene panel offered by [employer] includes sequencing and rearrangement analysis for the following 77 genes: IP, ALK, APC*, ATM*, AXIN2, BAP1, BARD1, BLM, BMPR1A, BRCA1*, BRCA2*, BRIP1*, CDC73, CDH1*,CDK4, CDKN1B, CDKN2A, CHEK2*, CTNNA1, DICER1, FANCC, FH, FLCN, GALNT12, KIF1B, LZTR1, MAX, MEN1, MET, MLH1*, MSH2*, MSH3, MSH6*, MUTYH*, NBN, NF1*, NF2, NTHL1, PALB2*, PHOX2B, PMS2*, POT1, PRKAR1A, PTCH1, PTEN*, RAD51C*, RAD51D*,RB1, RECQL, RET, SDHA, SDHAF2, SDHB, SDHC, SDHD, SMAD4, SMARCA4, SMARCB1, SMARCE1, STK11, SUFU, TMEM127, TP53*,TSC1, TSC2, VHL and XRCC2 (sequencing and deletion/duplication); EGFR, EGLN1, HOXB13, KIT, MITF,  PDGFRA, POLD1 and POLE (sequencing only); EPCAM and GREM1 (deletion/duplication only).   07/09/2022 -  Chemotherapy   Patient is on Treatment Plan : BREAST ADO-Trastuzumab Emtansine (Kadcyla) q21d      HISTORY OF PRESENTING ILLNESS: Patient is ambulating independently.  Accompanied by her husband.   CClaudean Severance89y.o.  female right breast TRIPLE POSITIVE with T4- Stage III currently s/p neoadjuvant TCH plus P chemotherapy #6 -s/p mastectomy-with expanders is here for follow-up.  Unfortunately interim patient had infection of her mastectomy incision currently on antibiotics.  Currently being followed by plastic surgeon.  Patient now scheduled for XRT SIMS on Monday 07/05/22. Patient having new bilateral upper leg itching at night  and not sure if it is related to Tamoxifen with one of her vitamins.   Patient continues to complains of mild tingling and numbness in extremities.  Chronic mild fatigue. No skin rash.    Review of Systems  Constitutional:  Positive for malaise/fatigue. Negative for chills, diaphoresis, fever and weight loss.  HENT:  Negative for nosebleeds and sore throat.   Eyes:  Negative for double vision.  Respiratory:  Negative for cough, hemoptysis, sputum production, shortness of breath and wheezing.   Cardiovascular:  Negative for chest pain, palpitations, orthopnea and leg swelling.  Gastrointestinal:  Negative for abdominal pain, blood in stool, constipation, diarrhea, heartburn, melena, nausea and vomiting.  Genitourinary:  Negative for dysuria, frequency and urgency.  Musculoskeletal:  Positive for myalgias. Negative for back pain and joint pain.  Skin: Negative.  Negative for itching and rash.  Neurological:  Negative for dizziness, tingling, focal weakness, weakness and headaches.  Endo/Heme/Allergies:  Does not bruise/bleed easily.  Psychiatric/Behavioral:  Negative for depression. The patient is not nervous/anxious and does not have insomnia.      MEDICAL  HISTORY:  Past Medical History:  Diagnosis Date   Anemia    Carcinoma of upper-outer quadrant of right breast in female, estrogen receptor positive ([place]) 10/26/2021   Diabetes mellitus without complication (HCC)    Family history of adverse reaction to anesthesia    grandmother had issues but doesnt know what the issue was, she was older.   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 Axilla Butterfly hydro clip-MACRO METASTATIC MAMMARY CARCINOMA   BREAST BIOPSY Right 04/21/2022   [place] RT PLC BREAST LOC DEV   1ST LESION  INC US GUIDE 04/21/2022 [facility]   BREAST RECONSTRUCTION WITH PLACEMENT OF TISSUE EXPANDER AND FLEX HD (ACELLULAR HYDRATED DERMIS) Right 04/21/2022   Procedure: RIGHT BREAST RECONSTRUCTION WITH PLACEMENT OF TISSUE EXPANDER AND FLEX HD (ACELLULAR HYDRATED DERMIS);  Surgeon: Wallace Going, DO;  Location: [facility];  Service: Plastics;  Laterality: Right;   CESAREAN SECTION  2005/2008   DILATION AND CURETTAGE OF UTERUS  2003   PORTACATH PLACEMENT Left 11/09/2021   Procedure: INSERTION PORT-A-CATH;  Surgeon: Robert Bellow, MD;  Location: [facility];  Service: General;  Laterality: Left;   REMOVAL OF TISSUE EXPANDER AND PLACEMENT OF IMPLANT Right 06/08/2022   Procedure: REMOVAL OF TISSUE EXPANDER;  Surgeon: Wallace Going, DO;  Location: [facility];  Service: Plastics;  Laterality: Right;   SIMPLE MASTECTOMY WITH AXILLARY SENTINEL NODE BIOPSY Right 04/21/2022   Procedure: SIMPLE MASTECTOMY WITH AXILLARY SENTINEL NODE BIOPSY;  Surgeon: Robert Bellow, MD;  Location: [facility];  Service: General;  Laterality: Right;  wire localization of lymph node   SKIN BIOPSY Right 11/09/2021   Procedure: PUNCH  BIOPSY SKIN, TATTOO LYMPH NODE RIGHT AXILLA;  Surgeon: Robert Bellow, MD;  Location: [facility];  Service: General;  Laterality: Right;   WISDOM TOOTH EXTRACTION      SOCIAL HISTORY: Social History   Socioeconomic History   Marital status: Married    Spouse name: Jaci Standard   Number of children: 2   Years of education: Not on file   Highest education level: Not on file  Occupational History   Occupation: and teacher's  assistant  Tobacco Use   Smoking status: Never   Smokeless tobacco: Never  Vaping Use   Vaping Use: Never used  Substance and Sexual Activity   Alcohol use: Never   Drug use: Never   Sexual activity: Yes    Birth control/protection: Other-see comments, Surgical    Comment: vasectomy  Other Topics Concern   Not on file  Social History Narrative   Pre-school teacher; in [place]; 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    Social Determinants of Health   Financial Resource Strain: Not on file  Food Insecurity: Not on file  Transportation Needs: Not on file  Physical Activity: Inactive (09/26/2017)   Exercise Vital Sign    Days of Exercise per Week: 0 days    Minutes of Exercise per Session: 0 min  Stress: Not on file  Social Connections: Not on file  Intimate Partner Violence: Not on file    FAMILY HISTORY: Family History  Problem Relation Age of Onset   Ovarian cysts Mother    Migraines Mother    Melanoma Mother        on leg   Hyperlipidemia Father    Diabetes Maternal Aunt    Stomach cancer Paternal Aunt    Diabetes Maternal Grandfather    Lymphoma Cousin 17   Breast cancer Neg Hx     ALLERGIES:  is allergic to metformin and oxycodone.  MEDICATIONS:  Current Outpatient Medications  Medication Sig Dispense Refill   acetaminophen (TYLENOL) 325 MG tablet Take 2 tablets (650 mg total) by mouth every 6 (six) hours as needed for mild pain (or Fever >/= 101).     ascorbic acid (VITAMIN C) 500 MG tablet Take 1 tablet (500 mg total) by mouth daily. 30 tablet     calcium-vitamin D (OSCAL WITH D) 500-5 MG-MCG tablet Take 1 tablet by mouth 2 (two) times daily. 60 tablet 2   Cyanocobalamin (VITAMIN B-12 PO) Take 1 tablet by mouth daily.     ferrous sulfate 325 (65 FE) MG tablet Take 325 mg by mouth 2 (two) times daily with a meal.     lidocaine-prilocaine (EMLA) cream Apply on the port. 30 -45 min  prior to port access. 30 g 3   Magnesium Cl-Calcium Carbonate (SLOW MAGNESIUM/CALCIUM) 70-117 MG TBEC Take 1 tablet by mouth 2 (two) times daily. 60 tablet 6   Multiple Vitamin (MULTIVITAMIN WITH MINERALS) TABS tablet Take 1 tablet by mouth daily.     mupirocin ointment (BACTROBAN) 2 % Apply 1 Application topically 2 (two) times daily. 30 g 0   ONETOUCH VERIO test strip SMARTSIG:1 Each Via Meter Daily PRN     tamoxifen (NOLVADEX) 20 MG tablet Take 1 tablet (20 mg total) by mouth daily. 30 tablet 4   doxycycline (DORYX) 100 MG EC tablet Take 1 tablet (100 mg total) by mouth 2 (two) times daily. (Patient not taking: Reported on 07/02/2022) 14 tablet 0   ondansetron (ZOFRAN) 4 MG tablet Take 1 tablet (4 mg total) by mouth every 8 (eight) hours as needed for up to 20 doses for nausea or vomiting. (Patient not taking: Reported on 07/02/2022) 20 tablet 0   ondansetron (ZOFRAN-ODT) 8 MG disintegrating tablet Take 8 mg by mouth every 8 (eight) hours as needed for nausea or vomiting. (Patient not taking: Reported on 07/02/2022)     potassium chloride (KLOR-CON) 20 MEQ packet Take 20 mEq by mouth 2 (two) times daily. (Patient not taking: Reported on 07/02/2022) 60 packet 3   prochlorperazine (COMPAZINE) 10  MG tablet Take 1 tablet (10 mg total) by mouth every 6 (six) hours as needed for nausea or vomiting. (Patient not taking: Reported on 07/02/2022) 40 tablet 1   sulfamethoxazole-trimethoprim (BACTRIM DS) 800-160 MG tablet Take 1 tablet by mouth 2 (two) times daily. (Patient not taking: Reported on 07/02/2022) 14 tablet 0   No current facility-administered medications for this visit.       Marland Kitchen  PHYSICAL EXAMINATION: ECOG PERFORMANCE STATUS: 0 - Asymptomatic  Vitals:   07/02/22 1000  BP: 117/75  Pulse: 79  Resp: 16  Temp: (!) 96.7 F (35.9 C)     Filed Weights   07/02/22 1000  Weight: 216 lb 14.4 oz (98.4 kg)    Right breast-10:00 vague 3 to 4 cm mass noted-with skin thickening/ ?  Biopsy changes.  No nipple changes no erythema.   Physical Exam Vitals and nursing note reviewed.  HENT:     Head: Normocephalic and atraumatic.     Mouth/Throat:     Pharynx: Oropharynx is clear.  Eyes:     Extraocular Movements: Extraocular movements intact.     Pupils: Pupils are equal, round, and reactive to light.  Cardiovascular:     Rate and Rhythm: Normal rate and regular rhythm.  Pulmonary:     Comments: Decreased breath sounds bilaterally.  Abdominal:     Palpations: Abdomen is soft.  Musculoskeletal:        General: Normal range of motion.     Cervical back: Normal range of motion.  Skin:    General: Skin is warm.  Neurological:     General: No focal deficit present.     Mental Status: She is alert and oriented to person, place, and time.  Psychiatric:        Behavior: Behavior normal.        Judgment: Judgment normal.      LABORATORY DATA:  I have reviewed the data as listed Lab Results  Component Value Date   WBC 5.7 07/02/2022   HGB 10.6 (L) 07/02/2022   HCT 32.8 (L) 07/02/2022   MCV 86.1 07/02/2022   PLT 211 07/02/2022   Recent Labs    12/27/21 2105 12/28/21 0329 04/15/22 0922 06/04/22 1436 07/02/22 1029  NA 134*   < > 139 138 138  K 3.6   < > 3.4* 3.8 3.4*  CL 104   < > 107 103 103  CO2 22   < > '25 26 26  '$ GLUCOSE 192*   < > 149* 190* 202*  BUN 16   < > '13 13 14  '$ CREATININE 0.93   < > 0.62 0.91 0.67  CALCIUM 8.6*   < > 8.7* 9.0 8.7*  GFRNONAA >60   < > >60 >60 >60  PROT 7.0   < > 6.1* 7.3 6.9  ALBUMIN 3.7   < > 3.1* 3.3* 3.3*  AST 42*   < > '20 15 23  '$ ALT 51*   < > '18 14 17  '$ ALKPHOS 70   < > 44 60 55  BILITOT 1.1   < > 0.6  0.4 0.5  BILIDIR 0.2  --   --   --   --   IBILI 0.9  --   --   --   --    < > = values in this interval not displayed.    RADIOGRAPHIC STUDIES: I have personally reviewed the radiological images as listed and agreed with the findings in the report. No results found.  ASSESSMENT & PLAN:   Carcinoma of upper-outer quadrant of right breast in female, estrogen receptor positive ([place]) # MAY 2023- STAGE III-T4N1 ER/PR positive HER2 POSITIVE right breast cancer; LN-positive-ER/PR positive her-2 NEGATIVE; # Currently s/p neoadjuvant chemotherapy- TCH plus P x 6 cycles- s/p Riht mastectomy-[15th, NOv 2023] ypT2 [31m]; ypN0.  Given partial response/involvement of the breast skin/multifocal disease-s/p evaluation with Dr. CDonella Stade  Awaiting to start radiation; planned sim on 1/29.  Patient currently on tamoxifen [DEC-JAN 2024]; Discussed OFS-will also start the patient on Lupron every 3 months.   # Recommend patient start Ado-trastuzumab emtansine alone for 14 cycles.  Discussed the potential side effects including but not limited to liver toxicity neuropathy etc. Patient will start TDM-1 next week.  MUGA scan May 31st, 2023- -61%.  Plan MUGA scan ASAP  # Discussed the role of Zometa every 6 months for 3 years/on adjuvant basis.  Discussed the benefit Zometa is to decrease skeletal related events Discussed the potential side effects including but not limited to-infusion reaction; hypocalcemia; and Osteo-necrosis of jaw. Recommend ca+Vit D BID.  Will check vitamin D next visit.   # Mastectomy site infection/cellulitis currently on-antibiotics as per plastic surgery.  Awaiting evaluation with plastic surgery today.  # PN G-1-2 sec to Taxotere- monitor for now. Stable.   # PV access: port functional  # DISPOSITION: # follow up in 1 week- MD; port-Kadcyla [new]; no labs; MUGA scan ASAP; Lupron injection- Dr.B    All questions were answered. The patient/family knows to call the clinic with any  problems, questions or concerns.       GCammie Sickle MD 07/02/2022 1:03 PM

## 2022-07-02 NOTE — Progress Notes (Signed)
Patient is a 47 year old female here for follow-up after removal of her right breast tissue expander with Dr. Marla Roe on 06/08/2022 due to infection.  She initially underwent right simple mastectomy with axillary sentinel lymph node biopsy by Dr. Bary Castilla in conjunction with immediate right breast reconstruction with patient of tissue expander and Flex HD with Dr. Marla Roe on 04/21/2022.  She subsequently had fluid drained from the right breast on 06/01/2022, culture was sent due to subjective low-grade fevers and redness, cultures grew Staph aureus, pansensitive.  She was subsequently on Bactrim.  We discussed removal of the expander so she could move forward with radiation and chemotherapy treatment for her cancer.  She now presents just over 3 weeks postop from removal of the tissue expander.  She did see oncology today, discussed chemotherapy.  She is scheduled to start radiation Monday, 07/05/2022.  Patient presents today with her husband, she reports she is overall doing well.  She confirms radiation mapping will be Monday, 07/05/2022.  She will have chemotherapy treatments over the next 6 months or so.  She is feeling much better after having the expander removed.  She is not having any infectious symptoms at this time.  She is interested in pursuing reconstruction after chemotherapy and radiation are completed, she has previously discussed latissimus flap with Dr. Marla Roe, but would not like to pursue that route due to her being right-handed and would like to learn more about Diep flaps and a referral.  Chaperone present on exam On exam right breast incision is intact and healing well.  There is no surrounding erythema or cellulitic changes noted.  She does have 2 JP drain insertion sites that are healing well.  There is no subcutaneous fluid collections noted.  A/P:  47 year old female status post right breast reconstruction, however expander was removed due to infection.  She is  scheduled to start radiation and chemotherapy soon, from a surgical standpoint she is stable for beginning radiation as the incision is well-healed.  There is no signs of infection or concern on exam.  We discussed scheduling a follow-up with our office after completing radiation to reevaluate and discuss referral to [facility] for Diep flap.  Patient was in agreement with this plan, we discussed that of course if she had any issues prior to her next scheduled appointment in June that we could follow-up virtually or have an in person evaluation.  Patient was understanding and in agreement with this.  Pictures were obtained of the patient and placed in the chart with the patient's or guardian's permission.

## 2022-07-04 ENCOUNTER — Other Ambulatory Visit: Payer: Self-pay

## 2022-07-05 ENCOUNTER — Ambulatory Visit
Admission: RE | Admit: 2022-07-05 | Discharge: 2022-07-05 | Disposition: A | Discharge status: Home / Self Care | Payer: 59 | Source: Ambulatory Visit | Attending: Radiation Oncology | Admitting: Radiation Oncology

## 2022-07-05 ENCOUNTER — Ambulatory Visit
Admission: RE | Admit: 2022-07-05 | Discharge: 2022-07-05 | Disposition: A | Discharge status: Home / Self Care | Payer: 59 | Source: Ambulatory Visit | Attending: Internal Medicine | Admitting: Internal Medicine

## 2022-07-05 ENCOUNTER — Ambulatory Visit: Payer: 59

## 2022-07-05 DIAGNOSIS — Z170 Estrogen receptor positive status [ER+]: Secondary | ICD-10-CM | POA: Insufficient documentation

## 2022-07-05 DIAGNOSIS — Z79899 Other long term (current) drug therapy: Secondary | ICD-10-CM | POA: Insufficient documentation

## 2022-07-05 DIAGNOSIS — C50411 Malignant neoplasm of upper-outer quadrant of right female breast: Secondary | ICD-10-CM | POA: Diagnosis present

## 2022-07-05 DIAGNOSIS — Z9221 Personal history of antineoplastic chemotherapy: Secondary | ICD-10-CM | POA: Diagnosis present

## 2022-07-05 DIAGNOSIS — E119 Type 2 diabetes mellitus without complications: Secondary | ICD-10-CM | POA: Insufficient documentation

## 2022-07-05 MED ORDER — TECHNETIUM TC 99M-LABELED RED BLOOD CELLS IV KIT
22.0100 | PACK | Freq: Once | INTRAVENOUS | Status: AC | PRN
  Administered 2022-07-05: 22.01 via INTRAVENOUS

## 2022-07-06 ENCOUNTER — Ambulatory Visit: Payer: 59

## 2022-07-07 ENCOUNTER — Ambulatory Visit: Payer: 59

## 2022-07-07 DIAGNOSIS — C50411 Malignant neoplasm of upper-outer quadrant of right female breast: Secondary | ICD-10-CM | POA: Diagnosis not present

## 2022-07-08 ENCOUNTER — Ambulatory Visit: Payer: 59

## 2022-07-09 ENCOUNTER — Other Ambulatory Visit: Payer: Self-pay | Admitting: Internal Medicine

## 2022-07-09 ENCOUNTER — Inpatient Hospital Stay (HOSPITAL_BASED_OUTPATIENT_CLINIC_OR_DEPARTMENT_OTHER): Payer: 59 | Admitting: Internal Medicine

## 2022-07-09 ENCOUNTER — Encounter: Payer: Self-pay | Admitting: *Deleted

## 2022-07-09 ENCOUNTER — Ambulatory Visit: Payer: 59

## 2022-07-09 ENCOUNTER — Other Ambulatory Visit: Payer: Self-pay | Admitting: *Deleted

## 2022-07-09 ENCOUNTER — Inpatient Hospital Stay: Payer: 59

## 2022-07-09 ENCOUNTER — Encounter: Payer: Self-pay | Admitting: Internal Medicine

## 2022-07-09 VITALS — BP 113/67 | HR 88 | Temp 98.5°F | Resp 18 | Wt 216.0 lb

## 2022-07-09 DIAGNOSIS — R21 Rash and other nonspecific skin eruption: Secondary | ICD-10-CM | POA: Insufficient documentation

## 2022-07-09 DIAGNOSIS — Z510 Encounter for antineoplastic radiation therapy: Secondary | ICD-10-CM | POA: Insufficient documentation

## 2022-07-09 DIAGNOSIS — Z79899 Other long term (current) drug therapy: Secondary | ICD-10-CM | POA: Insufficient documentation

## 2022-07-09 DIAGNOSIS — D500 Iron deficiency anemia secondary to blood loss (chronic): Secondary | ICD-10-CM

## 2022-07-09 DIAGNOSIS — E876 Hypokalemia: Secondary | ICD-10-CM | POA: Insufficient documentation

## 2022-07-09 DIAGNOSIS — Z79810 Long term (current) use of selective estrogen receptor modulators (SERMs): Secondary | ICD-10-CM | POA: Insufficient documentation

## 2022-07-09 DIAGNOSIS — G620 Drug-induced polyneuropathy: Secondary | ICD-10-CM | POA: Insufficient documentation

## 2022-07-09 DIAGNOSIS — E119 Type 2 diabetes mellitus without complications: Secondary | ICD-10-CM | POA: Diagnosis not present

## 2022-07-09 DIAGNOSIS — Z5112 Encounter for antineoplastic immunotherapy: Secondary | ICD-10-CM | POA: Insufficient documentation

## 2022-07-09 DIAGNOSIS — Z170 Estrogen receptor positive status [ER+]: Secondary | ICD-10-CM | POA: Insufficient documentation

## 2022-07-09 DIAGNOSIS — C50411 Malignant neoplasm of upper-outer quadrant of right female breast: Secondary | ICD-10-CM | POA: Insufficient documentation

## 2022-07-09 DIAGNOSIS — Z5111 Encounter for antineoplastic chemotherapy: Secondary | ICD-10-CM | POA: Insufficient documentation

## 2022-07-09 MED ORDER — SODIUM CHLORIDE 0.9 % IV SOLN
Freq: Once | INTRAVENOUS | Status: AC
  Filled 2022-07-09: qty 250

## 2022-07-09 MED ORDER — LEUPROLIDE ACETATE (3 MONTH) 11.25 MG IM KIT
11.2500 mg | PACK | Freq: Once | INTRAMUSCULAR | Status: AC
  Administered 2022-07-09: 11.25 mg via INTRAMUSCULAR
  Filled 2022-07-09: qty 11.25

## 2022-07-09 MED ORDER — DIPHENHYDRAMINE HCL 25 MG PO CAPS
50.0000 mg | ORAL_CAPSULE | Freq: Once | ORAL | Status: AC
  Administered 2022-07-09: 50 mg via ORAL
  Filled 2022-07-09: qty 2

## 2022-07-09 MED ORDER — ACETAMINOPHEN 325 MG PO TABS
650.0000 mg | ORAL_TABLET | Freq: Once | ORAL | Status: AC
  Administered 2022-07-09: 650 mg via ORAL
  Filled 2022-07-09: qty 2

## 2022-07-09 MED ORDER — HEPARIN SOD (PORK) LOCK FLUSH 100 UNIT/ML IV SOLN
500.0000 [IU] | Freq: Once | INTRAVENOUS | Status: AC | PRN
  Administered 2022-07-09: 500 [IU]
  Filled 2022-07-09: qty 5

## 2022-07-09 MED ORDER — PROCHLORPERAZINE MALEATE 10 MG PO TABS
10.0000 mg | ORAL_TABLET | Freq: Once | ORAL | Status: AC
  Administered 2022-07-09: 10 mg via ORAL
  Filled 2022-07-09: qty 1

## 2022-07-09 MED ORDER — SODIUM CHLORIDE 0.9 % IV SOLN
3.6000 mg/kg | Freq: Once | INTRAVENOUS | Status: AC
  Administered 2022-07-09: 360 mg via INTRAVENOUS
  Filled 2022-07-09: qty 8

## 2022-07-09 NOTE — Progress Notes (Signed)
Doing well. Neuropathy is much better. Starting new chemo drug today. Started back to work part time. 9-12; 3 days per week. Potassium is low today. Told patient to start back on her oral potassium.

## 2022-07-09 NOTE — Patient Instructions (Addendum)
[place]  Discharge Instructions: Thank you for choosing [facility] to provide your oncology and hematology care.  If you have a lab appointment with the [facility], please go directly to the [facility] and check in at the registration area.  Wear comfortable clothing and clothing appropriate for easy access to any Portacath or PICC line.   We strive to give you quality time with your provider. You may need to reschedule your appointment if you arrive late (15 or more minutes).  Arriving late affects you and other patients whose appointments are after yours.  Also, if you miss three or more appointments without notifying the office, you may be dismissed from the clinic at the provider's discretion.      For prescription refill requests, have your pharmacy contact our office and allow 72 hours for refills to be completed.    Today you received the following chemotherapy and/or immunotherapy agents Ado-Trastuzumab and Lupron inj..      To help prevent nausea and vomiting after your treatment, we encourage you to take your nausea medication as directed.  BELOW ARE SYMPTOMS THAT SHOULD BE REPORTED IMMEDIATELY: *FEVER GREATER THAN 100.4 F (38 C) OR HIGHER *CHILLS OR SWEATING *NAUSEA AND VOMITING THAT IS NOT CONTROLLED WITH YOUR NAUSEA MEDICATION *UNUSUAL SHORTNESS OF BREATH *UNUSUAL BRUISING OR BLEEDING *URINARY PROBLEMS (pain or burning when urinating, or frequent urination) *BOWEL PROBLEMS (unusual diarrhea, constipation, pain near the anus) TENDERNESS IN MOUTH AND THROAT WITH OR WITHOUT PRESENCE OF ULCERS (sore throat, sores in mouth, or a toothache) UNUSUAL RASH, SWELLING OR PAIN  UNUSUAL VAGINAL DISCHARGE OR ITCHING   Items with * indicate a potential emergency and should be followed up as soon as possible or go to the Emergency Department if any problems should occur.  Please show the CHEMOTHERAPY ALERT CARD or IMMUNOTHERAPY  ALERT CARD at check-in to the Emergency Department and triage nurse.  Should you have questions after your visit or need to cancel or reschedule your appointment, please contact [facility]  [phone] and follow the prompts.  Office hours are 8:00 a.m. to 4:30 p.m. Monday - Friday. Please note that voicemails left after 4:00 p.m. may not be returned until the following business day.  We are closed weekends and major holidays. You have access to a nurse at all times for urgent questions. Please call the main number to the clinic [phone] and follow the prompts.  For any non-urgent questions, you may also contact your provider using MyChart. We now offer e-Visits for anyone 71 and older to request care online for non-urgent symptoms. For details visit [email or web address].   Also download the MyChart app! Go to the app store, search "MyChart", open the app, select [facility], and log in with your MyChart username and password.

## 2022-07-09 NOTE — Progress Notes (Signed)
Consent signed today for Ado-trastuzumab emtansine and Leuprolide injection.

## 2022-07-09 NOTE — Patient Instructions (Addendum)
#   Recommend calcium 1200 plus vitamin D-3 1000-over-the-counter-1 pill a day.  

## 2022-07-09 NOTE — Assessment & Plan Note (Addendum)
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  Given partial response/involvement of the breast skin/multifocal disease-s/p evaluation with Dr. CDonella Stade  Awaiting to start radiation; planned sim on 1/29.  Patient currently on tamoxifen [DEC-JAN 2024]; + OFS- Lupron every 3 months ].   # proceed with Ado-trastuzumab emtansine alone for 14 cycles.  Discussed the potential side effects including but not limited to liver toxicity neuropathy etc. Patient will start TDM-1 plan today.    MUGA scan May 31st, 2023- -61%. JAN 2024- Normal LEFT ventricular ejection fraction of 57.1% slightly Decreased. RT iuntil 3/22.   # Discussed the role of Zometa every 6 months for 3 years/on adjuvant basis.  Discussed the benefit Zometa is to decrease skeletal related events Discussed the potential side effects including but not limited to-infusion reaction; hypocalcemia; and Osteo-necrosis of jaw. Recommend ca+Vit D BID.  Will check vitamin D next visit.   # Mastectomy site infection/cellulitis currently on-antibiotics as per plastic surgery. S/p evaluation with plastic surgery today.Stable.   # Hypocalcemia- 8.7- recommend ca+vit- Discussed regarding low calcium. Recommend calcium 1200 plus vitamin D-3 1000-over-the-counter-1 pill a day.  Check vit D 25-OH levels.  # PN G-1-2 sec to Taxotere- monitor for now. Stable.   # PV access: port functional  Lupron 07/09/2022 #1 q 49M; Zometa-? # DISPOSITION: # Kadcyla; Lupron injection- # follow up in 3 week- MD;  port- labs; cbc/cmp;vit D 25-OH;  Kadcyla-  Dr.B

## 2022-07-09 NOTE — Progress Notes (Signed)
[name] NOTE  Patient Care Team: Latanya Maudlin, NP as PCP - General (Family Medicine) Daiva Huge, RN as Oncology Nurse Navigator Cammie Sickle, [name]  CHIEF COMPLAINTS/PURPOSE OF CONSULTATION: Breast cancer  Oncology History Overview Note  MAY 2023-    Targeted ultrasound is performed, showing a 2.9 x 1.9 x 2.0 cm irregular hypoechoic mass right breast 10 o'clock position 6 cm from nipple at the site of palpable concern.   There is an abnormal 8 mm thickened right axillary lymph node.   There is skin thickening overlying the right breast. ------------------  A. BREAST, RIGHT AT 10:00, 6 CM FROM THE NIPPLE; ULTRASOUND-GUIDED CORE  NEEDLE BIOPSY:  - INVASIVE MAMMARY CARCINOMA, NO SPECIAL TYPE.   Size of invasive carcinoma: 11 mm in this sample  Histologic grade of invasive carcinoma: Grade 2                       Glandular/tubular differentiation score: 3                       Nuclear pleomorphism score: 2                       Mitotic rate score: 1                       Total score: 6  Ductal carcinoma in situ: Present, intermediate grade  Lymphovascular invasion: Not identified   B. LYMPH NODE, RIGHT AXILLARY; ULTRASOUND-GUIDED CORE NEEDLE BIOPSY:  - 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 IN GREATEST  EXTENT.   Comment:  The malignancy in the primary breast lesion appears morphologically  different from tumor in the lymph node sampling, with tumor present in  lymph node more suggestive of a histologic grade 3, with significantly  increased mitotic activity and pleomorphism. Due to the discrepancy in  these morphologic patterns, ER/PR/HER2 immunohistochemistry will be  performed on both blocks A1 and B1, with reflex to [place] for HER2 2+. The  results will be reported in an addendum.   ADDENDUM:  CASE SUMMARY: BREAST BIOMARKER TESTS - A - BREAST, RIGHT AT 10:00  Estrogen Receptor  (ER) Status: POSITIVE          Percentage of cells with nuclear positivity: 71-80%          Average intensity of staining: Strong   Progesterone Receptor (PgR) Status: POSITIVE          Percentage of cells with nuclear positivity: 81-90%          Average intensity of staining: Strong   HER2 (by immunohistochemistry): POSITIVE (Score 3+)       Percentage of cells with uniform intense complete membrane  staining: 61-70%  HER2 displays a heterogeneous staining pattern, with areas showing a  strong 3+ staining pattern, and other regions with complete absence (0+)  of staining.   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  Discussed with Dr. Sedalia Muta tumor heterogeneity. BREAST MRI- JUNE 2023- The patient's known malignancy in the upper outer quadrant of the right breast measures 3.8 x 2.8 x 3.6 cm. Numerous other suspicious masses are identified in the right breast as above involving the upper outer and upper inner quadrants, located both anterior and posterior to the known malignancy.  Numerous enhancing foci in the skin as above are worrisome for skin metastases ; Bx proven. Biopsy proven metastatic node in the right axilla.  No evidence of malignancy in the left breast;  Two mildly prominent right internal mammary nodes were not FDG avid on recent PET-CT imaging.  Currently on neoadjuvant chemotherapy- TCH  plus P x6 cycles;  MUGA scan May 31st, 2023- -61%.    # June 7th, 2023- NEO-ADJUVANT CHEMO- TCH+P x6 cycles- s/p Mastectomy [NOV 15th, 2023] [Dr.Byrnett]-  ypT2 [11m]; ypN0.  # JAN mid, 2024 -TAMOXFEN 20 mg a day; ADD LupronFEB 2nd.2 2024  # FEB 2nd, 2023- Kadcyla q 3 W x14 cycles;   # Lupron q3 M [/start 2/07-2022]   # Genetic counseling s/p- NEG for any deleterious mutations.     Carcinoma of upper-outer quadrant of right breast in female, estrogen receptor positive ([place]  10/26/2021 Initial Diagnosis   Carcinoma of upper-outer quadrant of right breast in female, estrogen receptor positive ([place]   10/26/2021 Cancer Staging   Staging form: Breast, AJCC 8th Edition - Clinical: Stage IIIA (cT4d, cN1, cM0, G2, ER+, PR+, HER2+) - Signed by BCammie Sickle MD on 06/04/2022 Histologic grading system: 3 grade system   11/10/2021 - 03/01/2022 Chemotherapy   Patient is on Treatment Plan : BREAST  Docetaxel + Carboplatin + Trastuzumab + Pertuzumab  (TCHP) q21d      11/11/2021 - 01/13/2022 Chemotherapy   Patient is on Treatment Plan : BREAST  Docetaxel + Carboplatin + Trastuzumab + Pertuzumab  (TCHP) q21d       Genetic Testing   Negative genetic testing. No pathogenic variants identified on the [facility] panel. The report date is 11/29/2021.  The CancerNext-Expanded + RNAinsight gene panel offered by [employer] includes sequencing and rearrangement analysis for the following 77 genes: IP, ALK, APC*, ATM*, AXIN2, BAP1, BARD1, BLM, BMPR1A, BRCA1*, BRCA2*, BRIP1*, CDC73, CDH1*,CDK4, CDKN1B, CDKN2A, CHEK2*, CTNNA1, DICER1, FANCC, FH, FLCN, GALNT12, KIF1B, LZTR1, MAX, MEN1, MET, MLH1*, MSH2*, MSH3, MSH6*, MUTYH*, NBN, NF1*, NF2, NTHL1, PALB2*, PHOX2B, PMS2*, POT1, PRKAR1A, PTCH1, PTEN*, RAD51C*, RAD51D*,RB1, RECQL, RET, SDHA, SDHAF2, SDHB, SDHC, SDHD, SMAD4, SMARCA4, SMARCB1, SMARCE1, STK11, SUFU, TMEM127, TP53*,TSC1, TSC2, VHL and XRCC2 (sequencing and deletion/duplication);  EGFR, EGLN1, HOXB13, KIT, MITF, PDGFRA, POLD1 and POLE (sequencing only); EPCAM and GREM1 (deletion/duplication only).   07/09/2022 -  Chemotherapy   Patient is on Treatment Plan : BREAST ADO-Trastuzumab Emtansine (Kadcyla) q21d      HISTORY OF PRESENTING ILLNESS: Patient is ambulating independently.  Accompanied by her husband.   CClaudean Severance89y.o.  female right breast TRIPLE POSITIVE with T4- Stage III currently s/p neoadjuvant TCH plus P chemotherapy #6 -s/p mastectomy-with expanders-currently on tamoxifen is here for follow-up.  Patient also had a MUGA scan.  Doing well. Neuropathy is much better. Starting new chemo drug today. Started back to work part time. 9-12; 3 days per week.  Patient currently start postmastectomy radiation on 2/07.  Chronic mild fatigue. No skin rash.    Review of Systems  Constitutional:  Positive for malaise/fatigue. Negative for chills, diaphoresis, fever and weight loss.  HENT:  Negative for nosebleeds and sore throat.   Eyes:  Negative for double vision.  Respiratory:  Negative for cough, hemoptysis, sputum production, shortness of breath and wheezing.   Cardiovascular:  Negative for chest pain, palpitations, orthopnea and leg swelling.  Gastrointestinal:  Negative for abdominal pain, blood in stool, constipation, diarrhea, heartburn, melena, nausea and vomiting.  Genitourinary:  Negative for dysuria, frequency and urgency.  Musculoskeletal:  Positive for myalgias. Negative for back pain and joint pain.  Skin: Negative.  Negative for itching and rash.  Neurological:  Negative for dizziness, tingling, focal weakness, weakness and headaches.  Endo/Heme/Allergies:  Does not bruise/bleed easily.  Psychiatric/Behavioral:  Negative for depression. The patient is not nervous/anxious and does not have insomnia.      MEDICAL HISTORY:  Past Medical History:  Diagnosis Date   Anemia    Carcinoma of upper-outer quadrant of right breast in female, estrogen  receptor positive ([place]) 10/26/2021   Diabetes mellitus without complication (HCC)    Family history of adverse reaction to anesthesia    grandmother had issues but doesnt know what the issue was, she was older.   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 Axilla Butterfly hydro clip-MACRO METASTATIC MAMMARY CARCINOMA   BREAST BIOPSY Right 04/21/2022   [place] RT PLC BREAST LOC DEV   1ST LESION  INC US GUIDE 04/21/2022 [facility]   BREAST RECONSTRUCTION WITH PLACEMENT OF TISSUE EXPANDER AND FLEX HD (ACELLULAR HYDRATED DERMIS) Right 04/21/2022   Procedure: RIGHT BREAST RECONSTRUCTION WITH PLACEMENT OF TISSUE EXPANDER AND FLEX HD (ACELLULAR HYDRATED DERMIS);  Surgeon: Wallace Going, DO;  Location: [facility];  Service: Plastics;  Laterality: Right;   CESAREAN SECTION  2005/2008   DILATION AND CURETTAGE OF UTERUS  2003   PORTACATH PLACEMENT Left 11/09/2021   Procedure: INSERTION PORT-A-CATH;  Surgeon: Robert Bellow, MD;  Location: [facility];  Service: General;  Laterality: Left;   REMOVAL OF TISSUE EXPANDER AND PLACEMENT OF IMPLANT Right 06/08/2022   Procedure: REMOVAL OF TISSUE EXPANDER;  Surgeon: Wallace Going, DO;  Location: [facility];  Service: Plastics;  Laterality: Right;   SIMPLE MASTECTOMY WITH AXILLARY SENTINEL NODE BIOPSY Right 04/21/2022   Procedure: SIMPLE MASTECTOMY WITH AXILLARY SENTINEL NODE BIOPSY;  Surgeon: Robert Bellow, MD;  Location: [facility];  Service: General;  Laterality: Right;  wire localization of lymph node   SKIN BIOPSY Right 11/09/2021   Procedure: PUNCH BIOPSY SKIN, TATTOO LYMPH NODE RIGHT AXILLA;  Surgeon: Robert Bellow, MD;  Location: [facility];  Service: General;  Laterality:  Right;   WISDOM TOOTH EXTRACTION      SOCIAL HISTORY: Social History   Socioeconomic History   Marital status: Married    Spouse name: Jaci Standard   Number of children: 2   Years of education: Not on file   Highest education level: Not on file  Occupational History   Occupation: and Optometrist  Tobacco Use   Smoking status: Never   Smokeless tobacco: Never  Vaping Use   Vaping  Use: Never used  Substance and Sexual Activity   Alcohol use: Never   Drug use: Never   Sexual activity: Yes    Birth control/protection: Other-see comments, Surgical    Comment: vasectomy  Other Topics Concern   Not on file  Social History Narrative   Pre-school teacher; in [place]; 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    Social Determinants of Health   Financial Resource Strain: Not on file  Food Insecurity: Not on file  Transportation Needs: Not on file  Physical Activity: Inactive (09/26/2017)   Exercise Vital Sign    Days of Exercise per Week: 0 days    Minutes of Exercise per Session: 0 min  Stress: Not on file  Social Connections: Not on file  Intimate Partner Violence: Not on file    FAMILY HISTORY: Family History  Problem Relation Age of Onset   Ovarian cysts Mother    Migraines Mother    Melanoma Mother        on leg   Hyperlipidemia Father    Diabetes Maternal Aunt    Stomach cancer Paternal Aunt    Diabetes Maternal Grandfather    Lymphoma Cousin 17   Breast cancer Neg Hx     ALLERGIES:  is allergic to metformin and oxycodone.  MEDICATIONS:  Current Outpatient Medications  Medication Sig Dispense Refill   acetaminophen (TYLENOL) 325 MG tablet Take 2 tablets (650 mg total) by mouth every 6 (six) hours as needed for mild pain (or Fever >/= 101).     ascorbic acid (VITAMIN C) 500 MG tablet Take 1 tablet (500 mg total) by mouth daily. 30 tablet    calcium-vitamin D (OSCAL WITH D) 500-5 MG-MCG tablet Take 1 tablet by mouth 2 (two) times daily. 60 tablet 2   Cyanocobalamin (VITAMIN  B-12 PO) Take 1 tablet by mouth daily.     ferrous sulfate 325 (65 FE) MG tablet Take 325 mg by mouth 2 (two) times daily with a meal.     lidocaine-prilocaine (EMLA) cream Apply on the port. 30 -45 min  prior to port access. 30 g 3   Magnesium Cl-Calcium Carbonate (SLOW MAGNESIUM/CALCIUM) 70-117 MG TBEC Take 1 tablet by mouth 2 (two) times daily. 60 tablet 6   Multiple Vitamin (MULTIVITAMIN WITH MINERALS) TABS tablet Take 1 tablet by mouth daily.     mupirocin ointment (BACTROBAN) 2 % Apply 1 Application topically 2 (two) times daily. 30 g 0   ONETOUCH VERIO test strip SMARTSIG:1 Each Via Meter Daily PRN     tamoxifen (NOLVADEX) 20 MG tablet Take 1 tablet (20 mg total) by mouth daily. 30 tablet 4   ondansetron (ZOFRAN) 4 MG tablet Take 1 tablet (4 mg total) by mouth every 8 (eight) hours as needed for up to 20 doses for nausea or vomiting. (Patient not taking: Reported on 07/02/2022) 20 tablet 0   ondansetron (ZOFRAN-ODT) 8 MG disintegrating tablet Take 8 mg by mouth every 8 (eight) hours as needed for nausea or vomiting. (Patient not taking: Reported on 07/02/2022)     potassium chloride (KLOR-CON) 20 MEQ packet Take 20 mEq by mouth 2 (two) times daily. (Patient not taking: Reported on 07/02/2022) 60 packet 3   prochlorperazine (COMPAZINE) 10 MG tablet Take 1 tablet (10 mg total) by mouth every 6 (six) hours as needed for nausea or vomiting. (Patient not taking: Reported on 07/02/2022) 40 tablet 1   No current facility-administered medications for this visit.   Facility-Administered Medications Ordered in Other Visits  Medication Dose  Route Frequency Provider Last Rate Last Admin   ado-trastuzumab emtansine (KADCYLA) 360 mg in sodium chloride 0.9 % 250 mL chemo infusion  3.6 mg/kg (Treatment Plan Recorded) Intravenous Once Charlaine Dalton R, MD       heparin lock flush 100 unit/mL  500 Units Intracatheter Once PRN Cammie Sickle, MD          .  PHYSICAL EXAMINATION: ECOG PERFORMANCE  STATUS: 0 - Asymptomatic  Vitals:   07/09/22 0846  BP: 113/67  Pulse: 88  Resp: 18  Temp: 98.5 F (36.9 C)  SpO2: 99%     Filed Weights   07/09/22 0846  Weight: 216 lb (98 kg)    Right breast-10:00 vague 3 to 4 cm mass noted-with skin thickening/ ?  Biopsy changes.  No nipple changes no erythema.   Physical Exam Vitals and nursing note reviewed.  HENT:     Head: Normocephalic and atraumatic.     Mouth/Throat:     Pharynx: Oropharynx is clear.  Eyes:     Extraocular Movements: Extraocular movements intact.     Pupils: Pupils are equal, round, and reactive to light.  Cardiovascular:     Rate and Rhythm: Normal rate and regular rhythm.  Pulmonary:     Comments: Decreased breath sounds bilaterally.  Abdominal:     Palpations: Abdomen is soft.  Musculoskeletal:        General: Normal range of motion.     Cervical back: Normal range of motion.  Skin:    General: Skin is warm.  Neurological:     General: No focal deficit present.     Mental Status: She is alert and oriented to person, place, and time.  Psychiatric:        Behavior: Behavior normal.        Judgment: Judgment normal.      LABORATORY DATA:  I have reviewed the data as listed Lab Results  Component Value Date   WBC 5.7 07/02/2022   HGB 10.6 (L) 07/02/2022   HCT 32.8 (L) 07/02/2022   MCV 86.1 07/02/2022   PLT 211 07/02/2022   Recent Labs    12/27/21 2105 12/28/21 0329 04/15/22 0922 06/04/22 1436 07/02/22 1029  NA 134*   < > 139 138 138  K 3.6   < > 3.4* 3.8 3.4*  CL 104   < > 107 103 103  CO2 22   < > '25 26 26  '$ GLUCOSE 192*   < > 149* 190* 202*  BUN 16   < > '13 13 14  '$ CREATININE 0.93   < > 0.62 0.91 0.67  CALCIUM 8.6*   < > 8.7* 9.0 8.7*  GFRNONAA >60   < > >60 >60 >60  PROT 7.0   < > 6.1* 7.3 6.9  ALBUMIN 3.7   < > 3.1* 3.3* 3.3*  AST 42*   < > '20 15 23  '$ ALT 51*   < > '18 14 17  '$ ALKPHOS 70   < > 44 60 55  BILITOT 1.1   < > 0.6 0.4 0.5  BILIDIR 0.2  --   --   --   --   IBILI 0.9   --   --   --   --    < > = values in this interval not displayed.    RADIOGRAPHIC STUDIES: I have personally reviewed the radiological images as listed and agreed with the findings in the report. NM Cardiac Muga Rest  Result Date: 07/05/2022  CLINICAL DATA:  Breast cancer, cardiotoxic chemotherapy EXAM: NUCLEAR MEDICINE CARDIAC BLOOD POOL IMAGING (MUGA) TECHNIQUE: Cardiac multi-gated acquisition was performed at rest following intravenous injection of Tc-51mlabeled red blood cells. RADIOPHARMACEUTICALS:  22.01 mCi Tc-955mertechnetate in-vitro labeled red blood cells IV COMPARISON:  11/04/2021 FINDINGS: Calculated LEFT ventricular ejection fraction is 57.1%, normal, slightly decreased from the 62.1% on the prior exam. Study was obtained at a cardiac rate of 71 bpm. Patient was fairly rhythmic during imaging. Cine analysis of the LEFT ventricle in 3 projections demonstrates normal LEFT ventricular wall motion. IMPRESSION: Normal LEFT ventricular ejection fraction of 57.1% slightly decreased from the 62.1% on the prior study of 11/04/2021. No focal LEFT ventricular wall motion abnormalities. Electronically Signed   By: MaLavonia Dana.D.   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  Given partial response/involvement of the breast skin/multifocal disease-s/p evaluation with Dr. CryDonella StadeAwaiting to start radiation; planned sim on 1/29.  Patient currently on tamoxifen [DEC-JAN 2024]; + OFS- Lupron every 3 months ].   # proceed with Ado-trastuzumab emtansine alone for 14 cycles.  Discussed the potential side effects including but not limited to liver toxicity neuropathy etc.  Patient will start TDM-1 plan today.    MUGA scan May 31st, 2023- -61%. JAN 2024- Normal LEFT ventricular ejection fraction of 57.1% slightly Decreased. RT iuntil 3/22.   # Discussed the role of Zometa every 6 months for 3 years/on adjuvant basis.  Discussed the benefit Zometa is to decrease skeletal related events Discussed the potential side effects including but not limited to-infusion reaction; hypocalcemia; and Osteo-necrosis of jaw. Recommend ca+Vit D BID.  Will check vitamin D next visit.   # Mastectomy site infection/cellulitis currently on-antibiotics as per plastic surgery. S/p evaluation with plastic surgery today.Stable.   # Hypocalcemia- 8.7- recommend ca+vit- Discussed regarding low calcium. Recommend calcium 1200 plus vitamin D-3 1000-over-the-counter-1 pill a day.  Check vit D 25-OH levels.  # PN G-1-2 sec to Taxotere- monitor for now. Stable.   # PV access: port functional  Lupron 07/09/2022 #1 q 9M; Zometa-? # DISPOSITION: # Kadcyla; Lupron injection- # follow up in 3 week- MD;  port- labs; cbc/cmp;vit D 25-OH;  Kadcyla-  Dr.B    All questions were answered. The patient/family knows to call the clinic with any problems, questions or concerns.       GovCammie SickleD 07/09/2022 10:10 AM

## 2022-07-12 ENCOUNTER — Telehealth: Payer: Self-pay | Admitting: *Deleted

## 2022-07-12 ENCOUNTER — Ambulatory Visit: Payer: 59

## 2022-07-12 NOTE — Telephone Encounter (Signed)
Follow up phone call regarding answering service call of fever over the weekend. Pt states she began to feel cold chills checked her fever it was 102.8. Fever actually went away a couple of hours later by 10 pm she felt fine. N further symptoms. Feeling well.

## 2022-07-13 ENCOUNTER — Ambulatory Visit
Admission: RE | Admit: 2022-07-13 | Discharge: 2022-07-13 | Disposition: A | Discharge status: Home / Self Care | Payer: 59 | Source: Ambulatory Visit | Attending: Radiation Oncology | Admitting: Radiation Oncology

## 2022-07-13 ENCOUNTER — Ambulatory Visit: Payer: 59

## 2022-07-13 DIAGNOSIS — C50411 Malignant neoplasm of upper-outer quadrant of right female breast: Secondary | ICD-10-CM | POA: Diagnosis not present

## 2022-07-13 DIAGNOSIS — Z5111 Encounter for antineoplastic chemotherapy: Secondary | ICD-10-CM | POA: Diagnosis not present

## 2022-07-13 DIAGNOSIS — Z510 Encounter for antineoplastic radiation therapy: Secondary | ICD-10-CM | POA: Diagnosis not present

## 2022-07-14 ENCOUNTER — Other Ambulatory Visit: Payer: Self-pay

## 2022-07-14 ENCOUNTER — Ambulatory Visit
Admission: RE | Admit: 2022-07-14 | Discharge: 2022-07-14 | Disposition: A | Discharge status: Home / Self Care | Payer: 59 | Source: Ambulatory Visit | Attending: Radiation Oncology | Admitting: Radiation Oncology

## 2022-07-14 ENCOUNTER — Ambulatory Visit: Payer: 59

## 2022-07-14 DIAGNOSIS — Z5111 Encounter for antineoplastic chemotherapy: Secondary | ICD-10-CM | POA: Diagnosis not present

## 2022-07-14 DIAGNOSIS — Z510 Encounter for antineoplastic radiation therapy: Secondary | ICD-10-CM | POA: Diagnosis not present

## 2022-07-14 DIAGNOSIS — C50411 Malignant neoplasm of upper-outer quadrant of right female breast: Secondary | ICD-10-CM | POA: Diagnosis not present

## 2022-07-14 LAB — RAD ONC ARIA SESSION SUMMARY
Course Elapsed Days: 0
Plan Fractions Treated to Date: 1
Plan Prescribed Dose Per Fraction: 1.8 Gy
Plan Total Fractions Prescribed: 28
Plan Total Prescribed Dose: 50.4 Gy
Reference Point Dosage Given to Date: 1.8 Gy
Reference Point Session Dosage Given: 1.8 Gy
Session Number: 1

## 2022-07-15 ENCOUNTER — Encounter: Payer: Self-pay | Admitting: Hospice and Palliative Medicine

## 2022-07-15 ENCOUNTER — Ambulatory Visit: Payer: 59

## 2022-07-15 ENCOUNTER — Ambulatory Visit
Admission: RE | Admit: 2022-07-15 | Discharge: 2022-07-15 | Disposition: A | Discharge status: Home / Self Care | Payer: 59 | Source: Ambulatory Visit | Attending: Radiation Oncology | Admitting: Radiation Oncology

## 2022-07-15 ENCOUNTER — Other Ambulatory Visit: Payer: 59

## 2022-07-15 ENCOUNTER — Encounter: Payer: Self-pay | Admitting: Internal Medicine

## 2022-07-15 ENCOUNTER — Inpatient Hospital Stay (HOSPITAL_BASED_OUTPATIENT_CLINIC_OR_DEPARTMENT_OTHER): Payer: 59 | Admitting: Hospice and Palliative Medicine

## 2022-07-15 ENCOUNTER — Other Ambulatory Visit: Payer: Self-pay

## 2022-07-15 VITALS — BP 126/68 | HR 92 | Temp 98.9°F | Resp 19 | Wt 214.7 lb

## 2022-07-15 DIAGNOSIS — Z510 Encounter for antineoplastic radiation therapy: Secondary | ICD-10-CM | POA: Diagnosis not present

## 2022-07-15 DIAGNOSIS — Z170 Estrogen receptor positive status [ER+]: Secondary | ICD-10-CM | POA: Diagnosis not present

## 2022-07-15 DIAGNOSIS — C50411 Malignant neoplasm of upper-outer quadrant of right female breast: Secondary | ICD-10-CM | POA: Diagnosis not present

## 2022-07-15 DIAGNOSIS — Z5111 Encounter for antineoplastic chemotherapy: Secondary | ICD-10-CM | POA: Diagnosis not present

## 2022-07-15 LAB — RAD ONC ARIA SESSION SUMMARY
Course Elapsed Days: 1
Plan Fractions Treated to Date: 2
Plan Prescribed Dose Per Fraction: 1.8 Gy
Plan Total Fractions Prescribed: 28
Plan Total Prescribed Dose: 50.4 Gy
Reference Point Dosage Given to Date: 3.6 Gy
Reference Point Session Dosage Given: 1.8 Gy
Session Number: 2

## 2022-07-15 NOTE — Progress Notes (Signed)
Symptom Management [place] at [facility] Telephone:([phone] Fax:([phone]) [phone]  Patient Care Team: Latanya Maudlin, NP as PCP - General (Family Medicine) Daiva Huge, RN as Oncology Nurse Navigator Cammie Sickle, [name]   NAME OF PATIENT: Marilyn Page  [id number]  August 19, 1975   DATE OF VISIT: 07/15/22  REASON FOR CONSULT: NORITA Page is a 47 y.o. 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   INTERVAL HISTORY: Patient last saw Dr. Rogue Bussing in 07/09/2022 for cycle 1 of Kadcyla for planned 14 cycles.   Patient presents [facility] today for evaluation of a rash.  She has had an intermittent erythematous and pruritic rash since she received Kadcyla.  She has been applying hydrocortisone cream with good response throughout the day but then the rash returns the following day.  She feels like the rash has somewhat progressed over the past week.  Denies any neurologic complaints. Denies recent fevers or illnesses. Denies any easy bleeding or bruising. Reports good appetite and denies weight loss. Denies chest pain. Denies any nausea, vomiting, constipation, or diarrhea. Denies urinary complaints. Patient offers no further specific complaints today.   PAST MEDICAL HISTORY: Past Medical History:  Diagnosis Date   Anemia    Carcinoma of upper-outer quadrant of right breast in female, estrogen receptor positive ([place]) 10/26/2021   Diabetes mellitus without complication (HCC)    Family history of adverse reaction to anesthesia    grandmother had issues but doesnt know what the issue was, she was older.   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 Axilla Butterfly hydro clip-MACRO METASTATIC MAMMARY CARCINOMA   BREAST BIOPSY Right 04/21/2022   [place] RT PLC BREAST LOC DEV   1ST LESION  INC US GUIDE 04/21/2022 [facility]   BREAST RECONSTRUCTION WITH PLACEMENT OF TISSUE EXPANDER AND FLEX HD (ACELLULAR HYDRATED DERMIS) Right 04/21/2022   Procedure: RIGHT BREAST RECONSTRUCTION WITH PLACEMENT OF TISSUE EXPANDER AND FLEX HD (ACELLULAR HYDRATED DERMIS);  Surgeon: Wallace Going, DO;  Location: [facility]S;  Service: Plastics;  Laterality: Right;   CESAREAN SECTION  2005/2008   DILATION AND CURETTAGE OF UTERUS  2003   PORTACATH PLACEMENT Left 11/09/2021   Procedure: INSERTION PORT-A-CATH;  Surgeon: Robert Bellow, MD;  Location: [facility];  Service: General;  Laterality: Left;   REMOVAL OF TISSUE EXPANDER AND PLACEMENT OF IMPLANT Right 06/08/2022   Procedure: REMOVAL OF TISSUE EXPANDER;  Surgeon: Wallace Going, DO;  Location: [facility];  Service: Plastics;  Laterality: Right;   SIMPLE MASTECTOMY WITH AXILLARY SENTINEL NODE BIOPSY Right 04/21/2022   Procedure: SIMPLE MASTECTOMY WITH AXILLARY SENTINEL NODE BIOPSY;  Surgeon: Robert Bellow, MD;  Location: [facility];  Service: General;  Laterality: Right;  wire localization of lymph node   SKIN BIOPSY Right 11/09/2021   Procedure: PUNCH BIOPSY SKIN, TATTOO LYMPH NODE RIGHT AXILLA;  Surgeon: Robert Bellow, MD;  Location: [facility];  Service: General;  Laterality: Right;   WISDOM TOOTH EXTRACTION      HEMATOLOGY/ONCOLOGY HISTORY:  Oncology  History Overview Note  MAY 2023-    Targeted ultrasound is performed, showing a 2.9 x 1.9 x 2.0 cm irregular hypoechoic mass right breast 10 o'clock position 6 cm from nipple at the site of palpable concern.   There is an abnormal 8 mm thickened right  axillary lymph node.   There is skin thickening overlying the right breast. ------------------  A. BREAST, RIGHT AT 10:00, 6 CM FROM THE NIPPLE; ULTRASOUND-GUIDED CORE  NEEDLE BIOPSY:  - INVASIVE MAMMARY CARCINOMA, NO SPECIAL TYPE.   Size of invasive carcinoma: 11 mm in this sample  Histologic grade of invasive carcinoma: Grade 2                       Glandular/tubular differentiation score: 3                       Nuclear pleomorphism score: 2                       Mitotic rate score: 1                       Total score: 6  Ductal carcinoma in situ: Present, intermediate grade  Lymphovascular invasion: Not identified   B. LYMPH NODE, RIGHT AXILLARY; ULTRASOUND-GUIDED CORE NEEDLE BIOPSY:  - 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 IN GREATEST  EXTENT.   Comment:  The malignancy in the primary breast lesion appears morphologically  different from tumor in the lymph node sampling, with tumor present in  lymph node more suggestive of a histologic grade 3, with significantly  increased mitotic activity and pleomorphism. Due to the discrepancy in  these morphologic patterns, ER/PR/HER2 immunohistochemistry will be  performed on both blocks A1 and B1, with reflex to [place] for HER2 2+. The  results will be reported in an addendum.   ADDENDUM:  CASE SUMMARY: BREAST BIOMARKER TESTS - A - BREAST, RIGHT AT 10:00  Estrogen Receptor (ER) Status: POSITIVE          Percentage of cells with nuclear positivity: 71-80%          Average intensity of staining: Strong   Progesterone Receptor (PgR) Status: POSITIVE          Percentage of cells with nuclear positivity: 81-90%          Average intensity of staining: Strong   HER2 (by immunohistochemistry): POSITIVE (Score 3+)       Percentage of cells with uniform intense complete membrane  staining: 61-70%  HER2 displays a heterogeneous staining pattern, with areas showing a  strong 3+ staining pattern, and other regions with  complete absence (0+)  of staining.   Ki-67: Not performed   CASE SUMMARY: BREAST BIOMARKER TESTS - B - RIGHT AXILLARY LYMPH NODE  Estrogen Receptor (ER) Status: POSITIVE          Percentage of cells with nuclear positivity: 81-90%          Average intensity of staining: Strong   Progesterone Receptor (PgR) Status: POSITIVE          Percentage of cells with nuclear positivity: 51-60%          Average intensity of staining: Strong   HER2 (by immunohistochemistry): NEGATIVE (Score 1+)  Ki-67: Not performed   #  MAY 2023- STAGE III-T4N1 ER/PR positive HER2 POSITIVE right breast cancer;LN-positive-ER/PR positive however 2 NEGATIVE s/p biopsy [see discussion below].  Discussed with Dr. [place] Muta tumor heterogeneity. BREAST MRI- JUNE 2023- The patient's known malignancy in the upper outer quadrant of the right breast measures 3.8 x 2.8 x 3.6 cm. Numerous other suspicious masses are identified in the right breast as above involving the upper outer and upper inner quadrants, located both anterior and posterior to the known malignancy.  Numerous enhancing foci in the skin as above are worrisome for skin metastases ; Bx proven. Biopsy proven metastatic node in the right axilla.  No evidence of malignancy in the left breast;  Two mildly prominent right internal mammary nodes were not FDG avid on recent PET-CT imaging.  Currently on neoadjuvant chemotherapy- TCH plus P x6 cycles;  MUGA scan May 31st, 2023- -61%.    # June 7th, 2023- NEO-ADJUVANT CHEMO- TCH+P x6 cycles- s/p Mastectomy [NOV 15th, 2023] [Dr.Byrnett]-  ypT2 [64m]; ypN0.  # JAN mid, 2024 -TAMOXFEN 20 mg a day; ADD LupronFEB 2nd.2 2024  # FEB 2nd, 2023- Kadcyla q 3 W x14 cycles;   # Lupron q3 M [/start 2/07-2022]   # Genetic counseling s/p- NEG for any deleterious mutations.     Carcinoma of upper-outer quadrant of right breast in [place], estrogen receptor positive [place]  10/26/2021 Initial Diagnosis   Carcinoma of upper-outer  quadrant of right breast in female, estrogen receptor positive ([place]   10/26/2021 Cancer Staging   Staging form: Breast, AJCC 8th Edition - Clinical: Stage IIIA (cT4d, cN1, cM0, G2, ER+, PR+, HER2+) - Signed by BCammie Sickle MD on 06/04/2022 Histologic grading system: 3 grade system   11/10/2021 - 03/01/2022 Chemotherapy   Patient is on Treatment Plan : BREAST  Docetaxel + Carboplatin + Trastuzumab + Pertuzumab  (TCHP) q21d      11/11/2021 - 01/13/2022 Chemotherapy   Patient is on Treatment Plan : BREAST  Docetaxel + Carboplatin + Trastuzumab + Pertuzumab  (TCHP) q21d       Genetic Testing   Negative genetic testing. No pathogenic variants identified on the [facility] panel. The report date is 11/29/2021.  The CancerNext-Expanded + RNAinsight gene panel offered by [employer] includes sequencing and rearrangement analysis for the following 77 genes: IP, ALK, APC*, ATM*, AXIN2, BAP1, BARD1, BLM, BMPR1A, BRCA1*, BRCA2*, BRIP1*, CDC73, CDH1*,CDK4, CDKN1B, CDKN2A, CHEK2*, CTNNA1, DICER1, FANCC, FH, FLCN, GALNT12, KIF1B, LZTR1, MAX, MEN1, MET, MLH1*, MSH2*, MSH3, MSH6*, MUTYH*, NBN, NF1*, NF2, NTHL1, PALB2*, PHOX2B, PMS2*, POT1, PRKAR1A, PTCH1, PTEN*, RAD51C*, RAD51D*,RB1, RECQL, RET, SDHA, SDHAF2, SDHB, SDHC, SDHD, SMAD4, SMARCA4, SMARCB1, SMARCE1, STK11, SUFU, TMEM127, TP53*,TSC1, TSC2, VHL and XRCC2 (sequencing and deletion/duplication); EGFR, EGLN1, HOXB13, KIT, MITF, PDGFRA, POLD1 and POLE (sequencing only); EPCAM and GREM1 (deletion/duplication only).   07/09/2022 -  Chemotherapy   Patient is on Treatment Plan : BREAST ADO-Trastuzumab Emtansine (Kadcyla) q21d       ALLERGIES:  is allergic to metformin and oxycodone.  MEDICATIONS:  Current Outpatient Medications  Medication Sig Dispense Refill   acetaminophen (TYLENOL) 325 MG tablet Take 2 tablets (650 mg total) by mouth every 6 (six) hours as needed for mild pain (or Fever >/= 101).     ascorbic acid (VITAMIN C)  500 MG tablet Take 1 tablet (500 mg total) by mouth daily. 30 tablet    calcium-vitamin D (OSCAL WITH D) 500-5 MG-MCG tablet Take 1 tablet by mouth 2 (two) times daily. 60 tablet 2   Cyanocobalamin (VITAMIN B-12 PO) Take 1 tablet by mouth daily.     ferrous sulfate 325 (65 FE) MG tablet Take 325 mg  by mouth 2 (two) times daily with a meal.     lidocaine-prilocaine (EMLA) cream Apply on the port. 30 -45 min  prior to port access. 30 g 3   Magnesium Cl-Calcium Carbonate (SLOW MAGNESIUM/CALCIUM) 70-117 MG TBEC Take 1 tablet by mouth 2 (two) times daily. 60 tablet 6   Multiple Vitamin (MULTIVITAMIN WITH MINERALS) TABS tablet Take 1 tablet by mouth daily.     mupirocin ointment (BACTROBAN) 2 % Apply 1 Application topically 2 (two) times daily. 30 g 0   ondansetron (ZOFRAN) 4 MG tablet Take 1 tablet (4 mg total) by mouth every 8 (eight) hours as needed for up to 20 doses for nausea or vomiting. (Patient not taking: Reported on 07/02/2022) 20 tablet 0   ondansetron (ZOFRAN-ODT) 8 MG disintegrating tablet Take 8 mg by mouth every 8 (eight) hours as needed for nausea or vomiting. (Patient not taking: Reported on 07/02/2022)     ONETOUCH VERIO test strip SMARTSIG:1 Each Via Meter Daily PRN     potassium chloride (KLOR-CON) 20 MEQ packet Take 20 mEq by mouth 2 (two) times daily. (Patient not taking: Reported on 07/02/2022) 60 packet 3   prochlorperazine (COMPAZINE) 10 MG tablet Take 1 tablet (10 mg total) by mouth every 6 (six) hours as needed for nausea or vomiting. (Patient not taking: Reported on 07/02/2022) 40 tablet 1   tamoxifen (NOLVADEX) 20 MG tablet Take 1 tablet (20 mg total) by mouth daily. 30 tablet 4   No current facility-administered medications for this visit.    VITAL SIGNS: There were no vitals taken for this visit. There were no vitals filed for this visit.  Estimated body mass index is 34.86 kg/m as calculated from the following:   Height as of 06/08/22: '5\' 6"'$  (1.676 m).   Weight as of  07/09/22: 216 lb (98 kg).  LABS: CBC:    Component Value Date/Time   WBC 5.7 07/02/2022 1029   HGB 10.6 (L) 07/02/2022 1029   HGB 7.8 (L) 04/23/2021 1431   HCT 32.8 (L) 07/02/2022 1029   HCT 25.5 (L) 04/23/2021 1431   PLT 211 07/02/2022 1029   PLT 384 04/23/2021 1431   MCV 86.1 07/02/2022 1029   MCV 84 04/23/2021 1431   NEUTROABS 3.3 07/02/2022 1029   NEUTROABS 5.4 04/23/2021 1431   LYMPHSABS 1.9 07/02/2022 1029   LYMPHSABS 2.2 04/23/2021 1431   MONOABS 0.3 07/02/2022 1029   EOSABS 0.2 07/02/2022 1029   EOSABS 0.2 04/23/2021 1431   BASOSABS 0.0 07/02/2022 1029   BASOSABS 0.1 04/23/2021 1431   Comprehensive Metabolic Panel:    Component Value Date/Time   NA 138 07/02/2022 1029   NA 143 09/26/2017 1512   K 3.4 (L) 07/02/2022 1029   CL 103 07/02/2022 1029   CO2 26 07/02/2022 1029   BUN 14 07/02/2022 1029   BUN 9 09/26/2017 1512   CREATININE 0.67 07/02/2022 1029   GLUCOSE 202 (H) 07/02/2022 1029   CALCIUM 8.7 (L) 07/02/2022 1029   AST 23 07/02/2022 1029   ALT 17 07/02/2022 1029   ALKPHOS 55 07/02/2022 1029   BILITOT 0.5 07/02/2022 1029   BILITOT <0.2 09/26/2017 1512   PROT 6.9 07/02/2022 1029   PROT 6.8 09/26/2017 1512   ALBUMIN 3.3 (L) 07/02/2022 1029   ALBUMIN 3.9 09/26/2017 1512    RADIOGRAPHIC STUDIES: NM Cardiac Muga Rest  Result Date: 07/05/2022 CLINICAL DATA:  Breast cancer, cardiotoxic chemotherapy EXAM: NUCLEAR MEDICINE CARDIAC BLOOD POOL IMAGING (MUGA) TECHNIQUE: Cardiac multi-gated acquisition was performed at  rest following intravenous injection of Tc-54mlabeled red blood cells. RADIOPHARMACEUTICALS:  22.01 mCi Tc-954mertechnetate in-vitro labeled red blood cells IV COMPARISON:  11/04/2021 FINDINGS: Calculated LEFT ventricular ejection fraction is 57.1%, normal, slightly decreased from the 62.1% on the prior exam. Study was obtained at a cardiac rate of 71 bpm. Patient was fairly rhythmic during imaging. Cine analysis of the LEFT ventricle in 3 projections  demonstrates normal LEFT ventricular wall motion. IMPRESSION: Normal LEFT ventricular ejection fraction of 57.1% slightly decreased from the 62.1% on the prior study of 11/04/2021. No focal LEFT ventricular wall motion abnormalities. Electronically Signed   By: MaLavonia Dana.D.   On: 07/05/2022 12:14    PERFORMANCE STATUS (ECOG) : 1 - Symptomatic but completely ambulatory  Review of Systems Unless otherwise noted, a complete review of systems is negative.  Physical Exam General: NAD Pulmonary: Unlabored Extremities: no edema, no joint deformities Skin: Slight erythematous rash to left flank Neurological: Nonfocal    IMPRESSION/PLAN: Rash -likely drug-induced.  Patient reports that rash has been responsive to hydrocortisone cream.  Continue topical steroid and recommend adding daily loratadine.  Patient will keep us[place]oreanformed if no improvement, would consider low-dose steroid.  Discussed ED triggers in detail.  Case and plan discussed with Dr. BrRogue Bussing Patient expressed understanding and was in agreement with this plan. She also understands that She can call clinic at any time with any questions, concerns, or complaints.   Thank you for allowing me to participate in the care of this very pleasant patient.   Time Total: 15 minutes  Visit consisted of counseling and education dealing with the complex and emotionally intense issues of symptom management in the setting of serious illness.Greater than 50%  of this time was spent counseling and coordinating care related to the above assessment and plan.  Signed by: JoAltha HarmPhD, NP-C

## 2022-07-15 NOTE — Progress Notes (Signed)
Patient states on Monday 2/5 she woke up with 2 small spots (Rash like spots). Patient states she used cortisone cream and spots cleared up throughout the day. Patient woke up on Tuesday 2/6 with a few more spots. Patient states rash likes spots go away thought out the day but when wakes up the next day the rash like spots reappear. Patient states she woke up with a swollen lip this morning which also has went down.

## 2022-07-16 ENCOUNTER — Ambulatory Visit
Admission: RE | Admit: 2022-07-16 | Discharge: 2022-07-16 | Disposition: A | Discharge status: Home / Self Care | Payer: 59 | Source: Ambulatory Visit | Attending: Radiation Oncology | Admitting: Radiation Oncology

## 2022-07-16 ENCOUNTER — Telehealth: Payer: Self-pay | Admitting: Hospice and Palliative Medicine

## 2022-07-16 ENCOUNTER — Ambulatory Visit: Payer: 59

## 2022-07-16 ENCOUNTER — Other Ambulatory Visit: Payer: Self-pay

## 2022-07-16 DIAGNOSIS — Z510 Encounter for antineoplastic radiation therapy: Secondary | ICD-10-CM | POA: Diagnosis not present

## 2022-07-16 DIAGNOSIS — C50411 Malignant neoplasm of upper-outer quadrant of right female breast: Secondary | ICD-10-CM | POA: Diagnosis not present

## 2022-07-16 DIAGNOSIS — Z5111 Encounter for antineoplastic chemotherapy: Secondary | ICD-10-CM | POA: Diagnosis not present

## 2022-07-16 LAB — RAD ONC ARIA SESSION SUMMARY
Course Elapsed Days: 2
Plan Fractions Treated to Date: 3
Plan Prescribed Dose Per Fraction: 1.8 Gy
Plan Total Fractions Prescribed: 28
Plan Total Prescribed Dose: 50.4 Gy
Reference Point Dosage Given to Date: 5.4 Gy
Reference Point Session Dosage Given: 1.8 Gy
Session Number: 3

## 2022-07-16 NOTE — Telephone Encounter (Signed)
I called and spoke with patient by phone.  She reports that she is feeling good today without any acute changes or concerns.  She says that the rash resolved with a single dose of loratadine and has not recurred.  She will continue to monitor for changes and contact us if needed.

## 2022-07-19 ENCOUNTER — Ambulatory Visit: Payer: 59

## 2022-07-19 ENCOUNTER — Ambulatory Visit
Admission: RE | Admit: 2022-07-19 | Discharge: 2022-07-19 | Disposition: A | Discharge status: Home / Self Care | Payer: 59 | Source: Ambulatory Visit | Attending: Radiation Oncology | Admitting: Radiation Oncology

## 2022-07-19 ENCOUNTER — Other Ambulatory Visit: Payer: Self-pay

## 2022-07-19 DIAGNOSIS — Z5111 Encounter for antineoplastic chemotherapy: Secondary | ICD-10-CM | POA: Diagnosis not present

## 2022-07-19 DIAGNOSIS — Z510 Encounter for antineoplastic radiation therapy: Secondary | ICD-10-CM | POA: Diagnosis not present

## 2022-07-19 DIAGNOSIS — C50411 Malignant neoplasm of upper-outer quadrant of right female breast: Secondary | ICD-10-CM | POA: Diagnosis not present

## 2022-07-19 LAB — RAD ONC ARIA SESSION SUMMARY
Course Elapsed Days: 5
Plan Fractions Treated to Date: 4
Plan Prescribed Dose Per Fraction: 1.8 Gy
Plan Total Fractions Prescribed: 28
Plan Total Prescribed Dose: 50.4 Gy
Reference Point Dosage Given to Date: 7.2 Gy
Reference Point Session Dosage Given: 1.8 Gy
Session Number: 4

## 2022-07-20 ENCOUNTER — Ambulatory Visit: Payer: 59

## 2022-07-20 ENCOUNTER — Other Ambulatory Visit: Payer: Self-pay

## 2022-07-20 ENCOUNTER — Ambulatory Visit
Admission: RE | Admit: 2022-07-20 | Discharge: 2022-07-20 | Disposition: A | Discharge status: Home / Self Care | Payer: 59 | Source: Ambulatory Visit | Attending: Radiation Oncology | Admitting: Radiation Oncology

## 2022-07-20 DIAGNOSIS — C50411 Malignant neoplasm of upper-outer quadrant of right female breast: Secondary | ICD-10-CM | POA: Diagnosis not present

## 2022-07-20 DIAGNOSIS — Z5111 Encounter for antineoplastic chemotherapy: Secondary | ICD-10-CM | POA: Diagnosis not present

## 2022-07-20 DIAGNOSIS — Z510 Encounter for antineoplastic radiation therapy: Secondary | ICD-10-CM | POA: Diagnosis not present

## 2022-07-20 LAB — RAD ONC ARIA SESSION SUMMARY
Course Elapsed Days: 6
Plan Fractions Treated to Date: 5
Plan Prescribed Dose Per Fraction: 1.8 Gy
Plan Total Fractions Prescribed: 28
Plan Total Prescribed Dose: 50.4 Gy
Reference Point Dosage Given to Date: 9 Gy
Reference Point Session Dosage Given: 1.8 Gy
Session Number: 5

## 2022-07-21 ENCOUNTER — Ambulatory Visit: Payer: 59

## 2022-07-21 ENCOUNTER — Ambulatory Visit
Admission: RE | Admit: 2022-07-21 | Discharge: 2022-07-21 | Disposition: A | Discharge status: Home / Self Care | Payer: 59 | Source: Ambulatory Visit | Attending: Radiation Oncology | Admitting: Radiation Oncology

## 2022-07-21 ENCOUNTER — Other Ambulatory Visit: Payer: Self-pay

## 2022-07-21 DIAGNOSIS — Z510 Encounter for antineoplastic radiation therapy: Secondary | ICD-10-CM | POA: Diagnosis not present

## 2022-07-21 DIAGNOSIS — C50411 Malignant neoplasm of upper-outer quadrant of right female breast: Secondary | ICD-10-CM | POA: Diagnosis not present

## 2022-07-21 DIAGNOSIS — Z5111 Encounter for antineoplastic chemotherapy: Secondary | ICD-10-CM | POA: Diagnosis not present

## 2022-07-21 LAB — RAD ONC ARIA SESSION SUMMARY
Course Elapsed Days: 7
Plan Fractions Treated to Date: 6
Plan Prescribed Dose Per Fraction: 1.8 Gy
Plan Total Fractions Prescribed: 28
Plan Total Prescribed Dose: 50.4 Gy
Reference Point Dosage Given to Date: 10.8 Gy
Reference Point Session Dosage Given: 1.8 Gy
Session Number: 6

## 2022-07-22 ENCOUNTER — Ambulatory Visit
Admission: RE | Admit: 2022-07-22 | Discharge: 2022-07-22 | Disposition: A | Discharge status: Home / Self Care | Payer: 59 | Source: Ambulatory Visit | Attending: Radiation Oncology | Admitting: Radiation Oncology

## 2022-07-22 ENCOUNTER — Other Ambulatory Visit: Payer: Self-pay

## 2022-07-22 ENCOUNTER — Inpatient Hospital Stay: Payer: 59

## 2022-07-22 ENCOUNTER — Ambulatory Visit: Payer: 59

## 2022-07-22 DIAGNOSIS — Z510 Encounter for antineoplastic radiation therapy: Secondary | ICD-10-CM | POA: Diagnosis not present

## 2022-07-22 DIAGNOSIS — Z5111 Encounter for antineoplastic chemotherapy: Secondary | ICD-10-CM | POA: Diagnosis not present

## 2022-07-22 DIAGNOSIS — Z170 Estrogen receptor positive status [ER+]: Secondary | ICD-10-CM

## 2022-07-22 DIAGNOSIS — C50411 Malignant neoplasm of upper-outer quadrant of right female breast: Secondary | ICD-10-CM | POA: Diagnosis not present

## 2022-07-22 LAB — RAD ONC ARIA SESSION SUMMARY
Course Elapsed Days: 8
Plan Fractions Treated to Date: 7
Plan Prescribed Dose Per Fraction: 1.8 Gy
Plan Total Fractions Prescribed: 28
Plan Total Prescribed Dose: 50.4 Gy
Reference Point Dosage Given to Date: 12.6 Gy
Reference Point Session Dosage Given: 1.8 Gy
Session Number: 7

## 2022-07-22 LAB — CBC
HCT: 35 % — ABNORMAL LOW (ref 36.0–46.0)
Hemoglobin: 11.2 g/dL — ABNORMAL LOW (ref 12.0–15.0)
MCH: 27.7 pg (ref 26.0–34.0)
MCHC: 32 g/dL (ref 30.0–36.0)
MCV: 86.4 fL (ref 80.0–100.0)
Platelets: 168 10*3/uL (ref 150–400)
RBC: 4.05 MIL/uL (ref 3.87–5.11)
RDW: 14.3 % (ref 11.5–15.5)
WBC: 5.6 10*3/uL (ref 4.0–10.5)
nRBC: 0 % (ref 0.0–0.2)

## 2022-07-23 ENCOUNTER — Other Ambulatory Visit: Payer: Self-pay

## 2022-07-23 ENCOUNTER — Ambulatory Visit
Admission: RE | Admit: 2022-07-23 | Discharge: 2022-07-23 | Disposition: A | Discharge status: Home / Self Care | Payer: 59 | Source: Ambulatory Visit | Attending: Radiation Oncology | Admitting: Radiation Oncology

## 2022-07-23 ENCOUNTER — Ambulatory Visit: Payer: 59

## 2022-07-23 DIAGNOSIS — C50411 Malignant neoplasm of upper-outer quadrant of right female breast: Secondary | ICD-10-CM | POA: Diagnosis not present

## 2022-07-23 DIAGNOSIS — Z5111 Encounter for antineoplastic chemotherapy: Secondary | ICD-10-CM | POA: Diagnosis not present

## 2022-07-23 DIAGNOSIS — Z510 Encounter for antineoplastic radiation therapy: Secondary | ICD-10-CM | POA: Diagnosis not present

## 2022-07-23 LAB — RAD ONC ARIA SESSION SUMMARY
Course Elapsed Days: 9
Plan Fractions Treated to Date: 8
Plan Prescribed Dose Per Fraction: 1.8 Gy
Plan Total Fractions Prescribed: 28
Plan Total Prescribed Dose: 50.4 Gy
Reference Point Dosage Given to Date: 14.4 Gy
Reference Point Session Dosage Given: 1.8 Gy
Session Number: 8

## 2022-07-25 ENCOUNTER — Other Ambulatory Visit: Payer: Self-pay | Admitting: Internal Medicine

## 2022-07-26 ENCOUNTER — Other Ambulatory Visit: Payer: Self-pay

## 2022-07-26 ENCOUNTER — Ambulatory Visit: Payer: 59

## 2022-07-26 ENCOUNTER — Ambulatory Visit
Admission: RE | Admit: 2022-07-26 | Discharge: 2022-07-26 | Disposition: A | Discharge status: Home / Self Care | Payer: 59 | Source: Ambulatory Visit | Attending: Radiation Oncology | Admitting: Radiation Oncology

## 2022-07-26 DIAGNOSIS — Z5111 Encounter for antineoplastic chemotherapy: Secondary | ICD-10-CM | POA: Diagnosis not present

## 2022-07-26 DIAGNOSIS — C50411 Malignant neoplasm of upper-outer quadrant of right female breast: Secondary | ICD-10-CM | POA: Diagnosis not present

## 2022-07-26 DIAGNOSIS — Z510 Encounter for antineoplastic radiation therapy: Secondary | ICD-10-CM | POA: Diagnosis not present

## 2022-07-26 LAB — RAD ONC ARIA SESSION SUMMARY
Course Elapsed Days: 12
Plan Fractions Treated to Date: 9
Plan Prescribed Dose Per Fraction: 1.8 Gy
Plan Total Fractions Prescribed: 28
Plan Total Prescribed Dose: 50.4 Gy
Reference Point Dosage Given to Date: 16.2 Gy
Reference Point Session Dosage Given: 1.8 Gy
Session Number: 9

## 2022-07-27 ENCOUNTER — Ambulatory Visit: Payer: 59

## 2022-07-27 ENCOUNTER — Ambulatory Visit
Admission: RE | Admit: 2022-07-27 | Discharge: 2022-07-27 | Disposition: A | Discharge status: Home / Self Care | Payer: 59 | Source: Ambulatory Visit | Attending: Radiation Oncology | Admitting: Radiation Oncology

## 2022-07-27 ENCOUNTER — Other Ambulatory Visit: Payer: Self-pay

## 2022-07-27 DIAGNOSIS — C50411 Malignant neoplasm of upper-outer quadrant of right female breast: Secondary | ICD-10-CM | POA: Diagnosis not present

## 2022-07-27 DIAGNOSIS — Z5111 Encounter for antineoplastic chemotherapy: Secondary | ICD-10-CM | POA: Diagnosis not present

## 2022-07-27 DIAGNOSIS — Z510 Encounter for antineoplastic radiation therapy: Secondary | ICD-10-CM | POA: Diagnosis not present

## 2022-07-27 LAB — RAD ONC ARIA SESSION SUMMARY
Course Elapsed Days: 13
Plan Fractions Treated to Date: 10
Plan Prescribed Dose Per Fraction: 1.8 Gy
Plan Total Fractions Prescribed: 28
Plan Total Prescribed Dose: 50.4 Gy
Reference Point Dosage Given to Date: 18 Gy
Reference Point Session Dosage Given: 1.8 Gy
Session Number: 10

## 2022-07-28 ENCOUNTER — Other Ambulatory Visit: Payer: Self-pay

## 2022-07-28 ENCOUNTER — Ambulatory Visit
Admission: RE | Admit: 2022-07-28 | Discharge: 2022-07-28 | Disposition: A | Discharge status: Home / Self Care | Payer: 59 | Source: Ambulatory Visit | Attending: Radiation Oncology | Admitting: Radiation Oncology

## 2022-07-28 ENCOUNTER — Ambulatory Visit: Payer: 59

## 2022-07-28 DIAGNOSIS — Z510 Encounter for antineoplastic radiation therapy: Secondary | ICD-10-CM | POA: Diagnosis not present

## 2022-07-28 DIAGNOSIS — C50411 Malignant neoplasm of upper-outer quadrant of right female breast: Secondary | ICD-10-CM | POA: Diagnosis not present

## 2022-07-28 DIAGNOSIS — Z5111 Encounter for antineoplastic chemotherapy: Secondary | ICD-10-CM | POA: Diagnosis not present

## 2022-07-28 LAB — RAD ONC ARIA SESSION SUMMARY
Course Elapsed Days: 14
Plan Fractions Treated to Date: 11
Plan Prescribed Dose Per Fraction: 1.8 Gy
Plan Total Fractions Prescribed: 28
Plan Total Prescribed Dose: 50.4 Gy
Reference Point Dosage Given to Date: 19.8 Gy
Reference Point Session Dosage Given: 1.8 Gy
Session Number: 11

## 2022-07-29 ENCOUNTER — Other Ambulatory Visit: Payer: Self-pay | Admitting: Internal Medicine

## 2022-07-29 ENCOUNTER — Other Ambulatory Visit: Payer: Self-pay

## 2022-07-29 ENCOUNTER — Other Ambulatory Visit: Payer: Self-pay | Admitting: *Deleted

## 2022-07-29 ENCOUNTER — Telehealth: Payer: Self-pay | Admitting: Internal Medicine

## 2022-07-29 ENCOUNTER — Ambulatory Visit: Payer: 59

## 2022-07-29 ENCOUNTER — Other Ambulatory Visit: Payer: 59

## 2022-07-29 ENCOUNTER — Ambulatory Visit
Admission: RE | Admit: 2022-07-29 | Discharge: 2022-07-29 | Disposition: A | Discharge status: Home / Self Care | Payer: 59 | Source: Ambulatory Visit | Attending: Radiation Oncology | Admitting: Radiation Oncology

## 2022-07-29 DIAGNOSIS — Z5111 Encounter for antineoplastic chemotherapy: Secondary | ICD-10-CM | POA: Diagnosis not present

## 2022-07-29 DIAGNOSIS — Z510 Encounter for antineoplastic radiation therapy: Secondary | ICD-10-CM | POA: Diagnosis not present

## 2022-07-29 DIAGNOSIS — C50411 Malignant neoplasm of upper-outer quadrant of right female breast: Secondary | ICD-10-CM | POA: Diagnosis not present

## 2022-07-29 DIAGNOSIS — Z170 Estrogen receptor positive status [ER+]: Secondary | ICD-10-CM

## 2022-07-29 LAB — RAD ONC ARIA SESSION SUMMARY
Course Elapsed Days: 15
Plan Fractions Treated to Date: 12
Plan Prescribed Dose Per Fraction: 1.8 Gy
Plan Total Fractions Prescribed: 28
Plan Total Prescribed Dose: 50.4 Gy
Reference Point Dosage Given to Date: 21.6 Gy
Reference Point Session Dosage Given: 1.8 Gy
Session Number: 12

## 2022-07-29 NOTE — Telephone Encounter (Signed)
H/M-given the history of rash/fever after the infusion.  I recommend patient take Tylenol the night before infusion; and evening of infusion.   And also start taking claritin one a day; start the night before infusion x 3 days.   Pt due for infusion on 2/23.   Please inform patient that we will discuss further at tomorrow's appointment.  GB

## 2022-07-30 ENCOUNTER — Inpatient Hospital Stay: Payer: 59

## 2022-07-30 ENCOUNTER — Other Ambulatory Visit: Payer: Self-pay

## 2022-07-30 ENCOUNTER — Inpatient Hospital Stay (HOSPITAL_BASED_OUTPATIENT_CLINIC_OR_DEPARTMENT_OTHER): Payer: 59 | Admitting: Internal Medicine

## 2022-07-30 ENCOUNTER — Ambulatory Visit
Admission: RE | Admit: 2022-07-30 | Discharge: 2022-07-30 | Disposition: A | Discharge status: Home / Self Care | Payer: 59 | Source: Ambulatory Visit | Attending: Radiation Oncology | Admitting: Radiation Oncology

## 2022-07-30 ENCOUNTER — Ambulatory Visit: Payer: 59

## 2022-07-30 ENCOUNTER — Encounter: Payer: Self-pay | Admitting: Internal Medicine

## 2022-07-30 VITALS — BP 115/79 | HR 71 | Temp 96.9°F | Resp 18 | Ht 66.0 in | Wt 218.1 lb

## 2022-07-30 DIAGNOSIS — Z510 Encounter for antineoplastic radiation therapy: Secondary | ICD-10-CM | POA: Diagnosis not present

## 2022-07-30 DIAGNOSIS — C50411 Malignant neoplasm of upper-outer quadrant of right female breast: Secondary | ICD-10-CM | POA: Diagnosis not present

## 2022-07-30 DIAGNOSIS — Z170 Estrogen receptor positive status [ER+]: Secondary | ICD-10-CM

## 2022-07-30 DIAGNOSIS — Z5111 Encounter for antineoplastic chemotherapy: Secondary | ICD-10-CM | POA: Diagnosis not present

## 2022-07-30 LAB — RAD ONC ARIA SESSION SUMMARY
Course Elapsed Days: 16
Plan Fractions Treated to Date: 13
Plan Prescribed Dose Per Fraction: 1.8 Gy
Plan Total Fractions Prescribed: 28
Plan Total Prescribed Dose: 50.4 Gy
Reference Point Dosage Given to Date: 23.4 Gy
Reference Point Session Dosage Given: 1.8 Gy
Session Number: 13

## 2022-07-30 LAB — CBC WITH DIFFERENTIAL/PLATELET
Abs Immature Granulocytes: 0.03 10*3/uL (ref 0.00–0.07)
Basophils Absolute: 0 10*3/uL (ref 0.0–0.1)
Basophils Relative: 1 %
Eosinophils Absolute: 0.1 10*3/uL (ref 0.0–0.5)
Eosinophils Relative: 2 %
HCT: 32.5 % — ABNORMAL LOW (ref 36.0–46.0)
Hemoglobin: 10.6 g/dL — ABNORMAL LOW (ref 12.0–15.0)
Immature Granulocytes: 1 %
Lymphocytes Relative: 27 %
Lymphs Abs: 1.4 10*3/uL (ref 0.7–4.0)
MCH: 27.6 pg (ref 26.0–34.0)
MCHC: 32.6 g/dL (ref 30.0–36.0)
MCV: 84.6 fL (ref 80.0–100.0)
Monocytes Absolute: 0.3 10*3/uL (ref 0.1–1.0)
Monocytes Relative: 7 %
Neutro Abs: 3.1 10*3/uL (ref 1.7–7.7)
Neutrophils Relative %: 62 %
Platelets: 193 10*3/uL (ref 150–400)
RBC: 3.84 MIL/uL — ABNORMAL LOW (ref 3.87–5.11)
RDW: 14.6 % (ref 11.5–15.5)
WBC: 5 10*3/uL (ref 4.0–10.5)
nRBC: 0 % (ref 0.0–0.2)

## 2022-07-30 LAB — COMPREHENSIVE METABOLIC PANEL
ALT: 15 U/L (ref 0–44)
AST: 19 U/L (ref 15–41)
Albumin: 2.9 g/dL — ABNORMAL LOW (ref 3.5–5.0)
Alkaline Phosphatase: 50 U/L (ref 38–126)
Anion gap: 8 (ref 5–15)
BUN: 15 mg/dL (ref 6–20)
CO2: 27 mmol/L (ref 22–32)
Calcium: 9.2 mg/dL (ref 8.9–10.3)
Chloride: 101 mmol/L (ref 98–111)
Creatinine, Ser: 0.71 mg/dL (ref 0.44–1.00)
GFR, Estimated: >60 mL/min (ref >60)
Glucose, Bld: 209 mg/dL — ABNORMAL HIGH (ref 70–99)
Potassium: 3.3 mmol/L — ABNORMAL LOW (ref 3.5–5.1)
Sodium: 136 mmol/L (ref 135–145)
Total Bilirubin: 0.5 mg/dL (ref 0.3–1.2)
Total Protein: 6.4 g/dL — ABNORMAL LOW (ref 6.5–8.1)

## 2022-07-30 LAB — VITAMIN D 25 HYDROXY (VIT D DEFICIENCY, FRACTURES): Vit D, 25-Hydroxy: 51.8 ng/mL (ref 30–100)

## 2022-07-30 MED ORDER — PROCHLORPERAZINE MALEATE 10 MG PO TABS
10.0000 mg | ORAL_TABLET | Freq: Once | ORAL | Status: AC
  Administered 2022-07-30: 10 mg via ORAL
  Filled 2022-07-30: qty 1

## 2022-07-30 MED ORDER — SODIUM CHLORIDE 0.9 % IV SOLN
Freq: Once | INTRAVENOUS | Status: AC
  Filled 2022-07-30: qty 250

## 2022-07-30 MED ORDER — DIPHENHYDRAMINE HCL 25 MG PO CAPS
25.0000 mg | ORAL_CAPSULE | Freq: Once | ORAL | Status: AC
  Administered 2022-07-30: 25 mg via ORAL
  Filled 2022-07-30: qty 1

## 2022-07-30 MED ORDER — ACETAMINOPHEN 325 MG PO TABS: 650.0000 mg | ORAL_TABLET | Freq: Once | ORAL | Status: DC

## 2022-07-30 MED ORDER — SODIUM CHLORIDE 0.9 % IV SOLN
3.6000 mg/kg | Freq: Once | INTRAVENOUS | Status: AC
  Administered 2022-07-30: 360 mg via INTRAVENOUS
  Filled 2022-07-30: qty 8

## 2022-07-30 MED ORDER — HEPARIN SOD (PORK) LOCK FLUSH 100 UNIT/ML IV SOLN
500.0000 [IU] | Freq: Once | INTRAVENOUS | Status: AC | PRN
  Administered 2022-07-30: 500 [IU]
  Filled 2022-07-30: qty 5

## 2022-07-30 NOTE — Assessment & Plan Note (Addendum)
#   MAY 2023- STAGE III-T4N1 ER/PR positive HER2 POSITIVE right breast cancer; LN-positive-ER/PR positive her-2 NEGATIVE; # Currently s/p neoadjuvant chemotherapy- TCH plus P x 6 cycles- s/p Riht mastectomy-[15th, NOv 2023] ypT2 [76m]; ypN0.  Given partial response/involvement of the breast skin/multifocal disease-s/p evaluation with Dr. CDonella Stade  Awaiting to start radiation; planned sim on 1/29.  Patient currently on tamoxifen [DEC-JAN 2024]; + OFS- Lupron every 3 months ]. ON Ado-trastuzumab emtansine  for 14 cycles. RT until 3/22.   # proceed with Ado-trastuzumab emtansine #2 of planned 14 cycles.  Patient tolerated treatment well except for-rash/fever see below     MUGA scan May 31st, 2023- -61%. JAN 2024- Normal LEFT ventricular ejection fraction of 57.1% .  # Allergic reaction to Ado-trastuzumab emtansine-fever/rash.  Improved with Claritin. patient currently on Claritin x3 days postchemotherapy; Tylenol preemptively.  Decrease Benadryl 25 mg given drowsiness.  # Discussed the role of Zometa every 6 months for 3 years/on adjuvant basis.   Continue ca+Vit D BID. Will plan at next visit; ordered today.   # Mastectomy site infection/cellulitis currently on-antibiotics as per plastic surgery. S/p evaluation with plastic surgery today.Stable.  # Hypocalcemia- Continue calcium 1200 plus vitamin D-3 1000-over-the-counter-1 pill a day.  Penidng- vit D 25-OH levels  # Hypokalemia: K dur 3.3- recommend compliance with at least once a day.   # PN G-1-2 sec to Taxotere- monitor for now. Stable.   # PV access: port functional  Lupron 07/09/2022 #1 q 7M; Zometa q 6 M  # DISPOSITION: # Kadcyla today.  # follow up in 3 week- MD;  port- labs; cbc/cmp; Kadcyla; zometa  Dr.B

## 2022-07-30 NOTE — Progress Notes (Signed)
Did take Claritin and Tylenol last night.  Also took 2 Tylenol this morning.  Would like to know if she can take 1 Benadryl in pre-med today due to 2 Benadryl cause excessive drowsiness.    Has not started back on oral Potassium.  Thinks this could be causing itch.  Slight improvement in neuropathy and fingernail health.

## 2022-07-30 NOTE — Patient Instructions (Signed)
[place]  Discharge Instructions: Thank you for choosing [facility] to provide your oncology and hematology care.  If you have a lab appointment with the [facility], please go directly to the [facility] and check in at the registration area.  Wear comfortable clothing and clothing appropriate for easy access to any Portacath or PICC line.   We strive to give you quality time with your provider. You may need to reschedule your appointment if you arrive late (15 or more minutes).  Arriving late affects you and other patients whose appointments are after yours.  Also, if you miss three or more appointments without notifying the office, you may be dismissed from the clinic at the provider's discretion.      For prescription refill requests, have your pharmacy contact our office and allow 72 hours for refills to be completed.    Today you received the following chemotherapy and/or immunotherapy agents Kadylca.      To help prevent nausea and vomiting after your treatment, we encourage you to take your nausea medication as directed.  BELOW ARE SYMPTOMS THAT SHOULD BE REPORTED IMMEDIATELY: *FEVER GREATER THAN 100.4 F (38 C) OR HIGHER *CHILLS OR SWEATING *NAUSEA AND VOMITING THAT IS NOT CONTROLLED WITH YOUR NAUSEA MEDICATION *UNUSUAL SHORTNESS OF BREATH *UNUSUAL BRUISING OR BLEEDING *URINARY PROBLEMS (pain or burning when urinating, or frequent urination) *BOWEL PROBLEMS (unusual diarrhea, constipation, pain near the anus) TENDERNESS IN MOUTH AND THROAT WITH OR WITHOUT PRESENCE OF ULCERS (sore throat, sores in mouth, or a toothache) UNUSUAL RASH, SWELLING OR PAIN  UNUSUAL VAGINAL DISCHARGE OR ITCHING   Items with * indicate a potential emergency and should be followed up as soon as possible or go to the Emergency Department if any problems should occur.  Please show the CHEMOTHERAPY ALERT CARD or IMMUNOTHERAPY ALERT CARD at check-in to  the Emergency Department and triage nurse.  Should you have questions after your visit or need to cancel or reschedule your appointment, please contact [facility]  [phone] and follow the prompts.  Office hours are 8:00 a.m. to 4:30 p.m. Monday - Friday. Please note that voicemails left after 4:00 p.m. may not be returned until the following business day.  We are closed weekends and major holidays. You have access to a nurse at all times for urgent questions. Please call the main number to the clinic [phone] and follow the prompts.  For any non-urgent questions, you may also contact your provider using MyChart. We now offer e-Visits for anyone 34 and older to request care online for non-urgent symptoms. For details visit [email or web address].   Also download the MyChart app! Go to the app store, search "MyChart", open the app, select [facility], and log in with your MyChart username and password.

## 2022-07-30 NOTE — Progress Notes (Signed)
[name] NOTE  Patient Care Team: Latanya Maudlin, NP as PCP - General (Family Medicine) Daiva Huge, RN as Oncology Nurse Navigator Cammie Sickle, [name]  CHIEF COMPLAINTS/PURPOSE OF CONSULTATION: Breast cancer  Oncology History Overview Note  MAY 2023-    Targeted ultrasound is performed, showing a 2.9 x 1.9 x 2.0 cm irregular hypoechoic mass right breast 10 o'clock position 6 cm from nipple at the site of palpable concern.   There is an abnormal 8 mm thickened right axillary lymph node.   There is skin thickening overlying the right breast. ------------------  A. BREAST, RIGHT AT 10:00, 6 CM FROM THE NIPPLE; ULTRASOUND-GUIDED CORE  NEEDLE BIOPSY:  - INVASIVE MAMMARY CARCINOMA, NO SPECIAL TYPE.   Size of invasive carcinoma: 11 mm in this sample  Histologic grade of invasive carcinoma: Grade 2                       Glandular/tubular differentiation score: 3                       Nuclear pleomorphism score: 2                       Mitotic rate score: 1                       Total score: 6  Ductal carcinoma in situ: Present, intermediate grade  Lymphovascular invasion: Not identified   B. LYMPH NODE, RIGHT AXILLARY; ULTRASOUND-GUIDED CORE NEEDLE BIOPSY:  - 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 IN GREATEST  EXTENT.   Comment:  The malignancy in the primary breast lesion appears morphologically  different from tumor in the lymph node sampling, with tumor present in  lymph node more suggestive of a histologic grade 3, with significantly  increased mitotic activity and pleomorphism. Due to the discrepancy in  these morphologic patterns, ER/PR/HER2 immunohistochemistry will be  performed on both blocks A1 and B1, with reflex to [place] for HER2 2+. The  results will be reported in an addendum.   ADDENDUM:  CASE SUMMARY: BREAST BIOMARKER TESTS - A - BREAST, RIGHT AT 10:00  Estrogen Receptor  (ER) Status: POSITIVE          Percentage of cells with nuclear positivity: 71-80%          Average intensity of staining: Strong   Progesterone Receptor (PgR) Status: POSITIVE          Percentage of cells with nuclear positivity: 81-90%          Average intensity of staining: Strong   HER2 (by immunohistochemistry): POSITIVE (Score 3+)       Percentage of cells with uniform intense complete membrane  staining: 61-70%  HER2 displays a heterogeneous staining pattern, with areas showing a  strong 3+ staining pattern, and other regions with complete absence (0+)  of staining.   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  Discussed with Dr. Sedalia Muta tumor heterogeneity. BREAST MRI- JUNE 2023- The patient's known malignancy in the upper outer quadrant of the right breast measures 3.8 x 2.8 x 3.6 cm. Numerous other suspicious masses are identified in the right breast as above involving the upper outer and upper inner quadrants, located both anterior and posterior to the known malignancy.  Numerous enhancing foci in the skin as above are worrisome for skin metastases ; Bx proven. Biopsy proven metastatic node in the right axilla.  No evidence of malignancy in the left breast;  Two mildly prominent right internal mammary nodes were not FDG avid on recent PET-CT imaging.  Currently on neoadjuvant chemotherapy- TCH  plus P x6 cycles;  MUGA scan May 31st, 2023- -61%.    # June 7th, 2023- NEO-ADJUVANT CHEMO- TCH+P x6 cycles- s/p Mastectomy [NOV 15th, 2023] [Dr.Byrnett]-  ypT2 [37m]; ypN0.  # JAN mid, 2024 -TAMOXFEN 20 mg a day; ADD LupronFEB 2nd.2 2024  # FEB 2nd, 2023- Kadcyla q 3 W x14 cycles;   # Lupron q3 M [/start 2/07-2022]   # Genetic counseling s/p- NEG for any deleterious mutations.     Carcinoma of upper-outer quadrant of right breast in female, estrogen receptor positive ([facility]  10/26/2021 Initial Diagnosis   Carcinoma of upper-outer quadrant of right breast in female, estrogen receptor positive ([facility]   10/26/2021 Cancer Staging   Staging form: Breast, AJCC 8th Edition - Clinical: Stage IIIA (cT4d, cN1, cM0, G2, ER+, PR+, HER2+) - Signed by BCammie Sickle MD on 06/04/2022 Histologic grading system: 3 grade system   11/10/2021 - 03/01/2022 Chemotherapy   Patient is on Treatment Plan : BREAST  Docetaxel + Carboplatin + Trastuzumab + Pertuzumab  (TCHP) q21d      11/11/2021 - 01/13/2022 Chemotherapy   Patient is on Treatment Plan : BREAST  Docetaxel + Carboplatin + Trastuzumab + Pertuzumab  (TCHP) q21d       Genetic Testing   Negative genetic testing. No pathogenic variants identified on the [facility] panel. The report date is 11/29/2021.  The CancerNext-Expanded + RNAinsight gene panel offered by [employer] includes sequencing and rearrangement analysis for the following 77 genes: IP, ALK, APC*, ATM*, AXIN2, BAP1, BARD1, BLM, BMPR1A, BRCA1*, BRCA2*, BRIP1*, CDC73, CDH1*,CDK4, CDKN1B, CDKN2A, CHEK2*, CTNNA1, DICER1, FANCC, FH, FLCN, GALNT12, KIF1B, LZTR1, MAX, MEN1, MET, MLH1*, MSH2*, MSH3, MSH6*, MUTYH*, NBN, NF1*, NF2, NTHL1, PALB2*, PHOX2B, PMS2*, POT1, PRKAR1A, PTCH1, PTEN*, RAD51C*, RAD51D*,RB1, RECQL, RET, SDHA, SDHAF2, SDHB, SDHC, SDHD, SMAD4, SMARCA4, SMARCB1, SMARCE1, STK11, SUFU, TMEM127, TP53*,TSC1, TSC2, VHL and XRCC2 (sequencing and deletion/duplication);  EGFR, EGLN1, HOXB13, KIT, MITF, PDGFRA, POLD1 and POLE (sequencing only); EPCAM and GREM1 (deletion/duplication only).   07/09/2022 -  Chemotherapy   Patient is on Treatment Plan : BREAST ADO-Trastuzumab Emtansine (Kadcyla) q21d      HISTORY OF PRESENTING ILLNESS: Patient is ambulating independently.  Accompanied by her husband.   CClaudean Severance43y.o.  female right breast TRIPLE POSITIVE with T4- Stage III -s/p mastectomy-with expanders-currently on adjuvant kadcyla / tamoxifen + OFS  is here for follow-up.    Patient currently on postmastectomy radiation.  Tolerating well.  Patient had fever and rash after her last kadcyla infusion.  Resolved with Claritin; and NSAIDs.  Did take Claritin  and Tylenol last night.  Also took 2 Tylenol this morning.  Interested in taking 1 Benadryl in pre-med today due to 2 Benadryl cause excessive drowsiness.     Has not started back on oral Potassium.  Thinks this could be causing itch.  Slight improvement in neuropathy and fingernail health.   Patient currently start postmastectomy radiation on 2/07.  Chronic mild fatigue. No skin rash.    Review of Systems  Constitutional:  Positive for malaise/fatigue. Negative for chills, diaphoresis, fever and weight loss.  HENT:  Negative for nosebleeds and sore throat.   Eyes:  Negative for double vision.  Respiratory:  Negative for cough, hemoptysis, sputum production, shortness of breath and wheezing.   Cardiovascular:  Negative for chest pain, palpitations, orthopnea and leg swelling.  Gastrointestinal:  Negative for abdominal pain, blood in stool, constipation, diarrhea, heartburn, melena, nausea and vomiting.  Genitourinary:  Negative for dysuria, frequency and urgency.  Musculoskeletal:  Positive for myalgias. Negative for back pain and joint pain.  Skin: Negative.  Negative for itching and rash.  Neurological:  Negative for dizziness, tingling, focal weakness, weakness and headaches.  Endo/Heme/Allergies:   Does not bruise/bleed easily.  Psychiatric/Behavioral:  Negative for depression. The patient is not nervous/anxious and does not have insomnia.      MEDICAL HISTORY:  Past Medical History:  Diagnosis Date   Anemia    Carcinoma of upper-outer quadrant of right breast in female, estrogen receptor positive ([place]) 10/26/2021   Diabetes mellitus without complication (HCC)    Family history of adverse reaction to anesthesia    grandmother had issues but doesnt know what the issue was, she was older.   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 Axilla Butterfly hydro clip-MACRO METASTATIC MAMMARY CARCINOMA   BREAST BIOPSY Right 04/21/2022   [place] RT PLC BREAST LOC DEV   1ST LESION  INC US GUIDE 04/21/2022 [facility]   BREAST RECONSTRUCTION WITH PLACEMENT OF TISSUE EXPANDER AND FLEX HD (ACELLULAR HYDRATED DERMIS) Right 04/21/2022   Procedure: RIGHT BREAST RECONSTRUCTION WITH PLACEMENT OF TISSUE EXPANDER AND FLEX HD (ACELLULAR HYDRATED DERMIS);  Surgeon: Wallace Going, DO;  Location: [facility];  Service: Plastics;  Laterality: Right;   CESAREAN SECTION  2005/2008   DILATION AND CURETTAGE OF UTERUS  2003   PORTACATH PLACEMENT Left 11/09/2021   Procedure: INSERTION PORT-A-CATH;  Surgeon: Robert Bellow, MD;  Location: [facility];  Service: General;  Laterality: Left;   REMOVAL OF TISSUE EXPANDER AND PLACEMENT OF IMPLANT Right 06/08/2022   Procedure: REMOVAL OF TISSUE EXPANDER;  Surgeon: Wallace Going, DO;  Location: [facility];  Service: Plastics;  Laterality: Right;   SIMPLE MASTECTOMY WITH AXILLARY SENTINEL NODE BIOPSY Right 04/21/2022   Procedure: SIMPLE MASTECTOMY WITH AXILLARY SENTINEL NODE BIOPSY;  Surgeon: Robert Bellow, MD;  Location: [facility];  Service: General;  Laterality: Right;  wire localization of lymph node   SKIN BIOPSY Right 11/09/2021   Procedure: PUNCH BIOPSY SKIN, TATTOO LYMPH NODE RIGHT AXILLA;  Surgeon: Robert Bellow, MD;  Location: [facility];  Service: General;  Laterality: Right;   WISDOM TOOTH EXTRACTION      SOCIAL HISTORY: Social History   Socioeconomic History   Marital status: Married  Spouse name: Jaci Standard   Number of children: 2   Years of education: Not on file   Highest education level: Not on file  Occupational History   Occupation: and Optometrist  Tobacco Use   Smoking status: Never   Smokeless tobacco: Never  Vaping Use   Vaping Use: Never used  Substance and Sexual Activity   Alcohol use: Never   Drug use: Never   Sexual activity: Yes    Birth control/protection: Other-see comments, Surgical    Comment: vasectomy  Other Topics Concern   Not on file  Social History Narrative   Pre-school teacher; in [place]; 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    Social Determinants of Health   Financial Resource Strain: Not on file  Food Insecurity: Not on file  Transportation Needs: Not on file  Physical Activity: Inactive (09/26/2017)   Exercise Vital Sign    Days of Exercise per Week: 0 days    Minutes of Exercise per Session: 0 min  Stress: Not on file  Social Connections: Not on file  Intimate Partner Violence: Not on file    FAMILY HISTORY: Family History  Problem Relation Age of Onset   Ovarian cysts Mother    Migraines Mother    Melanoma Mother        on leg   Hyperlipidemia Father    Diabetes Maternal Aunt    Stomach cancer Paternal Aunt    Diabetes Maternal Grandfather    Lymphoma Cousin 17   Breast cancer Neg Hx     ALLERGIES:  is allergic to metformin and oxycodone.  MEDICATIONS:  Current Outpatient Medications  Medication Sig Dispense Refill   acetaminophen (TYLENOL) 325 MG tablet Take 2 tablets (650 mg total) by mouth every 6  (six) hours as needed for mild pain (or Fever >/= 101).     ascorbic acid (VITAMIN C) 500 MG tablet Take 1 tablet (500 mg total) by mouth daily. 30 tablet    calcium-vitamin D (OSCAL WITH D) 500-5 MG-MCG tablet Take 1 tablet by mouth 2 (two) times daily. 60 tablet 2   Cyanocobalamin (VITAMIN B-12 PO) Take 1 tablet by mouth daily.     ferrous sulfate 325 (65 FE) MG tablet Take 325 mg by mouth 2 (two) times daily with a meal.     lidocaine-prilocaine (EMLA) cream Apply on the port. 30 -45 min  prior to port access. 30 g 3   loratadine (CLARITIN) 10 MG tablet Take 10 mg by mouth as needed for allergies. Day before tx, day of and day after.     Multiple Vitamin (MULTIVITAMIN WITH MINERALS) TABS tablet Take 1 tablet by mouth daily.     mupirocin ointment (BACTROBAN) 2 % Apply 1 Application topically 2 (two) times daily. 30 g 0   ondansetron (ZOFRAN) 4 MG tablet Take 1 tablet (4 mg total) by mouth every 8 (eight) hours as needed for up to 20 doses for nausea or vomiting. 20 tablet 0   ONETOUCH VERIO test strip SMARTSIG:1 Each Via Meter Daily PRN     tamoxifen (NOLVADEX) 20 MG tablet TAKE 1 TABLET BY MOUTH EVERY DAY 90 tablet 1   Magnesium Cl-Calcium Carbonate (SLOW MAGNESIUM/CALCIUM) 70-117 MG TBEC Take 1 tablet by mouth 2 (two) times daily. (Patient not taking: Reported on 07/15/2022) 60 tablet 6   ondansetron (ZOFRAN-ODT) 8 MG disintegrating tablet Take 8 mg by mouth every 8 (eight) hours as needed for nausea or vomiting. (Patient not taking: Reported on 07/02/2022)     potassium  chloride (KLOR-CON) 20 MEQ packet Take 20 mEq by mouth 2 (two) times daily. (Patient not taking: Reported on 07/02/2022) 60 packet 3   prochlorperazine (COMPAZINE) 10 MG tablet Take 1 tablet (10 mg total) by mouth every 6 (six) hours as needed for nausea or vomiting. (Patient not taking: Reported on 07/02/2022) 40 tablet 1   No current facility-administered medications for this visit.   Facility-Administered Medications Ordered in  Other Visits  Medication Dose Route Frequency Provider Last Rate Last Admin   acetaminophen (TYLENOL) tablet 650 mg  650 mg Oral Once Cammie Sickle, MD          .  PHYSICAL EXAMINATION: ECOG PERFORMANCE STATUS: 0 - Asymptomatic  Vitals:   07/30/22 1100  BP: 115/79  Pulse: 71  Resp: 18  Temp: (!) 96.9 F (36.1 C)     Filed Weights   07/30/22 1100  Weight: 218 lb 1.6 oz (98.9 kg)    Physical Exam Vitals and nursing note reviewed.  HENT:     Head: Normocephalic and atraumatic.     Mouth/Throat:     Pharynx: Oropharynx is clear.  Eyes:     Extraocular Movements: Extraocular movements intact.     Pupils: Pupils are equal, round, and reactive to light.  Cardiovascular:     Rate and Rhythm: Normal rate and regular rhythm.  Pulmonary:     Comments: Decreased breath sounds bilaterally.  Abdominal:     Palpations: Abdomen is soft.  Musculoskeletal:        General: Normal range of motion.     Cervical back: Normal range of motion.  Skin:    General: Skin is warm.  Neurological:     General: No focal deficit present.     Mental Status: She is alert and oriented to person, place, and time.  Psychiatric:        Behavior: Behavior normal.        Judgment: Judgment normal.      LABORATORY DATA:  I have reviewed the data as listed Lab Results  Component Value Date   WBC 5.0 07/30/2022   HGB 10.6 (L) 07/30/2022   HCT 32.5 (L) 07/30/2022   MCV 84.6 07/30/2022   PLT 193 07/30/2022   Recent Labs    12/27/21 2105 12/28/21 0329 06/04/22 1436 07/02/22 1029 07/30/22 1051  NA 134*   < > 138 138 136  K 3.6   < > 3.8 3.4* 3.3*  CL 104   < > 103 103 101  CO2 22   < > 26 26 27  $ GLUCOSE 192*   < > 190* 202* 209*  BUN 16   < > 13 14 15  $ CREATININE 0.93   < > 0.91 0.67 0.71  CALCIUM 8.6*   < > 9.0 8.7* 9.2  GFRNONAA >60   < > >60 >60 >60  PROT 7.0   < > 7.3 6.9 6.4*  ALBUMIN 3.7   < > 3.3* 3.3* 2.9*  AST 42*   < > 15 23 19  $ ALT 51*   < > 14 17 15  $ ALKPHOS  70   < > 60 55 50  BILITOT 1.1   < > 0.4 0.5 0.5  BILIDIR 0.2  --   --   --   --   IBILI 0.9  --   --   --   --    < > = values in this interval not displayed.    RADIOGRAPHIC STUDIES: I have personally reviewed  the radiological images as listed and agreed with the findings in the report. NM Cardiac [facility] Rest  Result Date: 07/05/2022 CLINICAL DATA:  Breast cancer, cardiotoxic chemotherapy EXAM: NUCLEAR MEDICINE CARDIAC BLOOD POOL IMAGING (MUGA) TECHNIQUE: Cardiac multi-gated acquisition was performed at rest following intravenous injection of Tc-69mlabeled red blood cells. RADIOPHARMACEUTICALS:  22.01 mCi Tc-941mertechnetate in-vitro labeled red blood cells IV COMPARISON:  11/04/2021 FINDINGS: Calculated LEFT ventricular ejection fraction is 57.1%, normal, slightly decreased from the 62.1% on the prior exam. Study was obtained at a cardiac rate of 71 bpm. Patient was fairly rhythmic during imaging. Cine analysis of the LEFT ventricle in 3 projections demonstrates normal LEFT ventricular wall motion. IMPRESSION: Normal LEFT ventricular ejection fraction of 57.1% slightly decreased from the 62.1% on the prior study of 11/04/2021. No focal LEFT ventricular wall motion abnormalities. Electronically Signed   By: MaLavonia Dana.D.   On: 07/05/2022 12:14    ASSESSMENT & PLAN:   Carcinoma of upper-outer quadrant of right breast in female, estrogen receptor positive ([facility]# MAY 2023- STAGE III-T4N1 ER/PR positive HER2 POSITIVE right breast cancer; LN-positive-ER/PR positive her-2 NEGATIVE; # Currently s/p neoadjuvant chemotherapy- TCH plus P x 6 cycles- s/p Riht mastectomy-[15th, NOv 2023] ypT2 [219m ypN0.  Given partial response/involvement of the breast skin/multifocal disease-s/p evaluation with Dr. CryDonella StadeAwaiting to start radiation; planned sim on 1/29.  Patient currently on tamoxifen [DEC-JAN 2024]; + OFS- Lupron every 3 months ]. ON Ado-trastuzumab emtansine  for 14 cycles. RT until 3/22.   #  proceed with Ado-trastuzumab emtansine #2 of planned 14 cycles.  Patient tolerated treatment well except for-rash/fever see below     MUGA scan May 31st, 2023- -61%. JAN 2024- Normal LEFT ventricular ejection fraction of 57.1% .  # Allergic reaction to Ado-trastuzumab emtansine-fever/rash.  Improved with Claritin. patient currently on Claritin x3 days postchemotherapy; Tylenol preemptively.  Decrease Benadryl 25 mg given drowsiness.  # Discussed the role of Zometa every 6 months for 3 years/on adjuvant basis.   Continue ca+Vit D BID. Will plan at next visit; ordered today.   # Mastectomy site infection/cellulitis currently on-antibiotics as per plastic surgery. S/p evaluation with plastic surgery today.Stable.  # Hypocalcemia- Continue calcium 1200 plus vitamin D-3 1000-over-the-counter-1 pill a day.  Penidng- vit D 25-OH levels  # Hypokalemia: K dur 3.3- recommend compliance with at least once a day.   # PN G-1-2 sec to Taxotere- monitor for now. Stable.   # PV access: port functional  Lupron 07/09/2022 #1 q 56M; Zometa q 6 M  # DISPOSITION: # Kadcyla today.  # follow up in 3 week- MD;  port- labs; cbc/cmp; Kadcyla; zometa  Dr.B  All questions were answered. The patient/family knows to call the clinic with any problems, questions or concerns.     GovCammie SickleD 07/30/2022 1:16 PM

## 2022-08-02 ENCOUNTER — Ambulatory Visit: Payer: 59

## 2022-08-02 ENCOUNTER — Ambulatory Visit
Admission: RE | Admit: 2022-08-02 | Discharge: 2022-08-02 | Disposition: A | Discharge status: Home / Self Care | Payer: 59 | Source: Ambulatory Visit | Attending: Radiation Oncology | Admitting: Radiation Oncology

## 2022-08-02 ENCOUNTER — Other Ambulatory Visit: Payer: Self-pay

## 2022-08-02 DIAGNOSIS — Z5111 Encounter for antineoplastic chemotherapy: Secondary | ICD-10-CM | POA: Diagnosis not present

## 2022-08-02 DIAGNOSIS — C50411 Malignant neoplasm of upper-outer quadrant of right female breast: Secondary | ICD-10-CM | POA: Diagnosis not present

## 2022-08-02 DIAGNOSIS — Z510 Encounter for antineoplastic radiation therapy: Secondary | ICD-10-CM | POA: Diagnosis not present

## 2022-08-02 LAB — RAD ONC ARIA SESSION SUMMARY
Course Elapsed Days: 19
Plan Fractions Treated to Date: 14
Plan Prescribed Dose Per Fraction: 1.8 Gy
Plan Total Fractions Prescribed: 28
Plan Total Prescribed Dose: 50.4 Gy
Reference Point Dosage Given to Date: 25.2 Gy
Reference Point Session Dosage Given: 1.8 Gy
Session Number: 14

## 2022-08-03 ENCOUNTER — Ambulatory Visit
Admission: RE | Admit: 2022-08-03 | Discharge: 2022-08-03 | Disposition: A | Discharge status: Home / Self Care | Payer: 59 | Source: Ambulatory Visit | Attending: Radiation Oncology | Admitting: Radiation Oncology

## 2022-08-03 ENCOUNTER — Ambulatory Visit: Payer: 59

## 2022-08-03 ENCOUNTER — Other Ambulatory Visit: Payer: Self-pay

## 2022-08-03 DIAGNOSIS — Z5111 Encounter for antineoplastic chemotherapy: Secondary | ICD-10-CM | POA: Diagnosis not present

## 2022-08-03 DIAGNOSIS — Z510 Encounter for antineoplastic radiation therapy: Secondary | ICD-10-CM | POA: Diagnosis not present

## 2022-08-03 DIAGNOSIS — C50411 Malignant neoplasm of upper-outer quadrant of right female breast: Secondary | ICD-10-CM | POA: Diagnosis not present

## 2022-08-03 LAB — RAD ONC ARIA SESSION SUMMARY
Course Elapsed Days: 20
Plan Fractions Treated to Date: 15
Plan Prescribed Dose Per Fraction: 1.8 Gy
Plan Total Fractions Prescribed: 28
Plan Total Prescribed Dose: 50.4 Gy
Reference Point Dosage Given to Date: 27 Gy
Reference Point Session Dosage Given: 1.8 Gy
Session Number: 15

## 2022-08-04 ENCOUNTER — Ambulatory Visit: Payer: 59

## 2022-08-04 ENCOUNTER — Ambulatory Visit
Admission: RE | Admit: 2022-08-04 | Discharge: 2022-08-04 | Disposition: A | Discharge status: Home / Self Care | Payer: 59 | Source: Ambulatory Visit | Attending: Radiation Oncology | Admitting: Radiation Oncology

## 2022-08-04 ENCOUNTER — Other Ambulatory Visit: Payer: Self-pay

## 2022-08-04 DIAGNOSIS — Z5111 Encounter for antineoplastic chemotherapy: Secondary | ICD-10-CM | POA: Diagnosis not present

## 2022-08-04 DIAGNOSIS — Z510 Encounter for antineoplastic radiation therapy: Secondary | ICD-10-CM | POA: Diagnosis not present

## 2022-08-04 DIAGNOSIS — C50411 Malignant neoplasm of upper-outer quadrant of right female breast: Secondary | ICD-10-CM | POA: Diagnosis not present

## 2022-08-04 LAB — RAD ONC ARIA SESSION SUMMARY
Course Elapsed Days: 21
Plan Fractions Treated to Date: 16
Plan Prescribed Dose Per Fraction: 1.8 Gy
Plan Total Fractions Prescribed: 28
Plan Total Prescribed Dose: 50.4 Gy
Reference Point Dosage Given to Date: 28.8 Gy
Reference Point Session Dosage Given: 1.8 Gy
Session Number: 16

## 2022-08-05 ENCOUNTER — Ambulatory Visit: Payer: 59

## 2022-08-05 ENCOUNTER — Inpatient Hospital Stay: Payer: 59

## 2022-08-05 ENCOUNTER — Other Ambulatory Visit: Payer: Self-pay

## 2022-08-05 ENCOUNTER — Ambulatory Visit
Admission: RE | Admit: 2022-08-05 | Discharge: 2022-08-05 | Disposition: A | Discharge status: Home / Self Care | Payer: 59 | Source: Ambulatory Visit | Attending: Radiation Oncology | Admitting: Radiation Oncology

## 2022-08-05 DIAGNOSIS — C50411 Malignant neoplasm of upper-outer quadrant of right female breast: Secondary | ICD-10-CM | POA: Diagnosis not present

## 2022-08-05 DIAGNOSIS — Z510 Encounter for antineoplastic radiation therapy: Secondary | ICD-10-CM | POA: Diagnosis not present

## 2022-08-05 DIAGNOSIS — Z5111 Encounter for antineoplastic chemotherapy: Secondary | ICD-10-CM | POA: Diagnosis not present

## 2022-08-05 LAB — RAD ONC ARIA SESSION SUMMARY
Course Elapsed Days: 22
Plan Fractions Treated to Date: 17
Plan Prescribed Dose Per Fraction: 1.8 Gy
Plan Total Fractions Prescribed: 28
Plan Total Prescribed Dose: 50.4 Gy
Reference Point Dosage Given to Date: 30.6 Gy
Reference Point Session Dosage Given: 1.8 Gy
Session Number: 17

## 2022-08-06 ENCOUNTER — Ambulatory Visit: Payer: 59

## 2022-08-06 ENCOUNTER — Other Ambulatory Visit: Payer: Self-pay

## 2022-08-06 ENCOUNTER — Ambulatory Visit
Admission: RE | Admit: 2022-08-06 | Discharge: 2022-08-06 | Disposition: A | Discharge status: Home / Self Care | Payer: 59 | Source: Ambulatory Visit | Attending: Radiation Oncology | Admitting: Radiation Oncology

## 2022-08-06 DIAGNOSIS — C50411 Malignant neoplasm of upper-outer quadrant of right female breast: Secondary | ICD-10-CM | POA: Diagnosis present

## 2022-08-06 DIAGNOSIS — E876 Hypokalemia: Secondary | ICD-10-CM | POA: Insufficient documentation

## 2022-08-06 DIAGNOSIS — L598 Other specified disorders of the skin and subcutaneous tissue related to radiation: Secondary | ICD-10-CM | POA: Diagnosis not present

## 2022-08-06 DIAGNOSIS — Z510 Encounter for antineoplastic radiation therapy: Secondary | ICD-10-CM | POA: Diagnosis present

## 2022-08-06 DIAGNOSIS — Z170 Estrogen receptor positive status [ER+]: Secondary | ICD-10-CM | POA: Diagnosis not present

## 2022-08-06 DIAGNOSIS — M25611 Stiffness of right shoulder, not elsewhere classified: Secondary | ICD-10-CM | POA: Insufficient documentation

## 2022-08-06 DIAGNOSIS — Z5112 Encounter for antineoplastic immunotherapy: Secondary | ICD-10-CM | POA: Insufficient documentation

## 2022-08-06 LAB — RAD ONC ARIA SESSION SUMMARY
Course Elapsed Days: 23
Plan Fractions Treated to Date: 18
Plan Prescribed Dose Per Fraction: 1.8 Gy
Plan Total Fractions Prescribed: 28
Plan Total Prescribed Dose: 50.4 Gy
Reference Point Dosage Given to Date: 32.4 Gy
Reference Point Session Dosage Given: 1.8 Gy
Session Number: 18

## 2022-08-09 ENCOUNTER — Ambulatory Visit
Admission: RE | Admit: 2022-08-09 | Discharge: 2022-08-09 | Disposition: A | Discharge status: Home / Self Care | Payer: 59 | Source: Ambulatory Visit | Attending: Radiation Oncology | Admitting: Radiation Oncology

## 2022-08-09 ENCOUNTER — Other Ambulatory Visit: Payer: Self-pay

## 2022-08-09 ENCOUNTER — Ambulatory Visit: Payer: 59

## 2022-08-09 DIAGNOSIS — Z5112 Encounter for antineoplastic immunotherapy: Secondary | ICD-10-CM | POA: Diagnosis not present

## 2022-08-09 LAB — RAD ONC ARIA SESSION SUMMARY
Course Elapsed Days: 26
Plan Fractions Treated to Date: 19
Plan Prescribed Dose Per Fraction: 1.8 Gy
Plan Total Fractions Prescribed: 28
Plan Total Prescribed Dose: 50.4 Gy
Reference Point Dosage Given to Date: 34.2 Gy
Reference Point Session Dosage Given: 1.8 Gy
Session Number: 19

## 2022-08-10 ENCOUNTER — Ambulatory Visit
Admission: RE | Admit: 2022-08-10 | Discharge: 2022-08-10 | Disposition: A | Discharge status: Home / Self Care | Payer: 59 | Source: Ambulatory Visit | Attending: Radiation Oncology | Admitting: Radiation Oncology

## 2022-08-10 ENCOUNTER — Ambulatory Visit: Payer: 59

## 2022-08-10 ENCOUNTER — Other Ambulatory Visit: Payer: Self-pay

## 2022-08-10 DIAGNOSIS — Z5112 Encounter for antineoplastic immunotherapy: Secondary | ICD-10-CM | POA: Diagnosis not present

## 2022-08-10 LAB — RAD ONC ARIA SESSION SUMMARY
Course Elapsed Days: 27
Plan Fractions Treated to Date: 20
Plan Prescribed Dose Per Fraction: 1.8 Gy
Plan Total Fractions Prescribed: 28
Plan Total Prescribed Dose: 50.4 Gy
Reference Point Dosage Given to Date: 36 Gy
Reference Point Session Dosage Given: 1.8 Gy
Session Number: 20

## 2022-08-11 ENCOUNTER — Other Ambulatory Visit: Payer: Self-pay

## 2022-08-11 ENCOUNTER — Ambulatory Visit: Payer: 59

## 2022-08-11 ENCOUNTER — Ambulatory Visit
Admission: RE | Admit: 2022-08-11 | Discharge: 2022-08-11 | Disposition: A | Discharge status: Home / Self Care | Payer: 59 | Source: Ambulatory Visit | Attending: Radiation Oncology | Admitting: Radiation Oncology

## 2022-08-11 DIAGNOSIS — Z5112 Encounter for antineoplastic immunotherapy: Secondary | ICD-10-CM | POA: Diagnosis not present

## 2022-08-11 LAB — RAD ONC ARIA SESSION SUMMARY
Course Elapsed Days: 28
Plan Fractions Treated to Date: 21
Plan Prescribed Dose Per Fraction: 1.8 Gy
Plan Total Fractions Prescribed: 28
Plan Total Prescribed Dose: 50.4 Gy
Reference Point Dosage Given to Date: 37.8 Gy
Reference Point Session Dosage Given: 1.8 Gy
Session Number: 21

## 2022-08-12 ENCOUNTER — Ambulatory Visit
Admission: RE | Admit: 2022-08-12 | Discharge: 2022-08-12 | Disposition: A | Discharge status: Home / Self Care | Payer: 59 | Source: Ambulatory Visit | Attending: Radiation Oncology | Admitting: Radiation Oncology

## 2022-08-12 ENCOUNTER — Other Ambulatory Visit: Payer: 59

## 2022-08-12 ENCOUNTER — Other Ambulatory Visit: Payer: Self-pay

## 2022-08-12 ENCOUNTER — Ambulatory Visit: Payer: 59

## 2022-08-12 DIAGNOSIS — Z5112 Encounter for antineoplastic immunotherapy: Secondary | ICD-10-CM | POA: Diagnosis not present

## 2022-08-12 LAB — RAD ONC ARIA SESSION SUMMARY
Course Elapsed Days: 29
Plan Fractions Treated to Date: 22
Plan Prescribed Dose Per Fraction: 1.8 Gy
Plan Total Fractions Prescribed: 28
Plan Total Prescribed Dose: 50.4 Gy
Reference Point Dosage Given to Date: 39.6 Gy
Reference Point Session Dosage Given: 1.8 Gy
Session Number: 22

## 2022-08-13 ENCOUNTER — Ambulatory Visit: Payer: 59

## 2022-08-13 ENCOUNTER — Ambulatory Visit
Admission: RE | Admit: 2022-08-13 | Discharge: 2022-08-13 | Disposition: A | Discharge status: Home / Self Care | Payer: 59 | Source: Ambulatory Visit | Attending: Radiation Oncology | Admitting: Radiation Oncology

## 2022-08-13 ENCOUNTER — Other Ambulatory Visit: Payer: Self-pay

## 2022-08-13 DIAGNOSIS — Z5112 Encounter for antineoplastic immunotherapy: Secondary | ICD-10-CM | POA: Diagnosis not present

## 2022-08-13 LAB — RAD ONC ARIA SESSION SUMMARY
Course Elapsed Days: 30
Plan Fractions Treated to Date: 23
Plan Prescribed Dose Per Fraction: 1.8 Gy
Plan Total Fractions Prescribed: 28
Plan Total Prescribed Dose: 50.4 Gy
Reference Point Dosage Given to Date: 41.4 Gy
Reference Point Session Dosage Given: 1.8 Gy
Session Number: 23

## 2022-08-16 ENCOUNTER — Ambulatory Visit
Admission: RE | Admit: 2022-08-16 | Discharge: 2022-08-16 | Disposition: A | Discharge status: Home / Self Care | Payer: 59 | Source: Ambulatory Visit | Attending: Radiation Oncology | Admitting: Radiation Oncology

## 2022-08-16 ENCOUNTER — Ambulatory Visit: Payer: 59

## 2022-08-16 ENCOUNTER — Other Ambulatory Visit: Payer: Self-pay

## 2022-08-16 DIAGNOSIS — Z5112 Encounter for antineoplastic immunotherapy: Secondary | ICD-10-CM | POA: Diagnosis not present

## 2022-08-16 LAB — RAD ONC ARIA SESSION SUMMARY
Course Elapsed Days: 33
Plan Fractions Treated to Date: 24
Plan Prescribed Dose Per Fraction: 1.8 Gy
Plan Total Fractions Prescribed: 28
Plan Total Prescribed Dose: 50.4 Gy
Reference Point Dosage Given to Date: 43.2 Gy
Reference Point Session Dosage Given: 1.8 Gy
Session Number: 24

## 2022-08-17 ENCOUNTER — Ambulatory Visit
Admission: RE | Admit: 2022-08-17 | Discharge: 2022-08-17 | Disposition: A | Discharge status: Home / Self Care | Payer: 59 | Source: Ambulatory Visit | Attending: Radiation Oncology | Admitting: Radiation Oncology

## 2022-08-17 ENCOUNTER — Other Ambulatory Visit: Payer: Self-pay

## 2022-08-17 ENCOUNTER — Ambulatory Visit: Payer: 59

## 2022-08-17 DIAGNOSIS — Z5112 Encounter for antineoplastic immunotherapy: Secondary | ICD-10-CM | POA: Diagnosis not present

## 2022-08-17 LAB — RAD ONC ARIA SESSION SUMMARY
Course Elapsed Days: 34
Plan Fractions Treated to Date: 25
Plan Prescribed Dose Per Fraction: 1.8 Gy
Plan Total Fractions Prescribed: 28
Plan Total Prescribed Dose: 50.4 Gy
Reference Point Dosage Given to Date: 45 Gy
Reference Point Session Dosage Given: 1.8 Gy
Session Number: 25

## 2022-08-18 ENCOUNTER — Other Ambulatory Visit: Payer: Self-pay

## 2022-08-18 ENCOUNTER — Ambulatory Visit
Admission: RE | Admit: 2022-08-18 | Discharge: 2022-08-18 | Disposition: A | Discharge status: Home / Self Care | Payer: 59 | Source: Ambulatory Visit | Attending: Radiation Oncology | Admitting: Radiation Oncology

## 2022-08-18 ENCOUNTER — Ambulatory Visit: Payer: 59

## 2022-08-18 DIAGNOSIS — Z5112 Encounter for antineoplastic immunotherapy: Secondary | ICD-10-CM | POA: Diagnosis not present

## 2022-08-18 LAB — RAD ONC ARIA SESSION SUMMARY
Course Elapsed Days: 35
Plan Fractions Treated to Date: 26
Plan Prescribed Dose Per Fraction: 1.8 Gy
Plan Total Fractions Prescribed: 28
Plan Total Prescribed Dose: 50.4 Gy
Reference Point Dosage Given to Date: 46.8 Gy
Reference Point Session Dosage Given: 1.8 Gy
Session Number: 26

## 2022-08-19 ENCOUNTER — Ambulatory Visit
Admission: RE | Admit: 2022-08-19 | Discharge: 2022-08-19 | Disposition: A | Discharge status: Home / Self Care | Payer: 59 | Source: Ambulatory Visit | Attending: Radiation Oncology | Admitting: Radiation Oncology

## 2022-08-19 ENCOUNTER — Other Ambulatory Visit: Payer: Self-pay

## 2022-08-19 ENCOUNTER — Inpatient Hospital Stay: Payer: 59

## 2022-08-19 DIAGNOSIS — Z5112 Encounter for antineoplastic immunotherapy: Secondary | ICD-10-CM | POA: Diagnosis not present

## 2022-08-19 LAB — RAD ONC ARIA SESSION SUMMARY
Course Elapsed Days: 36
Plan Fractions Treated to Date: 27
Plan Prescribed Dose Per Fraction: 1.8 Gy
Plan Total Fractions Prescribed: 28
Plan Total Prescribed Dose: 50.4 Gy
Reference Point Dosage Given to Date: 48.6 Gy
Reference Point Session Dosage Given: 1.8 Gy
Session Number: 27

## 2022-08-20 ENCOUNTER — Ambulatory Visit
Admission: RE | Admit: 2022-08-20 | Discharge: 2022-08-20 | Disposition: A | Discharge status: Home / Self Care | Payer: 59 | Source: Ambulatory Visit | Attending: Radiation Oncology | Admitting: Radiation Oncology

## 2022-08-20 ENCOUNTER — Inpatient Hospital Stay: Payer: 59

## 2022-08-20 ENCOUNTER — Inpatient Hospital Stay (HOSPITAL_BASED_OUTPATIENT_CLINIC_OR_DEPARTMENT_OTHER): Payer: 59 | Admitting: Internal Medicine

## 2022-08-20 ENCOUNTER — Encounter: Payer: Self-pay | Admitting: Internal Medicine

## 2022-08-20 ENCOUNTER — Other Ambulatory Visit: Payer: Self-pay | Admitting: *Deleted

## 2022-08-20 ENCOUNTER — Other Ambulatory Visit: Payer: Self-pay

## 2022-08-20 VITALS — BP 108/54 | HR 56 | Resp 16

## 2022-08-20 VITALS — BP 113/50 | HR 70 | Temp 98.2°F | Resp 17 | Wt 219.2 lb

## 2022-08-20 DIAGNOSIS — Z5112 Encounter for antineoplastic immunotherapy: Secondary | ICD-10-CM | POA: Diagnosis not present

## 2022-08-20 DIAGNOSIS — C50411 Malignant neoplasm of upper-outer quadrant of right female breast: Secondary | ICD-10-CM | POA: Diagnosis not present

## 2022-08-20 DIAGNOSIS — Z170 Estrogen receptor positive status [ER+]: Secondary | ICD-10-CM | POA: Diagnosis not present

## 2022-08-20 LAB — RAD ONC ARIA SESSION SUMMARY
Course Elapsed Days: 37
Plan Fractions Treated to Date: 28
Plan Prescribed Dose Per Fraction: 1.8 Gy
Plan Total Fractions Prescribed: 28
Plan Total Prescribed Dose: 50.4 Gy
Reference Point Dosage Given to Date: 50.4 Gy
Reference Point Session Dosage Given: 1.8 Gy
Session Number: 28

## 2022-08-20 LAB — CBC WITH DIFFERENTIAL/PLATELET
Abs Immature Granulocytes: 0.01 10*3/uL (ref 0.00–0.07)
Basophils Absolute: 0 10*3/uL (ref 0.0–0.1)
Basophils Relative: 1 %
Eosinophils Absolute: 0.1 10*3/uL (ref 0.0–0.5)
Eosinophils Relative: 3 %
HCT: 31.9 % — ABNORMAL LOW (ref 36.0–46.0)
Hemoglobin: 10.5 g/dL — ABNORMAL LOW (ref 12.0–15.0)
Immature Granulocytes: 0 %
Lymphocytes Relative: 26 %
Lymphs Abs: 0.9 10*3/uL (ref 0.7–4.0)
MCH: 27.8 pg (ref 26.0–34.0)
MCHC: 32.9 g/dL (ref 30.0–36.0)
MCV: 84.4 fL (ref 80.0–100.0)
Monocytes Absolute: 0.2 10*3/uL (ref 0.1–1.0)
Monocytes Relative: 7 %
Neutro Abs: 2.1 10*3/uL (ref 1.7–7.7)
Neutrophils Relative %: 63 %
Platelets: 157 10*3/uL (ref 150–400)
RBC: 3.78 MIL/uL — ABNORMAL LOW (ref 3.87–5.11)
RDW: 14.6 % (ref 11.5–15.5)
WBC: 3.4 10*3/uL — ABNORMAL LOW (ref 4.0–10.5)
nRBC: 0 % (ref 0.0–0.2)

## 2022-08-20 LAB — COMPREHENSIVE METABOLIC PANEL
ALT: 20 U/L (ref 0–44)
AST: 28 U/L (ref 15–41)
Albumin: 3 g/dL — ABNORMAL LOW (ref 3.5–5.0)
Alkaline Phosphatase: 47 U/L (ref 38–126)
Anion gap: 8 (ref 5–15)
BUN: 12 mg/dL (ref 6–20)
CO2: 27 mmol/L (ref 22–32)
Calcium: 8.6 mg/dL — ABNORMAL LOW (ref 8.9–10.3)
Chloride: 103 mmol/L (ref 98–111)
Creatinine, Ser: 0.76 mg/dL (ref 0.44–1.00)
GFR, Estimated: >60 mL/min (ref >60)
Glucose, Bld: 228 mg/dL — ABNORMAL HIGH (ref 70–99)
Potassium: 3.2 mmol/L — ABNORMAL LOW (ref 3.5–5.1)
Sodium: 138 mmol/L (ref 135–145)
Total Bilirubin: 0.6 mg/dL (ref 0.3–1.2)
Total Protein: 6.6 g/dL (ref 6.5–8.1)

## 2022-08-20 MED ORDER — SODIUM CHLORIDE 0.9 % IV SOLN
Freq: Once | INTRAVENOUS | Status: AC
  Filled 2022-08-20: qty 250

## 2022-08-20 MED ORDER — DIPHENHYDRAMINE HCL 25 MG PO CAPS
25.0000 mg | ORAL_CAPSULE | Freq: Once | ORAL | Status: AC
  Administered 2022-08-20: 25 mg via ORAL
  Filled 2022-08-20: qty 1

## 2022-08-20 MED ORDER — SODIUM CHLORIDE 0.9 % IV SOLN
3.6000 mg/kg | Freq: Once | INTRAVENOUS | Status: AC
  Administered 2022-08-20: 360 mg via INTRAVENOUS
  Filled 2022-08-20: qty 8

## 2022-08-20 MED ORDER — ACETAMINOPHEN 325 MG PO TABS
650.0000 mg | ORAL_TABLET | Freq: Once | ORAL | Status: AC
  Administered 2022-08-20: 650 mg via ORAL
  Filled 2022-08-20: qty 2

## 2022-08-20 MED ORDER — SODIUM CHLORIDE 0.9% FLUSH
10.0000 mL | INTRAVENOUS | Status: DC | PRN
  Administered 2022-08-20: 10 mL
  Filled 2022-08-20: qty 10

## 2022-08-20 MED ORDER — HEPARIN SOD (PORK) LOCK FLUSH 100 UNIT/ML IV SOLN
500.0000 [IU] | Freq: Once | INTRAVENOUS | Status: AC | PRN
  Administered 2022-08-20: 500 [IU]
  Filled 2022-08-20: qty 5

## 2022-08-20 MED ORDER — PROCHLORPERAZINE MALEATE 10 MG PO TABS
10.0000 mg | ORAL_TABLET | Freq: Once | ORAL | Status: AC
  Administered 2022-08-20: 10 mg via ORAL
  Filled 2022-08-20: qty 1

## 2022-08-20 NOTE — Assessment & Plan Note (Addendum)
#   MAY 2023- STAGE III-T4N1 ER/PR positive HER2 POSITIVE right breast cancer; LN-positive-ER/PR positive her-2 NEGATIVE; # Currently s/p neoadjuvant chemotherapy- TCH plus P x 6 cycles- s/p Riht mastectomy-[15th, NOv 2023] ypT2 [57mm]; ypN0.  Given partial response/involvement of the breast skin/multifocal disease-s/p evaluation with Dr. Donella Stade.  Awaiting to start radiation; planned sim on 1/29.  Patient currently on tamoxifen [DEC-JAN 2024]; + OFS- Lupron every 3 months ]. ON Ado-trastuzumab emtansine  for 14 cycles. RT until 3/22.   # proceed with Ado-trastuzumab emtansine #3 of planned 14 cycles.  Patient tolerated treatment well except for-rash/fever see below     MUGA scan May 31st, 2023- -61%. JAN 2024- Normal LEFT ventricular ejection fraction of 57.1% .  # Allergic reaction to Ado-trastuzumab emtansine-fever/rash.  Improved with Claritin. patient currently on Claritin x3 days postchemotherapy; Tylenol preemptively.  Decrease Benadryl 25 mg given drowsiness.  # Hypocalcemia- Continue calcium 1200 plus vitamin D-3 1000-over-the-counter-1 pill a day.  Discussed the role of Zometa every 6 months for 3 years/on adjuvant basis.   Continue ca+Vit D BID. Vit D FEB 2024- 71; HOLD zometa today- slightly low ca.   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   # Risk of lymphedema- refer to [facility] again.   # Hypokalemia: K dur 3.2- recommend compliance with BID.   # PN G-1-2 sec to Taxotere- monitor for now. Stable.   # PV access: port functional  Lupron 07/09/2022 #1 q 50M; Zometa q 6 M  # DISPOSITION: # Kadcyla today; HOLD Zometa # follow up in 3 week- MD;  port- labs; cbc/cmp; Kadcyla- zometa  Dr.B

## 2022-08-20 NOTE — Progress Notes (Signed)
Having irritation from radiation to right axilla area. Doing well with it. Appetite is good. Bowels doing well. No hot flashes. Mild fatigue.

## 2022-08-20 NOTE — Patient Instructions (Signed)
[facility]  Discharge Instructions: Thank you for choosing [facility] to provide your oncology and hematology care.  If you have a lab appointment with the Cancer Center, please go directly to the Cancer Center and check in at the registration area.  Wear comfortable clothing and clothing appropriate for easy access to any Portacath or PICC line.   We strive to give you quality time with your provider. You may need to reschedule your appointment if you arrive late (15 or more minutes).  Arriving late affects you and other patients whose appointments are after yours.  Also, if you miss three or more appointments without notifying the office, you may be dismissed from the clinic at the provider's discretion.      For prescription refill requests, have your pharmacy contact our office and allow 72 hours for refills to be completed.    Today you received the following chemotherapy and/or immunotherapy agents Kadylca.      To help prevent nausea and vomiting after your treatment, we encourage you to take your nausea medication as directed.  BELOW ARE SYMPTOMS THAT SHOULD BE REPORTED IMMEDIATELY: *FEVER GREATER THAN 100.4 F (38 C) OR HIGHER *CHILLS OR SWEATING *NAUSEA AND VOMITING THAT IS NOT CONTROLLED WITH YOUR NAUSEA MEDICATION *UNUSUAL SHORTNESS OF BREATH *UNUSUAL BRUISING OR BLEEDING *URINARY PROBLEMS (pain or burning when urinating, or frequent urination) *BOWEL PROBLEMS (unusual diarrhea, constipation, pain near the anus) TENDERNESS IN MOUTH AND THROAT WITH OR WITHOUT PRESENCE OF ULCERS (sore throat, sores in mouth, or a toothache) UNUSUAL RASH, SWELLING OR PAIN  UNUSUAL VAGINAL DISCHARGE OR ITCHING   Items with * indicate a potential emergency and should be followed up as soon as possible or go to the Emergency Department if any problems should occur.  Please show the CHEMOTHERAPY ALERT CARD or IMMUNOTHERAPY ALERT CARD at check-in to  the Emergency Department and triage nurse.  Should you have questions after your visit or need to cancel or reschedule your appointment, please contact [facility]  [phone] and follow the prompts.  Office hours are 8:00 a.m. to 4:30 p.m. Monday - Friday. Please note that voicemails left after 4:00 p.m. may not be returned until the following business day.  We are closed weekends and major holidays. You have access to a nurse at all times for urgent questions. Please call the main number to the clinic [phone] and follow the prompts.  For any non-urgent questions, you may also contact your provider using MyChart. We now offer e-Visits for anyone 18 and older to request care online for non-urgent symptoms. For details visit [email or web address].   Also download the MyChart app! Go to the app store, search "MyChart", open the app, select [facility] [facility], and log in with your MyChart username and password.    

## 2022-08-20 NOTE — Progress Notes (Signed)
Pt tolerated treatment without complaints.  VSS.  Pt refused 30 minute post observation.  

## 2022-08-20 NOTE — Progress Notes (Signed)
[name] NOTE  Patient Care Team: Latanya Maudlin, NP as PCP - General (Family Medicine) Daiva Huge, RN as Oncology Nurse Navigator Cammie Sickle, [name]  CHIEF COMPLAINTS/PURPOSE OF CONSULTATION: Breast cancer  Oncology History Overview Note  MAY 2023-    Targeted ultrasound is performed, showing a 2.9 x 1.9 x 2.0 cm irregular hypoechoic mass right breast 10 o'clock position 6 cm from nipple at the site of palpable concern.   There is an abnormal 8 mm thickened right axillary lymph node.   There is skin thickening overlying the right breast. ------------------  A. BREAST, RIGHT AT 10:00, 6 CM FROM THE NIPPLE; ULTRASOUND-GUIDED CORE  NEEDLE BIOPSY:  - INVASIVE MAMMARY CARCINOMA, NO SPECIAL TYPE.   Size of invasive carcinoma: 11 mm in this sample  Histologic grade of invasive carcinoma: Grade 2                       Glandular/tubular differentiation score: 3                       Nuclear pleomorphism score: 2                       Mitotic rate score: 1                       Total score: 6  Ductal carcinoma in situ: Present, intermediate grade  Lymphovascular invasion: Not identified   B. LYMPH NODE, RIGHT AXILLARY; ULTRASOUND-GUIDED CORE NEEDLE BIOPSY:  - 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 IN GREATEST  EXTENT.   Comment:  The malignancy in the primary breast lesion appears morphologically  different from tumor in the lymph node sampling, with tumor present in  lymph node more suggestive of a histologic grade 3, with significantly  increased mitotic activity and pleomorphism. Due to the discrepancy in  these morphologic patterns, ER/PR/HER2 immunohistochemistry will be  performed on both blocks A1 and B1, with reflex to [place] for HER2 2+. The  results will be reported in an addendum.   ADDENDUM:  CASE SUMMARY: BREAST BIOMARKER TESTS - A - BREAST, RIGHT AT 10:00  Estrogen Receptor  (ER) Status: POSITIVE          Percentage of cells with nuclear positivity: 71-80%          Average intensity of staining: Strong   Progesterone Receptor (PgR) Status: POSITIVE          Percentage of cells with nuclear positivity: 81-90%          Average intensity of staining: Strong   HER2 (by immunohistochemistry): POSITIVE (Score 3+)       Percentage of cells with uniform intense complete membrane  staining: 61-70%  HER2 displays a heterogeneous staining pattern, with areas showing a  strong 3+ staining pattern, and other regions with complete absence (0+)  of staining.   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  Discussed with Dr. Sedalia Muta tumor heterogeneity. BREAST MRI- JUNE 2023- The patient's known malignancy in the upper outer quadrant of the right breast measures 3.8 x 2.8 x 3.6 cm. Numerous other suspicious masses are identified in the right breast as above involving the upper outer and upper inner quadrants, located both anterior and posterior to the known malignancy.  Numerous enhancing foci in the skin as above are worrisome for skin metastases ; Bx proven. Biopsy proven metastatic node in the right axilla.  No evidence of malignancy in the left breast;  Two mildly prominent right internal mammary nodes were not FDG avid on recent PET-CT imaging.  Currently on neoadjuvant chemotherapy- TCH  plus P x6 cycles;  MUGA scan May 31st, 2023- -61%.    # June 7th, 2023- NEO-ADJUVANT CHEMO- TCH+P x6 cycles- s/p Mastectomy [NOV 15th, 2023] [Dr.Byrnett]-  ypT2 [69mm]; ypN0.  # JAN mid, 2024 -TAMOXFEN 20 mg a day; ADD LupronFEB 2nd.2 2024  # FEB 2nd, 2023- Kadcyla q 3 W x14 cycles;   # Lupron q3 M [/start 2/07-2022]   # Genetic counseling s/p- NEG for any deleterious mutations.     Carcinoma of upper-outer quadrant of right breast in female, estrogen receptor positive ([place])  10/26/2021 Initial Diagnosis   Carcinoma of upper-outer quadrant of right breast in female, estrogen receptor positive ([place])   10/26/2021 Cancer Staging   Staging form: Breast, AJCC 8th Edition - Clinical: Stage IIIA (cT4d, cN1, cM0, G2, ER+, PR+, HER2+) - Signed by Cammie Sickle, MD on 06/04/2022 Histologic grading system: 3 grade system   11/10/2021 - 03/01/2022 Chemotherapy   Patient is on Treatment Plan : BREAST  Docetaxel + Carboplatin + Trastuzumab + Pertuzumab  (TCHP) q21d      11/11/2021 - 01/13/2022 Chemotherapy   Patient is on Treatment Plan : BREAST  Docetaxel + Carboplatin + Trastuzumab + Pertuzumab  (TCHP) q21d       Genetic Testing   Negative genetic testing. No pathogenic variants identified on the [facility] CancerNext-Expanded+RNA panel. The report date is 11/29/2021.  The CancerNext-Expanded + RNAinsight gene panel offered by [employer] and includes sequencing and rearrangement analysis for the following 77 genes: IP, ALK, APC*, ATM*, AXIN2, BAP1, BARD1, BLM, BMPR1A, BRCA1*, BRCA2*, BRIP1*, CDC73, CDH1*,CDK4, CDKN1B, CDKN2A, CHEK2*, CTNNA1, DICER1, FANCC, FH, FLCN, GALNT12, KIF1B, LZTR1, MAX, MEN1, MET, MLH1*, MSH2*, MSH3, MSH6*, MUTYH*, NBN, NF1*, NF2, NTHL1, PALB2*, PHOX2B, PMS2*, POT1, PRKAR1A, PTCH1, PTEN*, RAD51C*, RAD51D*,RB1, RECQL, RET, SDHA, SDHAF2, SDHB, SDHC, SDHD, SMAD4, SMARCA4, SMARCB1, SMARCE1, STK11, SUFU, TMEM127, TP53*,TSC1, TSC2, VHL and XRCC2 (sequencing and deletion/duplication);  EGFR, EGLN1, HOXB13, KIT, MITF, PDGFRA, POLD1 and POLE (sequencing only); EPCAM and GREM1 (deletion/duplication only).   07/09/2022 -  Chemotherapy   Patient is on Treatment Plan : BREAST ADO-Trastuzumab Emtansine (Kadcyla) q21d      HISTORY OF PRESENTING ILLNESS: Patient is ambulating independently.  Accompanied by her husband.   Marilyn Page 47 y.o.  female right breast TRIPLE POSITIVE with T4- Stage III -s/p mastectomy-with expanders-currently on adjuvant kadcyla / tamoxifen + OFS  is here for follow-up.    Having irritation from radiation to right axilla area. Doing well with it. Appetite is good. Bowels doing well. No hot flashes. Mild fatigue.  Patient currently on postmastectomy radiation  x1 more week.  Tolerating well.  Patient had no further fever and rash after her last kadcyla infusion.  Continues to take Claritin; and NSAIDs.   Patient oral Potassium.  Thinks this could be causing itch.  Slight improvement in neuropathy and fingernail health.   Review of Systems  Constitutional:  Positive for malaise/fatigue. Negative for chills, diaphoresis, fever and weight loss.  HENT:  Negative for nosebleeds and sore throat.   Eyes:  Negative for double vision.  Respiratory:  Negative for cough, hemoptysis, sputum production, shortness of breath and wheezing.   Cardiovascular:  Negative for chest pain, palpitations, orthopnea and leg swelling.  Gastrointestinal:  Negative for abdominal pain, blood in stool, constipation, diarrhea, heartburn, melena, nausea and vomiting.  Genitourinary:  Negative for dysuria, frequency and urgency.  Musculoskeletal:  Positive for myalgias. Negative for back pain and joint pain.  Skin: Negative.  Negative for itching and rash.  Neurological:  Negative for dizziness, tingling, focal weakness, weakness and headaches.  Endo/Heme/Allergies:  Does not bruise/bleed easily.  Psychiatric/Behavioral:  Negative for depression. The patient is not nervous/anxious and  does not have insomnia.      MEDICAL HISTORY:  Past Medical History:  Diagnosis Date   Anemia    Carcinoma of upper-outer quadrant of right breast in female, estrogen receptor positive ([place]) 10/26/2021   Diabetes mellitus without complication (HCC)    Family history of adverse reaction to anesthesia    grandmother had issues but doesnt know what the issue was, she was older.   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 Axilla Butterfly hydro clip-MACRO METASTATIC MAMMARY CARCINOMA   BREAST BIOPSY Right 04/21/2022   [place] RT PLC BREAST LOC DEV   1ST LESION  INC US GUIDE 04/21/2022 [facility]   BREAST RECONSTRUCTION WITH PLACEMENT OF TISSUE EXPANDER AND FLEX HD (ACELLULAR HYDRATED DERMIS) Right 04/21/2022   Procedure: RIGHT BREAST RECONSTRUCTION WITH PLACEMENT OF TISSUE EXPANDER AND FLEX HD (ACELLULAR HYDRATED DERMIS);  Surgeon: Wallace Going, DO;  Location: [facility];  Service: Plastics;  Laterality: Right;   CESAREAN SECTION  2005/2008   DILATION AND CURETTAGE OF UTERUS  2003   PORTACATH PLACEMENT Left 11/09/2021   Procedure: INSERTION PORT-A-CATH;  Surgeon: Robert Bellow, MD;  Location: [facility];  Service: General;  Laterality: Left;   REMOVAL OF TISSUE EXPANDER AND PLACEMENT OF IMPLANT Right 06/08/2022   Procedure: REMOVAL OF TISSUE EXPANDER;  Surgeon: Wallace Going, DO;  Location: [facility];  Service: Plastics;  Laterality: Right;   SIMPLE MASTECTOMY WITH AXILLARY SENTINEL NODE BIOPSY Right 04/21/2022   Procedure: SIMPLE MASTECTOMY WITH AXILLARY SENTINEL NODE BIOPSY;  Surgeon: Robert Bellow, MD;  Location: [facility];  Service: General;  Laterality: Right;  wire localization of lymph node   SKIN  BIOPSY Right 11/09/2021   Procedure: PUNCH BIOPSY SKIN, TATTOO LYMPH NODE RIGHT AXILLA;  Surgeon: Robert Bellow, MD;  Location: [facility];  Service: General;  Laterality: Right;   WISDOM TOOTH EXTRACTION      SOCIAL HISTORY: Social History   Socioeconomic History   Marital status: Married    Spouse name: Jaci Standard   Number of children: 2   Years of education: Not on file   Highest education level:  Not on file  Occupational History   Occupation: and Optometrist  Tobacco Use   Smoking status: Never   Smokeless tobacco: Never  Vaping Use   Vaping Use: Never used  Substance and Sexual Activity   Alcohol use: Never   Drug use: Never   Sexual activity: Yes    Birth control/protection: Other-see comments, Surgical    Comment: vasectomy  Other Topics Concern   Not on file  Social History Narrative   Pre-school teacher; in [place]; 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    Social Determinants of Health   Financial Resource Strain: Not on file  Food Insecurity: Not on file  Transportation Needs: Not on file  Physical Activity: Inactive (09/26/2017)   Exercise Vital Sign    Days of Exercise per Week: 0 days    Minutes of Exercise per Session: 0 min  Stress: Not on file  Social Connections: Not on file  Intimate Partner Violence: Not on file    FAMILY HISTORY: Family History  Problem Relation Age of Onset   Ovarian cysts Mother    Migraines Mother    Melanoma Mother        on leg   Hyperlipidemia Father    Diabetes Maternal Aunt    Stomach cancer Paternal Aunt    Diabetes Maternal Grandfather    Lymphoma Cousin 17   Breast cancer Neg Hx     ALLERGIES:  is allergic to metformin and oxycodone.  MEDICATIONS:  Current Outpatient Medications  Medication Sig Dispense Refill   acetaminophen (TYLENOL) 325 MG tablet Take 2 tablets (650 mg total) by mouth every 6 (six) hours as needed for mild pain (or Fever >/= 101).     ascorbic acid (VITAMIN C) 500 MG tablet Take 1 tablet (500  mg total) by mouth daily. 30 tablet    calcium-vitamin D (OSCAL WITH D) 500-5 MG-MCG tablet Take 1 tablet by mouth 2 (two) times daily. 60 tablet 2   Cyanocobalamin (VITAMIN B-12 PO) Take 1 tablet by mouth daily.     ferrous sulfate 325 (65 FE) MG tablet Take 325 mg by mouth 2 (two) times daily with a meal.     lidocaine-prilocaine (EMLA) cream Apply on the port. 30 -45 min  prior to port access. 30 g 3   loratadine (CLARITIN) 10 MG tablet Take 10 mg by mouth as needed for allergies. Day before tx, day of and day after.     Multiple Vitamin (MULTIVITAMIN WITH MINERALS) TABS tablet Take 1 tablet by mouth daily.     mupirocin ointment (BACTROBAN) 2 % Apply 1 Application topically 2 (two) times daily. 30 g 0   ONETOUCH VERIO test strip SMARTSIG:1 Each Via Meter Daily PRN     potassium chloride (KLOR-CON) 20 MEQ packet Take 20 mEq by mouth 2 (two) times daily. 60 packet 3   tamoxifen (NOLVADEX) 20 MG tablet TAKE 1 TABLET BY MOUTH EVERY DAY 90 tablet 1   Magnesium Cl-Calcium Carbonate (SLOW MAGNESIUM/CALCIUM) 70-117 MG TBEC Take 1 tablet by mouth 2 (two) times daily. (Patient not taking: Reported on 07/15/2022) 60 tablet 6   ondansetron (ZOFRAN) 4 MG tablet Take 1 tablet (4 mg total) by mouth every 8 (eight) hours as needed for up to 20 doses for nausea or vomiting. (Patient not taking: Reported on 08/20/2022) 20 tablet 0   ondansetron (ZOFRAN-ODT) 8 MG disintegrating tablet Take 8 mg by mouth every 8 (eight) hours as needed for nausea or vomiting. (Patient not taking: Reported on 07/02/2022)  prochlorperazine (COMPAZINE) 10 MG tablet Take 1 tablet (10 mg total) by mouth every 6 (six) hours as needed for nausea or vomiting. (Patient not taking: Reported on 07/02/2022) 40 tablet 1   No current facility-administered medications for this visit.   Facility-Administered Medications Ordered in Other Visits  Medication Dose Route Frequency Provider Last Rate Last Admin   ado-trastuzumab emtansine (KADCYLA) 360  mg in sodium chloride 0.9 % 250 mL chemo infusion  3.6 mg/kg (Treatment Plan Recorded) Intravenous Once Charlaine Dalton R, MD       heparin lock flush 100 unit/mL  500 Units Intracatheter Once PRN Cammie Sickle, MD       sodium chloride flush (NS) 0.9 % injection 10 mL  10 mL Intracatheter PRN Cammie Sickle, MD          .  PHYSICAL EXAMINATION: ECOG PERFORMANCE STATUS: 0 - Asymptomatic  Vitals:   08/20/22 0834  BP: (!) 113/50  Pulse: 70  Resp: 17  Temp: 98.2 F (36.8 C)  SpO2: 98%     Filed Weights   08/20/22 0834  Weight: 219 lb 3.2 oz (99.4 kg)    Physical Exam Vitals and nursing note reviewed.  HENT:     Head: Normocephalic and atraumatic.     Mouth/Throat:     Pharynx: Oropharynx is clear.  Eyes:     Extraocular Movements: Extraocular movements intact.     Pupils: Pupils are equal, round, and reactive to light.  Cardiovascular:     Rate and Rhythm: Normal rate and regular rhythm.  Pulmonary:     Comments: Decreased breath sounds bilaterally.  Abdominal:     Palpations: Abdomen is soft.  Musculoskeletal:        General: Normal range of motion.     Cervical back: Normal range of motion.  Skin:    General: Skin is warm.  Neurological:     General: No focal deficit present.     Mental Status: She is alert and oriented to person, place, and time.  Psychiatric:        Behavior: Behavior normal.        Judgment: Judgment normal.      LABORATORY DATA:  I have reviewed the data as listed Lab Results  Component Value Date   WBC 3.4 (L) 08/20/2022   HGB 10.5 (L) 08/20/2022   HCT 31.9 (L) 08/20/2022   MCV 84.4 08/20/2022   PLT 157 08/20/2022   Recent Labs    12/27/21 2105 12/28/21 0329 07/02/22 1029 07/30/22 1051 08/20/22 0821  NA 134*   < > 138 136 138  K 3.6   < > 3.4* 3.3* 3.2*  CL 104   < > 103 101 103  CO2 22   < > 26 27 27   GLUCOSE 192*   < > 202* 209* 228*  BUN 16   < > 14 15 12   CREATININE 0.93   < > 0.67 0.71 0.76   CALCIUM 8.6*   < > 8.7* 9.2 8.6*  GFRNONAA >60   < > >60 >60 >60  PROT 7.0   < > 6.9 6.4* 6.6  ALBUMIN 3.7   < > 3.3* 2.9* 3.0*  AST 42*   < > 23 19 28   ALT 51*   < > 17 15 20   ALKPHOS 70   < > 55 50 47  BILITOT 1.1   < > 0.5 0.5 0.6  BILIDIR 0.2  --   --   --   --  IBILI 0.9  --   --   --   --    < > = values in this interval not displayed.    RADIOGRAPHIC STUDIES: I have personally reviewed the radiological images as listed and agreed with the findings in the report. No results found.  ASSESSMENT & PLAN:   Carcinoma of upper-outer quadrant of right breast in female, estrogen receptor positive ([place]) # MAY 2023- STAGE III-T4N1 ER/PR positive HER2 POSITIVE right breast cancer; LN-positive-ER/PR positive her-2 NEGATIVE; # Currently s/p neoadjuvant chemotherapy- TCH plus P x 6 cycles- s/p Riht mastectomy-[15th, NOv 2023] ypT2 [49mm]; ypN0.  Given partial response/involvement of the breast skin/multifocal disease-s/p evaluation with Dr. Donella Stade.  Awaiting to start radiation; planned sim on 1/29.  Patient currently on tamoxifen [DEC-JAN 2024]; + OFS- Lupron every 3 months ]. ON Ado-trastuzumab emtansine  for 14 cycles. RT until 3/22.   # proceed with Ado-trastuzumab emtansine #3 of planned 14 cycles.  Patient tolerated treatment well except for-rash/fever see below     MUGA scan May 31st, 2023- -61%. JAN 2024- Normal LEFT ventricular ejection fraction of 57.1% .  # Allergic reaction to Ado-trastuzumab emtansine-fever/rash.  Improved with Claritin. patient currently on Claritin x3 days postchemotherapy; Tylenol preemptively.  Decrease Benadryl 25 mg given drowsiness.  # Hypocalcemia- Continue calcium 1200 plus vitamin D-3 1000-over-the-counter-1 pill a day.  Discussed the role of Zometa every 6 months for 3 years/on adjuvant basis.   Continue ca+Vit D BID. Vit D FEB 2024- 66; HOLD zometa today- slightly low ca.   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   # Risk of lymphedema-  refer to [facility] again.   # Hypokalemia: K dur 3.2- recommend compliance with BID.   # PN G-1-2 sec to Taxotere- monitor for now. Stable.   # PV access: port functional  Lupron 07/09/2022 #1 q 21M; Zometa q 6 M  # DISPOSITION: # Kadcyla today; HOLD Zometa # follow up in 3 week- MD;  port- labs; cbc/cmp; Kadcyla- zometa  Dr.B  All questions were answered. The patient/family knows to call the clinic with any problems, questions or concerns.     Cammie Sickle, MD 08/20/2022 9:46 AM

## 2022-08-23 ENCOUNTER — Ambulatory Visit: Payer: 59

## 2022-08-24 ENCOUNTER — Ambulatory Visit
Admission: RE | Admit: 2022-08-24 | Discharge: 2022-08-24 | Disposition: A | Discharge status: Home / Self Care | Payer: 59 | Source: Ambulatory Visit | Attending: Radiation Oncology | Admitting: Radiation Oncology

## 2022-08-24 ENCOUNTER — Other Ambulatory Visit: Payer: Self-pay | Admitting: *Deleted

## 2022-08-24 ENCOUNTER — Other Ambulatory Visit: Payer: Self-pay

## 2022-08-24 ENCOUNTER — Ambulatory Visit: Payer: 59

## 2022-08-24 DIAGNOSIS — Z5112 Encounter for antineoplastic immunotherapy: Secondary | ICD-10-CM | POA: Diagnosis not present

## 2022-08-24 LAB — RAD ONC ARIA SESSION SUMMARY
Course Elapsed Days: 41
Plan Fractions Treated to Date: 1
Plan Prescribed Dose Per Fraction: 2 Gy
Plan Total Fractions Prescribed: 5
Plan Total Prescribed Dose: 10 Gy
Reference Point Dosage Given to Date: 2 Gy
Reference Point Session Dosage Given: 2 Gy
Session Number: 29

## 2022-08-24 MED ORDER — SILVER SULFADIAZINE 1 % EX CREA: 1.0000 | TOPICAL_CREAM | Freq: Two times a day (BID) | CUTANEOUS | 2 refills | Status: DC

## 2022-08-25 ENCOUNTER — Other Ambulatory Visit: Payer: Self-pay

## 2022-08-25 ENCOUNTER — Ambulatory Visit
Admission: RE | Admit: 2022-08-25 | Discharge: 2022-08-25 | Disposition: A | Discharge status: Home / Self Care | Payer: 59 | Source: Ambulatory Visit | Attending: Radiation Oncology | Admitting: Radiation Oncology

## 2022-08-25 ENCOUNTER — Inpatient Hospital Stay: Payer: 59 | Admitting: Occupational Therapy

## 2022-08-25 DIAGNOSIS — M25611 Stiffness of right shoulder, not elsewhere classified: Secondary | ICD-10-CM

## 2022-08-25 DIAGNOSIS — Z5112 Encounter for antineoplastic immunotherapy: Secondary | ICD-10-CM | POA: Diagnosis not present

## 2022-08-25 LAB — RAD ONC ARIA SESSION SUMMARY
Course Elapsed Days: 42
Plan Fractions Treated to Date: 2
Plan Prescribed Dose Per Fraction: 2 Gy
Plan Total Fractions Prescribed: 5
Plan Total Prescribed Dose: 10 Gy
Reference Point Dosage Given to Date: 4 Gy
Reference Point Session Dosage Given: 2 Gy
Session Number: 30

## 2022-08-25 NOTE — Therapy (Signed)
[place] at [facility] [address] [place], [place], [address] Phone: [phone]   Fax:  [phone]  Occupational Therapy Screen:  Patient Details  Name: Marilyn Page MRN: [id number] Date of Birth: [birth year] No data recorded  Encounter Date: 08/25/2022   OT End of Session - 08/25/22 1525     Visit Number 0             Past Medical History:  Diagnosis Date   Anemia    Carcinoma of upper-outer quadrant of right breast in female, estrogen receptor positive ([place]) 10/26/2021   Diabetes mellitus without complication ([place])    Family history of adverse reaction to anesthesia    grandmother had issues but doesnt know what the issue was, she was older.   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 Axilla Butterfly hydro clip-MACRO METASTATIC MAMMARY CARCINOMA   BREAST BIOPSY Right 04/21/2022   [place] RT PLC BREAST LOC DEV   1ST LESION  INC US GUIDE 04/21/2022 [facility]   BREAST RECONSTRUCTION WITH PLACEMENT OF TISSUE EXPANDER AND FLEX HD (ACELLULAR HYDRATED DERMIS) Right 04/21/2022   Procedure: RIGHT BREAST RECONSTRUCTION WITH PLACEMENT OF TISSUE EXPANDER AND FLEX HD (ACELLULAR HYDRATED DERMIS);  Surgeon: Wallace Going, DO;  Location: [facility];  Service: Plastics;  Laterality: Right;   CESAREAN SECTION  2005/2008   DILATION AND CURETTAGE OF UTERUS  2003   PORTACATH PLACEMENT Left 11/09/2021   Procedure: INSERTION PORT-A-CATH;  Surgeon: Robert Bellow, MD;  Location: [facility];  Service: General;  Laterality: Left;   REMOVAL OF TISSUE EXPANDER AND PLACEMENT OF IMPLANT Right 06/08/2022   Procedure: REMOVAL OF TISSUE EXPANDER;  Surgeon: Wallace Going, DO;   Location: [facility];  Service: Plastics;  Laterality: Right;   SIMPLE MASTECTOMY WITH AXILLARY SENTINEL NODE BIOPSY Right 04/21/2022   Procedure: SIMPLE MASTECTOMY WITH AXILLARY SENTINEL NODE BIOPSY;  Surgeon: Robert Bellow, MD;  Location: [facility];  Service: General;  Laterality: Right;  wire localization of lymph node   SKIN BIOPSY Right 11/09/2021   Procedure: PUNCH BIOPSY SKIN, TATTOO LYMPH NODE RIGHT AXILLA;  Surgeon: Robert Bellow, MD;  Location: [facility];  Service: General;  Laterality: Right;   WISDOM TOOTH EXTRACTION      There were no vitals filed for this visit.   Subjective Assessment - 08/25/22 1523     Subjective  Since I seen you in Dec 23 had expanders removed because of infection and needed to start radiation - 3 more session left but starting to blister in armpit and so tight under my arm - cannot really do over head ROM because it open the area- feels like I have swelling under my arm - like tennisball -wanted to check with you again                 LYMPHEDEMA/ONCOLOGY QUESTIONNAIRE - 08/25/22 0001       Right Upper Extremity Lymphedema   15 cm Proximal to Olecranon Process 39.5 cm    10 cm Proximal to Olecranon Process 36.5 cm    Olecranon Process 30 cm    15 cm Proximal to Ulnar Styloid Process 30.5 cm    10 cm Proximal to  Ulnar Styloid Process 26.4 cm      Left Upper Extremity Lymphedema   15 cm Proximal to Olecranon Process 41 cm    10 cm Proximal to Olecranon Process 38.2 cm    Olecranon Process 30 cm    15 cm Proximal to Ulnar Styloid Process 30 cm    10 cm Proximal to Ulnar Styloid Process 26 cm               OT SCREEN 05/19/22: Patient arrived this date postop right mastectomy on 04/21/2022 with immediate reconstruction expanders.  Patient getting feelings at the moment.  Patient with increased stiffness and discomfort in right shoulder and chest underarm.  Patient still have 1 drain in.  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 Patient is right-hand dominant.  Shoulder flexion is 115.  And abduction 80 degrees.  Pain-free. Patient to maintain about 90 degrees of shoulder flexion and abduction pain-free until seeing surgeon and getting okay to work on over head but not until okay by surgeon and drain comes out. Patient is scheduled for simulation for radiation on Monday the 18th. Next appointment with plastic surgery is this Friday, 15 December.     OT SCREEN 05/26/22:   Patient arrived this date postop right mastectomy on 04/21/2022 with immediate reconstruction expanders.  Patient cont to have drain in. Follow up tomorrow again with plastic surgery.  Patient showed increase R shoulder AROM since last time but cont to have stiffness and discomfort in right  axilla and chest underarm with shoulder flexion and ABD.  Patient still have 1 drain in.  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 Patient is right-hand dominant.  Shoulder flexion is 115.  And abduction 90 degrees coming in    Reviewed with patient home program again.  This date provided patient with pulleys to work on shoulder active assisted range of motion flexion and abduction 20 reps keeping pain under 4/10. Followed by supine using wand for shoulder flexion and abduction as well as working on light external rotation. Shoulder flexion increased to 135 degrees and abduction to 115 on R shoulder    Patient has radiation simulator next week and will follow-up with me again.     ASSESSMENT & PLAN DR Rogue Bussing 08/20/22:    Carcinoma of upper-outer quadrant of right breast in female, estrogen receptor positive ([place]) # MAY 2023- STAGE III-T4N1 ER/PR positive HER2 POSITIVE right breast cancer; LN-positive-ER/PR positive her-2 NEGATIVE; # Currently s/p neoadjuvant chemotherapy- TCH plus P x 6 cycles- s/p Riht mastectomy-[15th, NOv 2023] ypT2 [62mm]; ypN0.  Given partial response/involvement of the breast  skin/multifocal disease-s/p evaluation with Dr. Donella Stade.  Awaiting to start radiation; planned sim on 1/29.  Patient currently on tamoxifen [DEC-JAN 2024]; + OFS- Lupron every 3 months ]. ON Ado-trastuzumab emtansine  for 14 cycles. RT until 3/22.    # proceed with Ado-trastuzumab emtansine #3 of planned 14 cycles.  Patient tolerated treatment well except for-rash/fever see below     MUGA scan May 31st, 2023- -61%. JAN 2024- Normal LEFT ventricular ejection fraction of 57.1% .   # Allergic reaction to Ado-trastuzumab emtansine-fever/rash.  Improved with Claritin. patient currently on Claritin x3 days postchemotherapy; Tylenol preemptively.  Decrease Benadryl 25 mg given drowsiness.   # Hypocalcemia- Continue calcium 1200 plus vitamin D-3 1000-over-the-counter-1 pill a day.  Discussed the role of Zometa every 6 months for 3 years/on adjuvant basis.   Continue ca+Vit  D BID. Vit D FEB 2024- 3; HOLD zometa today- slightly low ca.    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    # Risk of lymphedema- refer to [facility] again.    # Hypokalemia: K dur 3.2- recommend compliance with BID.    # PN G-1-2 sec to Taxotere- monitor for now. Stable.    # PV access: port functional   Lupron 07/09/2022 #1 q 43M; Zometa q 6 M   # DISPOSITION: # Kadcyla today; HOLD Zometa # follow up in 3 week- MD;  port- labs; cbc/cmp; Kadcyla- zometa  Dr.B              OT SCREEN 3/20/2    Pt arrive after being seen in Dec 23 last time - since then had L expander removed  because of infection and radiation she has 3 more sessions left- some blistering in axilla and limiting her shoulder ABD more than flexion on L UE. Pt to keep ABD to about 90 degrees- and flexion in pain free motion.  Cannot do any manaul therapy until 4-6 wks after radiation Pt circumference in bilateral UE compare to WNL  Pt with L shoulder rounder and forward - lower than R - pt to do several times during day scapula retraction and  chin tug Pt with tight and tenderness over L upper trap - over use  Pt to do upper trap stretch  Follow up with me again after done with radiation and blisters are healed.                   Visit Diagnosis: Stiffness of right shoulder, not elsewhere classified    Problem List Patient Active Problem List   Diagnosis Date Noted   Acquired absence of right breast 06/18/2022   Low vitamin B12 level 05/24/2022   Hypomagnesemia 05/16/2022   Lactic acid acidosis 03/06/2022   Fever and chills 03/05/2022   COVID-19 virus infection 12/28/2021   Thrombocytopenia ([place]) 12/28/2021   Diabetes mellitus ([place]) 12/28/2021   Febrile neutropenia ([place]) 12/27/2021   Sepsis ([place]) 12/27/2021   Genetic testing 12/01/2021   Carcinoma of upper-outer quadrant of right breast in female, estrogen receptor positive ([place]) 10/26/2021   Uterus, adenomyosis 05/14/2021   ASCUS of cervix with negative high risk HPV 04/23/2021   Iron deficiency anemia due to chronic blood loss 04/23/2021   Type 2 diabetes mellitus without complication, without long-term current use of insulin ([place]) 05/18/2020   RLS (restless legs syndrome) 05/14/2020   Menorrhagia with regular cycle 10/14/2017   BMI 37.0-37.9, adult 10/03/2017   History of gestational diabetes 10/03/2017    Marilyn Page, Marilyn Page,Marilyn Page 08/25/2022, 3:25 PM  [place] at [facility] [address], [place], [place], [address] Phone: [phone]   Fax:  [phone]  Name: Marilyn Page MRN: [id number] Date of Birth: April 22, 1976

## 2022-08-26 ENCOUNTER — Other Ambulatory Visit: Payer: Self-pay

## 2022-08-26 ENCOUNTER — Ambulatory Visit
Admission: RE | Admit: 2022-08-26 | Discharge: 2022-08-26 | Disposition: A | Discharge status: Home / Self Care | Payer: 59 | Source: Ambulatory Visit | Attending: Radiation Oncology | Admitting: Radiation Oncology

## 2022-08-26 DIAGNOSIS — Z5112 Encounter for antineoplastic immunotherapy: Secondary | ICD-10-CM | POA: Diagnosis not present

## 2022-08-26 LAB — RAD ONC ARIA SESSION SUMMARY
Course Elapsed Days: 43
Plan Fractions Treated to Date: 3
Plan Prescribed Dose Per Fraction: 2 Gy
Plan Total Fractions Prescribed: 5
Plan Total Prescribed Dose: 10 Gy
Reference Point Dosage Given to Date: 6 Gy
Reference Point Session Dosage Given: 2 Gy
Session Number: 31

## 2022-08-27 ENCOUNTER — Other Ambulatory Visit: Payer: Self-pay

## 2022-08-27 ENCOUNTER — Ambulatory Visit
Admission: RE | Admit: 2022-08-27 | Discharge: 2022-08-27 | Disposition: A | Discharge status: Home / Self Care | Payer: 59 | Source: Ambulatory Visit | Attending: Radiation Oncology | Admitting: Radiation Oncology

## 2022-08-27 DIAGNOSIS — Z5112 Encounter for antineoplastic immunotherapy: Secondary | ICD-10-CM | POA: Diagnosis not present

## 2022-08-27 LAB — RAD ONC ARIA SESSION SUMMARY
Course Elapsed Days: 44
Plan Fractions Treated to Date: 4
Plan Prescribed Dose Per Fraction: 2 Gy
Plan Total Fractions Prescribed: 5
Plan Total Prescribed Dose: 10 Gy
Reference Point Dosage Given to Date: 8 Gy
Reference Point Session Dosage Given: 2 Gy
Session Number: 32

## 2022-08-30 ENCOUNTER — Ambulatory Visit
Admission: RE | Admit: 2022-08-30 | Discharge: 2022-08-30 | Disposition: A | Discharge status: Home / Self Care | Payer: 59 | Source: Ambulatory Visit | Attending: Radiation Oncology | Admitting: Radiation Oncology

## 2022-08-30 ENCOUNTER — Other Ambulatory Visit: Payer: Self-pay

## 2022-08-30 ENCOUNTER — Encounter: Payer: Self-pay | Admitting: *Deleted

## 2022-08-30 DIAGNOSIS — Z5112 Encounter for antineoplastic immunotherapy: Secondary | ICD-10-CM | POA: Diagnosis not present

## 2022-08-30 LAB — RAD ONC ARIA SESSION SUMMARY
Course Elapsed Days: 47
Plan Fractions Treated to Date: 5
Plan Prescribed Dose Per Fraction: 2 Gy
Plan Total Fractions Prescribed: 5
Plan Total Prescribed Dose: 10 Gy
Reference Point Dosage Given to Date: 10 Gy
Reference Point Session Dosage Given: 2 Gy
Session Number: 33

## 2022-09-10 ENCOUNTER — Inpatient Hospital Stay: Payer: 59 | Attending: Internal Medicine

## 2022-09-10 ENCOUNTER — Encounter: Payer: Self-pay | Admitting: Internal Medicine

## 2022-09-10 ENCOUNTER — Inpatient Hospital Stay (HOSPITAL_BASED_OUTPATIENT_CLINIC_OR_DEPARTMENT_OTHER): Payer: 59 | Admitting: Internal Medicine

## 2022-09-10 ENCOUNTER — Inpatient Hospital Stay: Payer: 59

## 2022-09-10 VITALS — BP 125/48 | HR 78 | Temp 97.4°F | Resp 16 | Wt 218.0 lb

## 2022-09-10 DIAGNOSIS — E119 Type 2 diabetes mellitus without complications: Secondary | ICD-10-CM | POA: Diagnosis not present

## 2022-09-10 DIAGNOSIS — Z170 Estrogen receptor positive status [ER+]: Secondary | ICD-10-CM | POA: Insufficient documentation

## 2022-09-10 DIAGNOSIS — Z5112 Encounter for antineoplastic immunotherapy: Secondary | ICD-10-CM | POA: Insufficient documentation

## 2022-09-10 DIAGNOSIS — C773 Secondary and unspecified malignant neoplasm of axilla and upper limb lymph nodes: Secondary | ICD-10-CM | POA: Diagnosis not present

## 2022-09-10 DIAGNOSIS — E876 Hypokalemia: Secondary | ICD-10-CM | POA: Diagnosis not present

## 2022-09-10 DIAGNOSIS — C50411 Malignant neoplasm of upper-outer quadrant of right female breast: Secondary | ICD-10-CM | POA: Insufficient documentation

## 2022-09-10 LAB — COMPREHENSIVE METABOLIC PANEL
ALT: 20 U/L (ref 0–44)
AST: 29 U/L (ref 15–41)
Albumin: 3 g/dL — ABNORMAL LOW (ref 3.5–5.0)
Alkaline Phosphatase: 50 U/L (ref 38–126)
Anion gap: 5 (ref 5–15)
BUN: 12 mg/dL (ref 6–20)
CO2: 27 mmol/L (ref 22–32)
Calcium: 8.3 mg/dL — ABNORMAL LOW (ref 8.9–10.3)
Chloride: 104 mmol/L (ref 98–111)
Creatinine, Ser: 0.69 mg/dL (ref 0.44–1.00)
GFR, Estimated: >60 mL/min (ref >60)
Glucose, Bld: 200 mg/dL — ABNORMAL HIGH (ref 70–99)
Potassium: 3.4 mmol/L — ABNORMAL LOW (ref 3.5–5.1)
Sodium: 136 mmol/L (ref 135–145)
Total Bilirubin: 0.3 mg/dL (ref 0.3–1.2)
Total Protein: 6.8 g/dL (ref 6.5–8.1)

## 2022-09-10 LAB — CBC WITH DIFFERENTIAL/PLATELET
Abs Immature Granulocytes: 0.03 10*3/uL (ref 0.00–0.07)
Basophils Absolute: 0 10*3/uL (ref 0.0–0.1)
Basophils Relative: 1 %
Eosinophils Absolute: 0.1 10*3/uL (ref 0.0–0.5)
Eosinophils Relative: 3 %
HCT: 33.1 % — ABNORMAL LOW (ref 36.0–46.0)
Hemoglobin: 11 g/dL — ABNORMAL LOW (ref 12.0–15.0)
Immature Granulocytes: 1 %
Lymphocytes Relative: 21 %
Lymphs Abs: 0.9 10*3/uL (ref 0.7–4.0)
MCH: 28.5 pg (ref 26.0–34.0)
MCHC: 33.2 g/dL (ref 30.0–36.0)
MCV: 85.8 fL (ref 80.0–100.0)
Monocytes Absolute: 0.3 10*3/uL (ref 0.1–1.0)
Monocytes Relative: 7 %
Neutro Abs: 3 10*3/uL (ref 1.7–7.7)
Neutrophils Relative %: 67 %
Platelets: 159 10*3/uL (ref 150–400)
RBC: 3.86 MIL/uL — ABNORMAL LOW (ref 3.87–5.11)
RDW: 14.7 % (ref 11.5–15.5)
WBC: 4.4 10*3/uL (ref 4.0–10.5)
nRBC: 0 % (ref 0.0–0.2)

## 2022-09-10 MED ORDER — SODIUM CHLORIDE 0.9 % IV SOLN
Freq: Once | INTRAVENOUS | Status: AC
  Filled 2022-09-10: qty 250

## 2022-09-10 MED ORDER — ACETAMINOPHEN 325 MG PO TABS
650.0000 mg | ORAL_TABLET | Freq: Once | ORAL | Status: AC
  Administered 2022-09-10: 650 mg via ORAL
  Filled 2022-09-10: qty 2

## 2022-09-10 MED ORDER — HEPARIN SOD (PORK) LOCK FLUSH 100 UNIT/ML IV SOLN
500.0000 [IU] | Freq: Once | INTRAVENOUS | Status: AC | PRN
  Administered 2022-09-10: 500 [IU]
  Filled 2022-09-10: qty 5

## 2022-09-10 MED ORDER — PROCHLORPERAZINE MALEATE 10 MG PO TABS
10.0000 mg | ORAL_TABLET | Freq: Once | ORAL | Status: AC
  Administered 2022-09-10: 10 mg via ORAL
  Filled 2022-09-10: qty 1

## 2022-09-10 MED ORDER — DIPHENHYDRAMINE HCL 25 MG PO CAPS
25.0000 mg | ORAL_CAPSULE | Freq: Once | ORAL | Status: AC
  Administered 2022-09-10: 25 mg via ORAL
  Filled 2022-09-10: qty 1

## 2022-09-10 MED ORDER — SODIUM CHLORIDE 0.9 % IV SOLN
3.6000 mg/kg | Freq: Once | INTRAVENOUS | Status: AC
  Administered 2022-09-10: 360 mg via INTRAVENOUS
  Filled 2022-09-10: qty 8

## 2022-09-10 NOTE — Progress Notes (Signed)
Pt and husband in for follow up and treatment today.   Denies any concerns.

## 2022-09-10 NOTE — Assessment & Plan Note (Addendum)
#   MAY 2023- STAGE III-T4N1 ER/PR positive HER2 POSITIVE right breast cancer; LN-positive-ER/PR positive her-2 NEGATIVE; # Currently s/p neoadjuvant chemotherapy- TCH plus P x 6 cycles- s/p Riht mastectomy-[15th, NOv 2023] ypT2 [74mm]; ypN0.  Given partial response/involvement of the breast skin/multifocal disease-s/p evaluation with Dr. Aggie Cosier.  Awaiting to start radiation; planned sim on 1/29.  Patient currently on tamoxifen [DEC-JAN 2024]; + OFS- Lupron every 3 months ]. ON Ado-trastuzumab emtansine  for 14 cycles. RT s/p 08/27/2022.   # proceed with Ado-trastuzumab emtansine #4 of planned 14 cycles.  MUGA- JAN 2024- Normal LEFT ventricular ejection fraction of 57.1%.  Will repeat the scan after finishing chemotherapy. Stable.   # Allergic reaction to Ado-trastuzumab emtansine-fever/rash.  Improved with Claritin. patient currently on Claritin x3 days postchemotherapy; Tylenol preemptively.  Decrease Benadryl 25 mg given drowsiness. Stable.   # Hypocalcemia- Continue calcium 1200 plus vitamin D-3 1000-over-the-counter-1 pill a day.  Discussed the role of Zometa every 6 months for 3 years/on adjuvant basis.   Continue ca+Vit D BID. Vit D FEB 2024- 54; HOLD zometa today- slightly low ca [albumin 3.0]  # Diabetes- BG 200 [postbar]- recommend follow up with PCP re: re-adjustments of medications. [OFF ozempic/metformin- intolerance] -   # Radiation dermatitis- G-1- stable  # Risk of lymphedema- s/p  Maureen evaluation. Recommend continue follow up.   # Hypokalemia: continue Kdur BID.   # PV access: port functional  Lupron 07/09/2022 #1 q 15M; Zometa q 6 M- not given  # DISPOSITION: # Kadcyla today; HOLD Zometa # follow up in 3 week- MD;  port- labs; cbc/cmp; Kadcyla-Dr.B

## 2022-09-10 NOTE — Progress Notes (Signed)
[name] NOTE  Patient Care Team: Latanya Maudlin, NP as PCP - General (Family Medicine) Daiva Huge, RN as Oncology Nurse Navigator Cammie Sickle, [name]  CHIEF COMPLAINTS/PURPOSE OF CONSULTATION: Breast cancer  Oncology History Overview Note  MAY 2023-    Targeted ultrasound is performed, showing a 2.9 x 1.9 x 2.0 cm irregular hypoechoic mass right breast 10 o'clock position 6 cm from nipple at the site of palpable concern.   There is an abnormal 8 mm thickened right axillary lymph node.   There is skin thickening overlying the right breast. ------------------  A. BREAST, RIGHT AT 10:00, 6 CM FROM THE NIPPLE; ULTRASOUND-GUIDED CORE  NEEDLE BIOPSY:  - INVASIVE MAMMARY CARCINOMA, NO SPECIAL TYPE.   Size of invasive carcinoma: 11 mm in this sample  Histologic grade of invasive carcinoma: Grade 2                       Glandular/tubular differentiation score: 3                       Nuclear pleomorphism score: 2                       Mitotic rate score: 1                       Total score: 6  Ductal carcinoma in situ: Present, intermediate grade  Lymphovascular invasion: Not identified   B. LYMPH NODE, RIGHT AXILLARY; ULTRASOUND-GUIDED CORE NEEDLE BIOPSY:  - 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 IN GREATEST  EXTENT.   Comment:  The malignancy in the primary breast lesion appears morphologically  different from tumor in the lymph node sampling, with tumor present in  lymph node more suggestive of a histologic grade 3, with significantly  increased mitotic activity and pleomorphism. Due to the discrepancy in  these morphologic patterns, ER/PR/HER2 immunohistochemistry will be  performed on both blocks A1 and B1, with reflex to [place] for HER2 2+. The  results will be reported in an addendum.   ADDENDUM:  CASE SUMMARY: BREAST BIOMARKER TESTS - A - BREAST, RIGHT AT 10:00  Estrogen Receptor  (ER) Status: POSITIVE          Percentage of cells with nuclear positivity: 71-80%          Average intensity of staining: Strong   Progesterone Receptor (PgR) Status: POSITIVE          Percentage of cells with nuclear positivity: 81-90%          Average intensity of staining: Strong   HER2 (by immunohistochemistry): POSITIVE (Score 3+)       Percentage of cells with uniform intense complete membrane  staining: 61-70%  HER2 displays a heterogeneous staining pattern, with areas showing a  strong 3+ staining pattern, and other regions with complete absence (0+)  of staining.   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  Discussed with Dr. [name] tumor heterogeneity. BREAST MRI- JUNE 2023- The patient's known malignancy in the upper outer quadrant of the right breast measures 3.8 x 2.8 x 3.6 cm. Numerous other suspicious masses are identified in the right breast as above involving the upper outer and upper inner quadrants, located both anterior and posterior to the known malignancy.  Numerous enhancing foci in the skin as above are worrisome for skin metastases ; Bx proven. Biopsy proven metastatic node in the right axilla.  No evidence of malignancy in the left breast;  Two mildly prominent right internal mammary nodes were not FDG avid on recent PET-CT imaging.  Currently on neoadjuvant chemotherapy- TCH  plus P x6 cycles;  MUGA scan May 31st, 2023- -61%.    # June 7th, 2023- NEO-ADJUVANT CHEMO- TCH+P x6 cycles- s/p Mastectomy [NOV 15th, 2023] [Dr.Byrnett]-  ypT2 [81mm]; ypN0.  # JAN mid, 2024 -TAMOXFEN 20 mg a day; ADD LupronFEB 2nd.2 2024  # FEB 2nd, 2023- Kadcyla q 3 W x14 cycles;   # Lupron q3 M [/start 2/07-2022]   # Genetic counseling s/p- NEG for any deleterious mutations.     Carcinoma of upper-outer quadrant of right breast in female, estrogen receptor positive  10/26/2021 Initial Diagnosis   Carcinoma of upper-outer quadrant of right breast in female, estrogen receptor positive (HCC)   10/26/2021 Cancer Staging   Staging form: Breast, AJCC 8th Edition - Clinical: Stage IIIA (cT4d, cN1, cM0, G2, ER+, PR+, HER2+) - Signed by [name], MD on 06/04/2022 Histologic grading system: 3 grade system   11/10/2021 - 03/01/2022 Chemotherapy   Patient is on Treatment Plan : BREAST  Docetaxel + Carboplatin + Trastuzumab + Pertuzumab  (TCHP) q21d      11/11/2021 - 01/13/2022 Chemotherapy   Patient is on Treatment Plan : BREAST  Docetaxel + Carboplatin + Trastuzumab + Pertuzumab  (TCHP) q21d       Genetic Testing   Negative genetic testing. No pathogenic variants identified on the [facility] CancerNext-Expanded+RNA panel. The report date is 11/29/2021.  The CancerNext-Expanded + RNAinsight gene panel offered by [employer] and includes sequencing and rearrangement analysis for the following 77 genes: IP, ALK, APC*, ATM*, AXIN2, BAP1, BARD1, BLM, BMPR1A, BRCA1*, BRCA2*, BRIP1*, CDC73, CDH1*,CDK4, CDKN1B, CDKN2A, CHEK2*, CTNNA1, DICER1, FANCC, FH, FLCN, GALNT12, KIF1B, LZTR1, MAX, MEN1, MET, MLH1*, MSH2*, MSH3, MSH6*, MUTYH*, NBN, NF1*, NF2, NTHL1, PALB2*, PHOX2B, PMS2*, POT1, PRKAR1A, PTCH1, PTEN*, RAD51C*, RAD51D*,RB1, RECQL, RET, SDHA, SDHAF2, SDHB, SDHC, SDHD, SMAD4, SMARCA4, SMARCB1, SMARCE1, STK11, SUFU, TMEM127, TP53*,TSC1, TSC2, VHL and XRCC2 (sequencing and deletion/duplication); EGFR,  EGLN1, HOXB13, KIT, MITF, PDGFRA, POLD1 and POLE (sequencing only); EPCAM and GREM1 (deletion/duplication only).   07/09/2022 -  Chemotherapy   Patient is on Treatment Plan : BREAST ADO-Trastuzumab Emtansine (Kadcyla) q21d      HISTORY OF PRESENTING ILLNESS: Patient is ambulating independently.  Accompanied by her husband.   Marilyn Page 47 y.o.  female right breast TRIPLE POSITIVE with T4- Stage III -s/p mastectomy-with expanders-currently on adjuvant kadcyla / tamoxifen + OFS  is here for follow-up.    Pt and husband in for follow up and treatment today.  Denies any concerns.   Patient had no further fever and rash after her last kadcyla infusion.  Continues  to take Claritin; and NSAIDs.   Patient oral Potassium.  Thinks this could be causing itch.   improvement in neuropathy and fingernail health.   Review of Systems  Constitutional:  Positive for malaise/fatigue. Negative for chills, diaphoresis, fever and weight loss.  HENT:  Negative for nosebleeds and sore throat.   Eyes:  Negative for double vision.  Respiratory:  Negative for cough, hemoptysis, sputum production, shortness of breath and wheezing.   Cardiovascular:  Negative for chest pain, palpitations, orthopnea and leg swelling.  Gastrointestinal:  Negative for abdominal pain, blood in stool, constipation, diarrhea, heartburn, melena, nausea and vomiting.  Genitourinary:  Negative for dysuria, frequency and urgency.  Musculoskeletal:  Positive for myalgias. Negative for back pain and joint pain.  Skin: Negative.  Negative for itching and rash.  Neurological:  Negative for dizziness, tingling, focal weakness, weakness and headaches.  Endo/Heme/Allergies:  Does not bruise/bleed easily.  Psychiatric/Behavioral:  Negative for depression. The patient is not nervous/anxious and does not have insomnia.      MEDICAL HISTORY:  Past Medical History:  Diagnosis Date   Anemia    Carcinoma of upper-outer quadrant of right breast in  female, estrogen receptor positive 10/26/2021   Diabetes mellitus without complication    Family history of adverse reaction to anesthesia    grandmother had issues but doesnt know what the issue was, she was older.   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 Axilla Butterfly hydro clip-MACRO METASTATIC MAMMARY CARCINOMA   BREAST BIOPSY Right 04/21/2022   [place] RT PLC BREAST LOC DEV   1ST LESION  INC US GUIDE 04/21/2022 [facility]   BREAST RECONSTRUCTION WITH PLACEMENT OF TISSUE EXPANDER AND FLEX HD (ACELLULAR HYDRATED DERMIS) Right 04/21/2022   Procedure: RIGHT BREAST RECONSTRUCTION WITH PLACEMENT OF TISSUE EXPANDER AND FLEX HD (ACELLULAR HYDRATED DERMIS);  Surgeon: Peggye Form, DO;  Location: [facility] ORS;  Service: Plastics;  Laterality: Right;   CESAREAN SECTION  2005/2008   DILATION AND CURETTAGE OF UTERUS  2003   PORTACATH PLACEMENT Left 11/09/2021   Procedure: INSERTION PORT-A-CATH;  Surgeon: Earline Mayotte, MD;  Location: [facility];  Service: General;  Laterality: Left;   REMOVAL OF TISSUE EXPANDER AND PLACEMENT OF IMPLANT Right 06/08/2022   Procedure: REMOVAL OF TISSUE EXPANDER;  Surgeon: Peggye Form, DO;  Location: [facility];  Service: Plastics;  Laterality: Right;   SIMPLE MASTECTOMY WITH AXILLARY SENTINEL NODE BIOPSY Right 04/21/2022   Procedure: SIMPLE MASTECTOMY WITH AXILLARY SENTINEL NODE BIOPSY;  Surgeon: Earline Mayotte, MD;  Location: [facility];  Service: General;  Laterality: Right;  wire localization of lymph node   SKIN BIOPSY Right 11/09/2021   Procedure: PUNCH BIOPSY SKIN, TATTOO LYMPH NODE RIGHT AXILLA;  Surgeon: Earline Mayotte, MD;  Location: [facility];  Service: General;  Laterality:  Right;   WISDOM TOOTH EXTRACTION      SOCIAL HISTORY: Social History   Socioeconomic History   Marital status: Married    Spouse name: Fraser Din   Number of children: 2   Years of education: Not on file   Highest education level: Not on file  Occupational History   Occupation: and Architectural technologist  Tobacco Use   Smoking status: Never   Smokeless tobacco:  Never  Vaping Use   Vaping Use: Never used  Substance and Sexual Activity   Alcohol use: Never   Drug use: Never   Sexual activity: Yes    Birth control/protection: Other-see comments, Surgical    Comment: vasectomy  Other Topics Concern   Not on file  Social History Narrative   Pre-school teacher; in [place]; 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    Social Determinants of Health   Financial Resource Strain: Not on file  Food Insecurity: Not on file  Transportation Needs: Not on file  Physical Activity: Inactive (09/26/2017)   Exercise Vital Sign    Days of Exercise per Week: 0 days    Minutes of Exercise per Session: 0 min  Stress: Not on file  Social Connections: Not on file  Intimate Partner Violence: Not on file    FAMILY HISTORY: Family History  Problem Relation Age of Onset   Ovarian cysts Mother    Migraines Mother    Melanoma Mother        on leg   Hyperlipidemia Father    Diabetes Maternal Aunt    Stomach cancer Paternal Aunt    Diabetes Maternal Grandfather    Lymphoma Cousin 17   Breast cancer Neg Hx     ALLERGIES:  is allergic to metformin and oxycodone.  MEDICATIONS:  Current Outpatient Medications  Medication Sig Dispense Refill   acetaminophen (TYLENOL) 325 MG tablet Take 2 tablets (650 mg total) by mouth every 6 (six) hours as needed for mild pain (or Fever >/= 101).     ascorbic acid (VITAMIN C) 500 MG tablet Take 1 tablet (500 mg total) by mouth daily. 30 tablet    calcium-vitamin D (OSCAL WITH D) 500-5 MG-MCG tablet Take 1 tablet by mouth 2 (two) times daily. 60 tablet 2   Cyanocobalamin (VITAMIN  B-12 PO) Take 1 tablet by mouth daily.     ferrous sulfate 325 (65 FE) MG tablet Take 325 mg by mouth 2 (two) times daily with a meal.     lidocaine-prilocaine (EMLA) cream Apply on the port. 30 -45 min  prior to port access. 30 g 3   loratadine (CLARITIN) 10 MG tablet Take 10 mg by mouth as needed for allergies. Day before tx, day of and day after.     Multiple Vitamin (MULTIVITAMIN WITH MINERALS) TABS tablet Take 1 tablet by mouth daily.     ONETOUCH VERIO test strip SMARTSIG:1 Each Via Meter Daily PRN     potassium chloride (KLOR-CON) 20 MEQ packet Take 20 mEq by mouth 2 (two) times daily. 60 packet 3   silver sulfADIAZINE (SILVADENE) 1 % cream Apply 1 Application topically 2 (two) times daily. 50 g 2   tamoxifen (NOLVADEX) 20 MG tablet TAKE 1 TABLET BY MOUTH EVERY DAY 90 tablet 1   Magnesium Cl-Calcium Carbonate (SLOW MAGNESIUM/CALCIUM) 70-117 MG TBEC Take 1 tablet by mouth 2 (two) times daily. (Patient not taking: Reported on 07/15/2022) 60 tablet 6   mupirocin ointment (BACTROBAN) 2 % Apply 1 Application topically 2 (two) times daily. (Patient not taking: Reported on 09/10/2022) 30 g 0   ondansetron (ZOFRAN) 4 MG tablet Take 1 tablet (4 mg total) by mouth every 8 (eight) hours as needed for up to 20 doses for nausea or vomiting. (Patient not taking: Reported on 08/20/2022) 20 tablet 0   ondansetron (ZOFRAN-ODT) 8 MG disintegrating tablet Take 8 mg by mouth every 8 (eight) hours as needed for nausea or vomiting. (Patient not taking: Reported on 07/02/2022)  prochlorperazine (COMPAZINE) 10 MG tablet Take 1 tablet (10 mg total) by mouth every 6 (six) hours as needed for nausea or vomiting. (Patient not taking: Reported on 07/02/2022) 40 tablet 1   No current facility-administered medications for this visit.   Facility-Administered Medications Ordered in Other Visits  Medication Dose Route Frequency Provider Last Rate Last Admin   ado-trastuzumab emtansine (KADCYLA) 360 mg in sodium chloride 0.9 %  250 mL chemo infusion  3.6 mg/kg (Treatment Plan Recorded) Intravenous Once Louretta Shorten R, MD       heparin lock flush 100 unit/mL  500 Units Intracatheter Once PRN [name], MD          .  PHYSICAL EXAMINATION: ECOG PERFORMANCE STATUS: 0 - Asymptomatic  Vitals:   09/10/22 0935  BP: (!) 125/48  Pulse: 78  Resp: 16  Temp: (!) 97.4 F (36.3 C)  SpO2: 100%     Filed Weights   09/10/22 0935  Weight: 218 lb (98.9 kg)    Physical Exam Vitals and nursing note reviewed.  HENT:     Head: Normocephalic and atraumatic.     Mouth/Throat:     Pharynx: Oropharynx is clear.  Eyes:     Extraocular Movements: Extraocular movements intact.     Pupils: Pupils are equal, round, and reactive to light.  Cardiovascular:     Rate and Rhythm: Normal rate and regular rhythm.  Pulmonary:     Comments: Decreased breath sounds bilaterally.  Abdominal:     Palpations: Abdomen is soft.  Musculoskeletal:        General: Normal range of motion.     Cervical back: Normal range of motion.  Skin:    General: Skin is warm.  Neurological:     General: No focal deficit present.     Mental Status: She is alert and oriented to person, place, and time.  Psychiatric:        Behavior: Behavior normal.        Judgment: Judgment normal.      LABORATORY DATA:  I have reviewed the data as listed Lab Results  Component Value Date   WBC 4.4 09/10/2022   HGB 11.0 (L) 09/10/2022   HCT 33.1 (L) 09/10/2022   MCV 85.8 09/10/2022   PLT 159 09/10/2022   Recent Labs    12/27/21 2105 12/28/21 0329 07/30/22 1051 08/20/22 0821 09/10/22 0857  NA 134*   < > 136 138 136  K 3.6   < > 3.3* 3.2* 3.4*  CL 104   < > 101 103 104  CO2 22   < > GLUCOSE 192*   < > 209* 228* 200*  BUN 16   < > CREATININE 0.93   < > 0.71 0.76 0.69  CALCIUM 8.6*   < > 9.2 8.6* 8.3*  GFRNONAA >60   < > >60 >60 >60  PROT 7.0   < > 6.4* 6.6 6.8  ALBUMIN 3.7   < > 2.9* 3.0* 3.0*  AST 42*    < > ALT 51*   < > ALKPHOS 70   < > 50 47 50  BILITOT 1.1   < > 0.5 0.6 0.3  BILIDIR 0.2  --   --   --   --   IBILI 0.9  --   --   --   --    < > = values in this interval not  displayed.    RADIOGRAPHIC STUDIES: I have personally reviewed the radiological images as listed and agreed with the findings in the report. No results found.  ASSESSMENT & PLAN:   Carcinoma of upper-outer quadrant of right breast in female, estrogen receptor positive (HCC) # MAY 2023- STAGE III-T4N1 ER/PR positive HER2 POSITIVE right breast cancer; LN-positive-ER/PR positive her-2 NEGATIVE; # Currently s/p neoadjuvant chemotherapy- TCH plus P x 6 cycles- s/p Riht mastectomy-[15th, NOv 2023] ypT2 [75mm]; ypN0.  Given partial response/involvement of the breast skin/multifocal disease-s/p evaluation with Dr. Aggie Cosier.  Awaiting to start radiation; planned sim on 1/29.  Patient currently on tamoxifen [DEC-JAN 2024]; + OFS- Lupron every 3 months ]. ON Ado-trastuzumab emtansine  for 14 cycles. RT s/p 08/27/2022.   # proceed with Ado-trastuzumab emtansine #4 of planned 14 cycles.  MUGA- JAN 2024- Normal LEFT ventricular ejection fraction of 57.1%.  Will repeat the scan after finishing chemotherapy. Stable.   # Allergic reaction to Ado-trastuzumab emtansine-fever/rash.  Improved with Claritin. patient currently on Claritin x3 days postchemotherapy; Tylenol preemptively.  Decrease Benadryl 25 mg given drowsiness. Stable.   # Hypocalcemia- Continue calcium 1200 plus vitamin D-3 1000-over-the-counter-1 pill a day.  Discussed the role of Zometa every 6 months for 3 years/on adjuvant basis.   Continue ca+Vit D BID. Vit D FEB 2024- 54; HOLD zometa today- slightly low ca [albumin 3.0]  # Diabetes- BG 200 [postbar]- recommend follow up with PCP re: re-adjustments of medications. [OFF ozempic/metformin- intolerance] -   # Radiation dermatitis- G-1- stable  # Risk of lymphedema- s/p  Maureen evaluation. Recommend  continue follow up.   # Hypokalemia: continue Kdur BID.   # PV access: port functional  Lupron 07/09/2022 #1 q 44M; Zometa q 6 M- not given  # DISPOSITION: # Kadcyla today; HOLD Zometa # follow up in 3 week- MD;  port- labs; cbc/cmp; Kadcyla-Dr.B  All questions were answered. The patient/family knows to call the clinic with any problems, questions or concerns.     [name], MD 09/10/2022 10:48 AM

## 2022-09-10 NOTE — Patient Instructions (Signed)
[place] [facility]  Discharge Instructions: Thank you for choosing [facility] to provide your oncology and hematology care.  If you have a lab appointment with the Cancer Center, please go directly to the Cancer Center and check in at the registration area.  Wear comfortable clothing and clothing appropriate for easy access to any Portacath or PICC line.   We strive to give you quality time with your provider. You may need to reschedule your appointment if you arrive late (15 or more minutes).  Arriving late affects you and other patients whose appointments are after yours.  Also, if you miss three or more appointments without notifying the office, you may be dismissed from the clinic at the provider's discretion.      For prescription refill requests, have your pharmacy contact our office and allow 72 hours for refills to be completed.    Today you received the following chemotherapy and/or immunotherapy agents KADCYLA      To help prevent nausea and vomiting after your treatment, we encourage you to take your nausea medication as directed.  BELOW ARE SYMPTOMS THAT SHOULD BE REPORTED IMMEDIATELY: *FEVER GREATER THAN 100.4 F (38 C) OR HIGHER *CHILLS OR SWEATING *NAUSEA AND VOMITING THAT IS NOT CONTROLLED WITH YOUR NAUSEA MEDICATION *UNUSUAL SHORTNESS OF BREATH *UNUSUAL BRUISING OR BLEEDING *URINARY PROBLEMS (pain or burning when urinating, or frequent urination) *BOWEL PROBLEMS (unusual diarrhea, constipation, pain near the anus) TENDERNESS IN MOUTH AND THROAT WITH OR WITHOUT PRESENCE OF ULCERS (sore throat, sores in mouth, or a toothache) UNUSUAL RASH, SWELLING OR PAIN  UNUSUAL VAGINAL DISCHARGE OR ITCHING   Items with * indicate a potential emergency and should be followed up as soon as possible or go to the Emergency Department if any problems should occur.  Please show the CHEMOTHERAPY ALERT CARD or IMMUNOTHERAPY ALERT CARD at check-in to  the Emergency Department and triage nurse.  Should you have questions after your visit or need to cancel or reschedule your appointment, please contact [facility]  [phone] and follow the prompts.  Office hours are 8:00 a.m. to 4:30 p.m. Monday - Friday. Please note that voicemails left after 4:00 p.m. may not be returned until the following business day.  We are closed weekends and major holidays. You have access to a nurse at all times for urgent questions. Please call the main number to the clinic [phone] and follow the prompts.  For any non-urgent questions, you may also contact your provider using MyChart. We now offer e-Visits for anyone 72 and older to request care online for non-urgent symptoms. For details visit [email or web address].   Also download the MyChart app! Go to the app store, search "MyChart", open the app, select [facility], and log in with your MyChart username and password.  Ado-Trastuzumab Emtansine Injection What is this medication? ADO-TRASTUZUMAB EMTANSINE (ADD oh traz TOO zuh mab em TAN zine) treats breast cancer. It works by blocking a protein that causes cancer cells to grow and multiply. This helps to slow or stop the spread of cancer cells. This medicine may be used for other purposes; ask your health care provider or pharmacist if you have questions. COMMON BRAND NAME(S): Kadcyla What should I tell my care team before I take this medication? They need to know if you have any of these conditions: Heart failure Liver disease Low platelet levels Lung disease Tingling of the fingers or toes or other nerve disorder An unusual or  allergic reaction to ado-trastuzumab emtansine, other medications, foods, dyes, or preservatives Pregnant or trying to get pregnant Breast-feeding How should I use this medication? This medication is infused into a vein. It is given by your care team in a hospital or clinic setting. Talk to  your care team about the use of this medication in children. Special care may be needed. Overdosage: If you think you have taken too much of this medicine contact a poison control center or emergency room at once. NOTE: This medicine is only for you. Do not share this medicine with others. What if I miss a dose? Keep appointments for follow-up doses. It is important not to miss your dose. Call your care team if you are unable to keep an appointment. What may interact with this medication? Atazanavir Boceprevir Clarithromycin Dalfopristin; quinupristin Delavirdine Indinavir Isoniazid, INH Itraconazole Ketoconazole Nefazodone Nelfinavir Ritonavir Telaprevir Telithromycin Tipranavir Voriconazole This list may not describe all possible interactions. Give your health care provider a list of all the medicines, herbs, non-prescription drugs, or dietary supplements you use. Also tell them if you smoke, drink alcohol, or use illegal drugs. Some items may interact with your medicine. What should I watch for while using this medication? This medication may make you feel generally unwell. This is not uncommon, as chemotherapy can affect healthy cells as well as cancer cells. Report any side effects. Continue your course of treatment even though you feel ill unless your care team tells you to stop. You may need blood work while taking this medication. This medication may increase your risk to bruise or bleed. Call your care team if you notice any unusual bleeding. Be careful brushing or flossing your teeth or using a toothpick because you may get an infection or bleed more easily. If you have any dental work done, tell your dentist you are receiving this medication. Talk to your care team if you may be pregnant. Serious birth defects can occur if you take this medication during pregnancy and for 7 months after the last dose. You will need a negative pregnancy test before starting this medication.  Contraception is recommended while taking this medication and for 7 months after the last dose. Your care team can help you find the option that works for you. If your partner can get pregnant, use a condom during sex while taking this medication and for 4 months after the last dose. Do not breastfeed while taking this medication and for 7 months after the last dose. This medication may cause infertility. Talk to your care team if you are concerned with your fertility. What side effects may I notice from receiving this medication? Side effects that you should report to your care team as soon as possible: Allergic reactions--skin rash, itching, hives, swelling of the face, lips, tongue, or throat Bleeding--bloody or black, tar-like stools, vomiting blood or brown material that looks like coffee grounds, red or dark brown urine, small red or purple spots on skin, unusual bruising or bleeding Dry cough, shortness of breath or trouble breathing Heart failure--shortness of breath, swelling of the ankles, feet, or hands, sudden weight gain, unusual weakness or fatigue Infusion reactions--chest pain, shortness of breath or trouble breathing, feeling faint or lightheaded Liver injury--right upper belly pain, loss of appetite, nausea, light-colored stool, dark yellow or brown urine, yellowing skin or eyes, unusual weakness or fatigue Pain, tingling, or numbness in the hands or feet Painful swelling, warmth, or redness of the skin, blisters or sores at the infusion site Side  effects that usually do not require medical attention (report to your care team if they continue or are bothersome): Constipation Fatigue Headache Muscle pain Nausea This list may not describe all possible side effects. Call your doctor for medical advice about side effects. You may report side effects to FDA at 1-800-FDA-1088. Where should I keep my medication? This medication is given in a hospital or clinic. It will not be stored  at home. NOTE: This sheet is a summary. It may not cover all possible information. If you have questions about this medicine, talk to your doctor, pharmacist, or health care provider.  2023 [employer]/Gold Standard (2021-10-07 00:00:00)

## 2022-09-13 ENCOUNTER — Encounter: Payer: Self-pay | Admitting: *Deleted

## 2022-09-21 ENCOUNTER — Other Ambulatory Visit: Payer: Self-pay

## 2022-09-21 ENCOUNTER — Encounter: Payer: Self-pay | Admitting: Internal Medicine

## 2022-09-21 ENCOUNTER — Encounter: Payer: Self-pay | Admitting: Nurse Practitioner

## 2022-09-21 ENCOUNTER — Inpatient Hospital Stay (HOSPITAL_BASED_OUTPATIENT_CLINIC_OR_DEPARTMENT_OTHER): Payer: 59 | Admitting: Nurse Practitioner

## 2022-09-21 DIAGNOSIS — J0110 Acute frontal sinusitis, unspecified: Secondary | ICD-10-CM | POA: Diagnosis not present

## 2022-09-21 MED ORDER — HYDROCOD POLI-CHLORPHE POLI ER 10-8 MG/5ML PO SUER
5.0000 mL | Freq: Every evening | ORAL | 0 refills | Status: DC | PRN
Start: 2022-09-21 — End: 2022-11-09

## 2022-09-21 MED ORDER — BENZONATATE 200 MG PO CAPS
200.0000 mg | ORAL_CAPSULE | Freq: Three times a day (TID) | ORAL | 0 refills | Status: DC | PRN
Start: 2022-09-21 — End: 2022-11-09

## 2022-09-21 MED ORDER — AMOXICILLIN-POT CLAVULANATE 875-125 MG PO TABS: 1.0000 | ORAL_TABLET | Freq: Two times a day (BID) | ORAL | 0 refills | Status: AC

## 2022-09-21 NOTE — Progress Notes (Signed)
Virtual Visit Progress Note  Symptom Management Clinic  [facility] at [facility] Department of the [place]. [facility] [address], [address] [place], [place] [address] [phone] ([phone] (fax)  I connected with Marilyn Page on 09/21/22 at  1:30 PM EDT by video enabled telemedicine visit and verified that I am speaking with the correct person using two identifiers.   I discussed the limitations, risks, security and privacy concerns of performing an evaluation and management service by telemedicine and the availability of in-person appointments. I also discussed with the patient that there may be a patient responsible charge related to this service. The patient expressed understanding and agreed to proceed.   Other persons participating in the visit and their role in the encounter: none   Patient's location: car/parked  Provider's location: clinic   Chief Complaint: cough, sinus pain and congestion    [name], [name], [name], [name]   Name of the patient: Marilyn Page  [id number]  08/23/1975   Date of visit: 09/21/22  Diagnosis- Breast cancer  Heme/Onc history:  Oncology History Overview Note  MAY 2023-    Targeted ultrasound is performed, showing a 2.9 x 1.9 x 2.0 cm irregular hypoechoic mass right breast 10 o'clock position 6 cm from nipple at the site of palpable concern.   There is an abnormal 8 mm thickened right axillary lymph node.   There is skin thickening overlying the right breast. ------------------  A. BREAST, RIGHT AT 10:00, 6 CM FROM THE NIPPLE; ULTRASOUND-GUIDED CORE  NEEDLE BIOPSY:  - INVASIVE MAMMARY CARCINOMA, NO SPECIAL TYPE.   Size of invasive carcinoma: 11 mm in this sample  Histologic grade of invasive carcinoma: Grade 2                        Glandular/tubular differentiation score: 3                       Nuclear pleomorphism score: 2                       Mitotic rate score: 1                       Total score: 6  Ductal carcinoma in situ: Present, intermediate grade  Lymphovascular invasion: Not identified   B. LYMPH NODE, RIGHT AXILLARY; ULTRASOUND-GUIDED CORE NEEDLE BIOPSY:  - 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 IN GREATEST  EXTENT.   Comment:  The malignancy in the primary breast lesion appears morphologically  different from tumor in the lymph node sampling, with tumor present in  lymph node more suggestive of a histologic grade 3, with significantly  increased mitotic activity and pleomorphism. Due to the discrepancy in  these morphologic patterns, ER/PR/HER2 immunohistochemistry will be  performed on both blocks A1 and B1, with reflex to FISH for HER2 2+. The  results will be reported in an addendum.   ADDENDUM:  CASE SUMMARY: BREAST BIOMARKER TESTS - A - BREAST, RIGHT AT 10:00  Estrogen Receptor (ER) Status: POSITIVE          Percentage of cells with nuclear positivity: 71-80%          Average intensity of staining: Strong   Progesterone  Receptor (PgR) Status: POSITIVE          Percentage of cells with nuclear positivity: 81-90%          Average intensity of staining: Strong   HER2 (by immunohistochemistry): POSITIVE (Score 3+)       Percentage of cells with uniform intense complete membrane  staining: 61-70%  HER2 displays a heterogeneous staining pattern, with areas showing a  strong 3+ staining pattern, and other regions with complete absence (0+)  of staining.   Ki-67: Not performed   CASE SUMMARY: BREAST BIOMARKER TESTS - B - RIGHT AXILLARY LYMPH NODE  Estrogen Receptor (ER) Status: POSITIVE          Percentage of cells with nuclear positivity: 81-90%          Average intensity of staining: Strong   Progesterone Receptor (PgR) Status: POSITIVE          Percentage of  cells with nuclear positivity: 51-60%          Average intensity of staining: Strong   HER2 (by immunohistochemistry): NEGATIVE (Score 1+)  Ki-67: Not performed   #  MAY 2023- STAGE III-T4N1 ER/PR positive HER2 POSITIVE right breast cancer;LN-positive-ER/PR positive however 2 NEGATIVE s/p biopsy [see discussion below].  Discussed with Dr. [name] tumor heterogeneity. BREAST MRI- JUNE 2023- The patient's known malignancy in the upper outer quadrant of the right breast measures 3.8 x 2.8 x 3.6 cm. Numerous other suspicious masses are identified in the right breast as above involving the upper outer and upper inner quadrants, located both anterior and posterior to the known malignancy.  Numerous enhancing foci in the skin as above are worrisome for skin metastases ; Bx proven. Biopsy proven metastatic node in the right axilla.  No evidence of malignancy in the left breast;  Two mildly prominent right internal mammary nodes were not FDG avid on recent PET-CT imaging.  Currently on neoadjuvant chemotherapy- TCH plus P x6 cycles;  MUGA scan May 31st, 2023- -61%.    # June 7th, 2023- NEO-ADJUVANT CHEMO- TCH+P x6 cycles- s/p Mastectomy [NOV 15th, 2023] [Dr.Byrnett]-  ypT2 [39mm]; ypN0.  # JAN mid, 2024 -TAMOXFEN 20 mg a day; ADD LupronFEB 2nd.2 2024  # FEB 2nd, 2023- Kadcyla q 3 W x14 cycles;   # Lupron q3 M [/start 2/07-2022]   # Genetic counseling s/p- NEG for any deleterious mutations.     Carcinoma of upper-outer quadrant of right breast in female, estrogen receptor positive  10/26/2021 Initial Diagnosis   Carcinoma of upper-outer quadrant of right breast in female, estrogen receptor positive (HCC)   10/26/2021 Cancer Staging   Staging form: Breast, AJCC 8th Edition - Clinical: Stage IIIA (cT4d, cN1, cM0, G2, ER+, PR+, HER2+) - Signed by [name], MD on 06/04/2022 Histologic grading system: 3 grade system   11/10/2021 - 03/01/2022 Chemotherapy   Patient is on Treatment Plan :  BREAST  Docetaxel + Carboplatin + Trastuzumab + Pertuzumab  (TCHP) q21d      11/11/2021 - 01/13/2022 Chemotherapy   Patient is on Treatment Plan : BREAST  Docetaxel + Carboplatin + Trastuzumab + Pertuzumab  (TCHP) q21d       Genetic Testing   Negative genetic testing. No pathogenic variants identified on the [facility] CancerNext-Expanded+RNA panel. The report date is 11/29/2021.  The CancerNext-Expanded + RNAinsight gene panel offered by [employer] and includes sequencing and rearrangement analysis for the following 77 genes: IP, ALK, APC*, ATM*, AXIN2, BAP1, BARD1, BLM, BMPR1A, BRCA1*, BRCA2*, BRIP1*, CDC73, CDH1*,CDK4,  CDKN1B, CDKN2A, CHEK2*, CTNNA1, DICER1, FANCC, FH, FLCN, GALNT12, KIF1B, LZTR1, MAX, MEN1, MET, MLH1*, MSH2*, MSH3, MSH6*, MUTYH*, NBN, NF1*, NF2, NTHL1, PALB2*, PHOX2B, PMS2*, POT1, PRKAR1A, PTCH1, PTEN*, RAD51C*, RAD51D*,RB1, RECQL, RET, SDHA, SDHAF2, SDHB, SDHC, SDHD, SMAD4, SMARCA4, SMARCB1, SMARCE1, STK11, SUFU, TMEM127, TP53*,TSC1, TSC2, VHL and XRCC2 (sequencing and deletion/duplication); EGFR, EGLN1, HOXB13, KIT, MITF, PDGFRA, POLD1 and POLE (sequencing only); EPCAM and GREM1 (deletion/duplication only).   07/09/2022 -  Chemotherapy   Patient is on Treatment Plan : BREAST ADO-Trastuzumab Emtansine (Kadcyla) q21d       Interval history- ALMEE PELPHREY presents with complains of allergy like symptoms that started last weekend and have progressively worsened over the weekend. She has sinus pain, pressure, and productive cough. Unable to sleep due to symptoms. Has tried several otc medications without symptoms relief. No fevers or chills. No sick contacts. No chest pain or shortness of breath. Last received kadcyla on 09/10/22.   Review of systems- Review of Systems  Constitutional:  Positive for malaise/fatigue. Negative for chills, diaphoresis, fever and weight loss.  HENT:  Positive for congestion and sinus pain. Negative for ear discharge, ear pain, nosebleeds and sore throat.    Eyes:  Negative for pain and redness.  Respiratory:  Positive for cough and sputum production. Negative for hemoptysis, shortness of breath, wheezing and stridor.   Cardiovascular:  Negative for chest pain, palpitations and leg swelling.  Gastrointestinal:  Negative for abdominal pain, constipation, diarrhea, nausea and vomiting.  Skin:  Negative for rash.  Neurological:  Negative for dizziness, loss of consciousness, weakness and headaches.  Psychiatric/Behavioral:  Negative for depression. The patient is not nervous/anxious and does not have insomnia.   All other systems reviewed and are negative.     Allergies  Allergen Reactions   Metformin Other (See Comments)   Oxycodone Nausea And Vomiting    Past Medical History:  Diagnosis Date   Anemia    Carcinoma of upper-outer quadrant of right breast in female, estrogen receptor positive 10/26/2021   Diabetes mellitus without complication    Family history of adverse reaction to anesthesia    grandmother had issues but doesnt know what the issue was, she was older.   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 Axilla Butterfly hydro clip-MACRO METASTATIC MAMMARY CARCINOMA   BREAST BIOPSY Right 04/21/2022   [place] RT PLC BREAST LOC DEV   1ST LESION  INC US GUIDE 04/21/2022 [facility]   BREAST RECONSTRUCTION WITH PLACEMENT OF TISSUE EXPANDER AND FLEX HD (ACELLULAR HYDRATED DERMIS) Right 04/21/2022   Procedure: RIGHT BREAST RECONSTRUCTION WITH PLACEMENT OF TISSUE EXPANDER AND FLEX HD (ACELLULAR HYDRATED DERMIS);  Surgeon: Peggye Form, DO;  Location: [facility] ORS;  Service: Plastics;  Laterality: Right;   CESAREAN SECTION  2005/2008   DILATION AND CURETTAGE OF UTERUS  2003   PORTACATH  PLACEMENT Left 11/09/2021   Procedure: INSERTION PORT-A-CATH;  Surgeon: Earline Mayotte, MD;  Location: [facility] ORS;  Service: General;  Laterality: Left;   REMOVAL OF TISSUE EXPANDER AND PLACEMENT OF IMPLANT Right 06/08/2022   Procedure: REMOVAL OF TISSUE EXPANDER;  Surgeon: Peggye Form, DO;  Location: [facility];  Service: Plastics;  Laterality: Right;   SIMPLE MASTECTOMY WITH AXILLARY SENTINEL NODE BIOPSY Right 04/21/2022  Procedure: SIMPLE MASTECTOMY WITH AXILLARY SENTINEL NODE BIOPSY;  Surgeon: Earline Mayotte, MD;  Location: [facility] ORS;  Service: General;  Laterality: Right;  wire localization of lymph node   SKIN BIOPSY Right 11/09/2021   Procedure: PUNCH BIOPSY SKIN, TATTOO LYMPH NODE RIGHT AXILLA;  Surgeon: Earline Mayotte, MD;  Location: [facility] ORS;  Service: General;  Laterality: Right;   WISDOM TOOTH EXTRACTION      Social History   Socioeconomic History   Marital status: Married    Spouse name: Fraser Din   Number of children: 2   Years of education: Not on file   Highest education level: Not on file  Occupational History   Occupation: and Architectural technologist  Tobacco Use   Smoking status: Never   Smokeless tobacco: Never  Vaping Use   Vaping Use: Never used  Substance and Sexual Activity   Alcohol use: Never   Drug use: Never   Sexual activity: Yes    Birth control/protection: Other-see comments, Surgical    Comment: vasectomy  Other Topics Concern   Not on file  Social History Narrative   Pre-school teacher; in [place]; 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    Social Determinants of Health   Financial Resource Strain: Not on file  Food Insecurity: Not on file  Transportation Needs: Not on file  Physical Activity: Inactive (09/26/2017)   Exercise Vital Sign    Days of Exercise per Week: 0 days    Minutes of Exercise per Session: 0 min  Stress: Not on file  Social Connections: Not on file  Intimate Partner Violence: Not on file    Family History  Problem Relation Age  of Onset   Ovarian cysts Mother    Migraines Mother    Melanoma Mother        on leg   Hyperlipidemia Father    Diabetes Maternal Aunt    Stomach cancer Paternal Aunt    Diabetes Maternal Grandfather    Lymphoma Cousin 17   Breast cancer Neg Hx      Current Outpatient Medications:    acetaminophen (TYLENOL) 325 MG tablet, Take 2 tablets (650 mg total) by mouth every 6 (six) hours as needed for mild pain (or Fever >/= 101)., Disp: , Rfl:    ascorbic acid (VITAMIN C) 500 MG tablet, Take 1 tablet (500 mg total) by mouth daily., Disp: 30 tablet, Rfl:    calcium-vitamin D (OSCAL WITH D) 500-5 MG-MCG tablet, Take 1 tablet by mouth 2 (two) times daily., Disp: 60 tablet, Rfl: 2   Cyanocobalamin (VITAMIN B-12 PO), Take 1 tablet by mouth daily., Disp: , Rfl:    ferrous sulfate 325 (65 FE) MG tablet, Take 325 mg by mouth 2 (two) times daily with a meal., Disp: , Rfl:    lidocaine-prilocaine (EMLA) cream, Apply on the port. 30 -45 min  prior to port access., Disp: 30 g, Rfl: 3   loratadine (CLARITIN) 10 MG tablet, Take 10 mg by mouth as needed for allergies. Day before tx, day of and day after., Disp: , Rfl:    Multiple Vitamin (MULTIVITAMIN WITH MINERALS) TABS tablet, Take 1 tablet by mouth daily., Disp: , Rfl:    ONETOUCH VERIO test strip, SMARTSIG:1 Each Via Meter Daily PRN, Disp: , Rfl:    potassium chloride (KLOR-CON) 20 MEQ packet, Take 20 mEq by mouth 2 (two) times daily., Disp: 60 packet, Rfl: 3   silver sulfADIAZINE (SILVADENE) 1 % cream, Apply 1 Application topically 2 (two) times daily., Disp: 50 g,  Rfl: 2   tamoxifen (NOLVADEX) 20 MG tablet, TAKE 1 TABLET BY MOUTH EVERY DAY, Disp: 90 tablet, Rfl: 1   Magnesium Cl-Calcium Carbonate (SLOW MAGNESIUM/CALCIUM) 70-117 MG TBEC, Take 1 tablet by mouth 2 (two) times daily. (Patient not taking: Reported on 07/15/2022), Disp: 60 tablet, Rfl: 6   mupirocin ointment (BACTROBAN) 2 %, Apply 1 Application topically 2 (two) times daily. (Patient not taking:  Reported on 09/10/2022), Disp: 30 g, Rfl: 0   ondansetron (ZOFRAN) 4 MG tablet, Take 1 tablet (4 mg total) by mouth every 8 (eight) hours as needed for up to 20 doses for nausea or vomiting. (Patient not taking: Reported on 08/20/2022), Disp: 20 tablet, Rfl: 0   ondansetron (ZOFRAN-ODT) 8 MG disintegrating tablet, Take 8 mg by mouth every 8 (eight) hours as needed for nausea or vomiting. (Patient not taking: Reported on 07/02/2022), Disp: , Rfl:    prochlorperazine (COMPAZINE) 10 MG tablet, Take 1 tablet (10 mg total) by mouth every 6 (six) hours as needed for nausea or vomiting. (Patient not taking: Reported on 07/02/2022), Disp: 40 tablet, Rfl: 1  Physical exam: Exam limited due to telemedicine  There were no vitals filed for this visit. Physical Exam Constitutional:      Appearance: She is not ill-appearing.  HENT:     Nose: Congestion and rhinorrhea present.  Pulmonary:     Effort: No respiratory distress.     Comments: Productive cough.  Neurological:     Mental Status: She is alert and oriented to person, place, and time.  Psychiatric:        Mood and Affect: Mood normal.        Behavior: Behavior normal.        Latest Ref Rng & Units 09/10/2022    8:57 AM  CMP  Glucose 70 - 99 mg/dL 161   BUN 6 - 20 mg/dL 12   Creatinine 0.96 - 1.00 mg/dL 0.45   Sodium 409 - 811 mmol/L 136   Potassium 3.5 - 5.1 mmol/L 3.4   Chloride 98 - 111 mmol/L 104   CO2 22 - 32 mmol/L 27   Calcium 8.9 - 10.3 mg/dL 8.3   Total Protein 6.5 - 8.1 g/dL 6.8   Total Bilirubin 0.3 - 1.2 mg/dL 0.3   Alkaline Phos 38 - 126 U/L 50   AST 15 - 41 U/L 29   ALT 0 - 44 U/L 20       Latest Ref Rng & Units 09/10/2022    8:57 AM  CBC  WBC 4.0 - 10.5 K/uL 4.4   Hemoglobin 12.0 - 15.0 g/dL 91.4   Hematocrit 78.2 - 46.0 % 33.1   Platelets 150 - 400 K/uL 159     No images are attached to the encounter.  No results found.  Assessment and plan- Patient is a 47 y.o. female diagnosed with triple positive breast cancer  currently undergoing adjuvant tamoxifen and kadcyla who presents to symptom management clinic for complaints of cough & sinus pain.    1) Bacterial sinusitis- Recommend antibiotic coverage based on progressively worsening symptoms and  current treatment for breast cancer. I recommended Afrin twice a day for 3 days along with simultaneous Flonase twice a day, with the expectation to continue the Flonase after completing the course of Afrin. This should help to open up the paranasal sinuses, to improve outflow and help resolve the infection. We discussed the pathophysiology of how edema from viruses can cause sinus obstruction and secondary bacterial infection, and  ultimately ventilating the sinuses is critical to improving and overcoming the infection. Start augmentin BID x 7 days. Tessalon 200 mg TID as needed for cough. Tessalon cough syrup for refractory cough. Narcotic precautions reviewed. Don't drive with use, may cause drowsiness. Keep medications secure.   Follow up with Dr Donneta Romberg as scheduled. RTC sooner if symptoms don't improve or worsen in interim.   Visit Diagnosis 1. Acute non-recurrent frontal sinusitis    Patient expressed understanding and was in agreement with this plan. She also understands that She can call clinic at any time with any questions, concerns, or complaints.   I discussed the assessment and treatment plan with the patient. The patient was provided an opportunity to ask questions and all were answered. The patient agreed with the plan and demonstrated an understanding of the instructions.   The patient was advised to call back or seek an in-person evaluation if the symptoms worsen or if the condition fails to improve as anticipated.   I spent 20 minutes face-to-face video visit time dedicated to the care of this patient on the date of this encounter to include pre-visit review of med-onc notes, allergies, previous labs, face-to-face time with the patient, and post  visit ordering of testing/documentation.   Thank you for allowing me to participate in the care of this very pleasant patient.   Consuello Masse, DNP, AGNP-C [facility]  CC: Dr Donneta Romberg

## 2022-10-01 ENCOUNTER — Inpatient Hospital Stay: Payer: 59

## 2022-10-01 ENCOUNTER — Inpatient Hospital Stay (HOSPITAL_BASED_OUTPATIENT_CLINIC_OR_DEPARTMENT_OTHER): Payer: 59 | Admitting: Internal Medicine

## 2022-10-01 ENCOUNTER — Encounter: Payer: Self-pay | Admitting: Internal Medicine

## 2022-10-01 VITALS — BP 124/50 | HR 82 | Temp 97.7°F | Ht 66.0 in | Wt 214.8 lb

## 2022-10-01 DIAGNOSIS — Z170 Estrogen receptor positive status [ER+]: Secondary | ICD-10-CM | POA: Diagnosis not present

## 2022-10-01 DIAGNOSIS — C50411 Malignant neoplasm of upper-outer quadrant of right female breast: Secondary | ICD-10-CM

## 2022-10-01 DIAGNOSIS — Z5112 Encounter for antineoplastic immunotherapy: Secondary | ICD-10-CM | POA: Diagnosis not present

## 2022-10-01 LAB — COMPREHENSIVE METABOLIC PANEL
ALT: 26 U/L (ref 0–44)
AST: 30 U/L (ref 15–41)
Albumin: 3.2 g/dL — ABNORMAL LOW (ref 3.5–5.0)
Alkaline Phosphatase: 52 U/L (ref 38–126)
Anion gap: 7 (ref 5–15)
BUN: 13 mg/dL (ref 6–20)
CO2: 27 mmol/L (ref 22–32)
Calcium: 9 mg/dL (ref 8.9–10.3)
Chloride: 102 mmol/L (ref 98–111)
Creatinine, Ser: 0.63 mg/dL (ref 0.44–1.00)
GFR, Estimated: >60 mL/min (ref >60)
Glucose, Bld: 202 mg/dL — ABNORMAL HIGH (ref 70–99)
Potassium: 3.5 mmol/L (ref 3.5–5.1)
Sodium: 136 mmol/L (ref 135–145)
Total Bilirubin: 0.4 mg/dL (ref 0.3–1.2)
Total Protein: 6.8 g/dL (ref 6.5–8.1)

## 2022-10-01 LAB — CBC WITH DIFFERENTIAL/PLATELET
Abs Immature Granulocytes: 0.02 10*3/uL (ref 0.00–0.07)
Basophils Absolute: 0.1 10*3/uL (ref 0.0–0.1)
Basophils Relative: 1 %
Eosinophils Absolute: 0.2 10*3/uL (ref 0.0–0.5)
Eosinophils Relative: 4 %
HCT: 33.5 % — ABNORMAL LOW (ref 36.0–46.0)
Hemoglobin: 11.1 g/dL — ABNORMAL LOW (ref 12.0–15.0)
Immature Granulocytes: 0 %
Lymphocytes Relative: 21 %
Lymphs Abs: 1.1 10*3/uL (ref 0.7–4.0)
MCH: 28.2 pg (ref 26.0–34.0)
MCHC: 33.1 g/dL (ref 30.0–36.0)
MCV: 85.2 fL (ref 80.0–100.0)
Monocytes Absolute: 0.3 10*3/uL (ref 0.1–1.0)
Monocytes Relative: 7 %
Neutro Abs: 3.3 10*3/uL (ref 1.7–7.7)
Neutrophils Relative %: 67 %
Platelets: 166 10*3/uL (ref 150–400)
RBC: 3.93 MIL/uL (ref 3.87–5.11)
RDW: 14.1 % (ref 11.5–15.5)
WBC: 4.9 10*3/uL (ref 4.0–10.5)
nRBC: 0 % (ref 0.0–0.2)

## 2022-10-01 LAB — MAGNESIUM: Magnesium: 1.8 mg/dL (ref 1.7–2.4)

## 2022-10-01 LAB — PREGNANCY, URINE: Preg Test, Ur: NEGATIVE

## 2022-10-01 MED ORDER — PROCHLORPERAZINE MALEATE 10 MG PO TABS
10.0000 mg | ORAL_TABLET | Freq: Once | ORAL | Status: AC
  Administered 2022-10-01: 10 mg via ORAL
  Filled 2022-10-01: qty 1

## 2022-10-01 MED ORDER — ACETAMINOPHEN 325 MG PO TABS
650.0000 mg | ORAL_TABLET | Freq: Once | ORAL | Status: AC
  Administered 2022-10-01: 650 mg via ORAL
  Filled 2022-10-01: qty 2

## 2022-10-01 MED ORDER — SODIUM CHLORIDE 0.9 % IV SOLN
3.6000 mg/kg | Freq: Once | INTRAVENOUS | Status: AC
  Administered 2022-10-01: 360 mg via INTRAVENOUS
  Filled 2022-10-01: qty 8

## 2022-10-01 MED ORDER — SODIUM CHLORIDE 0.9% FLUSH
10.0000 mL | Freq: Once | INTRAVENOUS | Status: AC
  Administered 2022-10-01: 10 mL via INTRAVENOUS
  Filled 2022-10-01: qty 10

## 2022-10-01 MED ORDER — HEPARIN SOD (PORK) LOCK FLUSH 100 UNIT/ML IV SOLN
500.0000 [IU] | Freq: Once | INTRAVENOUS | Status: AC | PRN
  Administered 2022-10-01: 500 [IU]
  Filled 2022-10-01: qty 5

## 2022-10-01 MED ORDER — SODIUM CHLORIDE 0.9 % IV SOLN
Freq: Once | INTRAVENOUS | Status: AC
  Filled 2022-10-01: qty 250

## 2022-10-01 MED ORDER — DIPHENHYDRAMINE HCL 25 MG PO CAPS
25.0000 mg | ORAL_CAPSULE | Freq: Once | ORAL | Status: AC
  Administered 2022-10-01: 25 mg via ORAL
  Filled 2022-10-01: qty 1

## 2022-10-01 MED ORDER — SODIUM CHLORIDE 0.9% FLUSH
10.0000 mL | INTRAVENOUS | Status: DC | PRN
  Administered 2022-10-01: 10 mL
  Filled 2022-10-01: qty 10

## 2022-10-01 NOTE — Progress Notes (Signed)
C/o vit D level.

## 2022-10-01 NOTE — Patient Instructions (Signed)
[facility] [place] [facility]  Discharge Instructions: Thank you for choosing [facility] to provide your oncology and hematology care.  If you have a lab appointment with the Cancer Center, please go directly to the Cancer Center and check in at the registration area.  Wear comfortable clothing and clothing appropriate for easy access to any Portacath or PICC line.   We strive to give you quality time with your provider. You may need to reschedule your appointment if you arrive late (15 or more minutes).  Arriving late affects you and other patients whose appointments are after yours.  Also, if you miss three or more appointments without notifying the office, you may be dismissed from the clinic at the provider's discretion.      For prescription refill requests, have your pharmacy contact our office and allow 72 hours for refills to be completed.    Today you received the following chemotherapy and/or immunotherapy agents- kadcyla      To help prevent nausea and vomiting after your treatment, we encourage you to take your nausea medication as directed.  BELOW ARE SYMPTOMS THAT SHOULD BE REPORTED IMMEDIATELY: *FEVER GREATER THAN 100.4 F (38 C) OR HIGHER *CHILLS OR SWEATING *NAUSEA AND VOMITING THAT IS NOT CONTROLLED WITH YOUR NAUSEA MEDICATION *UNUSUAL SHORTNESS OF BREATH *UNUSUAL BRUISING OR BLEEDING *URINARY PROBLEMS (pain or burning when urinating, or frequent urination) *BOWEL PROBLEMS (unusual diarrhea, constipation, pain near the anus) TENDERNESS IN MOUTH AND THROAT WITH OR WITHOUT PRESENCE OF ULCERS (sore throat, sores in mouth, or a toothache) UNUSUAL RASH, SWELLING OR PAIN  UNUSUAL VAGINAL DISCHARGE OR ITCHING   Items with * indicate a potential emergency and should be followed up as soon as possible or go to the Emergency Department if any problems should occur.  Please show the CHEMOTHERAPY ALERT CARD or IMMUNOTHERAPY ALERT CARD at check-in to  the Emergency Department and triage nurse.  Should you have questions after your visit or need to cancel or reschedule your appointment, please contact [facility] [place] [facility]  [phone] and follow the prompts.  Office hours are 8:00 a.m. to 4:30 p.m. Monday - Friday. Please note that voicemails left after 4:00 p.m. may not be returned until the following business day.  We are closed weekends and major holidays. You have access to a nurse at all times for urgent questions. Please call the main number to the clinic [phone] and follow the prompts.  For any non-urgent questions, you may also contact your provider using MyChart. We now offer e-Visits for anyone 18 and older to request care online for non-urgent symptoms. For details visit [email or web address].   Also download the MyChart app! Go to the app store, search "MyChart", open the app, select [place], and log in with your MyChart username and password.    

## 2022-10-01 NOTE — Assessment & Plan Note (Addendum)
#   MAY 2023- STAGE III-T4N1 ER/PR positive HER2 POSITIVE right breast cancer; LN-positive-ER/PR positive her-2 NEGATIVE; # Currently s/p neoadjuvant chemotherapy- TCH plus P x 6 cycles- s/p Riht mastectomy-[15th, NOv 2023] ypT2 [62mm]; ypN0.  Given partial response/involvement of the breast skin/multifocal disease-s/p evaluation with Dr. Aggie Cosier.  Awaiting to start radiation; planned sim on 1/29.  Patient currently on tamoxifen [DEC-JAN 2024]; + OFS- Lupron every 3 months ]. ON Ado-trastuzumab emtansine  for 14 cycles. RT s/p 08/27/2022.   # proceed with Ado-trastuzumab emtansine #5  of planned 14 cycles.  MUGA- JAN 2024- Normal LEFT ventricular ejection fraction of 57.1%.  Will repeat the scan after finishing chemotherapy. Stable.   # Allergic reaction to Ado-trastuzumab emtansine-fever/rash.  Improved with Claritin. patient currently on Claritin x3 days postchemotherapy; Tylenol preemptively.  Decrease Benadryl 25 mg given drowsiness. Stable.   # Hypocalcemia- Continue calcium 1200 plus vitamin D-3 1000-over-the-counter-1 pill a day.  Discussed the role of Zometa every 6 months for 3 years/on adjuvant basis.   Continue ca+Vit D BID. Vit D FEB 2024- 54; HOLD zometa today- slightly low ca [albumin 3.0]  # Diabetes- BG 200 [postbar]- s/p  with PCP re: re-adjustments of medications. [OFF ozempic/metformin- intolerance]   # Radiation dermatitis- G-1- stable  # Risk of lymphedema- s/p  Maureen evaluation. Awaiting re-evaluation-.   # Hypokalemia: continue Kdur BID.   # PV access: port functional  Lupron 07/09/2022 #1 q 89M; Zometa q 6 M- not given  # DISPOSITION: # Kadcyla today;  # follow up in 3 week- MD;  port- labs; cbc/cmp; Kadcyla; zometa; Lupron--Dr.B

## 2022-10-01 NOTE — Progress Notes (Signed)
[name] NOTE  Patient Care Team: Latanya Maudlin, NP as PCP - General (Family Medicine) Daiva Huge, RN as Oncology Nurse Navigator Cammie Sickle, [name]  CHIEF COMPLAINTS/PURPOSE OF CONSULTATION: Breast cancer  Oncology History Overview Note  MAY 2023-    Targeted ultrasound is performed, showing a 2.9 x 1.9 x 2.0 cm irregular hypoechoic mass right breast 10 o'clock position 6 cm from nipple at the site of palpable concern.   There is an abnormal 8 mm thickened right axillary lymph node.   There is skin thickening overlying the right breast. ------------------  A. BREAST, RIGHT AT 10:00, 6 CM FROM THE NIPPLE; ULTRASOUND-GUIDED CORE  NEEDLE BIOPSY:  - INVASIVE MAMMARY CARCINOMA, NO SPECIAL TYPE.   Size of invasive carcinoma: 11 mm in this sample  Histologic grade of invasive carcinoma: Grade 2                       Glandular/tubular differentiation score: 3                       Nuclear pleomorphism score: 2                       Mitotic rate score: 1                       Total score: 6  Ductal carcinoma in situ: Present, intermediate grade  Lymphovascular invasion: Not identified   B. LYMPH NODE, RIGHT AXILLARY; ULTRASOUND-GUIDED CORE NEEDLE BIOPSY:  - 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 IN GREATEST  EXTENT.   Comment:  The malignancy in the primary breast lesion appears morphologically  different from tumor in the lymph node sampling, with tumor present in  lymph node more suggestive of a histologic grade 3, with significantly  increased mitotic activity and pleomorphism. Due to the discrepancy in  these morphologic patterns, ER/PR/HER2 immunohistochemistry will be  performed on both blocks A1 and B1, with reflex to [place] for HER2 2+. The  results will be reported in an addendum.   ADDENDUM:  CASE SUMMARY: BREAST BIOMARKER TESTS - A - BREAST, RIGHT AT 10:00  Estrogen Receptor  (ER) Status: POSITIVE          Percentage of cells with nuclear positivity: 71-80%          Average intensity of staining: Strong   Progesterone Receptor (PgR) Status: POSITIVE          Percentage of cells with nuclear positivity: 81-90%          Average intensity of staining: Strong   HER2 (by immunohistochemistry): POSITIVE (Score 3+)       Percentage of cells with uniform intense complete membrane  staining: 61-70%  HER2 displays a heterogeneous staining pattern, with areas showing a  strong 3+ staining pattern, and other regions with complete absence (0+)  of staining.   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  Discussed with Dr. [name] tumor heterogeneity. BREAST MRI- JUNE 2023- The patient's known malignancy in the upper outer quadrant of the right breast measures 3.8 x 2.8 x 3.6 cm. Numerous other suspicious masses are identified in the right breast as above involving the upper outer and upper inner quadrants, located both anterior and posterior to the known malignancy.  Numerous enhancing foci in the skin as above are worrisome for skin metastases ; Bx proven. Biopsy proven metastatic node in the right axilla.  No evidence of malignancy in the left breast;  Two mildly prominent right internal mammary nodes were not FDG avid on recent PET-CT imaging.  Currently on neoadjuvant chemotherapy- TCH  plus P x6 cycles;  MUGA scan May 31st, 2023- -61%.    # June 7th, 2023- NEO-ADJUVANT CHEMO- TCH+P x6 cycles- s/p Mastectomy [NOV 15th, 2023] [Dr.Byrnett]-  ypT2 [27mm]; ypN0.  # JAN mid, 2024 -TAMOXFEN 20 mg a day; ADD LupronFEB 2nd.2 2024  # FEB 2nd, 2023- Kadcyla q 3 W x14 cycles;   # Lupron q3 M [/start 2/07-2022]   # Genetic counseling s/p- NEG for any deleterious mutations.     Carcinoma of upper-outer quadrant of right breast in female, estrogen receptor positive (HCC)  10/26/2021 Initial Diagnosis   Carcinoma of upper-outer quadrant of right breast in female, estrogen receptor positive (HCC)   10/26/2021 Cancer Staging   Staging form: Breast, AJCC 8th Edition - Clinical: Stage IIIA (cT4d, cN1, cM0, G2, ER+, PR+, HER2+) - Signed by [name], MD on 06/04/2022 Histologic grading system: 3 grade system   11/10/2021 - 03/01/2022 Chemotherapy   Patient is on Treatment Plan : BREAST  Docetaxel + Carboplatin + Trastuzumab + Pertuzumab  (TCHP) q21d      11/11/2021 - 01/13/2022 Chemotherapy   Patient is on Treatment Plan : BREAST  Docetaxel + Carboplatin + Trastuzumab + Pertuzumab  (TCHP) q21d       Genetic Testing   Negative genetic testing. No pathogenic variants identified on the [facility] CancerNext-Expanded+RNA panel. The report date is 11/29/2021.  The CancerNext-Expanded + RNAinsight gene panel offered by [employer] and includes sequencing and rearrangement analysis for the following 77 genes: IP, ALK, APC*, ATM*, AXIN2, BAP1, BARD1, BLM, BMPR1A, BRCA1*, BRCA2*, BRIP1*, CDC73, CDH1*,CDK4, CDKN1B, CDKN2A, CHEK2*, CTNNA1, DICER1, FANCC, FH, FLCN, GALNT12, KIF1B, LZTR1, MAX, MEN1, MET, MLH1*, MSH2*, MSH3, MSH6*, MUTYH*, NBN, NF1*, NF2, NTHL1, PALB2*, PHOX2B, PMS2*, POT1, PRKAR1A, PTCH1, PTEN*, RAD51C*, RAD51D*,RB1, RECQL, RET, SDHA, SDHAF2, SDHB, SDHC, SDHD, SMAD4, SMARCA4, SMARCB1, SMARCE1, STK11, SUFU, TMEM127, TP53*,TSC1, TSC2, VHL and XRCC2 (sequencing and deletion/duplication);  EGFR, EGLN1, HOXB13, KIT, MITF, PDGFRA, POLD1 and POLE (sequencing only); EPCAM and GREM1 (deletion/duplication only).   07/09/2022 -  Chemotherapy   Patient is on Treatment Plan : BREAST ADO-Trastuzumab Emtansine (Kadcyla) q21d      HISTORY OF PRESENTING ILLNESS: Patient is ambulating independently.  Accompanied by her husband.   Marilyn Page 47 y.o.  female right breast TRIPLE POSITIVE with T4- Stage III -s/p mastectomy-with expanders-currently on adjuvant kadcyla / tamoxifen + OFS  is here for follow-up.    In the interim patient was evaluated by symptomatic clinic for-sinusitis like symptoms.  S/p antibiotics; symptomatic care...  Patient had no further fever and rash after her last kadcyla  infusion.  Continues to take Claritin; and NSAIDs.   Patient oral Potassium.  improvement in neuropathy and fingernail health.   Review of Systems  Constitutional:  Positive for malaise/fatigue. Negative for chills, diaphoresis, fever and weight loss.  HENT:  Negative for nosebleeds and sore throat.   Eyes:  Negative for double vision.  Respiratory:  Negative for cough, hemoptysis, sputum production, shortness of breath and wheezing.   Cardiovascular:  Negative for chest pain, palpitations, orthopnea and leg swelling.  Gastrointestinal:  Negative for abdominal pain, blood in stool, constipation, diarrhea, heartburn, melena, nausea and vomiting.  Genitourinary:  Negative for dysuria, frequency and urgency.  Musculoskeletal:  Positive for myalgias. Negative for back pain and joint pain.  Skin: Negative.  Negative for itching and rash.  Neurological:  Negative for dizziness, tingling, focal weakness, weakness and headaches.  Endo/Heme/Allergies:  Does not bruise/bleed easily.  Psychiatric/Behavioral:  Negative for depression. The patient is not nervous/anxious and does not have insomnia.      MEDICAL HISTORY:  Past Medical History:  Diagnosis Date   Anemia    Carcinoma of upper-outer quadrant  of right breast in female, estrogen receptor positive (HCC) 10/26/2021   Diabetes mellitus without complication (HCC)    Family history of adverse reaction to anesthesia    grandmother had issues but doesnt know what the issue was, she was older.   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 Axilla Butterfly hydro clip-MACRO METASTATIC MAMMARY CARCINOMA   BREAST BIOPSY Right 04/21/2022   [place] RT PLC BREAST LOC DEV   1ST LESION  INC US GUIDE 04/21/2022 [facility]   BREAST RECONSTRUCTION WITH PLACEMENT OF TISSUE EXPANDER AND FLEX HD (ACELLULAR HYDRATED DERMIS) Right 04/21/2022   Procedure: RIGHT BREAST RECONSTRUCTION WITH PLACEMENT OF TISSUE EXPANDER AND FLEX HD (ACELLULAR HYDRATED DERMIS);  Surgeon: Peggye Form, DO;  Location: [facility] ORS;  Service: Plastics;  Laterality: Right;   CESAREAN SECTION  2005/2008   DILATION AND CURETTAGE OF UTERUS  2003   PORTACATH PLACEMENT Left 11/09/2021   Procedure: INSERTION PORT-A-CATH;  Surgeon: Earline Mayotte, MD;  Location: [facility];  Service: General;  Laterality: Left;   REMOVAL OF TISSUE EXPANDER AND PLACEMENT OF IMPLANT Right 06/08/2022   Procedure: REMOVAL OF TISSUE EXPANDER;  Surgeon: Peggye Form, DO;  Location: [facility];  Service: Plastics;  Laterality: Right;   SIMPLE MASTECTOMY WITH AXILLARY SENTINEL NODE BIOPSY Right 04/21/2022   Procedure: SIMPLE MASTECTOMY WITH AXILLARY SENTINEL NODE BIOPSY;  Surgeon: Earline Mayotte, MD;  Location: [facility];  Service: General;  Laterality: Right;  wire localization of lymph node   SKIN BIOPSY Right 11/09/2021   Procedure: PUNCH BIOPSY SKIN, TATTOO LYMPH NODE RIGHT AXILLA;  Surgeon: Earline Mayotte, MD;  Location: [facility];   Service: General;  Laterality: Right;   WISDOM TOOTH EXTRACTION      SOCIAL HISTORY: Social History   Socioeconomic History   Marital status: Married    Spouse name: Fraser Din   Number of children: 2   Years of education: Not on file   Highest education level: Not on file  Occupational History   Occupation: and Architectural technologist  Tobacco Use   Smoking status: Never   Smokeless tobacco: Never  Vaping Use   Vaping Use: Never used  Substance and Sexual Activity   Alcohol use: Never   Drug use: Never   Sexual activity: Yes    Birth control/protection: Other-see comments, Surgical    Comment: vasectomy  Other Topics Concern   Not on file  Social History Narrative   Pre-school teacher; in [place]; 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    Social Determinants of Health   Financial Resource Strain: Not on file  Food Insecurity: Not on file  Transportation Needs: Not on file  Physical Activity: Inactive (09/26/2017)   Exercise Vital Sign    Days of Exercise per Week: 0 days    Minutes of Exercise per Session: 0 min  Stress: Not on file  Social Connections: Not on file  Intimate Partner Violence: Not on file    FAMILY HISTORY: Family History  Problem Relation Age of Onset   Ovarian cysts Mother    Migraines Mother    Melanoma Mother        on leg   Hyperlipidemia Father    Diabetes Maternal Aunt    Stomach cancer Paternal Aunt    Diabetes Maternal Grandfather    Lymphoma Cousin 17   Breast cancer Neg Hx     ALLERGIES:  is allergic to metformin and oxycodone.  MEDICATIONS:  Current Outpatient Medications  Medication Sig Dispense Refill   acetaminophen (TYLENOL) 325 MG tablet Take 2 tablets (650 mg total) by mouth every 6 (six) hours as needed for mild pain (or Fever >/= 101).     ascorbic acid (VITAMIN C) 500 MG tablet Take 1 tablet (500 mg total) by mouth daily. 30 tablet    benzonatate (TESSALON) 200 MG capsule Take 1 capsule (200 mg total) by mouth 3 (three) times daily as  needed for cough. 60 capsule 0   calcium-vitamin D (OSCAL WITH D) 500-5 MG-MCG tablet Take 1 tablet by mouth 2 (two) times daily. 60 tablet 2   chlorpheniramine-HYDROcodone (TUSSIONEX) 10-8 MG/5ML Take 5 mLs by mouth at bedtime as needed for cough. May cause drowsiness. 140 mL 0   Cyanocobalamin (VITAMIN B-12 PO) Take 1 tablet by mouth daily.     ferrous sulfate 325 (65 FE) MG tablet Take 325 mg by mouth 2 (two) times daily with a meal.     lidocaine-prilocaine (EMLA) cream Apply on the port. 30 -45 min  prior to port access. 30 g 3   loratadine (CLARITIN) 10 MG tablet Take 10 mg by mouth as needed for allergies. Day before tx, day of and day after.     Magnesium Cl-Calcium Carbonate (SLOW MAGNESIUM/CALCIUM) 70-117 MG TBEC Take 1 tablet by mouth 2 (two) times daily. 60 tablet 6   Multiple Vitamin (MULTIVITAMIN WITH MINERALS) TABS tablet Take 1 tablet by mouth daily.     ondansetron (ZOFRAN) 4 MG tablet Take 1 tablet (4 mg total) by mouth every 8 (eight) hours as needed for up to 20 doses for nausea or vomiting. 20 tablet 0   ONETOUCH VERIO test strip SMARTSIG:1 Each Via Meter Daily PRN     potassium chloride (KLOR-CON) 20 MEQ packet Take 20 mEq by mouth 2 (two) times daily. 60 packet 3   silver sulfADIAZINE (SILVADENE) 1 % cream Apply 1 Application topically 2 (two) times daily. 50 g 2   tamoxifen (NOLVADEX) 20 MG tablet TAKE 1 TABLET BY MOUTH EVERY DAY 90 tablet 1   ondansetron (ZOFRAN-ODT) 8 MG disintegrating tablet Take 8 mg by mouth every 8 (eight) hours as  needed for nausea or vomiting. (Patient not taking: Reported on 07/02/2022)     prochlorperazine (COMPAZINE) 10 MG tablet Take 1 tablet (10 mg total) by mouth every 6 (six) hours as needed for nausea or vomiting. (Patient not taking: Reported on 07/02/2022) 40 tablet 1   No current facility-administered medications for this visit.   Facility-Administered Medications Ordered in Other Visits  Medication Dose Route Frequency Provider Last Rate  Last Admin   0.9 %  sodium chloride infusion   Intravenous Once [name], MD       acetaminophen (TYLENOL) tablet 650 mg  650 mg Oral Once [name], MD       ado-trastuzumab emtansine (KADCYLA) 360 mg in sodium chloride 0.9 % 250 mL chemo infusion  3.6 mg/kg (Treatment Plan Recorded) Intravenous Once [name], MD       diphenhydrAMINE (BENADRYL) capsule 25 mg  25 mg Oral Once Louretta Shorten R, MD       heparin lock flush 100 unit/mL  500 Units Intracatheter Once PRN [name], MD       prochlorperazine (COMPAZINE) tablet 10 mg  10 mg Oral Once Louretta Shorten R, MD       sodium chloride flush (NS) 0.9 % injection 10 mL  10 mL Intracatheter PRN [name], MD          .  PHYSICAL EXAMINATION: ECOG PERFORMANCE STATUS: 0 - Asymptomatic  Vitals:   10/01/22 1028  BP: (!) 124/50  Pulse: 82  Temp: 97.7 F (36.5 C)  SpO2: 99%      Filed Weights   10/01/22 1028  Weight: 214 lb 12.8 oz (97.4 kg)     Physical Exam Vitals and nursing note reviewed.  HENT:     Head: Normocephalic and atraumatic.     Mouth/Throat:     Pharynx: Oropharynx is clear.  Eyes:     Extraocular Movements: Extraocular movements intact.     Pupils: Pupils are equal, round, and reactive to light.  Cardiovascular:     Rate and Rhythm: Normal rate and regular rhythm.  Pulmonary:     Comments: Decreased breath sounds bilaterally.  Abdominal:     Palpations: Abdomen is soft.  Musculoskeletal:        General: Normal range of motion.     Cervical back: Normal range of motion.  Skin:    General: Skin is warm.  Neurological:     General: No focal deficit present.     Mental Status: She is alert and oriented to person, place, and time.  Psychiatric:        Behavior: Behavior normal.        Judgment: Judgment normal.      LABORATORY DATA:  I have reviewed the data as listed Lab Results  Component Value Date   WBC 4.9 10/01/2022    HGB 11.1 (L) 10/01/2022   HCT 33.5 (L) 10/01/2022   MCV 85.2 10/01/2022   PLT 166 10/01/2022   Recent Labs    12/27/21 2105 12/28/21 0329 08/20/22 0821 09/10/22 0857 10/01/22 1015  NA 134*   < > 138 136 136  K 3.6   < > 3.2* 3.4* 3.5  CL 104   < > 103 104 102  CO2 22   < > 27 27 27   GLUCOSE 192*   < > 228* 200* 202*  BUN 16   < > 12 12 13   CREATININE 0.93   < > 0.76 0.69 0.63  CALCIUM 8.6*   < > 8.6* 8.3* 9.0  GFRNONAA >60   < > >60 >60 >60  PROT 7.0   < > 6.6 6.8 6.8  ALBUMIN 3.7   < > 3.0* 3.0* 3.2*  AST 42*   < > 28 29 30   ALT 51*   < > 20 20 26   ALKPHOS 70   < > 47 50 52  BILITOT 1.1   < > 0.6 0.3 0.4  BILIDIR 0.2  --   --   --   --   IBILI 0.9  --   --   --   --    < > = values in this interval not displayed.    RADIOGRAPHIC STUDIES: I have personally reviewed the radiological images as listed and agreed with the findings in the report. No results found.  ASSESSMENT & PLAN:   Carcinoma of upper-outer quadrant of right breast in female, estrogen receptor positive (HCC) # MAY 2023- STAGE III-T4N1 ER/PR positive HER2 POSITIVE right breast cancer; LN-positive-ER/PR positive her-2 NEGATIVE; # Currently s/p neoadjuvant chemotherapy- TCH plus P x 6 cycles- s/p Riht mastectomy-[15th, NOv 2023] ypT2 [18mm]; ypN0.  Given partial response/involvement of the breast skin/multifocal disease-s/p evaluation with Dr. Aggie Cosier.  Awaiting to start radiation; planned sim on 1/29.  Patient currently on tamoxifen [DEC-JAN 2024]; + OFS- Lupron every 3 months ]. ON Ado-trastuzumab emtansine  for 14 cycles. RT s/p 08/27/2022.   # proceed with Ado-trastuzumab emtansine #5  of planned 14 cycles.  MUGA- JAN 2024- Normal LEFT ventricular ejection fraction of 57.1%.  Will repeat the scan after finishing chemotherapy. Stable.   # Allergic reaction to Ado-trastuzumab emtansine-fever/rash.  Improved with Claritin. patient currently on Claritin x3 days postchemotherapy; Tylenol preemptively.  Decrease  Benadryl 25 mg given drowsiness. Stable.   # Hypocalcemia- Continue calcium 1200 plus vitamin D-3 1000-over-the-counter-1 pill a day.  Discussed the role of Zometa every 6 months for 3 years/on adjuvant basis.   Continue ca+Vit D BID. Vit D FEB 2024- 54; HOLD zometa today- slightly low ca [albumin 3.0]  # Diabetes- BG 200 [postbar]- s/p  with PCP re: re-adjustments of medications. [OFF ozempic/metformin- intolerance]   # Radiation dermatitis- G-1- stable  # Risk of lymphedema- s/p  Maureen evaluation. Awaiting re-evaluation-.   # Hypokalemia: continue Kdur BID.   # PV access: port functional  Lupron 07/09/2022 #1 q 48M; Zometa q 6 M- not given  # DISPOSITION: # Kadcyla today;  # follow up in 3 week- MD;  port- labs; cbc/cmp; Kadcyla; zometa; Lupron--Dr.B  All questions were answered. The patient/family knows to call the clinic with any problems, questions or concerns.     [name], MD 10/01/2022 11:36 AM

## 2022-10-04 ENCOUNTER — Encounter: Payer: Self-pay | Admitting: Radiation Oncology

## 2022-10-04 ENCOUNTER — Ambulatory Visit
Admission: RE | Admit: 2022-10-04 | Discharge: 2022-10-04 | Disposition: A | Discharge status: Home / Self Care | Payer: 59 | Source: Ambulatory Visit | Attending: Radiation Oncology | Admitting: Radiation Oncology

## 2022-10-04 VITALS — BP 118/75 | HR 97 | Temp 98.0°F | Resp 16 | Ht 66.0 in | Wt 214.5 lb

## 2022-10-04 DIAGNOSIS — C50411 Malignant neoplasm of upper-outer quadrant of right female breast: Secondary | ICD-10-CM | POA: Diagnosis present

## 2022-10-04 DIAGNOSIS — Z923 Personal history of irradiation: Secondary | ICD-10-CM | POA: Diagnosis not present

## 2022-10-04 DIAGNOSIS — Z79810 Long term (current) use of selective estrogen receptor modulators (SERMs): Secondary | ICD-10-CM | POA: Insufficient documentation

## 2022-10-04 DIAGNOSIS — Z170 Estrogen receptor positive status [ER+]: Secondary | ICD-10-CM | POA: Diagnosis not present

## 2022-10-04 NOTE — Progress Notes (Signed)
Radiation Oncology Follow up Note  Name: Marilyn Page   Date:   10/04/2022 MRN:  [id number] DOB: 10-25-1975    This 47 y.o. female presents to the clinic today for 1 month follow-up status post radiation therapy to her right chest wall for stage IIa triple positive invasive mammary carcinoma status post neoadjuvant chemotherapy and modified radical mastectomy.  REFERRING PROVIDER: [name], NP  HPI: Patient is a 47 year old female now out 1 month having completed radiation therapy to her right chest wall.  And peripheral lymphatics for a triple positive invasive mammary carcinoma poor prognostic features.  Seen today in routine follow-up she is doing well.  She specifically denies any new chest wall masses or nodularity or any swelling in her right upper extremity.  She has been started on tamoxifen.  Patient is also on Kadcyla.  She is tolerating those well.  COMPLICATIONS OF TREATMENT: none  FOLLOW UP COMPLIANCE: keeps appointments   PHYSICAL EXAM:  BP 118/75   Pulse 97   Temp 98 F (36.7 C)   Resp 16   Ht 5\' 6"  (1.676 m)   Wt 214 lb 8 oz (97.3 kg)   BMI 34.62 kg/m  Patient is status post modified radical mastectomy in the right chest wall is well-healed mild hyperpigmentation of the skin is noted.  Well-developed well-nourished patient in NAD. HEENT reveals PERLA, EOMI, discs not visualized.  Oral cavity is clear. No oral mucosal lesions are identified. Neck is clear without evidence of cervical or supraclavicular adenopathy. Lungs are clear to A&P. Cardiac examination is essentially unremarkable with regular rate and rhythm without murmur rub or thrill. Abdomen is benign with no organomegaly or masses noted. Motor sensory and DTR levels are equal and symmetric in the upper and lower extremities. Cranial nerves II through XII are grossly intact. Proprioception is intact. No peripheral adenopathy or edema is identified. No motor or sensory levels are noted. Crude visual fields  are within normal range.  RADIOLOGY RESULTS: No current films for review  PLAN: Patient is now 1 month out from chest wall and peripheral lymphatic radiation doing well.  Please her overall progress.  She continues to use treatment through medical oncology.  I have asked to see her back in 6 months for follow-up.  Patient is to call with any concerns.  I would like to take this opportunity to thank you for allowing me to participate in the care of your patient.Carmina Miller, MD

## 2022-10-05 ENCOUNTER — Other Ambulatory Visit: Payer: Self-pay

## 2022-10-05 ENCOUNTER — Encounter: Payer: Self-pay | Admitting: Internal Medicine

## 2022-10-08 ENCOUNTER — Telehealth: Payer: Self-pay | Admitting: *Deleted

## 2022-10-08 NOTE — Telephone Encounter (Signed)
Received on (10/06/2022) via of fax DME Standard Written Order from [facility] [place].  Requesting sign and return.  Given to provider to sign.  DME Standard Written Order signed by provider and faxed back to [facility].  Confirmation received and copy scanned into the chart.//AB/CMA

## 2022-10-20 ENCOUNTER — Inpatient Hospital Stay: Payer: 59 | Attending: Internal Medicine | Admitting: Occupational Therapy

## 2022-10-20 DIAGNOSIS — M25611 Stiffness of right shoulder, not elsewhere classified: Secondary | ICD-10-CM

## 2022-10-20 DIAGNOSIS — C50411 Malignant neoplasm of upper-outer quadrant of right female breast: Secondary | ICD-10-CM | POA: Insufficient documentation

## 2022-10-20 DIAGNOSIS — Z5112 Encounter for antineoplastic immunotherapy: Secondary | ICD-10-CM | POA: Insufficient documentation

## 2022-10-20 DIAGNOSIS — Z170 Estrogen receptor positive status [ER+]: Secondary | ICD-10-CM | POA: Insufficient documentation

## 2022-10-20 DIAGNOSIS — E876 Hypokalemia: Secondary | ICD-10-CM | POA: Insufficient documentation

## 2022-10-20 NOTE — Therapy (Signed)
[facility] at [facility] [address] [place], [place], [address] Phone: [phone]   Fax:  [phone]  Occupational Therapy Screen  Patient Details  Name: Marilyn Page MRN: [id number] Date of Birth: 1975/07/25 No data recorded  Encounter Date: 10/20/2022   OT End of Session - 10/20/22 1612     Visit Number 0             Past Medical History:  Diagnosis Date   Anemia    Carcinoma of upper-outer quadrant of right breast in female, estrogen receptor positive (HCC) 10/26/2021   Diabetes mellitus without complication (HCC)    Family history of adverse reaction to anesthesia    grandmother had issues but doesnt know what the issue was, she was older.   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 Axilla Butterfly hydro clip-MACRO METASTATIC MAMMARY CARCINOMA   BREAST BIOPSY Right 04/21/2022   [place] RT PLC BREAST LOC DEV   1ST LESION  INC US GUIDE 04/21/2022 [facility]   BREAST RECONSTRUCTION WITH PLACEMENT OF TISSUE EXPANDER AND FLEX HD (ACELLULAR HYDRATED DERMIS) Right 04/21/2022   Procedure: RIGHT BREAST RECONSTRUCTION WITH PLACEMENT OF TISSUE EXPANDER AND FLEX HD (ACELLULAR HYDRATED DERMIS);  Surgeon: Peggye Form, DO;  Location: [facility] ORS;  Service: Plastics;  Laterality: Right;   CESAREAN SECTION  2005/2008   DILATION AND CURETTAGE OF UTERUS  2003   PORTACATH PLACEMENT Left 11/09/2021   Procedure: INSERTION PORT-A-CATH;  Surgeon: Earline Mayotte, MD;  Location: [facility] ORS;  Service: General;  Laterality: Left;   REMOVAL OF TISSUE EXPANDER AND PLACEMENT OF IMPLANT Right 06/08/2022   Procedure: REMOVAL OF TISSUE EXPANDER;  Surgeon: Peggye Form, DO;  [facility];  Service: Plastics;  Laterality: Right;   SIMPLE MASTECTOMY WITH AXILLARY SENTINEL NODE BIOPSY Right 04/21/2022   Procedure: SIMPLE MASTECTOMY WITH AXILLARY SENTINEL NODE BIOPSY;  Surgeon: Earline Mayotte, MD;  Location: [facility] ORS;  Service: General;  Laterality: Right;  wire localization of lymph node   SKIN BIOPSY Right 11/09/2021   Procedure: PUNCH BIOPSY SKIN, TATTOO LYMPH NODE RIGHT AXILLA;  Surgeon: Earline Mayotte, MD;  Location: [facility] ORS;  Service: General;  Laterality: Right;   WISDOM TOOTH EXTRACTION      There were no vitals filed for this visit.   Subjective Assessment - 10/20/22 1610     Subjective  I am doing okay finished radiation about a month ago.  My blisters healed.  Just feels tight when I go overhead in the front of my shoulder under my arm.  Still cannot have chemo until about October.  I did get fitted with bras and prosthesis.  Feels the Bra is cueing me to get my posture and right shoulder back    Currently in Pain? Yes    Pain Score 1     Pain Location Axilla    Pain Orientation Right    Pain Descriptors / Indicators Tightness    Pain Type Surgical pain                 LYMPHEDEMA/ONCOLOGY QUESTIONNAIRE - 10/20/22 0001       Right Upper Extremity Lymphedema   15 cm Proximal to Olecranon  Process 39.5 cm    10 cm Proximal to Olecranon Process 36 cm    Olecranon Process 29.5 cm             OT SCREEN 05/19/22: Patient arrived this date postop right mastectomy on 04/21/2022 with immediate reconstruction expanders.  Patient getting feelings at the moment.  Patient with increased stiffness and discomfort in right shoulder and chest underarm.  Patient still have 1 drain in.  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 Patient is right-hand dominant.  Shoulder flexion is 115.  And abduction 80 degrees.  Pain-free. Patient to maintain about 90 degrees of shoulder flexion and abduction pain-free until seeing  surgeon and getting okay to work on over head but not until okay by surgeon and drain comes out. Patient is scheduled for simulation for radiation on Monday the 18th. Next appointment with plastic surgery is this Friday, 15 December.     OT SCREEN 05/26/22:   Patient arrived this date postop right mastectomy on 04/21/2022 with immediate reconstruction expanders.  Patient cont to have drain in. Follow up tomorrow again with plastic surgery.  Patient showed increase R shoulder AROM since last time but cont to have stiffness and discomfort in right  axilla and chest underarm with shoulder flexion and ABD.  Patient still have 1 drain in.  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 Patient is right-hand dominant.  Shoulder flexion is 115.  And abduction 90 degrees coming in    Reviewed with patient home program again.  This date provided patient with pulleys to work on shoulder active assisted range of motion flexion and abduction 20 reps keeping pain under 4/10. Followed by supine using wand for shoulder flexion and abduction as well as working on light external rotation. Shoulder flexion increased to 135 degrees and abduction to 115 on R shoulder    Patient has radiation simulator next week and will follow-up with me again.     ASSESSMENT & PLAN DR Donneta Romberg 08/20/22:    Carcinoma of upper-outer quadrant of right breast in female, estrogen receptor positive (HCC) # MAY 2023- STAGE III-T4N1 ER/PR positive HER2 POSITIVE right breast cancer; LN-positive-ER/PR positive her-2 NEGATIVE; # Currently s/p neoadjuvant chemotherapy- TCH plus P x 6 cycles- s/p Riht mastectomy-[15th, NOv 2023] ypT2 [2mm]; ypN0.  Given partial response/involvement of the breast skin/multifocal disease-s/p evaluation with Dr. Aggie Cosier.  Awaiting to start radiation; planned sim on 1/29.  Patient currently on tamoxifen [DEC-JAN 2024]; + OFS- Lupron every 3 months ]. ON Ado-trastuzumab emtansine  for 14  cycles. RT until 3/22.    # proceed with Ado-trastuzumab emtansine #3 of planned 14 cycles.  Patient tolerated treatment well except for-rash/fever see below     MUGA scan May 31st, 2023- -61%. JAN 2024- Normal LEFT ventricular ejection fraction of 57.1% .   # Allergic reaction to Ado-trastuzumab emtansine-fever/rash.  Improved with Claritin. patient currently on Claritin x3 days postchemotherapy; Tylenol preemptively.  Decrease Benadryl 25 mg given drowsiness.   # Hypocalcemia- Continue calcium 1200 plus vitamin D-3 1000-over-the-counter-1 pill a day.  Discussed the role of Zometa every 6 months for 3 years/on adjuvant basis.   Continue ca+Vit D BID. Vit D FEB 2024- 54; HOLD zometa today- slightly low ca.    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    # Risk of lymphedema- refer to [facility] again.    # Hypokalemia: K dur 3.2- recommend compliance with BID.    #  PN G-1-2 sec to Taxotere- monitor for now. Stable.    # PV access: port functional   Lupron 07/09/2022 #1 q 1M; Zometa q 6 M   # DISPOSITION: # Kadcyla today; HOLD Zometa # follow up in 3 week- MD;  port- labs; cbc/cmp; Kadcyla- zometa  Dr.B                         OT SCREEN 3/20/2       Pt arrive after being seen in Dec 23 last time - since then had L expander removed  because of infection and radiation she has 3 more sessions left- some blistering in axilla and limiting her shoulder ABD more than flexion on L UE. Pt to keep ABD to about 90 degrees- and flexion in pain free motion.  Cannot do any manaul therapy until 4-6 wks after radiation Pt circumference in bilateral UE compare to WNL  Pt with L shoulder rounder and forward - lower than R - pt to do several times during day scapula retraction and chin tug Pt with tight and tenderness over L upper trap - over use  Pt to do upper trap stretch  Follow up with me again after done with radiation and blisters are healed.         OT SCREEN  10/20/22: Patient arrived being seen 8 weeks ago.  Patient done with radiation.  Active shoulder range of motion increased but still continues to have tightness in right axilla and anterior shoulder over Pect. Reviewed with patient child pose stretches for Lats and  shoulder flexion Reminded patient to continue with her pulleys for shoulder flexion and abduction active assisted range of motion-per patient stopped doing it. 20 reps each Provided patient with red Thera-Band for scapular squeezes as well as shoulder extension 2 sets of 12 2 times a day Continue to focus on posture with retraction of right shoulder. Patient to have a follow-up appointment with Dr. Ulice Bold to decide about reconstruction. Patient to contact me if needed when to follow-up.                                      Visit Diagnosis: Stiffness of right shoulder, not elsewhere classified    Problem List Patient Active Problem List   Diagnosis Date Noted   Acquired absence of right breast 06/18/2022   Low vitamin B12 level 05/24/2022   Hypomagnesemia 05/16/2022   Lactic acid acidosis 03/06/2022   Fever and chills 03/05/2022   COVID-19 virus infection 12/28/2021   Thrombocytopenia (HCC) 12/28/2021   Diabetes mellitus (HCC) 12/28/2021   Febrile neutropenia (HCC) 12/27/2021   Sepsis (HCC) 12/27/2021   Genetic testing 12/01/2021   Carcinoma of upper-outer quadrant of right breast in female, estrogen receptor positive (HCC) 10/26/2021   Uterus, adenomyosis 05/14/2021   ASCUS of cervix with negative high risk HPV 04/23/2021   Iron deficiency anemia due to chronic blood loss 04/23/2021   Type 2 diabetes mellitus without complication, without long-term current use of insulin (HCC) 05/18/2020   RLS (restless legs syndrome) 05/14/2020   Menorrhagia with regular cycle 10/14/2017   BMI 37.0-37.9, adult 10/03/2017   History of gestational diabetes 10/03/2017    Oletta Cohn,  OTR/L,CLT 10/20/2022, 4:12 PM  [facility] [address] [place], [place], [address] Phone: [phone]   Fax:  [phone]  Name: Marilyn Page MRN: [id number] Date of Birth: 01/31/1976

## 2022-10-22 ENCOUNTER — Inpatient Hospital Stay: Payer: 59

## 2022-10-22 ENCOUNTER — Inpatient Hospital Stay (HOSPITAL_BASED_OUTPATIENT_CLINIC_OR_DEPARTMENT_OTHER): Payer: 59 | Admitting: Internal Medicine

## 2022-10-22 ENCOUNTER — Other Ambulatory Visit: Payer: Self-pay

## 2022-10-22 ENCOUNTER — Ambulatory Visit: Payer: 59

## 2022-10-22 VITALS — BP 96/54 | HR 76 | Temp 97.7°F | Ht 66.0 in | Wt 213.6 lb

## 2022-10-22 DIAGNOSIS — D500 Iron deficiency anemia secondary to blood loss (chronic): Secondary | ICD-10-CM

## 2022-10-22 DIAGNOSIS — C50411 Malignant neoplasm of upper-outer quadrant of right female breast: Secondary | ICD-10-CM | POA: Diagnosis present

## 2022-10-22 DIAGNOSIS — Z170 Estrogen receptor positive status [ER+]: Secondary | ICD-10-CM

## 2022-10-22 DIAGNOSIS — Z5112 Encounter for antineoplastic immunotherapy: Secondary | ICD-10-CM | POA: Diagnosis present

## 2022-10-22 DIAGNOSIS — Z9221 Personal history of antineoplastic chemotherapy: Secondary | ICD-10-CM

## 2022-10-22 DIAGNOSIS — E876 Hypokalemia: Secondary | ICD-10-CM | POA: Diagnosis not present

## 2022-10-22 LAB — CBC WITH DIFFERENTIAL/PLATELET
Abs Immature Granulocytes: 0.03 10*3/uL (ref 0.00–0.07)
Basophils Absolute: 0 10*3/uL (ref 0.0–0.1)
Basophils Relative: 1 %
Eosinophils Absolute: 0.2 10*3/uL (ref 0.0–0.5)
Eosinophils Relative: 4 %
HCT: 35 % — ABNORMAL LOW (ref 36.0–46.0)
Hemoglobin: 11.4 g/dL — ABNORMAL LOW (ref 12.0–15.0)
Immature Granulocytes: 1 %
Lymphocytes Relative: 25 %
Lymphs Abs: 1.1 10*3/uL (ref 0.7–4.0)
MCH: 28 pg (ref 26.0–34.0)
MCHC: 32.6 g/dL (ref 30.0–36.0)
MCV: 86 fL (ref 80.0–100.0)
Monocytes Absolute: 0.3 10*3/uL (ref 0.1–1.0)
Monocytes Relative: 7 %
Neutro Abs: 2.8 10*3/uL (ref 1.7–7.7)
Neutrophils Relative %: 62 %
Platelets: 158 10*3/uL (ref 150–400)
RBC: 4.07 MIL/uL (ref 3.87–5.11)
RDW: 14.3 % (ref 11.5–15.5)
WBC: 4.5 10*3/uL (ref 4.0–10.5)
nRBC: 0 % (ref 0.0–0.2)

## 2022-10-22 LAB — COMPREHENSIVE METABOLIC PANEL
ALT: 29 U/L (ref 0–44)
AST: 31 U/L (ref 15–41)
Albumin: 3.2 g/dL — ABNORMAL LOW (ref 3.5–5.0)
Alkaline Phosphatase: 50 U/L (ref 38–126)
Anion gap: 9 (ref 5–15)
BUN: 13 mg/dL (ref 6–20)
CO2: 27 mmol/L (ref 22–32)
Calcium: 9 mg/dL (ref 8.9–10.3)
Chloride: 103 mmol/L (ref 98–111)
Creatinine, Ser: 0.61 mg/dL (ref 0.44–1.00)
GFR, Estimated: >60 mL/min (ref >60)
Glucose, Bld: 176 mg/dL — ABNORMAL HIGH (ref 70–99)
Potassium: 3.7 mmol/L (ref 3.5–5.1)
Sodium: 139 mmol/L (ref 135–145)
Total Bilirubin: 0.4 mg/dL (ref 0.3–1.2)
Total Protein: 6.9 g/dL (ref 6.5–8.1)

## 2022-10-22 MED ORDER — ACETAMINOPHEN 325 MG PO TABS
650.0000 mg | ORAL_TABLET | Freq: Once | ORAL | Status: AC
  Administered 2022-10-22: 650 mg via ORAL
  Filled 2022-10-22: qty 2

## 2022-10-22 MED ORDER — PROCHLORPERAZINE MALEATE 10 MG PO TABS
10.0000 mg | ORAL_TABLET | Freq: Once | ORAL | Status: AC
  Administered 2022-10-22: 10 mg via ORAL
  Filled 2022-10-22: qty 1

## 2022-10-22 MED ORDER — LEUPROLIDE ACETATE (3 MONTH) 11.25 MG IM KIT
11.2500 mg | PACK | Freq: Once | INTRAMUSCULAR | Status: AC
  Administered 2022-10-22: 11.25 mg via INTRAMUSCULAR
  Filled 2022-10-22: qty 11.25

## 2022-10-22 MED ORDER — ZOLEDRONIC ACID 4 MG/100ML IV SOLN
4.0000 mg | Freq: Once | INTRAVENOUS | Status: AC
  Administered 2022-10-22: 4 mg via INTRAVENOUS
  Filled 2022-10-22: qty 100

## 2022-10-22 MED ORDER — SODIUM CHLORIDE 0.9 % IV SOLN
3.6000 mg/kg | Freq: Once | INTRAVENOUS | Status: AC
  Administered 2022-10-22: 360 mg via INTRAVENOUS
  Filled 2022-10-22: qty 8

## 2022-10-22 MED ORDER — DIPHENHYDRAMINE HCL 25 MG PO CAPS
25.0000 mg | ORAL_CAPSULE | Freq: Once | ORAL | Status: AC
  Administered 2022-10-22: 25 mg via ORAL
  Filled 2022-10-22: qty 1

## 2022-10-22 MED ORDER — HEPARIN SOD (PORK) LOCK FLUSH 100 UNIT/ML IV SOLN
500.0000 [IU] | Freq: Once | INTRAVENOUS | Status: AC | PRN
  Administered 2022-10-22: 500 [IU]
  Filled 2022-10-22: qty 5

## 2022-10-22 MED ORDER — SODIUM CHLORIDE 0.9 % IV SOLN
Freq: Once | INTRAVENOUS | Status: AC
  Filled 2022-10-22: qty 250

## 2022-10-22 MED ORDER — SODIUM CHLORIDE 0.9% FLUSH
10.0000 mL | INTRAVENOUS | Status: DC | PRN
  Filled 2022-10-22: qty 10

## 2022-10-22 NOTE — Assessment & Plan Note (Addendum)
#   MAY 2023- STAGE III-T4N1 ER/PR positive HER2 POSITIVE RIGHT breast cancer; LN-positive-ER/PR positive her-2 NEGATIVE; # s/p neoadjuvant chemotherapy- TCH plus P x 6 cycles- s/p Right mastectomy-[15th, NOv 2023] ypT2 [15mm]; ypN0.  Given partial response/involvement of the breast skin/multifocal disease- s/p RT- 08/27/2022. atient currently on tamoxifen [DEC-JAN 2024]; + OFS- Lupron every 3 months ]. ON Ado-trastuzumab emtansine  for 14 cycles.   # proceed with Ado-trastuzumab emtansine #6.  of planned 14 cycles.  MUGA- JAN 2024- Normal LEFT ventricular ejection fraction of 57.1%.  Will repeat the scan after finishing chemotherapy. Stable. Will order left mammogram; MUGA scan   # Allergic reaction to Ado-trastuzumab emtansine-fever/rash- currently on Claritin x3 days postchemotherapy; Tylenol preemptively.  Decreased Benadryl 25 mg given drowsiness. Stable.   # Hypocalcemia- Continue calcium 1200 plus vitamin D-3 1000-over-the-counter-1 pill a day.  Discussed the role of Zometa every 6 months for 3 years/on adjuvant basis.   Continue ca+Vit D BID. Vit D FEB 2024- 54; HOLD zometa today- slightly low ca [albumin 3.0]  # Diabetes- BG 200 [postbar]- currently on Farxiga 5mg - [OFF ozempic/metformin- intolerance]- Stable.  # mild hypotension: check BP at home; bring a  log  # Risk of lymphedema- s/p  Maureen evaluation- s/p re-evaluation.   # Hypokalemia: continue Kdur BID.   # PV access: port functional  Lupron 10/22/2022 - q 56M; Zometa q 6 M-  10/22/22  # DISPOSITION: # screening mammo on left  # Kadcyla today; zometa; Lupron # follow up in 3 week- MD;  port- labs; cbc/cmp; Kadcyla; MUGA scan-  -Dr.B

## 2022-10-22 NOTE — Progress Notes (Signed)
Pt would rather hold urine pregnancy test at this time and speak with Dr. Donneta Romberg during office visit. Pt denies any chance of being pregnant, stating her husband has had a vasectomy and that she has not had a period since starting treatments. Dr. Donneta Romberg aware.

## 2022-10-22 NOTE — Progress Notes (Signed)
[facility] CONSULT NOTE  [name], [name], [name], [name]  CHIEF COMPLAINTS/PURPOSE OF CONSULTATION: Breast cancer  Oncology History Overview Note  MAY 2023-    Targeted ultrasound is performed, showing a 2.9 x 1.9 x 2.0 cm irregular hypoechoic mass right breast 10 o'clock position 6 cm from nipple at the site of palpable concern.   There is an abnormal 8 mm thickened right axillary lymph node.   There is skin thickening overlying the right breast. ------------------  A. BREAST, RIGHT AT 10:00, 6 CM FROM THE NIPPLE; ULTRASOUND-GUIDED CORE  NEEDLE BIOPSY:  - INVASIVE MAMMARY CARCINOMA, NO SPECIAL TYPE.   Size of invasive carcinoma: 11 mm in this sample  Histologic grade of invasive carcinoma: Grade 2                       Glandular/tubular differentiation score: 3                       Nuclear pleomorphism score: 2                       Mitotic rate score: 1                       Total score: 6  Ductal carcinoma in situ: Present, intermediate grade  Lymphovascular invasion: Not identified   B. LYMPH NODE, RIGHT AXILLARY; ULTRASOUND-GUIDED CORE NEEDLE BIOPSY:  - 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 IN GREATEST  EXTENT.   Comment:  The malignancy in the primary breast lesion appears morphologically  different from tumor in the lymph node sampling, with tumor present in  lymph node more suggestive of a histologic grade 3, with significantly  increased mitotic activity and pleomorphism. Due to the discrepancy in  these morphologic patterns, ER/PR/HER2 immunohistochemistry will be  performed on both blocks A1 and B1, with reflex to FISH for HER2 2+. The  results will be reported in an addendum.   ADDENDUM:  CASE SUMMARY: BREAST BIOMARKER TESTS - A - BREAST, RIGHT AT 10:00  Estrogen Receptor  (ER) Status: POSITIVE          Percentage of cells with nuclear positivity: 71-80%          Average intensity of staining: Strong   Progesterone Receptor (PgR) Status: POSITIVE          Percentage of cells with nuclear positivity: 81-90%          Average intensity of staining: Strong   HER2 (by immunohistochemistry): POSITIVE (Score 3+)       Percentage of cells with uniform intense complete membrane  staining: 61-70%  HER2 displays a heterogeneous staining pattern, with areas showing a  strong 3+ staining pattern, and other regions with complete absence (0+)  of staining.   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  Discussed with Dr. [name] tumor heterogeneity. BREAST MRI- JUNE 2023- The patient's known malignancy in the upper outer quadrant of the right breast measures 3.8 x 2.8 x 3.6 cm. Numerous other suspicious masses are identified in the right breast as above involving the upper outer and upper inner quadrants, located both anterior and posterior to the known malignancy.  Numerous enhancing foci in the skin as above are worrisome for skin metastases ; Bx proven. Biopsy proven metastatic node in the right axilla.  No evidence of malignancy in the left breast;  Two mildly prominent right internal mammary nodes were not FDG avid on recent PET-CT imaging.  Currently on neoadjuvant chemotherapy- TCH  plus P x6 cycles;  MUGA scan May 31st, 2023- -61%.    # June 7th, 2023- NEO-ADJUVANT CHEMO- TCH+P x6 cycles- s/p Mastectomy [NOV 15th, 2023] [Dr.Byrnett]-  ypT2 [28mm]; ypN0.  # JAN mid, 2024 -TAMOXFEN 20 mg a day; ADD LupronFEB 2nd.2 2024 s/p RT maech, 22nd- 2024  # FEB 2nd, 2023- Kadcyla q 3 W x14 cycles;   # Lupron q3 M [/start 2/07-2022]   # Genetic counseling s/p- NEG for any deleterious mutations.     Carcinoma of upper-outer quadrant of right breast in female, estrogen receptor positive (HCC)  10/26/2021 Initial Diagnosis   Carcinoma of upper-outer quadrant of right breast in female, estrogen receptor positive (HCC)   10/26/2021 Cancer Staging   Staging form: Breast, AJCC 8th Edition - Clinical: Stage IIIA (cT4d, cN1, cM0, G2, ER+, PR+, HER2+) - Signed by [name], MD on 06/04/2022 Histologic grading system: 3 grade system   11/10/2021 - 03/01/2022 Chemotherapy   Patient is on Treatment Plan : BREAST  Docetaxel + Carboplatin + Trastuzumab + Pertuzumab  (TCHP) q21d      11/11/2021 - 01/13/2022 Chemotherapy   Patient is on Treatment Plan : BREAST  Docetaxel + Carboplatin + Trastuzumab + Pertuzumab  (TCHP) q21d       Genetic Testing   Negative genetic testing. No pathogenic variants identified on the [facility] CancerNext-Expanded+RNA panel. The report date is 11/29/2021.  The CancerNext-Expanded + RNAinsight gene panel offered by [employer] and includes sequencing and rearrangement analysis for the following 77 genes: IP, ALK, APC*, ATM*, AXIN2, BAP1, BARD1, BLM, BMPR1A, BRCA1*, BRCA2*, BRIP1*, CDC73, CDH1*,CDK4, CDKN1B, CDKN2A, CHEK2*, CTNNA1, DICER1, FANCC, FH, FLCN, GALNT12, KIF1B, LZTR1, MAX, MEN1, MET, MLH1*, MSH2*, MSH3, MSH6*, MUTYH*, NBN, NF1*, NF2, NTHL1, PALB2*, PHOX2B, PMS2*, POT1, PRKAR1A, PTCH1, PTEN*, RAD51C*, RAD51D*,RB1, RECQL, RET, SDHA, SDHAF2, SDHB, SDHC, SDHD, SMAD4, SMARCA4, SMARCB1, SMARCE1, STK11, SUFU, TMEM127, TP53*,TSC1, TSC2, VHL and XRCC2 (sequencing  and deletion/duplication); EGFR, EGLN1, HOXB13, KIT, MITF, PDGFRA, POLD1 and POLE (sequencing only); EPCAM and GREM1 (deletion/duplication only).   07/09/2022 -  Chemotherapy   Patient is on Treatment Plan : BREAST ADO-Trastuzumab Emtansine (Kadcyla) q21d      HISTORY OF PRESENTING ILLNESS: Patient is ambulating independently.  Accompanied by her husband.   Marilyn Page 47 y.o.  female right breast TRIPLE POSITIVE with T4- Stage III -s/p mastectomy-with expanders-currently on adjuvant kadcyla / tamoxifen + OFS  is here for follow-up.    Pt states it is time for her mammogram.  Patient recently started on Farxiga for her diabetes.  No new shortness of breath or  cough.  No fevers or chills.    Patient oral Potassium.  improvement in neuropathy and fingernail health.   Review of Systems  Constitutional:  Positive for malaise/fatigue. Negative for chills, diaphoresis, fever and weight loss.  HENT:  Negative for nosebleeds and sore throat.   Eyes:  Negative for double vision.  Respiratory:  Negative for cough, hemoptysis, sputum production, shortness of breath and wheezing.   Cardiovascular:  Negative for chest pain, palpitations, orthopnea and leg swelling.  Gastrointestinal:  Negative for abdominal pain, blood in stool, constipation, diarrhea, heartburn, melena, nausea and vomiting.  Genitourinary:  Negative for dysuria, frequency and urgency.  Musculoskeletal:  Positive for myalgias. Negative for back pain and joint pain.  Skin: Negative.  Negative for itching and rash.  Neurological:  Negative for dizziness, tingling, focal weakness, weakness and headaches.  Endo/Heme/Allergies:  Does not bruise/bleed easily.  Psychiatric/Behavioral:  Negative for depression. The patient is not nervous/anxious and does not have insomnia.      MEDICAL HISTORY:  Past Medical History:  Diagnosis Date   Anemia    Carcinoma of upper-outer quadrant of right breast in female, estrogen receptor positive  (HCC) 10/26/2021   Diabetes mellitus without complication (HCC)    Family history of adverse reaction to anesthesia    grandmother had issues but doesnt know what the issue was, she was older.   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 Axilla Butterfly hydro clip-MACRO METASTATIC MAMMARY CARCINOMA   BREAST BIOPSY Right 04/21/2022   [place] RT PLC BREAST LOC DEV   1ST LESION  INC US GUIDE 04/21/2022 [facility]   BREAST RECONSTRUCTION WITH PLACEMENT OF TISSUE EXPANDER AND FLEX HD (ACELLULAR HYDRATED DERMIS) Right 04/21/2022   Procedure: RIGHT BREAST RECONSTRUCTION WITH PLACEMENT OF TISSUE EXPANDER AND FLEX HD (ACELLULAR HYDRATED DERMIS);  Surgeon: Peggye Form, DO;  Location: [facility] OR[facility];  Service: Plastics;  Laterality: Right;   CESAREAN SECTION  2005/2008   DILATION AND CURETTAGE OF UTERUS  2003   PORTACATH PLACEMENT Left 11/09/2021   Procedure: INSERTION PORT-A-CATH;  Surgeon: Earline Mayotte, MD;  Location: [facility];  Service: General;  Laterality: Left;   REMOVAL OF TISSUE EXPANDER AND PLACEMENT OF IMPLANT Right 06/08/2022   Procedure: REMOVAL OF TISSUE EXPANDER;  Surgeon: Peggye Form, DO;  Location: [facility];  Service: Plastics;  Laterality: Right;   SIMPLE MASTECTOMY WITH AXILLARY SENTINEL NODE BIOPSY Right 04/21/2022   Procedure: SIMPLE MASTECTOMY WITH AXILLARY SENTINEL NODE BIOPSY;  Surgeon: Earline Mayotte, MD;  Location: [facility];  Service: General;  Laterality: Right;  wire localization of lymph node   SKIN BIOPSY Right 11/09/2021   Procedure: PUNCH BIOPSY SKIN, TATTOO LYMPH NODE RIGHT AXILLA;  Surgeon: Earline Mayotte, MD;  Location: [facility];  Service: General;  Laterality: Right;   WISDOM  TOOTH EXTRACTION      SOCIAL HISTORY: Social History   Socioeconomic History   Marital status: Married    Spouse name: Fraser Din   Number of children: 2   Years of education: Not on file   Highest education level: Not on file  Occupational History   Occupation: and Architectural technologist  Tobacco Use   Smoking status: Never   Smokeless tobacco: Never  Vaping  Use   Vaping Use: Never used  Substance and Sexual Activity   Alcohol use: Never   Drug use: Never   Sexual activity: Yes    Birth control/protection: Other-see comments, Surgical    Comment: vasectomy  Other Topics Concern   Not on file  Social History Narrative   Pre-school teacher; in [place]; 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    Social Determinants of Health   Financial Resource Strain: Not on file  Food Insecurity: Not on file  Transportation Needs: Not on file  Physical Activity: Inactive (09/26/2017)   Exercise Vital Sign    Days of Exercise per Week: 0 days    Minutes of Exercise per Session: 0 min  Stress: Not on file  Social Connections: Not on file  Intimate Partner Violence: Not on file    FAMILY HISTORY: Family History  Problem Relation Age of Onset   Ovarian cysts Mother    Migraines Mother    Melanoma Mother        on leg   Hyperlipidemia Father    Diabetes Maternal Aunt    Stomach cancer Paternal Aunt    Diabetes Maternal Grandfather    Lymphoma Cousin 17   Breast cancer Neg Hx     ALLERGIES:  is allergic to metformin and oxycodone.  MEDICATIONS:  Current Outpatient Medications  Medication Sig Dispense Refill   acetaminophen (TYLENOL) 325 MG tablet Take 2 tablets (650 mg total) by mouth every 6 (six) hours as needed for mild pain (or Fever >/= 101).     ascorbic acid (VITAMIN C) 500 MG tablet Take 1 tablet (500 mg total) by mouth daily. 30 tablet    benzonatate (TESSALON) 200 MG capsule Take 1 capsule (200 mg total) by mouth 3 (three) times daily as needed for cough. 60 capsule 0   calcium-vitamin  D (OSCAL WITH D) 500-5 MG-MCG tablet Take 1 tablet by mouth 2 (two) times daily. 60 tablet 2   chlorpheniramine-HYDROcodone (TUSSIONEX) 10-8 MG/5ML Take 5 mLs by mouth at bedtime as needed for cough. May cause drowsiness. 140 mL 0   Cyanocobalamin (VITAMIN B-12 PO) Take 1 tablet by mouth daily.     Dapagliflozin Propanediol (FARXIGA PO) Take 5 mg by mouth daily.     ferrous sulfate 325 (65 FE) MG tablet Take 325 mg by mouth 2 (two) times daily with a meal.     lidocaine-prilocaine (EMLA) cream Apply on the port. 30 -45 min  prior to port access. 30 g 3   loratadine (CLARITIN) 10 MG tablet Take 10 mg by mouth as needed for allergies. Day before tx, day of and day after.     Magnesium Cl-Calcium Carbonate (SLOW MAGNESIUM/CALCIUM) 70-117 MG TBEC Take 1 tablet by mouth 2 (two) times daily. 60 tablet 6   Multiple Vitamin (MULTIVITAMIN WITH MINERALS) TABS tablet Take 1 tablet by mouth daily.     ondansetron (ZOFRAN) 4 MG tablet Take 1 tablet (4 mg total) by mouth every 8 (eight) hours as needed for up to 20 doses for nausea or vomiting. 20 tablet 0   ondansetron (ZOFRAN-ODT) 8 MG disintegrating tablet Take 8 mg by mouth every 8 (eight) hours as needed for nausea or vomiting.     ONETOUCH VERIO test strip SMARTSIG:1 Each Via Meter Daily PRN     potassium chloride (KLOR-CON) 20 MEQ packet Take 20 mEq by mouth 2 (two) times daily. 60 packet 3   prochlorperazine (COMPAZINE) 10 MG tablet Take 1 tablet (10 mg total) by mouth every  6 (six) hours as needed for nausea or vomiting. 40 tablet 1   silver sulfADIAZINE (SILVADENE) 1 % cream Apply 1 Application topically 2 (two) times daily. 50 g 2   tamoxifen (NOLVADEX) 20 MG tablet TAKE 1 TABLET BY MOUTH EVERY DAY 90 tablet 1   No current facility-administered medications for this visit.      Marland Kitchen  PHYSICAL EXAMINATION: ECOG PERFORMANCE STATUS: 0 - Asymptomatic  Vitals:   10/22/22 0857  BP: (!) 96/54  Pulse: 76  Temp: 97.7 F (36.5 C)  SpO2: 98%        Filed Weights   10/22/22 0857  Weight: 213 lb 9.6 oz (96.9 kg)      Physical Exam Vitals and nursing note reviewed.  HENT:     Head: Normocephalic and atraumatic.     Mouth/Throat:     Pharynx: Oropharynx is clear.  Eyes:     Extraocular Movements: Extraocular movements intact.     Pupils: Pupils are equal, round, and reactive to light.  Cardiovascular:     Rate and Rhythm: Normal rate and regular rhythm.  Pulmonary:     Comments: Decreased breath sounds bilaterally.  Abdominal:     Palpations: Abdomen is soft.  Musculoskeletal:        General: Normal range of motion.     Cervical back: Normal range of motion.  Skin:    General: Skin is warm.  Neurological:     General: No focal deficit present.     Mental Status: She is alert and oriented to person, place, and time.  Psychiatric:        Behavior: Behavior normal.        Judgment: Judgment normal.      LABORATORY DATA:  I have reviewed the data as listed Lab Results  Component Value Date   WBC 4.5 10/22/2022   HGB 11.4 (L) 10/22/2022   HCT 35.0 (L) 10/22/2022   MCV 86.0 10/22/2022   PLT 158 10/22/2022   Recent Labs    12/27/21 2105 12/28/21 0329 09/10/22 0857 10/01/22 1015 10/22/22 0856  NA 134*   < > 136 136 139  K 3.6   < > 3.4* 3.5 3.7  CL 104   < > 104 102 103  CO2 22   < > 27 27 27   GLUCOSE 192*   < > 200* 202* 176*  BUN 16   < > 12 13 13   CREATININE 0.93   < > 0.69 0.63 0.61  CALCIUM 8.6*   < > 8.3* 9.0 9.0  GFRNONAA >60   < > >60 >60 >60  PROT 7.0   < > 6.8 6.8 6.9  ALBUMIN 3.7   < > 3.0* 3.2* 3.2*  AST 42*   < > 29 30 31   ALT 51*   < > 20 26 29   ALKPHOS 70   < > 50 52 50  BILITOT 1.1   < > 0.3 0.4 0.4  BILIDIR 0.2  --   --   --   --   IBILI 0.9  --   --   --   --    < > = values in this interval not displayed.    RADIOGRAPHIC STUDIES: I have personally reviewed the radiological images as listed and agreed with the findings in the report. No results found.  ASSESSMENT &  PLAN:   Carcinoma of upper-outer quadrant of right breast in female, estrogen receptor positive (HCC) # MAY 2023- STAGE III-T4N1 ER/PR positive HER2  POSITIVE RIGHT breast cancer; LN-positive-ER/PR positive her-2 NEGATIVE; # s/p neoadjuvant chemotherapy- TCH plus P x 6 cycles- s/p Right mastectomy-[15th, NOv 2023] ypT2 [75mm]; ypN0.  Given partial response/involvement of the breast skin/multifocal disease- s/p RT- 08/27/2022. atient currently on tamoxifen [DEC-JAN 2024]; + OFS- Lupron every 3 months ]. ON Ado-trastuzumab emtansine  for 14 cycles.   # proceed with Ado-trastuzumab emtansine #6.  of planned 14 cycles.  MUGA- JAN 2024- Normal LEFT ventricular ejection fraction of 57.1%.  Will repeat the scan after finishing chemotherapy. Stable. Will order left mammogram; MUGA scan   # Allergic reaction to Ado-trastuzumab emtansine-fever/rash- currently on Claritin x3 days postchemotherapy; Tylenol preemptively.  Decreased Benadryl 25 mg given drowsiness. Stable.   # Hypocalcemia- Continue calcium 1200 plus vitamin D-3 1000-over-the-counter-1 pill a day.  Discussed the role of Zometa every 6 months for 3 years/on adjuvant basis.   Continue ca+Vit D BID. Vit D FEB 2024- 54; HOLD zometa today- slightly low ca [albumin 3.0]  # Diabetes- BG 200 [postbar]- currently on Farxiga 5mg - [OFF ozempic/metformin- intolerance]- Stable.  # mild hypotension: check BP at home; bring a  log  # Risk of lymphedema- s/p  Maureen evaluation- s/p re-evaluation.   # Hypokalemia: continue Kdur BID.   # PV access: port functional  Lupron 10/22/2022 - q 1M; Zometa q 6 M-  10/22/22  # DISPOSITION: # screening mammo on left  # Kadcyla today; zometa; Lupron # follow up in 3 week- MD;  port- labs; cbc/cmp; Kadcyla; MUGA scan-  -Dr.B  All questions were answered. The patient/family knows to call the clinic with any problems, questions or concerns.     [name], MD 10/22/2022 10:02 AM

## 2022-10-22 NOTE — Progress Notes (Signed)
Pt states it is time for her mammogram, can you put in the order for her?

## 2022-10-22 NOTE — Patient Instructions (Addendum)
[facility]  Discharge Instructions: Thank you for choosing [facility] to provide your oncology and hematology care.  If you have a lab appointment with the Cancer Center, please go directly to the Cancer Center and check in at the registration area.  Wear comfortable clothing and clothing appropriate for easy access to any Portacath or PICC line.   We strive to give you quality time with your provider. You may need to reschedule your appointment if you arrive late (15 or more minutes).  Arriving late affects you and other patients whose appointments are after yours.  Also, if you miss three or more appointments without notifying the office, you may be dismissed from the clinic at the provider's discretion.      For prescription refill requests, have your pharmacy contact our office and allow 72 hours for refills to be completed.    Today you received the following chemotherapy and/or immunotherapy agents kadcyla      To help prevent nausea and vomiting after your treatment, we encourage you to take your nausea medication as directed.  BELOW ARE SYMPTOMS THAT SHOULD BE REPORTED IMMEDIATELY: *FEVER GREATER THAN 100.4 F (38 C) OR HIGHER *CHILLS OR SWEATING *NAUSEA AND VOMITING THAT IS NOT CONTROLLED WITH YOUR NAUSEA MEDICATION *UNUSUAL SHORTNESS OF BREATH *UNUSUAL BRUISING OR BLEEDING *URINARY PROBLEMS (pain or burning when urinating, or frequent urination) *BOWEL PROBLEMS (unusual diarrhea, constipation, pain near the anus) TENDERNESS IN MOUTH AND THROAT WITH OR WITHOUT PRESENCE OF ULCERS (sore throat, sores in mouth, or a toothache) UNUSUAL RASH, SWELLING OR PAIN  UNUSUAL VAGINAL DISCHARGE OR ITCHING   Items with * indicate a potential emergency and should be followed up as soon as possible or go to the Emergency Department if any problems should occur.  Please show the CHEMOTHERAPY ALERT CARD or IMMUNOTHERAPY ALERT CARD at check-in to  the Emergency Department and triage nurse.  Should you have questions after your visit or need to cancel or reschedule your appointment, please contact [facility]  [phone] and follow the prompts.  Office hours are 8:00 a.m. to 4:30 p.m. Monday - Friday. Please note that voicemails left after 4:00 p.m. may not be returned until the following business day.  We are closed weekends and major holidays. You have access to a nurse at all times for urgent questions. Please call the main number to the clinic [phone] and follow the prompts.  For any non-urgent questions, you may also contact your provider using MyChart. We now offer e-Visits for anyone 34 and older to request care online for non-urgent symptoms. For details visit [email or web address].   Also download the MyChart app! Go to the app store, search "MyChart", open the app, select [place], and log in with your MyChart username and password.  Zoledronic Acid Injection (Cancer) What is this medication? ZOLEDRONIC ACID (ZOE le dron ik AS id) treats high calcium levels in the blood caused by cancer. It may also be used with chemotherapy to treat weakened bones caused by cancer. It works by slowing down the release of calcium from bones. This lowers calcium levels in your blood. It also makes your bones stronger and less likely to break (fracture). It belongs to a group of medications called bisphosphonates. This medicine may be used for other purposes; ask your health care provider or pharmacist if you have questions. COMMON BRAND NAME(S): Zometa, Zometa Powder What should I tell my care team before I take  this medication? They need to know if you have any of these conditions: Dehydration Dental disease Kidney disease Liver disease Low levels of calcium in the blood Lung or breathing disease, such as asthma Receiving steroids, such as dexamethasone or prednisone An unusual or allergic reaction  to zoledronic acid, other medications, foods, dyes, or preservatives Pregnant or trying to get pregnant Breast-feeding How should I use this medication? This medication is injected into a vein. It is given by your care team in a hospital or clinic setting. Talk to your care team about the use of this medication in children. Special care may be needed. Overdosage: If you think you have taken too much of this medicine contact a poison control center or emergency room at once. NOTE: This medicine is only for you. Do not share this medicine with others. What if I miss a dose? Keep appointments for follow-up doses. It is important not to miss your dose. Call your care team if you are unable to keep an appointment. What may interact with this medication? Certain antibiotics given by injection Diuretics, such as bumetanide, furosemide NSAIDs, medications for pain and inflammation, such as ibuprofen or naproxen Teriparatide Thalidomide This list may not describe all possible interactions. Give your health care provider a list of all the medicines, herbs, non-prescription drugs, or dietary supplements you use. Also tell them if you smoke, drink alcohol, or use illegal drugs. Some items may interact with your medicine. What should I watch for while using this medication? Visit your care team for regular checks on your progress. It may be some time before you see the benefit from this medication. Some people who take this medication have severe bone, joint, or muscle pain. This medication may also increase your risk for jaw problems or a broken thigh bone. Tell your care team right away if you have severe pain in your jaw, bones, joints, or muscles. Tell you care team if you have any pain that does not go away or that gets worse. Tell your dentist and dental surgeon that you are taking this medication. You should not have major dental surgery while on this medication. See your dentist to have a dental exam  and fix any dental problems before starting this medication. Take good care of your teeth while on this medication. Make sure you see your dentist for regular follow-up appointments. You should make sure you get enough calcium and vitamin D while you are taking this medication. Discuss the foods you eat and the vitamins you take with your care team. Check with your care team if you have severe diarrhea, nausea, and vomiting, or if you sweat a lot. The loss of too much body fluid may make it dangerous for you to take this medication. You may need bloodwork while taking this medication. Talk to your care team if you wish to become pregnant or think you might be pregnant. This medication can cause serious birth defects. What side effects may I notice from receiving this medication? Side effects that you should report to your care team as soon as possible: Allergic reactions--skin rash, itching, hives, swelling of the face, lips, tongue, or throat Kidney injury--decrease in the amount of urine, swelling of the ankles, hands, or feet Low calcium level--muscle pain or cramps, confusion, tingling, or numbness in the hands or feet Osteonecrosis of the jaw--pain, swelling, or redness in the mouth, numbness of the jaw, poor healing after dental work, unusual discharge from the mouth, visible bones in the mouth  Severe bone, joint, or muscle pain Side effects that usually do not require medical attention (report to your care team if they continue or are bothersome): Constipation Fatigue Fever Loss of appetite Nausea Stomach pain This list may not describe all possible side effects. Call your doctor for medical advice about side effects. You may report side effects to FDA at 1-800-FDA-1088. Where should I keep my medication? This medication is given in a hospital or clinic. It will not be stored at home. NOTE: This sheet is a summary. It may not cover all possible information. If you have questions about  this medicine, talk to your doctor, pharmacist, or health care provider.  2023 Elsevier/Gold Standard (2007-07-15 00:00:00)

## 2022-11-08 ENCOUNTER — Encounter
Admission: RE | Admit: 2022-11-08 | Discharge: 2022-11-08 | Disposition: A | Discharge status: Home / Self Care | Payer: 59 | Source: Ambulatory Visit | Attending: Internal Medicine | Admitting: Internal Medicine

## 2022-11-08 DIAGNOSIS — Z170 Estrogen receptor positive status [ER+]: Secondary | ICD-10-CM

## 2022-11-08 DIAGNOSIS — Z9221 Personal history of antineoplastic chemotherapy: Secondary | ICD-10-CM | POA: Diagnosis present

## 2022-11-08 DIAGNOSIS — C50411 Malignant neoplasm of upper-outer quadrant of right female breast: Secondary | ICD-10-CM | POA: Insufficient documentation

## 2022-11-08 MED ORDER — TECHNETIUM TC 99M-LABELED RED BLOOD CELLS IV KIT
21.7300 | PACK | Freq: Once | INTRAVENOUS | Status: AC | PRN
  Administered 2022-11-08: 21.73 via INTRAVENOUS

## 2022-11-09 ENCOUNTER — Ambulatory Visit: Payer: 59 | Admitting: Plastic Surgery

## 2022-11-09 ENCOUNTER — Encounter: Payer: Self-pay | Admitting: Plastic Surgery

## 2022-11-09 VITALS — BP 117/67 | HR 89

## 2022-11-09 DIAGNOSIS — Z170 Estrogen receptor positive status [ER+]: Secondary | ICD-10-CM

## 2022-11-09 DIAGNOSIS — Z9011 Acquired absence of right breast and nipple: Secondary | ICD-10-CM

## 2022-11-09 DIAGNOSIS — C50411 Malignant neoplasm of upper-outer quadrant of right female breast: Secondary | ICD-10-CM

## 2022-11-09 NOTE — Progress Notes (Signed)
   Subjective:    Patient ID: Marilyn Page, female    DOB: Nov 03, 1975, 47 y.o.   MRN: [id number]  The patient is a 47 year old female here for follow-up on breast reconstruction.  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  She had estrogen and progesterone positive and HER2 positive findings.  She is 5 feet 6 inches tall weighs 220 pounds.  She underwent a right mastectomy with expander placement in November 2023.  She had very tight skin and had a very hard time with expansion.  She developed a seroma and this was delaying her radiation.  Skin is very tight but seems to be healing well from the radiation.  We made the decision to remove the expander in January.  She finished her radiation and March.  She still has chemo for another couple of months.     Review of Systems  Constitutional: Negative.   HENT: Negative.    Eyes: Negative.   Respiratory: Negative.  Negative for chest tightness and shortness of breath.   Cardiovascular: Negative.   Gastrointestinal: Negative.   Endocrine: Negative.   Genitourinary: Negative.   Musculoskeletal: Negative.        Objective:   Physical Exam Vitals and nursing note reviewed.  Constitutional:      Appearance: Normal appearance.  Cardiovascular:     Rate and Rhythm: Normal rate.     Pulses: Normal pulses.  Pulmonary:     Effort: Pulmonary effort is normal.  Musculoskeletal:        General: No swelling or deformity.  Skin:    General: Skin is warm.     Capillary Refill: Capillary refill takes less than 2 seconds.     Coloration: Skin is not jaundiced.  Neurological:     Mental Status: She is alert and oriented to person, place, and time.         Assessment & Plan:     ICD-10-CM   1. Acquired absence of right breast  Z90.11     2. Carcinoma of upper-outer quadrant of right breast in female, estrogen receptor positive (HCC)  C50.411    Z17.0        Today we  talked about the possibility of a latissimus flap, Diep flap or a TRAM flap.  She is leaning towards Diep flap.  She will come back and see [place] in 6 months and that would be for a right sided breast reconstruction.  Pictures were obtained of the patient and placed in the chart with the patient's or guardian's permission.

## 2022-11-10 ENCOUNTER — Other Ambulatory Visit: Payer: Self-pay

## 2022-11-12 ENCOUNTER — Inpatient Hospital Stay: Payer: 59

## 2022-11-12 ENCOUNTER — Inpatient Hospital Stay: Payer: 59 | Attending: Internal Medicine | Admitting: Internal Medicine

## 2022-11-12 ENCOUNTER — Encounter: Payer: Self-pay | Admitting: Internal Medicine

## 2022-11-12 VITALS — BP 114/55 | HR 81 | Temp 97.4°F | Ht 66.0 in | Wt 213.2 lb

## 2022-11-12 DIAGNOSIS — D696 Thrombocytopenia, unspecified: Secondary | ICD-10-CM | POA: Insufficient documentation

## 2022-11-12 DIAGNOSIS — C773 Secondary and unspecified malignant neoplasm of axilla and upper limb lymph nodes: Secondary | ICD-10-CM | POA: Diagnosis not present

## 2022-11-12 DIAGNOSIS — C50411 Malignant neoplasm of upper-outer quadrant of right female breast: Secondary | ICD-10-CM

## 2022-11-12 DIAGNOSIS — I959 Hypotension, unspecified: Secondary | ICD-10-CM | POA: Insufficient documentation

## 2022-11-12 DIAGNOSIS — Z170 Estrogen receptor positive status [ER+]: Secondary | ICD-10-CM | POA: Diagnosis not present

## 2022-11-12 DIAGNOSIS — E119 Type 2 diabetes mellitus without complications: Secondary | ICD-10-CM | POA: Diagnosis not present

## 2022-11-12 DIAGNOSIS — E876 Hypokalemia: Secondary | ICD-10-CM | POA: Insufficient documentation

## 2022-11-12 DIAGNOSIS — Z5112 Encounter for antineoplastic immunotherapy: Secondary | ICD-10-CM | POA: Diagnosis present

## 2022-11-12 LAB — CBC WITH DIFFERENTIAL/PLATELET
Abs Immature Granulocytes: 0.02 10*3/uL (ref 0.00–0.07)
Basophils Absolute: 0.1 10*3/uL (ref 0.0–0.1)
Basophils Relative: 1 %
Eosinophils Absolute: 0.1 10*3/uL (ref 0.0–0.5)
Eosinophils Relative: 3 %
HCT: 35 % — ABNORMAL LOW (ref 36.0–46.0)
Hemoglobin: 11.5 g/dL — ABNORMAL LOW (ref 12.0–15.0)
Immature Granulocytes: 0 %
Lymphocytes Relative: 24 %
Lymphs Abs: 1.1 10*3/uL (ref 0.7–4.0)
MCH: 27.8 pg (ref 26.0–34.0)
MCHC: 32.9 g/dL (ref 30.0–36.0)
MCV: 84.5 fL (ref 80.0–100.0)
Monocytes Absolute: 0.4 10*3/uL (ref 0.1–1.0)
Monocytes Relative: 8 %
Neutro Abs: 3 10*3/uL (ref 1.7–7.7)
Neutrophils Relative %: 64 %
Platelets: 126 10*3/uL — ABNORMAL LOW (ref 150–400)
RBC: 4.14 MIL/uL (ref 3.87–5.11)
RDW: 15.1 % (ref 11.5–15.5)
WBC: 4.7 10*3/uL (ref 4.0–10.5)
nRBC: 0 % (ref 0.0–0.2)

## 2022-11-12 LAB — COMPREHENSIVE METABOLIC PANEL
ALT: 29 U/L (ref 0–44)
AST: 30 U/L (ref 15–41)
Albumin: 3 g/dL — ABNORMAL LOW (ref 3.5–5.0)
Alkaline Phosphatase: 53 U/L (ref 38–126)
Anion gap: 6 (ref 5–15)
BUN: 12 mg/dL (ref 6–20)
CO2: 27 mmol/L (ref 22–32)
Calcium: 8.9 mg/dL (ref 8.9–10.3)
Chloride: 104 mmol/L (ref 98–111)
Creatinine, Ser: 0.5 mg/dL (ref 0.44–1.00)
GFR, Estimated: >60 mL/min (ref >60)
Glucose, Bld: 148 mg/dL — ABNORMAL HIGH (ref 70–99)
Potassium: 3.4 mmol/L — ABNORMAL LOW (ref 3.5–5.1)
Sodium: 137 mmol/L (ref 135–145)
Total Bilirubin: 0.6 mg/dL (ref 0.3–1.2)
Total Protein: 6.8 g/dL (ref 6.5–8.1)

## 2022-11-12 MED ORDER — ACETAMINOPHEN 325 MG PO TABS
650.0000 mg | ORAL_TABLET | Freq: Once | ORAL | Status: AC
  Administered 2022-11-12: 650 mg via ORAL
  Filled 2022-11-12: qty 2

## 2022-11-12 MED ORDER — PROCHLORPERAZINE MALEATE 10 MG PO TABS
10.0000 mg | ORAL_TABLET | Freq: Once | ORAL | Status: AC
  Administered 2022-11-12: 10 mg via ORAL
  Filled 2022-11-12: qty 1

## 2022-11-12 MED ORDER — SODIUM CHLORIDE 0.9 % IV SOLN
3.6000 mg/kg | Freq: Once | INTRAVENOUS | Status: AC
  Administered 2022-11-12: 360 mg via INTRAVENOUS
  Filled 2022-11-12: qty 8

## 2022-11-12 MED ORDER — DIPHENHYDRAMINE HCL 25 MG PO CAPS
25.0000 mg | ORAL_CAPSULE | Freq: Once | ORAL | Status: AC
  Administered 2022-11-12: 25 mg via ORAL
  Filled 2022-11-12: qty 1

## 2022-11-12 MED ORDER — SODIUM CHLORIDE 0.9% FLUSH
10.0000 mL | Freq: Once | INTRAVENOUS | Status: AC
  Administered 2022-11-12: 10 mL via INTRAVENOUS
  Filled 2022-11-12: qty 10

## 2022-11-12 MED ORDER — HEPARIN SOD (PORK) LOCK FLUSH 100 UNIT/ML IV SOLN
500.0000 [IU] | Freq: Once | INTRAVENOUS | Status: AC | PRN
  Administered 2022-11-12: 500 [IU]
  Filled 2022-11-12: qty 5

## 2022-11-12 MED ORDER — SODIUM CHLORIDE 0.9 % IV SOLN
Freq: Once | INTRAVENOUS | Status: AC
  Filled 2022-11-12: qty 250

## 2022-11-12 NOTE — Assessment & Plan Note (Addendum)
#   MAY 2023- STAGE III-T4N1 ER/PR positive HER2 POSITIVE RIGHT breast cancer; LN-positive-ER/PR positive her-2 NEGATIVE; # s/p neoadjuvant chemotherapy- TCH plus P x 6 cycles- s/p Right mastectomy-[15th, NOv 2023] ypT2 [30mm]; ypN0.  Given partial response/involvement of the breast skin/multifocal disease- s/p RT- 08/27/2022. atient currently on tamoxifen [DEC-JAN 2024]; + OFS- Lupron every 3 months ]. ON Ado-trastuzumab emtansine  for 14 cycles.   # proceed with Ado-trastuzumab emtansine #7 of planned 14 cycles.  MUGA- JAN 2024- Normal LEFT ventricular ejection fraction of 57.1%.  Will repeat the scan after finishing chemotherapy. Stable. ordered left mammogram; MUGA scan -Penindg  # Allergic reaction to Ado-trastuzumab emtansine-fever/rash- currently on Claritin x3 days postchemotherapy; Tylenol preemptively.  Decreased Benadryl 25 mg given drowsiness. Stable.   # Hypocalcemia-  ON adjuvant Zometa q 6 months x 3 years [first- May-17th, 2024].   Continue ca+Vit D BID. Vit D FEB 2024- 54; [albumin 3.0]  # Infusion reaction - ZOmeta [flu like symtoms]- imprived with NSAIDs.   # Diabetes- BG 200 [postbar]- currently on Farxiga 5mg - [OFF ozempic/metformin- intolerance]- Stable.  # mild hypotension: reviewed BP at home- stable.   # Risk of lymphedema- s/p  Maureen evaluation- s/p re-evaluation.   # Hypokalemia: continue Kdur BID.   # PV access: port functional  Lupron 10/22/2022 - q 94M; Zometa q 6 M- #1 10/22/22  # DISPOSITION: # Kadcyla today;  # follow up in 3 week- MD;  port- labs; cbc/cmp; Kadcyla- Dr.B

## 2022-11-12 NOTE — Progress Notes (Signed)
With last tx had fever, headache and aches for x2-3 days after. States she started a new tx last visit.  C/o dry mouth at night.  C/o having sinus symptoms.

## 2022-11-12 NOTE — Progress Notes (Signed)
[name] NOTE  Patient Care Team: Latanya Maudlin, NP as PCP - General (Family Medicine) Daiva Huge, RN as Oncology Nurse Navigator Cammie Sickle, [name]  CHIEF COMPLAINTS/PURPOSE OF CONSULTATION: Breast cancer  Oncology History Overview Note  MAY 2023-    Targeted ultrasound is performed, showing a 2.9 x 1.9 x 2.0 cm irregular hypoechoic mass right breast 10 o'clock position 6 cm from nipple at the site of palpable concern.   There is an abnormal 8 mm thickened right axillary lymph node.   There is skin thickening overlying the right breast. ------------------  A. BREAST, RIGHT AT 10:00, 6 CM FROM THE NIPPLE; ULTRASOUND-GUIDED CORE  NEEDLE BIOPSY:  - INVASIVE MAMMARY CARCINOMA, NO SPECIAL TYPE.   Size of invasive carcinoma: 11 mm in this sample  Histologic grade of invasive carcinoma: Grade 2                       Glandular/tubular differentiation score: 3                       Nuclear pleomorphism score: 2                       Mitotic rate score: 1                       Total score: 6  Ductal carcinoma in situ: Present, intermediate grade  Lymphovascular invasion: Not identified   B. LYMPH NODE, RIGHT AXILLARY; ULTRASOUND-GUIDED CORE NEEDLE BIOPSY:  - 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 IN GREATEST  EXTENT.   Comment:  The malignancy in the primary breast lesion appears morphologically  different from tumor in the lymph node sampling, with tumor present in  lymph node more suggestive of a histologic grade 3, with significantly  increased mitotic activity and pleomorphism. Due to the discrepancy in  these morphologic patterns, ER/PR/HER2 immunohistochemistry will be  performed on both blocks A1 and B1, with reflex to [place] for HER2 2+. The  results will be reported in an addendum.   ADDENDUM:  CASE SUMMARY: BREAST BIOMARKER TESTS - A - BREAST, RIGHT AT 10:00  Estrogen Receptor  (ER) Status: POSITIVE          Percentage of cells with nuclear positivity: 71-80%          Average intensity of staining: Strong   Progesterone Receptor (PgR) Status: POSITIVE          Percentage of cells with nuclear positivity: 81-90%          Average intensity of staining: Strong   HER2 (by immunohistochemistry): POSITIVE (Score 3+)       Percentage of cells with uniform intense complete membrane  staining: 61-70%  HER2 displays a heterogeneous staining pattern, with areas showing a  strong 3+ staining pattern, and other regions with complete absence (0+)  of staining.   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  Discussed with Dr. [name] tumor heterogeneity. BREAST MRI- JUNE 2023- The patient's known malignancy in the upper outer quadrant of the right breast measures 3.8 x 2.8 x 3.6 cm. Numerous other suspicious masses are identified in the right breast as above involving the upper outer and upper inner quadrants, located both anterior and posterior to the known malignancy.  Numerous enhancing foci in the skin as above are worrisome for skin metastases ; Bx proven. Biopsy proven metastatic node in the right axilla.  No evidence of malignancy in the left breast;  Two mildly prominent right internal mammary nodes were not FDG avid on recent PET-CT imaging.  Currently on neoadjuvant chemotherapy- TCH  plus P x6 cycles;  MUGA scan May 31st, 2023- -61%.    # June 7th, 2023- NEO-ADJUVANT CHEMO- TCH+P x6 cycles- s/p Mastectomy [NOV 15th, 2023] [Dr.Byrnett]-  ypT2 [60mm]; ypN0.  # JAN mid, 2024 -TAMOXFEN 20 mg a day; ADD LupronFEB 2nd.2 2024 s/p RT maech, 22nd- 2024  # FEB 2nd, 2023- Kadcyla q 3 W x14 cycles;   # Lupron q3 M [/start 2/07-2022]   # Genetic counseling s/p- NEG for any deleterious mutations.     Carcinoma of upper-outer quadrant of right breast in female, estrogen receptor positive (HCC)  10/26/2021 Initial Diagnosis   Carcinoma of upper-outer quadrant of right breast in female, estrogen receptor positive (HCC)   10/26/2021 Cancer Staging   Staging form: Breast, AJCC 8th Edition - Clinical: Stage IIIA (cT4d, cN1, cM0, G2, ER+, PR+, HER2+) - Signed by [name], MD on 06/04/2022 Histologic grading system: 3 grade system   11/10/2021 - 03/01/2022 Chemotherapy   Patient is on Treatment Plan : BREAST  Docetaxel + Carboplatin + Trastuzumab + Pertuzumab  (TCHP) q21d      11/11/2021 - 01/13/2022 Chemotherapy   Patient is on Treatment Plan : BREAST  Docetaxel + Carboplatin + Trastuzumab + Pertuzumab  (TCHP) q21d       Genetic Testing   Negative genetic testing. No pathogenic variants identified on the [facility] CancerNext-Expanded+RNA panel. The report date is 11/29/2021.  The CancerNext-Expanded + RNAinsight gene panel offered by [employer] and includes sequencing and rearrangement analysis for the following 77 genes: IP, ALK, APC*, ATM*, AXIN2, BAP1, BARD1, BLM, BMPR1A, BRCA1*, BRCA2*, BRIP1*, CDC73, CDH1*,CDK4, CDKN1B, CDKN2A, CHEK2*, CTNNA1, DICER1, FANCC, FH, FLCN, GALNT12, KIF1B, LZTR1, MAX, MEN1, MET, MLH1*, MSH2*, MSH3, MSH6*, MUTYH*, NBN, NF1*, NF2, NTHL1, PALB2*, PHOX2B, PMS2*, POT1, PRKAR1A, PTCH1, PTEN*, RAD51C*, RAD51D*,RB1, RECQL, RET, SDHA, SDHAF2, SDHB, SDHC, SDHD, SMAD4, SMARCA4, SMARCB1, SMARCE1, STK11, SUFU, TMEM127, TP53*,TSC1, TSC2, VHL and XRCC2 (sequencing  and deletion/duplication); EGFR, EGLN1, HOXB13, KIT, MITF, PDGFRA, POLD1 and POLE (sequencing only); EPCAM and GREM1 (deletion/duplication only).   07/09/2022 -  Chemotherapy   Patient is on Treatment Plan : BREAST ADO-Trastuzumab Emtansine (Kadcyla) q21d      HISTORY OF PRESENTING ILLNESS: Patient is ambulating independently.  Accompanied by her husband.   Marilyn Page 47 y.o.  female right breast TRIPLE POSITIVE with T4- Stage III -s/p mastectomy-with expanders-currently on adjuvant kadcyla / tamoxifen + OFS  is here for follow-up.    Post zometa had fever, headache and aches for x2-3 days after. States she started a new tx last visit.    C/o dry  mouth at night.    C/o having sinus symptoms.  But no new shortness of breath or cough.  No fevers or chills.    Patient oral Potassium.  improvement in neuropathy and fingernail health.   Review of Systems  Constitutional:  Positive for malaise/fatigue. Negative for chills, diaphoresis, fever and weight loss.  HENT:  Negative for nosebleeds and sore throat.   Eyes:  Negative for double vision.  Respiratory:  Negative for cough, hemoptysis, sputum production, shortness of breath and wheezing.   Cardiovascular:  Negative for chest pain, palpitations, orthopnea and leg swelling.  Gastrointestinal:  Negative for abdominal pain, blood in stool, constipation, diarrhea, heartburn, melena, nausea and vomiting.  Genitourinary:  Negative for dysuria, frequency and urgency.  Musculoskeletal:  Positive for myalgias. Negative for back pain and joint pain.  Skin: Negative.  Negative for itching and rash.  Neurological:  Negative for dizziness, tingling, focal weakness, weakness and headaches.  Endo/Heme/Allergies:  Does not bruise/bleed easily.  Psychiatric/Behavioral:  Negative for depression. The patient is not nervous/anxious and does not have insomnia.      MEDICAL HISTORY:  Past Medical History:  Diagnosis Date   Anemia    Carcinoma of  upper-outer quadrant of right breast in female, estrogen receptor positive (HCC) 10/26/2021   Diabetes mellitus without complication (HCC)    Family history of adverse reaction to anesthesia    grandmother had issues but doesnt know what the issue was, she was older.   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 Axilla Butterfly hydro clip-MACRO METASTATIC MAMMARY CARCINOMA   BREAST BIOPSY Right 04/21/2022   [place] RT PLC BREAST LOC DEV   1ST LESION  INC US GUIDE 04/21/2022 [facility]   BREAST RECONSTRUCTION WITH PLACEMENT OF TISSUE EXPANDER AND FLEX HD (ACELLULAR HYDRATED DERMIS) Right 04/21/2022   Procedure: RIGHT BREAST RECONSTRUCTION WITH PLACEMENT OF TISSUE EXPANDER AND FLEX HD (ACELLULAR HYDRATED DERMIS);  Surgeon: Peggye Form, DO;  Location: [facility];  Service: Plastics;  Laterality: Right;   CESAREAN SECTION  2005/2008   DILATION AND CURETTAGE OF UTERUS  2003   PORTACATH PLACEMENT Left 11/09/2021   Procedure: INSERTION PORT-A-CATH;  Surgeon: Earline Mayotte, MD;  Location: [facility];  Service: General;  Laterality: Left;   REMOVAL OF TISSUE EXPANDER AND PLACEMENT OF IMPLANT Right 06/08/2022   Procedure: REMOVAL OF TISSUE EXPANDER;  Surgeon: Peggye Form, DO;  Location: [facility];  Service: Plastics;  Laterality: Right;   SIMPLE MASTECTOMY WITH AXILLARY SENTINEL NODE BIOPSY Right 04/21/2022   Procedure: SIMPLE MASTECTOMY WITH AXILLARY SENTINEL NODE BIOPSY;  Surgeon: Earline Mayotte, MD;  Location: [facility];  Service: General;  Laterality: Right;  wire localization of lymph node   SKIN BIOPSY Right 11/09/2021   Procedure: PUNCH BIOPSY SKIN, TATTOO LYMPH NODE RIGHT AXILLA;  Surgeon: Earline Mayotte, MD;   Location: [facility];  Service: General;  Laterality: Right;   WISDOM TOOTH EXTRACTION      SOCIAL HISTORY: Social History   Socioeconomic History   Marital status: Married    Spouse name: Fraser Din   Number of children: 2   Years of education: Not on file   Highest education level: Not on file  Occupational History   Occupation:  and teacher's assistant  Tobacco Use   Smoking status: Never   Smokeless tobacco: Never  Vaping Use   Vaping Use: Never used  Substance and Sexual Activity   Alcohol use: Never   Drug use: Never   Sexual activity: Yes    Birth control/protection: Other-see comments, Surgical    Comment: vasectomy  Other Topics Concern   Not on file  Social History Narrative   Pre-school teacher; in [place]; 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    Social Determinants of Health   Financial Resource Strain: Not on file  Food Insecurity: Not on file  Transportation Needs: Not on file  Physical Activity: Inactive (09/26/2017)   Exercise Vital Sign    Days of Exercise per Week: 0 days    Minutes of Exercise per Session: 0 min  Stress: Not on file  Social Connections: Not on file  Intimate Partner Violence: Not on file    FAMILY HISTORY: Family History  Problem Relation Age of Onset   Ovarian cysts Mother    Migraines Mother    Melanoma Mother        on leg   Hyperlipidemia Father    Diabetes Maternal Aunt    Stomach cancer Paternal Aunt    Diabetes Maternal Grandfather    Lymphoma Cousin 17   Breast cancer Neg Hx     ALLERGIES:  is allergic to metformin and oxycodone.  MEDICATIONS:  Current Outpatient Medications  Medication Sig Dispense Refill   acetaminophen (TYLENOL) 325 MG tablet Take 2 tablets (650 mg total) by mouth every 6 (six) hours as needed for mild pain (or Fever >/= 101).     ascorbic acid (VITAMIN C) 500 MG tablet Take 1 tablet (500 mg total) by mouth daily. 30 tablet    calcium-vitamin D (OSCAL WITH D) 500-5 MG-MCG tablet Take 1 tablet by mouth 2  (two) times daily. 60 tablet 2   Cyanocobalamin (VITAMIN B-12 PO) Take 1 tablet by mouth daily.     Dapagliflozin Propanediol (FARXIGA PO) Take 5 mg by mouth daily.     ferrous sulfate 325 (65 FE) MG tablet Take 325 mg by mouth 2 (two) times daily with a meal.     lidocaine-prilocaine (EMLA) cream Apply on the port. 30 -45 min  prior to port access. 30 g 3   loratadine (CLARITIN) 10 MG tablet Take 10 mg by mouth as needed for allergies. Day before tx, day of and day after.     Magnesium Cl-Calcium Carbonate (SLOW MAGNESIUM/CALCIUM) 70-117 MG TBEC Take 1 tablet by mouth 2 (two) times daily. 60 tablet 6   Multiple Vitamin (MULTIVITAMIN WITH MINERALS) TABS tablet Take 1 tablet by mouth daily.     ondansetron (ZOFRAN) 4 MG tablet Take 1 tablet (4 mg total) by mouth every 8 (eight) hours as needed for up to 20 doses for nausea or vomiting. 20 tablet 0   ondansetron (ZOFRAN-ODT) 8 MG disintegrating tablet Take 8 mg by mouth every 8 (eight) hours as needed for nausea or vomiting.     ONETOUCH VERIO test strip SMARTSIG:1 Each Via Meter Daily PRN     potassium chloride (KLOR-CON) 20 MEQ packet Take 20 mEq by mouth 2 (two) times daily. 60 packet 3   prochlorperazine (COMPAZINE) 10 MG tablet Take 1 tablet (10 mg total) by mouth every 6 (six) hours as needed for nausea or vomiting. 40 tablet 1   tamoxifen (NOLVADEX) 20 MG tablet TAKE 1 TABLET BY MOUTH EVERY DAY 90 tablet 1  No current facility-administered medications for this visit.      Marland Kitchen  PHYSICAL EXAMINATION: ECOG PERFORMANCE STATUS: 0 - Asymptomatic  Vitals:   11/12/22 0910  BP: (!) 114/55  Pulse: 81  Temp: (!) 97.4 F (36.3 C)  SpO2: 99%       Filed Weights   11/12/22 0910  Weight: 213 lb 3.2 oz (96.7 kg)      Physical Exam Vitals and nursing note reviewed.  HENT:     Head: Normocephalic and atraumatic.     Mouth/Throat:     Pharynx: Oropharynx is clear.  Eyes:     Extraocular Movements: Extraocular movements intact.      Pupils: Pupils are equal, round, and reactive to light.  Cardiovascular:     Rate and Rhythm: Normal rate and regular rhythm.  Pulmonary:     Comments: Decreased breath sounds bilaterally.  Abdominal:     Palpations: Abdomen is soft.  Musculoskeletal:        General: Normal range of motion.     Cervical back: Normal range of motion.  Skin:    General: Skin is warm.  Neurological:     General: No focal deficit present.     Mental Status: She is alert and oriented to person, place, and time.  Psychiatric:        Behavior: Behavior normal.        Judgment: Judgment normal.      LABORATORY DATA:  I have reviewed the data as listed Lab Results  Component Value Date   WBC 4.7 11/12/2022   HGB 11.5 (L) 11/12/2022   HCT 35.0 (L) 11/12/2022   MCV 84.5 11/12/2022   PLT 126 (L) 11/12/2022   Recent Labs    12/27/21 2105 12/28/21 0329 10/01/22 1015 10/22/22 0856 11/12/22 0918  NA 134*   < > 136 139 137  K 3.6   < > 3.5 3.7 3.4*  CL 104   < > 102 103 104  CO2 22   < > 27 27 27   GLUCOSE 192*   < > 202* 176* 148*  BUN 16   < > 13 13 12   CREATININE 0.93   < > 0.63 0.61 0.50  CALCIUM 8.6*   < > 9.0 9.0 PENDING  GFRNONAA >60   < > >60 >60 >60  PROT 7.0   < > 6.8 6.9 6.8  ALBUMIN 3.7   < > 3.2* 3.2* 3.0*  AST 42*   < > 30 31 30   ALT 51*   < > 26 29 29   ALKPHOS 70   < > 52 50 53  BILITOT 1.1   < > 0.4 0.4 0.6  BILIDIR 0.2  --   --   --   --   IBILI 0.9  --   --   --   --    < > = values in this interval not displayed.    RADIOGRAPHIC STUDIES: I have personally reviewed the radiological images as listed and agreed with the findings in the report. No results found.  ASSESSMENT & PLAN:   Carcinoma of upper-outer quadrant of right breast in female, estrogen receptor positive (HCC) # MAY 2023- STAGE III-T4N1 ER/PR positive HER2 POSITIVE RIGHT breast cancer; LN-positive-ER/PR positive her-2 NEGATIVE; # s/p neoadjuvant chemotherapy- TCH plus P x 6 cycles- s/p Right  mastectomy-[15th, NOv 2023] ypT2 [67mm]; ypN0.  Given partial response/involvement of the breast skin/multifocal disease- s/p RT- 08/27/2022. atient currently on tamoxifen [DEC-JAN 2024]; + OFS- Lupron every  3 months ]. ON Ado-trastuzumab emtansine  for 14 cycles.   # proceed with Ado-trastuzumab emtansine #7 of planned 14 cycles.  MUGA- JAN 2024- Normal LEFT ventricular ejection fraction of 57.1%.  Will repeat the scan after finishing chemotherapy. Stable. ordered left mammogram; MUGA scan -Penindg  # Allergic reaction to Ado-trastuzumab emtansine-fever/rash- currently on Claritin x3 days postchemotherapy; Tylenol preemptively.  Decreased Benadryl 25 mg given drowsiness. Stable.   # Hypocalcemia-  ON adjuvant Zometa q 6 months x 3 years [first- May-17th, 2024].   Continue ca+Vit D BID. Vit D FEB 2024- 54; [albumin 3.0]  # Infusion reaction - ZOmeta [flu like symtoms]- imprived with NSAIDs.   # Diabetes- BG 200 [postbar]- currently on Farxiga 5mg - [OFF ozempic/metformin- intolerance]- Stable.  # mild hypotension: reviewed BP at home- stable.   # Risk of lymphedema- s/p  Maureen evaluation- s/p re-evaluation.   # Hypokalemia: continue Kdur BID.   # PV access: port functional  Lupron 10/22/2022 - q 73M; Zometa q 6 M- #1 10/22/22  # DISPOSITION: # Kadcyla today;  # follow up in 3 week- MD;  port- labs; cbc/cmp; Kadcyla- Dr.B  All questions were answered. The patient/family knows to call the clinic with any problems, questions or concerns.     [name], MD 11/12/2022 10:30 AM

## 2022-11-12 NOTE — Patient Instructions (Signed)
[place] [facility]  Discharge Instructions: Thank you for choosing [facility] to provide your oncology and hematology care.  If you have a lab appointment with the Cancer Center, please go directly to the Cancer Center and check in at the registration area.  Wear comfortable clothing and clothing appropriate for easy access to any Portacath or PICC line.   We strive to give you quality time with your provider. You may need to reschedule your appointment if you arrive late (15 or more minutes).  Arriving late affects you and other patients whose appointments are after yours.  Also, if you miss three or more appointments without notifying the office, you may be dismissed from the clinic at the provider's discretion.      For prescription refill requests, have your pharmacy contact our office and allow 72 hours for refills to be completed.    Today you received the following chemotherapy and/or immunotherapy agents KADCYLA       To help prevent nausea and vomiting after your treatment, we encourage you to take your nausea medication as directed.  BELOW ARE SYMPTOMS THAT SHOULD BE REPORTED IMMEDIATELY: *FEVER GREATER THAN 100.4 F (38 C) OR HIGHER *CHILLS OR SWEATING *NAUSEA AND VOMITING THAT IS NOT CONTROLLED WITH YOUR NAUSEA MEDICATION *UNUSUAL SHORTNESS OF BREATH *UNUSUAL BRUISING OR BLEEDING *URINARY PROBLEMS (pain or burning when urinating, or frequent urination) *BOWEL PROBLEMS (unusual diarrhea, constipation, pain near the anus) TENDERNESS IN MOUTH AND THROAT WITH OR WITHOUT PRESENCE OF ULCERS (sore throat, sores in mouth, or a toothache) UNUSUAL RASH, SWELLING OR PAIN  UNUSUAL VAGINAL DISCHARGE OR ITCHING   Items with * indicate a potential emergency and should be followed up as soon as possible or go to the Emergency Department if any problems should occur.  Please show the CHEMOTHERAPY ALERT CARD or IMMUNOTHERAPY ALERT CARD at check-in to  the Emergency Department and triage nurse.  Should you have questions after your visit or need to cancel or reschedule your appointment, please contact [place] [facility]  [phone] and follow the prompts.  Office hours are 8:00 a.m. to 4:30 p.m. Monday - Friday. Please note that voicemails left after 4:00 p.m. may not be returned until the following business day.  We are closed weekends and major holidays. You have access to a nurse at all times for urgent questions. Please call the main number to the clinic [phone] and follow the prompts.  For any non-urgent questions, you may also contact your provider using MyChart. We now offer e-Visits for anyone 59 and older to request care online for non-urgent symptoms. For details visit [email or web address].   Also download the MyChart app! Go to the app store, search "MyChart", open the app, select [place], and log in with your MyChart username and password.  Ado-Trastuzumab Emtansine Injection What is this medication? ADO-TRASTUZUMAB EMTANSINE (ADD oh traz TOO zuh mab em TAN zine) treats breast cancer. It works by blocking a protein that causes cancer cells to grow and multiply. This helps to slow or stop the spread of cancer cells. This medicine may be used for other purposes; ask your health care provider or pharmacist if you have questions. COMMON BRAND NAME(S): Kadcyla What should I tell my care team before I take this medication? They need to know if you have any of these conditions: Heart failure Liver disease Low platelet levels Lung disease Tingling of the fingers or toes or other nerve disorder An unusual  or allergic reaction to ado-trastuzumab emtansine, other medications, foods, dyes, or preservatives Pregnant or trying to get pregnant Breast-feeding How should I use this medication? This medication is infused into a vein. It is given by your care team in a hospital or clinic setting. Talk to  your care team about the use of this medication in children. Special care may be needed. Overdosage: If you think you have taken too much of this medicine contact a poison control center or emergency room at once. NOTE: This medicine is only for you. Do not share this medicine with others. What if I miss a dose? Keep appointments for follow-up doses. It is important not to miss your dose. Call your care team if you are unable to keep an appointment. What may interact with this medication? Atazanavir Boceprevir Clarithromycin Dalfopristin; quinupristin Delavirdine Indinavir Isoniazid, INH Itraconazole Ketoconazole Nefazodone Nelfinavir Ritonavir Telaprevir Telithromycin Tipranavir Voriconazole This list may not describe all possible interactions. Give your health care provider a list of all the medicines, herbs, non-prescription drugs, or dietary supplements you use. Also tell them if you smoke, drink alcohol, or use illegal drugs. Some items may interact with your medicine. What should I watch for while using this medication? This medication may make you feel generally unwell. This is not uncommon, as chemotherapy can affect healthy cells as well as cancer cells. Report any side effects. Continue your course of treatment even though you feel ill unless your care team tells you to stop. You may need blood work while taking this medication. This medication may increase your risk to bruise or bleed. Call your care team if you notice any unusual bleeding. Be careful brushing or flossing your teeth or using a toothpick because you may get an infection or bleed more easily. If you have any dental work done, tell your dentist you are receiving this medication. Talk to your care team if you may be pregnant. Serious birth defects can occur if you take this medication during pregnancy and for 7 months after the last dose. You will need a negative pregnancy test before starting this medication.  Contraception is recommended while taking this medication and for 7 months after the last dose. Your care team can help you find the option that works for you. If your partner can get pregnant, use a condom during sex while taking this medication and for 4 months after the last dose. Do not breastfeed while taking this medication and for 7 months after the last dose. This medication may cause infertility. Talk to your care team if you are concerned with your fertility. What side effects may I notice from receiving this medication? Side effects that you should report to your care team as soon as possible: Allergic reactions--skin rash, itching, hives, swelling of the face, lips, tongue, or throat Bleeding--bloody or black, tar-like stools, vomiting blood or brown material that looks like coffee grounds, red or dark brown urine, small red or purple spots on skin, unusual bruising or bleeding Dry cough, shortness of breath or trouble breathing Heart failure--shortness of breath, swelling of the ankles, feet, or hands, sudden weight gain, unusual weakness or fatigue Infusion reactions--chest pain, shortness of breath or trouble breathing, feeling faint or lightheaded Liver injury--right upper belly pain, loss of appetite, nausea, light-colored stool, dark yellow or brown urine, yellowing skin or eyes, unusual weakness or fatigue Pain, tingling, or numbness in the hands or feet Painful swelling, warmth, or redness of the skin, blisters or sores at the infusion site  Side effects that usually do not require medical attention (report to your care team if they continue or are bothersome): Constipation Fatigue Headache Muscle pain Nausea This list may not describe all possible side effects. Call your doctor for medical advice about side effects. You may report side effects to FDA at 1-800-FDA-1088. Where should I keep my medication? This medication is given in a hospital or clinic. It will not be stored  at home. NOTE: This sheet is a summary. It may not cover all possible information. If you have questions about this medicine, talk to your doctor, pharmacist, or health care provider.  2024 Elsevier/Gold Standard (2021-10-09 00:00:00)

## 2022-11-16 ENCOUNTER — Ambulatory Visit
Admission: RE | Admit: 2022-11-16 | Discharge: 2022-11-16 | Disposition: A | Discharge status: Home / Self Care | Payer: 59 | Source: Ambulatory Visit | Attending: Internal Medicine | Admitting: Internal Medicine

## 2022-11-16 DIAGNOSIS — C50411 Malignant neoplasm of upper-outer quadrant of right female breast: Secondary | ICD-10-CM | POA: Diagnosis not present

## 2022-11-16 DIAGNOSIS — Z1231 Encounter for screening mammogram for malignant neoplasm of breast: Secondary | ICD-10-CM | POA: Insufficient documentation

## 2022-11-16 DIAGNOSIS — Z170 Estrogen receptor positive status [ER+]: Secondary | ICD-10-CM | POA: Insufficient documentation

## 2022-11-16 HISTORY — DX: Personal history of irradiation: Z92.3

## 2022-12-03 ENCOUNTER — Inpatient Hospital Stay: Payer: 59 | Admitting: Internal Medicine

## 2022-12-03 ENCOUNTER — Encounter: Payer: Self-pay | Admitting: Internal Medicine

## 2022-12-03 ENCOUNTER — Inpatient Hospital Stay: Payer: 59

## 2022-12-03 VITALS — BP 119/77 | HR 79 | Temp 97.5°F | Resp 16 | Ht 66.0 in | Wt 212.0 lb

## 2022-12-03 VITALS — BP 110/59 | HR 63 | Resp 16

## 2022-12-03 DIAGNOSIS — C50411 Malignant neoplasm of upper-outer quadrant of right female breast: Secondary | ICD-10-CM

## 2022-12-03 DIAGNOSIS — Z5112 Encounter for antineoplastic immunotherapy: Secondary | ICD-10-CM | POA: Diagnosis not present

## 2022-12-03 DIAGNOSIS — Z170 Estrogen receptor positive status [ER+]: Secondary | ICD-10-CM

## 2022-12-03 LAB — CBC WITH DIFFERENTIAL/PLATELET
Abs Immature Granulocytes: 0.01 10*3/uL (ref 0.00–0.07)
Basophils Absolute: 0 10*3/uL (ref 0.0–0.1)
Basophils Relative: 1 %
Eosinophils Absolute: 0.1 10*3/uL (ref 0.0–0.5)
Eosinophils Relative: 3 %
HCT: 34.9 % — ABNORMAL LOW (ref 36.0–46.0)
Hemoglobin: 11.5 g/dL — ABNORMAL LOW (ref 12.0–15.0)
Immature Granulocytes: 0 %
Lymphocytes Relative: 28 %
Lymphs Abs: 1.1 10*3/uL (ref 0.7–4.0)
MCH: 27.6 pg (ref 26.0–34.0)
MCHC: 33 g/dL (ref 30.0–36.0)
MCV: 83.9 fL (ref 80.0–100.0)
Monocytes Absolute: 0.3 10*3/uL (ref 0.1–1.0)
Monocytes Relative: 7 %
Neutro Abs: 2.5 10*3/uL (ref 1.7–7.7)
Neutrophils Relative %: 61 %
Platelets: 108 10*3/uL — ABNORMAL LOW (ref 150–400)
RBC: 4.16 MIL/uL (ref 3.87–5.11)
RDW: 15.2 % (ref 11.5–15.5)
WBC: 4 10*3/uL (ref 4.0–10.5)
nRBC: 0 % (ref 0.0–0.2)

## 2022-12-03 LAB — COMPREHENSIVE METABOLIC PANEL
ALT: 26 U/L (ref 0–44)
AST: 32 U/L (ref 15–41)
Albumin: 3.1 g/dL — ABNORMAL LOW (ref 3.5–5.0)
Alkaline Phosphatase: 50 U/L (ref 38–126)
Anion gap: 8 (ref 5–15)
BUN: 12 mg/dL (ref 6–20)
CO2: 26 mmol/L (ref 22–32)
Calcium: 8.6 mg/dL — ABNORMAL LOW (ref 8.9–10.3)
Chloride: 104 mmol/L (ref 98–111)
Creatinine, Ser: 0.62 mg/dL (ref 0.44–1.00)
GFR, Estimated: >60 mL/min (ref >60)
Glucose, Bld: 206 mg/dL — ABNORMAL HIGH (ref 70–99)
Potassium: 3.4 mmol/L — ABNORMAL LOW (ref 3.5–5.1)
Sodium: 138 mmol/L (ref 135–145)
Total Bilirubin: 0.6 mg/dL (ref 0.3–1.2)
Total Protein: 6.8 g/dL (ref 6.5–8.1)

## 2022-12-03 MED ORDER — DIPHENHYDRAMINE HCL 25 MG PO CAPS
25.0000 mg | ORAL_CAPSULE | Freq: Once | ORAL | Status: AC
  Administered 2022-12-03: 25 mg via ORAL
  Filled 2022-12-03: qty 1

## 2022-12-03 MED ORDER — SODIUM CHLORIDE 0.9 % IV SOLN
Freq: Once | INTRAVENOUS | Status: AC
  Filled 2022-12-03: qty 250

## 2022-12-03 MED ORDER — HEPARIN SOD (PORK) LOCK FLUSH 100 UNIT/ML IV SOLN
500.0000 [IU] | Freq: Once | INTRAVENOUS | Status: AC | PRN
  Administered 2022-12-03: 500 [IU]
  Filled 2022-12-03: qty 5

## 2022-12-03 MED ORDER — ACETAMINOPHEN 325 MG PO TABS
650.0000 mg | ORAL_TABLET | Freq: Once | ORAL | Status: AC
  Administered 2022-12-03: 650 mg via ORAL
  Filled 2022-12-03: qty 2

## 2022-12-03 MED ORDER — PROCHLORPERAZINE MALEATE 10 MG PO TABS
10.0000 mg | ORAL_TABLET | Freq: Once | ORAL | Status: AC
  Administered 2022-12-03: 10 mg via ORAL
  Filled 2022-12-03: qty 1

## 2022-12-03 MED ORDER — SODIUM CHLORIDE 0.9 % IV SOLN
3.6000 mg/kg | Freq: Once | INTRAVENOUS | Status: AC
  Administered 2022-12-03: 360 mg via INTRAVENOUS
  Filled 2022-12-03: qty 8

## 2022-12-03 NOTE — Assessment & Plan Note (Addendum)
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  Given partial response/involvement of the breast skin/multifocal disease- s/p RT- 08/27/2022. atient currently on tamoxifen [DEC-JAN 2024]; + OFS- Lupron every 3 months ]. ON Ado-trastuzumab emtansine  for 14 cycles.   # proceed with Ado-trastuzumab emtansine #8 of planned 14 cycles.  MUGA- JAN 2024- Normal LEFT ventricular ejection fraction of 57.1%.  Will repeat the scan after finishing chemotherapy. Stable. ordered left mammogram; MUGA scan -58%   # Mild Thrombocytopenia- > 100- monitor for now.   # Allergic reaction to Ado-trastuzumab emtansine-fever/rash- currently on Claritin x3 days postchemotherapy; Tylenol preemptively.  Decreased Benadryl 25 mg given drowsiness. Stable.   # Hypocalcemia-  ON adjuvant Zometa q 6 months x 3 years [first- May-17th, 2024].   Continue ca+Vit D BID. Vit D FEB 2024- 54; [albumin 3.0]  # Right sided swelling/tightness- lymphedema refer back to [facility]; pt will call Maureen.   # Infusion reaction - ZOmeta [flu like symtoms]- imprived with NSAIDs.   # Diabetes- BG 209 [postbar]- recently increased Farxiga 10 mg- [OFF ozempic/metformin- intolerance]- Stable.  # Right sided lymphedema- s/p  Maureen evaluation-however recommend reevaluation with [name].    # Hypokalemia: continue Kdur BID.   # PV access: port functional  Lupron 10/22/2022 - q 33M; Zometa [over 30 mins] q 6 M- #1 10/22/22   # DISPOSITION: # Kadcyla today;  # follow up in 3 week- MD;  port- labs; cbc/cmp; Kadcyla- Dr.B

## 2022-12-03 NOTE — Patient Instructions (Signed)
[place] [facility] [place] [facility]  Discharge Instructions: Thank you for choosing [facility] to provide your oncology and hematology care.  If you have a lab appointment with the Cancer Center, please go directly to the Cancer Center and check in at the registration area.  Wear comfortable clothing and clothing appropriate for easy access to any Portacath or PICC line.   We strive to give you quality time with your provider. You may need to reschedule your appointment if you arrive late (15 or more minutes).  Arriving late affects you and other patients whose appointments are after yours.  Also, if you miss three or more appointments without notifying the office, you may be dismissed from the clinic at the provider's discretion.      For prescription refill requests, have your pharmacy contact our office and allow 72 hours for refills to be completed.    Today you received the following chemotherapy and/or immunotherapy agents KADCYLA       To help prevent nausea and vomiting after your treatment, we encourage you to take your nausea medication as directed.  BELOW ARE SYMPTOMS THAT SHOULD BE REPORTED IMMEDIATELY: *FEVER GREATER THAN 100.4 F (38 C) OR HIGHER *CHILLS OR SWEATING *NAUSEA AND VOMITING THAT IS NOT CONTROLLED WITH YOUR NAUSEA MEDICATION *UNUSUAL SHORTNESS OF BREATH *UNUSUAL BRUISING OR BLEEDING *URINARY PROBLEMS (pain or burning when urinating, or frequent urination) *BOWEL PROBLEMS (unusual diarrhea, constipation, pain near the anus) TENDERNESS IN MOUTH AND THROAT WITH OR WITHOUT PRESENCE OF ULCERS (sore throat, sores in mouth, or a toothache) UNUSUAL RASH, SWELLING OR PAIN  UNUSUAL VAGINAL DISCHARGE OR ITCHING   Items with * indicate a potential emergency and should be followed up as soon as possible or go to the Emergency Department if any problems should occur.  Please show the CHEMOTHERAPY ALERT CARD or IMMUNOTHERAPY ALERT CARD at check-in to  the Emergency Department and triage nurse.  Should you have questions after your visit or need to cancel or reschedule your appointment, please contact [place] [facility] [place] [facility]  [phone] and follow the prompts.  Office hours are 8:00 a.m. to 4:30 p.m. Monday - Friday. Please note that voicemails left after 4:00 p.m. may not be returned until the following business day.  We are closed weekends and major holidays. You have access to a nurse at all times for urgent questions. Please call the main number to the clinic [phone] and follow the prompts.  For any non-urgent questions, you may also contact your provider using MyChart. We now offer e-Visits for anyone 18 and older to request care online for non-urgent symptoms. For details visit [email or web address].   Also download the MyChart app! Go to the app store, search "MyChart", open the app, select [place], and log in with your MyChart username and password.  Ado-Trastuzumab Emtansine Injection What is this medication? ADO-TRASTUZUMAB EMTANSINE (ADD oh traz TOO zuh mab em TAN zine) treats breast cancer. It works by blocking a protein that causes cancer cells to grow and multiply. This helps to slow or stop the spread of cancer cells. This medicine may be used for other purposes; ask your health care provider or pharmacist if you have questions. COMMON BRAND NAME(S): Kadcyla What should I tell my care team before I take this medication? They need to know if you have any of these conditions: Heart failure Liver disease Low platelet levels Lung disease Tingling of the fingers or toes or other nerve disorder An unusual   or allergic reaction to ado-trastuzumab emtansine, other medications, foods, dyes, or preservatives Pregnant or trying to get pregnant Breast-feeding How should I use this medication? This medication is infused into a vein. It is given by your care team in a hospital or clinic setting. Talk to  your care team about the use of this medication in children. Special care may be needed. Overdosage: If you think you have taken too much of this medicine contact a poison control center or emergency room at once. NOTE: This medicine is only for you. Do not share this medicine with others. What if I miss a dose? Keep appointments for follow-up doses. It is important not to miss your dose. Call your care team if you are unable to keep an appointment. What may interact with this medication? Atazanavir Boceprevir Clarithromycin Dalfopristin; quinupristin Delavirdine Indinavir Isoniazid, INH Itraconazole Ketoconazole Nefazodone Nelfinavir Ritonavir Telaprevir Telithromycin Tipranavir Voriconazole This list may not describe all possible interactions. Give your health care provider a list of all the medicines, herbs, non-prescription drugs, or dietary supplements you use. Also tell them if you smoke, drink alcohol, or use illegal drugs. Some items may interact with your medicine. What should I watch for while using this medication? This medication may make you feel generally unwell. This is not uncommon, as chemotherapy can affect healthy cells as well as cancer cells. Report any side effects. Continue your course of treatment even though you feel ill unless your care team tells you to stop. You may need blood work while taking this medication. This medication may increase your risk to bruise or bleed. Call your care team if you notice any unusual bleeding. Be careful brushing or flossing your teeth or using a toothpick because you may get an infection or bleed more easily. If you have any dental work done, tell your dentist you are receiving this medication. Talk to your care team if you may be pregnant. Serious birth defects can occur if you take this medication during pregnancy and for 7 months after the last dose. You will need a negative pregnancy test before starting this medication.  Contraception is recommended while taking this medication and for 7 months after the last dose. Your care team can help you find the option that works for you. If your partner can get pregnant, use a condom during sex while taking this medication and for 4 months after the last dose. Do not breastfeed while taking this medication and for 7 months after the last dose. This medication may cause infertility. Talk to your care team if you are concerned with your fertility. What side effects may I notice from receiving this medication? Side effects that you should report to your care team as soon as possible: Allergic reactions--skin rash, itching, hives, swelling of the face, lips, tongue, or throat Bleeding--bloody or black, tar-like stools, vomiting blood or brown material that looks like coffee grounds, red or dark brown urine, small red or purple spots on skin, unusual bruising or bleeding Dry cough, shortness of breath or trouble breathing Heart failure--shortness of breath, swelling of the ankles, feet, or hands, sudden weight gain, unusual weakness or fatigue Infusion reactions--chest pain, shortness of breath or trouble breathing, feeling faint or lightheaded Liver injury--right upper belly pain, loss of appetite, nausea, light-colored stool, dark yellow or brown urine, yellowing skin or eyes, unusual weakness or fatigue Pain, tingling, or numbness in the hands or feet Painful swelling, warmth, or redness of the skin, blisters or sores at the infusion site   Side effects that usually do not require medical attention (report to your care team if they continue or are bothersome): Constipation Fatigue Headache Muscle pain Nausea This list may not describe all possible side effects. Call your doctor for medical advice about side effects. You may report side effects to FDA at 1-800-FDA-1088. Where should I keep my medication? This medication is given in a hospital or clinic. It will not be stored  at home. NOTE: This sheet is a summary. It may not cover all possible information. If you have questions about this medicine, talk to your doctor, pharmacist, or health care provider.  2024 Elsevier/Gold Standard (2021-10-09 00:00:00)     

## 2022-12-03 NOTE — Progress Notes (Signed)
[name] NOTE  Patient Care Team: Latanya Maudlin, NP as PCP - General (Family Medicine) Daiva Huge, RN as Oncology Nurse Navigator Cammie Sickle, [name]  CHIEF COMPLAINTS/PURPOSE OF CONSULTATION: Breast cancer  Oncology History Overview Note  MAY 2023-    Targeted ultrasound is performed, showing a 2.9 x 1.9 x 2.0 cm irregular hypoechoic mass right breast 10 o'clock position 6 cm from nipple at the site of palpable concern.   There is an abnormal 8 mm thickened right axillary lymph node.   There is skin thickening overlying the right breast. ------------------  A. BREAST, RIGHT AT 10:00, 6 CM FROM THE NIPPLE; ULTRASOUND-GUIDED CORE  NEEDLE BIOPSY:  - INVASIVE MAMMARY CARCINOMA, NO SPECIAL TYPE.   Size of invasive carcinoma: 11 mm in this sample  Histologic grade of invasive carcinoma: Grade 2                       Glandular/tubular differentiation score: 3                       Nuclear pleomorphism score: 2                       Mitotic rate score: 1                       Total score: 6  Ductal carcinoma in situ: Present, intermediate grade  Lymphovascular invasion: Not identified   B. LYMPH NODE, RIGHT AXILLARY; ULTRASOUND-GUIDED CORE NEEDLE BIOPSY:  - 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 IN GREATEST  EXTENT.   Comment:  The malignancy in the primary breast lesion appears morphologically  different from tumor in the lymph node sampling, with tumor present in  lymph node more suggestive of a histologic grade 3, with significantly  increased mitotic activity and pleomorphism. Due to the discrepancy in  these morphologic patterns, ER/PR/HER2 immunohistochemistry will be  performed on both blocks A1 and B1, with reflex to [place] for HER2 2+. The  results will be reported in an addendum.   ADDENDUM:  CASE SUMMARY: BREAST BIOMARKER TESTS - A - BREAST, RIGHT AT 10:00  Estrogen Receptor  (ER) Status: POSITIVE          Percentage of cells with nuclear positivity: 71-80%          Average intensity of staining: Strong   Progesterone Receptor (PgR) Status: POSITIVE          Percentage of cells with nuclear positivity: 81-90%          Average intensity of staining: Strong   HER2 (by immunohistochemistry): POSITIVE (Score 3+)       Percentage of cells with uniform intense complete membrane  staining: 61-70%  HER2 displays a heterogeneous staining pattern, with areas showing a  strong 3+ staining pattern, and other regions with complete absence (0+)  of staining.   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  Discussed with Dr. [name] tumor heterogeneity. BREAST MRI- JUNE 2023- The patient's known malignancy in the upper outer quadrant of the right breast measures 3.8 x 2.8 x 3.6 cm. Numerous other suspicious masses are identified in the right breast as above involving the upper outer and upper inner quadrants, located both anterior and posterior to the known malignancy.  Numerous enhancing foci in the skin as above are worrisome for skin metastases ; Bx proven. Biopsy proven metastatic node in the right axilla.  No evidence of malignancy in the left breast;  Two mildly prominent right internal mammary nodes were not FDG avid on recent PET-CT imaging.  Currently on neoadjuvant chemotherapy- TCH  plus P x6 cycles;  MUGA scan May 31st, 2023- -61%.    # June 7th, 2023- NEO-ADJUVANT CHEMO- TCH+P x6 cycles- s/p Mastectomy [NOV 15th, 2023] [Dr.Byrnett]-  ypT2 [96mm]; ypN0.  # JAN mid, 2024 -TAMOXFEN 20 mg a day; ADD LupronFEB 2nd.2 2024 s/p RT maech, 22nd- 2024  # FEB 2nd, 2023- Kadcyla q 3 W x14 cycles;   # Lupron q3 M [/start 2/07-2022]   # Genetic counseling s/p- NEG for any deleterious mutations.     Carcinoma of upper-outer quadrant of right breast in female, estrogen receptor positive (HCC)  10/26/2021 Initial Diagnosis   Carcinoma of upper-outer quadrant of right breast in female, estrogen receptor positive (HCC)   10/26/2021 Cancer Staging   Staging form: Breast, AJCC 8th Edition - Clinical: Stage IIIA (cT4d, cN1, cM0, G2, ER+, PR+, HER2+) - Signed by [name], MD on 06/04/2022 Histologic grading system: 3 grade system   11/10/2021 - 03/01/2022 Chemotherapy   Patient is on Treatment Plan : BREAST  Docetaxel + Carboplatin + Trastuzumab + Pertuzumab  (TCHP) q21d      11/11/2021 - 01/13/2022 Chemotherapy   Patient is on Treatment Plan : BREAST  Docetaxel + Carboplatin + Trastuzumab + Pertuzumab  (TCHP) q21d       Genetic Testing   Negative genetic testing. No pathogenic variants identified on the [facility] CancerNext-Expanded+RNA panel. The report date is 11/29/2021.  The CancerNext-Expanded + RNAinsight gene panel offered by [employer] and includes sequencing and rearrangement analysis for the following 77 genes: IP, ALK, APC*, ATM*, AXIN2, BAP1, BARD1, BLM, BMPR1A, BRCA1*, BRCA2*, BRIP1*, CDC73, CDH1*,CDK4, CDKN1B, CDKN2A, CHEK2*, CTNNA1, DICER1, FANCC, FH, FLCN, GALNT12, KIF1B, LZTR1, MAX, MEN1, MET, MLH1*, MSH2*, MSH3, MSH6*, MUTYH*, NBN, NF1*, NF2, NTHL1, PALB2*, PHOX2B, PMS2*, POT1, PRKAR1A, PTCH1, PTEN*, RAD51C*, RAD51D*,RB1, RECQL, RET, SDHA, SDHAF2, SDHB, SDHC, SDHD, SMAD4, SMARCA4, SMARCB1, SMARCE1, STK11, SUFU, TMEM127, TP53*,TSC1, TSC2, VHL and XRCC2 (sequencing  and deletion/duplication); EGFR, EGLN1, HOXB13, KIT, MITF, PDGFRA, POLD1 and POLE (sequencing only); EPCAM and GREM1 (deletion/duplication only).   07/09/2022 -  Chemotherapy   Patient is on Treatment Plan : BREAST ADO-Trastuzumab Emtansine (Kadcyla) q21d      HISTORY OF PRESENTING ILLNESS: Patient is ambulating independently.  Accompanied by her husband.   Marilyn Page 47 y.o.  female right breast TRIPLE POSITIVE with T4- Stage III -s/p mastectomy-with expanders-currently on adjuvant kadcyla / tamoxifen + OFS  is here for follow-up.    Pt having fatigue. Otherwise doing well. Having tenderness when she lays on her Right side under axilla area. Appetite is good.   Having  a weird after taste with certain foods that just started lately. No edema.   Patient oral Potassium.  Patient noted to have an overall improvement in neuropathy and fingernail health.   Review of Systems  Constitutional:  Positive for malaise/fatigue. Negative for chills, diaphoresis, fever and weight loss.  HENT:  Negative for nosebleeds and sore throat.   Eyes:  Negative for double vision.  Respiratory:  Negative for cough, hemoptysis, sputum production, shortness of breath and wheezing.   Cardiovascular:  Negative for chest pain, palpitations, orthopnea and leg swelling.  Gastrointestinal:  Negative for abdominal pain, blood in stool, constipation, diarrhea, heartburn, melena, nausea and vomiting.  Genitourinary:  Negative for dysuria, frequency and urgency.  Musculoskeletal:  Positive for myalgias. Negative for back pain and joint pain.  Skin: Negative.  Negative for itching and rash.  Neurological:  Negative for dizziness, tingling, focal weakness, weakness and headaches.  Endo/Heme/Allergies:  Does not bruise/bleed easily.  Psychiatric/Behavioral:  Negative for depression. The patient is not nervous/anxious and does not have insomnia.      MEDICAL HISTORY:  Past Medical History:  Diagnosis Date   Anemia     Carcinoma of upper-outer quadrant of right breast in female, estrogen receptor positive (HCC) 10/26/2021   Diabetes mellitus without complication (HCC)    Family history of adverse reaction to anesthesia    grandmother had issues but doesnt know what the issue was, she was older.   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 Axilla Butterfly hydro clip-MACRO METASTATIC MAMMARY CARCINOMA   BREAST BIOPSY Right 04/21/2022   [place] RT PLC BREAST LOC DEV   1ST LESION  INC US GUIDE 04/21/2022 [facility]   BREAST RECONSTRUCTION WITH PLACEMENT OF TISSUE EXPANDER AND FLEX HD (ACELLULAR HYDRATED DERMIS) Right 04/21/2022   Procedure: RIGHT BREAST RECONSTRUCTION WITH PLACEMENT OF TISSUE EXPANDER AND FLEX HD (ACELLULAR HYDRATED DERMIS);  Surgeon: Peggye Form, DO;  Location: [facility];  Service: Plastics;  Laterality: Right;   CESAREAN SECTION  2005/2008   DILATION AND CURETTAGE OF UTERUS  2003   PORTACATH PLACEMENT Left 11/09/2021   Procedure: INSERTION PORT-A-CATH;  Surgeon: Earline Mayotte, MD;  Location: [facility];  Service: General;  Laterality: Left;   REMOVAL OF TISSUE EXPANDER AND PLACEMENT OF IMPLANT Right 06/08/2022   Procedure: REMOVAL OF TISSUE EXPANDER;  Surgeon: Peggye Form, DO;  Location: [facility];  Service: Plastics;  Laterality: Right;   SIMPLE MASTECTOMY WITH AXILLARY SENTINEL NODE BIOPSY Right 04/21/2022   Procedure: SIMPLE MASTECTOMY WITH AXILLARY SENTINEL NODE BIOPSY;  Surgeon: Earline Mayotte, MD;  Location: [facility];  Service: General;  Laterality: Right;  wire localization of lymph node   SKIN BIOPSY Right  11/09/2021   Procedure: PUNCH BIOPSY SKIN, TATTOO LYMPH NODE RIGHT AXILLA;  Surgeon: Earline Mayotte, MD;  Location: [facility];  Service: General;  Laterality: Right;   WISDOM TOOTH EXTRACTION      SOCIAL HISTORY: Social History   Socioeconomic History   Marital status: Married    Spouse name: Fraser Din   Number of children: 2   Years of education: Not on file  Highest education level: Not on file  Occupational History   Occupation: and Architectural technologist  Tobacco Use   Smoking status: Never   Smokeless tobacco: Never  Vaping Use   Vaping Use: Never used  Substance and Sexual Activity   Alcohol use: Never   Drug use: Never   Sexual activity: Yes    Birth control/protection: Other-see comments, Surgical    Comment: vasectomy  Other Topics Concern   Not on file  Social History Narrative   Pre-school teacher; in [place]; 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    Social Determinants of Health   Financial Resource Strain: Not on file  Food Insecurity: Not on file  Transportation Needs: Not on file  Physical Activity: Inactive (09/26/2017)   Exercise Vital Sign    Days of Exercise per Week: 0 days    Minutes of Exercise per Session: 0 min  Stress: Not on file  Social Connections: Not on file  Intimate Partner Violence: Not on file    FAMILY HISTORY: Family History  Problem Relation Age of Onset   Ovarian cysts Mother    Migraines Mother    Melanoma Mother        on leg   Hyperlipidemia Father    Diabetes Maternal Aunt    Stomach cancer Paternal Aunt    Diabetes Maternal Grandfather    Lymphoma Cousin 17   Breast cancer Neg Hx     ALLERGIES:  is allergic to metformin and oxycodone.  MEDICATIONS:  Current Outpatient Medications  Medication Sig Dispense Refill   acetaminophen (TYLENOL) 325 MG tablet Take 2 tablets (650 mg total) by mouth every 6 (six) hours as needed for mild pain (or Fever >/= 101).     ascorbic acid (VITAMIN C) 500 MG tablet Take 1 tablet (500 mg total) by  mouth daily. 30 tablet    calcium-vitamin D (OSCAL WITH D) 500-5 MG-MCG tablet Take 1 tablet by mouth 2 (two) times daily. 60 tablet 2   Cyanocobalamin (VITAMIN B-12 PO) Take 1 tablet by mouth daily.     Dapagliflozin Propanediol (FARXIGA PO) Take 10 mg by mouth daily.     ferrous sulfate 325 (65 FE) MG tablet Take 325 mg by mouth 2 (two) times daily with a meal.     lidocaine-prilocaine (EMLA) cream Apply on the port. 30 -45 min  prior to port access. 30 g 3   loratadine (CLARITIN) 10 MG tablet Take 10 mg by mouth as needed for allergies. Day before tx, day of and day after.     Magnesium Cl-Calcium Carbonate (SLOW MAGNESIUM/CALCIUM) 70-117 MG TBEC Take 1 tablet by mouth 2 (two) times daily. 60 tablet 6   Multiple Vitamin (MULTIVITAMIN WITH MINERALS) TABS tablet Take 1 tablet by mouth daily.     ondansetron (ZOFRAN) 4 MG tablet Take 1 tablet (4 mg total) by mouth every 8 (eight) hours as needed for up to 20 doses for nausea or vomiting. 20 tablet 0   ondansetron (ZOFRAN-ODT) 8 MG disintegrating tablet Take 8 mg by mouth every 8 (eight) hours as needed for nausea or vomiting.     ONETOUCH VERIO test strip SMARTSIG:1 Each Via Meter Daily PRN     potassium chloride (KLOR-CON) 20 MEQ packet Take 20 mEq by mouth 2 (two) times daily. 60 packet 3   prochlorperazine (COMPAZINE) 10 MG tablet Take 1 tablet (10 mg total) by mouth every 6 (six) hours as needed for nausea or vomiting. 40 tablet 1   tamoxifen (NOLVADEX) 20 MG tablet  TAKE 1 TABLET BY MOUTH EVERY DAY 90 tablet 1   No current facility-administered medications for this visit.   Facility-Administered Medications Ordered in Other Visits  Medication Dose Route Frequency Provider Last Rate Last Admin   ado-trastuzumab emtansine (KADCYLA) 360 mg in sodium chloride 0.9 % 250 mL chemo infusion  3.6 mg/kg (Treatment Plan Recorded) Intravenous Once Louretta Shorten R, MD       heparin lock flush 100 unit/mL  500 Units Intracatheter Once PRN  [name], MD          .  PHYSICAL EXAMINATION: ECOG PERFORMANCE STATUS: 0 - Asymptomatic  Vitals:   12/03/22 1039  BP: 119/77  Pulse: 79  Resp: 16  Temp: (!) 97.5 F (36.4 C)  SpO2: 97%       Filed Weights   12/03/22 1039  Weight: 212 lb (96.2 kg)    Mild swelling noted in the posteriorly on the right side chest wall.  Physical Exam Vitals and nursing note reviewed.  HENT:     Head: Normocephalic and atraumatic.     Mouth/Throat:     Pharynx: Oropharynx is clear.  Eyes:     Extraocular Movements: Extraocular movements intact.     Pupils: Pupils are equal, round, and reactive to light.  Cardiovascular:     Rate and Rhythm: Normal rate and regular rhythm.  Pulmonary:     Comments: Decreased breath sounds bilaterally.  Abdominal:     Palpations: Abdomen is soft.  Musculoskeletal:        General: Normal range of motion.     Cervical back: Normal range of motion.  Skin:    General: Skin is warm.  Neurological:     General: No focal deficit present.     Mental Status: She is alert and oriented to person, place, and time.  Psychiatric:        Behavior: Behavior normal.        Judgment: Judgment normal.      LABORATORY DATA:  I have reviewed the data as listed Lab Results  Component Value Date   WBC 4.0 12/03/2022   HGB 11.5 (L) 12/03/2022   HCT 34.9 (L) 12/03/2022   MCV 83.9 12/03/2022   PLT 108 (L) 12/03/2022   Recent Labs    12/27/21 2105 12/28/21 0329 10/22/22 0856 11/12/22 0918 12/03/22 0938  NA 134*   < > 139 137 138  K 3.6   < > 3.7 3.4* 3.4*  CL 104   < > 103 104 104  CO2 22   < > 27 27 26   GLUCOSE 192*   < > 176* 148* 206*  BUN 16   < > 13 12 12   CREATININE 0.93   < > 0.61 0.50 0.62  CALCIUM 8.6*   < > 9.0 8.9 8.6*  GFRNONAA >60   < > >60 >60 >60  PROT 7.0   < > 6.9 6.8 6.8  ALBUMIN 3.7   < > 3.2* 3.0* 3.1*  AST 42*   < > 31 30 32  ALT 51*   < > 29 29 26   ALKPHOS 70   < > 50 53 50  BILITOT 1.1   < > 0.4 0.6 0.6   BILIDIR 0.2  --   --   --   --   IBILI 0.9  --   --   --   --    < > = values in this interval not displayed.    RADIOGRAPHIC STUDIES: I  have personally reviewed the radiological images as listed and agreed with the findings in the report. NM Cardiac Muga Rest  Result Date: 11/18/2022 CLINICAL DATA:  Cardiotoxic chemotherapy patient. Assess LV function. EXAM: NUCLEAR MEDICINE CARDIAC BLOOD POOL IMAGING (MUGA) TECHNIQUE: Cardiac multi-gated acquisition was performed at rest following intravenous injection of Tc-60m labeled red blood cells. RADIOPHARMACEUTICALS:  21.73 mCi Tc-109m pertechnetate in-vitro labeled red blood cells IV COMPARISON:  Prior study 07/05/2022 FINDINGS: Normal left ventricular wall motion. No akinetic or dyskinetic segments are identified. The calculated ejection fraction is 58.2%. On the prior study it was 62%. IMPRESSION: Left ventricular ejection fraction is 58%. Electronically Signed   By: Rudie Meyer M.D.   On: 11/18/2022 09:19   MM 3D SCREENING MAMMOGRAM UNILATERAL LEFT BREAST  Result Date: 11/17/2022 CLINICAL DATA:  Screening. EXAM: DIGITAL SCREENING UNILATERAL LEFT MAMMOGRAM WITH CAD AND TOMOSYNTHESIS TECHNIQUE: Left screening digital craniocaudal and mediolateral oblique mammograms were obtained. Left screening digital breast tomosynthesis was performed. The images were evaluated with computer-aided detection. COMPARISON:  Previous exam(s). ACR Breast Density Category c: The breasts are heterogeneously dense, which may obscure small masses. FINDINGS: There are no findings suspicious for malignancy. IMPRESSION: No mammographic evidence of malignancy. A result letter of this screening mammogram will be mailed directly to the patient. RECOMMENDATION: Screening mammogram in one year. (Code:SM-B-01Y) BI-RADS CATEGORY  1: Negative. Electronically Signed   By: Amie Portland M.D.   On: 11/17/2022 16:33    ASSESSMENT & PLAN:   Carcinoma of upper-outer quadrant of right breast  in female, estrogen receptor positive (HCC) # MAY 2023- STAGE III-T4N1 ER/PR positive HER2 POSITIVE RIGHT breast cancer; LN-positive-ER/PR positive her-2 NEGATIVE; # s/p neoadjuvant chemotherapy- TCH plus P x 6 cycles- s/p Right mastectomy-[15th, NOv 2023] ypT2 [10mm]; ypN0.  Given partial response/involvement of the breast skin/multifocal disease- s/p RT- 08/27/2022. atient currently on tamoxifen [DEC-JAN 2024]; + OFS- Lupron every 3 months ]. ON Ado-trastuzumab emtansine  for 14 cycles.   # proceed with Ado-trastuzumab emtansine #8 of planned 14 cycles.  MUGA- JAN 2024- Normal LEFT ventricular ejection fraction of 57.1%.  Will repeat the scan after finishing chemotherapy. Stable. ordered left mammogram; MUGA scan -58%   # Mild Thrombocytopenia- > 100- monitor for now.   # Allergic reaction to Ado-trastuzumab emtansine-fever/rash- currently on Claritin x3 days postchemotherapy; Tylenol preemptively.  Decreased Benadryl 25 mg given drowsiness. Stable.   # Hypocalcemia-  ON adjuvant Zometa q 6 months x 3 years [first- May-17th, 2024].   Continue ca+Vit D BID. Vit D FEB 2024- 54; [albumin 3.0]  # Right sided swelling/tightness- lymphedema refer back to [facility]; pt will call Maureen.   # Infusion reaction - ZOmeta [flu like symtoms]- imprived with NSAIDs.   # Diabetes- BG 209 [postbar]- recently increased Farxiga 10 mg- [OFF ozempic/metformin- intolerance]- Stable.  # Right sided lymphedema- s/p  Maureen evaluation-however recommend reevaluation with [name].    # Hypokalemia: continue Kdur BID.   # PV access: port functional  Lupron 10/22/2022 - q 66M; Zometa [over 30 mins] q 6 M- #1 10/22/22   # DISPOSITION: # Kadcyla today;  # follow up in 3 week- MD;  port- labs; cbc/cmp; Kadcyla- Dr.B  All questions were answered. The patient/family knows to call the clinic with any problems, questions or concerns.     [name], MD 12/03/2022 11:35 AM

## 2022-12-03 NOTE — Progress Notes (Signed)
Pt having fatigue. Otherwise doing well. Having tenderness when she lays on her Right side under axilla area. Appetite is good. Having a weird after taste with certain foods  that just started lately. No edema.

## 2022-12-03 NOTE — Progress Notes (Addendum)
Pt tolerated treatment without complaints.  VSS.  Pt refused 30 minute post observation.  Pt aware of risks.

## 2022-12-24 ENCOUNTER — Inpatient Hospital Stay: Payer: 59 | Attending: Internal Medicine | Admitting: Internal Medicine

## 2022-12-24 ENCOUNTER — Encounter: Payer: Self-pay | Admitting: Internal Medicine

## 2022-12-24 ENCOUNTER — Inpatient Hospital Stay: Payer: 59

## 2022-12-24 VITALS — BP 103/70 | HR 82 | Temp 96.7°F | Ht 66.0 in | Wt 210.4 lb

## 2022-12-24 DIAGNOSIS — E876 Hypokalemia: Secondary | ICD-10-CM | POA: Insufficient documentation

## 2022-12-24 DIAGNOSIS — E119 Type 2 diabetes mellitus without complications: Secondary | ICD-10-CM | POA: Diagnosis not present

## 2022-12-24 DIAGNOSIS — D696 Thrombocytopenia, unspecified: Secondary | ICD-10-CM | POA: Insufficient documentation

## 2022-12-24 DIAGNOSIS — C50411 Malignant neoplasm of upper-outer quadrant of right female breast: Secondary | ICD-10-CM | POA: Insufficient documentation

## 2022-12-24 DIAGNOSIS — Z79810 Long term (current) use of selective estrogen receptor modulators (SERMs): Secondary | ICD-10-CM | POA: Diagnosis not present

## 2022-12-24 DIAGNOSIS — I890 Lymphedema, not elsewhere classified: Secondary | ICD-10-CM | POA: Insufficient documentation

## 2022-12-24 DIAGNOSIS — Z5112 Encounter for antineoplastic immunotherapy: Secondary | ICD-10-CM | POA: Diagnosis present

## 2022-12-24 DIAGNOSIS — C773 Secondary and unspecified malignant neoplasm of axilla and upper limb lymph nodes: Secondary | ICD-10-CM | POA: Insufficient documentation

## 2022-12-24 DIAGNOSIS — Z170 Estrogen receptor positive status [ER+]: Secondary | ICD-10-CM

## 2022-12-24 LAB — COMPREHENSIVE METABOLIC PANEL
ALT: 29 U/L (ref 0–44)
AST: 33 U/L (ref 15–41)
Albumin: 3.2 g/dL — ABNORMAL LOW (ref 3.5–5.0)
Alkaline Phosphatase: 47 U/L (ref 38–126)
Anion gap: 6 (ref 5–15)
BUN: 11 mg/dL (ref 6–20)
CO2: 26 mmol/L (ref 22–32)
Calcium: 8.6 mg/dL — ABNORMAL LOW (ref 8.9–10.3)
Chloride: 106 mmol/L (ref 98–111)
Creatinine, Ser: 0.58 mg/dL (ref 0.44–1.00)
Glucose, Bld: 170 mg/dL — ABNORMAL HIGH (ref 70–99)
Potassium: 3.2 mmol/L — ABNORMAL LOW (ref 3.5–5.1)
Sodium: 138 mmol/L (ref 135–145)
Total Bilirubin: 0.5 mg/dL (ref 0.3–1.2)
Total Protein: 7.1 g/dL (ref 6.5–8.1)

## 2022-12-24 LAB — CBC WITH DIFFERENTIAL/PLATELET
Abs Immature Granulocytes: 0.03 10*3/uL (ref 0.00–0.07)
Basophils Absolute: 0.1 10*3/uL (ref 0.0–0.1)
Basophils Relative: 1 %
Eosinophils Absolute: 0.1 10*3/uL (ref 0.0–0.5)
Eosinophils Relative: 2 %
HCT: 34.8 % — ABNORMAL LOW (ref 36.0–46.0)
Hemoglobin: 11.4 g/dL — ABNORMAL LOW (ref 12.0–15.0)
Immature Granulocytes: 1 %
Lymphocytes Relative: 25 %
Lymphs Abs: 1.4 10*3/uL (ref 0.7–4.0)
MCH: 27.4 pg (ref 26.0–34.0)
MCHC: 32.8 g/dL (ref 30.0–36.0)
MCV: 83.7 fL (ref 80.0–100.0)
Monocytes Absolute: 0.4 10*3/uL (ref 0.1–1.0)
Monocytes Relative: 7 %
Neutro Abs: 3.5 10*3/uL (ref 1.7–7.7)
Neutrophils Relative %: 64 %
Platelets: 108 10*3/uL — ABNORMAL LOW (ref 150–400)
RBC: 4.16 MIL/uL (ref 3.87–5.11)
RDW: 15.3 % (ref 11.5–15.5)
WBC: 5.5 10*3/uL (ref 4.0–10.5)
nRBC: 0 % (ref 0.0–0.2)

## 2022-12-24 MED ORDER — PROCHLORPERAZINE MALEATE 10 MG PO TABS
10.0000 mg | ORAL_TABLET | Freq: Once | ORAL | Status: AC
  Administered 2022-12-24: 10 mg via ORAL

## 2022-12-24 MED ORDER — DIPHENHYDRAMINE HCL 25 MG PO CAPS
25.0000 mg | ORAL_CAPSULE | Freq: Once | ORAL | Status: AC
  Administered 2022-12-24: 25 mg via ORAL

## 2022-12-24 MED ORDER — HEPARIN SOD (PORK) LOCK FLUSH 100 UNIT/ML IV SOLN
500.0000 [IU] | Freq: Once | INTRAVENOUS | Status: AC | PRN
  Administered 2022-12-24: 500 [IU]
  Filled 2022-12-24: qty 5

## 2022-12-24 MED ORDER — ACETAMINOPHEN 325 MG PO TABS
650.0000 mg | ORAL_TABLET | Freq: Once | ORAL | Status: AC
  Administered 2022-12-24: 650 mg via ORAL

## 2022-12-24 MED ORDER — SODIUM CHLORIDE 0.9 % IV SOLN
3.6000 mg/kg | Freq: Once | INTRAVENOUS | Status: AC
  Administered 2022-12-24: 360 mg via INTRAVENOUS
  Filled 2022-12-24: qty 8

## 2022-12-24 MED ORDER — SODIUM CHLORIDE 0.9 % IV SOLN
Freq: Once | INTRAVENOUS | Status: AC
  Filled 2022-12-24: qty 250

## 2022-12-24 NOTE — Assessment & Plan Note (Signed)
#   MAY 2023- STAGE III-T4N1 ER/PR positive HER2 POSITIVE RIGHT breast cancer; LN-positive-ER/PR positive her-2 NEGATIVE; # s/p neoadjuvant chemotherapy- TCH plus P x 6 cycles- s/p Right mastectomy-[15th, NOv 2023] ypT2 [49mm]; ypN0.  Given partial response/involvement of the breast skin/multifocal disease- s/p RT- 08/27/2022. atient currently on tamoxifen [DEC-JAN 2024]; + OFS- Lupron every 3 months ]. ON Ado-trastuzumab emtansine  for 14 cycles.   # proceed with Ado-trastuzumab emtansine #9 of planned 14 cycles.  MUGA- June 13th, 2024- Normal LEFT ventricular ejection fraction -58%   stable. Left mammogram- wnl.   # Mild Thrombocytopenia- > 100- monitor for now.   # Allergic reaction to Ado-trastuzumab emtansine-fever/rash- currently on Claritin x3 days postchemotherapy; Tylenol preemptively.  Decreased Benadryl 25 mg given drowsiness. Stable.   # Hypocalcemia-  ON adjuvant Zometa q 6 months x 3 years [first- May-17th, 2024].   Continue ca+Vit D BID. Vit D FEB 2024- 54; [albumin 3.0]  # Right sided swelling/tightness- lymphedema refer back to [name]- await Maureen.   # Infusion reaction - ZOmeta [flu like symtoms]- imprived with NSAIDs.   # Diabetes- BG 209 [postbar]- recently increased Farxiga 10 mg- [OFF ozempic/metformin- intolerance]- Stable.  # Hypokalemia: continue Kdur BID- Stable.  # PV access: port functional-   Lupron 10/22/2022 - q 84M; Zometa [over 30 mins] q 6 M- #1 10/22/22   # DISPOSITION: # Kadcyla today;  # follow up in 3 week- MD;  port- labs; cbc/cmp; Kadcyla- Dr.B

## 2022-12-24 NOTE — Progress Notes (Signed)
No concerns today 

## 2022-12-24 NOTE — Progress Notes (Signed)
[facility] CONSULT NOTE  [name], [name], [name], [name]  CHIEF COMPLAINTS/PURPOSE OF CONSULTATION: Breast cancer  Oncology History Overview Note  MAY 2023-    Targeted ultrasound is performed, showing a 2.9 x 1.9 x 2.0 cm irregular hypoechoic mass right breast 10 o'clock position 6 cm from nipple at the site of palpable concern.   There is an abnormal 8 mm thickened right axillary lymph node.   There is skin thickening overlying the right breast. ------------------  A. BREAST, RIGHT AT 10:00, 6 CM FROM THE NIPPLE; ULTRASOUND-GUIDED CORE  NEEDLE BIOPSY:  - INVASIVE MAMMARY CARCINOMA, NO SPECIAL TYPE.   Size of invasive carcinoma: 11 mm in this sample  Histologic grade of invasive carcinoma: Grade 2                       Glandular/tubular differentiation score: 3                       Nuclear pleomorphism score: 2                       Mitotic rate score: 1                       Total score: 6  Ductal carcinoma in situ: Present, intermediate grade  Lymphovascular invasion: Not identified   B. LYMPH NODE, RIGHT AXILLARY; ULTRASOUND-GUIDED CORE NEEDLE BIOPSY:  - 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 IN GREATEST  EXTENT.   Comment:  The malignancy in the primary breast lesion appears morphologically  different from tumor in the lymph node sampling, with tumor present in  lymph node more suggestive of a histologic grade 3, with significantly  increased mitotic activity and pleomorphism. Due to the discrepancy in  these morphologic patterns, ER/PR/HER2 immunohistochemistry will be  performed on both blocks A1 and B1, with reflex to FISH for HER2 2+. The  results will be reported in an addendum.   ADDENDUM:  CASE SUMMARY: BREAST BIOMARKER TESTS - A - BREAST, RIGHT AT 10:00  Estrogen Receptor  (ER) Status: POSITIVE          Percentage of cells with nuclear positivity: 71-80%          Average intensity of staining: Strong   Progesterone Receptor (PgR) Status: POSITIVE          Percentage of cells with nuclear positivity: 81-90%          Average intensity of staining: Strong   HER2 (by immunohistochemistry): POSITIVE (Score 3+)       Percentage of cells with uniform intense complete membrane  staining: 61-70%  HER2 displays a heterogeneous staining pattern, with areas showing a  strong 3+ staining pattern, and other regions with complete absence (0+)  of staining.   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  Discussed with Dr. [name] tumor heterogeneity. BREAST MRI- JUNE 2023- The patient's known malignancy in the upper outer quadrant of the right breast measures 3.8 x 2.8 x 3.6 cm. Numerous other suspicious masses are identified in the right breast as above involving the upper outer and upper inner quadrants, located both anterior and posterior to the known malignancy.  Numerous enhancing foci in the skin as above are worrisome for skin metastases ; Bx proven. Biopsy proven metastatic node in the right axilla.  No evidence of malignancy in the left breast;  Two mildly prominent right internal mammary nodes were not FDG avid on recent PET-CT imaging.  Currently on neoadjuvant chemotherapy- TCH  plus P x6 cycles;  MUGA scan May 31st, 2023- -61%.    # June 7th, 2023- NEO-ADJUVANT CHEMO- TCH+P x6 cycles- s/p Mastectomy [NOV 15th, 2023] [Dr.Byrnett]-  ypT2 [16mm]; ypN0.  # JAN mid, 2024 -TAMOXFEN 20 mg a day; ADD LupronFEB 2nd.2 2024 s/p RT maech, 22nd- 2024  # FEB 2nd, 2023- Kadcyla q 3 W x14 cycles;   # Lupron q3 M [/start 2/07-2022]   # Genetic counseling s/p- NEG for any deleterious mutations.     Carcinoma of upper-outer quadrant of right breast in female, estrogen receptor positive (HCC)  10/26/2021 Initial Diagnosis   Carcinoma of upper-outer quadrant of right breast in female, estrogen receptor positive (HCC)   10/26/2021 Cancer Staging   Staging form: Breast, AJCC 8th Edition - Clinical: Stage IIIA (cT4d, cN1, cM0, G2, ER+, PR+, HER2+) - Signed by [name], MD on 06/04/2022 Histologic grading system: 3 grade system   11/10/2021 - 03/01/2022 Chemotherapy   Patient is on Treatment Plan : BREAST  Docetaxel + Carboplatin + Trastuzumab + Pertuzumab  (TCHP) q21d      11/11/2021 - 01/13/2022 Chemotherapy   Patient is on Treatment Plan : BREAST  Docetaxel + Carboplatin + Trastuzumab + Pertuzumab  (TCHP) q21d       Genetic Testing   Negative genetic testing. No pathogenic variants identified on the [facility] CancerNext-Expanded+RNA panel. The report date is 11/29/2021.  The CancerNext-Expanded + RNAinsight gene panel offered by [employer] and includes sequencing and rearrangement analysis for the following 77 genes: IP, ALK, APC*, ATM*, AXIN2, BAP1, BARD1, BLM, BMPR1A, BRCA1*, BRCA2*, BRIP1*, CDC73, CDH1*,CDK4, CDKN1B, CDKN2A, CHEK2*, CTNNA1, DICER1, FANCC, FH, FLCN, GALNT12, KIF1B, LZTR1, MAX, MEN1, MET, MLH1*, MSH2*, MSH3, MSH6*, MUTYH*, NBN, NF1*, NF2, NTHL1, PALB2*, PHOX2B, PMS2*, POT1, PRKAR1A, PTCH1, PTEN*, RAD51C*, RAD51D*,RB1, RECQL, RET, SDHA, SDHAF2, SDHB, SDHC, SDHD, SMAD4, SMARCA4, SMARCB1, SMARCE1, STK11, SUFU, TMEM127, TP53*,TSC1, TSC2, VHL and XRCC2 (sequencing  and deletion/duplication); EGFR, EGLN1, HOXB13, KIT, MITF, PDGFRA, POLD1 and POLE (sequencing only); EPCAM and GREM1 (deletion/duplication only).   07/09/2022 -  Chemotherapy   Patient is on Treatment Plan : BREAST ADO-Trastuzumab Emtansine (Kadcyla) q21d      HISTORY OF PRESENTING ILLNESS: Patient is ambulating independently.  Accompanied by her husband.   Marilyn Page 47 y.o.  female right breast TRIPLE POSITIVE with T4- Stage III -s/p mastectomy-with expanders-currently on adjuvant kadcyla / tamoxifen + OFS  is here for follow-up.    Pt had one episode of dizzyness while going on the escalator;  No syncope or falls. Currently resolved.   Continues to have mild  tightness of right chest wall. Not any significantly worse.  Patient oral Potassium.  Mild  neuropathy.   Review of Systems  Constitutional:  Positive for malaise/fatigue. Negative for chills, diaphoresis, fever and weight loss.  HENT:  Negative for nosebleeds and sore throat.   Eyes:  Negative for double vision.  Respiratory:  Negative for cough, hemoptysis, sputum production, shortness of breath and wheezing.   Cardiovascular:  Negative for chest pain, palpitations, orthopnea and leg swelling.  Gastrointestinal:  Negative for abdominal pain, blood in stool, constipation, diarrhea, heartburn, melena, nausea and vomiting.  Genitourinary:  Negative for dysuria, frequency and urgency.  Musculoskeletal:  Positive for myalgias. Negative for back pain and joint pain.  Skin: Negative.  Negative for itching and rash.  Neurological:  Negative for dizziness, tingling, focal weakness, weakness and headaches.  Endo/Heme/Allergies:  Does not bruise/bleed easily.  Psychiatric/Behavioral:  Negative for depression. The patient is not nervous/anxious and does not have insomnia.      MEDICAL HISTORY:  Past Medical History:  Diagnosis Date   Anemia    Carcinoma of upper-outer quadrant of right breast in female, estrogen receptor positive  (HCC) 10/26/2021   Diabetes mellitus without complication (HCC)    Family history of adverse reaction to anesthesia    grandmother had issues but doesnt know what the issue was, she was older.   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 Axilla Butterfly hydro clip-MACRO METASTATIC MAMMARY CARCINOMA   BREAST BIOPSY Right 04/21/2022   [place] RT PLC BREAST LOC DEV   1ST LESION  INC US GUIDE 04/21/2022 [facility]   BREAST RECONSTRUCTION WITH PLACEMENT OF TISSUE EXPANDER AND FLEX HD (ACELLULAR HYDRATED DERMIS) Right 04/21/2022   Procedure: RIGHT BREAST RECONSTRUCTION WITH PLACEMENT OF TISSUE EXPANDER AND FLEX HD (ACELLULAR HYDRATED DERMIS);  Surgeon: Peggye Form, DO;  Location: [facility] ORS;  Service: Plastics;  Laterality: Right;   CESAREAN SECTION  2005/2008   DILATION AND CURETTAGE OF UTERUS  2003   PORTACATH PLACEMENT Left 11/09/2021   Procedure: INSERTION PORT-A-CATH;  Surgeon: Earline Mayotte, MD;  Location: [facility];  Service: General;  Laterality: Left;   REMOVAL OF TISSUE EXPANDER AND PLACEMENT OF IMPLANT Right 06/08/2022   Procedure: REMOVAL OF TISSUE EXPANDER;  Surgeon: Peggye Form, DO;  Location: [facility];  Service: Plastics;  Laterality: Right;   SIMPLE MASTECTOMY WITH AXILLARY SENTINEL NODE BIOPSY Right 04/21/2022   Procedure: SIMPLE MASTECTOMY WITH AXILLARY SENTINEL NODE BIOPSY;  Surgeon: Earline Mayotte, MD;  Location: [facility];  Service: General;  Laterality: Right;  wire localization of lymph node   SKIN BIOPSY Right 11/09/2021   Procedure: PUNCH BIOPSY SKIN, TATTOO LYMPH NODE RIGHT AXILLA;  Surgeon: Earline Mayotte, MD;  Location: [facility];  Service: General;  Laterality: Right;   WISDOM TOOTH EXTRACTION      SOCIAL HISTORY: Social History   Socioeconomic History   Marital status: Married    Spouse name: Fraser Din   Number of children: 2   Years of education: Not on file   Highest education level: Not on file  Occupational History   Occupation: and teacher's  assistant  Tobacco Use   Smoking status: Never   Smokeless tobacco: Never  Vaping Use   Vaping status: Never Used  Substance and Sexual Activity   Alcohol use: Never   Drug use: Never   Sexual activity: Yes    Birth control/protection: Other-see comments, Surgical    Comment: vasectomy  Other Topics Concern   Not on file  Social History Narrative   Pre-school teacher; in [place]; 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    Social Determinants of Health   Financial Resource Strain: Not on file  Food Insecurity: Not on file  Transportation Needs: Not on file  Physical Activity: Inactive (09/26/2017)   Exercise Vital Sign    Days of Exercise per Week: 0 days    Minutes of Exercise per Session: 0 min  Stress: Not on file  Social Connections: Not on file  Intimate Partner Violence: Not on file    FAMILY HISTORY: Family History  Problem Relation Age of Onset   Ovarian cysts Mother    Migraines Mother    Melanoma Mother        on leg   Hyperlipidemia Father    Diabetes Maternal Aunt    Stomach cancer Paternal Aunt    Diabetes Maternal Grandfather    Lymphoma Cousin 17   Breast cancer Neg Hx     ALLERGIES:  is allergic to metformin and oxycodone.  MEDICATIONS:  Current Outpatient Medications  Medication Sig Dispense Refill   acetaminophen (TYLENOL) 325 MG tablet Take 2 tablets (650 mg total) by mouth every 6 (six) hours as needed for mild pain (or Fever >/= 101).     ascorbic acid (VITAMIN C) 500 MG tablet Take 1 tablet (500 mg total) by mouth daily. 30 tablet    calcium-vitamin D (OSCAL WITH D) 500-5 MG-MCG tablet Take 1  tablet by mouth 2 (two) times daily. 60 tablet 2   Cyanocobalamin (VITAMIN B-12 PO) Take 1 tablet by mouth daily.     Dapagliflozin Propanediol (FARXIGA PO) Take 10 mg by mouth daily.     ferrous sulfate 325 (65 FE) MG tablet Take 325 mg by mouth 2 (two) times daily with a meal.     lidocaine-prilocaine (EMLA) cream Apply on the port. 30 -45 min  prior to port access. 30 g 3   loratadine (CLARITIN) 10 MG tablet Take 10 mg by mouth as needed for allergies. Day before tx, day of and day after.     Magnesium Cl-Calcium Carbonate (SLOW MAGNESIUM/CALCIUM) 70-117 MG TBEC Take 1 tablet by mouth 2 (two) times daily. 60 tablet 6   Multiple Vitamin (MULTIVITAMIN WITH MINERALS) TABS tablet Take 1 tablet by mouth daily.     ondansetron (ZOFRAN) 4 MG tablet Take 1 tablet (4 mg total) by mouth every 8 (eight) hours as needed for up to 20 doses for nausea or vomiting. 20 tablet 0   ondansetron (ZOFRAN-ODT) 8 MG disintegrating tablet Take 8 mg by mouth every 8 (eight) hours as needed for nausea or vomiting.     ONETOUCH VERIO test strip SMARTSIG:1 Each Via Meter Daily PRN     potassium chloride (KLOR-CON) 20 MEQ packet Take 20 mEq by mouth 2 (two) times daily. 60 packet 3   prochlorperazine (COMPAZINE) 10 MG tablet Take 1 tablet (10 mg total) by mouth every 6 (six) hours as needed for nausea or vomiting. 40 tablet 1   tamoxifen (NOLVADEX) 20 MG tablet TAKE 1 TABLET BY MOUTH EVERY DAY 90 tablet 1   No current  facility-administered medications for this visit.   Facility-Administered Medications Ordered in Other Visits  Medication Dose Route Frequency Provider Last Rate Last Admin   acetaminophen (TYLENOL) tablet 650 mg  650 mg Oral Once [name], MD       ado-trastuzumab emtansine (KADCYLA) 360 mg in sodium chloride 0.9 % 250 mL chemo infusion  3.6 mg/kg (Treatment Plan Recorded) Intravenous Once [name], MD       diphenhydrAMINE (BENADRYL) capsule 25 mg  25 mg Oral Once [name], MD       prochlorperazine (COMPAZINE) tablet 10 mg  10 mg Oral Once [name], MD          .  PHYSICAL EXAMINATION: ECOG PERFORMANCE STATUS: 0 - Asymptomatic  Vitals:   12/24/22 1043  BP: 103/70  Pulse: 82  Temp: (!) 96.7 F (35.9 C)  SpO2: 99%       Filed Weights   12/24/22 1043  Weight: 210 lb 6.4 oz (95.4 kg)    Mild swelling noted in the posteriorly on the right side chest wall.  Physical Exam Vitals and nursing note reviewed.  HENT:     Head: Normocephalic and atraumatic.     Mouth/Throat:     Pharynx: Oropharynx is clear.  Eyes:     Extraocular Movements: Extraocular movements intact.     Pupils: Pupils are equal, round, and reactive to light.  Cardiovascular:     Rate and Rhythm: Normal rate and regular rhythm.  Pulmonary:     Comments: Decreased breath sounds bilaterally.  Abdominal:     Palpations: Abdomen is soft.  Musculoskeletal:        General: Normal range of motion.     Cervical back: Normal range of motion.  Skin:    General: Skin is warm.  Neurological:     General: No focal deficit present.     Mental Status: She is alert and oriented to person, place, and time.  Psychiatric:        Behavior: Behavior normal.        Judgment: Judgment normal.      LABORATORY DATA:  I have reviewed the data as listed Lab Results  Component Value Date   WBC 5.5 12/24/2022   HGB 11.4 (L) 12/24/2022   HCT 34.8 (L) 12/24/2022   MCV 83.7 12/24/2022   PLT 108 (L) 12/24/2022   Recent Labs    12/27/21 2105 12/28/21 0329 11/12/22 0918 12/03/22 0938 12/24/22 1045  NA 134*   < > 137 138 138  K 3.6   < > 3.4* 3.4* 3.2*  CL 104   < > 104 104 106  CO2 22   < > 27 26 26   GLUCOSE 192*   < > 148* 206* 170*  BUN 16   < > 12 12 11   CREATININE 0.93   < > 0.50 0.62 0.58  CALCIUM 8.6*   < > 8.9 8.6* 8.6*  GFRNONAA >60   < > >60 >60 PENDING  GFRAA  --   --   --   --  PENDING  PROT 7.0   < > 6.8 6.8 7.1  ALBUMIN 3.7   < > 3.0* 3.1*  3.2*  AST 42*   < > 30 32 33  ALT 51*   < > 29 26 29   ALKPHOS 70   < > 53 50 47  BILITOT 1.1   < > 0.6 0.6 0.5  BILIDIR 0.2  --   --   --   --  IBILI 0.9  --   --   --   --    < > = values in this interval not displayed.    RADIOGRAPHIC STUDIES: I have personally reviewed the radiological images as listed and agreed with the findings in the report. No results found.  ASSESSMENT & PLAN:   Carcinoma of upper-outer quadrant of right breast in female, estrogen receptor positive (HCC) # MAY 2023- STAGE III-T4N1 ER/PR positive HER2 POSITIVE RIGHT breast cancer; LN-positive-ER/PR positive her-2 NEGATIVE; # s/p neoadjuvant chemotherapy- TCH plus P x 6 cycles- s/p Right mastectomy-[15th, NOv 2023] ypT2 [9mm]; ypN0.  Given partial response/involvement of the breast skin/multifocal disease- s/p RT- 08/27/2022. atient currently on tamoxifen [DEC-JAN 2024]; + OFS- Lupron every 3 months ]. ON Ado-trastuzumab emtansine  for 14 cycles.   # proceed with Ado-trastuzumab emtansine #9 of planned 14 cycles.  MUGA- June 13th, 2024- Normal LEFT ventricular ejection fraction -58%   stable. Left mammogram- wnl.   # Mild Thrombocytopenia- > 100- monitor for now.   # Allergic reaction to Ado-trastuzumab emtansine-fever/rash- currently on Claritin x3 days postchemotherapy; Tylenol preemptively.  Decreased Benadryl 25 mg given drowsiness. Stable.   # Hypocalcemia-  ON adjuvant Zometa q 6 months x 3 years [first- May-17th, 2024].   Continue ca+Vit D BID. Vit D FEB 2024- 54; [albumin 3.0]  # Right sided swelling/tightness- lymphedema refer back to [name]- await Maureen.   # Infusion reaction - ZOmeta [flu like symtoms]- imprived with NSAIDs.   # Diabetes- BG 209 [postbar]- recently increased Farxiga 10 mg- [OFF ozempic/metformin- intolerance]- Stable.  # Hypokalemia: continue Kdur BID- Stable.  # PV access: port functional-   Lupron 10/22/2022 - q 5M; Zometa [over 30 mins] q 6 M- #1 10/22/22   #  DISPOSITION: # Kadcyla today;  # follow up in 3 week- MD;  port- labs; cbc/cmp; Kadcyla- Dr.B  All questions were answered. The patient/family knows to call the clinic with any problems, questions or concerns.     [name], MD 12/24/2022 11:32 AM

## 2022-12-24 NOTE — Patient Instructions (Signed)

## 2023-01-05 ENCOUNTER — Inpatient Hospital Stay: Payer: 59 | Admitting: Occupational Therapy

## 2023-01-05 DIAGNOSIS — M25611 Stiffness of right shoulder, not elsewhere classified: Secondary | ICD-10-CM

## 2023-01-05 NOTE — Therapy (Signed)
[facility] [address], [address] [place], [place], [address] Phone: [phone]   Fax:  [phone]  Occupational Therapy Screen  Patient Details  Name: Marilyn Page MRN: [id number] Date of Birth: [birth year] No data recorded  Encounter Date: 01/05/2023   OT End of Session - 01/05/23 1022     Visit Number 0             Past Medical History:  Diagnosis Date   Anemia    Carcinoma of upper-outer quadrant of right breast in female, estrogen receptor positive (HCC) 10/26/2021   Diabetes mellitus without complication (HCC)    Family history of adverse reaction to anesthesia    grandmother had issues but doesnt know what the issue was, she was older.   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 Axilla Butterfly hydro clip-MACRO METASTATIC MAMMARY CARCINOMA   BREAST BIOPSY Right 04/21/2022   [place] RT PLC BREAST LOC DEV   1ST LESION  INC US GUIDE 04/21/2022 [facility]   BREAST RECONSTRUCTION WITH PLACEMENT OF TISSUE EXPANDER AND FLEX HD (ACELLULAR HYDRATED DERMIS) Right 04/21/2022   Procedure: RIGHT BREAST RECONSTRUCTION WITH PLACEMENT OF TISSUE EXPANDER AND FLEX HD (ACELLULAR HYDRATED DERMIS);  Surgeon: Peggye Form, DO;  Location: [facility];  Service: Plastics;  Laterality: Right;   CESAREAN SECTION  2005/2008   DILATION AND CURETTAGE OF UTERUS  2003   PORTACATH PLACEMENT Left 11/09/2021   Procedure: INSERTION PORT-A-CATH;  Surgeon: Earline Mayotte, MD;  Location: [facility];  Service: General;  Laterality: Left;   REMOVAL OF TISSUE EXPANDER AND PLACEMENT OF IMPLANT Right 06/08/2022    Procedure: REMOVAL OF TISSUE EXPANDER;  Surgeon: Peggye Form, DO;  Location: [facility];  Service: Plastics;  Laterality: Right;   SIMPLE MASTECTOMY WITH AXILLARY SENTINEL NODE BIOPSY Right 04/21/2022   Procedure: SIMPLE MASTECTOMY WITH AXILLARY SENTINEL NODE BIOPSY;  Surgeon: Earline Mayotte, MD;  Location: [facility];  Service: General;  Laterality: Right;  wire localization of lymph node   SKIN BIOPSY Right 11/09/2021   Procedure: PUNCH BIOPSY SKIN, TATTOO LYMPH NODE RIGHT AXILLA;  Surgeon: Earline Mayotte, MD;  Location: [facility];  Service: General;  Laterality: Right;   WISDOM TOOTH EXTRACTION      There were no vitals filed for this visit.   Subjective Assessment - 01/05/23 1020     Subjective  I am doing much better- but DR B thought for me to see you about swelling on my R side of thoracic - bother me at night time -shoulder doing okay -but tight over the chest and when I stretch over head - feel pull in my upper arm    Currently in Pain? Yes    Pain Score 2     Pain Location Chest    Pain Orientation Right    Pain Descriptors / Indicators Tightness    Pain Type Surgical pain    Pain Onset More than a month ago                 LYMPHEDEMA/ONCOLOGY QUESTIONNAIRE - 01/05/23 0001  Right Upper Extremity Lymphedema   15 cm Proximal to Olecranon Process 38 cm    10 cm Proximal to Olecranon Process 35 cm    Olecranon Process 29 cm      Left Upper Extremity Lymphedema   15 cm Proximal to Olecranon Process 39.5 cm    10 cm Proximal to Olecranon Process 36 cm    Olecranon Process 29.2 cm              OT SCREEN 3/20/2       Pt arrive after being seen in Dec 23 last time - since then had L expander removed  because of infection and radiation she has 3 more sessions left- some blistering in axilla and limiting her shoulder ABD more than flexion on L UE. Pt to keep ABD to about 90 degrees- and flexion in pain free motion.  Cannot do any manaul therapy  until 4-6 wks after radiation Pt circumference in bilateral UE compare to WNL  Pt with L shoulder rounder and forward - lower than R - pt to do several times during day scapula retraction and chin tug Pt with tight and tenderness over L upper trap - over use  Pt to do upper trap stretch  Follow up with me again after done with radiation and blisters are healed.         OT SCREEN 10/20/22: Patient arrived being seen 8 weeks ago.  Patient done with radiation.  Active shoulder range of motion increased but still continues to have tightness in right axilla and anterior shoulder over Pect. Reviewed with patient child pose stretches for Lats and  shoulder flexion Reminded patient to continue with her pulleys for shoulder flexion and abduction active assisted range of motion-per patient stopped doing it. 20 reps each Provided patient with red Thera-Band for scapular squeezes as well as shoulder extension 2 sets of 12 2 times a day Continue to focus on posture with retraction of right shoulder. Patient to have a follow-up appointment with Dr. Ulice Bold to decide about reconstruction. Patient to contact me if needed when to follow-up.     OT SCREEN 01/05/23: Patient referred for by Dr. B for swelling on the right thoracic.  Patient reports mostly bothering her with sleeping.  Light compression camisole felt better but still not pain-free.  Also some discomfort with shoulder range of motion overhead over lateral deltoid.  Tightness over pec muscle. Patient posture improved greatly since last seen.  Bilateral upper extremity circumference within normal limits compared to each other. Recommended for patient to get a unilateral postmastectomy Jovi pack to wear at nighttime as well as during the day on her camisole for about 3 weeks to clear up some lymphedema on the thoracic on the right. Also reviewed with patient stretches to focus on the tightness in the Pect and Lats muscle. Shoulder abduction and  flexion in supine with rotation of the hips to the left. Continue with child's pose. As well as added for during the day at work doing active assisted range of motion for shoulder flexion abduction on the table. Patient overusing deltoid and upper trap on the right for overcoming the tightness in the chest and axilla on the right.  Patient to follow-up with me in 3 weeks.                                  Visit Diagnosis: Stiffness of right shoulder, not  elsewhere classified    Problem List Patient Active Problem List   Diagnosis Date Noted   Acquired absence of right breast 06/18/2022   Low vitamin B12 level 05/24/2022   Hypomagnesemia 05/16/2022   Lactic acid acidosis 03/06/2022   Fever and chills 03/05/2022   COVID-19 virus infection 12/28/2021   Thrombocytopenia (HCC) 12/28/2021   Diabetes mellitus (HCC) 12/28/2021   Febrile neutropenia (HCC) 12/27/2021   Sepsis (HCC) 12/27/2021   Genetic testing 12/01/2021   Carcinoma of upper-outer quadrant of right breast in female, estrogen receptor positive (HCC) 10/26/2021   Uterus, adenomyosis 05/14/2021   ASCUS of cervix with negative high risk HPV 04/23/2021   Iron deficiency anemia due to chronic blood loss 04/23/2021   Type 2 diabetes mellitus without complication, without long-term current use of insulin (HCC) 05/18/2020   RLS (restless legs syndrome) 05/14/2020   Menorrhagia with regular cycle 10/14/2017   BMI 37.0-37.9, adult 10/03/2017   History of gestational diabetes 10/03/2017    Oletta Cohn, OTR/L,CLT 01/05/2023, 10:24 AM  [facility] [address] [place], [place], [address] Phone: [phone]   Fax:  [phone]  Name: Marilyn Page MRN: [id number] Date of Birth: November 09, 1975

## 2023-01-14 ENCOUNTER — Inpatient Hospital Stay: Payer: 59 | Attending: Internal Medicine | Admitting: Internal Medicine

## 2023-01-14 ENCOUNTER — Encounter: Payer: Self-pay | Admitting: Internal Medicine

## 2023-01-14 ENCOUNTER — Inpatient Hospital Stay: Payer: 59

## 2023-01-14 ENCOUNTER — Inpatient Hospital Stay: Payer: 59 | Attending: Internal Medicine

## 2023-01-14 VITALS — BP 114/51 | HR 69

## 2023-01-14 VITALS — BP 116/78 | HR 76 | Temp 98.8°F | Ht 66.0 in | Wt 210.0 lb

## 2023-01-14 DIAGNOSIS — C50411 Malignant neoplasm of upper-outer quadrant of right female breast: Secondary | ICD-10-CM | POA: Insufficient documentation

## 2023-01-14 DIAGNOSIS — Z79810 Long term (current) use of selective estrogen receptor modulators (SERMs): Secondary | ICD-10-CM | POA: Insufficient documentation

## 2023-01-14 DIAGNOSIS — Z170 Estrogen receptor positive status [ER+]: Secondary | ICD-10-CM

## 2023-01-14 DIAGNOSIS — C773 Secondary and unspecified malignant neoplasm of axilla and upper limb lymph nodes: Secondary | ICD-10-CM | POA: Insufficient documentation

## 2023-01-14 DIAGNOSIS — E876 Hypokalemia: Secondary | ICD-10-CM

## 2023-01-14 DIAGNOSIS — E119 Type 2 diabetes mellitus without complications: Secondary | ICD-10-CM | POA: Insufficient documentation

## 2023-01-14 DIAGNOSIS — Z5112 Encounter for antineoplastic immunotherapy: Secondary | ICD-10-CM | POA: Diagnosis present

## 2023-01-14 DIAGNOSIS — D696 Thrombocytopenia, unspecified: Secondary | ICD-10-CM | POA: Diagnosis not present

## 2023-01-14 LAB — CBC WITH DIFFERENTIAL/PLATELET
Abs Immature Granulocytes: 0.01 10*3/uL (ref 0.00–0.07)
Basophils Absolute: 0 10*3/uL (ref 0.0–0.1)
Basophils Relative: 1 %
Eosinophils Absolute: 0.1 10*3/uL (ref 0.0–0.5)
Eosinophils Relative: 2 %
HCT: 36.2 % (ref 36.0–46.0)
Hemoglobin: 11.5 g/dL — ABNORMAL LOW (ref 12.0–15.0)
Immature Granulocytes: 0 %
Lymphocytes Relative: 21 %
Lymphs Abs: 1 10*3/uL (ref 0.7–4.0)
MCH: 26.9 pg (ref 26.0–34.0)
MCHC: 31.8 g/dL (ref 30.0–36.0)
MCV: 84.6 fL (ref 80.0–100.0)
Monocytes Absolute: 0.3 10*3/uL (ref 0.1–1.0)
Monocytes Relative: 7 %
Neutro Abs: 3.2 10*3/uL (ref 1.7–7.7)
Neutrophils Relative %: 69 %
Platelets: 102 10*3/uL — ABNORMAL LOW (ref 150–400)
RBC: 4.28 MIL/uL (ref 3.87–5.11)
RDW: 15.9 % — ABNORMAL HIGH (ref 11.5–15.5)
WBC: 4.6 10*3/uL (ref 4.0–10.5)
nRBC: 0 % (ref 0.0–0.2)

## 2023-01-14 LAB — COMPREHENSIVE METABOLIC PANEL
ALT: 34 U/L (ref 0–44)
AST: 39 U/L (ref 15–41)
Albumin: 3.2 g/dL — ABNORMAL LOW (ref 3.5–5.0)
Alkaline Phosphatase: 47 U/L (ref 38–126)
Anion gap: 6 (ref 5–15)
BUN: 12 mg/dL (ref 6–20)
CO2: 25 mmol/L (ref 22–32)
Calcium: 8.6 mg/dL — ABNORMAL LOW (ref 8.9–10.3)
Chloride: 106 mmol/L (ref 98–111)
Creatinine, Ser: 0.6 mg/dL (ref 0.44–1.00)
GFR, Estimated: >60 mL/min (ref >60)
Glucose, Bld: 159 mg/dL — ABNORMAL HIGH (ref 70–99)
Potassium: 3.4 mmol/L — ABNORMAL LOW (ref 3.5–5.1)
Sodium: 137 mmol/L (ref 135–145)
Total Bilirubin: 0.6 mg/dL (ref 0.3–1.2)
Total Protein: 7.1 g/dL (ref 6.5–8.1)

## 2023-01-14 MED ORDER — HEPARIN SOD (PORK) LOCK FLUSH 100 UNIT/ML IV SOLN
500.0000 [IU] | Freq: Once | INTRAVENOUS | Status: AC | PRN
  Administered 2023-01-14: 500 [IU]
  Filled 2023-01-14: qty 5

## 2023-01-14 MED ORDER — ACETAMINOPHEN 325 MG PO TABS
650.0000 mg | ORAL_TABLET | Freq: Once | ORAL | Status: AC
  Administered 2023-01-14: 650 mg via ORAL
  Filled 2023-01-14: qty 2

## 2023-01-14 MED ORDER — PROCHLORPERAZINE MALEATE 10 MG PO TABS
10.0000 mg | ORAL_TABLET | Freq: Once | ORAL | Status: AC
  Administered 2023-01-14: 10 mg via ORAL
  Filled 2023-01-14: qty 1

## 2023-01-14 MED ORDER — SODIUM CHLORIDE 0.9 % IV SOLN
3.6000 mg/kg | Freq: Once | INTRAVENOUS | Status: AC
  Administered 2023-01-14: 360 mg via INTRAVENOUS
  Filled 2023-01-14: qty 8

## 2023-01-14 MED ORDER — DIPHENHYDRAMINE HCL 25 MG PO CAPS
25.0000 mg | ORAL_CAPSULE | Freq: Once | ORAL | Status: AC
  Administered 2023-01-14: 25 mg via ORAL
  Filled 2023-01-14: qty 1

## 2023-01-14 MED ORDER — SODIUM CHLORIDE 0.9 % IV SOLN
Freq: Once | INTRAVENOUS | Status: AC
  Filled 2023-01-14: qty 250

## 2023-01-14 MED ORDER — POTASSIUM CHLORIDE 20 MEQ PO PACK
20.0000 meq | PACK | Freq: Two times a day (BID) | ORAL | 3 refills | Status: DC
Start: 2023-01-14 — End: 2023-04-18

## 2023-01-14 NOTE — Progress Notes (Signed)
Patient states that she is doing well, and she doesn't;t have any new questions or concerns today for Dr. Leonard Schwartz.

## 2023-01-14 NOTE — Patient Instructions (Signed)

## 2023-01-14 NOTE — Assessment & Plan Note (Addendum)
#   MAY 2023- STAGE III-T4N1 ER/PR positive HER2 POSITIVE RIGHT breast cancer; LN-positive-ER/PR positive her-2 NEGATIVE; # s/p neoadjuvant chemotherapy- TCH plus P x 6 cycles- s/p Right mastectomy-[15th, NOv 2023] ypT2 [22mm]; ypN0.  Given partial response/involvement of the breast skin/multifocal disease- s/p RT- 08/27/2022. atient currently on tamoxifen [DEC-JAN 2024]; + OFS- Lupron every 3 months ]. ON Ado-trastuzumab emtansine  for 14 cycles.   # proceed with Ado-trastuzumab emtansine #10 of planned 14 cycles.  MUGA- June 13th, 2024- Normal LEFT ventricular ejection fraction -58%   stable. Left mammogram- wnl. Stable.   # Mild Thrombocytopenia- > 100- monitor for now. Stable.   # Allergic reaction to Ado-trastuzumab emtansine-fever/rash- currently on Claritin x3 days postchemotherapy; Tylenol preemptively.  Decreased Benadryl 25 mg given drowsiness. Stable.    # Hypocalcemia-  ON adjuvant Zometa q 6 months x 3 years [first- May-17th, 2024].   Continue ca+Vit D BID. Vit D FEB 2024- 54; [albumin 3.0]- Stable.   # Right sided swelling/tightness- s/p  Maureen- Stable. .   # Infusion reaction - ZOmeta [flu like symtoms]- imprived with NSAIDs- Stable.   # Diabetes- BG 159 [postbar]- recently increased Farxiga 10 mg- [OFF ozempic/metformin- intolerance]- Stable.   # Hypokalemia: continue Kdur BID- Stable.  # PV access: port functional-   Lupron 10/22/2022 - q 69M; Zometa [over 30 mins] q 6 M- #1 10/22/22   # DISPOSITION: # Kadcyla today;  # follow up in 3 week- MD;  port- labs; cbc/cmp; Kadcyla; Lupron - Dr.B

## 2023-01-14 NOTE — Progress Notes (Signed)
[facility] CONSULT NOTE  [name], [name], [name], [name]  CHIEF COMPLAINTS/PURPOSE OF CONSULTATION: Breast cancer  Oncology History Overview Note  MAY 2023-    Targeted ultrasound is performed, showing a 2.9 x 1.9 x 2.0 cm irregular hypoechoic mass right breast 10 o'clock position 6 cm from nipple at the site of palpable concern.   There is an abnormal 8 mm thickened right axillary lymph node.   There is skin thickening overlying the right breast. ------------------  A. BREAST, RIGHT AT 10:00, 6 CM FROM THE NIPPLE; ULTRASOUND-GUIDED CORE  NEEDLE BIOPSY:  - INVASIVE MAMMARY CARCINOMA, NO SPECIAL TYPE.   Size of invasive carcinoma: 11 mm in this sample  Histologic grade of invasive carcinoma: Grade 2                       Glandular/tubular differentiation score: 3                       Nuclear pleomorphism score: 2                       Mitotic rate score: 1                       Total score: 6  Ductal carcinoma in situ: Present, intermediate grade  Lymphovascular invasion: Not identified   B. LYMPH NODE, RIGHT AXILLARY; ULTRASOUND-GUIDED CORE NEEDLE BIOPSY:  - 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 IN GREATEST  EXTENT.   Comment:  The malignancy in the primary breast lesion appears morphologically  different from tumor in the lymph node sampling, with tumor present in  lymph node more suggestive of a histologic grade 3, with significantly  increased mitotic activity and pleomorphism. Due to the discrepancy in  these morphologic patterns, ER/PR/HER2 immunohistochemistry will be  performed on both blocks A1 and B1, with reflex to FISH for HER2 2+. The  results will be reported in an addendum.   ADDENDUM:  CASE SUMMARY: BREAST BIOMARKER TESTS - A - BREAST, RIGHT AT 10:00  Estrogen Receptor  (ER) Status: POSITIVE          Percentage of cells with nuclear positivity: 71-80%          Average intensity of staining: Strong   Progesterone Receptor (PgR) Status: POSITIVE          Percentage of cells with nuclear positivity: 81-90%          Average intensity of staining: Strong   HER2 (by immunohistochemistry): POSITIVE (Score 3+)       Percentage of cells with uniform intense complete membrane  staining: 61-70%  HER2 displays a heterogeneous staining pattern, with areas showing a  strong 3+ staining pattern, and other regions with complete absence (0+)  of staining.   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  Discussed with Dr. [name] tumor heterogeneity. BREAST MRI- JUNE 2023- The patient's known malignancy in the upper outer quadrant of the right breast measures 3.8 x 2.8 x 3.6 cm. Numerous other suspicious masses are identified in the right breast as above involving the upper outer and upper inner quadrants, located both anterior and posterior to the known malignancy.  Numerous enhancing foci in the skin as above are worrisome for skin metastases ; Bx proven. Biopsy proven metastatic node in the right axilla.  No evidence of malignancy in the left breast;  Two mildly prominent right internal mammary nodes were not FDG avid on recent PET-CT imaging.  Currently on neoadjuvant chemotherapy- TCH  plus P x6 cycles;  MUGA scan May 31st, 2023- -61%.    # June 7th, 2023- NEO-ADJUVANT CHEMO- TCH+P x6 cycles- s/p Mastectomy [NOV 15th, 2023] [Dr.Byrnett]-  ypT2 [34mm]; ypN0.  # JAN mid, 2024 -TAMOXFEN 20 mg a day; ADD LupronFEB 2nd.2 2024 s/p RT maech, 22nd- 2024  # FEB 2nd, 2023- Kadcyla q 3 W x14 cycles;   # Lupron q3 M [/start 2/07-2022]   # Genetic counseling s/p- NEG for any deleterious mutations.     Carcinoma of upper-outer quadrant of right breast in female, estrogen receptor positive (HCC)  10/26/2021 Initial Diagnosis   Carcinoma of upper-outer quadrant of right breast in female, estrogen receptor positive (HCC)   10/26/2021 Cancer Staging   Staging form: Breast, AJCC 8th Edition - Clinical: Stage IIIA (cT4d, cN1, cM0, G2, ER+, PR+, HER2+) - Signed by [name], MD on 06/04/2022 Histologic grading system: 3 grade system   11/10/2021 - 03/01/2022 Chemotherapy   Patient is on Treatment Plan : BREAST  Docetaxel + Carboplatin + Trastuzumab + Pertuzumab  (TCHP) q21d      11/11/2021 - 01/13/2022 Chemotherapy   Patient is on Treatment Plan : BREAST  Docetaxel + Carboplatin + Trastuzumab + Pertuzumab  (TCHP) q21d       Genetic Testing   Negative genetic testing. No pathogenic variants identified on the [facility] CancerNext-Expanded+RNA panel. The report date is 11/29/2021.  The CancerNext-Expanded + RNAinsight gene panel offered by [employer] and includes sequencing and rearrangement analysis for the following 77 genes: IP, ALK, APC*, ATM*, AXIN2, BAP1, BARD1, BLM, BMPR1A, BRCA1*, BRCA2*, BRIP1*, CDC73, CDH1*,CDK4, CDKN1B, CDKN2A, CHEK2*, CTNNA1, DICER1, FANCC, FH, FLCN, GALNT12, KIF1B, LZTR1, MAX, MEN1, MET, MLH1*, MSH2*, MSH3, MSH6*, MUTYH*, NBN, NF1*, NF2, NTHL1, PALB2*, PHOX2B, PMS2*, POT1, PRKAR1A, PTCH1, PTEN*, RAD51C*, RAD51D*,RB1, RECQL, RET, SDHA, SDHAF2, SDHB, SDHC, SDHD, SMAD4, SMARCA4, SMARCB1, SMARCE1, STK11, SUFU, TMEM127, TP53*,TSC1, TSC2, VHL and XRCC2 (sequencing  and deletion/duplication); EGFR, EGLN1, HOXB13, KIT, MITF, PDGFRA, POLD1 and POLE (sequencing only); EPCAM and GREM1 (deletion/duplication only).   07/09/2022 -  Chemotherapy   Patient is on Treatment Plan : BREAST ADO-Trastuzumab Emtansine (Kadcyla) q21d      HISTORY OF PRESENTING ILLNESS: Patient is ambulating independently.  Accompanied by her husband.   Marilyn Page 47 y.o.  female right breast TRIPLE POSITIVE with T4- Stage III -s/p mastectomy-with expanders-currently on adjuvant kadcyla / tamoxifen + OFS  is here for follow-up.    Patient states that she is doing well, and she doesn't;t have any new questions or concerns today.   Continues to have mild tightness  of right chest wall. Not any significantly worse.  Patient oral Potassium.  Mild  neuropathy.   Review of Systems  Constitutional:  Positive for malaise/fatigue. Negative for chills, diaphoresis, fever and weight loss.  HENT:  Negative for nosebleeds and sore throat.   Eyes:  Negative for double vision.  Respiratory:  Negative for cough, hemoptysis, sputum production, shortness of breath and wheezing.   Cardiovascular:  Negative for chest pain, palpitations, orthopnea and leg swelling.  Gastrointestinal:  Negative for abdominal pain, blood in stool, constipation, diarrhea, heartburn, melena, nausea and vomiting.  Genitourinary:  Negative for dysuria, frequency and urgency.  Musculoskeletal:  Positive for myalgias. Negative for back pain and joint pain.  Skin: Negative.  Negative for itching and rash.  Neurological:  Negative for dizziness, tingling, focal weakness, weakness and headaches.  Endo/Heme/Allergies:  Does not bruise/bleed easily.  Psychiatric/Behavioral:  Negative for depression. The patient is not nervous/anxious and does not have insomnia.      MEDICAL HISTORY:  Past Medical History:  Diagnosis Date   Anemia    Carcinoma of upper-outer quadrant of right breast in female, estrogen receptor positive (HCC)  10/26/2021   Diabetes mellitus without complication (HCC)    Family history of adverse reaction to anesthesia    grandmother had issues but doesnt know what the issue was, she was older.   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 Axilla Butterfly hydro clip-MACRO METASTATIC MAMMARY CARCINOMA   BREAST BIOPSY Right 04/21/2022   [place] RT PLC BREAST LOC DEV   1ST LESION  INC US GUIDE 04/21/2022 [facility]   BREAST RECONSTRUCTION WITH PLACEMENT OF TISSUE EXPANDER AND FLEX HD (ACELLULAR HYDRATED DERMIS) Right 04/21/2022   Procedure: RIGHT BREAST RECONSTRUCTION WITH PLACEMENT OF TISSUE EXPANDER AND FLEX HD (ACELLULAR HYDRATED DERMIS);  Surgeon: Peggye Form, DO;  Location: [facility] ORS;  Service: Plastics;  Laterality: Right;   CESAREAN SECTION  2005/2008   DILATION AND CURETTAGE OF UTERUS  2003   PORTACATH PLACEMENT Left 11/09/2021   Procedure: INSERTION PORT-A-CATH;  Surgeon: Earline Mayotte, MD;  Location: [facility];  Service: General;  Laterality: Left;   REMOVAL OF TISSUE EXPANDER AND PLACEMENT OF IMPLANT Right 06/08/2022   Procedure: REMOVAL OF TISSUE EXPANDER;  Surgeon: Peggye Form, DO;  Location: [facility];  Service: Plastics;  Laterality: Right;   SIMPLE MASTECTOMY WITH AXILLARY SENTINEL NODE BIOPSY Right 04/21/2022   Procedure: SIMPLE MASTECTOMY WITH AXILLARY SENTINEL NODE BIOPSY;  Surgeon: Earline Mayotte, MD;  Location: [facility];  Service: General;  Laterality: Right;  wire localization of lymph node   SKIN BIOPSY Right 11/09/2021   Procedure: PUNCH BIOPSY SKIN, TATTOO LYMPH NODE RIGHT AXILLA;  Surgeon: Earline Mayotte, MD;  Location: [facility];  Service: General;  Laterality: Right;   WISDOM TOOTH EXTRACTION      SOCIAL HISTORY: Social History   Socioeconomic History   Marital status: Married    Spouse name: Fraser Din   Number of children: 2   Years of education: Not on file   Highest education level: Not on file  Occupational History   Occupation: and Architectural technologist  Tobacco Use   Smoking status: Never   Smokeless tobacco: Never  Vaping Use   Vaping status: Never Used  Substance and Sexual Activity   Alcohol use: Never   Drug use: Never   Sexual activity: Yes    Birth control/protection: Other-see comments, Surgical    Comment: vasectomy  Other Topics Concern   Not on file  Social History Narrative   Pre-school teacher; in [place]; 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    Social Determinants of Health   Financial Resource Strain: Not on file  Food Insecurity: Not on file  Transportation Needs: Not on file  Physical Activity: Inactive (09/26/2017)   Exercise Vital Sign    Days of Exercise per Week: 0 days    Minutes of Exercise per Session: 0 min  Stress: Not on file  Social Connections: Not on file  Intimate Partner Violence: Not on file    FAMILY HISTORY: Family History  Problem Relation Age of Onset   Ovarian cysts Mother    Migraines Mother    Melanoma Mother        on leg   Hyperlipidemia Father    Diabetes Maternal Aunt    Stomach cancer Paternal Aunt    Diabetes Maternal Grandfather    Lymphoma Cousin 17   Breast cancer Neg Hx     ALLERGIES:  is allergic to metformin and oxycodone.  MEDICATIONS:  Current Outpatient Medications  Medication Sig Dispense Refill   acetaminophen (TYLENOL) 325 MG tablet Take 2 tablets (650 mg total) by mouth every 6 (six) hours as needed for mild pain (or Fever >/= 101).     ascorbic acid (VITAMIN C) 500 MG tablet Take 1 tablet (500 mg total) by mouth daily. 30 tablet    calcium-vitamin D (OSCAL WITH D) 500-5 MG-MCG tablet Take 1 tablet by  mouth 2 (two) times daily. 60 tablet 2   Cyanocobalamin (VITAMIN B-12 PO) Take 1 tablet by mouth daily.     Dapagliflozin Propanediol (FARXIGA PO) Take 10 mg by mouth daily.     ferrous sulfate 325 (65 FE) MG tablet Take 325 mg by mouth 2 (two) times daily with a meal.     lidocaine-prilocaine (EMLA) cream Apply on the port. 30 -45 min  prior to port access. 30 g 3   loratadine (CLARITIN) 10 MG tablet Take 10 mg by mouth as needed for allergies. Day before tx, day of and day after.     Magnesium Cl-Calcium Carbonate (SLOW MAGNESIUM/CALCIUM) 70-117 MG TBEC Take 1 tablet by mouth 2 (two) times daily. 60 tablet 6   Multiple Vitamin (MULTIVITAMIN WITH MINERALS) TABS tablet Take 1 tablet by mouth daily.     ondansetron (ZOFRAN) 4 MG tablet Take 1 tablet (4 mg total) by mouth every 8 (eight) hours as needed for up to 20 doses for nausea or vomiting. 20 tablet 0   ondansetron (ZOFRAN-ODT) 8 MG disintegrating tablet Take 8 mg by mouth every 8 (eight) hours as needed for nausea or vomiting.     ONETOUCH VERIO test strip SMARTSIG:1 Each Via Meter Daily PRN     prochlorperazine (COMPAZINE) 10 MG tablet Take 1 tablet (10 mg total) by mouth every 6 (six) hours as needed for nausea or vomiting. 40 tablet 1   tamoxifen (NOLVADEX) 20 MG tablet TAKE 1 TABLET BY MOUTH EVERY DAY 90 tablet 1   potassium chloride (KLOR-CON) 20 MEQ packet Take 20 mEq by mouth 2 (two) times daily. 60 packet 3   No current facility-administered medications  for this visit.      Marland Kitchen  PHYSICAL EXAMINATION: ECOG PERFORMANCE STATUS: 0 - Asymptomatic  Vitals:   01/14/23 0909  BP: 116/78  Pulse: 76  Temp: 98.8 F (37.1 C)  SpO2: 98%        Filed Weights   01/14/23 0909  Weight: 210 lb (95.3 kg)     Mild swelling noted in the posteriorly on the right side chest wall.  Physical Exam Vitals and nursing note reviewed.  HENT:     Head: Normocephalic and atraumatic.     Mouth/Throat:     Pharynx: Oropharynx is clear.   Eyes:     Extraocular Movements: Extraocular movements intact.     Pupils: Pupils are equal, round, and reactive to light.  Cardiovascular:     Rate and Rhythm: Normal rate and regular rhythm.  Pulmonary:     Comments: Decreased breath sounds bilaterally.  Abdominal:     Palpations: Abdomen is soft.  Musculoskeletal:        General: Normal range of motion.     Cervical back: Normal range of motion.  Skin:    General: Skin is warm.  Neurological:     General: No focal deficit present.     Mental Status: She is alert and oriented to person, place, and time.  Psychiatric:        Behavior: Behavior normal.        Judgment: Judgment normal.      LABORATORY DATA:  I have reviewed the data as listed Lab Results  Component Value Date   WBC 4.6 01/14/2023   HGB 11.5 (L) 01/14/2023   HCT 36.2 01/14/2023   MCV 84.6 01/14/2023   PLT 102 (L) 01/14/2023   Recent Labs    11/12/22 0918 12/03/22 0938 12/24/22 1045 01/14/23 0917  NA 137 138 138 137  K 3.4* 3.4* 3.2* 3.4*  CL 104 104 106 106  CO2 27 26 26 25   GLUCOSE 148* 206* 170* 159*  BUN 12 12 11 12   CREATININE 0.50 0.62 0.58 0.60  CALCIUM 8.9 8.6* 8.6* 8.6*  GFRNONAA >60 >60  --  >60  PROT 6.8 6.8 7.1 7.1  ALBUMIN 3.0* 3.1* 3.2* 3.2*  AST 30 32 33 39  ALT 29 26 29  34  ALKPHOS 53 50 47 47  BILITOT 0.6 0.6 0.5 0.6    RADIOGRAPHIC STUDIES: I have personally reviewed the radiological images as listed and agreed with the findings in the report. No results found.  ASSESSMENT & PLAN:   Carcinoma of upper-outer quadrant of right breast in female, estrogen receptor positive (HCC) # MAY 2023- STAGE III-T4N1 ER/PR positive HER2 POSITIVE RIGHT breast cancer; LN-positive-ER/PR positive her-2 NEGATIVE; # s/p neoadjuvant chemotherapy- TCH plus P x 6 cycles- s/p Right mastectomy-[15th, NOv 2023] ypT2 [21mm]; ypN0.  Given partial response/involvement of the breast skin/multifocal disease- s/p RT- 08/27/2022. atient currently on  tamoxifen [DEC-JAN 2024]; + OFS- Lupron every 3 months ]. ON Ado-trastuzumab emtansine  for 14 cycles.   # proceed with Ado-trastuzumab emtansine #10 of planned 14 cycles.  MUGA- June 13th, 2024- Normal LEFT ventricular ejection fraction -58%   stable. Left mammogram- wnl. Stable.   # Mild Thrombocytopenia- > 100- monitor for now. Stable.   # Allergic reaction to Ado-trastuzumab emtansine-fever/rash- currently on Claritin x3 days postchemotherapy; Tylenol preemptively.  Decreased Benadryl 25 mg given drowsiness. Stable.    # Hypocalcemia-  ON adjuvant Zometa q 6 months x 3 years [first- May-17th, 2024].   Continue ca+Vit  D BID. Vit D FEB 2024- 54; [albumin 3.0]- Stable.   # Right sided swelling/tightness- s/p  Maureen- Stable. .   # Infusion reaction - ZOmeta [flu like symtoms]- imprived with NSAIDs- Stable.   # Diabetes- BG 159 [postbar]- recently increased Farxiga 10 mg- [OFF ozempic/metformin- intolerance]- Stable.   # Hypokalemia: continue Kdur BID- Stable.  # PV access: port functional-   Lupron 10/22/2022 - q 17M; Zometa [over 30 mins] q 6 M- #1 10/22/22   # DISPOSITION: # Kadcyla today;  # follow up in 3 week- MD;  port- labs; cbc/cmp; Kadcyla; Lupron - Dr.B   All questions were answered. The patient/family knows to call the clinic with any problems, questions or concerns.     [name], MD 01/14/2023 10:02 AM

## 2023-01-25 ENCOUNTER — Other Ambulatory Visit: Payer: Self-pay | Admitting: *Deleted

## 2023-01-25 ENCOUNTER — Ambulatory Visit: Payer: 59 | Attending: Gerontology | Admitting: Occupational Therapy

## 2023-01-25 DIAGNOSIS — Z170 Estrogen receptor positive status [ER+]: Secondary | ICD-10-CM

## 2023-01-25 DIAGNOSIS — L905 Scar conditions and fibrosis of skin: Secondary | ICD-10-CM | POA: Insufficient documentation

## 2023-01-25 DIAGNOSIS — M25611 Stiffness of right shoulder, not elsewhere classified: Secondary | ICD-10-CM | POA: Insufficient documentation

## 2023-01-25 DIAGNOSIS — M6281 Muscle weakness (generalized): Secondary | ICD-10-CM | POA: Insufficient documentation

## 2023-01-25 NOTE — Therapy (Signed)
[facility] [address], [place], [address] Phone: [phone]   Fax:  [phone]  Occupational Therapy Screen  Patient Details  Name: Marilyn Page MRN: [id number] Date of Birth: 12-22-1975 No data recorded  Encounter Date: 01/25/2023   OT End of Session - 01/25/23 1456     Visit Number 0             Past Medical History:  Diagnosis Date   Anemia    Carcinoma of upper-outer quadrant of right breast in female, estrogen receptor positive (HCC) 10/26/2021   Diabetes mellitus without complication (HCC)    Family history of adverse reaction to anesthesia    grandmother had issues but doesnt know what the issue was, she was older.   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 Axilla Butterfly hydro clip-MACRO METASTATIC MAMMARY CARCINOMA   BREAST BIOPSY Right 04/21/2022   [place] RT PLC BREAST LOC DEV   1ST LESION  INC US GUIDE 04/21/2022 [facility]   BREAST RECONSTRUCTION WITH PLACEMENT OF TISSUE EXPANDER AND FLEX HD (ACELLULAR HYDRATED DERMIS) Right 04/21/2022   Procedure: RIGHT BREAST RECONSTRUCTION WITH PLACEMENT OF TISSUE EXPANDER AND FLEX HD (ACELLULAR HYDRATED DERMIS);  Surgeon: Peggye Form, DO;  Location: [facility];  Service: Plastics;  Laterality: Right;   CESAREAN SECTION  2005/2008   DILATION AND CURETTAGE OF UTERUS  2003   PORTACATH PLACEMENT Left 11/09/2021   Procedure: INSERTION PORT-A-CATH;  Surgeon: Earline Mayotte, MD;  Location: [facility];  Service: General;  Laterality: Left;   REMOVAL OF TISSUE EXPANDER AND PLACEMENT OF IMPLANT Right 06/08/2022    Procedure: REMOVAL OF TISSUE EXPANDER;  Surgeon: Peggye Form, DO;  Location: [facility];  Service: Plastics;  Laterality: Right;   SIMPLE MASTECTOMY WITH AXILLARY SENTINEL NODE BIOPSY Right 04/21/2022   Procedure: SIMPLE MASTECTOMY WITH AXILLARY SENTINEL NODE BIOPSY;  Surgeon: Earline Mayotte, MD;  Location: [facility];  Service: General;  Laterality: Right;  wire localization of lymph node   SKIN BIOPSY Right 11/09/2021   Procedure: PUNCH BIOPSY SKIN, TATTOO LYMPH NODE RIGHT AXILLA;  Surgeon: Earline Mayotte, MD;  Location: [facility];  Service: General;  Laterality: Right;   WISDOM TOOTH EXTRACTION      There were no vitals filed for this visit.   Subjective Assessment - 01/25/23 1455     Subjective  I am doing okay - got the Jovipak and wearing it at nigh time - swelling is better- I am doing the stretches but has some pain in my arm - the dog pushed it the other night - really hurt                 LYMPHEDEMA/ONCOLOGY QUESTIONNAIRE - 01/25/23 0001       Right Upper Extremity Lymphedema   15 cm Proximal to Olecranon Process 37.5 cm    10 cm Proximal to Olecranon Process 35 cm    Olecranon Process 29 cm      Left Upper Extremity Lymphedema   15 cm Proximal to Olecranon Process 39 cm    10 cm Proximal to  Olecranon Process 36.8 cm    Olecranon Process 29.2 cm                 T SCREEN 3/20/2       Pt arrive after being seen in Dec 23 last time - since then had L expander removed  because of infection and radiation she has 3 more sessions left- some blistering in axilla and limiting her shoulder ABD more than flexion on L UE. Pt to keep ABD to about 90 degrees- and flexion in pain free motion.  Cannot do any manaul therapy until 4-6 wks after radiation Pt circumference in bilateral UE compare to WNL  Pt with L shoulder rounder and forward - lower than R - pt to do several times during day scapula retraction and chin tug Pt with tight and tenderness over L  upper trap - over use  Pt to do upper trap stretch  Follow up with me again after done with radiation and blisters are healed.         OT SCREEN 10/20/22: Patient arrived being seen 8 weeks ago.  Patient done with radiation.  Active shoulder range of motion increased but still continues to have tightness in right axilla and anterior shoulder over Pect. Reviewed with patient child pose stretches for Lats and  shoulder flexion Reminded patient to continue with her pulleys for shoulder flexion and abduction active assisted range of motion-per patient stopped doing it. 20 reps each Provided patient with red Thera-Band for scapular squeezes as well as shoulder extension 2 sets of 12 2 times a day Continue to focus on posture with retraction of right shoulder. Patient to have a follow-up appointment with Dr. Ulice Bold to decide about reconstruction. Patient to contact me if needed when to follow-up.     OT SCREEN 01/05/23: Patient referred for by Dr. B for swelling on the right thoracic.  Patient reports mostly bothering her with sleeping.  Light compression camisole felt better but still not pain-free.  Also some discomfort with shoulder range of motion overhead over lateral deltoid.  Tightness over pec muscle. Patient posture improved greatly since last seen.  Bilateral upper extremity circumference within normal limits compared to each other. Recommended for patient to get a unilateral postmastectomy Jovi pack to wear at nighttime as well as during the day on her camisole for about 3 weeks to clear up some lymphedema on the thoracic on the right. Also reviewed with patient stretches to focus on the tightness in the Pect and Lats muscle. Shoulder abduction and flexion in supine with rotation of the hips to the left. Continue with child's pose. As well as added for during the day at work doing active assisted range of motion for shoulder flexion abduction on the table. Patient overusing deltoid  and upper trap on the right for overcoming the tightness in the chest and axilla on the right.  Patient to follow-up with me in 3 weeks.     OT SCREEN 01/25/23: Patient referred  by Dr. B for swelling on the right thoracic.  Patient reports mostly bothering her with sleeping.  Light compression camisole felt better but still not pain-free.  Also some discomfort with shoulder range of motion overhead over lateral deltoid.  Tightness over pec muscle. Patient posture improved greatly since last seen.  Bilateral upper extremity circumference within normal limits compared to each other. Since 2-3 wks ago pt used at night time  Jovipak unilateral postmastectomy pad- doing well decreasing lymphedema on the thoracic  on the right. Pt cont to do stretches and posture improved greatly but cont to have  some increase pain over upper arm and shoulder the last 2 sessions Recommend for pt to have PT eval and Tx - pt had mastectomy with expanders Then 2nd surgery for removal  And then radiation  She works and live in [place]- wants to go to PT clinic in [place] Pt to follow up 4 wks into PT treatment                                  Visit Diagnosis: Scar tissue    Problem List Patient Active Problem List   Diagnosis Date Noted   Acquired absence of right breast 06/18/2022   Low vitamin B12 level 05/24/2022   Hypomagnesemia 05/16/2022   Lactic acid acidosis 03/06/2022   Fever and chills 03/05/2022   COVID-19 virus infection 12/28/2021   Thrombocytopenia (HCC) 12/28/2021   Diabetes mellitus (HCC) 12/28/2021   Febrile neutropenia (HCC) 12/27/2021   Sepsis (HCC) 12/27/2021   Genetic testing 12/01/2021   Carcinoma of upper-outer quadrant of right breast in female, estrogen receptor positive (HCC) 10/26/2021   Uterus, adenomyosis 05/14/2021   ASCUS of cervix with negative high risk HPV 04/23/2021   Iron deficiency anemia due to chronic blood loss 04/23/2021   Type 2  diabetes mellitus without complication, without long-term current use of insulin (HCC) 05/18/2020   RLS (restless legs syndrome) 05/14/2020   Menorrhagia with regular cycle 10/14/2017   BMI 37.0-37.9, adult 10/03/2017   History of gestational diabetes 10/03/2017    Oletta Cohn, OTR/L,CLT 01/25/2023, 2:58 PM  [facility] [address], [place], [address] Phone: [phone]   Fax:  [phone]  Name: Marilyn Page MRN: [id number] Date of Birth: Dec 06, 1975

## 2023-01-31 ENCOUNTER — Ambulatory Visit: Payer: 59

## 2023-01-31 DIAGNOSIS — L905 Scar conditions and fibrosis of skin: Secondary | ICD-10-CM | POA: Diagnosis present

## 2023-01-31 DIAGNOSIS — M6281 Muscle weakness (generalized): Secondary | ICD-10-CM | POA: Diagnosis present

## 2023-01-31 DIAGNOSIS — M25611 Stiffness of right shoulder, not elsewhere classified: Secondary | ICD-10-CM

## 2023-01-31 NOTE — Therapy (Signed)
OUTPATIENT PHYSICAL THERAPY SHOULDER EVALUATION   Patient Name: Marilyn Page MRN: [id number] DOB:[birth year], 47 y.o., female Today's Date: 01/31/2023  END OF SESSION:  PT End of Session - 01/31/23 1547     Visit Number 1    Number of Visits 17    Date for PT Re-Evaluation 03/28/23    PT Start Time 1547    PT Stop Time 1627    PT Time Calculation (min) 40 min    Activity Tolerance Patient tolerated treatment well    Behavior During Therapy [facility] for tasks assessed/performed             Past Medical History:  Diagnosis Date   Anemia    Carcinoma of upper-outer quadrant of right breast in female, estrogen receptor positive (HCC) 10/26/2021   Diabetes mellitus without complication (HCC)    Family history of adverse reaction to anesthesia    grandmother had issues but doesnt know what the issue was, she was older.   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 Axilla Butterfly hydro clip-MACRO METASTATIC MAMMARY CARCINOMA   BREAST BIOPSY Right 04/21/2022   [place] RT PLC BREAST LOC DEV   1ST LESION  INC US GUIDE 04/21/2022 [facility]   BREAST RECONSTRUCTION WITH PLACEMENT OF TISSUE EXPANDER AND FLEX HD (ACELLULAR HYDRATED DERMIS) Right 04/21/2022   Procedure: RIGHT BREAST RECONSTRUCTION WITH PLACEMENT OF TISSUE EXPANDER AND FLEX HD (ACELLULAR HYDRATED DERMIS);  Surgeon: Peggye Form, DO;  Location: [facility];  Service: Plastics;  Laterality: Right;   CESAREAN SECTION  2005/2008   DILATION AND CURETTAGE OF UTERUS  2003   PORTACATH PLACEMENT Left 11/09/2021   Procedure: INSERTION PORT-A-CATH;  Surgeon: Earline Mayotte, MD;  Location: [facility]  ORS;  Service: General;  Laterality: Left;   REMOVAL OF TISSUE EXPANDER AND PLACEMENT OF IMPLANT Right 06/08/2022   Procedure: REMOVAL OF TISSUE EXPANDER;  Surgeon: Peggye Form, DO;  Location: [facility];  Service: Plastics;  Laterality: Right;   SIMPLE MASTECTOMY WITH AXILLARY SENTINEL NODE BIOPSY Right 04/21/2022   Procedure: SIMPLE MASTECTOMY WITH AXILLARY SENTINEL NODE BIOPSY;  Surgeon: Earline Mayotte, MD;  Location: [facility];  Service: General;  Laterality: Right;  wire localization of lymph node   SKIN BIOPSY Right 11/09/2021   Procedure: PUNCH BIOPSY SKIN, TATTOO LYMPH NODE RIGHT AXILLA;  Surgeon: Earline Mayotte, MD;  Location: [facility];  Service: General;  Laterality: Right;   WISDOM TOOTH EXTRACTION     Patient Active Problem List   Diagnosis Date Noted   Acquired absence of right breast 06/18/2022   Low vitamin B12 level 05/24/2022   Hypomagnesemia 05/16/2022   Lactic acid acidosis 03/06/2022   Fever and chills 03/05/2022   COVID-19 virus infection 12/28/2021   Thrombocytopenia (HCC) 12/28/2021   Diabetes mellitus (HCC) 12/28/2021   Febrile neutropenia (HCC) 12/27/2021   Sepsis (HCC) 12/27/2021   Genetic testing 12/01/2021   Carcinoma of upper-outer quadrant of right breast in female, estrogen receptor positive (HCC) 10/26/2021   Uterus, adenomyosis 05/14/2021   ASCUS of cervix with negative high risk HPV 04/23/2021   Iron deficiency anemia due to chronic blood loss 04/23/2021  Type 2 diabetes mellitus without complication, without long-term current use of insulin (HCC) 05/18/2020   RLS (restless legs syndrome) 05/14/2020   Menorrhagia with regular cycle 10/14/2017   BMI 37.0-37.9, adult 10/03/2017   History of gestational diabetes 10/03/2017    PCP: [name], NP   REFERRING PROVIDER: [name], MD   REFERRING DIAG:  [phone] (ICD-10-CM) - Stiffness of right shoulder, not elsewhere classified  C50.411,Z17.0 (ICD-10-CM) - Carcinoma of  upper-outer quadrant of right breast in female, estrogen receptor positive    THERAPY DIAG:  Stiffness of right shoulder, not elsewhere classified  Muscle weakness (generalized)  Rationale for Evaluation and Treatment: Rehabilitation  ONSET DATE: 04/2022   SUBJECTIVE:                                                                                                                                                                                      SUBJECTIVE STATEMENT: Patient has been seeing [name], OT for lymphedema. Patient reports pain and "pulling" on posterior aspect of upper arm. Patient reports difficulty sleeping, reaching overhead, getting dressed (clasping bra), lifting heavy objects.  Patient has pulley system and red theraband. Has been completing stretches 2x/week and pulley/theraband 2-3x/week.  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 Started 6 weeks of radiation 07/2022 and currently in chemotherapy.   Patient referred to PT for R shoulder pain s/p R mastectomy/removal of expanders and radiation. Patient has seen OT for lymphedema and R shoulder ROM ~1 month for maintenance. OT has provided patient with stretches focused on pec and lat muscle tightness (shoulder abduction and flexion in supine with rotation of hips to the L), child's pose, and AAROM shoulder flexion/abduction on the table.    PAIN:  Are you having pain? Yes: NPRS scale: 2/10 - at worst 8/10 Pain location: R shoulder  Pain description: sharp, achy Aggravating factors: reaching, quick movements with arm  Relieving factors: Ice, massage ball   PRECAUTIONS: None  RED FLAGS: None   WEIGHT BEARING RESTRICTIONS: No  FALLS:  Has patient fallen in last 6 months? 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 [place]  PLOF: Independent  PATIENT GOALS:to get this to not hurt   NEXT MD VISIT: 02/04/23 - chemo  OBJECTIVE:   DIAGNOSTIC FINDINGS:  N/A  PATIENT SURVEYS:  FOTO 54 with goal of 69  COGNITION: Overall cognitive status: Within functional limits for tasks assessed     SENSATION: WFL  POSTURE: Rounded shoulders, forward head   UPPER EXTREMITY ROM:   Active ROM Right eval Left eval  Shoulder flexion 120 WFL   Shoulder extension    Shoulder abduction 87 WFL   Shoulder adduction    Shoulder internal rotation 35 WFL  Shoulder external rotation 30 WFL   (Blank rows = not tested)  UPPER EXTREMITY MMT:  MMT Right eval Left eval  Shoulder flexion 4+ 4+  Shoulder extension    Shoulder abduction 4* 4+  Shoulder adduction    Shoulder internal rotation 4* 4+  Shoulder external rotation 4* 4+  Middle trapezius    Lower trapezius    Elbow flexion 4+ 5  Elbow extension 4+ 5  (Blank rows = not tested)  JOINT MOBILITY TESTING:  Joint stiffness noted with AP/PA and inferior mobilization   PALPATION:  Significant trigger points and taut muscle bands at pec minor, R UT, and periscapular region.    TODAY'S TREATMENT:                                                                                                                                         DATE: 01/31/23  HEP provided to patient with focus on R shoulder AAROM and pectoralis major/minor stretching. Able to demonstrate proper technique and form after demonstration and cueing.   PATIENT EDUCATION: Education details: HEP, POC, goals  Person educated: Patient Education method: Explanation, Demonstration, and Handouts Education comprehension: verbalized understanding  HOME EXERCISE PROGRAM: Access Code: [email or web address] URL:  [email or web address] Date: 01/31/2023 Prepared by: Maylon Peppers  Exercises - Pec Minor Stretch  - 2-3 x daily - 7 x weekly - 5-10 reps - 30 hold - Corner Pec Major Stretch  - 2-3 x daily - 7 x weekly - 5-10 reps - 30 hold - Supine Shoulder Flexion with Dowel  - 2-3 x daily - 7 x weekly - 10 reps - 3-5 hold - Supine Shoulder Abduction AAROM with Dowel  - 2-3 x daily - 7 x weekly - 10 reps - 3-5 hold  ASSESSMENT:  CLINICAL IMPRESSION: Patient is a 47 y.o. female who was seen today for physical therapy evaluation and treatment for R shoulder stiffness after R mastectomy in 04/2022. Patient demonstrates activity limitations reaching overhead, dressing, and grooming due to impaired R shoulder ROM, R UE weakness, and pain with movement. Significant trigger points and taut muscle bands notable in R pec minor, R UT, and R periscapular region. Initiated HEP with focus on R shoulder AAROM and pec minor/major stretching. Patient will benefit from skilled PT interventions to address listed impairments to improve ADL tolerance, pain reduction, and improve pain free R shoulder ROM.   OBJECTIVE IMPAIRMENTS: decreased activity tolerance, decreased ROM, decreased strength, hypomobility, impaired flexibility, impaired UE functional use, and  postural dysfunction.   ACTIVITY LIMITATIONS: carrying, lifting, bathing, dressing, reach over head, and hygiene/grooming  PARTICIPATION LIMITATIONS: laundry, shopping, community activity, occupation, and yard work  PERSONAL FACTORS: Past/current experiences, Profession, Time since onset of injury/illness/exacerbation, and 1 comorbidity: breast cancer s/p mastectomy  are also affecting patient's functional outcome.   REHAB POTENTIAL: Good  CLINICAL DECISION MAKING: Stable/uncomplicated  EVALUATION COMPLEXITY: Moderate   GOALS: Goals reviewed with patient? Yes  SHORT TERM GOALS: Target date: 02/28/2023  Patient will be independent in HEP to improve  strength/ROM for better functional independence with ADLs. Baseline: 8/26: HEP initiated  Goal status: INITIAL  LONG TERM GOALS: Target date: 03/29/2023  Patient will increase FOTO score to equal to or greater than 69 to demonstrate statistically significant improvement in mobility and quality of life.  Baseline: 8/26: 54 Goal status: INITIAL  2.  Patient will demonstrate adequate R shoulder ROM and strength to be able to clasp bra, dress independently, and perform overhead activities with pain less than 2/10.  Baseline: 8/26: unable to clasp bra with IR limitation  Goal status: INITIAL  3.  Patient will improve B UE strength by 1/2 MMT grade to improve ability to complete iADLs and job related tasks.  Baseline: 8/26: see above Goal status: INITIAL  4.  Patient will report a worst pain of 2/10 on NRPS in R shoulder to improve tolerance with ADLs and reduced symptoms with activities.  Baseline: 8/26: worst pain 8/10 Goal status: INITIAL  PLAN:  PT FREQUENCY: 1-2x/week  PT DURATION: 8 weeks  PLANNED INTERVENTIONS: Therapeutic exercises, Therapeutic activity, Patient/Family education, Self Care, Joint mobilization, Joint manipulation, DME instructions, Dry Needling, Spinal mobilization, Cryotherapy, Moist heat, Taping, Traction, and Manual therapy  PLAN FOR NEXT SESSION: HEP review, manual therapy, dry needling as warranted   Maylon Peppers, PT, DPT Physical Therapist - Pablo Pena  [facility]  01/31/2023, 4:28 PM

## 2023-02-04 ENCOUNTER — Encounter: Payer: Self-pay | Admitting: Internal Medicine

## 2023-02-04 ENCOUNTER — Inpatient Hospital Stay: Payer: 59 | Admitting: Internal Medicine

## 2023-02-04 ENCOUNTER — Inpatient Hospital Stay: Payer: 59

## 2023-02-04 VITALS — BP 113/61 | HR 82 | Temp 97.2°F | Ht 66.0 in | Wt 207.8 lb

## 2023-02-04 VITALS — BP 115/62 | HR 66 | Temp 97.1°F | Resp 19

## 2023-02-04 DIAGNOSIS — Z170 Estrogen receptor positive status [ER+]: Secondary | ICD-10-CM | POA: Diagnosis not present

## 2023-02-04 DIAGNOSIS — D500 Iron deficiency anemia secondary to blood loss (chronic): Secondary | ICD-10-CM

## 2023-02-04 DIAGNOSIS — C50411 Malignant neoplasm of upper-outer quadrant of right female breast: Secondary | ICD-10-CM

## 2023-02-04 DIAGNOSIS — Z5112 Encounter for antineoplastic immunotherapy: Secondary | ICD-10-CM | POA: Diagnosis not present

## 2023-02-04 LAB — CBC WITH DIFFERENTIAL/PLATELET
Abs Immature Granulocytes: 0.01 10*3/uL (ref 0.00–0.07)
Basophils Absolute: 0 10*3/uL (ref 0.0–0.1)
Basophils Relative: 1 %
Eosinophils Absolute: 0.1 10*3/uL (ref 0.0–0.5)
Eosinophils Relative: 3 %
HCT: 35.3 % — ABNORMAL LOW (ref 36.0–46.0)
Hemoglobin: 11.4 g/dL — ABNORMAL LOW (ref 12.0–15.0)
Immature Granulocytes: 0 %
Lymphocytes Relative: 25 %
Lymphs Abs: 1 10*3/uL (ref 0.7–4.0)
MCH: 27.3 pg (ref 26.0–34.0)
MCHC: 32.3 g/dL (ref 30.0–36.0)
MCV: 84.7 fL (ref 80.0–100.0)
Monocytes Absolute: 0.3 10*3/uL (ref 0.1–1.0)
Monocytes Relative: 8 %
Neutro Abs: 2.6 10*3/uL (ref 1.7–7.7)
Neutrophils Relative %: 63 %
Platelets: 109 10*3/uL — ABNORMAL LOW (ref 150–400)
RBC: 4.17 MIL/uL (ref 3.87–5.11)
RDW: 15.8 % — ABNORMAL HIGH (ref 11.5–15.5)
WBC: 4 10*3/uL (ref 4.0–10.5)
nRBC: 0 % (ref 0.0–0.2)

## 2023-02-04 LAB — COMPREHENSIVE METABOLIC PANEL WITH GFR
ALT: 27 U/L (ref 0–44)
AST: 34 U/L (ref 15–41)
Albumin: 3.1 g/dL — ABNORMAL LOW (ref 3.5–5.0)
Alkaline Phosphatase: 47 U/L (ref 38–126)
Anion gap: 6 (ref 5–15)
BUN: 11 mg/dL (ref 6–20)
CO2: 26 mmol/L (ref 22–32)
Calcium: 8.8 mg/dL — ABNORMAL LOW (ref 8.9–10.3)
Chloride: 106 mmol/L (ref 98–111)
Creatinine, Ser: 0.46 mg/dL (ref 0.44–1.00)
GFR, Estimated: >60 mL/min
Glucose, Bld: 148 mg/dL — ABNORMAL HIGH (ref 70–99)
Potassium: 3.1 mmol/L — ABNORMAL LOW (ref 3.5–5.1)
Sodium: 138 mmol/L (ref 135–145)
Total Bilirubin: 0.4 mg/dL (ref 0.3–1.2)
Total Protein: 6.8 g/dL (ref 6.5–8.1)

## 2023-02-04 MED ORDER — DIPHENHYDRAMINE HCL 25 MG PO CAPS
25.0000 mg | ORAL_CAPSULE | Freq: Once | ORAL | Status: AC
  Administered 2023-02-04: 25 mg via ORAL
  Filled 2023-02-04: qty 1

## 2023-02-04 MED ORDER — SODIUM CHLORIDE 0.9 % IV SOLN
3.6000 mg/kg | Freq: Once | INTRAVENOUS | Status: AC
  Administered 2023-02-04: 360 mg via INTRAVENOUS
  Filled 2023-02-04: qty 8

## 2023-02-04 MED ORDER — HEPARIN SOD (PORK) LOCK FLUSH 100 UNIT/ML IV SOLN
500.0000 [IU] | Freq: Once | INTRAVENOUS | Status: AC | PRN
  Administered 2023-02-04: 500 [IU]
  Filled 2023-02-04: qty 5

## 2023-02-04 MED ORDER — SODIUM CHLORIDE 0.9 % IV SOLN
Freq: Once | INTRAVENOUS | Status: AC
  Filled 2023-02-04: qty 250

## 2023-02-04 MED ORDER — ACETAMINOPHEN 325 MG PO TABS
650.0000 mg | ORAL_TABLET | Freq: Once | ORAL | Status: AC
  Administered 2023-02-04: 650 mg via ORAL
  Filled 2023-02-04: qty 2

## 2023-02-04 MED ORDER — LEUPROLIDE ACETATE (3 MONTH) 11.25 MG IM KIT
11.2500 mg | PACK | Freq: Once | INTRAMUSCULAR | Status: AC
  Administered 2023-02-04: 11.25 mg via INTRAMUSCULAR
  Filled 2023-02-04: qty 11.25

## 2023-02-04 MED ORDER — PROCHLORPERAZINE MALEATE 10 MG PO TABS
10.0000 mg | ORAL_TABLET | Freq: Once | ORAL | Status: AC
  Administered 2023-02-04: 10 mg via ORAL
  Filled 2023-02-04: qty 1

## 2023-02-04 NOTE — Progress Notes (Signed)
[facility] CONSULT NOTE  Page Care Team: [name], [name], [name], [name]  CHIEF COMPLAINTS/PURPOSE OF CONSULTATION: Breast cancer  Oncology History Overview Note  MAY 2023-    Targeted ultrasound is performed, showing a 2.9 x 1.9 x 2.0 cm irregular hypoechoic mass right breast 10 o'clock position 6 cm from nipple at the site of palpable concern.   There is an abnormal 8 mm thickened right axillary lymph node.   There is skin thickening overlying the right breast. ------------------  A. BREAST, RIGHT AT 10:00, 6 CM FROM THE NIPPLE; ULTRASOUND-GUIDED CORE  NEEDLE BIOPSY:  - INVASIVE MAMMARY CARCINOMA, NO SPECIAL TYPE.   Size of invasive carcinoma: 11 mm in this sample  Histologic grade of invasive carcinoma: Grade 2                       Glandular/tubular differentiation score: 3                       Nuclear pleomorphism score: 2                       Mitotic rate score: 1                       Total score: 6  Ductal carcinoma in situ: Present, intermediate grade  Lymphovascular invasion: Not identified   B. LYMPH NODE, RIGHT AXILLARY; ULTRASOUND-GUIDED CORE NEEDLE BIOPSY:  - 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 IN GREATEST  EXTENT.   Comment:  The malignancy in the primary breast lesion appears morphologically  different from tumor in the lymph node sampling, with tumor present in  lymph node more suggestive of a histologic grade 3, with significantly  increased mitotic activity and pleomorphism. Due to the discrepancy in  these morphologic patterns, ER/PR/HER2 immunohistochemistry will be  performed on both blocks A1 and B1, with reflex to FISH for HER2 2+. The  results will be reported in an addendum.   ADDENDUM:  CASE SUMMARY: BREAST BIOMARKER TESTS - A - BREAST, RIGHT AT 10:00  Estrogen Receptor  (ER) Status: POSITIVE          Percentage of cells with nuclear positivity: 71-80%          Average intensity of staining: Strong   Progesterone Receptor (PgR) Status: POSITIVE          Percentage of cells with nuclear positivity: 81-90%          Average intensity of staining: Strong   HER2 (by immunohistochemistry): POSITIVE (Score 3+)       Percentage of cells with uniform intense complete membrane  staining: 61-70%  HER2 displays a heterogeneous staining pattern, with areas showing a  strong 3+ staining pattern, and other regions with complete absence (0+)  of staining.   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  Discussed with Dr. [name] tumor heterogeneity. BREAST MRI- JUNE 2023- The Page's known malignancy in the upper outer quadrant of the right breast measures 3.8 x 2.8 x 3.6 cm. Numerous other suspicious masses are identified in the right breast as above involving the upper outer and upper inner quadrants, located both anterior and posterior to the known malignancy.  Numerous enhancing foci in the skin as above are worrisome for skin metastases ; Bx proven. Biopsy proven metastatic node in the right axilla.  No evidence of malignancy in the left breast;  Two mildly prominent right internal mammary nodes were not FDG avid on recent PET-CT imaging.  Currently on neoadjuvant chemotherapy- TCH  plus P x6 cycles;  MUGA scan May 31st, 2023- -61%.    # June 7th, 2023- NEO-ADJUVANT CHEMO- TCH+P x6 cycles- s/p Mastectomy [NOV 15th, 2023] [Dr.Byrnett]-  ypT2 [76mm]; ypN0.  # JAN mid, 2024 -TAMOXFEN 20 mg a day; ADD LupronFEB 2nd.2 2024 s/p RT maech, 22nd- 2024  # FEB 2nd, 2023- Kadcyla q 3 W x14 cycles;   # Lupron q3 M [/start 2/07-2022]   # Genetic counseling s/p- NEG for any deleterious mutations.     Carcinoma of upper-outer quadrant of right breast in female, estrogen receptor positive (HCC)  10/26/2021 Initial Diagnosis   Carcinoma of upper-outer quadrant of right breast in female, estrogen receptor positive (HCC)   10/26/2021 Cancer Staging   Staging form: Breast, AJCC 8th Edition - Clinical: Stage IIIA (cT4d, cN1, cM0, G2, ER+, PR+, HER2+) - Signed by [name], MD on 06/04/2022 Histologic grading system: 3 grade system   11/10/2021 - 03/01/2022 Chemotherapy   Page is on Treatment Plan : BREAST  Docetaxel + Carboplatin + Trastuzumab + Pertuzumab  (TCHP) q21d      11/11/2021 - 01/13/2022 Chemotherapy   Page is on Treatment Plan : BREAST  Docetaxel + Carboplatin + Trastuzumab + Pertuzumab  (TCHP) q21d       Genetic Testing   Negative genetic testing. No pathogenic variants identified on the [facility] CancerNext-Expanded+RNA panel. The report date is 11/29/2021.  The CancerNext-Expanded + RNAinsight gene panel offered by [employer] and includes sequencing and rearrangement analysis for the following 77 genes: IP, ALK, APC*, ATM*, AXIN2, BAP1, BARD1, BLM, BMPR1A, BRCA1*, BRCA2*, BRIP1*, CDC73, CDH1*,CDK4, CDKN1B, CDKN2A, CHEK2*, CTNNA1, DICER1, FANCC, FH, FLCN, GALNT12, KIF1B, LZTR1, MAX, MEN1, MET, MLH1*, MSH2*, MSH3, MSH6*, MUTYH*, NBN, NF1*, NF2, NTHL1, PALB2*, PHOX2B, PMS2*, POT1, PRKAR1A, PTCH1, PTEN*, RAD51C*, RAD51D*,RB1, RECQL, RET, SDHA, SDHAF2, SDHB, SDHC, SDHD, SMAD4, SMARCA4, SMARCB1, SMARCE1, STK11, SUFU, TMEM127, TP53*,TSC1, TSC2, VHL and XRCC2 (sequencing  and deletion/duplication); EGFR, EGLN1, HOXB13, KIT, MITF, PDGFRA, POLD1 and POLE (sequencing only); EPCAM and GREM1 (deletion/duplication only).   07/09/2022 -  Chemotherapy   Page is on Treatment Plan : BREAST ADO-Trastuzumab Emtansine (Kadcyla) q21d      HISTORY OF PRESENTING ILLNESS: Page is ambulating independently.  Accompanied by her husband.   Marilyn Page 47 y.o.  female right breast TRIPLE POSITIVE with T4- Stage III -s/p mastectomy-with expanders-currently on adjuvant kadcyla / tamoxifen + OFS  is here for follow-up.    Page states that she is doing well, and she doesn't;t have any new questions or concerns today.   Continues to have mild tightness  of right chest wall. Started on PT with [name]. Page states she is compliant with therapy oral Potassium.  Mild  neuropathy.   Review of Systems  Constitutional:  Positive for malaise/fatigue. Negative for chills, diaphoresis, fever and weight loss.  HENT:  Negative for nosebleeds and sore throat.   Eyes:  Negative for double vision.  Respiratory:  Negative for cough, hemoptysis, sputum production, shortness of breath and wheezing.   Cardiovascular:  Negative for chest pain, palpitations, orthopnea and leg swelling.  Gastrointestinal:  Negative for abdominal pain, blood in stool, constipation, diarrhea, heartburn, melena, nausea and vomiting.  Genitourinary:  Negative for dysuria, frequency and urgency.  Musculoskeletal:  Positive for myalgias. Negative for back pain and joint pain.  Skin: Negative.  Negative for itching and rash.  Neurological:  Negative for dizziness, tingling, focal weakness, weakness and headaches.  Endo/Heme/Allergies:  Does not bruise/bleed easily.  Psychiatric/Behavioral:  Negative for depression. The Page is not nervous/anxious and does not have insomnia.      MEDICAL HISTORY:  Past Medical History:  Diagnosis Date   Anemia    Carcinoma of upper-outer quadrant of right breast in female,  estrogen receptor positive (HCC) 10/26/2021   Diabetes mellitus without complication (HCC)    Family history of adverse reaction to anesthesia    grandmother had issues but doesnt know what the issue was, she was older.   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 Axilla Butterfly hydro clip-MACRO METASTATIC MAMMARY CARCINOMA   BREAST BIOPSY Right 04/21/2022   [place] RT PLC BREAST LOC DEV   1ST LESION  INC US GUIDE 04/21/2022 [facility]   BREAST RECONSTRUCTION WITH PLACEMENT OF TISSUE EXPANDER AND FLEX HD (ACELLULAR HYDRATED DERMIS) Right 04/21/2022   Procedure: RIGHT BREAST RECONSTRUCTION WITH PLACEMENT OF TISSUE EXPANDER AND FLEX HD (ACELLULAR HYDRATED DERMIS);  Surgeon: Peggye Form, DO;  Location: [facility];  Service: Plastics;  Laterality: Right;   CESAREAN SECTION  2005/2008   DILATION AND CURETTAGE OF UTERUS  2003   PORTACATH PLACEMENT Left 11/09/2021   Procedure: INSERTION PORT-A-CATH;  Surgeon: Earline Mayotte, MD;  Location: [facility];  Service: General;  Laterality: Left;   REMOVAL OF TISSUE EXPANDER AND PLACEMENT OF IMPLANT Right 06/08/2022   Procedure: REMOVAL OF TISSUE EXPANDER;  Surgeon: Peggye Form, DO;  Location: [facility];  Service: Plastics;  Laterality: Right;   SIMPLE MASTECTOMY WITH AXILLARY SENTINEL NODE BIOPSY Right 04/21/2022   Procedure: SIMPLE MASTECTOMY WITH AXILLARY SENTINEL NODE BIOPSY;  Surgeon: Earline Mayotte, MD;  Location: [facility];  Service: General;  Laterality: Right;  wire localization of lymph node   SKIN BIOPSY Right 11/09/2021   Procedure: PUNCH BIOPSY SKIN, TATTOO LYMPH NODE RIGHT  AXILLA;  Surgeon: Earline Mayotte, MD;  Location: [facility];  Service: General;  Laterality: Right;   WISDOM TOOTH EXTRACTION      SOCIAL HISTORY: Social History   Socioeconomic History   Marital status: Married    Spouse name: Fraser Din   Number of children: 2   Years of education: Not on file   Highest education level: Not on file  Occupational History  Occupation: and Architectural technologist  Tobacco Use   Smoking status: Never   Smokeless tobacco: Never  Vaping Use   Vaping status: Never Used  Substance and Sexual Activity   Alcohol use: Never   Drug use: Never   Sexual activity: Yes    Birth control/protection: Other-see comments, Surgical    Comment: vasectomy  Other Topics Concern   Not on file  Social History Narrative   Pre-school teacher; in [place]; 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    Social Determinants of Health   Financial Resource Strain: Not on file  Food Insecurity: Not on file  Transportation Needs: Not on file  Physical Activity: Inactive (09/26/2017)   Exercise Vital Sign    Days of Exercise per Week: 0 days    Minutes of Exercise per Session: 0 min  Stress: Not on file  Social Connections: Not on file  Intimate Partner Violence: Not on file    FAMILY HISTORY: Family History  Problem Relation Age of Onset   Ovarian cysts Mother    Migraines Mother    Melanoma Mother        on leg   Hyperlipidemia Father    Diabetes Maternal Aunt    Stomach cancer Paternal Aunt    Diabetes Maternal Grandfather    Lymphoma Cousin 17   Breast cancer Neg Hx     ALLERGIES:  is allergic to metformin and oxycodone.  MEDICATIONS:  Current Outpatient Medications  Medication Sig Dispense Refill   acetaminophen (TYLENOL) 325 MG tablet Take 2 tablets (650 mg total) by mouth every 6 (six) hours as needed for mild pain (or Fever >/= 101).     ascorbic acid (VITAMIN C) 500 MG tablet Take 1 tablet (500 mg total) by mouth daily. 30 tablet    calcium-vitamin D (OSCAL WITH D)  500-5 MG-MCG tablet Take 1 tablet by mouth 2 (two) times daily. 60 tablet 2   Cyanocobalamin (VITAMIN B-12 PO) Take 1 tablet by mouth daily.     Dapagliflozin Propanediol (FARXIGA PO) Take 10 mg by mouth daily.     ferrous sulfate 325 (65 FE) MG tablet Take 325 mg by mouth 2 (two) times daily with a meal.     lidocaine-prilocaine (EMLA) cream Apply on the port. 30 -45 min  prior to port access. 30 g 3   loratadine (CLARITIN) 10 MG tablet Take 10 mg by mouth as needed for allergies. Day before tx, day of and day after.     Magnesium Cl-Calcium Carbonate (SLOW MAGNESIUM/CALCIUM) 70-117 MG TBEC Take 1 tablet by mouth 2 (two) times daily. 60 tablet 6   Multiple Vitamin (MULTIVITAMIN WITH MINERALS) TABS tablet Take 1 tablet by mouth daily.     ondansetron (ZOFRAN-ODT) 8 MG disintegrating tablet Take 8 mg by mouth every 8 (eight) hours as needed for nausea or vomiting.     ONETOUCH VERIO test strip SMARTSIG:1 Each Via Meter Daily PRN     potassium chloride (KLOR-CON) 20 MEQ packet Take 20 mEq by mouth 2 (two) times daily. 60 packet 3   prochlorperazine (COMPAZINE) 10 MG tablet Take 1 tablet (10 mg total) by mouth every 6 (six) hours as needed for nausea or vomiting. 40 tablet 1   tamoxifen (NOLVADEX) 20 MG tablet TAKE 1 TABLET BY MOUTH EVERY DAY 90 tablet 1   ondansetron (ZOFRAN) 4 MG tablet Take 1 tablet (4 mg total) by mouth every 8 (eight) hours as needed for up to 20 doses for nausea or vomiting. (Page not taking: Reported  on 02/04/2023) 20 tablet 0   No current facility-administered medications for this visit.   Facility-Administered Medications Ordered in Other Visits  Medication Dose Route Frequency Provider Last Rate Last Admin   acetaminophen (TYLENOL) tablet 650 mg  650 mg Oral Once [name], MD       ado-trastuzumab emtansine (KADCYLA) 360 mg in sodium chloride 0.9 % 250 mL chemo infusion  3.6 mg/kg (Treatment Plan Recorded) Intravenous Once [name], MD        diphenhydrAMINE (BENADRYL) capsule 25 mg  25 mg Oral Once [name], MD       prochlorperazine (COMPAZINE) tablet 10 mg  10 mg Oral Once [name], MD          .  PHYSICAL EXAMINATION: ECOG PERFORMANCE STATUS: 0 - Asymptomatic  Vitals:   02/04/23 0843  BP: 113/61  Pulse: 82  Temp: (!) 97.2 F (36.2 C)  SpO2: 100%        Filed Weights   02/04/23 0843  Weight: 207 lb 12.8 oz (94.3 kg)     Mild swelling noted in the posteriorly on the right side chest wall.  Physical Exam Vitals and nursing note reviewed.  HENT:     Head: Normocephalic and atraumatic.     Mouth/Throat:     Pharynx: Oropharynx is clear.  Eyes:     Extraocular Movements: Extraocular movements intact.     Pupils: Pupils are equal, round, and reactive to light.  Cardiovascular:     Rate and Rhythm: Normal rate and regular rhythm.  Pulmonary:     Comments: Decreased breath sounds bilaterally.  Abdominal:     Palpations: Abdomen is soft.  Musculoskeletal:        General: Normal range of motion.     Cervical back: Normal range of motion.  Skin:    General: Skin is warm.  Neurological:     General: No focal deficit present.     Mental Status: She is alert and oriented to person, place, and time.  Psychiatric:        Behavior: Behavior normal.        Judgment: Judgment normal.      LABORATORY DATA:  I have reviewed the data as listed Lab Results  Component Value Date   WBC 4.0 02/04/2023   HGB 11.4 (L) 02/04/2023   HCT 35.3 (L) 02/04/2023   MCV 84.7 02/04/2023   PLT 109 (L) 02/04/2023   Recent Labs    12/03/22 0938 12/24/22 1045 01/14/23 0917 02/04/23 0844  NA 138 138 137 138  K 3.4* 3.2* 3.4* 3.1*  CL 104 106 106 106  CO2 26 26 25 26   GLUCOSE 206* 170* 159* 148*  BUN 12 11 12 11   CREATININE 0.62 0.58 0.60 0.46  CALCIUM 8.6* 8.6* 8.6* 8.8*  GFRNONAA >60  --  >60 >60  PROT 6.8 7.1 7.1 6.8  ALBUMIN 3.1* 3.2* 3.2* 3.1*  AST 32 33 39 34  ALT 26 29 34 27   ALKPHOS 50 47 47 47  BILITOT 0.6 0.5 0.6 0.4    RADIOGRAPHIC STUDIES: I have personally reviewed the radiological images as listed and agreed with the findings in the report. No results found.  ASSESSMENT & PLAN:   Carcinoma of upper-outer quadrant of right breast in female, estrogen receptor positive (HCC) # MAY 2023- STAGE III-T4N1 ER/PR positive HER2 POSITIVE RIGHT breast cancer; LN-positive-ER/PR positive her-2 NEGATIVE; # s/p neoadjuvant chemotherapy- TCH plus P x 6 cycles- s/p Right  mastectomy-[15th, NOv 2023] ypT2 [23mm]; ypN0.  Given partial response/involvement of the breast skin/multifocal disease- s/p RT- 08/27/2022. atient currently on tamoxifen [DEC-JAN 2024]; + OFS- Lupron every 3 months ]. ON Ado-trastuzumab emtansine  for 14 cycles.   # proceed with Ado-trastuzumab emtansine #11 of planned 14 cycles.  MUGA- June 13th, 2024- Normal LEFT ventricular ejection fraction -58%   stable. Left mammogram- wnl. Stable.   # Mild Thrombocytopenia- > 100- monitor for now. Stable.   # Allergic reaction to Ado-trastuzumab emtansine-fever/rash- currently on Claritin x3 days postchemotherapy; Tylenol preemptively.  Decreased Benadryl 25 mg given drowsiness. Stable.    # Hypocalcemia-  ON adjuvant Zometa q 6 months x 3 years [first- May-17th, 2024].   Continue ca+Vit D BID. Vit D FEB 2024- 54; [albumin 3.0]-  Stable.   # Right sided swelling/tightness- s/p  Maureen- Stable. .   # Infusion reaction - ZOmeta [flu like symtoms]- imprived with NSAIDs- Stable.   # Diabetes- BG 148 [Fasting]- recently increased Farxiga 10 mg- [OFF ozempic/metformin- intolerance]- Stable.   # Hypokalemia: continue Kdur BID- Stable.  # PV access: port functional-   Lupron 02/04/2023 - q 81M; Zometa [over 30 mins] q 6 M- #1 10/22/22   # DISPOSITION: # Kadcyla today; lupron today # follow up in 3 week- MD;  port- labs; cbc/cmp;vit D 25-OH levels- Kadcyla - Dr.B    All questions were answered. The  Page/family knows to call the clinic with any problems, questions or concerns.     [name], MD 02/04/2023 9:44 AM

## 2023-02-04 NOTE — Progress Notes (Signed)
 No questions/concerns today.

## 2023-02-04 NOTE — Patient Instructions (Signed)
[facility] [facility]  Discharge Instructions: Thank you for choosing [facility] to provide your oncology and hematology care.  If you have a lab appointment with the Cancer Center, please go directly to the Cancer Center and check in at the registration area.  Wear comfortable clothing and clothing appropriate for easy access to any Portacath or PICC line.   We strive to give you quality time with your provider. You may need to reschedule your appointment if you arrive late (15 or more minutes).  Arriving late affects you and other patients whose appointments are after yours.  Also, if you miss three or more appointments without notifying the office, you may be dismissed from the clinic at the provider's discretion.      For prescription refill requests, have your pharmacy contact our office and allow 72 hours for refills to be completed.    Today you received the following chemotherapy and/or immunotherapy agents kadcyla and lupron      To help prevent nausea and vomiting after your treatment, we encourage you to take your nausea medication as directed.  BELOW ARE SYMPTOMS THAT SHOULD BE REPORTED IMMEDIATELY: *FEVER GREATER THAN 100.4 F (38 C) OR HIGHER *CHILLS OR SWEATING *NAUSEA AND VOMITING THAT IS NOT CONTROLLED WITH YOUR NAUSEA MEDICATION *UNUSUAL SHORTNESS OF BREATH *UNUSUAL BRUISING OR BLEEDING *URINARY PROBLEMS (pain or burning when urinating, or frequent urination) *BOWEL PROBLEMS (unusual diarrhea, constipation, pain near the anus) TENDERNESS IN MOUTH AND THROAT WITH OR WITHOUT PRESENCE OF ULCERS (sore throat, sores in mouth, or a toothache) UNUSUAL RASH, SWELLING OR PAIN  UNUSUAL VAGINAL DISCHARGE OR ITCHING   Items with * indicate a potential emergency and should be followed up as soon as possible or go to the Emergency Department if any problems should occur.  Please show the CHEMOTHERAPY ALERT CARD or IMMUNOTHERAPY ALERT CARD at  check-in to the Emergency Department and triage nurse.  Should you have questions after your visit or need to cancel or reschedule your appointment, please contact [place] [facility]  [phone] and follow the prompts.  Office hours are 8:00 a.m. to 4:30 p.m. Monday - Friday. Please note that voicemails left after 4:00 p.m. may not be returned until the following business day.  We are closed weekends and major holidays. You have access to a nurse at all times for urgent questions. Please call the main number to the clinic [phone] and follow the prompts.  For any non-urgent questions, you may also contact your provider using MyChart. We now offer e-Visits for anyone 2 and older to request care online for non-urgent symptoms. For details visit [email or web address].   Also download the MyChart app! Go to the app store, search "MyChart", open the app, select [place], and log in with your MyChart username and password.

## 2023-02-04 NOTE — Assessment & Plan Note (Addendum)
#   MAY 2023- STAGE III-T4N1 ER/PR positive HER2 POSITIVE RIGHT breast cancer; LN-positive-ER/PR positive her-2 NEGATIVE; # s/p neoadjuvant chemotherapy- TCH plus P x 6 cycles- s/p Right mastectomy-[15th, NOv 2023] ypT2 [1mm]; ypN0.  Given partial response/involvement of the breast skin/multifocal disease- s/p RT- 08/27/2022. atient currently on tamoxifen [DEC-JAN 2024]; + OFS- Lupron every 3 months ]. ON Ado-trastuzumab emtansine  for 14 cycles.   # proceed with Ado-trastuzumab emtansine #11 of planned 14 cycles.  MUGA- June 13th, 2024- Normal LEFT ventricular ejection fraction -58%   stable. Left mammogram- wnl. Stable.   # Mild Thrombocytopenia- > 100- monitor for now. Stable.   # Allergic reaction to Ado-trastuzumab emtansine-fever/rash- currently on Claritin x3 days postchemotherapy; Tylenol preemptively.  Decreased Benadryl 25 mg given drowsiness. Stable.    # Hypocalcemia-  ON adjuvant Zometa q 6 months x 3 years [first- May-17th, 2024].   Continue ca+Vit D BID. Vit D FEB 2024- 54; [albumin 3.0]-  Stable.   # Right sided swelling/tightness- s/p  Maureen- Stable. .   # Infusion reaction - ZOmeta [flu like symtoms]- imprived with NSAIDs- Stable.   # Diabetes- BG 148 [Fasting]- recently increased Farxiga 10 mg- [OFF ozempic/metformin- intolerance]- Stable.   # Hypokalemia: continue Kdur BID- Stable.  # PV access: port functional-   Lupron 02/04/2023 - q 33M; Zometa [over 30 mins] q 6 M- #1 10/22/22   # DISPOSITION: # Kadcyla today; lupron today # follow up in 3 week- MD;  port- labs; cbc/cmp;vit D 25-OH levels- Kadcyla - Dr.B

## 2023-02-14 ENCOUNTER — Ambulatory Visit: Payer: 59 | Attending: Internal Medicine

## 2023-02-14 DIAGNOSIS — L905 Scar conditions and fibrosis of skin: Secondary | ICD-10-CM | POA: Insufficient documentation

## 2023-02-14 DIAGNOSIS — M25611 Stiffness of right shoulder, not elsewhere classified: Secondary | ICD-10-CM | POA: Insufficient documentation

## 2023-02-14 DIAGNOSIS — M6281 Muscle weakness (generalized): Secondary | ICD-10-CM | POA: Insufficient documentation

## 2023-02-14 NOTE — Therapy (Signed)
OUTPATIENT PHYSICAL THERAPY SHOULDER TREATMENT   Patient Name: Marilyn Page MRN: [id number] DOB:[birth year], 47 y.o., female Today's Date: 02/14/2023  END OF SESSION:  PT End of Session - 02/14/23 1732     Visit Number 2    Number of Visits 17    Date for PT Re-Evaluation 03/28/23    PT Start Time 1545    PT Stop Time 1630    PT Time Calculation (min) 45 min    Activity Tolerance Patient tolerated treatment well    Behavior During Therapy [facility] for tasks assessed/performed             Past Medical History:  Diagnosis Date   Anemia    Carcinoma of upper-outer quadrant of right breast in female, estrogen receptor positive (HCC) 10/26/2021   Diabetes mellitus without complication (HCC)    Family history of adverse reaction to anesthesia    grandmother had issues but doesnt know what the issue was, she was older.   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 Axilla Butterfly hydro clip-MACRO METASTATIC MAMMARY CARCINOMA   BREAST BIOPSY Right 04/21/2022   [place] RT PLC BREAST LOC DEV   1ST LESION  INC US GUIDE 04/21/2022 [facility]   BREAST RECONSTRUCTION WITH PLACEMENT OF TISSUE EXPANDER AND FLEX HD (ACELLULAR HYDRATED DERMIS) Right 04/21/2022   Procedure: RIGHT BREAST RECONSTRUCTION WITH PLACEMENT OF TISSUE EXPANDER AND FLEX HD (ACELLULAR HYDRATED DERMIS);  Surgeon: Peggye Form, DO;  Location: [facility] ORS;  Service: Plastics;  Laterality: Right;   CESAREAN SECTION  2005/2008   DILATION AND CURETTAGE OF UTERUS  2003   PORTACATH PLACEMENT Left 11/09/2021   Procedure: INSERTION PORT-A-CATH;  Surgeon: Earline Mayotte, MD;  Location: [facility]  ORS;  Service: General;  Laterality: Left;   REMOVAL OF TISSUE EXPANDER AND PLACEMENT OF IMPLANT Right 06/08/2022   Procedure: REMOVAL OF TISSUE EXPANDER;  Surgeon: Peggye Form, DO;  Location: [facility];  Service: Plastics;  Laterality: Right;   SIMPLE MASTECTOMY WITH AXILLARY SENTINEL NODE BIOPSY Right 04/21/2022   Procedure: SIMPLE MASTECTOMY WITH AXILLARY SENTINEL NODE BIOPSY;  Surgeon: Earline Mayotte, MD;  Location: [facility] ORS;  Service: General;  Laterality: Right;  wire localization of lymph node   SKIN BIOPSY Right 11/09/2021   Procedure: PUNCH BIOPSY SKIN, TATTOO LYMPH NODE RIGHT AXILLA;  Surgeon: Earline Mayotte, MD;  Location: [facility] ORS;  Service: General;  Laterality: Right;   WISDOM TOOTH EXTRACTION     Patient Active Problem List   Diagnosis Date Noted   Acquired absence of right breast 06/18/2022   Low vitamin B12 level 05/24/2022   Hypomagnesemia 05/16/2022   Lactic acid acidosis 03/06/2022   Fever and chills 03/05/2022   COVID-19 virus infection 12/28/2021   Thrombocytopenia (HCC) 12/28/2021   Diabetes mellitus (HCC) 12/28/2021   Febrile neutropenia (HCC) 12/27/2021   Sepsis (HCC) 12/27/2021   Genetic testing 12/01/2021   Carcinoma of upper-outer quadrant of right breast in female, estrogen receptor positive (HCC) 10/26/2021   Uterus, adenomyosis 05/14/2021   ASCUS of cervix with negative high risk HPV 04/23/2021   Iron deficiency anemia due to chronic blood loss 04/23/2021  Type 2 diabetes mellitus without complication, without long-term current use of insulin (HCC) 05/18/2020   RLS (restless legs syndrome) 05/14/2020   Menorrhagia with regular cycle 10/14/2017   BMI 37.0-37.9, adult 10/03/2017   History of gestational diabetes 10/03/2017    PCP: [name], NP   REFERRING PROVIDER: [name], MD   REFERRING DIAG:  [phone] (ICD-10-CM) - Stiffness of right shoulder, not elsewhere classified  C50.411,Z17.0 (ICD-10-CM) - Carcinoma of  upper-outer quadrant of right breast in female, estrogen receptor positive    THERAPY DIAG:  Stiffness of right shoulder, not elsewhere classified  Muscle weakness (generalized)  Rationale for Evaluation and Treatment: Rehabilitation  ONSET DATE: 04/2022   SUBJECTIVE:     From Initial evaluation note 01/31/23                                                                                                                                                                                  SUBJECTIVE STATEMENT: Patient has been seeing [name], OT for lymphedema. Patient reports pain and "pulling" on posterior aspect of upper arm. Patient reports difficulty sleeping, reaching overhead, getting dressed (clasping bra), lifting heavy objects.  Patient has pulley system and red theraband. Has been completing stretches 2x/week and pulley/theraband 2-3x/week.  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 Started 6 weeks of radiation 07/2022 and currently in chemotherapy.   Patient referred to PT for R shoulder pain s/p R mastectomy/removal of expanders and radiation. Patient has seen OT for lymphedema and R shoulder ROM ~1 month for maintenance. OT has provided patient with stretches focused on pec and lat muscle tightness (shoulder abduction and flexion in supine with rotation of hips to the L), child's pose, and AAROM shoulder flexion/abduction on the table.    PAIN:  Are you having pain? Yes: NPRS scale: 2/10 - at worst 8/10 Pain location: R shoulder  Pain description: sharp, achy Aggravating factors: reaching, quick movements with arm  Relieving factors: Ice, massage ball   PRECAUTIONS: None  RED FLAGS: None   WEIGHT BEARING RESTRICTIONS: No  FALLS:  Has patient fallen in last 6 months? 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[place]  PLOF: Independent  PATIENT GOALS:to get this to not hurt   NEXT MD VISIT: 02/04/23 - chemo  OBJECTIVE:   DIAGNOSTIC FINDINGS:  N/A  PATIENT SURVEYS:  FOTO 74 with goal of 78  COGNITION: Overall cognitive status: Within functional limits for tasks assessed     SENSATION: WFL  POSTURE: Rounded shoulders, forward head   UPPER EXTREMITY ROM:   Active ROM Right eval Left eval  Shoulder flexion 120 WFL   Shoulder extension    Shoulder abduction 87 WFL   Shoulder adduction    Shoulder internal rotation 35 WFL  Shoulder external rotation 30 WFL   (Blank rows = not tested)  UPPER EXTREMITY MMT:  MMT Right eval Left eval  Shoulder flexion 4+ 4+  Shoulder extension    Shoulder abduction 4* 4+  Shoulder adduction    Shoulder internal rotation 4* 4+  Shoulder external rotation 4* 4+  Middle trapezius    Lower trapezius    Elbow flexion 4+ 5  Elbow extension 4+ 5  (Blank rows = not tested)  JOINT MOBILITY TESTING:  Joint stiffness noted with AP/PA and inferior mobilization   PALPATION:  Significant trigger points and taut muscle bands at pec minor, R UT, and periscapular region.    TODAY'S TREATMENT:                                                                                                                                         DATE: 02/14/23 Subjective:  Pain 0/10 R shoulder at rest  Objective:  Manual Therapy  Therapeutic Exercise:  HEP provided to patient with focus on R shoulder AAROM and pectoralis major/minor stretching. Able to demonstrate proper technique and form after demonstration and cueing.   PATIENT EDUCATION: Education details: HEP, POC, goals  Person educated: Patient Education method:  Explanation, Demonstration, and Handouts Education comprehension: verbalized understanding  HOME EXERCISE PROGRAM: Access Code: [email or web address] URL: [email or web address] Date: 01/31/2023 Prepared by: Maylon Peppers  Exercises - Pec Minor Stretch  - 2-3 x daily - 7 x weekly - 5-10 reps - 30 hold - Corner Pec Major Stretch  - 2-3 x daily - 7 x weekly - 5-10 reps - 30 hold - Supine Shoulder Flexion with Dowel  - 2-3 x daily - 7 x weekly - 10 reps - 3-5 hold - Supine Shoulder Abduction AAROM with Dowel  - 2-3 x daily - 7 x weekly - 10 reps - 3-5 hold  ASSESSMENT:  CLINICAL IMPRESSION: *** Patient is a 47 y.o. female who was seen today for physical therapy evaluation and treatment for R shoulder stiffness after R mastectomy in 04/2022. Patient demonstrates activity limitations reaching overhead, dressing, and grooming due to impaired R shoulder ROM, R UE weakness, and pain with movement. Significant trigger points and taut muscle bands notable in R pec minor, R UT, and R periscapular region. Initiated HEP with focus on R shoulder AAROM and pec minor/major stretching. Patient will benefit from skilled PT interventions to address listed impairments to improve ADL tolerance, pain reduction, and improve pain free  R shoulder ROM.   OBJECTIVE IMPAIRMENTS: decreased activity tolerance, decreased ROM, decreased strength, hypomobility, impaired flexibility, impaired UE functional use, and postural dysfunction.   ACTIVITY LIMITATIONS: carrying, lifting, bathing, dressing, reach over head, and hygiene/grooming  PARTICIPATION LIMITATIONS: laundry, shopping, community activity, occupation, and yard work  PERSONAL FACTORS: Past/current experiences, Profession, Time since onset of injury/illness/exacerbation, and 1 comorbidity: breast cancer s/p mastectomy  are also affecting patient's functional outcome.   REHAB POTENTIAL: Good  CLINICAL DECISION MAKING: Stable/uncomplicated  EVALUATION  COMPLEXITY: Moderate   GOALS: Goals reviewed with patient? Yes  SHORT TERM GOALS: Target date: 02/28/2023  Patient will be independent in HEP to improve strength/ROM for better functional independence with ADLs. Baseline: 8/26: HEP initiated  Goal status: INITIAL  LONG TERM GOALS: Target date: 03/29/2023  Patient will increase FOTO score to equal to or greater than 69 to demonstrate statistically significant improvement in mobility and quality of life.  Baseline: 8/26: 54 Goal status: INITIAL  2.  Patient will demonstrate adequate R shoulder ROM and strength to be able to clasp bra, dress independently, and perform overhead activities with pain less than 2/10.  Baseline: 8/26: unable to clasp bra with IR limitation  Goal status: INITIAL  3.  Patient will improve B UE strength by 1/2 MMT grade to improve ability to complete iADLs and job related tasks.  Baseline: 8/26: see above Goal status: INITIAL  4.  Patient will report a worst pain of 2/10 on NRPS in R shoulder to improve tolerance with ADLs and reduced symptoms with activities.  Baseline: 8/26: worst pain 8/10 Goal status: INITIAL  PLAN:  PT FREQUENCY: 1-2x/week  PT DURATION: 8 weeks  PLANNED INTERVENTIONS: Therapeutic exercises, Therapeutic activity, Patient/Family education, Self Care, Joint mobilization, Joint manipulation, DME instructions, Dry Needling, Spinal mobilization, Cryotherapy, Moist heat, Taping, Traction, and Manual therapy  PLAN FOR NEXT SESSION: HEP review, manual therapy, dry needling as warranted   Max Fickle, PT, DPT, OCS Physical Therapist - [facility]  [facility]  02/14/2023, 5:33 PM

## 2023-02-16 ENCOUNTER — Ambulatory Visit: Payer: 59

## 2023-02-16 DIAGNOSIS — M25611 Stiffness of right shoulder, not elsewhere classified: Secondary | ICD-10-CM | POA: Diagnosis not present

## 2023-02-16 DIAGNOSIS — M6281 Muscle weakness (generalized): Secondary | ICD-10-CM

## 2023-02-17 NOTE — Therapy (Signed)
OUTPATIENT PHYSICAL THERAPY SHOULDER TREATMENT   Patient Name: Marilyn Page MRN: [id number] DOB:01-07-1976, 47 y.o., female Today's Date: 02/17/2023  END OF SESSION:  PT End of Session - 02/16/23 1121     Visit Number 3    Number of Visits 17    Date for PT Re-Evaluation 03/28/23    PT Start Time 1245    PT Stop Time 1330    PT Time Calculation (min) 45 min    Activity Tolerance Patient tolerated treatment well    Behavior During Therapy [facility] for tasks assessed/performed             Past Medical History:  Diagnosis Date   Anemia    Carcinoma of upper-outer quadrant of right breast in female, estrogen receptor positive (HCC) 10/26/2021   Diabetes mellitus without complication (HCC)    Family history of adverse reaction to anesthesia    grandmother had issues but doesnt know what the issue was, she was older.   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 Axilla Butterfly hydro clip-MACRO METASTATIC MAMMARY CARCINOMA   BREAST BIOPSY Right 04/21/2022   [place] RT PLC BREAST LOC DEV   1ST LESION  INC US GUIDE 04/21/2022 [facility]   BREAST RECONSTRUCTION WITH PLACEMENT OF TISSUE EXPANDER AND FLEX HD (ACELLULAR HYDRATED DERMIS) Right 04/21/2022   Procedure: RIGHT BREAST RECONSTRUCTION WITH PLACEMENT OF TISSUE EXPANDER AND FLEX HD (ACELLULAR HYDRATED DERMIS);  Surgeon: Peggye Form, DO;  Location: [facility] ORS;  Service: Plastics;  Laterality: Right;   CESAREAN SECTION  2005/2008   DILATION AND CURETTAGE OF UTERUS  2003   PORTACATH PLACEMENT Left 11/09/2021   Procedure: INSERTION PORT-A-CATH;  Surgeon: Earline Mayotte, MD;  Location: [facility]  ORS;  Service: General;  Laterality: Left;   REMOVAL OF TISSUE EXPANDER AND PLACEMENT OF IMPLANT Right 06/08/2022   Procedure: REMOVAL OF TISSUE EXPANDER;  Surgeon: Peggye Form, DO;  Location: [facility] OR;  Service: Plastics;  Laterality: Right;   SIMPLE MASTECTOMY WITH AXILLARY SENTINEL NODE BIOPSY Right 04/21/2022   Procedure: SIMPLE MASTECTOMY WITH AXILLARY SENTINEL NODE BIOPSY;  Surgeon: Earline Mayotte, MD;  Location: [facility] ORS;  Service: General;  Laterality: Right;  wire localization of lymph node   SKIN BIOPSY Right 11/09/2021   Procedure: PUNCH BIOPSY SKIN, TATTOO LYMPH NODE RIGHT AXILLA;  Surgeon: Earline Mayotte, MD;  Location: [facility] ORS;  Service: General;  Laterality: Right;   WISDOM TOOTH EXTRACTION     Patient Active Problem List   Diagnosis Date Noted   Acquired absence of right breast 06/18/2022   Low vitamin B12 level 05/24/2022   Hypomagnesemia 05/16/2022   Lactic acid acidosis 03/06/2022   Fever and chills 03/05/2022   COVID-19 virus infection 12/28/2021   Thrombocytopenia (HCC) 12/28/2021   Diabetes mellitus (HCC) 12/28/2021   Febrile neutropenia (HCC) 12/27/2021   Sepsis (HCC) 12/27/2021   Genetic testing 12/01/2021   Carcinoma of upper-outer quadrant of right breast in female, estrogen receptor positive (HCC) 10/26/2021   Uterus, adenomyosis 05/14/2021   ASCUS of cervix with negative high risk HPV 04/23/2021   Iron deficiency anemia due to chronic blood loss 04/23/2021  Type 2 diabetes mellitus without complication, without long-term current use of insulin (HCC) 05/18/2020   RLS (restless legs syndrome) 05/14/2020   Menorrhagia with regular cycle 10/14/2017   BMI 37.0-37.9, adult 10/03/2017   History of gestational diabetes 10/03/2017    PCP: [name], NP   REFERRING PROVIDER: [name], MD   REFERRING DIAG:  [phone] (ICD-10-CM) - Stiffness of right shoulder, not elsewhere classified  C50.411,Z17.0 (ICD-10-CM) - Carcinoma of  upper-outer quadrant of right breast in female, estrogen receptor positive    THERAPY DIAG:  Stiffness of right shoulder, not elsewhere classified  Muscle weakness (generalized)  Rationale for Evaluation and Treatment: Rehabilitation  ONSET DATE: 04/2022   SUBJECTIVE:     From Initial evaluation note 01/31/23                                                                                                                                                                                  SUBJECTIVE STATEMENT: Patient has been seeing [name], OT for lymphedema. Patient reports pain and "pulling" on posterior aspect of upper arm. Patient reports difficulty sleeping, reaching overhead, getting dressed (clasping bra), lifting heavy objects.  Patient has pulley system and red theraband. Has been completing stretches 2x/week and pulley/theraband 2-3x/week.  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 Started 6 weeks of radiation 07/2022 and currently in chemotherapy.   Patient referred to PT for R shoulder pain s/p R mastectomy/removal of expanders and radiation. Patient has seen OT for lymphedema and R shoulder ROM ~1 month for maintenance. OT has provided patient with stretches focused on pec and lat muscle tightness (shoulder abduction and flexion in supine with rotation of hips to the L), child's pose, and AAROM shoulder flexion/abduction on the table.    PAIN:  Are you having pain? Yes: NPRS scale: 2/10 - at worst 8/10 Pain location: R shoulder  Pain description: sharp, achy Aggravating factors: reaching, quick movements with arm  Relieving factors: Ice, massage ball   PRECAUTIONS: None  RED FLAGS: None   WEIGHT BEARING RESTRICTIONS: No  FALLS:  Has patient fallen in last 6 months? 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[place]  PLOF: Independent  PATIENT GOALS:to get this to not hurt   NEXT MD VISIT: 02/04/23 - chemo  OBJECTIVE:   DIAGNOSTIC FINDINGS:  N/A  PATIENT SURVEYS:  FOTO 67 with goal of 66  COGNITION: Overall cognitive status: Within functional limits for tasks assessed     SENSATION: WFL  POSTURE: Rounded shoulders, forward head   UPPER EXTREMITY ROM:   Active ROM Right eval Left eval  Shoulder flexion 120 WFL   Shoulder extension    Shoulder abduction 87 WFL   Shoulder adduction    Shoulder internal rotation 35 WFL  Shoulder external rotation 30 WFL   (Blank rows = not tested)  UPPER EXTREMITY MMT:  MMT Right eval Left eval  Shoulder flexion 4+ 4+  Shoulder extension    Shoulder abduction 4* 4+  Shoulder adduction    Shoulder internal rotation 4* 4+  Shoulder external rotation 4* 4+  Middle trapezius    Lower trapezius    Elbow flexion 4+ 5  Elbow extension 4+ 5  (Blank rows = not tested)  JOINT MOBILITY TESTING:  Joint stiffness noted with AP/PA and inferior mobilization   PALPATION:  Significant trigger points and taut muscle bands at pec minor, R UT, and periscapular region.    TODAY'S TREATMENT:                                                                                                                                         DATE: 02/17/23 Subjective: Pt reports R shoulder is sore along top/lateral area when she reaches her arm up overhead.  The pain occurs at end range of reaching.  Pain 0/10 R shoulder at rest  Objective: AROM: R shoulder flexion 120, abd 87, HBB thumb to mid glute max  Manual Therapy: Jt mobilizations: R GH A/P and inf glides Gr II/III in various positions of flexion, abd (pt in supine hooklying  position) Assessed soft tissue mobility around shoulder/scapulothoracic region (pt donned a gown for PT visual inspection/palpation with pt consent)  (+) soft tissue restrictions noted in axillary region/medial humerus, latissimus dorsi, pec major/minor, subscapularis, rhomboid/UT, R anterior ribcage along mastectomy scar site (+) diminished sensation to light touch along radiation site  STM/manual stretching for proximal lat, subscap, pec major/minor GH joint mobs: inf glides and A/P glides in various ranges of motion for flexion, abd, ER   Therapeutic Exercise: PROM flexion, abd, ER, IR, focusing on sustained LLLD type end range stretches- focused on flexion and abd  Wall slide flexion with washcloth: x10 Reviewed AAROM with cane for flexion, abd (current HEP) Doorway pec stretch: 30 seconds x 4 Seated ER stretch with forearm on table, lean body forward: 30 seconds x 4  Pre-tx shoulder PROM flexion: 128 deg (pt notes pain at end range) Post-tx shoulder PROM flexion: 133 deg (pt notes pain at end range)  PATIENT EDUCATION: Education details: HEP, POC, goals  Person educated: Patient Education method: Explanation, Demonstration, and Handouts Education comprehension: verbalized understanding  HOME EXERCISE PROGRAM: Access Code: [email or web address] URL: [email or web address] Date: 01/31/2023 Prepared by: Inez Pilgrim  Walker  Exercises - Pec Minor Stretch  - 2-3 x daily - 7 x weekly - 5-10 reps - 30 hold - Corner Pec Major Stretch  - 2-3 x daily - 7 x weekly - 5-10 reps - 30 hold - Supine Shoulder Flexion with Dowel  - 2-3 x daily - 7 x weekly - 10 reps - 3-5 hold - Supine Shoulder Abduction AAROM with Dowel  - 2-3 x daily - 7 x weekly - 10 reps - 3-5 hold  ASSESSMENT:  CLINICAL IMPRESSION: Pt responded well to manual therapy for soft tissue and GH joint hypomobility today with an increase in R shoulder PROM flexion of 5 degrees within session.  Plan to continue with manual  therapy and ROM at next visit; and further assess soft tissue mobility.  Patient will benefit from skilled PT interventions to address listed impairments to improve ADL tolerance, pain reduction, and improve pain free R shoulder ROM.   OBJECTIVE IMPAIRMENTS: decreased activity tolerance, decreased ROM, decreased strength, hypomobility, impaired flexibility, impaired UE functional use, and postural dysfunction.   ACTIVITY LIMITATIONS: carrying, lifting, bathing, dressing, reach over head, and hygiene/grooming  PARTICIPATION LIMITATIONS: laundry, shopping, community activity, occupation, and yard work  PERSONAL FACTORS: Past/current experiences, Profession, Time since onset of injury/illness/exacerbation, and 1 comorbidity: breast cancer s/p mastectomy  are also affecting patient's functional outcome.   REHAB POTENTIAL: Good  CLINICAL DECISION MAKING: Stable/uncomplicated  EVALUATION COMPLEXITY: Moderate   GOALS: Goals reviewed with patient? Yes  SHORT TERM GOALS: Target date: 02/28/2023  Patient will be independent in HEP to improve strength/ROM for better functional independence with ADLs. Baseline: 8/26: HEP initiated  Goal status: INITIAL  LONG TERM GOALS: Target date: 03/29/2023  Patient will increase FOTO score to equal to or greater than 69 to demonstrate statistically significant improvement in mobility and quality of life.  Baseline: 8/26: 54 Goal status: INITIAL  2.  Patient will demonstrate adequate R shoulder ROM and strength to be able to clasp bra, dress independently, and perform overhead activities with pain less than 2/10.  Baseline: 8/26: unable to clasp bra with IR limitation  Goal status: INITIAL  3.  Patient will improve B UE strength by 1/2 MMT grade to improve ability to complete iADLs and job related tasks.  Baseline: 8/26: see above Goal status: INITIAL  4.  Patient will report a worst pain of 2/10 on NRPS in R shoulder to improve tolerance with ADLs and  reduced symptoms with activities.  Baseline: 8/26: worst pain 8/10 Goal status: INITIAL  PLAN:  PT FREQUENCY: 1-2x/week  PT DURATION: 8 weeks  PLANNED INTERVENTIONS: Therapeutic exercises, Therapeutic activity, Patient/Family education, Self Care, Joint mobilization, Joint manipulation, DME instructions, Dry Needling, Spinal mobilization, Cryotherapy, Moist heat, Taping, Traction, and Manual therapy  PLAN FOR NEXT SESSION: HEP review, manual therapy- Soft tissue mob axilla (lats/subscap/pec)/GH joint mob, scapulothoracic mob, continue AAROM for shoulder ROM deficits; consider IR strap stretch behind back, adding lateral trunk flexion, scapular retractions   Max Fickle, PT, DPT, OCS Physical Therapist - [facility]  [facility]  02/17/2023, 11:22 AM

## 2023-02-21 ENCOUNTER — Ambulatory Visit: Payer: 59

## 2023-02-21 DIAGNOSIS — M25611 Stiffness of right shoulder, not elsewhere classified: Secondary | ICD-10-CM | POA: Diagnosis not present

## 2023-02-21 DIAGNOSIS — M6281 Muscle weakness (generalized): Secondary | ICD-10-CM

## 2023-02-21 DIAGNOSIS — L905 Scar conditions and fibrosis of skin: Secondary | ICD-10-CM

## 2023-02-21 NOTE — Therapy (Signed)
OUTPATIENT PHYSICAL THERAPY SHOULDER TREATMENT   Patient Name: Marilyn Page MRN: [id number] DOB:21-Apr-1976, 47 y.o., female Today's Date: 02/21/2023  END OF SESSION:  PT End of Session - 02/21/23 1528     Visit Number 4    Number of Visits 17    Date for PT Re-Evaluation 03/28/23    PT Start Time 1545    PT Stop Time 1628    PT Time Calculation (min) 43 min    Activity Tolerance Patient tolerated treatment well    Behavior During Therapy [facility] for tasks assessed/performed              Past Medical History:  Diagnosis Date   Anemia    Carcinoma of upper-outer quadrant of right breast in female, estrogen receptor positive (HCC) 10/26/2021   Diabetes mellitus without complication (HCC)    Family history of adverse reaction to anesthesia    grandmother had issues but doesnt know what the issue was, she was older.   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 Axilla Butterfly hydro clip-MACRO METASTATIC MAMMARY CARCINOMA   BREAST BIOPSY Right 04/21/2022   [place] RT PLC BREAST LOC DEV   1ST LESION  INC US GUIDE 04/21/2022 [facility]   BREAST RECONSTRUCTION WITH PLACEMENT OF TISSUE EXPANDER AND FLEX HD (ACELLULAR HYDRATED DERMIS) Right 04/21/2022   Procedure: RIGHT BREAST RECONSTRUCTION WITH PLACEMENT OF TISSUE EXPANDER AND FLEX HD (ACELLULAR HYDRATED DERMIS);  Surgeon: Peggye Form, DO;  Location: [facility]S;  Service: Plastics;  Laterality: Right;   CESAREAN SECTION  2005/2008   DILATION AND CURETTAGE OF UTERUS  2003   PORTACATH PLACEMENT Left 11/09/2021   Procedure: INSERTION PORT-A-CATH;  Surgeon: Earline Mayotte, MD;  Location:  [facility];  Service: General;  Laterality: Left;   REMOVAL OF TISSUE EXPANDER AND PLACEMENT OF IMPLANT Right 06/08/2022   Procedure: REMOVAL OF TISSUE EXPANDER;  Surgeon: Peggye Form, DO;  Location: [facility] OR;  Service: Plastics;  Laterality: Right;   SIMPLE MASTECTOMY WITH AXILLARY SENTINEL NODE BIOPSY Right 04/21/2022   Procedure: SIMPLE MASTECTOMY WITH AXILLARY SENTINEL NODE BIOPSY;  Surgeon: Earline Mayotte, MD;  Location: [facility];  Service: General;  Laterality: Right;  wire localization of lymph node   SKIN BIOPSY Right 11/09/2021   Procedure: PUNCH BIOPSY SKIN, TATTOO LYMPH NODE RIGHT AXILLA;  Surgeon: Earline Mayotte, MD;  Location: [facility] ORS;  Service: General;  Laterality: Right;   WISDOM TOOTH EXTRACTION     Patient Active Problem List   Diagnosis Date Noted   Acquired absence of right breast 06/18/2022   Low vitamin B12 level 05/24/2022   Hypomagnesemia 05/16/2022   Lactic acid acidosis 03/06/2022   Fever and chills 03/05/2022   COVID-19 virus infection 12/28/2021   Thrombocytopenia (HCC) 12/28/2021   Diabetes mellitus (HCC) 12/28/2021   Febrile neutropenia (HCC) 12/27/2021   Sepsis (HCC) 12/27/2021   Genetic testing 12/01/2021   Carcinoma of upper-outer quadrant of right breast in female, estrogen receptor positive (HCC) 10/26/2021   Uterus, adenomyosis 05/14/2021   ASCUS of cervix with negative high risk HPV 04/23/2021   Iron deficiency anemia due to chronic blood loss  04/23/2021   Type 2 diabetes mellitus without complication, without long-term current use of insulin (HCC) 05/18/2020   RLS (restless legs syndrome) 05/14/2020   Menorrhagia with regular cycle 10/14/2017   BMI 37.0-37.9, adult 10/03/2017   History of gestational diabetes 10/03/2017    PCP: [name], NP   REFERRING PROVIDER: [name], MD   REFERRING DIAG:  [id number] (ICD-10-CM) - Stiffness of right shoulder, not elsewhere classified  C50.411,Z17.0 (ICD-10-CM) - Carcinoma  of upper-outer quadrant of right breast in female, estrogen receptor positive    THERAPY DIAG:  Stiffness of right shoulder, not elsewhere classified  Muscle weakness (generalized)  Scar tissue  Rationale for Evaluation and Treatment: Rehabilitation  ONSET DATE: 04/2022   SUBJECTIVE:     From Initial evaluation note 01/31/23                                                                                                                                                                                  SUBJECTIVE STATEMENT: Patient has been seeing [name], OT for lymphedema. Patient reports pain and "pulling" on posterior aspect of upper arm. Patient reports difficulty sleeping, reaching overhead, getting dressed (clasping bra), lifting heavy objects.  Patient has pulley system and red theraband. Has been completing stretches 2x/week and pulley/theraband 2-3x/week.  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 Started 6 weeks of radiation 07/2022 and currently in chemotherapy.   Patient referred to PT for R shoulder pain s/p R mastectomy/removal of expanders and radiation. Patient has seen OT for lymphedema and R shoulder ROM ~1 month for maintenance. OT has provided patient with stretches focused on pec and lat muscle tightness (shoulder abduction and flexion in supine with rotation of hips to the L), child's pose, and AAROM shoulder flexion/abduction on the table.    PAIN:  Are you having pain? Yes: NPRS scale: 2/10 - at worst 8/10 Pain location: R shoulder  Pain description: sharp, achy Aggravating factors: reaching, quick movements with arm  Relieving factors: Ice, massage ball   PRECAUTIONS: None  RED FLAGS: None   WEIGHT BEARING RESTRICTIONS: No  FALLS:  Has patient fallen in last 6 months? 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 [place]  PLOF: Independent  PATIENT GOALS:to get this to not hurt   NEXT MD VISIT: 02/04/23 - chemo  OBJECTIVE:   DIAGNOSTIC FINDINGS:  N/A  PATIENT SURVEYS:  FOTO 35 with goal of 92  COGNITION: Overall cognitive status: Within functional limits for tasks assessed     SENSATION: WFL  POSTURE: Rounded shoulders, forward head   UPPER EXTREMITY ROM:   Active ROM Right eval Left eval  Shoulder flexion 120 WFL   Shoulder extension    Shoulder abduction 87 WFL   Shoulder adduction    Shoulder internal rotation 35 WFL  Shoulder external rotation 30 WFL   (Blank rows = not tested)  UPPER EXTREMITY MMT:  MMT Right eval Left eval  Shoulder flexion 4+ 4+  Shoulder extension    Shoulder abduction 4* 4+  Shoulder adduction    Shoulder internal rotation 4* 4+  Shoulder external rotation 4* 4+  Middle trapezius    Lower trapezius    Elbow flexion 4+ 5  Elbow extension 4+ 5  (Blank rows = not tested)  JOINT MOBILITY TESTING:  Joint stiffness noted with AP/PA and inferior mobilization   PALPATION:  Significant trigger points and taut muscle bands at pec minor, R UT, and periscapular region.    TODAY'S TREATMENT:                                                                                                                                         DATE: 02/21/23 Subjective: No new complaints on arrival.    Pain 0/10 R shoulder at rest  Objective:  Manual Therapy: Jt mobilizations: R GH A/P and inf glides Gr II/III in various positions of flexion, abd (pt in supine hooklying position)  STM/manual stretching for proximal lat, subscap, pec major/minor GH joint mobs: inf glides and A/P glides in various ranges of motion for  flexion, abd, ER   Therapeutic Exercise: AAROM flexion, abd with dowel x 10 each Supine R shoulder flexion, abduction 2# DB 2 x 10   Wall slide flexion with bolster: x10 Doorway pec stretch: 30 seconds x 4 Standing row BTB 2 x 15 Standing IR with towel roll at elbow BTB 2 x 15 Standing ER with towel roll at elbow RTB 2 x 15    PATIENT EDUCATION: Education details: HEP, POC, goals  Person educated: Patient Education method: Explanation, Demonstration, and Handouts Education comprehension: verbalized understanding  HOME EXERCISE PROGRAM: Access Code: [email or web address] URL: [email or web address] Date: 01/31/2023 Prepared by: Maylon Peppers  Exercises - Pec Minor Stretch  - 2-3 x daily - 7 x weekly - 5-10 reps - 30 hold - Corner Pec Major Stretch  - 2-3 x daily - 7 x weekly - 5-10 reps - 30 hold - Supine Shoulder Flexion with Dowel  - 2-3 x daily - 7 x weekly - 10 reps - 3-5 hold - Supine Shoulder Abduction AAROM with Dowel  - 2-3 x daily - 7 x weekly - 10 reps - 3-5 hold  ASSESSMENT:  CLINICAL IMPRESSION:  Patient arrives to treatment session motivated to participate with no pain. Session focused on manual stretching and mobilizations along with light strengthening. Tolerated session well. Patient will benefit from skilled PT interventions to address listed impairments to improve ADL tolerance, pain reduction, and improve pain free R shoulder ROM.   OBJECTIVE IMPAIRMENTS: decreased activity tolerance, decreased ROM, decreased strength, hypomobility, impaired flexibility, impaired UE functional use, and postural dysfunction.   ACTIVITY LIMITATIONS: carrying, lifting, bathing, dressing, reach over head, and hygiene/grooming  PARTICIPATION LIMITATIONS: laundry, shopping, community activity, occupation, and yard work  PERSONAL FACTORS: Past/current experiences, Profession, Time since onset of injury/illness/exacerbation, and 1 comorbidity: breast cancer s/p mastectomy  are  also affecting patient's functional outcome.   REHAB POTENTIAL: Good  CLINICAL DECISION MAKING: Stable/uncomplicated  EVALUATION COMPLEXITY: Moderate   GOALS: Goals reviewed with patient? Yes  SHORT TERM GOALS: Target date: 02/28/2023  Patient will be independent in HEP to improve strength/ROM for better functional independence with ADLs. Baseline: 8/26: HEP initiated  Goal status: INITIAL  LONG TERM GOALS: Target date: 03/29/2023  Patient will increase FOTO score to equal to or greater than 69 to demonstrate statistically significant improvement in mobility and quality of life.  Baseline: 8/26: 54 Goal status: INITIAL  2.  Patient will demonstrate adequate R shoulder ROM and strength to be able to clasp bra, dress independently, and perform overhead activities with pain less than 2/10.  Baseline: 8/26: unable to clasp bra with IR limitation  Goal status: INITIAL  3.  Patient will improve B UE strength by 1/2 MMT grade to improve ability to complete iADLs and job related tasks.  Baseline: 8/26: see above Goal status: INITIAL  4.  Patient will report a worst pain of 2/10 on NRPS in R shoulder to improve tolerance with ADLs and reduced symptoms with activities.  Baseline: 8/26: worst pain 8/10 Goal status: INITIAL  PLAN:  PT FREQUENCY: 1-2x/week  PT DURATION: 8 weeks  PLANNED INTERVENTIONS: Therapeutic exercises, Therapeutic activity, Patient/Family education, Self Care, Joint mobilization, Joint manipulation, DME instructions, Dry Needling, Spinal mobilization, Cryotherapy, Moist heat, Taping, Traction, and Manual therapy  PLAN FOR NEXT SESSION: HEP review, manual therapy- Soft tissue mob axilla (lats/subscap/pec)/GH joint mob, scapulothoracic mob, continue AAROM for shoulder ROM deficits; consider IR strap stretch behind back, adding lateral trunk flexion, scapular retractions   Maylon Peppers, PT, DPT  Physical Therapist - [facility]  [facility]  02/21/2023, 4:24 PM

## 2023-02-23 ENCOUNTER — Ambulatory Visit: Payer: 59

## 2023-02-23 DIAGNOSIS — M6281 Muscle weakness (generalized): Secondary | ICD-10-CM

## 2023-02-23 DIAGNOSIS — M25611 Stiffness of right shoulder, not elsewhere classified: Secondary | ICD-10-CM

## 2023-02-23 NOTE — Therapy (Signed)
OUTPATIENT PHYSICAL THERAPY SHOULDER TREATMENT   Patient Name: Marilyn Page MRN: [id number] DOB:1976-02-07, 47 y.o., female Today's Date: 02/23/2023  END OF SESSION:  PT End of Session - 02/23/23 1416     Visit Number 5    Number of Visits 17    Date for PT Re-Evaluation 03/28/23    PT Start Time 1415    PT Stop Time 1500    PT Time Calculation (min) 45 min    Activity Tolerance Patient tolerated treatment well    Behavior During Therapy [facility] for tasks assessed/performed              Past Medical History:  Diagnosis Date   Anemia    Carcinoma of upper-outer quadrant of right breast in female, estrogen receptor positive (HCC) 10/26/2021   Diabetes mellitus without complication (HCC)    Family history of adverse reaction to anesthesia    grandmother had issues but doesnt know what the issue was, she was older.   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 Axilla Butterfly hydro clip-MACRO METASTATIC MAMMARY CARCINOMA   BREAST BIOPSY Right 04/21/2022   [place] RT PLC BREAST LOC DEV   1ST LESION  INC US GUIDE 04/21/2022 [facility]   BREAST RECONSTRUCTION WITH PLACEMENT OF TISSUE EXPANDER AND FLEX HD (ACELLULAR HYDRATED DERMIS) Right 04/21/2022   Procedure: RIGHT BREAST RECONSTRUCTION WITH PLACEMENT OF TISSUE EXPANDER AND FLEX HD (ACELLULAR HYDRATED DERMIS);  Surgeon: Peggye Form, DO;  Location: [facility];  Service: Plastics;  Laterality: Right;   CESAREAN SECTION  2005/2008   DILATION AND CURETTAGE OF UTERUS  2003   PORTACATH PLACEMENT Left 11/09/2021   Procedure: INSERTION PORT-A-CATH;  Surgeon: Earline Mayotte, MD;  Location:  [facility];  Service: General;  Laterality: Left;   REMOVAL OF TISSUE EXPANDER AND PLACEMENT OF IMPLANT Right 06/08/2022   Procedure: REMOVAL OF TISSUE EXPANDER;  Surgeon: Peggye Form, DO;  Location: [facility];  Service: Plastics;  Laterality: Right;   SIMPLE MASTECTOMY WITH AXILLARY SENTINEL NODE BIOPSY Right 04/21/2022   Procedure: SIMPLE MASTECTOMY WITH AXILLARY SENTINEL NODE BIOPSY;  Surgeon: Earline Mayotte, MD;  Location: [facility];  Service: General;  Laterality: Right;  wire localization of lymph node   SKIN BIOPSY Right 11/09/2021   Procedure: PUNCH BIOPSY SKIN, TATTOO LYMPH NODE RIGHT AXILLA;  Surgeon: Earline Mayotte, MD;  Location: [facility] ORS;  Service: General;  Laterality: Right;   WISDOM TOOTH EXTRACTION     Patient Active Problem List   Diagnosis Date Noted   Acquired absence of right breast 06/18/2022   Low vitamin B12 level 05/24/2022   Hypomagnesemia 05/16/2022   Lactic acid acidosis 03/06/2022   Fever and chills 03/05/2022   COVID-19 virus infection 12/28/2021   Thrombocytopenia (HCC) 12/28/2021   Diabetes mellitus (HCC) 12/28/2021   Febrile neutropenia (HCC) 12/27/2021   Sepsis (HCC) 12/27/2021   Genetic testing 12/01/2021   Carcinoma of upper-outer quadrant of right breast in female, estrogen receptor positive (HCC) 10/26/2021   Uterus, adenomyosis 05/14/2021   ASCUS of cervix with negative high risk HPV 04/23/2021   Iron deficiency anemia due to chronic blood loss  04/23/2021   Type 2 diabetes mellitus without complication, without long-term current use of insulin (HCC) 05/18/2020   RLS (restless legs syndrome) 05/14/2020   Menorrhagia with regular cycle 10/14/2017   BMI 37.0-37.9, adult 10/03/2017   History of gestational diabetes 10/03/2017    PCP: [name], NP   REFERRING PROVIDER: [name], MD   REFERRING DIAG:  [id number] (ICD-10-CM) - Stiffness of right shoulder, not elsewhere classified  C50.411,Z17.0 (ICD-10-CM) - Carcinoma  of upper-outer quadrant of right breast in female, estrogen receptor positive    THERAPY DIAG:  Stiffness of right shoulder, not elsewhere classified  Muscle weakness (generalized)  Rationale for Evaluation and Treatment: Rehabilitation  ONSET DATE: 04/2022   SUBJECTIVE:     From Initial evaluation note 01/31/23                                                                                                                                                                                  SUBJECTIVE STATEMENT: Patient has been seeing [name], OT for lymphedema. Patient reports pain and "pulling" on posterior aspect of upper arm. Patient reports difficulty sleeping, reaching overhead, getting dressed (clasping bra), lifting heavy objects.  Patient has pulley system and red theraband. Has been completing stretches 2x/week and pulley/theraband 2-3x/week.  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 Started 6 weeks of radiation 07/2022 and currently in chemotherapy.   Patient referred to PT for R shoulder pain s/p R mastectomy/removal of expanders and radiation. Patient has seen OT for lymphedema and R shoulder ROM ~1 month for maintenance. OT has provided patient with stretches focused on pec and lat muscle tightness (shoulder abduction and flexion in supine with rotation of hips to the L), child's pose, and AAROM shoulder flexion/abduction on the table.    PAIN:  Are you having pain? Yes: NPRS scale: 2/10 - at worst 8/10 Pain location: R shoulder  Pain description: sharp, achy Aggravating factors: reaching, quick movements with arm  Relieving factors: Ice, massage ball   PRECAUTIONS: None  RED FLAGS: None   WEIGHT BEARING RESTRICTIONS: No  FALLS:  Has patient fallen in last 6 months? 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[place]  PLOF: Independent  PATIENT GOALS:to get this to not hurt   NEXT MD VISIT: 02/04/23 - chemo  OBJECTIVE:  DIAGNOSTIC FINDINGS:  N/A  PATIENT SURVEYS:  FOTO 69 with goal of 77  COGNITION: Overall cognitive status: Within functional limits for tasks assessed     SENSATION: WFL  POSTURE: Rounded shoulders, forward head   UPPER EXTREMITY ROM:   Active ROM Right eval Left eval  Shoulder flexion 120 WFL   Shoulder extension    Shoulder abduction 87 WFL   Shoulder adduction    Shoulder internal rotation 35 WFL  Shoulder external rotation 30 WFL   (Blank rows = not tested)  UPPER EXTREMITY MMT:  MMT Right eval Left eval  Shoulder flexion 4+ 4+  Shoulder extension    Shoulder abduction 4* 4+  Shoulder adduction    Shoulder internal rotation 4* 4+  Shoulder external rotation 4* 4+  Middle trapezius    Lower trapezius    Elbow flexion 4+ 5  Elbow extension 4+ 5  (Blank rows = not tested)  JOINT MOBILITY TESTING:  Joint stiffness noted with AP/PA and inferior mobilization   PALPATION:  Significant trigger points and taut muscle bands at pec minor, R UT, and periscapular region.    TODAY'S TREATMENT:                                                                                                                                         DATE: 02/23/23 Subjective: No new complaints on arrival.  She reports she did well with tx on Monday, felt some soreness afterward.  Reports working on her HEP consistently.  Feels like she can reach into a high cabinet a little easier without having to go up on her toes as far.    Pain 0/10 R shoulder at rest  Objective: ROM pre manual tx: abd 87, HBB thumb to PSIS  Manual Therapy: 25 min Jt  mobilizations: R GH distraction, A/P and inf glides Gr III/IV in various positions of flexion, abd (pt in supine hooklying position), seated distraction with IR HBB GR III/IV  PROM flexion, abd, ER, IR ROM measurements post-manual tx: flexion 133 deg, abd 105 deg, HBB thumb to L4  Therapeutic Exercise: 20 min AAROM flexion, abd with dowel x 10 each Towel IR hand behind back stretch 3x 20 seconds, 2x 20 seconds using BTB Standing row BTB 2 x 15 Standing IR with towel roll at elbow BTB 2 x 15 Standing ER with towel roll at elbow RTB 2 x 15   Updated HEP- see below  Not today: Supine R shoulder flexion, abduction 2# DB 2 x 10   Wall slide flexion with bolster: x10 Doorway pec stretch: 30 seconds x 4  PATIENT EDUCATION: Education details: HEP, POC, goals  Person educated: Patient Education method: Explanation, Demonstration, and Handouts Education comprehension: verbalized understanding  HOME EXERCISE PROGRAM: Access Code: [email or web address] URL: [email or web address] Date: 02/23/2023 Prepared by: Max Fickle  Exercises - Pec Minor Stretch  - 2-3 x daily - 7 x  weekly - 5-10 reps - 30 hold - Corner Pec Major Stretch  - 2-3 x daily - 7 x weekly - 5-10 reps - 30 hold - Supine Shoulder Flexion with Dowel  - 2-3 x daily - 7 x weekly - 10 reps - 3-5 hold - Supine Shoulder Abduction AAROM with Dowel  - 2-3 x daily - 7 x weekly - 10 reps - 3-5 hold - Shoulder Flexion Wall Slide with Towel  - 1 x daily - 7 x weekly - 5 reps - 10-20 hold - Seated Shoulder Abduction Towel Slide at Table Top  - 1 x daily - 7 x weekly - 5 sets - 5 reps - 30 hold - Doorway Pec Stretch at 60 Elevation  - 1 x daily - 7 x weekly - 5 sets - 5 reps - 30 hold - Seated Shoulder External Rotation PROM on Table  - 1 x daily - 7 x weekly - 5 sets - 5 reps - 30 hold - Standing Row with Anchored Resistance  - 1 x daily - 3 x weekly - 2 sets - 10 reps - Shoulder Internal Rotation with Resistance  - 1 x daily - 3 x  weekly - 2 sets - 10 reps - Shoulder External Rotation with Anchored Resistance  - 1 x daily - 3 x weekly - 2 sets - 10 reps - Standing Shoulder Internal Rotation Stretch with Towel  - 1 x daily - 7 x weekly - 3 reps - 15 hold  ASSESSMENT:  CLINICAL IMPRESSION:   Patient responded well to manual therapy with emphasis on joint mobilizations for [facility] joint today.  Shoulder flexion, abd, and IR all improved within tx session after manual tx.  Instructed pt on how to perform self IR HBB stretch to add into HEP.  Overall, making progress with PT.  Patient will benefit from skilled PT interventions to address listed impairments to improve ADL tolerance, pain reduction, and improve pain free R shoulder ROM.   OBJECTIVE IMPAIRMENTS: decreased activity tolerance, decreased ROM, decreased strength, hypomobility, impaired flexibility, impaired UE functional use, and postural dysfunction.   ACTIVITY LIMITATIONS: carrying, lifting, bathing, dressing, reach over head, and hygiene/grooming  PARTICIPATION LIMITATIONS: laundry, shopping, community activity, occupation, and yard work  PERSONAL FACTORS: Past/current experiences, Profession, Time since onset of injury/illness/exacerbation, and 1 comorbidity: breast cancer s/p mastectomy  are also affecting patient's functional outcome.   REHAB POTENTIAL: Good  CLINICAL DECISION MAKING: Stable/uncomplicated  EVALUATION COMPLEXITY: Moderate   GOALS: Goals reviewed with patient? Yes  SHORT TERM GOALS: Target date: 02/28/2023  Patient will be independent in HEP to improve strength/ROM for better functional independence with ADLs. Baseline: 8/26: HEP initiated  Goal status: INITIAL  LONG TERM GOALS: Target date: 03/29/2023  Patient will increase FOTO score to equal to or greater than 69 to demonstrate statistically significant improvement in mobility and quality of life.  Baseline: 8/26: 54 Goal status: INITIAL  2.  Patient will demonstrate adequate R  shoulder ROM and strength to be able to clasp bra, dress independently, and perform overhead activities with pain less than 2/10.  Baseline: 8/26: unable to clasp bra with IR limitation  Goal status: INITIAL  3.  Patient will improve B UE strength by 1/2 MMT grade to improve ability to complete iADLs and job related tasks.  Baseline: 8/26: see above Goal status: INITIAL  4.  Patient will report a worst pain of 2/10 on NRPS in R shoulder to improve tolerance with ADLs and reduced symptoms  with activities.  Baseline: 8/26: worst pain 8/10 Goal status: INITIAL  PLAN:  PT FREQUENCY: 1-2x/week  PT DURATION: 8 weeks  PLANNED INTERVENTIONS: Therapeutic exercises, Therapeutic activity, Patient/Family education, Self Care, Joint mobilization, Joint manipulation, DME instructions, Dry Needling, Spinal mobilization, Cryotherapy, Moist heat, Taping, Traction, and Manual therapy  PLAN FOR NEXT SESSION: HEP review, manual therapy- Soft tissue mob axilla (lats/subscap/pec)/GH joint mob, scapulothoracic mob, continue ROM for shoulder ROM deficits; consider IR strap stretch behind back, adding lateral trunk flexion, scapular retractions   Max Fickle, PT, DPT, OCS  Physical Therapist - [facility] [place]  02/23/2023, 3:20 PM

## 2023-02-25 ENCOUNTER — Inpatient Hospital Stay: Payer: 59 | Attending: Internal Medicine

## 2023-02-25 ENCOUNTER — Inpatient Hospital Stay: Payer: 59

## 2023-02-25 ENCOUNTER — Inpatient Hospital Stay: Payer: 59 | Attending: Internal Medicine | Admitting: Internal Medicine

## 2023-02-25 ENCOUNTER — Encounter: Payer: Self-pay | Admitting: Internal Medicine

## 2023-02-25 VITALS — BP 115/68 | HR 88 | Temp 99.4°F | Wt 218.0 lb

## 2023-02-25 VITALS — BP 114/50 | HR 68 | Resp 18

## 2023-02-25 DIAGNOSIS — Z923 Personal history of irradiation: Secondary | ICD-10-CM | POA: Insufficient documentation

## 2023-02-25 DIAGNOSIS — G629 Polyneuropathy, unspecified: Secondary | ICD-10-CM | POA: Diagnosis not present

## 2023-02-25 DIAGNOSIS — D696 Thrombocytopenia, unspecified: Secondary | ICD-10-CM | POA: Insufficient documentation

## 2023-02-25 DIAGNOSIS — C50411 Malignant neoplasm of upper-outer quadrant of right female breast: Secondary | ICD-10-CM

## 2023-02-25 DIAGNOSIS — Z79899 Other long term (current) drug therapy: Secondary | ICD-10-CM | POA: Insufficient documentation

## 2023-02-25 DIAGNOSIS — Z5112 Encounter for antineoplastic immunotherapy: Secondary | ICD-10-CM | POA: Diagnosis present

## 2023-02-25 DIAGNOSIS — Z79810 Long term (current) use of selective estrogen receptor modulators (SERMs): Secondary | ICD-10-CM | POA: Diagnosis not present

## 2023-02-25 DIAGNOSIS — Z170 Estrogen receptor positive status [ER+]: Secondary | ICD-10-CM | POA: Diagnosis not present

## 2023-02-25 DIAGNOSIS — Z9011 Acquired absence of right breast and nipple: Secondary | ICD-10-CM | POA: Insufficient documentation

## 2023-02-25 DIAGNOSIS — E119 Type 2 diabetes mellitus without complications: Secondary | ICD-10-CM | POA: Insufficient documentation

## 2023-02-25 DIAGNOSIS — Z7984 Long term (current) use of oral hypoglycemic drugs: Secondary | ICD-10-CM | POA: Insufficient documentation

## 2023-02-25 DIAGNOSIS — Z9221 Personal history of antineoplastic chemotherapy: Secondary | ICD-10-CM

## 2023-02-25 DIAGNOSIS — E876 Hypokalemia: Secondary | ICD-10-CM | POA: Insufficient documentation

## 2023-02-25 LAB — COMPREHENSIVE METABOLIC PANEL
ALT: 31 U/L (ref 0–44)
AST: 37 U/L (ref 15–41)
Albumin: 3.3 g/dL — ABNORMAL LOW (ref 3.5–5.0)
Alkaline Phosphatase: 51 U/L (ref 38–126)
Anion gap: 8 (ref 5–15)
BUN: 13 mg/dL (ref 6–20)
CO2: 25 mmol/L (ref 22–32)
Calcium: 8.7 mg/dL — ABNORMAL LOW (ref 8.9–10.3)
Chloride: 105 mmol/L (ref 98–111)
Creatinine, Ser: 0.68 mg/dL (ref 0.44–1.00)
GFR, Estimated: >60 mL/min (ref >60)
Glucose, Bld: 156 mg/dL — ABNORMAL HIGH (ref 70–99)
Potassium: 3.1 mmol/L — ABNORMAL LOW (ref 3.5–5.1)
Sodium: 138 mmol/L (ref 135–145)
Total Bilirubin: 0.5 mg/dL (ref 0.3–1.2)
Total Protein: 7 g/dL (ref 6.5–8.1)

## 2023-02-25 LAB — CBC WITH DIFFERENTIAL/PLATELET
Abs Immature Granulocytes: 0.01 10*3/uL (ref 0.00–0.07)
Basophils Absolute: 0 10*3/uL (ref 0.0–0.1)
Basophils Relative: 1 %
Eosinophils Absolute: 0.1 10*3/uL (ref 0.0–0.5)
Eosinophils Relative: 2 %
HCT: 34.9 % — ABNORMAL LOW (ref 36.0–46.0)
Hemoglobin: 11.5 g/dL — ABNORMAL LOW (ref 12.0–15.0)
Immature Granulocytes: 0 %
Lymphocytes Relative: 25 %
Lymphs Abs: 1.3 10*3/uL (ref 0.7–4.0)
MCH: 28.1 pg (ref 26.0–34.0)
MCHC: 33 g/dL (ref 30.0–36.0)
MCV: 85.3 fL (ref 80.0–100.0)
Monocytes Absolute: 0.4 10*3/uL (ref 0.1–1.0)
Monocytes Relative: 8 %
Neutro Abs: 3.4 10*3/uL (ref 1.7–7.7)
Neutrophils Relative %: 64 %
Platelets: 91 10*3/uL — ABNORMAL LOW (ref 150–400)
RBC: 4.09 MIL/uL (ref 3.87–5.11)
RDW: 15.9 % — ABNORMAL HIGH (ref 11.5–15.5)
WBC: 5.3 10*3/uL (ref 4.0–10.5)
nRBC: 0 % (ref 0.0–0.2)

## 2023-02-25 LAB — VITAMIN D 25 HYDROXY (VIT D DEFICIENCY, FRACTURES): Vit D, 25-Hydroxy: 96.48 ng/mL (ref 30–100)

## 2023-02-25 MED ORDER — ACETAMINOPHEN 325 MG PO TABS
650.0000 mg | ORAL_TABLET | Freq: Once | ORAL | Status: AC
  Administered 2023-02-25: 650 mg via ORAL
  Filled 2023-02-25: qty 2

## 2023-02-25 MED ORDER — SODIUM CHLORIDE 0.9 % IV SOLN
Freq: Once | INTRAVENOUS | Status: AC
  Filled 2023-02-25: qty 250

## 2023-02-25 MED ORDER — PROCHLORPERAZINE MALEATE 10 MG PO TABS
10.0000 mg | ORAL_TABLET | Freq: Once | ORAL | Status: AC
  Administered 2023-02-25: 10 mg via ORAL
  Filled 2023-02-25: qty 1

## 2023-02-25 MED ORDER — SODIUM CHLORIDE 0.9 % IV SOLN
3.6000 mg/kg | Freq: Once | INTRAVENOUS | Status: AC
  Administered 2023-02-25: 360 mg via INTRAVENOUS
  Filled 2023-02-25: qty 8

## 2023-02-25 MED ORDER — DIPHENHYDRAMINE HCL 25 MG PO CAPS
25.0000 mg | ORAL_CAPSULE | Freq: Once | ORAL | Status: AC
  Administered 2023-02-25: 25 mg via ORAL
  Filled 2023-02-25: qty 1

## 2023-02-25 MED ORDER — HEPARIN SOD (PORK) LOCK FLUSH 100 UNIT/ML IV SOLN
500.0000 [IU] | Freq: Once | INTRAVENOUS | Status: AC | PRN
  Administered 2023-02-25: 500 [IU]
  Filled 2023-02-25: qty 5

## 2023-02-25 NOTE — Assessment & Plan Note (Addendum)
#   MAY 2023- STAGE III-T4N1 ER/PR positive HER2 POSITIVE RIGHT breast cancer; LN-positive-ER/PR positive her-2 NEGATIVE; # s/p neoadjuvant chemotherapy- TCH plus P x 6 cycles- s/p Right mastectomy-[15th, NOv 2023] ypT2 [60mm]; ypN0.  Given partial response/involvement of the breast skin/multifocal disease- s/p RT- 08/27/2022. atient currently on tamoxifen [DEC-JAN 2024]; + OFS- Lupron every 3 months ]. ON Ado-trastuzumab emtansine  for 14 cycles.   # proceed with Ado-trastuzumab emtansine #12 of planned 14 cycles.  MUGA- June 13th, 2024- Normal LEFT ventricular ejection fraction -58%   stable. Left mammogram- wnl.  Stable.  # Mild Thrombocytopenia- >91  monitor for now. Stable.   # Allergic reaction to Ado-trastuzumab emtansine-fever/rash- currently on Claritin x3 days postchemotherapy; Tylenol preemptively.  Decreased Benadryl 25 mg given drowsiness. Stable.    # Hypocalcemia-  ON adjuvant Zometa q 6 months x 3 years [first- May-17th, 2024].   Continue ca+Vit D BID. Vit D FEB 2024- 54; [albumin 3.0]-  Stable.  # Right sided swelling/tightness- s/p  Maureen-  Stable.  # Infusion reaction - ZOmeta [flu like symtoms]- imprived with NSAIDs-  Stable.  # Diabetes- BG 148 [Fasting]- recently increased Farxiga 10 mg- [OFF ozempic/metformin- intolerance]-  Stable.  # Hypokalemia: continue Kdur BID- Stable.  # PV access: port functional-   Lupron 02/04/2023 - q 21M; Zometa [over 30 mins] q 6 M- #1 10/22/22   # DISPOSITION: # Echo ASAP # [facility] today- # follow up in 3 week- MD;  port- labs; cbc/cmp- [facility] - Dr.B

## 2023-02-25 NOTE — Progress Notes (Signed)
[facility] CONSULT NOTE  [name], [name], [name], [name]  CHIEF COMPLAINTS/PURPOSE OF CONSULTATION: Breast cancer  Oncology History Overview Note  MAY 2023-    Targeted ultrasound is performed, showing a 2.9 x 1.9 x 2.0 cm irregular hypoechoic mass right breast 10 o'clock position 6 cm from nipple at the site of palpable concern.   There is an abnormal 8 mm thickened right axillary lymph node.   There is skin thickening overlying the right breast. ------------------  A. BREAST, RIGHT AT 10:00, 6 CM FROM THE NIPPLE; ULTRASOUND-GUIDED CORE  NEEDLE BIOPSY:  - INVASIVE MAMMARY CARCINOMA, NO SPECIAL TYPE.   Size of invasive carcinoma: 11 mm in this sample  Histologic grade of invasive carcinoma: Grade 2                       Glandular/tubular differentiation score: 3                       Nuclear pleomorphism score: 2                       Mitotic rate score: 1                       Total score: 6  Ductal carcinoma in situ: Present, intermediate grade  Lymphovascular invasion: Not identified   B. LYMPH NODE, RIGHT AXILLARY; ULTRASOUND-GUIDED CORE NEEDLE BIOPSY:  - 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 IN GREATEST  EXTENT.   Comment:  The malignancy in the primary breast lesion appears morphologically  different from tumor in the lymph node sampling, with tumor present in  lymph node more suggestive of a histologic grade 3, with significantly  increased mitotic activity and pleomorphism. Due to the discrepancy in  these morphologic patterns, ER/PR/HER2 immunohistochemistry will be  performed on both blocks A1 and B1, with reflex to FISH for HER2 2+. The  results will be reported in an addendum.   ADDENDUM:  CASE SUMMARY: BREAST BIOMARKER TESTS - A - BREAST, RIGHT AT 10:00  Estrogen Receptor  (ER) Status: POSITIVE          Percentage of cells with nuclear positivity: 71-80%          Average intensity of staining: Strong   Progesterone Receptor (PgR) Status: POSITIVE          Percentage of cells with nuclear positivity: 81-90%          Average intensity of staining: Strong   HER2 (by immunohistochemistry): POSITIVE (Score 3+)       Percentage of cells with uniform intense complete membrane  staining: 61-70%  HER2 displays a heterogeneous staining pattern, with areas showing a  strong 3+ staining pattern, and other regions with complete absence (0+)  of staining.   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  Discussed with Dr. [name] tumor heterogeneity. BREAST MRI- JUNE 2023- The patient's known malignancy in the upper outer quadrant of the right breast measures 3.8 x 2.8 x 3.6 cm. Numerous other suspicious masses are identified in the right breast as above involving the upper outer and upper inner quadrants, located both anterior and posterior to the known malignancy.  Numerous enhancing foci in the skin as above are worrisome for skin metastases ; Bx proven. Biopsy proven metastatic node in the right axilla.  No evidence of malignancy in the left breast;  Two mildly prominent right internal mammary nodes were not FDG avid on recent PET-CT imaging.  Currently on neoadjuvant chemotherapy- TCH  plus P x6 cycles;  MUGA scan May 31st, 2023- -61%.    # June 7th, 2023- NEO-ADJUVANT CHEMO- TCH+P x6 cycles- s/p Mastectomy [NOV 15th, 2023] [Dr.Byrnett]-  ypT2 [35mm]; ypN0.  # JAN mid, 2024 -TAMOXFEN 20 mg a day; ADD LupronFEB 2nd.2 2024 s/p RT maech, 22nd- 2024  # FEB 2nd, 2023- Kadcyla q 3 W x14 cycles;   # Lupron q3 M [/start 2/07-2022]   # Genetic counseling s/p- NEG for any deleterious mutations.     Carcinoma of upper-outer quadrant of right breast in female, estrogen receptor positive (HCC)  10/26/2021 Initial Diagnosis   Carcinoma of upper-outer quadrant of right breast in female, estrogen receptor positive (HCC)   10/26/2021 Cancer Staging   Staging form: Breast, AJCC 8th Edition - Clinical: Stage IIIA (cT4d, cN1, cM0, G2, ER+, PR+, HER2+) - Signed by [name], MD on 06/04/2022 Histologic grading system: 3 grade system   11/10/2021 - 03/01/2022 Chemotherapy   Patient is on Treatment Plan : BREAST  Docetaxel + Carboplatin + Trastuzumab + Pertuzumab  (TCHP) q21d      11/11/2021 - 01/13/2022 Chemotherapy   Patient is on Treatment Plan : BREAST  Docetaxel + Carboplatin + Trastuzumab + Pertuzumab  (TCHP) q21d       Genetic Testing   Negative genetic testing. No pathogenic variants identified on the [facility] CancerNext-Expanded+RNA panel. The report date is 11/29/2021.  The CancerNext-Expanded + RNAinsight gene panel offered by [employer] and includes sequencing and rearrangement analysis for the following 77 genes: IP, ALK, APC*, ATM*, AXIN2, BAP1, BARD1, BLM, BMPR1A, BRCA1*, BRCA2*, BRIP1*, CDC73, CDH1*,CDK4, CDKN1B, CDKN2A, CHEK2*, CTNNA1, DICER1, FANCC, FH, FLCN, GALNT12, KIF1B, LZTR1, MAX, MEN1, MET, MLH1*, MSH2*, MSH3, MSH6*, MUTYH*, NBN, NF1*, NF2, NTHL1, PALB2*, PHOX2B, PMS2*, POT1, PRKAR1A, PTCH1, PTEN*, RAD51C*, RAD51D*,RB1, RECQL, RET, SDHA, SDHAF2, SDHB, SDHC, SDHD, SMAD4, SMARCA4, SMARCB1, SMARCE1, STK11, SUFU, TMEM127, TP53*,TSC1, TSC2, VHL and XRCC2 (sequencing  and deletion/duplication); EGFR, EGLN1, HOXB13, KIT, MITF, PDGFRA, POLD1 and POLE (sequencing only); EPCAM and GREM1 (deletion/duplication only).   07/09/2022 -  Chemotherapy   Patient is on Treatment Plan : BREAST ADO-Trastuzumab Emtansine (Kadcyla) q21d      HISTORY OF PRESENTING ILLNESS: Patient is ambulating independently.  Alone.   Marilyn Page 47 y.o.  female right breast TRIPLE POSITIVE with T4- Stage III -s/p mastectomy-with expanders-currently on adjuvant kadcyla / tamoxifen + OFS  is here for follow-up.    Patient states that she is doing well, and she doesn't;t have any new questions or concerns today.   Continues to have mild tightness of right chest  wall. Started on PT with [facility]. Patient states she is compliant with therapy oral Potassium.  Mild  neuropathy.   Review of Systems  Constitutional:  Positive for malaise/fatigue. Negative for chills, diaphoresis, fever and weight loss.  HENT:  Negative for nosebleeds and sore throat.   Eyes:  Negative for double vision.  Respiratory:  Negative for cough, hemoptysis, sputum production, shortness of breath and wheezing.   Cardiovascular:  Negative for chest pain, palpitations, orthopnea and leg swelling.  Gastrointestinal:  Negative for abdominal pain, blood in stool, constipation, diarrhea, heartburn, melena, nausea and vomiting.  Genitourinary:  Negative for dysuria, frequency and urgency.  Musculoskeletal:  Positive for myalgias. Negative for back pain and joint pain.  Skin: Negative.  Negative for itching and rash.  Neurological:  Negative for dizziness, tingling, focal weakness, weakness and headaches.  Endo/Heme/Allergies:  Does not bruise/bleed easily.  Psychiatric/Behavioral:  Negative for depression. The patient is not nervous/anxious and does not have insomnia.      MEDICAL HISTORY:  Past Medical History:  Diagnosis Date   Anemia    Carcinoma of upper-outer quadrant of right breast in female, estrogen receptor  positive (HCC) 10/26/2021   Diabetes mellitus without complication (HCC)    Family history of adverse reaction to anesthesia    grandmother had issues but doesnt know what the issue was, she was older.   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 Axilla Butterfly hydro clip-MACRO METASTATIC MAMMARY CARCINOMA   BREAST BIOPSY Right 04/21/2022   [place] RT PLC BREAST LOC DEV   1ST LESION  INC US GUIDE 04/21/2022 [facility]   BREAST RECONSTRUCTION WITH PLACEMENT OF TISSUE EXPANDER AND FLEX HD (ACELLULAR HYDRATED DERMIS) Right 04/21/2022   Procedure: RIGHT BREAST RECONSTRUCTION WITH PLACEMENT OF TISSUE EXPANDER AND FLEX HD (ACELLULAR HYDRATED DERMIS);  Surgeon: Peggye Form, DO;  Location: [facility] ORS;  Service: Plastics;  Laterality: Right;   CESAREAN SECTION  2005/2008   DILATION AND CURETTAGE OF UTERUS  2003   PORTACATH PLACEMENT Left 11/09/2021   Procedure: INSERTION PORT-A-CATH;  Surgeon: Earline Mayotte, MD;  Location: [facility] ORS;  Service: General;  Laterality: Left;   REMOVAL OF TISSUE EXPANDER AND PLACEMENT OF IMPLANT Right 06/08/2022   Procedure: REMOVAL OF TISSUE EXPANDER;  Surgeon: Peggye Form, DO;  Location: [facility] OR;  Service: Plastics;  Laterality: Right;   SIMPLE MASTECTOMY WITH AXILLARY SENTINEL NODE BIOPSY Right 04/21/2022   Procedure: SIMPLE MASTECTOMY WITH AXILLARY SENTINEL NODE BIOPSY;  Surgeon: Earline Mayotte, MD;  Location: [facility] ORS;  Service: General;  Laterality: Right;  wire localization of lymph node   SKIN BIOPSY Right 11/09/2021   Procedure: PUNCH BIOPSY SKIN, TATTOO LYMPH NODE RIGHT AXILLA;  Surgeon:  Earline Mayotte, MD;  Location: [facility] ORS;  Service: General;  Laterality: Right;   WISDOM TOOTH EXTRACTION      SOCIAL HISTORY: Social History   Socioeconomic History   Marital status: Married    Spouse name: Fraser Din   Number of children: 2   Years of education: Not on file   Highest education level: Not on file  Occupational History   Occupation:  and teacher's assistant  Tobacco Use   Smoking status: Never   Smokeless tobacco: Never  Vaping Use   Vaping status: Never Used  Substance and Sexual Activity   Alcohol use: Never   Drug use: Never   Sexual activity: Yes    Birth control/protection: Other-see comments, Surgical    Comment: vasectomy  Other Topics Concern   Not on file  Social History Narrative   Pre-school teacher; in [place]; 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    Social Determinants of Health   Financial Resource Strain: Not on file  Food Insecurity: Not on file  Transportation Needs: Not on file  Physical Activity: Inactive (09/26/2017)   Exercise Vital Sign    Days of Exercise per Week: 0 days    Minutes of Exercise per Session: 0 min  Stress: Not on file  Social Connections: Not on file  Intimate Partner Violence: Not on file    FAMILY HISTORY: Family History  Problem Relation Age of Onset   Ovarian cysts Mother    Migraines Mother    Melanoma Mother        on leg   Hyperlipidemia Father    Diabetes Maternal Aunt    Stomach cancer Paternal Aunt    Diabetes Maternal Grandfather    Lymphoma Cousin 17   Breast cancer Neg Hx     ALLERGIES:  is allergic to metformin and oxycodone.  MEDICATIONS:  Current Outpatient Medications  Medication Sig Dispense Refill   acetaminophen (TYLENOL) 325 MG tablet Take 2 tablets (650 mg total) by mouth every 6 (six) hours as needed for mild pain (or Fever >/= 101).     ascorbic acid (VITAMIN C) 500 MG tablet Take 1 tablet (500 mg total) by mouth daily. 30 tablet    calcium-vitamin D (OSCAL WITH D) 500-5 MG-MCG tablet  Take 1 tablet by mouth 2 (two) times daily. 60 tablet 2   Cyanocobalamin (VITAMIN B-12 PO) Take 1 tablet by mouth daily.     Dapagliflozin Propanediol (FARXIGA PO) Take 10 mg by mouth daily.     ferrous sulfate 325 (65 FE) MG tablet Take 325 mg by mouth 2 (two) times daily with a meal.     lidocaine-prilocaine (EMLA) cream Apply on the port. 30 -45 min  prior to port access. 30 g 3   loratadine (CLARITIN) 10 MG tablet Take 10 mg by mouth as needed for allergies. Day before tx, day of and day after.     Magnesium Cl-Calcium Carbonate (SLOW MAGNESIUM/CALCIUM) 70-117 MG TBEC Take 1 tablet by mouth 2 (two) times daily. 60 tablet 6   Multiple Vitamin (MULTIVITAMIN WITH MINERALS) TABS tablet Take 1 tablet by mouth daily.     ondansetron (ZOFRAN) 4 MG tablet Take 1 tablet (4 mg total) by mouth every 8 (eight) hours as needed for up to 20 doses for nausea or vomiting. 20 tablet 0   ondansetron (ZOFRAN-ODT) 8 MG disintegrating tablet Take 8 mg by mouth every 8 (eight) hours as needed for nausea or vomiting.     ONETOUCH VERIO test strip SMARTSIG:1 Each Via Meter Daily PRN     potassium chloride (KLOR-CON) 20 MEQ packet Take 20 mEq by mouth 2 (two) times daily. 60 packet 3   prochlorperazine (COMPAZINE) 10 MG tablet Take 1 tablet (10 mg total) by mouth every 6 (six) hours as needed for nausea or vomiting. 40 tablet 1   tamoxifen (NOLVADEX) 20 MG tablet TAKE 1 TABLET BY MOUTH EVERY DAY 90 tablet 1  No current facility-administered medications for this visit.      Marland Kitchen  PHYSICAL EXAMINATION: ECOG PERFORMANCE STATUS: 0 - Asymptomatic  Vitals:   02/25/23 1316  BP: 115/68  Pulse: 88  Temp: 99.4 F (37.4 C)  SpO2: 100%         Filed Weights   02/25/23 1316  Weight: 218 lb (98.9 kg)    Physical Exam Vitals and nursing note reviewed.  HENT:     Head: Normocephalic and atraumatic.     Mouth/Throat:     Pharynx: Oropharynx is clear.  Eyes:     Extraocular Movements: Extraocular movements  intact.     Pupils: Pupils are equal, round, and reactive to light.  Cardiovascular:     Rate and Rhythm: Normal rate and regular rhythm.  Pulmonary:     Comments: Decreased breath sounds bilaterally.  Abdominal:     Palpations: Abdomen is soft.  Musculoskeletal:        General: Normal range of motion.     Cervical back: Normal range of motion.  Skin:    General: Skin is warm.  Neurological:     General: No focal deficit present.     Mental Status: She is alert and oriented to person, place, and time.  Psychiatric:        Behavior: Behavior normal.        Judgment: Judgment normal.      LABORATORY DATA:  I have reviewed the data as listed Lab Results  Component Value Date   WBC 5.3 02/25/2023   HGB 11.5 (L) 02/25/2023   HCT 34.9 (L) 02/25/2023   MCV 85.3 02/25/2023   PLT 91 (L) 02/25/2023   Recent Labs    01/14/23 0917 02/04/23 0844 02/25/23 1306  NA 137 138 138  K 3.4* 3.1* 3.1*  CL 106 106 105  CO2 25 26 25   GLUCOSE 159* 148* 156*  BUN 12 11 13   CREATININE 0.60 0.46 0.68  CALCIUM 8.6* 8.8* 8.7*  GFRNONAA >60 >60 >60  PROT 7.1 6.8 7.0  ALBUMIN 3.2* 3.1* 3.3*  AST 39 34 37  ALT 34 27 31  ALKPHOS 47 47 51  BILITOT 0.6 0.4 0.5    RADIOGRAPHIC STUDIES: I have personally reviewed the radiological images as listed and agreed with the findings in the report. No results found.  ASSESSMENT & PLAN:   Carcinoma of upper-outer quadrant of right breast in female, estrogen receptor positive (HCC) # MAY 2023- STAGE III-T4N1 ER/PR positive HER2 POSITIVE RIGHT breast cancer; LN-positive-ER/PR positive her-2 NEGATIVE; # s/p neoadjuvant chemotherapy- TCH plus P x 6 cycles- s/p Right mastectomy-[15th, NOv 2023] ypT2 [74mm]; ypN0.  Given partial response/involvement of the breast skin/multifocal disease- s/p RT- 08/27/2022. atient currently on tamoxifen [DEC-JAN 2024]; + OFS- Lupron every 3 months ]. ON Ado-trastuzumab emtansine  for 14 cycles.   # proceed with  Ado-trastuzumab emtansine #12 of planned 14 cycles.  MUGA- June 13th, 2024- Normal LEFT ventricular ejection fraction -58%   stable. Left mammogram- wnl.  Stable.  # Mild Thrombocytopenia- >91  monitor for now. Stable.   # Allergic reaction to Ado-trastuzumab emtansine-fever/rash- currently on Claritin x3 days postchemotherapy; Tylenol preemptively.  Decreased Benadryl 25 mg given drowsiness. Stable.    # Hypocalcemia-  ON adjuvant Zometa q 6 months x 3 years [first- May-17th, 2024].   Continue ca+Vit D BID. Vit D FEB 2024- 54; [albumin 3.0]-  Stable.  # Right sided swelling/tightness- s/p  Maureen-  Stable.  # Infusion reaction - ZOmeta [  flu like symtoms]- imprived with NSAIDs-  Stable.  # Diabetes- BG 148 [Fasting]- recently increased Farxiga 10 mg- [OFF ozempic/metformin- intolerance]-  Stable.  # Hypokalemia: continue Kdur BID- Stable.  # PV access: port functional-   Lupron 02/04/2023 - q 59M; Zometa [over 30 mins] q 6 M- #1 10/22/22   # DISPOSITION: # Echo ASAP # Kadcyla today- # follow up in 3 week- MD;  port- labs; cbc/cmp- Kadcyla - Dr.B    All questions were answered. The patient/family knows to call the clinic with any problems, questions or concerns.     [name], MD 02/25/2023 2:01 PM

## 2023-02-25 NOTE — Progress Notes (Signed)
Patient has no new questions or concerns for the doctor today.

## 2023-02-28 ENCOUNTER — Ambulatory Visit: Payer: 59

## 2023-02-28 DIAGNOSIS — M25611 Stiffness of right shoulder, not elsewhere classified: Secondary | ICD-10-CM | POA: Diagnosis not present

## 2023-02-28 DIAGNOSIS — M6281 Muscle weakness (generalized): Secondary | ICD-10-CM

## 2023-02-28 NOTE — Therapy (Signed)
OUTPATIENT PHYSICAL THERAPY SHOULDER TREATMENT   Patient Name: Marilyn Page MRN: [id number] DOB:[birth year], 47 y.o., female Today's Date: 02/28/2023  END OF SESSION:  PT End of Session - 02/28/23 1624     Visit Number 6    Number of Visits 17    Date for PT Re-Evaluation 03/28/23    PT Start Time 1545    PT Stop Time 1630    PT Time Calculation (min) 45 min    Activity Tolerance Patient tolerated treatment well    Behavior During Therapy [facility] for tasks assessed/performed              Past Medical History:  Diagnosis Date   Anemia    Carcinoma of upper-outer quadrant of right breast in female, estrogen receptor positive (HCC) 10/26/2021   Diabetes mellitus without complication (HCC)    Family history of adverse reaction to anesthesia    grandmother had issues but doesnt know what the issue was, she was older.   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 Axilla Butterfly hydro clip-MACRO METASTATIC MAMMARY CARCINOMA   BREAST BIOPSY Right 04/21/2022   [place] RT PLC BREAST LOC DEV   1ST LESION  INC US GUIDE 04/21/2022 [facility]   BREAST RECONSTRUCTION WITH PLACEMENT OF TISSUE EXPANDER AND FLEX HD (ACELLULAR HYDRATED DERMIS) Right 04/21/2022   Procedure: RIGHT BREAST RECONSTRUCTION WITH PLACEMENT OF TISSUE EXPANDER AND FLEX HD (ACELLULAR HYDRATED DERMIS);  Surgeon: Peggye Form, DO;  Location: [facility] ORS;  Service: Plastics;  Laterality: Right;   CESAREAN SECTION  2005/2008   DILATION AND CURETTAGE OF UTERUS  2003   PORTACATH PLACEMENT Left 11/09/2021   Procedure: INSERTION PORT-A-CATH;  Surgeon: Earline Mayotte, MD;  Location:  [facility];  Service: General;  Laterality: Left;   REMOVAL OF TISSUE EXPANDER AND PLACEMENT OF IMPLANT Right 06/08/2022   Procedure: REMOVAL OF TISSUE EXPANDER;  Surgeon: Peggye Form, DO;  Location: [facility];  Service: Plastics;  Laterality: Right;   SIMPLE MASTECTOMY WITH AXILLARY SENTINEL NODE BIOPSY Right 04/21/2022   Procedure: SIMPLE MASTECTOMY WITH AXILLARY SENTINEL NODE BIOPSY;  Surgeon: Earline Mayotte, MD;  Location: [facility] ORS;  Service: General;  Laterality: Right;  wire localization of lymph node   SKIN BIOPSY Right 11/09/2021   Procedure: PUNCH BIOPSY SKIN, TATTOO LYMPH NODE RIGHT AXILLA;  Surgeon: Earline Mayotte, MD;  Location: [facility] ORS;  Service: General;  Laterality: Right;   WISDOM TOOTH EXTRACTION     Patient Active Problem List   Diagnosis Date Noted   Acquired absence of right breast 06/18/2022   Low vitamin B12 level 05/24/2022   Hypomagnesemia 05/16/2022   Lactic acid acidosis 03/06/2022   Fever and chills 03/05/2022   COVID-19 virus infection 12/28/2021   Thrombocytopenia (HCC) 12/28/2021   Diabetes mellitus (HCC) 12/28/2021   Febrile neutropenia (HCC) 12/27/2021   Sepsis (HCC) 12/27/2021   Genetic testing 12/01/2021   Carcinoma of upper-outer quadrant of right breast in female, estrogen receptor positive (HCC) 10/26/2021   Uterus, adenomyosis 05/14/2021   ASCUS of cervix with negative high risk HPV 04/23/2021   Iron deficiency anemia due to chronic blood loss  04/23/2021   Type 2 diabetes mellitus without complication, without long-term current use of insulin (HCC) 05/18/2020   RLS (restless legs syndrome) 05/14/2020   Menorrhagia with regular cycle 10/14/2017   BMI 37.0-37.9, adult 10/03/2017   History of gestational diabetes 10/03/2017    PCP: [name], NP   REFERRING PROVIDER: [name], MD   REFERRING DIAG:  [phone] (ICD-10-CM) - Stiffness of right shoulder, not elsewhere classified  C50.411,Z17.0 (ICD-10-CM) - Carcinoma  of upper-outer quadrant of right breast in female, estrogen receptor positive    THERAPY DIAG:  Stiffness of right shoulder, not elsewhere classified  Muscle weakness (generalized)  Rationale for Evaluation and Treatment: Rehabilitation  ONSET DATE: 04/2022   SUBJECTIVE:     From Initial evaluation note 01/31/23                                                                                                                                                                                  SUBJECTIVE STATEMENT: Patient has been seeing [name], OT for lymphedema. Patient reports pain and "pulling" on posterior aspect of upper arm. Patient reports difficulty sleeping, reaching overhead, getting dressed (clasping bra), lifting heavy objects.  Patient has pulley system and red theraband. Has been completing stretches 2x/week and pulley/theraband 2-3x/week.  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 Started 6 weeks of radiation 07/2022 and currently in chemotherapy.   Patient referred to PT for R shoulder pain s/p R mastectomy/removal of expanders and radiation. Patient has seen OT for lymphedema and R shoulder ROM ~1 month for maintenance. OT has provided patient with stretches focused on pec and lat muscle tightness (shoulder abduction and flexion in supine with rotation of hips to the L), child's pose, and AAROM shoulder flexion/abduction on the table.    PAIN:  Are you having pain? Yes: NPRS scale: 2/10 - at worst 8/10 Pain location: R shoulder  Pain description: sharp, achy Aggravating factors: reaching, quick movements with arm  Relieving factors: Ice, massage ball   PRECAUTIONS: None  RED FLAGS: None   WEIGHT BEARING RESTRICTIONS: No  FALLS:  Has patient fallen in last 6 months? 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[place]  PLOF: Independent  PATIENT GOALS:to get this to not hurt   NEXT MD VISIT: 02/04/23 - chemo  OBJECTIVE:  DIAGNOSTIC FINDINGS:  N/A  PATIENT SURVEYS:  FOTO 17 with goal of 50  COGNITION: Overall cognitive status: Within functional limits for tasks assessed     SENSATION: WFL  POSTURE: Rounded shoulders, forward head   UPPER EXTREMITY ROM:   Active ROM Right eval Left eval  Shoulder flexion 120 WFL   Shoulder extension    Shoulder abduction 87 WFL   Shoulder adduction    Shoulder internal rotation 35 WFL  Shoulder external rotation 30 WFL   (Blank rows = not tested)  UPPER EXTREMITY MMT:  MMT Right eval Left eval  Shoulder flexion 4+ 4+  Shoulder extension    Shoulder abduction 4* 4+  Shoulder adduction    Shoulder internal rotation 4* 4+  Shoulder external rotation 4* 4+  Middle trapezius    Lower trapezius    Elbow flexion 4+ 5  Elbow extension 4+ 5  (Blank rows = not tested)  JOINT MOBILITY TESTING:  Joint stiffness noted with AP/PA and inferior mobilization   PALPATION:  Significant trigger points and taut muscle bands at pec minor, R UT, and periscapular region.    TODAY'S TREATMENT:                                                                                                                                         DATE: 02/28/23 Subjective: Pt had her chemotherapy tx on Friday; she reports feeling fatigued after this; this is typical for her.  She was able work on her HEP some.  She is sore upon arrival after one of her students pulled on her arm today.    Pain 2-3/10 R shoulder at rest  Objective: ROM pre manual tx: abd 90, HBB thumb to L5  Manual Therapy: 20 min Jt mobilizations: R GH distraction, A/P  and inf glides Gr III/IV in various positions of flexion, abd (pt in supine hooklying position), seated distraction with IR HBB GR III/IV  PROM flexion, abd, ER, IR ROM measurements post-manual tx: flexion 140 deg, abd 105 deg, HBB thumb to L3  Therapeutic Exercise: 15 min AAROM flexion, abd with dowel x 10 each- not today Towel IR hand behind back stretch 3x 20 seconds with strap Scapular retractions in standing: 2x10 Scapular retraction + rows: 2x10 RTB Seated thoracic extension x10 over chair  Ended session with 10 min cold pack to shoulder (pain 3/10 at end of session) as pt reports feeling more sore upon arrival and also after tx today  Not today: Supine R shoulder flexion, abduction 2# DB 2 x 10   Wall slide flexion with bolster: x10 Doorway pec stretch: 30 seconds x 4 Standing row BTB 2 x 15 Standing IR with towel roll at elbow BTB 2 x 15 Standing ER with towel roll at elbow RTB 2 x 15   PATIENT EDUCATION: Education details: HEP, POC, goals  Person educated: Patient Education method: Explanation, Demonstration, and  Handouts Education comprehension: verbalized understanding  HOME EXERCISE PROGRAM: Access Code: [email or web address] URL: [email or web address] Date: 02/23/2023 Prepared by: Max Fickle  Exercises - Pec Minor Stretch  - 2-3 x daily - 7 x weekly - 5-10 reps - 30 hold - Corner Pec Major Stretch  - 2-3 x daily - 7 x weekly - 5-10 reps - 30 hold - Supine Shoulder Flexion with Dowel  - 2-3 x daily - 7 x weekly - 10 reps - 3-5 hold - Supine Shoulder Abduction AAROM with Dowel  - 2-3 x daily - 7 x weekly - 10 reps - 3-5 hold - Shoulder Flexion Wall Slide with Towel  - 1 x daily - 7 x weekly - 5 reps - 10-20 hold - Seated Shoulder Abduction Towel Slide at Table Top  - 1 x daily - 7 x weekly - 5 sets - 5 reps - 30 hold - Doorway Pec Stretch at 60 Elevation  - 1 x daily - 7 x weekly - 5 sets - 5 reps - 30 hold - Seated Shoulder External Rotation PROM on Table   - 1 x daily - 7 x weekly - 5 sets - 5 reps - 30 hold - Standing Row with Anchored Resistance  - 1 x daily - 3 x weekly - 2 sets - 10 reps - Shoulder Internal Rotation with Resistance  - 1 x daily - 3 x weekly - 2 sets - 10 reps - Shoulder External Rotation with Anchored Resistance  - 1 x daily - 3 x weekly - 2 sets - 10 reps - Standing Shoulder Internal Rotation Stretch with Towel  - 1 x daily - 7 x weekly - 3 reps - 15 hold  ASSESSMENT:  CLINICAL IMPRESSION:   Patient responded well to manual therapy with emphasis on joint mobilizations for [facility] joint today.  Shoulder flexion, abd, and IR all improved within tx session after manual tx.  Did not progress therapeutic exercises as pt experiencing post-chemotherapy fatigue, and opted to end with cold pack for pain control instead to address increased soreness she reports upon arrival that remained during session.  Overall, making progress with PT.  Patient will benefit from skilled PT interventions to address listed impairments to improve ADL tolerance, pain reduction, and improve pain free R shoulder ROM.   OBJECTIVE IMPAIRMENTS: decreased activity tolerance, decreased ROM, decreased strength, hypomobility, impaired flexibility, impaired UE functional use, and postural dysfunction.   ACTIVITY LIMITATIONS: carrying, lifting, bathing, dressing, reach over head, and hygiene/grooming  PARTICIPATION LIMITATIONS: laundry, shopping, community activity, occupation, and yard work  PERSONAL FACTORS: Past/current experiences, Profession, Time since onset of injury/illness/exacerbation, and 1 comorbidity: breast cancer s/p mastectomy  are also affecting patient's functional outcome.   REHAB POTENTIAL: Good  CLINICAL DECISION MAKING: Stable/uncomplicated  EVALUATION COMPLEXITY: Moderate   GOALS: Goals reviewed with patient? Yes  SHORT TERM GOALS: Target date: 02/28/2023  Patient will be independent in HEP to improve strength/ROM for better functional  independence with ADLs. Baseline: 8/26: HEP initiated  Goal status: INITIAL  LONG TERM GOALS: Target date: 03/29/2023  Patient will increase FOTO score to equal to or greater than 69 to demonstrate statistically significant improvement in mobility and quality of life.  Baseline: 8/26: 54 Goal status: INITIAL  2.  Patient will demonstrate adequate R shoulder ROM and strength to be able to clasp bra, dress independently, and perform overhead activities with pain less than 2/10.  Baseline: 8/26: unable to clasp bra with IR limitation  Goal status: INITIAL  3.  Patient will improve B UE strength by 1/2 MMT grade to improve ability to complete iADLs and job related tasks.  Baseline: 8/26: see above Goal status: INITIAL  4.  Patient will report a worst pain of 2/10 on NRPS in R shoulder to improve tolerance with ADLs and reduced symptoms with activities.  Baseline: 8/26: worst pain 8/10 Goal status: INITIAL  PLAN:  PT FREQUENCY: 1-2x/week  PT DURATION: 8 weeks  PLANNED INTERVENTIONS: Therapeutic exercises, Therapeutic activity, Patient/Family education, Self Care, Joint mobilization, Joint manipulation, DME instructions, Dry Needling, Spinal mobilization, Cryotherapy, Moist heat, Taping, Traction, and Manual therapy  PLAN FOR NEXT SESSION: HEP review, manual therapy- Soft tissue mob axilla (lats/subscap/pec)/GH joint mob, scapulothoracic mob, continue ROM for shoulder ROM deficits; consider IR strap stretch behind back, adding lateral trunk flexion, scapular retractions   Max Fickle, PT, DPT, OCS  Physical Therapist - [facility]  [facility]  02/28/2023, 4:25 PM

## 2023-03-02 ENCOUNTER — Other Ambulatory Visit: Payer: Self-pay | Admitting: Internal Medicine

## 2023-03-03 ENCOUNTER — Ambulatory Visit: Payer: 59

## 2023-03-03 DIAGNOSIS — M25611 Stiffness of right shoulder, not elsewhere classified: Secondary | ICD-10-CM | POA: Diagnosis not present

## 2023-03-03 DIAGNOSIS — M6281 Muscle weakness (generalized): Secondary | ICD-10-CM

## 2023-03-03 NOTE — Therapy (Signed)
OUTPATIENT PHYSICAL THERAPY SHOULDER TREATMENT   Patient Name: Marilyn Page MRN: [id number] DOB:01-03-1976, 47 y.o., female Today's Date: 03/03/2023  END OF SESSION:  PT End of Session - 03/03/23 1732     Visit Number 7    Number of Visits 17    Date for PT Re-Evaluation 03/28/23    PT Start Time 1545    PT Stop Time 1630    PT Time Calculation (min) 45 min    Activity Tolerance Patient tolerated treatment well    Behavior During Therapy [facility] for tasks assessed/performed              Past Medical History:  Diagnosis Date   Anemia    Carcinoma of upper-outer quadrant of right breast in female, estrogen receptor positive (HCC) 10/26/2021   Diabetes mellitus without complication (HCC)    Family history of adverse reaction to anesthesia    grandmother had issues but doesnt know what the issue was, she was older.   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 Axilla Butterfly hydro clip-MACRO METASTATIC MAMMARY CARCINOMA   BREAST BIOPSY Right 04/21/2022   [place] RT PLC BREAST LOC DEV   1ST LESION  INC US GUIDE 04/21/2022 [facility]   BREAST RECONSTRUCTION WITH PLACEMENT OF TISSUE EXPANDER AND FLEX HD (ACELLULAR HYDRATED DERMIS) Right 04/21/2022   Procedure: RIGHT BREAST RECONSTRUCTION WITH PLACEMENT OF TISSUE EXPANDER AND FLEX HD (ACELLULAR HYDRATED DERMIS);  Surgeon: Peggye Form, DO;  Location: [facility];  Service: Plastics;  Laterality: Right;   CESAREAN SECTION  2005/2008   DILATION AND CURETTAGE OF UTERUS  2003   PORTACATH PLACEMENT Left 11/09/2021   Procedure: INSERTION PORT-A-CATH;  Surgeon: Earline Mayotte, MD;  Location:  [facility];  Service: General;  Laterality: Left;   REMOVAL OF TISSUE EXPANDER AND PLACEMENT OF IMPLANT Right 06/08/2022   Procedure: REMOVAL OF TISSUE EXPANDER;  Surgeon: Peggye Form, DO;  Location: [facility];  Service: Plastics;  Laterality: Right;   SIMPLE MASTECTOMY WITH AXILLARY SENTINEL NODE BIOPSY Right 04/21/2022   Procedure: SIMPLE MASTECTOMY WITH AXILLARY SENTINEL NODE BIOPSY;  Surgeon: Earline Mayotte, MD;  Location: [facility];  Service: General;  Laterality: Right;  wire localization of lymph node   SKIN BIOPSY Right 11/09/2021   Procedure: PUNCH BIOPSY SKIN, TATTOO LYMPH NODE RIGHT AXILLA;  Surgeon: Earline Mayotte, MD;  Location: [facility];  Service: General;  Laterality: Right;   WISDOM TOOTH EXTRACTION     Patient Active Problem List   Diagnosis Date Noted   Acquired absence of right breast 06/18/2022   Low vitamin B12 level 05/24/2022   Hypomagnesemia 05/16/2022   Lactic acid acidosis 03/06/2022   Fever and chills 03/05/2022   COVID-19 virus infection 12/28/2021   Thrombocytopenia (HCC) 12/28/2021   Diabetes mellitus (HCC) 12/28/2021   Febrile neutropenia (HCC) 12/27/2021   Sepsis (HCC) 12/27/2021   Genetic testing 12/01/2021   Carcinoma of upper-outer quadrant of right breast in female, estrogen receptor positive (HCC) 10/26/2021   Uterus, adenomyosis 05/14/2021   ASCUS of cervix with negative high risk HPV 04/23/2021   Iron deficiency anemia due to chronic blood loss  04/23/2021   Type 2 diabetes mellitus without complication, without long-term current use of insulin (HCC) 05/18/2020   RLS (restless legs syndrome) 05/14/2020   Menorrhagia with regular cycle 10/14/2017   BMI 37.0-37.9, adult 10/03/2017   History of gestational diabetes 10/03/2017    PCP: [name], NP   REFERRING PROVIDER: [name], MD   REFERRING DIAG:  [phone] (ICD-10-CM) - Stiffness of right shoulder, not elsewhere classified  C50.411,Z17.0 (ICD-10-CM) - Carcinoma  of upper-outer quadrant of right breast in female, estrogen receptor positive    THERAPY DIAG:  Stiffness of right shoulder, not elsewhere classified  Muscle weakness (generalized)  Rationale for Evaluation and Treatment: Rehabilitation  ONSET DATE: 04/2022   SUBJECTIVE:     From Initial evaluation note 01/31/23                                                                                                                                                                                  SUBJECTIVE STATEMENT: Patient has been seeing [name], OT for lymphedema. Patient reports pain and "pulling" on posterior aspect of upper arm. Patient reports difficulty sleeping, reaching overhead, getting dressed (clasping bra), lifting heavy objects.  Patient has pulley system and red theraband. Has been completing stretches 2x/week and pulley/theraband 2-3x/week.  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 Started 6 weeks of radiation 07/2022 and currently in chemotherapy.   Patient referred to PT for R shoulder pain s/p R mastectomy/removal of expanders and radiation. Patient has seen OT for lymphedema and R shoulder ROM ~1 month for maintenance. OT has provided patient with stretches focused on pec and lat muscle tightness (shoulder abduction and flexion in supine with rotation of hips to the L), child's pose, and AAROM shoulder flexion/abduction on the table.    PAIN:  Are you having pain? Yes: NPRS scale: 2/10 - at worst 8/10 Pain location: R shoulder  Pain description: sharp, achy Aggravating factors: reaching, quick movements with arm  Relieving factors: Ice, massage ball   PRECAUTIONS: None  RED FLAGS: None   WEIGHT BEARING RESTRICTIONS: No  FALLS:  Has patient fallen in last 6 months? 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[place]  PLOF: Independent  PATIENT GOALS:to get this to not hurt   NEXT MD VISIT: 02/04/23 - chemo  OBJECTIVE:  DIAGNOSTIC FINDINGS:  N/A  PATIENT SURVEYS:  FOTO 36 with goal of 9  COGNITION: Overall cognitive status: Within functional limits for tasks assessed     SENSATION: WFL  POSTURE: Rounded shoulders, forward head   UPPER EXTREMITY ROM:   Active ROM Right eval Left eval  Shoulder flexion 120 WFL   Shoulder extension    Shoulder abduction 87 WFL   Shoulder adduction    Shoulder internal rotation 35 WFL  Shoulder external rotation 30 WFL   (Blank rows = not tested)  UPPER EXTREMITY MMT:  MMT Right eval Left eval  Shoulder flexion 4+ 4+  Shoulder extension    Shoulder abduction 4* 4+  Shoulder adduction    Shoulder internal rotation 4* 4+  Shoulder external rotation 4* 4+  Middle trapezius    Lower trapezius    Elbow flexion 4+ 5  Elbow extension 4+ 5  (Blank rows = not tested)  JOINT MOBILITY TESTING:  Joint stiffness noted with AP/PA and inferior mobilization   PALPATION:  Significant trigger points and taut muscle bands at pec minor, R UT, and periscapular region.    TODAY'S TREATMENT:                                                                                                                                         DATE: 03/03/23 Subjective: Pt reports she feels like her shoulder is moving a little better.     Pain 2-3/10 R shoulder at rest  Objective: ROM pre manual tx: abd 90, HBB thumb to L5  Manual Therapy: 20 min Jt mobilizations: R GH distraction, A/P and inf glides Gr III/IV in various positions of flexion, abd (pt in supine hooklying position), seated distraction with IR HBB GR III/IV  PROM  flexion, abd, ER, IR ROM measurements post-manual tx: flexion 140 deg, abd 105 deg, HBB thumb to L3  Therapeutic Exercise: 15 min AAROM flexion, abd with dowel x 10 each- not today Towel IR hand behind back stretch 3x 20 seconds with strap Scapular retractions in standing: 2x10 Scapular retraction + rows: 2x10 RTB Seated thoracic extension x10 over chair Standing row BTB 2 x 15 Standing IR with towel roll at elbow BTB 2 x 15 Standing ER with towel roll at elbow RTB 2 x 15   Not today: Supine R shoulder flexion, abduction 2# DB 2 x 10   Wall slide flexion with bolster: x10 Doorway pec stretch: 30 seconds x 4   PATIENT EDUCATION: Education details: HEP, POC, goals  Person educated: Patient Education method: Explanation, Demonstration, and Handouts Education comprehension: verbalized understanding  HOME EXERCISE PROGRAM: Access Code: [email or web address] URL: [email or web address] Date: 02/23/2023 Prepared by: Max Fickle  Exercises - Pec Minor Stretch  - 2-3 x daily - 7 x weekly - 5-10 reps - 30 hold - Corner Pec Major Stretch  - 2-3 x daily - 7 x weekly - 5-10 reps -  30 hold - Supine Shoulder Flexion with Dowel  - 2-3 x daily - 7 x weekly - 10 reps - 3-5 hold - Supine Shoulder Abduction AAROM with Dowel  - 2-3 x daily - 7 x weekly - 10 reps - 3-5 hold - Shoulder Flexion Wall Slide with Towel  - 1 x daily - 7 x weekly - 5 reps - 10-20 hold - Seated Shoulder Abduction Towel Slide at Table Top  - 1 x daily - 7 x weekly - 5 sets - 5 reps - 30 hold - Doorway Pec Stretch at 60 Elevation  - 1 x daily - 7 x weekly - 5 sets - 5 reps - 30 hold - Seated Shoulder External Rotation PROM on Table  - 1 x daily - 7 x weekly - 5 sets - 5 reps - 30 hold - Standing Row with Anchored Resistance  - 1 x daily - 3 x weekly - 2 sets - 10 reps - Shoulder Internal Rotation with Resistance  - 1 x daily - 3 x weekly - 2 sets - 10 reps - Shoulder External Rotation with Anchored Resistance  - 1 x  daily - 3 x weekly - 2 sets - 10 reps - Standing Shoulder Internal Rotation Stretch with Towel  - 1 x daily - 7 x weekly - 3 reps - 15 hold  ASSESSMENT:  CLINICAL IMPRESSION:   End feel with GH mob is hypomobile.  Able to perform scapular mm retraining with good control.  Overall, making progress with PT.  Patient will benefit from skilled PT interventions to address listed impairments to improve ADL tolerance, pain reduction, and improve pain free R shoulder ROM.   OBJECTIVE IMPAIRMENTS: decreased activity tolerance, decreased ROM, decreased strength, hypomobility, impaired flexibility, impaired UE functional use, and postural dysfunction.   ACTIVITY LIMITATIONS: carrying, lifting, bathing, dressing, reach over head, and hygiene/grooming  PARTICIPATION LIMITATIONS: laundry, shopping, community activity, occupation, and yard work  PERSONAL FACTORS: Past/current experiences, Profession, Time since onset of injury/illness/exacerbation, and 1 comorbidity: breast cancer s/p mastectomy  are also affecting patient's functional outcome.   REHAB POTENTIAL: Good  CLINICAL DECISION MAKING: Stable/uncomplicated  EVALUATION COMPLEXITY: Moderate   GOALS: Goals reviewed with patient? Yes  SHORT TERM GOALS: Target date: 02/28/2023  Patient will be independent in HEP to improve strength/ROM for better functional independence with ADLs. Baseline: 8/26: HEP initiated  Goal status: INITIAL  LONG TERM GOALS: Target date: 03/29/2023  Patient will increase FOTO score to equal to or greater than 69 to demonstrate statistically significant improvement in mobility and quality of life.  Baseline: 8/26: 54 Goal status: INITIAL  2.  Patient will demonstrate adequate R shoulder ROM and strength to be able to clasp bra, dress independently, and perform overhead activities with pain less than 2/10.  Baseline: 8/26: unable to clasp bra with IR limitation  Goal status: INITIAL  3.  Patient will improve B UE  strength by 1/2 MMT grade to improve ability to complete iADLs and job related tasks.  Baseline: 8/26: see above Goal status: INITIAL  4.  Patient will report a worst pain of 2/10 on NRPS in R shoulder to improve tolerance with ADLs and reduced symptoms with activities.  Baseline: 8/26: worst pain 8/10 Goal status: INITIAL  PLAN:  PT FREQUENCY: 1-2x/week  PT DURATION: 8 weeks  PLANNED INTERVENTIONS: Therapeutic exercises, Therapeutic activity, Patient/Family education, Self Care, Joint mobilization, Joint manipulation, DME instructions, Dry Needling, Spinal mobilization, Cryotherapy, Moist heat, Taping, Traction, and Manual therapy  PLAN FOR NEXT SESSION: HEP review, manual therapy- Soft tissue mob axilla (lats/subscap/pec)/GH joint mob, scapulothoracic mob, continue ROM for shoulder ROM deficits; consider IR strap stretch behind back, adding lateral trunk flexion, scapular retractions   Max Fickle, PT, DPT, OCS  Physical Therapist - [facility]  03/03/2023, 5:33 PM

## 2023-03-07 ENCOUNTER — Ambulatory Visit: Payer: 59

## 2023-03-07 DIAGNOSIS — M25611 Stiffness of right shoulder, not elsewhere classified: Secondary | ICD-10-CM | POA: Diagnosis not present

## 2023-03-07 DIAGNOSIS — M6281 Muscle weakness (generalized): Secondary | ICD-10-CM

## 2023-03-07 NOTE — Therapy (Signed)
OUTPATIENT PHYSICAL THERAPY SHOULDER TREATMENT   Patient Name: Marilyn Page MRN: [id number] DOB:1975-07-10, 47 y.o., female Today's Date: 03/07/2023  END OF SESSION:  PT End of Session - 03/07/23 1644     Visit Number 8    Number of Visits 17    Date for PT Re-Evaluation 03/28/23    PT Start Time 1545    PT Stop Time 1630    PT Time Calculation (min) 45 min    Activity Tolerance Patient tolerated treatment well    Behavior During Therapy [facility] for tasks assessed/performed              Past Medical History:  Diagnosis Date   Anemia    Carcinoma of upper-outer quadrant of right breast in female, estrogen receptor positive (HCC) 10/26/2021   Diabetes mellitus without complication (HCC)    Family history of adverse reaction to anesthesia    grandmother had issues but doesnt know what the issue was, she was older.   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 Axilla Butterfly hydro clip-MACRO METASTATIC MAMMARY CARCINOMA   BREAST BIOPSY Right 04/21/2022   [place] RT PLC BREAST LOC DEV   1ST LESION  INC US GUIDE 04/21/2022 [facility]   BREAST RECONSTRUCTION WITH PLACEMENT OF TISSUE EXPANDER AND FLEX HD (ACELLULAR HYDRATED DERMIS) Right 04/21/2022   Procedure: RIGHT BREAST RECONSTRUCTION WITH PLACEMENT OF TISSUE EXPANDER AND FLEX HD (ACELLULAR HYDRATED DERMIS);  Surgeon: Peggye Form, DO;  Location: [facility] ORS;  Service: Plastics;  Laterality: Right;   CESAREAN SECTION  2005/2008   DILATION AND CURETTAGE OF UTERUS  2003   PORTACATH PLACEMENT Left 11/09/2021   Procedure: INSERTION PORT-A-CATH;  Surgeon: Earline Mayotte, MD;  Location:  [facility] ORS;  Service: General;  Laterality: Left;   REMOVAL OF TISSUE EXPANDER AND PLACEMENT OF IMPLANT Right 06/08/2022   Procedure: REMOVAL OF TISSUE EXPANDER;  Surgeon: Peggye Form, DO;  Location: [facility] OR;  Service: Plastics;  Laterality: Right;   SIMPLE MASTECTOMY WITH AXILLARY SENTINEL NODE BIOPSY Right 04/21/2022   Procedure: SIMPLE MASTECTOMY WITH AXILLARY SENTINEL NODE BIOPSY;  Surgeon: Earline Mayotte, MD;  Location: [facility] ORS;  Service: General;  Laterality: Right;  wire localization of lymph node   SKIN BIOPSY Right 11/09/2021   Procedure: PUNCH BIOPSY SKIN, TATTOO LYMPH NODE RIGHT AXILLA;  Surgeon: Earline Mayotte, MD;  Location: [facility] ORS;  Service: General;  Laterality: Right;   WISDOM TOOTH EXTRACTION     Patient Active Problem List   Diagnosis Date Noted   Acquired absence of right breast 06/18/2022   Low vitamin B12 level 05/24/2022   Hypomagnesemia 05/16/2022   Lactic acid acidosis 03/06/2022   Fever and chills 03/05/2022   COVID-19 virus infection 12/28/2021   Thrombocytopenia (HCC) 12/28/2021   Diabetes mellitus (HCC) 12/28/2021   Febrile neutropenia (HCC) 12/27/2021   Sepsis (HCC) 12/27/2021   Genetic testing 12/01/2021   Carcinoma of upper-outer quadrant of right breast in female, estrogen receptor positive (HCC) 10/26/2021   Uterus, adenomyosis 05/14/2021   ASCUS of cervix with negative high risk HPV 04/23/2021   Iron deficiency anemia due to chronic blood loss  04/23/2021   Type 2 diabetes mellitus without complication, without long-term current use of insulin (HCC) 05/18/2020   RLS (restless legs syndrome) 05/14/2020   Menorrhagia with regular cycle 10/14/2017   BMI 37.0-37.9, adult 10/03/2017   History of gestational diabetes 10/03/2017    PCP: [name], NP   REFERRING PROVIDER: [name], MD   REFERRING DIAG:  [id number] (ICD-10-CM) - Stiffness of right shoulder, not elsewhere classified  C50.411,Z17.0 (ICD-10-CM) - Carcinoma  of upper-outer quadrant of right breast in female, estrogen receptor positive    THERAPY DIAG:  Stiffness of right shoulder, not elsewhere classified  Muscle weakness (generalized)  Rationale for Evaluation and Treatment: Rehabilitation  ONSET DATE: 04/2022   SUBJECTIVE:     From Initial evaluation note 01/31/23                                                                                                                                                                                  SUBJECTIVE STATEMENT: Patient has been seeing [name], OT for lymphedema. Patient reports pain and "pulling" on posterior aspect of upper arm. Patient reports difficulty sleeping, reaching overhead, getting dressed (clasping bra), lifting heavy objects.  Patient has pulley system and red theraband. Has been completing stretches 2x/week and pulley/theraband 2-3x/week.  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 Started 6 weeks of radiation 07/2022 and currently in chemotherapy.   Patient referred to PT for R shoulder pain s/p R mastectomy/removal of expanders and radiation. Patient has seen OT for lymphedema and R shoulder ROM ~1 month for maintenance. OT has provided patient with stretches focused on pec and lat muscle tightness (shoulder abduction and flexion in supine with rotation of hips to the L), child's pose, and AAROM shoulder flexion/abduction on the table.    PAIN:  Are you having pain? Yes: NPRS scale: 2/10 - at worst 8/10 Pain location: R shoulder  Pain description: sharp, achy Aggravating factors: reaching, quick movements with arm  Relieving factors: Ice, massage ball   PRECAUTIONS: None  RED FLAGS: None   WEIGHT BEARING RESTRICTIONS: No  FALLS:  Has patient fallen in last 6 months? 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[place]  PLOF: Independent  PATIENT GOALS:to get this to not hurt   NEXT MD VISIT: 02/04/23 - chemo  OBJECTIVE:  DIAGNOSTIC FINDINGS:  N/A  PATIENT SURVEYS:  FOTO 83 with goal of 69  COGNITION: Overall cognitive status: Within functional limits for tasks assessed     SENSATION: WFL  POSTURE: Rounded shoulders, forward head   UPPER EXTREMITY ROM:   Active ROM Right eval Left eval  Shoulder flexion 120 WFL   Shoulder extension    Shoulder abduction 87 WFL   Shoulder adduction    Shoulder internal rotation 35 WFL  Shoulder external rotation 30 WFL   (Blank rows = not tested)  UPPER EXTREMITY MMT:  MMT Right eval Left eval  Shoulder flexion 4+ 4+  Shoulder extension    Shoulder abduction 4* 4+  Shoulder adduction    Shoulder internal rotation 4* 4+  Shoulder external rotation 4* 4+  Middle trapezius    Lower trapezius    Elbow flexion 4+ 5  Elbow extension 4+ 5  (Blank rows = not tested)  JOINT MOBILITY TESTING:  Joint stiffness noted with AP/PA and inferior mobilization   PALPATION:  Significant trigger points and taut muscle bands at pec minor, R UT, and periscapular region.    TODAY'S TREATMENT:                                                                                                                                         DATE: 03/07/23 Subjective: Pt reports she feels like her shoulder is moving a little better.  She is working on her HEP.  The stretch with arm behind her back is the most uncomfortable.       Pain 0/10 R shoulder at rest  Objective: ROM pre manual tx: abd 90, HBB thumb to L5  Manual Therapy: 25 min Jt mobilizations: R GH distraction, A/P and inf glides Gr III/IV in various positions of  flexion, abd (pt in supine hooklying position), seated distraction with IR HBB GR III/IV  PROM flexion, abd, ER, IR ROM measurements post-manual tx: flexion 140 deg, abd 105 deg, HBB thumb to L3  Therapeutic Exercise: 20 min AAROM flexion, abd with dowel x 10 each- not today Towel IR hand behind back stretch 3x 20 seconds with strap Finger ladder: abduction direction 5x with emphasis on relaxing UT substitution- pt notes "tightness" in anterior pec mm, not shoulder Wall physioball roll into flexion with end range flexion stretch x 10 Doorway pec stretch R 5x 30 seconds Scapular retractions in standing: 2x10 Scapular retraction + rows: 2x10 RTB Seated thoracic extension x10 over chair- not today Standing row BTB 2 x 15- HEP Standing IR with towel roll at elbow BTB 2 x 15- HEP Standing ER with towel roll at elbow RTB 2 x 15- HEP  Pt education for how to perform self STM to pec major from anterior portion and also deeper portion of mm   PATIENT EDUCATION: Education details: HEP, POC, goals  Person educated: Patient Education method: Explanation, Demonstration, and Handouts  Education comprehension: verbalized understanding  HOME EXERCISE PROGRAM: Access Code: [email or web address] URL: [email or web address] Date: 02/23/2023 Prepared by: Max Fickle  Exercises - Pec Minor Stretch  - 2-3 x daily - 7 x weekly - 5-10 reps - 30 hold - Corner Pec Major Stretch  - 2-3 x daily - 7 x weekly - 5-10 reps - 30 hold - Supine Shoulder Flexion with Dowel  - 2-3 x daily - 7 x weekly - 10 reps - 3-5 hold - Supine Shoulder Abduction AAROM with Dowel  - 2-3 x daily - 7 x weekly - 10 reps - 3-5 hold - Shoulder Flexion Wall Slide with Towel  - 1 x daily - 7 x weekly - 5 reps - 10-20 hold - Seated Shoulder Abduction Towel Slide at Table Top  - 1 x daily - 7 x weekly - 5 sets - 5 reps - 30 hold - Doorway Pec Stretch at 60 Elevation  - 1 x daily - 7 x weekly - 5 sets - 5 reps - 30 hold - Seated  Shoulder External Rotation PROM on Table  - 1 x daily - 7 x weekly - 5 sets - 5 reps - 30 hold - Standing Row with Anchored Resistance  - 1 x daily - 3 x weekly - 2 sets - 10 reps - Shoulder Internal Rotation with Resistance  - 1 x daily - 3 x weekly - 2 sets - 10 reps - Shoulder External Rotation with Anchored Resistance  - 1 x daily - 3 x weekly - 2 sets - 10 reps - Standing Shoulder Internal Rotation Stretch with Towel  - 1 x daily - 7 x weekly - 3 reps - 15 hold  ASSESSMENT:  CLINICAL IMPRESSION:   End feel with GH mobilization inferior and posterior glide is hypomobile.  Standing abduction AAROM limited by tightness/scar tissue in anterior R pec region.  Overall, making progress with PT.  Patient will benefit from skilled PT interventions to address listed impairments to improve ADL tolerance, pain reduction, and improve pain free R shoulder ROM.   OBJECTIVE IMPAIRMENTS: decreased activity tolerance, decreased ROM, decreased strength, hypomobility, impaired flexibility, impaired UE functional use, and postural dysfunction.   ACTIVITY LIMITATIONS: carrying, lifting, bathing, dressing, reach over head, and hygiene/grooming  PARTICIPATION LIMITATIONS: laundry, shopping, community activity, occupation, and yard work  PERSONAL FACTORS: Past/current experiences, Profession, Time since onset of injury/illness/exacerbation, and 1 comorbidity: breast cancer s/p mastectomy  are also affecting patient's functional outcome.   REHAB POTENTIAL: Good  CLINICAL DECISION MAKING: Stable/uncomplicated  EVALUATION COMPLEXITY: Moderate   GOALS: Goals reviewed with patient? Yes  SHORT TERM GOALS: Target date: 02/28/2023  Patient will be independent in HEP to improve strength/ROM for better functional independence with ADLs. Baseline: 8/26: HEP initiated  Goal status: INITIAL  LONG TERM GOALS: Target date: 03/29/2023  Patient will increase FOTO score to equal to or greater than 69 to demonstrate  statistically significant improvement in mobility and quality of life.  Baseline: 8/26: 54 Goal status: INITIAL  2.  Patient will demonstrate adequate R shoulder ROM and strength to be able to clasp bra, dress independently, and perform overhead activities with pain less than 2/10.  Baseline: 8/26: unable to clasp bra with IR limitation  Goal status: INITIAL  3.  Patient will improve B UE strength by 1/2 MMT grade to improve ability to complete iADLs and job related tasks.  Baseline: 8/26: see above Goal status: INITIAL  4.  Patient will report a worst pain of 2/10  on NRPS in R shoulder to improve tolerance with ADLs and reduced symptoms with activities.  Baseline: 8/26: worst pain 8/10 Goal status: INITIAL  PLAN:  PT FREQUENCY: 1-2x/week  PT DURATION: 8 weeks  PLANNED INTERVENTIONS: Therapeutic exercises, Therapeutic activity, Patient/Family education, Self Care, Joint mobilization, Joint manipulation, DME instructions, Dry Needling, Spinal mobilization, Cryotherapy, Moist heat, Taping, Traction, and Manual therapy  PLAN FOR NEXT SESSION: HEP review, manual therapy- Soft tissue mob axilla (lats/subscap/pec)/GH joint mob, scapulothoracic mob, continue ROM for shoulder ROM deficits; consider IR strap stretch behind back, adding lateral trunk flexion, scapular retractions; resume STM pec next session   Max Fickle, PT, DPT, OCS  Physical Therapist - [facility]  [facility]  03/07/2023, 4:45 PM

## 2023-03-09 ENCOUNTER — Ambulatory Visit: Payer: 59 | Attending: Internal Medicine

## 2023-03-09 DIAGNOSIS — M25611 Stiffness of right shoulder, not elsewhere classified: Secondary | ICD-10-CM | POA: Diagnosis present

## 2023-03-09 DIAGNOSIS — M6281 Muscle weakness (generalized): Secondary | ICD-10-CM | POA: Insufficient documentation

## 2023-03-09 NOTE — Therapy (Signed)
OUTPATIENT PHYSICAL THERAPY SHOULDER TREATMENT   Patient Name: MADALYNN PIZANO MRN: [id number] DOB:Apr 30, 1976, 47 y.o., female Today's Date: 03/09/2023  END OF SESSION:  PT End of Session - 03/09/23 1422     Visit Number 9    Number of Visits 17    Date for PT Re-Evaluation 03/28/23    PT Start Time 1420    PT Stop Time 1505    PT Time Calculation (min) 45 min    Activity Tolerance Patient tolerated treatment well    Behavior During Therapy [facility] for tasks assessed/performed              Past Medical History:  Diagnosis Date   Anemia    Carcinoma of upper-outer quadrant of right breast in female, estrogen receptor positive (HCC) 10/26/2021   Diabetes mellitus without complication (HCC)    Family history of adverse reaction to anesthesia    grandmother had issues but doesnt know what the issue was, she was older.   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 Axilla Butterfly hydro clip-MACRO METASTATIC MAMMARY CARCINOMA   BREAST BIOPSY Right 04/21/2022   [place] RT PLC BREAST LOC DEV   1ST LESION  INC US GUIDE 04/21/2022 [facility]   BREAST RECONSTRUCTION WITH PLACEMENT OF TISSUE EXPANDER AND FLEX HD (ACELLULAR HYDRATED DERMIS) Right 04/21/2022   Procedure: RIGHT BREAST RECONSTRUCTION WITH PLACEMENT OF TISSUE EXPANDER AND FLEX HD (ACELLULAR HYDRATED DERMIS);  Surgeon: Peggye Form, DO;  Location: [facility] ORS;  Service: Plastics;  Laterality: Right;   CESAREAN SECTION  2005/2008   DILATION AND CURETTAGE OF UTERUS  2003   PORTACATH PLACEMENT Left 11/09/2021   Procedure: INSERTION PORT-A-CATH;  Surgeon: Earline Mayotte, MD;  Location:  [facility] ORS;  Service: General;  Laterality: Left;   REMOVAL OF TISSUE EXPANDER AND PLACEMENT OF IMPLANT Right 06/08/2022   Procedure: REMOVAL OF TISSUE EXPANDER;  Surgeon: Peggye Form, DO;  Location: [facility];  Service: Plastics;  Laterality: Right;   SIMPLE MASTECTOMY WITH AXILLARY SENTINEL NODE BIOPSY Right 04/21/2022   Procedure: SIMPLE MASTECTOMY WITH AXILLARY SENTINEL NODE BIOPSY;  Surgeon: Earline Mayotte, MD;  Location: [facility] ORS;  Service: General;  Laterality: Right;  wire localization of lymph node   SKIN BIOPSY Right 11/09/2021   Procedure: PUNCH BIOPSY SKIN, TATTOO LYMPH NODE RIGHT AXILLA;  Surgeon: Earline Mayotte, MD;  Location: [facility] ORS;  Service: General;  Laterality: Right;   WISDOM TOOTH EXTRACTION     Patient Active Problem List   Diagnosis Date Noted   Acquired absence of right breast 06/18/2022   Low vitamin B12 level 05/24/2022   Hypomagnesemia 05/16/2022   Lactic acid acidosis 03/06/2022   Fever and chills 03/05/2022   COVID-19 virus infection 12/28/2021   Thrombocytopenia (HCC) 12/28/2021   Diabetes mellitus (HCC) 12/28/2021   Febrile neutropenia (HCC) 12/27/2021   Sepsis (HCC) 12/27/2021   Genetic testing 12/01/2021   Carcinoma of upper-outer quadrant of right breast in female, estrogen receptor positive (HCC) 10/26/2021   Uterus, adenomyosis 05/14/2021   ASCUS of cervix with negative high risk HPV 04/23/2021   Iron deficiency anemia due to chronic blood loss  04/23/2021   Type 2 diabetes mellitus without complication, without long-term current use of insulin (HCC) 05/18/2020   RLS (restless legs syndrome) 05/14/2020   Menorrhagia with regular cycle 10/14/2017   BMI 37.0-37.9, adult 10/03/2017   History of gestational diabetes 10/03/2017    PCP: [name], NP   REFERRING PROVIDER: [name], MD   REFERRING DIAG:  [phone] (ICD-10-CM) - Stiffness of right shoulder, not elsewhere classified  C50.411,Z17.0 (ICD-10-CM) - Carcinoma  of upper-outer quadrant of right breast in female, estrogen receptor positive    THERAPY DIAG:  Stiffness of right shoulder, not elsewhere classified  Rationale for Evaluation and Treatment: Rehabilitation  ONSET DATE: 04/2022   SUBJECTIVE:     From Initial evaluation note 01/31/23                                                                                                                                                                                  SUBJECTIVE STATEMENT: Patient has been seeing [name], OT for lymphedema. Patient reports pain and "pulling" on posterior aspect of upper arm. Patient reports difficulty sleeping, reaching overhead, getting dressed (clasping bra), lifting heavy objects.  Patient has pulley system and red theraband. Has been completing stretches 2x/week and pulley/theraband 2-3x/week.  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 Started 6 weeks of radiation 07/2022 and currently in chemotherapy.   Patient referred to PT for R shoulder pain s/p R mastectomy/removal of expanders and radiation. Patient has seen OT for lymphedema and R shoulder ROM ~1 month for maintenance. OT has provided patient with stretches focused on pec and lat muscle tightness (shoulder abduction and flexion in supine with rotation of hips to the L), child's pose, and AAROM shoulder flexion/abduction on the table.    PAIN:  Are you having pain? Yes: NPRS scale: 2/10 - at worst 8/10 Pain location: R shoulder  Pain description: sharp, achy Aggravating factors: reaching, quick movements with arm  Relieving factors: Ice, massage ball   PRECAUTIONS: None  RED FLAGS: None   WEIGHT BEARING RESTRICTIONS: No  FALLS:  Has patient fallen in last 6 months? 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 [place]  PLOF: Independent  PATIENT GOALS:to get this to not hurt   NEXT MD VISIT: 02/04/23 - chemo  OBJECTIVE:   DIAGNOSTIC FINDINGS:  N/A  PATIENT SURVEYS:  FOTO 32 with goal of 58  COGNITION: Overall cognitive status: Within functional limits for tasks assessed     SENSATION: WFL  POSTURE: Rounded shoulders, forward head   UPPER EXTREMITY ROM:   Active ROM Right eval Left eval  Shoulder flexion 120 WFL   Shoulder extension    Shoulder abduction 87 WFL   Shoulder adduction    Shoulder internal rotation 35 WFL  Shoulder external rotation 30 WFL   (Blank rows = not tested)  UPPER EXTREMITY MMT:  MMT Right eval Left eval  Shoulder flexion 4+ 4+  Shoulder extension    Shoulder abduction 4* 4+  Shoulder adduction    Shoulder internal rotation 4* 4+  Shoulder external rotation 4* 4+  Middle trapezius    Lower trapezius    Elbow flexion 4+ 5  Elbow extension 4+ 5  (Blank rows = not tested)  JOINT MOBILITY TESTING:  Joint stiffness noted with AP/PA and inferior mobilization   PALPATION:  Significant trigger points and taut muscle bands at pec minor, R UT, and periscapular region.    TODAY'S TREATMENT:                                                                                                                                         DATE: 03/09/23 Subjective: Pt reports she was not sore after her PT last session.  She has been working on her HEP and trying to use her arm with the available motion she has    Pain 0/10 R shoulder at rest  Objective:  Manual Therapy: 30 min Jt mobilizations: R GH distraction, A/P and inf glides Gr III/IV in various positions of flexion, abd (pt in supine hooklying position), seated distraction with IR HBB GR  III/IV  STM/TPR R latissimus dorsi, subscap, teres major, teres minor, UT, pec major/minor  Manual pec minor stretch in sidelying 3 min  PROM flexion, abd, ER, IR- 5-10x ea  Therapeutic Exercise: 10 min AAROM flexion, abd with dowel x 10 each- not today Towel IR hand behind back stretch 3x 20 seconds with strap Finger ladder: abduction direction 5x with emphasis on relaxing UT substitution- pt notes "tightness" in anterior pec mm region Wall physioball roll into flexion with end range flexion stretch x 10 Doorway pec stretch R 5x 30 seconds Scapular retractions in standing: 2x10 Scapular retraction + rows: 2x10 RTB- not today Seated thoracic extension x10 over chair- not today Standing row BTB 2 x 15- HEP- not today Standing IR with towel roll at elbow BTB 2 x 15- HEP Standing ER with towel roll at elbow RTB 2 x 15- HEP  Pt education for how to perform self TPR with lacrosse ball at wall for teres minor, teres major, and seated using back of chair for latissimus dorsi   PATIENT EDUCATION: Education details: HEP, POC, goals, self TPR techniques Person educated: Patient  Education method: Explanation, Demonstration, and Handouts Education comprehension: verbalized understanding  HOME EXERCISE PROGRAM: Access Code: [email or web address] URL: [email or web address] Date: 02/23/2023 Prepared by: Max Fickle  Exercises - Pec Minor Stretch  - 2-3 x daily - 7 x weekly - 5-10 reps - 30 hold - Corner Pec Major Stretch  - 2-3 x daily - 7 x weekly - 5-10 reps - 30 hold - Supine Shoulder Flexion with Dowel  - 2-3 x daily - 7 x weekly - 10 reps - 3-5 hold - Supine Shoulder Abduction AAROM with Dowel  - 2-3 x daily - 7 x weekly - 10 reps - 3-5 hold - Shoulder Flexion Wall Slide with Towel  - 1 x daily - 7 x weekly - 5 reps - 10-20 hold - Seated Shoulder Abduction Towel Slide at Table Top  - 1 x daily - 7 x weekly - 5 sets - 5 reps - 30 hold - Doorway Pec Stretch at 60 Elevation  - 1 x  daily - 7 x weekly - 5 sets - 5 reps - 30 hold - Seated Shoulder External Rotation PROM on Table  - 1 x daily - 7 x weekly - 5 sets - 5 reps - 30 hold - Standing Row with Anchored Resistance  - 1 x daily - 3 x weekly - 2 sets - 10 reps - Shoulder Internal Rotation with Resistance  - 1 x daily - 3 x weekly - 2 sets - 10 reps - Shoulder External Rotation with Anchored Resistance  - 1 x daily - 3 x weekly - 2 sets - 10 reps - Standing Shoulder Internal Rotation Stretch with Towel  - 1 x daily - 7 x weekly - 3 reps - 15 hold  ASSESSMENT:  CLINICAL IMPRESSION:   End feel with GH mobilization inferior and posterior glide is hypomobile.  Standing abduction AAROM limited by tightness/scar tissue in anterior R pec region.  She reports significant tenderness with manual therapy for MTPs in R shoulder musculature.  Overall, making progress with PT.  Patient will benefit from skilled PT interventions to address listed impairments to improve ADL tolerance, pain reduction, and improve pain free R shoulder ROM.   OBJECTIVE IMPAIRMENTS: decreased activity tolerance, decreased ROM, decreased strength, hypomobility, impaired flexibility, impaired UE functional use, and postural dysfunction.   ACTIVITY LIMITATIONS: carrying, lifting, bathing, dressing, reach over head, and hygiene/grooming  PARTICIPATION LIMITATIONS: laundry, shopping, community activity, occupation, and yard work  PERSONAL FACTORS: Past/current experiences, Profession, Time since onset of injury/illness/exacerbation, and 1 comorbidity: breast cancer s/p mastectomy  are also affecting patient's functional outcome.   REHAB POTENTIAL: Good  CLINICAL DECISION MAKING: Stable/uncomplicated  EVALUATION COMPLEXITY: Moderate   GOALS: Goals reviewed with patient? Yes  SHORT TERM GOALS: Target date: 02/28/2023  Patient will be independent in HEP to improve strength/ROM for better functional independence with ADLs. Baseline: 8/26: HEP initiated   Goal status: INITIAL  LONG TERM GOALS: Target date: 03/29/2023  Patient will increase FOTO score to equal to or greater than 69 to demonstrate statistically significant improvement in mobility and quality of life.  Baseline: 8/26: 54 Goal status: INITIAL  2.  Patient will demonstrate adequate R shoulder ROM and strength to be able to clasp bra, dress independently, and perform overhead activities with pain less than 2/10.  Baseline: 8/26: unable to clasp bra with IR limitation  Goal status: INITIAL  3.  Patient will improve B UE strength by 1/2 MMT grade to improve ability to complete iADLs and job related  tasks.  Baseline: 8/26: see above Goal status: INITIAL  4.  Patient will report a worst pain of 2/10 on NRPS in R shoulder to improve tolerance with ADLs and reduced symptoms with activities.  Baseline: 8/26: worst pain 8/10 Goal status: INITIAL  PLAN:  PT FREQUENCY: 1-2x/week  PT DURATION: 8 weeks  PLANNED INTERVENTIONS: Therapeutic exercises, Therapeutic activity, Patient/Family education, Self Care, Joint mobilization, Joint manipulation, DME instructions, Dry Needling, Spinal mobilization, Cryotherapy, Moist heat, Taping, Traction, and Manual therapy  PLAN FOR NEXT SESSION: joint mobilizations, scapulothoracic mob, continue ROM for shoulder ROM deficits; scapular retractions; continue manual therapy techniques STM/TPR   Max Fickle, PT, DPT, OCS  Physical Therapist - [facility]  03/09/2023, 2:23 PM

## 2023-03-14 ENCOUNTER — Ambulatory Visit: Payer: 59

## 2023-03-14 DIAGNOSIS — M6281 Muscle weakness (generalized): Secondary | ICD-10-CM

## 2023-03-14 DIAGNOSIS — M25611 Stiffness of right shoulder, not elsewhere classified: Secondary | ICD-10-CM

## 2023-03-18 ENCOUNTER — Inpatient Hospital Stay: Payer: 59 | Attending: Internal Medicine

## 2023-03-18 ENCOUNTER — Encounter: Payer: Self-pay | Admitting: Internal Medicine

## 2023-03-18 ENCOUNTER — Ambulatory Visit: Payer: 59

## 2023-03-18 ENCOUNTER — Inpatient Hospital Stay: Payer: 59 | Admitting: Internal Medicine

## 2023-03-18 ENCOUNTER — Inpatient Hospital Stay: Payer: 59

## 2023-03-18 ENCOUNTER — Other Ambulatory Visit: Payer: 59

## 2023-03-18 ENCOUNTER — Ambulatory Visit: Payer: 59 | Admitting: Internal Medicine

## 2023-03-18 VITALS — BP 115/62 | HR 77 | Temp 97.9°F | Resp 16 | Ht 66.0 in | Wt 208.0 lb

## 2023-03-18 VITALS — BP 115/49 | HR 73 | Temp 98.7°F | Resp 17

## 2023-03-18 DIAGNOSIS — C50411 Malignant neoplasm of upper-outer quadrant of right female breast: Secondary | ICD-10-CM | POA: Diagnosis not present

## 2023-03-18 DIAGNOSIS — D696 Thrombocytopenia, unspecified: Secondary | ICD-10-CM | POA: Insufficient documentation

## 2023-03-18 DIAGNOSIS — E876 Hypokalemia: Secondary | ICD-10-CM | POA: Insufficient documentation

## 2023-03-18 DIAGNOSIS — Z170 Estrogen receptor positive status [ER+]: Secondary | ICD-10-CM | POA: Diagnosis not present

## 2023-03-18 DIAGNOSIS — Z79810 Long term (current) use of selective estrogen receptor modulators (SERMs): Secondary | ICD-10-CM | POA: Insufficient documentation

## 2023-03-18 DIAGNOSIS — Z5112 Encounter for antineoplastic immunotherapy: Secondary | ICD-10-CM | POA: Diagnosis present

## 2023-03-18 DIAGNOSIS — G629 Polyneuropathy, unspecified: Secondary | ICD-10-CM | POA: Diagnosis not present

## 2023-03-18 DIAGNOSIS — E119 Type 2 diabetes mellitus without complications: Secondary | ICD-10-CM | POA: Insufficient documentation

## 2023-03-18 LAB — COMPREHENSIVE METABOLIC PANEL
ALT: 25 U/L (ref 0–44)
AST: 37 U/L (ref 15–41)
Albumin: 3.2 g/dL — ABNORMAL LOW (ref 3.5–5.0)
Alkaline Phosphatase: 50 U/L (ref 38–126)
Anion gap: 6 (ref 5–15)
BUN: 12 mg/dL (ref 6–20)
CO2: 26 mmol/L (ref 22–32)
Calcium: 8.8 mg/dL — ABNORMAL LOW (ref 8.9–10.3)
Chloride: 105 mmol/L (ref 98–111)
Creatinine, Ser: 0.64 mg/dL (ref 0.44–1.00)
GFR, Estimated: >60 mL/min (ref >60)
Glucose, Bld: 173 mg/dL — ABNORMAL HIGH (ref 70–99)
Potassium: 3 mmol/L — ABNORMAL LOW (ref 3.5–5.1)
Sodium: 137 mmol/L (ref 135–145)
Total Bilirubin: 0.7 mg/dL (ref 0.3–1.2)
Total Protein: 6.9 g/dL (ref 6.5–8.1)

## 2023-03-18 LAB — CBC WITH DIFFERENTIAL/PLATELET
Abs Immature Granulocytes: 0.01 10*3/uL (ref 0.00–0.07)
Basophils Absolute: 0 10*3/uL (ref 0.0–0.1)
Basophils Relative: 1 %
Eosinophils Absolute: 0.1 10*3/uL (ref 0.0–0.5)
Eosinophils Relative: 2 %
HCT: 34.1 % — ABNORMAL LOW (ref 36.0–46.0)
Hemoglobin: 11.2 g/dL — ABNORMAL LOW (ref 12.0–15.0)
Immature Granulocytes: 0 %
Lymphocytes Relative: 26 %
Lymphs Abs: 1.2 10*3/uL (ref 0.7–4.0)
MCH: 28.1 pg (ref 26.0–34.0)
MCHC: 32.8 g/dL (ref 30.0–36.0)
MCV: 85.5 fL (ref 80.0–100.0)
Monocytes Absolute: 0.3 10*3/uL (ref 0.1–1.0)
Monocytes Relative: 6 %
Neutro Abs: 3.1 10*3/uL (ref 1.7–7.7)
Neutrophils Relative %: 65 %
Platelets: 90 10*3/uL — ABNORMAL LOW (ref 150–400)
RBC: 3.99 MIL/uL (ref 3.87–5.11)
RDW: 15.8 % — ABNORMAL HIGH (ref 11.5–15.5)
WBC: 4.8 10*3/uL (ref 4.0–10.5)
nRBC: 0 % (ref 0.0–0.2)

## 2023-03-18 MED ORDER — HEPARIN SOD (PORK) LOCK FLUSH 100 UNIT/ML IV SOLN
500.0000 [IU] | Freq: Once | INTRAVENOUS | Status: AC | PRN
  Administered 2023-03-18: 500 [IU]
  Filled 2023-03-18: qty 5

## 2023-03-18 MED ORDER — SODIUM CHLORIDE 0.9 % IV SOLN
3.6000 mg/kg | Freq: Once | INTRAVENOUS | Status: AC
  Administered 2023-03-18: 360 mg via INTRAVENOUS
  Filled 2023-03-18: qty 8

## 2023-03-18 MED ORDER — DIPHENHYDRAMINE HCL 25 MG PO CAPS
25.0000 mg | ORAL_CAPSULE | Freq: Once | ORAL | Status: AC
  Administered 2023-03-18: 25 mg via ORAL
  Filled 2023-03-18: qty 1

## 2023-03-18 MED ORDER — ACETAMINOPHEN 325 MG PO TABS
650.0000 mg | ORAL_TABLET | Freq: Once | ORAL | Status: AC
  Administered 2023-03-18: 650 mg via ORAL
  Filled 2023-03-18: qty 2

## 2023-03-18 MED ORDER — SODIUM CHLORIDE 0.9 % IV SOLN
Freq: Once | INTRAVENOUS | Status: AC
  Filled 2023-03-18: qty 250

## 2023-03-18 MED ORDER — PROCHLORPERAZINE MALEATE 10 MG PO TABS
10.0000 mg | ORAL_TABLET | Freq: Once | ORAL | Status: AC
  Administered 2023-03-18: 10 mg via ORAL
  Filled 2023-03-18: qty 1

## 2023-03-18 NOTE — Patient Instructions (Signed)
[facility]  Discharge Instructions: Thank you for choosing [facility] to provide your oncology and hematology care.  If you have a lab appointment with the Cancer Center, please go directly to the Cancer Center and check in at the registration area.  Wear comfortable clothing and clothing appropriate for easy access to any Portacath or PICC line.   We strive to give you quality time with your provider. You may need to reschedule your appointment if you arrive late (15 or more minutes).  Arriving late affects you and other patients whose appointments are after yours.  Also, if you miss three or more appointments without notifying the office, you may be dismissed from the clinic at the provider's discretion.      For prescription refill requests, have your pharmacy contact our office and allow 72 hours for refills to be completed.    Today you received the following chemotherapy and/or immunotherapy agents Kadcyla      To help prevent nausea and vomiting after your treatment, we encourage you to take your nausea medication as directed.  BELOW ARE SYMPTOMS THAT SHOULD BE REPORTED IMMEDIATELY: *FEVER GREATER THAN 100.4 F (38 C) OR HIGHER *CHILLS OR SWEATING *NAUSEA AND VOMITING THAT IS NOT CONTROLLED WITH YOUR NAUSEA MEDICATION *UNUSUAL SHORTNESS OF BREATH *UNUSUAL BRUISING OR BLEEDING *URINARY PROBLEMS (pain or burning when urinating, or frequent urination) *BOWEL PROBLEMS (unusual diarrhea, constipation, pain near the anus) TENDERNESS IN MOUTH AND THROAT WITH OR WITHOUT PRESENCE OF ULCERS (sore throat, sores in mouth, or a toothache) UNUSUAL RASH, SWELLING OR PAIN  UNUSUAL VAGINAL DISCHARGE OR ITCHING   Items with * indicate a potential emergency and should be followed up as soon as possible or go to the Emergency Department if any problems should occur.  Please show the CHEMOTHERAPY ALERT CARD or IMMUNOTHERAPY ALERT CARD at check-in to  the Emergency Department and triage nurse.  Should you have questions after your visit or need to cancel or reschedule your appointment, please contact [facility] [facility]  [phone] and follow the prompts.  Office hours are 8:00 a.m. to 4:30 p.m. Monday - Friday. Please note that voicemails left after 4:00 p.m. may not be returned until the following business day.  We are closed weekends and major holidays. You have access to a nurse at all times for urgent questions. Please call the main number to the clinic [phone] and follow the prompts.  For any non-urgent questions, you may also contact your provider using MyChart. We now offer e-Visits for anyone 18 and older to request care online for non-urgent symptoms. For details visit [email or web address].   Also download the MyChart app! Go to the app store, search "MyChart", open the app, select [facility], and log in with your MyChart username and password.    

## 2023-03-18 NOTE — Progress Notes (Signed)
No concerns today 

## 2023-03-18 NOTE — Progress Notes (Signed)
[facility] CONSULT NOTE  [name], [name], [name], [name]  CHIEF COMPLAINTS/PURPOSE OF CONSULTATION: Breast cancer  Oncology History Overview Note  MAY 2023-    Targeted ultrasound is performed, showing a 2.9 x 1.9 x 2.0 cm irregular hypoechoic mass right breast 10 o'clock position 6 cm from nipple at the site of palpable concern.   There is an abnormal 8 mm thickened right axillary lymph node.   There is skin thickening overlying the right breast. ------------------  A. BREAST, RIGHT AT 10:00, 6 CM FROM THE NIPPLE; ULTRASOUND-GUIDED CORE  NEEDLE BIOPSY:  - INVASIVE MAMMARY CARCINOMA, NO SPECIAL TYPE.   Size of invasive carcinoma: 11 mm in this sample  Histologic grade of invasive carcinoma: Grade 2                       Glandular/tubular differentiation score: 3                       Nuclear pleomorphism score: 2                       Mitotic rate score: 1                       Total score: 6  Ductal carcinoma in situ: Present, intermediate grade  Lymphovascular invasion: Not identified   B. LYMPH NODE, RIGHT AXILLARY; ULTRASOUND-GUIDED CORE NEEDLE BIOPSY:  - 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 IN GREATEST  EXTENT.   Comment:  The malignancy in the primary breast lesion appears morphologically  different from tumor in the lymph node sampling, with tumor present in  lymph node more suggestive of a histologic grade 3, with significantly  increased mitotic activity and pleomorphism. Due to the discrepancy in  these morphologic patterns, ER/PR/HER2 immunohistochemistry will be  performed on both blocks A1 and B1, with reflex to FISH for HER2 2+. The  results will be reported in an addendum.   ADDENDUM:  CASE SUMMARY: BREAST BIOMARKER TESTS - A - BREAST, RIGHT AT 10:00  Estrogen Receptor  (ER) Status: POSITIVE          Percentage of cells with nuclear positivity: 71-80%          Average intensity of staining: Strong   Progesterone Receptor (PgR) Status: POSITIVE          Percentage of cells with nuclear positivity: 81-90%          Average intensity of staining: Strong   HER2 (by immunohistochemistry): POSITIVE (Score 3+)       Percentage of cells with uniform intense complete membrane  staining: 61-70%  HER2 displays a heterogeneous staining pattern, with areas showing a  strong 3+ staining pattern, and other regions with complete absence (0+)  of staining.   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  Discussed with Dr. [name] tumor heterogeneity. BREAST MRI- JUNE 2023- The patient's known malignancy in the upper outer quadrant of the right breast measures 3.8 x 2.8 x 3.6 cm. Numerous other suspicious masses are identified in the right breast as above involving the upper outer and upper inner quadrants, located both anterior and posterior to the known malignancy.  Numerous enhancing foci in the skin as above are worrisome for skin metastases ; Bx proven. Biopsy proven metastatic node in the right axilla.  No evidence of malignancy in the left breast;  Two mildly prominent right internal mammary nodes were not FDG avid on recent PET-CT imaging.  Currently on neoadjuvant chemotherapy- TCH  plus P x6 cycles;  MUGA scan May 31st, 2023- -61%.    # June 7th, 2023- NEO-ADJUVANT CHEMO- TCH+P x6 cycles- s/p Mastectomy [NOV 15th, 2023] [Dr.Byrnett]-  ypT2 [49mm]; ypN0.  # JAN mid, 2024 -TAMOXFEN 20 mg a day; ADD LupronFEB 2nd.2 2024 s/p RT maech, 22nd- 2024  # FEB 2nd, 2023- Kadcyla q 3 W x14 cycles;   # Lupron q3 M [/start 2/07-2022]   # Genetic counseling s/p- NEG for any deleterious mutations.     Carcinoma of upper-outer quadrant of right breast in female, estrogen receptor positive (HCC)  10/26/2021 Initial Diagnosis   Carcinoma of upper-outer quadrant of right breast in female, estrogen receptor positive (HCC)   10/26/2021 Cancer Staging   Staging form: Breast, AJCC 8th Edition - Clinical: Stage IIIA (cT4d, cN1, cM0, G2, ER+, PR+, HER2+) - Signed by [name], MD on 06/04/2022 Histologic grading system: 3 grade system   11/10/2021 - 03/01/2022 Chemotherapy   Patient is on Treatment Plan : BREAST  Docetaxel + Carboplatin + Trastuzumab + Pertuzumab  (TCHP) q21d      11/11/2021 - 01/13/2022 Chemotherapy   Patient is on Treatment Plan : BREAST  Docetaxel + Carboplatin + Trastuzumab + Pertuzumab  (TCHP) q21d       Genetic Testing   Negative genetic testing. No pathogenic variants identified on the [facility] CancerNext-Expanded+RNA panel. The report date is 11/29/2021.  The CancerNext-Expanded + RNAinsight gene panel offered by [employer] and includes sequencing and rearrangement analysis for the following 77 genes: IP, ALK, APC*, ATM*, AXIN2, BAP1, BARD1, BLM, BMPR1A, BRCA1*, BRCA2*, BRIP1*, CDC73, CDH1*,CDK4, CDKN1B, CDKN2A, CHEK2*, CTNNA1, DICER1, FANCC, FH, FLCN, GALNT12, KIF1B, LZTR1, MAX, MEN1, MET, MLH1*, MSH2*, MSH3, MSH6*, MUTYH*, NBN, NF1*, NF2, NTHL1, PALB2*, PHOX2B, PMS2*, POT1, PRKAR1A, PTCH1, PTEN*, RAD51C*, RAD51D*,RB1, RECQL, RET, SDHA, SDHAF2, SDHB, SDHC, SDHD, SMAD4, SMARCA4, SMARCB1, SMARCE1, STK11, SUFU, TMEM127, TP53*,TSC1, TSC2, VHL and XRCC2 (sequencing  and deletion/duplication); EGFR, EGLN1, HOXB13, KIT, MITF, PDGFRA, POLD1 and POLE (sequencing only); EPCAM and GREM1 (deletion/duplication only).   07/09/2022 -  Chemotherapy   Patient is on Treatment Plan : BREAST ADO-Trastuzumab Emtansine (Kadcyla) q21d      HISTORY OF PRESENTING ILLNESS: Patient is ambulating independently.  Alone.   Marilyn Page 47 y.o.  female right breast TRIPLE POSITIVE with T4- Stage III -s/p mastectomy-with expanders-currently on adjuvant kadcyla / tamoxifen + OFS  is here for follow-up.    Patient states that she is doing well, and she doesn't;t have any new questions or concerns today.   Continues to have mild tightness of right chest  wall. Started on PT with [facility]. Patient states she is compliant with therapy oral Potassium.  Mild  neuropathy.   Review of Systems  Constitutional:  Positive for malaise/fatigue. Negative for chills, diaphoresis, fever and weight loss.  HENT:  Negative for nosebleeds and sore throat.   Eyes:  Negative for double vision.  Respiratory:  Negative for cough, hemoptysis, sputum production, shortness of breath and wheezing.   Cardiovascular:  Negative for chest pain, palpitations, orthopnea and leg swelling.  Gastrointestinal:  Negative for abdominal pain, blood in stool, constipation, diarrhea, heartburn, melena, nausea and vomiting.  Genitourinary:  Negative for dysuria, frequency and urgency.  Musculoskeletal:  Positive for myalgias. Negative for back pain and joint pain.  Skin: Negative.  Negative for itching and rash.  Neurological:  Negative for dizziness, tingling, focal weakness, weakness and headaches.  Endo/Heme/Allergies:  Does not bruise/bleed easily.  Psychiatric/Behavioral:  Negative for depression. The patient is not nervous/anxious and does not have insomnia.      MEDICAL HISTORY:  Past Medical History:  Diagnosis Date   Anemia    Carcinoma of upper-outer quadrant of right breast in female, estrogen receptor  positive (HCC) 10/26/2021   Diabetes mellitus without complication (HCC)    Family history of adverse reaction to anesthesia    grandmother had issues but doesnt know what the issue was, she was older.   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 Axilla Butterfly hydro clip-MACRO METASTATIC MAMMARY CARCINOMA   BREAST BIOPSY Right 04/21/2022   [place] RT PLC BREAST LOC DEV   1ST LESION  INC US GUIDE 04/21/2022 [facility]   BREAST RECONSTRUCTION WITH PLACEMENT OF TISSUE EXPANDER AND FLEX HD (ACELLULAR HYDRATED DERMIS) Right 04/21/2022   Procedure: RIGHT BREAST RECONSTRUCTION WITH PLACEMENT OF TISSUE EXPANDER AND FLEX HD (ACELLULAR HYDRATED DERMIS);  Surgeon: Peggye Form, DO;  Location: [facility] ORS;  Service: Plastics;  Laterality: Right;   CESAREAN SECTION  2005/2008   DILATION AND CURETTAGE OF UTERUS  2003   PORTACATH PLACEMENT Left 11/09/2021   Procedure: INSERTION PORT-A-CATH;  Surgeon: Earline Mayotte, MD;  Location: [facility];  Service: General;  Laterality: Left;   REMOVAL OF TISSUE EXPANDER AND PLACEMENT OF IMPLANT Right 06/08/2022   Procedure: REMOVAL OF TISSUE EXPANDER;  Surgeon: Peggye Form, DO;  Location: [facility];  Service: Plastics;  Laterality: Right;   SIMPLE MASTECTOMY WITH AXILLARY SENTINEL NODE BIOPSY Right 04/21/2022   Procedure: SIMPLE MASTECTOMY WITH AXILLARY SENTINEL NODE BIOPSY;  Surgeon: Earline Mayotte, MD;  Location: [facility];  Service: General;  Laterality: Right;  wire localization of lymph node   SKIN BIOPSY Right 11/09/2021   Procedure: PUNCH BIOPSY SKIN, TATTOO LYMPH NODE RIGHT AXILLA;  Surgeon:  Earline Mayotte, MD;  Location: [facility];  Service: General;  Laterality: Right;   WISDOM TOOTH EXTRACTION      SOCIAL HISTORY: Social History   Socioeconomic History   Marital status: Married    Spouse name: Fraser Din   Number of children: 2   Years of education: Not on file   Highest education level: Not on file  Occupational History   Occupation:  and teacher's assistant  Tobacco Use   Smoking status: Never   Smokeless tobacco: Never  Vaping Use   Vaping status: Never Used  Substance and Sexual Activity   Alcohol use: Never   Drug use: Never   Sexual activity: Yes    Birth control/protection: Other-see comments, Surgical    Comment: vasectomy  Other Topics Concern   Not on file  Social History Narrative   Pre-school teacher; in [place]; 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    Social Determinants of Health   Financial Resource Strain: Not on file  Food Insecurity: Not on file  Transportation Needs: Not on file  Physical Activity: Inactive (09/26/2017)   Exercise Vital Sign    Days of Exercise per Week: 0 days    Minutes of Exercise per Session: 0 min  Stress: Not on file  Social Connections: Not on file  Intimate Partner Violence: Not on file    FAMILY HISTORY: Family History  Problem Relation Age of Onset   Ovarian cysts Mother    Migraines Mother    Melanoma Mother        on leg   Hyperlipidemia Father    Diabetes Maternal Aunt    Stomach cancer Paternal Aunt    Diabetes Maternal Grandfather    Lymphoma Cousin 17   Breast cancer Neg Hx     ALLERGIES:  is allergic to metformin and oxycodone.  MEDICATIONS:  Current Outpatient Medications  Medication Sig Dispense Refill   acetaminophen (TYLENOL) 325 MG tablet Take 2 tablets (650 mg total) by mouth every 6 (six) hours as needed for mild pain (or Fever >/= 101).     ascorbic acid (VITAMIN C) 500 MG tablet Take 1 tablet (500 mg total) by mouth daily. 30 tablet    calcium-vitamin D (OSCAL WITH D) 500-5 MG-MCG tablet  Take 1 tablet by mouth 2 (two) times daily. 60 tablet 2   Cyanocobalamin (VITAMIN B-12 PO) Take 1 tablet by mouth daily.     Dapagliflozin Propanediol (FARXIGA PO) Take 10 mg by mouth daily.     ferrous sulfate 325 (65 FE) MG tablet Take 325 mg by mouth 2 (two) times daily with a meal.     lidocaine-prilocaine (EMLA) cream Apply on the port. 30 -45 min  prior to port access. 30 g 3   loratadine (CLARITIN) 10 MG tablet Take 10 mg by mouth as needed for allergies. Day before tx, day of and day after.     Magnesium Cl-Calcium Carbonate (SLOW MAGNESIUM/CALCIUM) 70-117 MG TBEC Take 1 tablet by mouth 2 (two) times daily. 60 tablet 6   Multiple Vitamin (MULTIVITAMIN WITH MINERALS) TABS tablet Take 1 tablet by mouth daily.     ondansetron (ZOFRAN) 4 MG tablet Take 1 tablet (4 mg total) by mouth every 8 (eight) hours as needed for up to 20 doses for nausea or vomiting. 20 tablet 0   ondansetron (ZOFRAN-ODT) 8 MG disintegrating tablet Take 8 mg by mouth every 8 (eight) hours as needed for nausea or vomiting.     ONETOUCH VERIO test strip SMARTSIG:1 Each Via Meter Daily PRN     pioglitazone (ACTOS) 15 MG tablet Take 15 mg by mouth daily.     potassium chloride (KLOR-CON) 20 MEQ packet Take 20 mEq by mouth 2 (two) times daily. 60 packet 3   prochlorperazine (COMPAZINE) 10 MG tablet Take 1 tablet (10 mg total) by mouth every 6 (six) hours as needed for nausea or vomiting. 40 tablet 1   tamoxifen (NOLVADEX)  20 MG tablet TAKE 1 TABLET BY MOUTH EVERY DAY 90 tablet 1   No current facility-administered medications for this visit.      Marland Kitchen  PHYSICAL EXAMINATION: ECOG PERFORMANCE STATUS: 0 - Asymptomatic  Vitals:   03/18/23 1337  BP: 115/62  Pulse: 77  Resp: 16  Temp: 97.9 F (36.6 C)  SpO2: 100%         Filed Weights   03/18/23 1337  Weight: 208 lb (94.3 kg)    Physical Exam Vitals and nursing note reviewed.  HENT:     Head: Normocephalic and atraumatic.     Mouth/Throat:     Pharynx:  Oropharynx is clear.  Eyes:     Extraocular Movements: Extraocular movements intact.     Pupils: Pupils are equal, round, and reactive to light.  Cardiovascular:     Rate and Rhythm: Normal rate and regular rhythm.  Pulmonary:     Comments: Decreased breath sounds bilaterally.  Abdominal:     Palpations: Abdomen is soft.  Musculoskeletal:        General: Normal range of motion.     Cervical back: Normal range of motion.  Skin:    General: Skin is warm.  Neurological:     General: No focal deficit present.     Mental Status: She is alert and oriented to person, place, and time.  Psychiatric:        Behavior: Behavior normal.        Judgment: Judgment normal.      LABORATORY DATA:  I have reviewed the data as listed Lab Results  Component Value Date   WBC 4.8 03/18/2023   HGB 11.2 (L) 03/18/2023   HCT 34.1 (L) 03/18/2023   MCV 85.5 03/18/2023   PLT 90 (L) 03/18/2023   Recent Labs    02/04/23 0844 02/25/23 1306 03/18/23 1328  NA 138 138 137  K 3.1* 3.1* 3.0*  CL 106 105 105  CO2 26 25 26   GLUCOSE 148* 156* 173*  BUN 11 13 12   CREATININE 0.46 0.68 0.64  CALCIUM 8.8* 8.7* 8.8*  GFRNONAA >60 >60 >60  PROT 6.8 7.0 6.9  ALBUMIN 3.1* 3.3* 3.2*  AST 34 37 37  ALT 27 31 25   ALKPHOS 47 51 50  BILITOT 0.4 0.5 0.7    RADIOGRAPHIC STUDIES: I have personally reviewed the radiological images as listed and agreed with the findings in the report. No results found.  ASSESSMENT & PLAN:   Carcinoma of upper-outer quadrant of right breast in female, estrogen receptor positive (HCC) # MAY 2023- STAGE III-T4N1 ER/PR positive HER2 POSITIVE RIGHT breast cancer; LN-positive-ER/PR positive her-2 NEGATIVE; # s/p neoadjuvant chemotherapy- TCH plus P x 6 cycles- s/p Right mastectomy-[15th, NOv 2023] ypT2 [79mm]; ypN0.  Given partial response/involvement of the breast skin/multifocal disease- s/p RT- 08/27/2022. atient currently on tamoxifen [DEC-JAN 2024]; + OFS- Lupron every 3 months  ]. ON Ado-trastuzumab emtansine  for 14 cycles.   # proceed with Ado-trastuzumab emtansine #13 of planned 14 cycles.  MUGA- June 13th, 2024- Normal LEFT ventricular ejection fraction -58%   stable. Left mammogram- wnl.  Stable.oct 18th, 2024- E cho- pending. Patient will follow in DEC, 2024- re: zometa infusion.   # Mild Thrombocytopenia- >90  monitor for now. Stable.   # Allergic reaction to Ado-trastuzumab emtansine-fever/rash- currently on Claritin x3 days postchemotherapy; Tylenol preemptively.  Decreased Benadryl 25 mg given drowsiness. Stable.    # Hypocalcemia-  ON adjuvant Zometa q 6 months x 3 years [first-  May-17th, 2024].   Continue ca+Vit D BID. Vit D FEB 2024- 54; [albumin 3.0]-  Stable.  # Right sided swelling/tightness- s/p  Maureen-  Stable.  # Infusion reaction - ZOmeta [flu like symtoms]- imprived with NSAIDs-  Stable.  # Diabetes- BG 148 [Fasting]- recently increased Farxiga 10 mg- [OFF ozempic/metformin- intolerance]-  Stable.  # Hypokalemia: continue Kdur BID- Stable.  # PV access: port functional-   Lupron 02/04/2023  q 31M; Zometa [over 30 mins] q 6 M- #1 10/22/22   # DISPOSITION: # Kadcyla today- # follow up in  3 week- X-MD;  port- labs; cbc/cmp- Kadcyla; Lupron - Dr.B     All questions were answered. The patient/family knows to call the clinic with any problems, questions or concerns.     [name], MD 03/18/2023 2:21 PM

## 2023-03-18 NOTE — Assessment & Plan Note (Addendum)
#   MAY 2023- STAGE III-T4N1 ER/PR positive HER2 POSITIVE RIGHT breast cancer; LN-positive-ER/PR positive her-2 NEGATIVE; # s/p neoadjuvant chemotherapy- TCH plus P x 6 cycles- s/p Right mastectomy-[15th, NOv 2023] ypT2 [77mm]; ypN0.  Given partial response/involvement of the breast skin/multifocal disease- s/p RT- 08/27/2022. atient currently on tamoxifen [DEC-JAN 2024]; + OFS- Lupron every 3 months ]. ON Ado-trastuzumab emtansine  for 14 cycles.   # proceed with Ado-trastuzumab emtansine #13 of planned 14 cycles.  MUGA- June 13th, 2024- Normal LEFT ventricular ejection fraction -58%   stable. Left mammogram- wnl.  Stable.oct 18th, 2024- E cho- pending. Patient will follow in DEC, 2024- re: zometa infusion.   # Mild Thrombocytopenia- >90  monitor for now. Stable.   # Allergic reaction to Ado-trastuzumab emtansine-fever/rash- currently on Claritin x3 days postchemotherapy; Tylenol preemptively.  Decreased Benadryl 25 mg given drowsiness. Stable.    # Hypocalcemia-  ON adjuvant Zometa q 6 months x 3 years [first- May-17th, 2024].   Continue ca+Vit D BID. Vit D FEB 2024- 54; [albumin 3.0]-  Stable.  # Right sided swelling/tightness- s/p  Maureen-  Stable.  # Infusion reaction - ZOmeta [flu like symtoms]- imprived with NSAIDs-  Stable.  # Diabetes- BG 148 [Fasting]- recently increased Farxiga 10 mg- [OFF ozempic/metformin- intolerance]-  Stable.  # Hypokalemia: continue Kdur BID- Stable.  # PV access: port functional-   Lupron 02/04/2023  q 26M; Zometa [over 30 mins] q 6 M- #1 10/22/22   # DISPOSITION: # Kadcyla today- # follow up in  3 week- X-MD;  port- labs; cbc/cmp- Kadcyla; Lupron - Dr.B

## 2023-03-20 NOTE — Therapy (Addendum)
OUTPATIENT PHYSICAL THERAPY SHOULDER TREATMENT   Patient Name: Marilyn Page MRN: [id number] DOB:03/05/1976, 47 y.o., female Today's Date: 03/20/2023  END OF SESSION:  PT End of Session - 03/20/23 2156     Visit Number 10    Number of Visits 17    Date for PT Re-Evaluation 03/28/23    PT Start Time 1545    PT Stop Time 1630    PT Time Calculation (min) 45 min    Activity Tolerance Patient tolerated treatment well    Behavior During Therapy [facility] for tasks assessed/performed               Past Medical History:  Diagnosis Date   Anemia    Carcinoma of upper-outer quadrant of right breast in female, estrogen receptor positive (HCC) 10/26/2021   Diabetes mellitus without complication (HCC)    Family history of adverse reaction to anesthesia    grandmother had issues but doesnt know what the issue was, she was older.   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 Axilla Butterfly hydro clip-MACRO METASTATIC MAMMARY CARCINOMA   BREAST BIOPSY Right 04/21/2022   [place] RT PLC BREAST LOC DEV   1ST LESION  INC US GUIDE 04/21/2022 [facility]   BREAST RECONSTRUCTION WITH PLACEMENT OF TISSUE EXPANDER AND FLEX HD (ACELLULAR HYDRATED DERMIS) Right 04/21/2022   Procedure: RIGHT BREAST RECONSTRUCTION WITH PLACEMENT OF TISSUE EXPANDER AND FLEX HD (ACELLULAR HYDRATED DERMIS);  Surgeon: Peggye Form, DO;  Location: [facility] ORS;  Service: Plastics;  Laterality: Right;   CESAREAN SECTION  2005/2008   DILATION AND CURETTAGE OF UTERUS  2003   PORTACATH PLACEMENT Left 11/09/2021   Procedure: INSERTION PORT-A-CATH;  Surgeon: Earline Mayotte, MD;  Location:  [facility] ORS;  Service: General;  Laterality: Left;   REMOVAL OF TISSUE EXPANDER AND PLACEMENT OF IMPLANT Right 06/08/2022   Procedure: REMOVAL OF TISSUE EXPANDER;  Surgeon: Peggye Form, DO;  Location: [facility] OR;  Service: Plastics;  Laterality: Right;   SIMPLE MASTECTOMY WITH AXILLARY SENTINEL NODE BIOPSY Right 04/21/2022   Procedure: SIMPLE MASTECTOMY WITH AXILLARY SENTINEL NODE BIOPSY;  Surgeon: Earline Mayotte, MD;  Location: [facility] ORS;  Service: General;  Laterality: Right;  wire localization of lymph node   SKIN BIOPSY Right 11/09/2021   Procedure: PUNCH BIOPSY SKIN, TATTOO LYMPH NODE RIGHT AXILLA;  Surgeon: Earline Mayotte, MD;  Location: [facility] ORS;  Service: General;  Laterality: Right;   WISDOM TOOTH EXTRACTION     Patient Active Problem List   Diagnosis Date Noted   Acquired absence of right breast 06/18/2022   Low vitamin B12 level 05/24/2022   Hypomagnesemia 05/16/2022   Lactic acid acidosis 03/06/2022   Fever and chills 03/05/2022   COVID-19 virus infection 12/28/2021   Thrombocytopenia (HCC) 12/28/2021   Diabetes mellitus (HCC) 12/28/2021   Febrile neutropenia (HCC) 12/27/2021   Sepsis (HCC) 12/27/2021   Genetic testing 12/01/2021   Carcinoma of upper-outer quadrant of right breast in female, estrogen receptor positive (HCC) 10/26/2021   Uterus, adenomyosis 05/14/2021   ASCUS of cervix with negative high risk HPV 04/23/2021   Iron deficiency anemia due to chronic blood  loss 04/23/2021   Type 2 diabetes mellitus without complication, without long-term current use of insulin (HCC) 05/18/2020   RLS (restless legs syndrome) 05/14/2020   Menorrhagia with regular cycle 10/14/2017   BMI 37.0-37.9, adult 10/03/2017   History of gestational diabetes 10/03/2017    PCP: [name], NP   REFERRING PROVIDER: [name], MD   REFERRING DIAG:  [phone] (ICD-10-CM) - Stiffness of right shoulder, not elsewhere classified  C50.411,Z17.0 (ICD-10-CM) - Carcinoma  of upper-outer quadrant of right breast in female, estrogen receptor positive    THERAPY DIAG:  Stiffness of right shoulder, not elsewhere classified  Muscle weakness (generalized)  Rationale for Evaluation and Treatment: Rehabilitation  ONSET DATE: 04/2022   SUBJECTIVE:     From Initial evaluation note 01/31/23                                                                                                                                                                                  SUBJECTIVE STATEMENT: Patient has been seeing [name], OT for lymphedema. Patient reports pain and "pulling" on posterior aspect of upper arm. Patient reports difficulty sleeping, reaching overhead, getting dressed (clasping bra), lifting heavy objects.  Patient has pulley system and red theraband. Has been completing stretches 2x/week and pulley/theraband 2-3x/week.  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 Started 6 weeks of radiation 07/2022 and currently in chemotherapy.   Patient referred to PT for R shoulder pain s/p R mastectomy/removal of expanders and radiation. Patient has seen OT for lymphedema and R shoulder ROM ~1 month for maintenance. OT has provided patient with stretches focused on pec and lat muscle tightness (shoulder abduction and flexion in supine with rotation of hips to the L), child's pose, and AAROM shoulder flexion/abduction on the table.    PAIN:  Are you having pain? Yes: NPRS scale: 2/10 - at worst 8/10 Pain location: R shoulder  Pain description: sharp, achy Aggravating factors: reaching, quick movements with arm  Relieving factors: Ice, massage ball   PRECAUTIONS: None  RED FLAGS: None   WEIGHT BEARING RESTRICTIONS: No  FALLS:  Has patient fallen in last 6 months? 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[place]  PLOF: Independent  PATIENT GOALS:to get this to not hurt   NEXT MD VISIT: 02/04/23 - chemo  OBJECTIVE:  DIAGNOSTIC FINDINGS:  N/A  PATIENT SURVEYS:  FOTO 107 with goal of 45  COGNITION: Overall cognitive status: Within functional limits for tasks assessed     SENSATION: WFL  POSTURE: Rounded shoulders, forward head   UPPER EXTREMITY ROM:   Active ROM Right eval Left eval  Shoulder flexion 120 WFL   Shoulder extension    Shoulder abduction 87 WFL   Shoulder adduction    Shoulder internal rotation 35 WFL  Shoulder external rotation 30 WFL   (Blank rows = not tested)  UPPER EXTREMITY MMT:  MMT Right eval Left eval  Shoulder flexion 4+ 4+  Shoulder extension    Shoulder abduction 4* 4+  Shoulder adduction    Shoulder internal rotation 4* 4+  Shoulder external rotation 4* 4+  Middle trapezius    Lower trapezius    Elbow flexion 4+ 5  Elbow extension 4+ 5  (Blank rows = not tested)  JOINT MOBILITY TESTING:  Joint stiffness noted with AP/PA and inferior mobilization   PALPATION:  Significant trigger points and taut muscle bands at pec minor, R UT, and periscapular region.    TODAY'S TREATMENT:                                                                                                                                         DATE: 03/14/23 Subjective: Pt reports she continues to work on her HEP    Pain 0/10 R shoulder at rest  Objective:  Manual Therapy: 30 min Jt mobilizations: R GH distraction, A/P and inf glides Gr III/IV in various positions of flexion, abd (pt in supine hooklying position), seated distraction with IR HBB GR III/IV  STM/TPR R latissimus dorsi, subscap, teres major, teres minor, UT, pec  major/minor/anterior chest wall (in sidelying and supine positions)  Manual pec minor stretch in sidelying  PROM flexion, abd, ER, IR- 5-10x ea  Therapeutic Exercise: 10 min AAROM flexion, abd with dowel x 10 each- not today Towel IR hand behind back stretch 3x 20 seconds with strap Finger ladder: abduction direction 5x with emphasis on relaxing UT substitution- pt notes "tightness" in anterior pec mm region Wall physioball roll into flexion with end range flexion stretch x 10 Doorway pec stretch R 5x 30 seconds Scapular retractions in standing: 2x10 Scapular retraction + rows: 2x10 RTB- not today Seated thoracic extension x10 over chair- not today Standing row BTB 2 x 15- HEP- not today Standing IR with towel roll at elbow BTB 2 x 15- HEP Standing ER with towel roll at elbow RTB 2 x 15- HEP  Pt education for how to perform self TPR with lacrosse ball at wall for teres minor, teres major, and seated using back of chair for latissimus dorsi   PATIENT EDUCATION: Education details: HEP, POC, goals, self TPR techniques Person educated: Patient Education method: Explanation, Demonstration, and Handouts Education comprehension: verbalized understanding  HOME EXERCISE  PROGRAM: Access Code: [email or web address] URL: [email or web address] Date: 02/23/2023 Prepared by: Max Fickle  Exercises - Pec Minor Stretch  - 2-3 x daily - 7 x weekly - 5-10 reps - 30 hold - Corner Pec Major Stretch  - 2-3 x daily - 7 x weekly - 5-10 reps - 30 hold - Supine Shoulder Flexion with Dowel  - 2-3 x daily - 7 x weekly - 10 reps - 3-5 hold - Supine Shoulder Abduction AAROM with Dowel  - 2-3 x daily - 7 x weekly - 10 reps - 3-5 hold - Shoulder Flexion Wall Slide with Towel  - 1 x daily - 7 x weekly - 5 reps - 10-20 hold - Seated Shoulder Abduction Towel Slide at Table Top  - 1 x daily - 7 x weekly - 5 sets - 5 reps - 30 hold - Doorway Pec Stretch at 60 Elevation  - 1 x daily - 7 x weekly - 5  sets - 5 reps - 30 hold - Seated Shoulder External Rotation PROM on Table  - 1 x daily - 7 x weekly - 5 sets - 5 reps - 30 hold - Standing Row with Anchored Resistance  - 1 x daily - 3 x weekly - 2 sets - 10 reps - Shoulder Internal Rotation with Resistance  - 1 x daily - 3 x weekly - 2 sets - 10 reps - Shoulder External Rotation with Anchored Resistance  - 1 x daily - 3 x weekly - 2 sets - 10 reps - Standing Shoulder Internal Rotation Stretch with Towel  - 1 x daily - 7 x weekly - 3 reps - 15 hold  ASSESSMENT:  CLINICAL IMPRESSION:   End feel with GH mobilization inferior and posterior glide is hypomobile.  Standing abduction AAROM limited by tightness/scar tissue in anterior R pec region.  She reports significant tenderness with manual therapy for MTPs in R shoulder musculature.  Continued to focus on soft tissue mob emphasis today to see how she responds to this tx approach.  Overall, making progress with PT.  Patient will benefit from skilled PT interventions to address listed impairments to improve ADL tolerance, pain reduction, and improve pain free R shoulder ROM.   OBJECTIVE IMPAIRMENTS: decreased activity tolerance, decreased ROM, decreased strength, hypomobility, impaired flexibility, impaired UE functional use, and postural dysfunction.   ACTIVITY LIMITATIONS: carrying, lifting, bathing, dressing, reach over head, and hygiene/grooming  PARTICIPATION LIMITATIONS: laundry, shopping, community activity, occupation, and yard work  PERSONAL FACTORS: Past/current experiences, Profession, Time since onset of injury/illness/exacerbation, and 1 comorbidity: breast cancer s/p mastectomy  are also affecting patient's functional outcome.   REHAB POTENTIAL: Good  CLINICAL DECISION MAKING: Stable/uncomplicated  EVALUATION COMPLEXITY: Moderate   GOALS: Goals reviewed with patient? Yes  SHORT TERM GOALS: Target date: 02/28/2023  Patient will be independent in HEP to improve strength/ROM for  better functional independence with ADLs. Baseline: 8/26: HEP initiated  Goal status: INITIAL  LONG TERM GOALS: Target date: 03/29/2023  Patient will increase FOTO score to equal to or greater than 69 to demonstrate statistically significant improvement in mobility and quality of life.  Baseline: 8/26: 54 Goal status: INITIAL  2.  Patient will demonstrate adequate R shoulder ROM and strength to be able to clasp bra, dress independently, and perform overhead activities with pain less than 2/10.  Baseline: 8/26: unable to clasp bra with IR limitation  Goal status: INITIAL  3.  Patient will improve B UE strength by 1/2 MMT grade to improve ability  to complete iADLs and job related tasks.  Baseline: 8/26: see above Goal status: INITIAL  4.  Patient will report a worst pain of 2/10 on NRPS in R shoulder to improve tolerance with ADLs and reduced symptoms with activities.  Baseline: 8/26: worst pain 8/10 Goal status: INITIAL  PLAN:  PT FREQUENCY: 1-2x/week  PT DURATION: 8 weeks  PLANNED INTERVENTIONS: Therapeutic exercises, Therapeutic activity, Patient/Family education, Self Care, Joint mobilization, Joint manipulation, DME instructions, Dry Needling, Spinal mobilization, Cryotherapy, Moist heat, Taping, Traction, and Manual therapy  PLAN FOR NEXT SESSION: joint mobilizations, scapulothoracic mob, continue ROM for shoulder ROM deficits; scapular retractions; continue manual therapy techniques STM/TPR; discussed asking her oncology team if intraarticular injections would be an appropriate tx option to consider or if this would be contraindicated with her current oncology tx plan   Max Fickle, PT, DPT, OCS  Physical Therapist - [facility]  03/20/2023, 10:15 PM

## 2023-03-21 ENCOUNTER — Ambulatory Visit: Payer: 59

## 2023-03-21 DIAGNOSIS — M6281 Muscle weakness (generalized): Secondary | ICD-10-CM

## 2023-03-21 DIAGNOSIS — M25611 Stiffness of right shoulder, not elsewhere classified: Secondary | ICD-10-CM | POA: Diagnosis not present

## 2023-03-21 NOTE — Therapy (Signed)
OUTPATIENT PHYSICAL THERAPY SHOULDER TREATMENT   Patient Name: Marilyn Page MRN: [id number] DOB:11/12/1975, 47 y.o., female Today's Date: 03/21/2023  END OF SESSION:  PT End of Session - 03/21/23 1545     Visit Number 11    Number of Visits 17    Date for PT Re-Evaluation 03/28/23    PT Start Time 1545    PT Stop Time 1630    PT Time Calculation (min) 45 min    Activity Tolerance Patient tolerated treatment well    Behavior During Therapy [facility] for tasks assessed/performed               Past Medical History:  Diagnosis Date   Anemia    Carcinoma of upper-outer quadrant of right breast in female, estrogen receptor positive (HCC) 10/26/2021   Diabetes mellitus without complication (HCC)    Family history of adverse reaction to anesthesia    grandmother had issues but doesnt know what the issue was, she was older.   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 Axilla Butterfly hydro clip-MACRO METASTATIC MAMMARY CARCINOMA   BREAST BIOPSY Right 04/21/2022   [place] RT PLC BREAST LOC DEV   1ST LESION  INC US GUIDE 04/21/2022 [facility]   BREAST RECONSTRUCTION WITH PLACEMENT OF TISSUE EXPANDER AND FLEX HD (ACELLULAR HYDRATED DERMIS) Right 04/21/2022   Procedure: RIGHT BREAST RECONSTRUCTION WITH PLACEMENT OF TISSUE EXPANDER AND FLEX HD (ACELLULAR HYDRATED DERMIS);  Surgeon: Peggye Form, DO;  Location: [facility] ORS;  Service: Plastics;  Laterality: Right;   CESAREAN SECTION  2005/2008   DILATION AND CURETTAGE OF UTERUS  2003   PORTACATH PLACEMENT Left 11/09/2021   Procedure: INSERTION PORT-A-CATH;  Surgeon: Earline Mayotte, MD;  Location:  [facility] ORS;  Service: General;  Laterality: Left;   REMOVAL OF TISSUE EXPANDER AND PLACEMENT OF IMPLANT Right 06/08/2022   Procedure: REMOVAL OF TISSUE EXPANDER;  Surgeon: Peggye Form, DO;  Location: [facility] OR;  Service: Plastics;  Laterality: Right;   SIMPLE MASTECTOMY WITH AXILLARY SENTINEL NODE BIOPSY Right 04/21/2022   Procedure: SIMPLE MASTECTOMY WITH AXILLARY SENTINEL NODE BIOPSY;  Surgeon: Earline Mayotte, MD;  Location: [facility] ORS;  Service: General;  Laterality: Right;  wire localization of lymph node   SKIN BIOPSY Right 11/09/2021   Procedure: PUNCH BIOPSY SKIN, TATTOO LYMPH NODE RIGHT AXILLA;  Surgeon: Earline Mayotte, MD;  Location: [facility] ORS;  Service: General;  Laterality: Right;   WISDOM TOOTH EXTRACTION     Patient Active Problem List   Diagnosis Date Noted   Acquired absence of right breast 06/18/2022   Low vitamin B12 level 05/24/2022   Hypomagnesemia 05/16/2022   Lactic acid acidosis 03/06/2022   Fever and chills 03/05/2022   COVID-19 virus infection 12/28/2021   Thrombocytopenia (HCC) 12/28/2021   Diabetes mellitus (HCC) 12/28/2021   Febrile neutropenia (HCC) 12/27/2021   Sepsis (HCC) 12/27/2021   Genetic testing 12/01/2021   Carcinoma of upper-outer quadrant of right breast in female, estrogen receptor positive (HCC) 10/26/2021   Uterus, adenomyosis 05/14/2021   ASCUS of cervix with negative high risk HPV 04/23/2021   Iron deficiency anemia due to chronic blood  loss 04/23/2021   Type 2 diabetes mellitus without complication, without long-term current use of insulin (HCC) 05/18/2020   RLS (restless legs syndrome) 05/14/2020   Menorrhagia with regular cycle 10/14/2017   BMI 37.0-37.9, adult 10/03/2017   History of gestational diabetes 10/03/2017    PCP: [name], NP   REFERRING PROVIDER: [name], MD   REFERRING DIAG:  2245822049 (ICD-10-CM) - Stiffness of right shoulder, not elsewhere classified  C50.411,Z17.0 (ICD-10-CM) - Carcinoma  of upper-outer quadrant of right breast in female, estrogen receptor positive    THERAPY DIAG:  Stiffness of right shoulder, not elsewhere classified  Muscle weakness (generalized)  Rationale for Evaluation and Treatment: Rehabilitation  ONSET DATE: 04/2022   SUBJECTIVE:     From Initial evaluation note 01/31/23                                                                                                                                                                                  SUBJECTIVE STATEMENT: Patient has been seeing [name], OT for lymphedema. Patient reports pain and "pulling" on posterior aspect of upper arm. Patient reports difficulty sleeping, reaching overhead, getting dressed (clasping bra), lifting heavy objects.  Patient has pulley system and red theraband. Has been completing stretches 2x/week and pulley/theraband 2-3x/week.  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 Started 6 weeks of radiation 07/2022 and currently in chemotherapy.   Patient referred to PT for R shoulder pain s/p R mastectomy/removal of expanders and radiation. Patient has seen OT for lymphedema and R shoulder ROM ~1 month for maintenance. OT has provided patient with stretches focused on pec and lat muscle tightness (shoulder abduction and flexion in supine with rotation of hips to the L), child's pose, and AAROM shoulder flexion/abduction on the table.    PAIN:  Are you having pain? Yes: NPRS scale: 2/10 - at worst 8/10 Pain location: R shoulder  Pain description: sharp, achy Aggravating factors: reaching, quick movements with arm  Relieving factors: Ice, massage ball   PRECAUTIONS: None  RED FLAGS: None   WEIGHT BEARING RESTRICTIONS: No  FALLS:  Has patient fallen in last 6 months? 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[place]  PLOF: Independent  PATIENT GOALS:to get this to not hurt   NEXT MD VISIT: 02/04/23 - chemo  OBJECTIVE:  DIAGNOSTIC FINDINGS:  N/A  PATIENT SURVEYS:  FOTO 26 with goal of 38  COGNITION: Overall cognitive status: Within functional limits for tasks assessed     SENSATION: WFL  POSTURE: Rounded shoulders, forward head   UPPER EXTREMITY ROM:   Active ROM Right eval Left eval  Shoulder flexion 120 WFL   Shoulder extension    Shoulder abduction 87 WFL   Shoulder adduction    Shoulder internal rotation 35 WFL  Shoulder external rotation 30 WFL   (Blank rows = not tested)  UPPER EXTREMITY MMT:  MMT Right eval Left eval  Shoulder flexion 4+ 4+  Shoulder extension    Shoulder abduction 4* 4+  Shoulder adduction    Shoulder internal rotation 4* 4+  Shoulder external rotation 4* 4+  Middle trapezius    Lower trapezius    Elbow flexion 4+ 5  Elbow extension 4+ 5  (Blank rows = not tested)  JOINT MOBILITY TESTING:  Joint stiffness noted with AP/PA and inferior mobilization   PALPATION:  Significant trigger points and taut muscle bands at pec minor, R UT, and periscapular region.    TODAY'S TREATMENT:                                                                                                                                         DATE: 03/21/23 Subjective: Pt had her chemotherapy on Friday.  She felt better with the type of manual therapy last session.      Pain 0/10 R shoulder at rest  Objective:  Manual Therapy: 30 min Jt mobilizations: R GH distraction, A/P and inf glides Gr III/IV in various positions of flexion, abd (pt in supine hooklying position), seated distraction with IR HBB GR III/IV  STM/TPR R latissimus  dorsi, subscap, teres major, teres minor, UT, pec major/minor/anterior chest wall (in sidelying and supine positions)  Manual pec minor stretch in sidelying  PROM flexion, abd, ER, IR- 5-10x ea  Therapeutic Exercise: 10 min AAROM flexion, abd with dowel x 10 each- not today Towel IR hand behind back stretch 3x 20 seconds with strap Finger ladder: abduction direction 5x with emphasis on relaxing UT substitution- pt notes "tightness" in anterior pec mm region Wall physioball roll into flexion with end range flexion stretch x 10 Doorway pec stretch R 5x 30 seconds Scapular retractions in standing: 2x10 Scapular retraction + rows: 2x10 RTB- not today Seated thoracic extension x10 over chair- not today Standing row BTB 2 x 15- HEP- not today Standing IR with towel roll at elbow BTB 2 x 15- HEP Standing ER with towel roll at elbow RTB 2 x 15- HEP Standing shoulder AROM- flexion, abd, IR, ER after manual therapy x10 ea  Pt education for how to perform self TPR with lacrosse ball at wall for teres minor, teres major, and seated using back of chair for latissimus dorsi   PATIENT EDUCATION: Education  details: HEP, POC, goals, self TPR techniques Person educated: Patient Education method: Explanation, Demonstration, and Handouts Education comprehension: verbalized understanding  HOME EXERCISE PROGRAM: Access Code: [email or web address] URL: [email or web address] Date: 02/23/2023 Prepared by: Max Fickle  Exercises - Pec Minor Stretch  - 2-3 x daily - 7 x weekly - 5-10 reps - 30 hold - Corner Pec Major Stretch  - 2-3 x daily - 7 x weekly - 5-10 reps - 30 hold - Supine Shoulder Flexion with Dowel  - 2-3 x daily - 7 x weekly - 10 reps - 3-5 hold - Supine Shoulder Abduction AAROM with Dowel  - 2-3 x daily - 7 x weekly - 10 reps - 3-5 hold - Shoulder Flexion Wall Slide with Towel  - 1 x daily - 7 x weekly - 5 reps - 10-20 hold - Seated Shoulder Abduction Towel Slide at Table Top  -  1 x daily - 7 x weekly - 5 sets - 5 reps - 30 hold - Doorway Pec Stretch at 60 Elevation  - 1 x daily - 7 x weekly - 5 sets - 5 reps - 30 hold - Seated Shoulder External Rotation PROM on Table  - 1 x daily - 7 x weekly - 5 sets - 5 reps - 30 hold - Standing Row with Anchored Resistance  - 1 x daily - 3 x weekly - 2 sets - 10 reps - Shoulder Internal Rotation with Resistance  - 1 x daily - 3 x weekly - 2 sets - 10 reps - Shoulder External Rotation with Anchored Resistance  - 1 x daily - 3 x weekly - 2 sets - 10 reps - Standing Shoulder Internal Rotation Stretch with Towel  - 1 x daily - 7 x weekly - 3 reps - 15 hold  ASSESSMENT:  CLINICAL IMPRESSION:   Continued to focus on soft tissue mob emphasis today to see how she responds to this tx approach.  Pt a little more fatigued during session due to recent chemotherapy on Friday.  Overall, making progress with PT.  Patient will benefit from skilled PT interventions to address listed impairments to improve ADL tolerance, pain reduction, and improve pain free R shoulder ROM.   OBJECTIVE IMPAIRMENTS: decreased activity tolerance, decreased ROM, decreased strength, hypomobility, impaired flexibility, impaired UE functional use, and postural dysfunction.   ACTIVITY LIMITATIONS: carrying, lifting, bathing, dressing, reach over head, and hygiene/grooming  PARTICIPATION LIMITATIONS: laundry, shopping, community activity, occupation, and yard work  PERSONAL FACTORS: Past/current experiences, Profession, Time since onset of injury/illness/exacerbation, and 1 comorbidity: breast cancer s/p mastectomy  are also affecting patient's functional outcome.   REHAB POTENTIAL: Good  CLINICAL DECISION MAKING: Stable/uncomplicated  EVALUATION COMPLEXITY: Moderate   GOALS: Goals reviewed with patient? Yes  SHORT TERM GOALS: Target date: 02/28/2023  Patient will be independent in HEP to improve strength/ROM for better functional independence with  ADLs. Baseline: 8/26: HEP initiated  Goal status: INITIAL  LONG TERM GOALS: Target date: 03/29/2023  Patient will increase FOTO score to equal to or greater than 69 to demonstrate statistically significant improvement in mobility and quality of life.  Baseline: 8/26: 54 Goal status: INITIAL  2.  Patient will demonstrate adequate R shoulder ROM and strength to be able to clasp bra, dress independently, and perform overhead activities with pain less than 2/10.  Baseline: 8/26: unable to clasp bra with IR limitation  Goal status: INITIAL  3.  Patient will improve B UE strength by 1/2 MMT grade to improve ability to complete  iADLs and job related tasks.  Baseline: 8/26: see above Goal status: INITIAL  4.  Patient will report a worst pain of 2/10 on NRPS in R shoulder to improve tolerance with ADLs and reduced symptoms with activities.  Baseline: 8/26: worst pain 8/10 Goal status: INITIAL  PLAN:  PT FREQUENCY: 1-2x/week  PT DURATION: 8 weeks  PLANNED INTERVENTIONS: Therapeutic exercises, Therapeutic activity, Patient/Family education, Self Care, Joint mobilization, Joint manipulation, DME instructions, Dry Needling, Spinal mobilization, Cryotherapy, Moist heat, Taping, Traction, and Manual therapy  PLAN FOR NEXT SESSION: joint mobilizations, scapulothoracic mob, continue ROM for shoulder ROM deficits; scapular retractions; continue manual therapy techniques STM/TPR; discussed asking her oncology team if intraarticular injections would be an appropriate tx option to consider or if this would be contraindicated with her current oncology tx plan   Max Fickle, PT, DPT, OCS  Physical Therapist - [facility]  03/21/2023, 3:45 PM

## 2023-03-22 ENCOUNTER — Encounter: Payer: Self-pay | Admitting: Internal Medicine

## 2023-03-23 ENCOUNTER — Ambulatory Visit: Payer: 59

## 2023-03-23 DIAGNOSIS — M6281 Muscle weakness (generalized): Secondary | ICD-10-CM

## 2023-03-23 DIAGNOSIS — M25611 Stiffness of right shoulder, not elsewhere classified: Secondary | ICD-10-CM | POA: Diagnosis not present

## 2023-03-24 ENCOUNTER — Inpatient Hospital Stay: Payer: 59 | Admitting: Hospice and Palliative Medicine

## 2023-03-24 DIAGNOSIS — J069 Acute upper respiratory infection, unspecified: Secondary | ICD-10-CM

## 2023-03-24 DIAGNOSIS — C50411 Malignant neoplasm of upper-outer quadrant of right female breast: Secondary | ICD-10-CM

## 2023-03-24 DIAGNOSIS — Z170 Estrogen receptor positive status [ER+]: Secondary | ICD-10-CM

## 2023-03-24 MED ORDER — AZITHROMYCIN 250 MG PO TABS: ORAL_TABLET | ORAL | 0 refills | Status: DC

## 2023-03-24 NOTE — Progress Notes (Signed)
Virtual Visit via Video Note  I connected with Marilyn Page on 03/24/23 at  2:30 PM EDT by a video enabled telemedicine application and verified that I am speaking with the correct person using two identifiers.  Location: Patient: Home Provider: Clinic   I discussed the limitations of evaluation and management by telemedicine and the availability of in person appointments. The patient expressed understanding and agreed to proceed.  History of Present Illness: Marilyn Page is a 47 year old woman with stage III ER/PR positive, HER2 positive right breast cancer status post right mastectomy.  Currently on Kadclya.    Observations/Objective: Virtual visit with patient today.  She says that she has 4 days of stuffy nose, sore throat, swollen lymph nodes on neck, and mildly productive cough.  Denies fever or chills.  No shortness of breath or chest pain.  No facial pain or tenderness.  Denies GI or GU symptoms.  Has been taking over-the-counter cough meds.  Took a home COVID test today which was negative.  No known sick contacts.  She did have cycle 13 Kadcyla on 03/18/2023.  Assessment and Plan: Breast cancer -on active treatment with Kadcyla   URI -Suspect viral etiology given symptomatology.  However, patient at higher risk due to active cancer treatment.  Will empirically cover with azithromycin.  Discussed symptomatic care.    Follow Up Instructions: Patient will keep [place] informed and can follow-up with clinic visit if needed.   I discussed the assessment and treatment plan with the patient. The patient was provided an opportunity to ask questions and all were answered. The patient agreed with the plan and demonstrated an understanding of the instructions.   The patient was advised to call back or seek an in-person evaluation if the symptoms worsen or if the condition fails to improve as anticipated.  I provided 15 minutes of non-face-to-face time during this encounter.   Malachy Moan, NP

## 2023-03-25 ENCOUNTER — Ambulatory Visit
Admission: RE | Admit: 2023-03-25 | Discharge: 2023-03-25 | Disposition: A | Discharge status: Home / Self Care | Payer: 59 | Source: Ambulatory Visit | Attending: Internal Medicine | Admitting: Internal Medicine

## 2023-03-25 DIAGNOSIS — Z9221 Personal history of antineoplastic chemotherapy: Secondary | ICD-10-CM | POA: Diagnosis not present

## 2023-03-25 DIAGNOSIS — Z853 Personal history of malignant neoplasm of breast: Secondary | ICD-10-CM | POA: Diagnosis not present

## 2023-03-25 DIAGNOSIS — Z0189 Encounter for other specified special examinations: Secondary | ICD-10-CM

## 2023-03-25 DIAGNOSIS — E119 Type 2 diabetes mellitus without complications: Secondary | ICD-10-CM | POA: Insufficient documentation

## 2023-03-25 LAB — ECHOCARDIOGRAM COMPLETE
AR max vel: 2.56 cm2
AV Area VTI: 3.1 cm2
AV Area mean vel: 2.56 cm2
AV Mean grad: 3 mm[Hg]
AV Peak grad: 6.2 mm[Hg]
Ao pk vel: 1.24 m/s
Area-P 1/2: 4.44 cm2
MV VTI: 3.61 cm2
S' Lateral: 3.1 cm

## 2023-03-25 NOTE — Progress Notes (Signed)
*  PRELIMINARY RESULTS* Echocardiogram 2D Echocardiogram has been performed.  Cristela Blue 03/25/2023, 11:47 AM

## 2023-03-28 NOTE — Therapy (Signed)
OUTPATIENT PHYSICAL THERAPY SHOULDER TREATMENT 03/23/23- Progress Note and Re-certification through 05/18/23   Patient Name: Marilyn Page MRN: [id number] DOB:September 01, 1975, 47 y.o., female Today's Date: 03/28/2023  END OF SESSION:  PT End of Session - 03/28/23 0908     Visit Number 12    Number of Visits 20    Date for PT Re-Evaluation 05/18/23    Authorization Type 1x/week x 8 weeks: re-cert through 05/18/23    Authorization Time Period 30 v /calendar year PT/OT/Speech    PT Start Time 1420    PT Stop Time 1505    PT Time Calculation (min) 45 min    Activity Tolerance Patient tolerated treatment well    Behavior During Therapy [facility] for tasks assessed/performed                Past Medical History:  Diagnosis Date   Anemia    Carcinoma of upper-outer quadrant of right breast in female, estrogen receptor positive (HCC) 10/26/2021   Diabetes mellitus without complication (HCC)    Family history of adverse reaction to anesthesia    grandmother had issues but doesnt know what the issue was, she was older.   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 Axilla Butterfly hydro clip-MACRO METASTATIC MAMMARY CARCINOMA   BREAST BIOPSY Right 04/21/2022   [place] RT PLC BREAST LOC DEV   1ST LESION  INC US GUIDE 04/21/2022 [facility]   BREAST RECONSTRUCTION WITH PLACEMENT OF TISSUE EXPANDER AND FLEX HD (ACELLULAR HYDRATED DERMIS) Right 04/21/2022   Procedure: RIGHT BREAST RECONSTRUCTION WITH PLACEMENT OF TISSUE EXPANDER AND FLEX HD (ACELLULAR HYDRATED DERMIS);  Surgeon: Peggye Form, DO;  Location: [facility] ORS;  Service: Plastics;  Laterality: Right;    CESAREAN SECTION  2005/2008   DILATION AND CURETTAGE OF UTERUS  2003   PORTACATH PLACEMENT Left 11/09/2021   Procedure: INSERTION PORT-A-CATH;  Surgeon: Earline Mayotte, MD;  Location: [facility] OR[facility];  Service: General;  Laterality: Left;   REMOVAL OF TISSUE EXPANDER AND PLACEMENT OF IMPLANT Right 06/08/2022   Procedure: REMOVAL OF TISSUE EXPANDER;  Surgeon: Peggye Form, DO;  Location: [facility];  Service: Plastics;  Laterality: Right;   SIMPLE MASTECTOMY WITH AXILLARY SENTINEL NODE BIOPSY Right 04/21/2022   Procedure: SIMPLE MASTECTOMY WITH AXILLARY SENTINEL NODE BIOPSY;  Surgeon: Earline Mayotte, MD;  Location: [facility];  Service: General;  Laterality: Right;  wire localization of lymph node   SKIN BIOPSY Right 11/09/2021   Procedure: PUNCH BIOPSY SKIN, TATTOO LYMPH NODE RIGHT AXILLA;  Surgeon: Earline Mayotte, MD;  Location: [facility] OR[facility];  Service: General;  Laterality: Right;   WISDOM TOOTH EXTRACTION     Patient Active Problem List   Diagnosis Date Noted   Acquired absence of right breast 06/18/2022   Low vitamin B12 level 05/24/2022   Hypomagnesemia 05/16/2022   Lactic acid acidosis 03/06/2022   Fever and chills 03/05/2022   COVID-19 virus infection 12/28/2021   Thrombocytopenia (HCC) 12/28/2021   Diabetes mellitus (HCC) 12/28/2021   Febrile neutropenia (HCC) 12/27/2021   Sepsis (HCC) 12/27/2021   Genetic testing 12/01/2021   Carcinoma of upper-outer quadrant of right breast in  female, estrogen receptor positive (HCC) 10/26/2021   Uterus, adenomyosis 05/14/2021   ASCUS of cervix with negative high risk HPV 04/23/2021   Iron deficiency anemia due to chronic blood loss 04/23/2021   Type 2 diabetes mellitus without complication, without long-term current use of insulin (HCC) 05/18/2020   RLS (restless legs syndrome) 05/14/2020   Menorrhagia with regular cycle 10/14/2017   BMI 37.0-37.9, adult 10/03/2017   History of gestational diabetes 10/03/2017    PCP: [name], NP   REFERRING PROVIDER: [name], MD   REFERRING DIAG:  M25.611 (ICD-10-CM) - Stiffness of right shoulder, not elsewhere classified  C50.411,Z17.0 (ICD-10-CM) - Carcinoma of upper-outer quadrant of right breast in female, estrogen receptor positive    THERAPY DIAG:  Stiffness of right shoulder, not elsewhere classified  Muscle weakness (generalized)  Rationale for Evaluation and Treatment: Rehabilitation  ONSET DATE: 04/2022   SUBJECTIVE:     From Initial evaluation note 01/31/23                                                                                                                                                                                  SUBJECTIVE STATEMENT: Patient has been seeing [name], OT for lymphedema. Patient reports pain and "pulling" on posterior aspect of upper arm. Patient reports difficulty sleeping, reaching overhead, getting dressed (clasping bra), lifting heavy objects.  Patient has pulley system and red theraband. Has been completing stretches 2x/week and pulley/theraband 2-3x/week.  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 Started 6 weeks of radiation 07/2022 and currently in chemotherapy.   Patient referred to PT for R shoulder pain s/p R mastectomy/removal of expanders and radiation. Patient has seen OT for lymphedema and R shoulder ROM ~1 month for maintenance. OT has provided patient with stretches focused on pec and lat muscle tightness (shoulder abduction and flexion in supine with rotation of hips to the L), child's pose, and AAROM shoulder flexion/abduction on the table.    PAIN:  Are you having pain? Yes: NPRS scale: 2/10 - at worst 8/10 Pain location: R shoulder  Pain description: sharp, achy Aggravating factors: reaching, quick movements with arm  Relieving factors: Ice, massage ball    PRECAUTIONS: None  RED FLAGS: None   WEIGHT BEARING RESTRICTIONS: No  FALLS:  Has patient fallen in last 6 months? 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 [place]  PLOF: Independent  PATIENT GOALS:to get this to not hurt   NEXT MD VISIT: 02/04/23 - chemo  OBJECTIVE:   DIAGNOSTIC FINDINGS:  N/A  PATIENT SURVEYS:  FOTO 40 with goal of 39  COGNITION: Overall cognitive status: Within functional limits for tasks assessed     SENSATION: WFL  POSTURE: Rounded shoulders, forward head   UPPER EXTREMITY ROM:   Active ROM Right eval Left eval  Shoulder flexion 120 WFL   Shoulder extension    Shoulder abduction 87 WFL   Shoulder adduction    Shoulder internal rotation 35 WFL  Shoulder external rotation 30 WFL   (Blank rows = not tested)  UPPER EXTREMITY MMT:  MMT Right eval Left eval  Shoulder flexion 4+ 4+  Shoulder extension    Shoulder abduction 4* 4+  Shoulder adduction    Shoulder internal rotation 4* 4+  Shoulder external rotation 4* 4+  Middle trapezius    Lower trapezius    Elbow flexion 4+ 5  Elbow extension 4+ 5  (Blank rows = not tested)  JOINT MOBILITY TESTING:  Joint stiffness noted with AP/PA and inferior mobilization   PALPATION:  Significant trigger points and taut muscle bands at pec minor, R UT, and periscapular region.    TODAY'S TREATMENT:                                                                                                                                         DATE: 03/23/23 Subjective: Pt reports overall she feels like PT is helping; she notices some improvement in being able to reach and use her arm at shoulder height and with some small activities above shoulder  height; reaching behind her back is still limited and painful but she is noticing improvement there too.  Has 1 more chemo tx at the beginning of November.    Pain 0/10 R shoulder at rest  Objective:  Manual Therapy: 30 min Jt mobilizations: R GH distraction, A/P and inf glides Gr III/IV in various positions of flexion, abd (pt in supine hooklying position), seated distraction with IR HBB GR III/IV  STM/TPR R latissimus dorsi, subscap, teres major, teres minor, UT, pec major/minor/anterior chest wall (in sidelying and supine positions)  Manual pec minor stretch in sidelying  PROM flexion, abd, ER, IR- 5-10x ea  UPPER EXTREMITY ROM:   Active ROM Right eval Left eval Right 03/23/23  Shoulder flexion 120 WFL  140 supine 130 standing  Shoulder extension     Shoulder abduction 87 WFL  120 supine AAROM 100 standing   Shoulder adduction     Shoulder internal rotation 35 WFL HBB thumb to L4  Shoulder external rotation 30 WFL  55  (Blank rows = not tested)  Therapeutic Exercise: 10 min AAROM flexion, abd with dowel x 10 each- not today Towel IR hand  behind back stretch 3x 20 seconds with strap- reviewed Finger ladder: abduction direction 5x with emphasis on relaxing UT substitution- pt notes "tightness" in anterior pec mm region- not today Wall physioball roll into flexion with end range flexion stretch x 10 Doorway pec stretch R 5x 30 seconds Scapular retractions in standing: 2x10 Scapular retraction + rows: 2x10 RTB- not today Seated thoracic extension x10 over chair- not today Standing row BTB 2 x 15- HEP- not today Standing IR with towel roll at elbow BTB 2 x 15- HEP Standing ER with towel roll at elbow RTB 2 x 15- HEP Standing shoulder AROM- flexion, abd, IR, ER after manual therapy x10 ea  Pt education for how to perform self TPR with lacrosse ball at wall for teres minor, teres major, and seated using back of chair for latissimus dorsi   PATIENT  EDUCATION: Education details: HEP, POC, goals, self TPR techniques Person educated: Patient Education method: Explanation, Demonstration, and Handouts Education comprehension: verbalized understanding  HOME EXERCISE PROGRAM: Access Code: [email or web address] URL: [email or web address] Date: 02/23/2023 Prepared by: Max Fickle  Exercises - Pec Minor Stretch  - 2-3 x daily - 7 x weekly - 5-10 reps - 30 hold - Corner Pec Major Stretch  - 2-3 x daily - 7 x weekly - 5-10 reps - 30 hold - Supine Shoulder Flexion with Dowel  - 2-3 x daily - 7 x weekly - 10 reps - 3-5 hold - Supine Shoulder Abduction AAROM with Dowel  - 2-3 x daily - 7 x weekly - 10 reps - 3-5 hold - Shoulder Flexion Wall Slide with Towel  - 1 x daily - 7 x weekly - 5 reps - 10-20 hold - Seated Shoulder Abduction Towel Slide at Table Top  - 1 x daily - 7 x weekly - 5 sets - 5 reps - 30 hold - Doorway Pec Stretch at 60 Elevation  - 1 x daily - 7 x weekly - 5 sets - 5 reps - 30 hold - Seated Shoulder External Rotation PROM on Table  - 1 x daily - 7 x weekly - 5 sets - 5 reps - 30 hold - Standing Row with Anchored Resistance  - 1 x daily - 3 x weekly - 2 sets - 10 reps - Shoulder Internal Rotation with Resistance  - 1 x daily - 3 x weekly - 2 sets - 10 reps - Shoulder External Rotation with Anchored Resistance  - 1 x daily - 3 x weekly - 2 sets - 10 reps - Standing Shoulder Internal Rotation Stretch with Towel  - 1 x daily - 7 x weekly - 3 reps - 15 hold  ASSESSMENT:  CLINICAL IMPRESSION:   Continued to focus on soft tissue mob emphasis today to see how she responds to this tx approach.  Pt a little more fatigued during session due to recent chemotherapy on Friday.  Overall, making progress with PT.  Patient will benefit from skilled PT interventions to address listed impairments to improve ADL tolerance, pain reduction, and improve pain free R shoulder ROM.   OBJECTIVE IMPAIRMENTS: decreased activity tolerance, decreased  ROM, decreased strength, hypomobility, impaired flexibility, impaired UE functional use, and postural dysfunction.   ACTIVITY LIMITATIONS: carrying, lifting, bathing, dressing, reach over head, and hygiene/grooming  PARTICIPATION LIMITATIONS: laundry, shopping, community activity, occupation, and yard work  PERSONAL FACTORS: Past/current experiences, Profession, Time since onset of injury/illness/exacerbation, and 1 comorbidity: breast cancer s/p mastectomy  are also affecting patient's functional outcome.   REHAB POTENTIAL:  Good  CLINICAL DECISION MAKING: Stable/uncomplicated  EVALUATION COMPLEXITY: Moderate   GOALS: Goals reviewed with patient? Yes  SHORT TERM GOALS: Target date: 04/08/2023  Patient will be independent in HEP to improve strength/ROM for better functional independence with ADLs. Baseline: 8/26: HEP initiated ; 03/23/23: in progress, adjusting and progressing HEP as appropriate Goal status: In Progress  LONG TERM GOALS: Target date: 12/11//2024  Patient will increase FOTO score to equal to or greater than 69 to demonstrate statistically significant improvement in mobility and quality of life.  Baseline: 8/26: 54 Goal status: In Progress  2.  Patient will demonstrate adequate R shoulder ROM and strength to be able to clasp bra, dress independently, and perform overhead activities with pain less than 2/10.  Baseline: 8/26: unable to clasp bra with IR limitation; 03/23/23: in progress, able to reach thumb to L4 R; able to reach hair but not lift item >7 lbs off high shelf with R UE only Goal status: In progress  3.  Patient will improve B UE strength by 1/2 MMT grade to improve ability to complete iADLs and job related tasks.  Baseline: 8/26: see above; 03/23/23: in progress Goal status: In progress  4.  Patient will report a worst pain of 2/10 on NRPS in R shoulder to improve tolerance with ADLs and reduced symptoms with activities.  Baseline: 8/26: worst pain  8/10; 03/23/23: worst pain 4/10 Goal status: In progres  PLAN:  PT FREQUENCY: 1-2x/week  PT DURATION: 8 weeks  PLANNED INTERVENTIONS: Therapeutic exercises, Therapeutic activity, Patient/Family education, Self Care, Joint mobilization, Joint manipulation, DME instructions, Dry Needling, Spinal mobilization, Cryotherapy, Moist heat, Taping, Traction, and Manual therapy  PLAN FOR NEXT SESSION: joint mobilizations, scapulothoracic mob, continue ROM for shoulder ROM deficits; scapular retractions; continue manual therapy techniques STM/TPR; discussed asking her oncology team if intraarticular injections would be an appropriate tx option to consider or if this would be contraindicated with her current oncology tx plan   Max Fickle, PT, DPT, OCS  Physical Therapist - [facility]  03/28/2023, 9:11 AM

## 2023-04-06 ENCOUNTER — Encounter: Payer: Self-pay | Admitting: Radiation Oncology

## 2023-04-06 ENCOUNTER — Ambulatory Visit
Admission: RE | Admit: 2023-04-06 | Discharge: 2023-04-06 | Disposition: A | Discharge status: Home / Self Care | Payer: 59 | Source: Ambulatory Visit | Attending: Radiation Oncology | Admitting: Radiation Oncology

## 2023-04-06 VITALS — BP 121/77 | HR 75 | Temp 97.8°F | Resp 16 | Wt 208.0 lb

## 2023-04-06 DIAGNOSIS — Z923 Personal history of irradiation: Secondary | ICD-10-CM | POA: Diagnosis not present

## 2023-04-06 DIAGNOSIS — Z79811 Long term (current) use of aromatase inhibitors: Secondary | ICD-10-CM | POA: Insufficient documentation

## 2023-04-06 DIAGNOSIS — Z170 Estrogen receptor positive status [ER+]: Secondary | ICD-10-CM | POA: Insufficient documentation

## 2023-04-06 DIAGNOSIS — C50411 Malignant neoplasm of upper-outer quadrant of right female breast: Secondary | ICD-10-CM | POA: Diagnosis present

## 2023-04-06 NOTE — Progress Notes (Signed)
Radiation Oncology Follow up Note  Name: Marilyn Page   Date:   04/06/2023 MRN:  [id number] DOB: [birth year]    This 47 y.o. female presents to the clinic today for 41-month follow-up status post radiation therapy to her right chest wall and peripheral lymphatics for 2A triple positive invasive mammary carcinoma status post neoadjuvant chemotherapy followed by modified radical mastectomy  REFERRING PROVIDER: [name], NP  HPI: Patient is a 47 year old female now out 7 months having completed right chest wall and peripheral lymphatic radiation therapy for stage IIa triple positive invasive mammary carcinoma seen today in routine follow-up she is doing well she specifically denies chest wall pain or discomfort.  She is still being treated.  Withadjuvant kadcyla / tamoxifen + OFS.  She was recently treated with a Z-Pak for possible URI.  She is having physical therapy for scar tissue secondary to her 2 surgeries.  COMPLICATIONS OF TREATMENT: none  FOLLOW UP COMPLIANCE: keeps appointments   PHYSICAL EXAM:  BP 121/77 (BP Location: Left Arm, Patient Position: Sitting)   Pulse 75   Temp 97.8 F (36.6 C) (Tympanic)   Resp 16   Wt 208 lb (94.3 kg)   BMI 33.57 kg/m  Right chest wall is free of mass or nodularity.  Left breast is free of dominant mass.  No axillary or supraclavicular adenopathy is identified.  No evidence of lymphedema in her right upper extremity is noted.  Well-developed well-nourished patient in NAD. HEENT reveals PERLA, EOMI, discs not visualized.  Oral cavity is clear. No oral mucosal lesions are identified. Neck is clear without evidence of cervical or supraclavicular adenopathy. Lungs are clear to A&P. Cardiac examination is essentially unremarkable with regular rate and rhythm without murmur rub or thrill. Abdomen is benign with no organomegaly or masses noted. Motor sensory and DTR levels are equal and symmetric in the upper and lower extremities. Cranial nerves II  through XII are grossly intact. Proprioception is intact. No peripheral adenopathy or edema is identified. No motor or sensory levels are noted. Crude visual fields are within normal range.  RADIOLOGY RESULTS: No current films for review  PLAN: Present time patient is doing well 7 months out from chest wall and peripheral lymphatic radiation.  I am pleased with her overall progress.  I have asked to see her back in 6 months for follow-up.  Patient knows to call with any concerns.  I would like to take this opportunity to thank you for allowing me to participate in the care of your patient.Carmina Miller, MD

## 2023-04-07 ENCOUNTER — Other Ambulatory Visit: Payer: Self-pay

## 2023-04-08 ENCOUNTER — Ambulatory Visit: Payer: 59

## 2023-04-08 ENCOUNTER — Encounter: Payer: Self-pay | Admitting: Nurse Practitioner

## 2023-04-08 ENCOUNTER — Other Ambulatory Visit: Payer: 59

## 2023-04-08 ENCOUNTER — Inpatient Hospital Stay: Payer: 59 | Admitting: Nurse Practitioner

## 2023-04-08 ENCOUNTER — Inpatient Hospital Stay: Payer: 59

## 2023-04-08 ENCOUNTER — Ambulatory Visit: Payer: 59 | Admitting: Nurse Practitioner

## 2023-04-08 ENCOUNTER — Inpatient Hospital Stay: Payer: 59 | Attending: Internal Medicine

## 2023-04-08 VITALS — BP 114/69 | HR 67 | Temp 97.8°F | Resp 18

## 2023-04-08 VITALS — BP 118/70 | HR 74 | Temp 97.3°F | Wt 207.0 lb

## 2023-04-08 DIAGNOSIS — Z79810 Long term (current) use of selective estrogen receptor modulators (SERMs): Secondary | ICD-10-CM | POA: Diagnosis not present

## 2023-04-08 DIAGNOSIS — Z170 Estrogen receptor positive status [ER+]: Secondary | ICD-10-CM

## 2023-04-08 DIAGNOSIS — C773 Secondary and unspecified malignant neoplasm of axilla and upper limb lymph nodes: Secondary | ICD-10-CM | POA: Insufficient documentation

## 2023-04-08 DIAGNOSIS — Z5112 Encounter for antineoplastic immunotherapy: Secondary | ICD-10-CM

## 2023-04-08 DIAGNOSIS — D649 Anemia, unspecified: Secondary | ICD-10-CM | POA: Diagnosis not present

## 2023-04-08 DIAGNOSIS — C50411 Malignant neoplasm of upper-outer quadrant of right female breast: Secondary | ICD-10-CM | POA: Diagnosis not present

## 2023-04-08 DIAGNOSIS — D500 Iron deficiency anemia secondary to blood loss (chronic): Secondary | ICD-10-CM

## 2023-04-08 DIAGNOSIS — D696 Thrombocytopenia, unspecified: Secondary | ICD-10-CM | POA: Insufficient documentation

## 2023-04-08 DIAGNOSIS — E119 Type 2 diabetes mellitus without complications: Secondary | ICD-10-CM | POA: Insufficient documentation

## 2023-04-08 DIAGNOSIS — E876 Hypokalemia: Secondary | ICD-10-CM | POA: Insufficient documentation

## 2023-04-08 HISTORY — PX: MASTECTOMY: SHX3

## 2023-04-08 LAB — COMPREHENSIVE METABOLIC PANEL
ALT: 24 U/L (ref 0–44)
AST: 37 U/L (ref 15–41)
Albumin: 3 g/dL — ABNORMAL LOW (ref 3.5–5.0)
Alkaline Phosphatase: 53 U/L (ref 38–126)
Anion gap: 8 (ref 5–15)
BUN: 11 mg/dL (ref 6–20)
CO2: 25 mmol/L (ref 22–32)
Calcium: 8.6 mg/dL — ABNORMAL LOW (ref 8.9–10.3)
Chloride: 105 mmol/L (ref 98–111)
Creatinine, Ser: 0.6 mg/dL (ref 0.44–1.00)
GFR, Estimated: >60 mL/min (ref >60)
Glucose, Bld: 184 mg/dL — ABNORMAL HIGH (ref 70–99)
Potassium: 3.4 mmol/L — ABNORMAL LOW (ref 3.5–5.1)
Sodium: 138 mmol/L (ref 135–145)
Total Bilirubin: 0.6 mg/dL (ref 0.3–1.2)
Total Protein: 6.6 g/dL (ref 6.5–8.1)

## 2023-04-08 LAB — CBC WITH DIFFERENTIAL/PLATELET
Abs Immature Granulocytes: 0.01 10*3/uL (ref 0.00–0.07)
Basophils Absolute: 0 10*3/uL (ref 0.0–0.1)
Basophils Relative: 1 %
Eosinophils Absolute: 0.1 10*3/uL (ref 0.0–0.5)
Eosinophils Relative: 4 %
HCT: 33.4 % — ABNORMAL LOW (ref 36.0–46.0)
Hemoglobin: 10.9 g/dL — ABNORMAL LOW (ref 12.0–15.0)
Immature Granulocytes: 0 %
Lymphocytes Relative: 28 %
Lymphs Abs: 1 10*3/uL (ref 0.7–4.0)
MCH: 28.2 pg (ref 26.0–34.0)
MCHC: 32.6 g/dL (ref 30.0–36.0)
MCV: 86.3 fL (ref 80.0–100.0)
Monocytes Absolute: 0.3 10*3/uL (ref 0.1–1.0)
Monocytes Relative: 8 %
Neutro Abs: 2.1 10*3/uL (ref 1.7–7.7)
Neutrophils Relative %: 59 %
Platelets: 77 10*3/uL — ABNORMAL LOW (ref 150–400)
RBC: 3.87 MIL/uL (ref 3.87–5.11)
RDW: 16 % — ABNORMAL HIGH (ref 11.5–15.5)
WBC: 3.6 10*3/uL — ABNORMAL LOW (ref 4.0–10.5)
nRBC: 0 % (ref 0.0–0.2)

## 2023-04-08 MED ORDER — PROCHLORPERAZINE MALEATE 10 MG PO TABS
10.0000 mg | ORAL_TABLET | Freq: Once | ORAL | Status: AC
  Administered 2023-04-08: 10 mg via ORAL
  Filled 2023-04-08: qty 1

## 2023-04-08 MED ORDER — ZOLEDRONIC ACID 4 MG/100ML IV SOLN
4.0000 mg | Freq: Once | INTRAVENOUS | Status: AC
Start: 2023-04-08 — End: 2023-04-08
  Administered 2023-04-08: 4 mg via INTRAVENOUS
  Filled 2023-04-08: qty 100

## 2023-04-08 MED ORDER — SODIUM CHLORIDE 0.9 % IV SOLN
3.6000 mg/kg | Freq: Once | INTRAVENOUS | Status: AC
  Administered 2023-04-08: 360 mg via INTRAVENOUS
  Filled 2023-04-08: qty 8

## 2023-04-08 MED ORDER — SODIUM CHLORIDE 0.9 % IV SOLN
Freq: Once | INTRAVENOUS | Status: AC
  Filled 2023-04-08: qty 250

## 2023-04-08 MED ORDER — DIPHENHYDRAMINE HCL 25 MG PO CAPS
25.0000 mg | ORAL_CAPSULE | Freq: Once | ORAL | Status: AC
  Administered 2023-04-08: 25 mg via ORAL
  Filled 2023-04-08: qty 1

## 2023-04-08 MED ORDER — ACETAMINOPHEN 325 MG PO TABS
650.0000 mg | ORAL_TABLET | Freq: Once | ORAL | Status: AC
  Administered 2023-04-08: 650 mg via ORAL
  Filled 2023-04-08: qty 2

## 2023-04-08 MED ORDER — HEPARIN SOD (PORK) LOCK FLUSH 100 UNIT/ML IV SOLN
500.0000 [IU] | Freq: Once | INTRAVENOUS | Status: AC | PRN
  Administered 2023-04-08: 500 [IU]
  Filled 2023-04-08: qty 5

## 2023-04-08 NOTE — Patient Instructions (Signed)
[facility] [place] [facility]  Discharge Instructions: Thank you for choosing [facility] to provide your oncology and hematology care.  If you have a lab appointment with the Cancer Center, please go directly to the Cancer Center and check in at the registration area.  Wear comfortable clothing and clothing appropriate for easy access to any Portacath or PICC line.   We strive to give you quality time with your provider. You may need to reschedule your appointment if you arrive late (15 or more minutes).  Arriving late affects you and other patients whose appointments are after yours.  Also, if you miss three or more appointments without notifying the office, you may be dismissed from the clinic at the provider's discretion.      For prescription refill requests, have your pharmacy contact our office and allow 72 hours for refills to be completed.    Today you received the following chemotherapy and/or immunotherapy agents- kadcyla      To help prevent nausea and vomiting after your treatment, we encourage you to take your nausea medication as directed.  BELOW ARE SYMPTOMS THAT SHOULD BE REPORTED IMMEDIATELY: *FEVER GREATER THAN 100.4 F (38 C) OR HIGHER *CHILLS OR SWEATING *NAUSEA AND VOMITING THAT IS NOT CONTROLLED WITH YOUR NAUSEA MEDICATION *UNUSUAL SHORTNESS OF BREATH *UNUSUAL BRUISING OR BLEEDING *URINARY PROBLEMS (pain or burning when urinating, or frequent urination) *BOWEL PROBLEMS (unusual diarrhea, constipation, pain near the anus) TENDERNESS IN MOUTH AND THROAT WITH OR WITHOUT PRESENCE OF ULCERS (sore throat, sores in mouth, or a toothache) UNUSUAL RASH, SWELLING OR PAIN  UNUSUAL VAGINAL DISCHARGE OR ITCHING   Items with * indicate a potential emergency and should be followed up as soon as possible or go to the Emergency Department if any problems should occur.  Please show the CHEMOTHERAPY ALERT CARD or IMMUNOTHERAPY ALERT CARD at check-in to  the Emergency Department and triage nurse.  Should you have questions after your visit or need to cancel or reschedule your appointment, please contact [facility] [place] [facility]  [phone] and follow the prompts.  Office hours are 8:00 a.m. to 4:30 p.m. Monday - Friday. Please note that voicemails left after 4:00 p.m. may not be returned until the following business day.  We are closed weekends and major holidays. You have access to a nurse at all times for urgent questions. Please call the main number to the clinic [phone] and follow the prompts.  For any non-urgent questions, you may also contact your provider using MyChart. We now offer e-Visits for anyone 18 and older to request care online for non-urgent symptoms. For details visit [email or web address].   Also download the MyChart app! Go to the app store, search "MyChart", open the app, select [place], and log in with your MyChart username and password.    

## 2023-04-08 NOTE — Addendum Note (Signed)
Addended by: Clydia Llano on: 04/08/2023 01:28 PM   Modules accepted: Orders

## 2023-04-08 NOTE — Progress Notes (Signed)
[facility] CONSULT NOTE  [name], [name], [name], [name]  CHIEF COMPLAINTS/PURPOSE OF CONSULTATION: Breast cancer  Oncology History Overview Note  MAY 2023-    Targeted ultrasound is performed, showing a 2.9 x 1.9 x 2.0 cm irregular hypoechoic mass right breast 10 o'clock position 6 cm from nipple at the site of palpable concern.   There is an abnormal 8 mm thickened right axillary lymph node.   There is skin thickening overlying the right breast. ------------------  A. BREAST, RIGHT AT 10:00, 6 CM FROM THE NIPPLE; ULTRASOUND-GUIDED CORE  NEEDLE BIOPSY:  - INVASIVE MAMMARY CARCINOMA, NO SPECIAL TYPE.   Size of invasive carcinoma: 11 mm in this sample  Histologic grade of invasive carcinoma: Grade 2                       Glandular/tubular differentiation score: 3                       Nuclear pleomorphism score: 2                       Mitotic rate score: 1                       Total score: 6  Ductal carcinoma in situ: Present, intermediate grade  Lymphovascular invasion: Not identified   B. LYMPH NODE, RIGHT AXILLARY; ULTRASOUND-GUIDED CORE NEEDLE BIOPSY:  - 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 IN GREATEST  EXTENT.   Comment:  The malignancy in the primary breast lesion appears morphologically  different from tumor in the lymph node sampling, with tumor present in  lymph node more suggestive of a histologic grade 3, with significantly  increased mitotic activity and pleomorphism. Due to the discrepancy in  these morphologic patterns, ER/PR/HER2 immunohistochemistry will be  performed on both blocks A1 and B1, with reflex to FISH for HER2 2+. The  results will be reported in an addendum.   ADDENDUM:  CASE SUMMARY: BREAST BIOMARKER TESTS - A - BREAST, RIGHT AT 10:00  Estrogen Receptor  (ER) Status: POSITIVE          Percentage of cells with nuclear positivity: 71-80%          Average intensity of staining: Strong   Progesterone Receptor (PgR) Status: POSITIVE          Percentage of cells with nuclear positivity: 81-90%          Average intensity of staining: Strong   HER2 (by immunohistochemistry): POSITIVE (Score 3+)       Percentage of cells with uniform intense complete membrane  staining: 61-70%  HER2 displays a heterogeneous staining pattern, with areas showing a  strong 3+ staining pattern, and other regions with complete absence (0+)  of staining.   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  Discussed with Dr. [name] tumor heterogeneity. BREAST MRI- JUNE 2023- The patient's known malignancy in the upper outer quadrant of the right breast measures 3.8 x 2.8 x 3.6 cm. Numerous other suspicious masses are identified in the right breast as above involving the upper outer and upper inner quadrants, located both anterior and posterior to the known malignancy.  Numerous enhancing foci in the skin as above are worrisome for skin metastases ; Bx proven. Biopsy proven metastatic node in the right axilla.  No evidence of malignancy in the left breast;  Two mildly prominent right internal mammary nodes were not FDG avid on recent PET-CT imaging.  Currently on neoadjuvant chemotherapy- TCH  plus P x6 cycles;  MUGA scan May 31st, 2023- -61%.    # June 7th, 2023- NEO-ADJUVANT CHEMO- TCH+P x6 cycles- s/p Mastectomy [NOV 15th, 2023] [Dr.Byrnett]-  ypT2 [79mm]; ypN0.  # JAN mid, 2024 -TAMOXFEN 20 mg a day; ADD LupronFEB 2nd.2 2024 s/p RT maech, 22nd- 2024  # FEB 2nd, 2023- Kadcyla q 3 W x14 cycles;   # Lupron q3 M [/start 2/07-2022]   # Genetic counseling s/p- NEG for any deleterious mutations.     Carcinoma of upper-outer quadrant of right breast in female, estrogen receptor positive (HCC)  10/26/2021 Initial Diagnosis   Carcinoma of upper-outer quadrant of right breast in female, estrogen receptor positive (HCC)   10/26/2021 Cancer Staging   Staging form: Breast, AJCC 8th Edition - Clinical: Stage IIIA (cT4d, cN1, cM0, G2, ER+, PR+, HER2+) - Signed by [name], MD on 06/04/2022 Histologic grading system: 3 grade system   11/10/2021 - 03/01/2022 Chemotherapy   Patient is on Treatment Plan : BREAST  Docetaxel + Carboplatin + Trastuzumab + Pertuzumab  (TCHP) q21d      11/11/2021 - 01/13/2022 Chemotherapy   Patient is on Treatment Plan : BREAST  Docetaxel + Carboplatin + Trastuzumab + Pertuzumab  (TCHP) q21d       Genetic Testing   Negative genetic testing. No pathogenic variants identified on the [facility] CancerNext-Expanded+RNA panel. The report date is 11/29/2021.  The CancerNext-Expanded + RNAinsight gene panel offered by [employer] and includes sequencing and rearrangement analysis for the following 77 genes: IP, ALK, APC*, ATM*, AXIN2, BAP1, BARD1, BLM, BMPR1A, BRCA1*, BRCA2*, BRIP1*, CDC73, CDH1*,CDK4, CDKN1B, CDKN2A, CHEK2*, CTNNA1, DICER1, FANCC, FH, FLCN, GALNT12, KIF1B, LZTR1, MAX, MEN1, MET, MLH1*, MSH2*, MSH3, MSH6*, MUTYH*, NBN, NF1*, NF2, NTHL1, PALB2*, PHOX2B, PMS2*, POT1, PRKAR1A, PTCH1, PTEN*, RAD51C*, RAD51D*,RB1, RECQL, RET, SDHA, SDHAF2, SDHB, SDHC, SDHD, SMAD4, SMARCA4, SMARCB1, SMARCE1, STK11, SUFU, TMEM127, TP53*,TSC1, TSC2, VHL and XRCC2 (sequencing  and deletion/duplication); EGFR, EGLN1, HOXB13, KIT, MITF, PDGFRA, POLD1 and POLE (sequencing only); EPCAM and GREM1 (deletion/duplication only).   07/09/2022 -  Chemotherapy   Patient is on Treatment Plan : BREAST ADO-Trastuzumab Emtansine (Kadcyla) q21d      HISTORY OF PRESENTING ILLNESS: Patient is ambulating independently.  Alone.   Marilyn Page 47 y.o.  female right breast TRIPLE POSITIVE with T4- Stage III -s/p mastectomy-with expanders-currently on adjuvant kadcyla / tamoxifen + OFS  is here for follow-up.  She continues to do well. She has noticed some bruising but no bleeding. Remains compliant with potassium. No chest pain, skin changes. No increased shortness of breath.  Review of Systems  Constitutional:  Positive for malaise/fatigue. Negative for chills, diaphoresis, fever and weight loss.  HENT:  Negative for nosebleeds and sore throat.   Eyes:  Negative for double vision.  Respiratory:  Negative for cough, hemoptysis, sputum production, shortness of breath and wheezing.   Cardiovascular:  Negative for chest pain, palpitations, orthopnea and leg swelling.  Gastrointestinal:  Negative for abdominal pain, blood in stool, constipation, diarrhea, heartburn, melena, nausea and vomiting.  Genitourinary:  Negative for dysuria, frequency and urgency.  Musculoskeletal:  Positive for myalgias. Negative for back pain and joint pain.  Skin: Negative.  Negative for itching and rash.  Neurological:  Negative for dizziness, tingling, focal weakness, weakness and headaches.  Endo/Heme/Allergies:  Does not bruise/bleed easily.  Psychiatric/Behavioral:  Negative for depression. The patient is not nervous/anxious and does not have insomnia.     MEDICAL HISTORY:  Past Medical History:  Diagnosis Date   Anemia    Carcinoma of upper-outer quadrant of right breast in female, estrogen receptor positive (HCC) 10/26/2021   Diabetes mellitus without complication (HCC)    Family history of adverse  reaction to anesthesia    grandmother had issues but doesnt know what the issue was, she was older.   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 Axilla Butterfly hydro clip-MACRO METASTATIC MAMMARY CARCINOMA   BREAST BIOPSY Right 04/21/2022   [place] RT PLC BREAST LOC DEV   1ST LESION  INC US GUIDE 04/21/2022 [facility]   BREAST RECONSTRUCTION WITH PLACEMENT OF TISSUE EXPANDER AND FLEX HD (ACELLULAR HYDRATED DERMIS) Right 04/21/2022   Procedure: RIGHT BREAST RECONSTRUCTION WITH PLACEMENT OF TISSUE EXPANDER AND FLEX HD (ACELLULAR HYDRATED DERMIS);  Surgeon: Peggye Form, DO;  Location: [facility] ORS;  Service: Plastics;  Laterality: Right;   CESAREAN SECTION  2005/2008   DILATION AND CURETTAGE OF UTERUS  2003   PORTACATH PLACEMENT Left 11/09/2021   Procedure: INSERTION PORT-A-CATH;  Surgeon: Earline Mayotte, MD;  Location: [facility] OR[facility];  Service: General;  Laterality: Left;   REMOVAL OF TISSUE EXPANDER AND PLACEMENT OF IMPLANT Right 06/08/2022   Procedure: REMOVAL OF TISSUE EXPANDER;  Surgeon: Peggye Form, DO;  Location: [facility];  Service: Plastics;  Laterality: Right;   SIMPLE MASTECTOMY WITH AXILLARY SENTINEL NODE BIOPSY Right 04/21/2022   Procedure: SIMPLE MASTECTOMY WITH AXILLARY SENTINEL NODE BIOPSY;  Surgeon: Earline Mayotte, MD;  Location: [facility];  Service: General;  Laterality: Right;  wire localization of lymph node   SKIN BIOPSY Right 11/09/2021   Procedure: PUNCH BIOPSY SKIN, TATTOO LYMPH NODE RIGHT AXILLA;  Surgeon: Earline Mayotte, MD;  Location: [facility] ORS;  Service: General;  Laterality: Right;   WISDOM TOOTH  EXTRACTION     SOCIAL HISTORY: Social History   Socioeconomic History   Marital status: Married    Spouse name: Fraser Din   Number of children: 2   Years of education: Not on file   Highest education level: Not on file  Occupational History   Occupation: and Architectural technologist  Tobacco Use   Smoking status: Never   Smokeless tobacco: Never  Vaping Use   Vaping status:  Never Used  Substance and Sexual Activity   Alcohol use: Never   Drug use: Never   Sexual activity: Yes    Birth control/protection: Other-see comments, Surgical    Comment: vasectomy  Other Topics Concern   Not on file  Social History Narrative   Pre-school teacher; in [place]; 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    Social Determinants of Health   Financial Resource Strain: Not on file  Food Insecurity: Not on file  Transportation Needs: Not on file  Physical Activity: Inactive (09/26/2017)   Exercise Vital Sign    Days of Exercise per Week: 0 days    Minutes of Exercise per Session: 0 min  Stress: Not on file  Social Connections: Not on file  Intimate Partner Violence: Not on file    FAMILY HISTORY: Family History  Problem Relation Age of Onset   Ovarian cysts Mother    Migraines Mother    Melanoma Mother        on leg   Hyperlipidemia Father    Diabetes Maternal Aunt    Stomach cancer Paternal Aunt    Diabetes Maternal Grandfather    Lymphoma Cousin 17   Breast cancer Neg Hx     ALLERGIES:  is allergic to metformin and oxycodone.  MEDICATIONS:  Current Outpatient Medications  Medication Sig Dispense Refill   acetaminophen (TYLENOL) 325 MG tablet Take 2 tablets (650 mg total) by mouth every 6 (six) hours as needed for mild pain (or Fever >/= 101).     ascorbic acid (VITAMIN C) 500 MG tablet Take 1 tablet (500 mg total) by mouth daily. 30 tablet    calcium-vitamin D (OSCAL WITH D) 500-5 MG-MCG tablet Take 1 tablet by mouth 2 (two) times daily. 60 tablet 2   Cyanocobalamin (VITAMIN B-12 PO) Take 1  tablet by mouth daily.     Dapagliflozin Propanediol (FARXIGA PO) Take 10 mg by mouth daily.     lidocaine-prilocaine (EMLA) cream Apply on the port. 30 -45 min  prior to port access. 30 g 3   loratadine (CLARITIN) 10 MG tablet Take 10 mg by mouth as needed for allergies. Day before tx, day of and day after.     Magnesium Cl-Calcium Carbonate (SLOW MAGNESIUM/CALCIUM) 70-117 MG TBEC Take 1 tablet by mouth 2 (two) times daily. 60 tablet 6   Multiple Vitamin (MULTIVITAMIN WITH MINERALS) TABS tablet Take 1 tablet by mouth daily.     ondansetron (ZOFRAN) 4 MG tablet Take 1 tablet (4 mg total) by mouth every 8 (eight) hours as needed for up to 20 doses for nausea or vomiting. 20 tablet 0   ondansetron (ZOFRAN-ODT) 8 MG disintegrating tablet Take 8 mg by mouth every 8 (eight) hours as needed for nausea or vomiting.     ONETOUCH VERIO test strip SMARTSIG:1 Each Via Meter Daily PRN     pioglitazone (ACTOS) 15 MG tablet Take 15 mg by mouth daily.     potassium chloride (KLOR-CON) 20 MEQ packet Take 20 mEq by mouth 2 (two) times daily. 60 packet 3   prochlorperazine (COMPAZINE) 10 MG tablet Take 1 tablet (10 mg total) by mouth every 6 (six) hours as needed for nausea or vomiting. 40 tablet 1   tamoxifen (NOLVADEX) 20 MG tablet TAKE 1 TABLET BY MOUTH EVERY DAY 90 tablet 1   ferrous sulfate 325 (65 FE) MG tablet Take 325 mg by mouth 2 (two) times daily with a meal. (Patient not taking: Reported on 04/06/2023)     No current  facility-administered medications for this visit.   PHYSICAL EXAMINATION: ECOG PERFORMANCE STATUS: 0 - Asymptomatic  Vitals:   04/08/23 0846  BP: 118/70  Pulse: 74  Temp: (!) 97.3 F (36.3 C)  SpO2: 100%   Filed Weights   04/08/23 0846  Weight: 207 lb (93.9 kg)   Physical Exam Vitals reviewed.  Constitutional:      Appearance: She is not ill-appearing.  HENT:     Head: Normocephalic and atraumatic.     Mouth/Throat:     Pharynx: Oropharynx is clear.  Eyes:      Extraocular Movements: Extraocular movements intact.     Pupils: Pupils are equal, round, and reactive to light.  Cardiovascular:     Rate and Rhythm: Normal rate and regular rhythm.  Pulmonary:     Comments: Decreased breath sounds bilaterally.  Abdominal:     General: There is no distension.     Palpations: Abdomen is soft.  Musculoskeletal:        General: Normal range of motion.  Skin:    General: Skin is warm.     Coloration: Skin is not pale.  Neurological:     General: No focal deficit present.     Mental Status: She is alert and oriented to person, place, and time.  Psychiatric:        Mood and Affect: Mood normal.        Behavior: Behavior normal.    LABORATORY DATA:  I have reviewed the data as listed Lab Results  Component Value Date   WBC 3.6 (L) 04/08/2023   HGB 10.9 (L) 04/08/2023   HCT 33.4 (L) 04/08/2023   MCV 86.3 04/08/2023   PLT 77 (L) 04/08/2023   Recent Labs    02/25/23 1306 03/18/23 1328 04/08/23 0831  NA 138 137 138  K 3.1* 3.0* 3.4*  CL 105 105 105  CO2 25 26 25   GLUCOSE 156* 173* 184*  BUN 13 12 11   CREATININE 0.68 0.64 0.60  CALCIUM 8.7* 8.8* 8.6*  GFRNONAA >60 >60 >60  PROT 7.0 6.9 6.6  ALBUMIN 3.3* 3.2* 3.0*  AST 37 37 37  ALT 31 25 24   ALKPHOS 51 50 53  BILITOT 0.5 0.7 0.6    RADIOGRAPHIC STUDIES: I have personally reviewed the radiological images as listed and agreed with the findings in the report. ECHOCARDIOGRAM COMPLETE  Result Date: 03/25/2023    ECHOCARDIOGRAM REPORT   Patient Name:   Marilyn Page Date of Exam: 03/25/2023 Medical Rec #:  [id number]      Height:       66.0 in Accession #:    [id number]     Weight:       208.0 lb Date of Birth:  1975-09-03      BSA:          2.033 m Patient Age:    47 years       BP:           115/49 mmHg Patient Gender: F              HR:           73 bpm. Exam Location:  [facility] Procedure: 2D Echo, Cardiac Doppler, Color Doppler and Strain Analysis Indications:     Chemo Z09                   Status post administartion of cardiotoxic chemotherapy meds  History:         Patient has no  prior history of Echocardiogram examinations.                  Risk Factors:Diabetes. Breast cancer.  Sonographer:     Cristela Blue Referring Phys:  [id number] [name] Diagnosing Phys: Debbe Odea MD  Sonographer Comments: Global longitudinal strain was attempted. IMPRESSIONS  1. Left ventricular ejection fraction, by estimation, is 60 to 65%. The left ventricle has normal function. The left ventricle has no regional wall motion abnormalities. Left ventricular diastolic parameters were normal.  2. Right ventricular systolic function is normal. The right ventricular size is normal.  3. The mitral valve is normal in structure. No evidence of mitral valve regurgitation.  4. The aortic valve is tricuspid. Aortic valve regurgitation is not visualized. FINDINGS  Left Ventricle: Left ventricular ejection fraction, by estimation, is 60 to 65%. The left ventricle has normal function. The left ventricle has no regional wall motion abnormalities. Global longitudinal strain performed but not reported based on interpreter judgement due to suboptimal tracking. The left ventricular internal cavity size was normal in size. There is no left ventricular hypertrophy. Left ventricular diastolic parameters were normal. Right Ventricle: The right ventricular size is normal. No increase in right ventricular wall thickness. Right ventricular systolic function is normal. Left Atrium: Left atrial size was normal in size. Right Atrium: Right atrial size was normal in size. Pericardium: There is no evidence of pericardial effusion. Mitral Valve: The mitral valve is normal in structure. No evidence of mitral valve regurgitation. MV peak gradient, 3.5 mmHg. The mean mitral valve gradient is 2.0 mmHg. Tricuspid Valve: The tricuspid valve is normal in structure. Tricuspid valve regurgitation is trivial. Aortic Valve: The aortic valve is  tricuspid. Aortic valve regurgitation is not visualized. Aortic valve mean gradient measures 3.0 mmHg. Aortic valve peak gradient measures 6.2 mmHg. Aortic valve area, by VTI measures 3.10 cm. Pulmonic Valve: The pulmonic valve was normal in structure. Pulmonic valve regurgitation is not visualized. Aorta: The aortic root is normal in size and structure. Venous: The inferior vena cava was not well visualized. IAS/Shunts: No atrial level shunt detected by color flow Doppler.  LEFT VENTRICLE PLAX 2D LVIDd:         5.00 cm   Diastology LVIDs:         3.10 cm   LV e' medial:    9.90 cm/s LV PW:         1.20 cm   LV E/e' medial:  10.3 LV IVS:        1.10 cm   LV e' lateral:   13.20 cm/s LVOT diam:     2.00 cm   LV E/e' lateral: 7.7 LV SV:         75 LV SV Index:   37 LVOT Area:     3.14 cm  RIGHT VENTRICLE RV Basal diam:  3.10 cm RV Mid diam:    3.00 cm LEFT ATRIUM             Index        RIGHT ATRIUM           Index LA diam:        3.20 cm 1.57 cm/m   RA Area:     11.80 cm LA Vol (A2C):   57.8 ml 28.43 ml/m  RA Volume:   24.00 ml  11.80 ml/m LA Vol (A4C):   35.1 ml 17.26 ml/m LA Biplane Vol: 47.9 ml 23.56 ml/m  AORTIC VALVE AV Area (Vmax):  2.56 cm AV Area (Vmean):   2.56 cm AV Area (VTI):     3.10 cm AV Vmax:           124.00 cm/s AV Vmean:          84.600 cm/s AV VTI:            0.243 m AV Peak Grad:      6.2 mmHg AV Mean Grad:      3.0 mmHg LVOT Vmax:         101.00 cm/s LVOT Vmean:        69.000 cm/s LVOT VTI:          0.240 m LVOT/AV VTI ratio: 0.99  AORTA Ao Root diam: 3.10 cm MITRAL VALVE                TRICUSPID VALVE MV Area (PHT): 4.44 cm     TR Peak grad:   15.4 mmHg MV Area VTI:   3.61 cm     TR Vmax:        196.00 cm/s MV Peak grad:  3.5 mmHg MV Mean grad:  2.0 mmHg     SHUNTS MV Vmax:       0.93 m/s     Systemic VTI:  0.24 m MV Vmean:      68.2 cm/s    Systemic Diam: 2.00 cm MV Decel Time: 171 msec MV E velocity: 102.00 cm/s MV A velocity: 82.30 cm/s MV E/A ratio:  1.24 Debbe Odea MD  Electronically signed by Debbe Odea MD Signature Date/Time: 03/25/2023/1:02:48 PM    Final     ASSESSMENT & PLAN:   # MAY 2023- STAGE III-T4N1 ER/PR positive HER2 POSITIVE RIGHT breast cancer; LN-positive-ER/PR positive her-2 NEGATIVE; # s/p neoadjuvant chemotherapy- TCH plus P x 6 cycles- s/p Right mastectomy-[15th, NOv 2023] ypT2 [49mm]; ypN0.  Given partial response/involvement of the breast skin/multifocal disease- s/p RT- 08/27/2022. Patient currently on tamoxifen [DEC-JAN 2024]; + OFS- Lupron every 3 months ]. ON Ado-trastuzumab emtansine for 14 cycles.    # proceed with Ado-trastuzumab emtansine #14 of planned 14 cycles.  MUGA- June 13th, 2024- Normal LEFT ventricular ejection fraction -58%   stable. Left mammogram- wnl.  Stable. Oct 18th, 2024- Echo- with LVEF 60-65. Patient will follow in DEC 2024- re: zometa infusion. Continue tamoxifen.   # she has history of uterine adenomyosis on prior imaging (CT 2023). Currently asymptomatic and on lupron. In setting of tamoxifen however, may need to monitor with imaging. She has been followed by Althea Grimmer, PA/[facility].    # Mild Thrombocytopenia- >90  monitor for now. Now dipped to 77. Asymptomatic. Monitor.   # Allergic reaction to Ado-trastuzumab emtansine-fever/rash- currently on Claritin x3 days postchemotherapy; Tylenol preemptively.  Decreased Benadryl 25 mg given drowsiness. Stable.    # Anemia- hx of iron deficiency. Question ida vs intake. Plan to check iron and ferritin at next visit.    # Hypocalcemia-  ON adjuvant Zometa q 6 months x 3 years [first- May-17th, 2024].   Continue ca+Vit D BID. Vit D FEB 2024- 54; [albumin 3.0]-  Stable.   # Right sided swelling/tightness- s/p  Maureen-  Stable.   # Infusion reaction - Zometa [flu like symtoms]- improved with NSAIDs-  Stable.   # Diabetes- BG 148 [Fasting]- recently increased Farxiga 10 mg- [OFF ozempic/metformin- intolerance]-  Stable.   # Hypokalemia: continue Kdur  BID- Stable.   # IV access: port functional   Lupron 02/04/2023 q 56M; Zometa [over  30 mins] q 6 M- #1 10/22/22    # DISPOSITION: # Kadcyla today # 4-5 weeks- lab (cbc, cmp, ferritin, iron studies), Dr Donneta Romberg, lupron & zometa- la  No problem-specific Assessment & Plan notes found for this encounter.  All questions were answered. The patient/family knows to call the clinic with any problems, questions or concerns.    Alinda Dooms, NP 04/08/2023

## 2023-04-11 ENCOUNTER — Ambulatory Visit: Payer: 59 | Attending: Internal Medicine

## 2023-04-11 DIAGNOSIS — M6281 Muscle weakness (generalized): Secondary | ICD-10-CM | POA: Diagnosis present

## 2023-04-11 DIAGNOSIS — M25611 Stiffness of right shoulder, not elsewhere classified: Secondary | ICD-10-CM | POA: Insufficient documentation

## 2023-04-11 NOTE — Therapy (Signed)
OUTPATIENT PHYSICAL THERAPY SHOULDER TREATMENT Re-certification through 05/18/23   Patient Name: Marilyn Page MRN: [id number] DOB:01/19/1976, 47 y.o., female Today's Date: 04/11/2023  END OF SESSION:  PT End of Session - 04/11/23 1420     Visit Number 13    Number of Visits 20    Date for PT Re-Evaluation 05/18/23    Authorization Type 1x/week x 8 weeks: re-cert through 05/18/23    Authorization Time Period 30 v /calendar year PT/OT/Speech    PT Start Time 1415    PT Stop Time 1500    PT Time Calculation (min) 45 min    Activity Tolerance Patient tolerated treatment well    Behavior During Therapy [facility] for tasks assessed/performed                Past Medical History:  Diagnosis Date   Anemia    Carcinoma of upper-outer quadrant of right breast in female, estrogen receptor positive (HCC) 10/26/2021   Diabetes mellitus without complication (HCC)    Family history of adverse reaction to anesthesia    grandmother had issues but doesnt know what the issue was, she was older.   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 Axilla Butterfly hydro clip-MACRO METASTATIC MAMMARY CARCINOMA   BREAST BIOPSY Right 04/21/2022   [place] RT PLC BREAST LOC DEV   1ST LESION  INC US GUIDE 04/21/2022 [facility]   BREAST RECONSTRUCTION WITH PLACEMENT OF TISSUE EXPANDER AND FLEX HD (ACELLULAR HYDRATED DERMIS) Right 04/21/2022   Procedure: RIGHT BREAST RECONSTRUCTION WITH PLACEMENT OF TISSUE EXPANDER AND FLEX HD (ACELLULAR HYDRATED DERMIS);  Surgeon: Peggye Form, DO;  Location: [facility] ORS;  Service: Plastics;  Laterality: Right;   CESAREAN SECTION   2005/2008   DILATION AND CURETTAGE OF UTERUS  2003   PORTACATH PLACEMENT Left 11/09/2021   Procedure: INSERTION PORT-A-CATH;  Surgeon: Earline Mayotte, MD;  Location: [facility];  Service: General;  Laterality: Left;   REMOVAL OF TISSUE EXPANDER AND PLACEMENT OF IMPLANT Right 06/08/2022   Procedure: REMOVAL OF TISSUE EXPANDER;  Surgeon: Peggye Form, DO;  Location: [facility];  Service: Plastics;  Laterality: Right;   SIMPLE MASTECTOMY WITH AXILLARY SENTINEL NODE BIOPSY Right 04/21/2022   Procedure: SIMPLE MASTECTOMY WITH AXILLARY SENTINEL NODE BIOPSY;  Surgeon: Earline Mayotte, MD;  Location: [facility];  Service: General;  Laterality: Right;  wire localization of lymph node   SKIN BIOPSY Right 11/09/2021   Procedure: PUNCH BIOPSY SKIN, TATTOO LYMPH NODE RIGHT AXILLA;  Surgeon: Earline Mayotte, MD;  Location: [facility] OR[facility];  Service: General;  Laterality: Right;   WISDOM TOOTH EXTRACTION     Patient Active Problem List   Diagnosis Date Noted   Acquired absence of right breast 06/18/2022   Low vitamin B12 level 05/24/2022   Hypomagnesemia 05/16/2022   Lactic acid acidosis 03/06/2022   Fever and chills 03/05/2022   COVID-19 virus infection 12/28/2021   Thrombocytopenia (HCC) 12/28/2021   Diabetes mellitus (HCC) 12/28/2021   Febrile neutropenia (HCC) 12/27/2021   Sepsis (HCC) 12/27/2021   Genetic testing 12/01/2021   Carcinoma of upper-outer quadrant of right breast in female, estrogen receptor positive (  HCC) 10/26/2021   Uterus, adenomyosis 05/14/2021   ASCUS of cervix with negative high risk HPV 04/23/2021   Iron deficiency anemia due to chronic blood loss 04/23/2021   Type 2 diabetes mellitus without complication, without long-term current use of insulin (HCC) 05/18/2020   RLS (restless legs syndrome) 05/14/2020   Menorrhagia with regular cycle 10/14/2017   BMI 37.0-37.9, adult 10/03/2017   History of gestational diabetes 10/03/2017    PCP: [name], NP   REFERRING  PROVIDER: [name], MD   REFERRING DIAG:  M25.611 (ICD-10-CM) - Stiffness of right shoulder, not elsewhere classified  C50.411,Z17.0 (ICD-10-CM) - Carcinoma of upper-outer quadrant of right breast in female, estrogen receptor positive    THERAPY DIAG:  Stiffness of right shoulder, not elsewhere classified  Muscle weakness (generalized)  Rationale for Evaluation and Treatment: Rehabilitation  ONSET DATE: 04/2022   SUBJECTIVE:     From Initial evaluation note 01/31/23                                                                                                                                                                                  SUBJECTIVE STATEMENT: Patient has been seeing [name], OT for lymphedema. Patient reports pain and "pulling" on posterior aspect of upper arm. Patient reports difficulty sleeping, reaching overhead, getting dressed (clasping bra), lifting heavy objects.  Patient has pulley system and red theraband. Has been completing stretches 2x/week and pulley/theraband 2-3x/week.  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 Started 6 weeks of radiation 07/2022 and currently in chemotherapy.   Patient referred to PT for R shoulder pain s/p R mastectomy/removal of expanders and radiation. Patient has seen OT for lymphedema and R shoulder ROM ~1 month for maintenance. OT has provided patient with stretches focused on pec and lat muscle tightness (shoulder abduction and flexion in supine with rotation of hips to the L), child's pose, and AAROM shoulder flexion/abduction on the table.    PAIN:  Are you having pain? Yes: NPRS scale: 2/10 - at worst 8/10 Pain location: R shoulder  Pain description: sharp, achy Aggravating factors: reaching, quick movements with arm  Relieving factors: Ice, massage ball   PRECAUTIONS:  None  RED FLAGS: None   WEIGHT BEARING RESTRICTIONS: No  FALLS:  Has patient fallen in last 6 months? 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 [place]  PLOF: Independent  PATIENT GOALS:to get this to not hurt   NEXT MD VISIT: 02/04/23 - chemo  OBJECTIVE:   DIAGNOSTIC FINDINGS:  N/A  PATIENT SURVEYS:  FOTO 59 with goal of 59  COGNITION: Overall cognitive status: Within functional limits for tasks assessed     SENSATION: WFL  POSTURE: Rounded shoulders, forward head   UPPER EXTREMITY ROM:   Active ROM Right eval Left eval  Shoulder flexion 120 WFL   Shoulder extension    Shoulder abduction 87 WFL   Shoulder adduction    Shoulder internal rotation 35 WFL  Shoulder external rotation 30 WFL   (Blank rows = not tested)  UPPER EXTREMITY MMT:  MMT Right eval Left eval  Shoulder flexion 4+ 4+  Shoulder extension    Shoulder abduction 4* 4+  Shoulder adduction    Shoulder internal rotation 4* 4+  Shoulder external rotation 4* 4+  Middle trapezius    Lower trapezius    Elbow flexion 4+ 5  Elbow extension 4+ 5  (Blank rows = not tested)  JOINT MOBILITY TESTING:  Joint stiffness noted with AP/PA and inferior mobilization   PALPATION:  Significant trigger points and taut muscle bands at pec minor, R UT, and periscapular region.    TODAY'S TREATMENT:                                                                                                                                         DATE: 04/11/23 Subjective: Pt reports she had her last chemo treatment on Friday.  She reports no shoulder pain upon arrival.  She does not "itchy/tingly/pinchy" feeling under her armpit where there is some tightness in scar  tissue.  She reports working on her HEP and trying to move her arm throughout her day frequently.    Pain 0/10 R shoulder at rest  Objective:  Manual Therapy: 30 min Jt mobilizations: R GH distraction, A/P and inf glides Gr III/IV in various positions of flexion, abd (pt in supine hooklying position), seated distraction with IR HBB GR III/IV  STM/TPR R latissimus dorsi, subscap, teres major, teres minor, UT, pec major/minor/anterior chest wall (in prone and supine positions)  Manual pec minor stretch/scapular mob in sidelying  PROM flexion, abd, ER, IR- 5-10x ea  UPPER EXTREMITY ROM:   Active ROM Right eval Left eval Right 03/23/23  Shoulder flexion 120 WFL  140 supine 130 standing  Shoulder extension     Shoulder abduction 87 WFL  120 supine AAROM 100 standing   Shoulder adduction     Shoulder internal rotation 35 WFL HBB thumb to L4  Shoulder external rotation 30 WFL  55  (Blank rows = not tested)  Therapeutic Exercise: 10 min Standing shoulder AROM- flexion, abd, IR, ER after manual therapy x10 ea  Pt education for continuing with HEP and focusing on long duration  stretches in addition to functionally using her arm through her available ROM during the day.  Not today AAROM flexion, abd with dowel x 10 each- not today Towel IR hand behind back stretch 3x 20 seconds with strap- reviewed Finger ladder: abduction direction 5x with emphasis on relaxing UT substitution- pt notes "tightness" in anterior pec mm region- not today Wall physioball roll into flexion with end range flexion stretch x 10 Doorway pec stretch R 5x 30 seconds Scapular retractions in standing: 2x10 Scapular retraction + rows: 2x10 RTB- not today Seated thoracic extension x10 over chair- not today Standing row BTB 2 x 15- HEP- not today Standing IR with towel roll at elbow BTB 2 x 15- HEP Standing ER with towel roll at elbow RTB 2 x 15- HEP   PATIENT EDUCATION: Education details: HEP, POC,  goals, self TPR techniques Person educated: Patient Education method: Explanation, Demonstration, and Handouts Education comprehension: verbalized understanding  HOME EXERCISE PROGRAM: Access Code: [email or web address] URL: [email or web address] Date: 02/23/2023 Prepared by: Max Fickle  Exercises - Pec Minor Stretch  - 2-3 x daily - 7 x weekly - 5-10 reps - 30 hold - Corner Pec Major Stretch  - 2-3 x daily - 7 x weekly - 5-10 reps - 30 hold - Supine Shoulder Flexion with Dowel  - 2-3 x daily - 7 x weekly - 10 reps - 3-5 hold - Supine Shoulder Abduction AAROM with Dowel  - 2-3 x daily - 7 x weekly - 10 reps - 3-5 hold - Shoulder Flexion Wall Slide with Towel  - 1 x daily - 7 x weekly - 5 reps - 10-20 hold - Seated Shoulder Abduction Towel Slide at Table Top  - 1 x daily - 7 x weekly - 5 sets - 5 reps - 30 hold - Doorway Pec Stretch at 60 Elevation  - 1 x daily - 7 x weekly - 5 sets - 5 reps - 30 hold - Seated Shoulder External Rotation PROM on Table  - 1 x daily - 7 x weekly - 5 sets - 5 reps - 30 hold - Standing Row with Anchored Resistance  - 1 x daily - 3 x weekly - 2 sets - 10 reps - Shoulder Internal Rotation with Resistance  - 1 x daily - 3 x weekly - 2 sets - 10 reps - Shoulder External Rotation with Anchored Resistance  - 1 x daily - 3 x weekly - 2 sets - 10 reps - Standing Shoulder Internal Rotation Stretch with Towel  - 1 x daily - 7 x weekly - 3 reps - 15 hold  ASSESSMENT:  CLINICAL IMPRESSION:   Pt tolerated manual therapy for shoulder well today; scar and soft tissue restrictions in inferior/anterior axilla continue to contribute to ROM deficits.  GH jt hypomobility is improving.  Reviewed importance of long duration stretches for HEP.  Overall, making progress with PT.  Planning to continue with tx 1x/week for mainly manual therapy emphasis and progressing HEP as appropriate due to anticipated long term tissue remodeling timeframe for optimal recovery/outcome with PT.   Patient will benefit from skilled PT interventions to address listed impairments to improve ADL tolerance, pain reduction, and improve pain free R shoulder ROM.   OBJECTIVE IMPAIRMENTS: decreased activity tolerance, decreased ROM, decreased strength, hypomobility, impaired flexibility, impaired UE functional use, and postural dysfunction.   ACTIVITY LIMITATIONS: carrying, lifting, bathing, dressing, reach over head, and hygiene/grooming  PARTICIPATION LIMITATIONS: laundry, shopping, community activity, occupation, and yard work  PERSONAL FACTORS:  Past/current experiences, Profession, Time since onset of injury/illness/exacerbation, and 1 comorbidity: breast cancer s/p mastectomy  are also affecting patient's functional outcome.   REHAB POTENTIAL: Good  CLINICAL DECISION MAKING: Stable/uncomplicated  EVALUATION COMPLEXITY: Moderate   GOALS: Goals reviewed with patient? Yes  SHORT TERM GOALS: Target date: 04/08/2023  Patient will be independent in HEP to improve strength/ROM for better functional independence with ADLs. Baseline: 8/26: HEP initiated ; 03/23/23: in progress, adjusting and progressing HEP as appropriate Goal status: In Progress  LONG TERM GOALS: Target date: 12/11//2024  Patient will increase FOTO score to equal to or greater than 69 to demonstrate statistically significant improvement in mobility and quality of life.  Baseline: 8/26: 54 Goal status: In Progress  2.  Patient will demonstrate adequate R shoulder ROM and strength to be able to clasp bra, dress independently, and perform overhead activities with pain less than 2/10.  Baseline: 8/26: unable to clasp bra with IR limitation; 03/23/23: in progress, able to reach thumb to L4 R; able to reach hair but not lift item >7 lbs off high shelf with R UE only Goal status: In progress  3.  Patient will improve B UE strength by 1/2 MMT grade to improve ability to complete iADLs and job related tasks.  Baseline: 8/26:  see above; 03/23/23: in progress Goal status: In progress  4.  Patient will report a worst pain of 2/10 on NRPS in R shoulder to improve tolerance with ADLs and reduced symptoms with activities.  Baseline: 8/26: worst pain 8/10; 03/23/23: worst pain 4/10 Goal status: In progres  PLAN:  PT FREQUENCY: 1-2x/week  PT DURATION: 8 weeks  PLANNED INTERVENTIONS: Therapeutic exercises, Therapeutic activity, Patient/Family education, Self Care, Joint mobilization, Joint manipulation, DME instructions, Dry Needling, Spinal mobilization, Cryotherapy, Moist heat, Taping, Traction, and Manual therapy  PLAN FOR NEXT SESSION: joint mobilizations, scapulothoracic mob, continue ROM for shoulder ROM deficits; scapular retractions; continue manual therapy techniques STM/TPR; discussed touching base with [name] about her concern of swelling/lymphedema along R proximal ribcage under axilla.   Max Fickle, PT, DPT, OCS  Physical Therapist - [facility]  04/11/2023, 3:44 PM

## 2023-04-13 ENCOUNTER — Ambulatory Visit: Payer: 59

## 2023-04-16 ENCOUNTER — Other Ambulatory Visit: Payer: Self-pay | Admitting: Internal Medicine

## 2023-04-16 DIAGNOSIS — E876 Hypokalemia: Secondary | ICD-10-CM

## 2023-04-18 ENCOUNTER — Encounter: Payer: Self-pay | Admitting: Internal Medicine

## 2023-04-18 ENCOUNTER — Ambulatory Visit: Payer: 59

## 2023-04-18 NOTE — Telephone Encounter (Signed)
Component Ref Range & Units 10 d ago (04/08/23) 1 mo ago (03/18/23) 1 mo ago (02/25/23) 2 mo ago (02/04/23) 3 mo ago (01/14/23) 3 mo ago (12/24/22) 4 mo ago (12/03/22)  Potassium 3.5 - 5.1 mmol/L 3.4 Low  3.0 Low  3.1 Low  3.1 Low  3.4 Low  3.2 Low  3.4 Low

## 2023-04-25 ENCOUNTER — Ambulatory Visit: Payer: 59

## 2023-04-25 DIAGNOSIS — M25611 Stiffness of right shoulder, not elsewhere classified: Secondary | ICD-10-CM

## 2023-04-25 DIAGNOSIS — M6281 Muscle weakness (generalized): Secondary | ICD-10-CM

## 2023-04-25 NOTE — Therapy (Signed)
OUTPATIENT PHYSICAL THERAPY SHOULDER TREATMENT Re-certification through 05/18/23   Patient Name: Marilyn Page MRN: [id number] DOB:10/30/1975, 47 y.o., female Today's Date: 04/25/2023  END OF SESSION:  PT End of Session - 04/25/23 1412     Visit Number 14    Number of Visits 20    Date for PT Re-Evaluation 05/18/23    Authorization Type 1x/week x 8 weeks: re-cert through 05/18/23    Authorization Time Period 30 v /calendar year PT/OT/Speech    PT Start Time 1415    PT Stop Time 1500    PT Time Calculation (min) 45 min    Activity Tolerance Patient tolerated treatment well    Behavior During Therapy [facility] for tasks assessed/performed                Past Medical History:  Diagnosis Date   Anemia    Carcinoma of upper-outer quadrant of right breast in female, estrogen receptor positive (HCC) 10/26/2021   Diabetes mellitus without complication (HCC)    Family history of adverse reaction to anesthesia    grandmother had issues but doesnt know what the issue was, she was older.   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 Axilla Butterfly hydro clip-MACRO METASTATIC MAMMARY CARCINOMA   BREAST BIOPSY Right 04/21/2022   [place] RT PLC BREAST LOC DEV   1ST LESION  INC US GUIDE 04/21/2022 [facility]   BREAST RECONSTRUCTION WITH PLACEMENT OF TISSUE EXPANDER AND FLEX HD (ACELLULAR HYDRATED DERMIS) Right 04/21/2022   Procedure: RIGHT BREAST RECONSTRUCTION WITH PLACEMENT OF TISSUE EXPANDER AND FLEX HD (ACELLULAR HYDRATED DERMIS);  Surgeon: Peggye Form, DO;  Location: [facility]S;  Service: Plastics;  Laterality: Right;   CESAREAN SECTION   2005/2008   DILATION AND CURETTAGE OF UTERUS  2003   PORTACATH PLACEMENT Left 11/09/2021   Procedure: INSERTION PORT-A-CATH;  Surgeon: Earline Mayotte, MD;  Location: [facility];  Service: General;  Laterality: Left;   REMOVAL OF TISSUE EXPANDER AND PLACEMENT OF IMPLANT Right 06/08/2022   Procedure: REMOVAL OF TISSUE EXPANDER;  Surgeon: Peggye Form, DO;  Location: [facility];  Service: Plastics;  Laterality: Right;   SIMPLE MASTECTOMY WITH AXILLARY SENTINEL NODE BIOPSY Right 04/21/2022   Procedure: SIMPLE MASTECTOMY WITH AXILLARY SENTINEL NODE BIOPSY;  Surgeon: Earline Mayotte, MD;  Location: [facility];  Service: General;  Laterality: Right;  wire localization of lymph node   SKIN BIOPSY Right 11/09/2021   Procedure: PUNCH BIOPSY SKIN, TATTOO LYMPH NODE RIGHT AXILLA;  Surgeon: Earline Mayotte, MD;  Location: [facility];  Service: General;  Laterality: Right;   WISDOM TOOTH EXTRACTION     Patient Active Problem List   Diagnosis Date Noted   Acquired absence of right breast 06/18/2022   Low vitamin B12 level 05/24/2022   Hypomagnesemia 05/16/2022   Lactic acid acidosis 03/06/2022   Fever and chills 03/05/2022   COVID-19 virus infection 12/28/2021   Thrombocytopenia (HCC) 12/28/2021   Diabetes mellitus (HCC) 12/28/2021   Febrile neutropenia (HCC) 12/27/2021   Sepsis (HCC) 12/27/2021   Genetic testing 12/01/2021   Carcinoma of upper-outer quadrant of right breast in female, estrogen receptor positive (  HCC) 10/26/2021   Uterus, adenomyosis 05/14/2021   ASCUS of cervix with negative high risk HPV 04/23/2021   Iron deficiency anemia due to chronic blood loss 04/23/2021   Type 2 diabetes mellitus without complication, without long-term current use of insulin (HCC) 05/18/2020   RLS (restless legs syndrome) 05/14/2020   Menorrhagia with regular cycle 10/14/2017   BMI 37.0-37.9, adult 10/03/2017   History of gestational diabetes 10/03/2017    PCP: [name], NP   REFERRING  PROVIDER: [name], MD   REFERRING DIAG:  M25.611 (ICD-10-CM) - Stiffness of right shoulder, not elsewhere classified  C50.411,Z17.0 (ICD-10-CM) - Carcinoma of upper-outer quadrant of right breast in female, estrogen receptor positive    THERAPY DIAG:  Stiffness of right shoulder, not elsewhere classified  Muscle weakness (generalized)  Rationale for Evaluation and Treatment: Rehabilitation  ONSET DATE: 04/2022   SUBJECTIVE:     From Initial evaluation note 01/31/23                                                                                                                                                                                  SUBJECTIVE STATEMENT: Patient has been seeing [name], OT for lymphedema. Patient reports pain and "pulling" on posterior aspect of upper arm. Patient reports difficulty sleeping, reaching overhead, getting dressed (clasping bra), lifting heavy objects.  Patient has pulley system and red theraband. Has been completing stretches 2x/week and pulley/theraband 2-3x/week.  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 Started 6 weeks of radiation 07/2022 and currently in chemotherapy.   Patient referred to PT for R shoulder pain s/p R mastectomy/removal of expanders and radiation. Patient has seen OT for lymphedema and R shoulder ROM ~1 month for maintenance. OT has provided patient with stretches focused on pec and lat muscle tightness (shoulder abduction and flexion in supine with rotation of hips to the L), child's pose, and AAROM shoulder flexion/abduction on the table.    PAIN:  Are you having pain? Yes: NPRS scale: 2/10 - at worst 8/10 Pain location: R shoulder  Pain description: sharp, achy Aggravating factors: reaching, quick movements with arm  Relieving factors: Ice, massage ball   PRECAUTIONS:  None  RED FLAGS: None   WEIGHT BEARING RESTRICTIONS: No  FALLS:  Has patient fallen in last 6 months? 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 [place]  PLOF: Independent  PATIENT GOALS:to get this to not hurt   NEXT MD VISIT: 02/04/23 - chemo  OBJECTIVE:   DIAGNOSTIC FINDINGS:  N/A  PATIENT SURVEYS:  FOTO 103 with goal of 78  COGNITION: Overall cognitive status: Within functional limits for tasks assessed     SENSATION: WFL  POSTURE: Rounded shoulders, forward head   UPPER EXTREMITY ROM:   Active ROM Right eval Left eval  Shoulder flexion 120 WFL   Shoulder extension    Shoulder abduction 87 WFL   Shoulder adduction    Shoulder internal rotation 35 WFL  Shoulder external rotation 30 WFL   (Blank rows = not tested)  UPPER EXTREMITY MMT:  MMT Right eval Left eval  Shoulder flexion 4+ 4+  Shoulder extension    Shoulder abduction 4* 4+  Shoulder adduction    Shoulder internal rotation 4* 4+  Shoulder external rotation 4* 4+  Middle trapezius    Lower trapezius    Elbow flexion 4+ 5  Elbow extension 4+ 5  (Blank rows = not tested)  JOINT MOBILITY TESTING:  Joint stiffness noted with AP/PA and inferior mobilization   PALPATION:  Significant trigger points and taut muscle bands at pec minor, R UT, and periscapular region.    TODAY'S TREATMENT:                                                                                                                                         DATE: 04/25/23 Subjective: Pt reports she is repainting her dining room and intentionally used the roller to practice reaching up. She is trying to use her arm through her overhead motion frequently now.  She does have a jovi  pack and uses it when she feels like she needs to- mainly for sleeping at night, wears it under a camisole tank.  She could try using it more to see if it helps with the tightness under her armpit.    Pain 0/10 R shoulder at rest  Objective:  Manual Therapy: 15 min Jt mobilizations: R GH distraction, A/P and inf glides Gr III/IV in various positions of flexion, abd (pt in supine hooklying position), seated distraction with IR HBB GR III/IV  STM/TPR R latissimus dorsi, subscap, teres major, teres minor, UT, pec major/minor/anterior chest wall (in prone and supine positions)- not today  Manual pec minor stretch/scapular mob in sidelying - not today  PROM flexion, abd, ER, IR- 5-10x ea  UPPER EXTREMITY ROM:   Active ROM Right eval Left eval Right 03/23/23 Right 04/25/23  Shoulder flexion 120 WFL  140 supine 130 standing 145 standing  Shoulder extension      Shoulder abduction 87 WFL  120 supine AAROM 100 standing    Shoulder adduction      Shoulder internal rotation 35 WFL HBB thumb to L4   Shoulder external rotation 30 [facility]  55   (Blank rows = not tested)  Therapeutic Exercise: 28 min Pt education for the purpose of prolonged end range stretches for flexion, abduction to address shoulder hypomobility associated with scar tissue tightness.  Also encouraged her to continue using jovipack consistently to manage R axilla/ribcage lymphedema per her primary OT Oletta Cohn, OT).  Focus on stretches daily, and strengthening 3-4x/week.  Standing shoulder AROM- flexion, abd, IR, ER after manual therapy x10 ea  Finger ladder: x5 (able to achieve level 5 from the top)  Pulleys: R shoulder flexion, end range holds x 15 seconds  Pulleys: R shoulder abduction, end range holds x 15 seconds (discussed benefit of performing pulleys in seated position to achieve end range position vs standing which is how she initially did it before starting PT)  Reviewed wand stretches for Flexion,  abduction, 15 second holds x 10  Quadruped table top to child's pose for 3 deep breaths 5x  Counter top child's pose 5x   Not today AAROM flexion, abd with dowel x 10 each- not today Towel IR hand behind back stretch 3x 20 seconds with strap- reviewed Finger ladder: abduction direction 5x with emphasis on relaxing UT substitution- pt notes "tightness" in anterior pec mm region- not today Wall physioball roll into flexion with end range flexion stretch x 10 Doorway pec stretch R 5x 30 seconds Scapular retractions in standing: 2x10 Scapular retraction + rows: 2x10 RTB- not today Seated thoracic extension x10 over chair- not today Standing row BTB 2 x 15- HEP- HEP Standing IR with towel roll at elbow BTB 2 x 15- HEP Standing ER with towel roll at elbow RTB 2 x 15- HEP   PATIENT EDUCATION: Education details: HEP, POC, goals, self TPR techniques Person educated: Patient Education method: Explanation, Demonstration, and Handouts Education comprehension: verbalized understanding  HOME EXERCISE PROGRAM: Access Code: [email or web address] URL: [email or web address] Date: 02/23/2023 Prepared by: Max Fickle  Exercises - Pec Minor Stretch  - 2-3 x daily - 7 x weekly - 5-10 reps - 30 hold - Corner Pec Major Stretch  - 2-3 x daily - 7 x weekly - 5-10 reps - 30 hold - Supine Shoulder Flexion with Dowel  - 2-3 x daily - 7 x weekly - 10 reps - 3-5 hold - Supine Shoulder Abduction AAROM with Dowel  - 2-3 x daily - 7 x weekly - 10 reps - 3-5 hold - Shoulder Flexion Wall Slide with Towel  - 1 x daily - 7 x weekly - 5 reps - 10-20 hold - Seated Shoulder Abduction Towel Slide at Table Top  - 1 x daily - 7 x weekly - 5 sets - 5 reps - 30 hold - Doorway Pec Stretch at 60 Elevation  - 1 x daily - 7 x weekly - 5 sets - 5 reps - 30 hold - Seated Shoulder External Rotation PROM on Table  - 1 x daily - 7 x weekly - 5 sets - 5 reps - 30 hold - Standing Row with Anchored Resistance  - 1 x daily - 3  x weekly - 2 sets - 10 reps - Shoulder Internal Rotation with Resistance  - 1 x daily - 3 x weekly - 2 sets - 10 reps - Shoulder External Rotation with Anchored Resistance  - 1 x daily - 3 x weekly - 2 sets - 10 reps - Standing Shoulder Internal Rotation Stretch with Towel  - 1 x daily - 7 x weekly - 3 reps - 15 hold  ASSESSMENT:  CLINICAL IMPRESSION:   Pt's shoulder flexion AROM is improving; measured at 145 degrees today after manual therapy.  Overall, making progress with PT.  Spent extensive time discussing and coming up with a realistic HEP plan to help pt continue effectively working on end range stretches for shoulder flexion and abd at home this week.  She states the pulleys and the quadruped/child's pose sequence felt most feasible during today's session.  Wall slides and wand/dowel exercises will be the alternate option.  Planning to continue with tx 1x/week for mainly manual therapy emphasis and progressing HEP as appropriate due to anticipated long term tissue remodeling timeframe for optimal recovery/outcome with PT.  Patient will benefit from skilled PT interventions to address listed impairments to improve ADL tolerance, pain reduction, and improve pain free R shoulder ROM.   OBJECTIVE IMPAIRMENTS: decreased activity tolerance, decreased ROM, decreased strength, hypomobility, impaired flexibility, impaired UE functional use, and postural dysfunction.   ACTIVITY LIMITATIONS: carrying, lifting, bathing, dressing, reach over head, and hygiene/grooming  PARTICIPATION LIMITATIONS: laundry, shopping, community activity, occupation, and yard work  PERSONAL FACTORS: Past/current experiences, Profession, Time since onset of injury/illness/exacerbation, and 1 comorbidity: breast cancer s/p mastectomy  are also affecting patient's functional outcome.   REHAB POTENTIAL: Good  CLINICAL DECISION MAKING: Stable/uncomplicated  EVALUATION COMPLEXITY: Moderate   GOALS: Goals reviewed with  patient? Yes  SHORT TERM GOALS: Target date: 04/08/2023  Patient will be independent in HEP to improve strength/ROM for better functional independence with ADLs. Baseline: 8/26: HEP initiated ; 03/23/23: in progress, adjusting and progressing HEP as appropriate Goal status: In Progress  LONG TERM GOALS: Target date: 12/11//2024  Patient will increase FOTO score to equal to or greater than 69 to demonstrate statistically significant improvement in mobility and quality of life.  Baseline: 8/26: 54 Goal status: In Progress  2.  Patient will demonstrate adequate R shoulder ROM and strength to be able to clasp bra, dress independently, and perform overhead activities with pain less than 2/10.  Baseline: 8/26: unable to clasp bra with IR limitation; 03/23/23: in progress, able to reach thumb to L4 R; able to reach hair but not lift item >7 lbs off high shelf with R UE only Goal status: In progress  3.  Patient will improve B UE strength by 1/2 MMT grade to improve ability to complete iADLs and job related tasks.  Baseline: 8/26: see above; 03/23/23: in progress Goal status: In progress  4.  Patient will report a worst pain of 2/10 on NRPS in R shoulder to improve tolerance with ADLs and reduced symptoms with activities.  Baseline: 8/26: worst pain 8/10; 03/23/23: worst pain 4/10 Goal status: In progres  PLAN:  PT FREQUENCY: 1-2x/week  PT DURATION: 8 weeks  PLANNED INTERVENTIONS: Therapeutic exercises, Therapeutic activity, Patient/Family education, Self Care, Joint mobilization, Joint manipulation, DME instructions, Dry Needling, Spinal mobilization, Cryotherapy, Moist heat, Taping, Traction, and Manual therapy  PLAN FOR NEXT SESSION: joint mobilizations, scapulothoracic mob, continue ROM for shoulder ROM deficits; scapular retractions; continue manual therapy techniques STM/TPR; discussed touching base with [name] about her concern of swelling/lymphedema along R proximal ribcage under  axilla.   Max Fickle, PT, DPT, OCS  Physical Therapist - [facility]  04/25/2023, 2:13 PM

## 2023-05-02 ENCOUNTER — Ambulatory Visit: Payer: 59

## 2023-05-02 DIAGNOSIS — M6281 Muscle weakness (generalized): Secondary | ICD-10-CM

## 2023-05-02 DIAGNOSIS — M25611 Stiffness of right shoulder, not elsewhere classified: Secondary | ICD-10-CM | POA: Diagnosis not present

## 2023-05-02 NOTE — Therapy (Signed)
OUTPATIENT PHYSICAL THERAPY SHOULDER TREATMENT Re-certification through 05/18/23   Patient Name: Marilyn Page MRN: [id number] DOB:1975-12-26, 47 y.o., female Today's Date: 05/02/2023  END OF SESSION:  PT End of Session - 05/02/23 1420     Visit Number 15    Number of Visits 20    Date for PT Re-Evaluation 05/18/23    Authorization Type 1x/week x 8 weeks: re-cert through 05/18/23    Authorization Time Period 30 v /calendar year PT/OT/Speech    PT Start Time 1415    PT Stop Time 1500    PT Time Calculation (min) 45 min    Activity Tolerance Patient tolerated treatment well    Behavior During Therapy [facility] for tasks assessed/performed                Past Medical History:  Diagnosis Date   Anemia    Carcinoma of upper-outer quadrant of right breast in female, estrogen receptor positive (HCC) 10/26/2021   Diabetes mellitus without complication (HCC)    Family history of adverse reaction to anesthesia    grandmother had issues but doesnt know what the issue was, she was older.   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 Axilla Butterfly hydro clip-MACRO METASTATIC MAMMARY CARCINOMA   BREAST BIOPSY Right 04/21/2022   [place] RT PLC BREAST LOC DEV   1ST LESION  INC US GUIDE 04/21/2022 [facility]   BREAST RECONSTRUCTION WITH PLACEMENT OF TISSUE EXPANDER AND FLEX HD (ACELLULAR HYDRATED DERMIS) Right 04/21/2022   Procedure: RIGHT BREAST RECONSTRUCTION WITH PLACEMENT OF TISSUE EXPANDER AND FLEX HD (ACELLULAR HYDRATED DERMIS);  Surgeon: Peggye Form, DO;  Location: [facility];  Service: Plastics;  Laterality: Right;   CESAREAN SECTION   2005/2008   DILATION AND CURETTAGE OF UTERUS  2003   PORTACATH PLACEMENT Left 11/09/2021   Procedure: INSERTION PORT-A-CATH;  Surgeon: Earline Mayotte, MD;  Location: [facility];  Service: General;  Laterality: Left;   REMOVAL OF TISSUE EXPANDER AND PLACEMENT OF IMPLANT Right 06/08/2022   Procedure: REMOVAL OF TISSUE EXPANDER;  Surgeon: Peggye Form, DO;  Location: [facility];  Service: Plastics;  Laterality: Right;   SIMPLE MASTECTOMY WITH AXILLARY SENTINEL NODE BIOPSY Right 04/21/2022   Procedure: SIMPLE MASTECTOMY WITH AXILLARY SENTINEL NODE BIOPSY;  Surgeon: Earline Mayotte, MD;  Location: [facility];  Service: General;  Laterality: Right;  wire localization of lymph node   SKIN BIOPSY Right 11/09/2021   Procedure: PUNCH BIOPSY SKIN, TATTOO LYMPH NODE RIGHT AXILLA;  Surgeon: Earline Mayotte, MD;  Location: [facility];  Service: General;  Laterality: Right;   WISDOM TOOTH EXTRACTION     Patient Active Problem List   Diagnosis Date Noted   Acquired absence of right breast 06/18/2022   Low vitamin B12 level 05/24/2022   Hypomagnesemia 05/16/2022   Lactic acid acidosis 03/06/2022   Fever and chills 03/05/2022   COVID-19 virus infection 12/28/2021   Thrombocytopenia (HCC) 12/28/2021   Diabetes mellitus (HCC) 12/28/2021   Febrile neutropenia (HCC) 12/27/2021   Sepsis (HCC) 12/27/2021   Genetic testing 12/01/2021   Carcinoma of upper-outer quadrant of right breast in female, estrogen receptor positive (  HCC) 10/26/2021   Uterus, adenomyosis 05/14/2021   ASCUS of cervix with negative high risk HPV 04/23/2021   Iron deficiency anemia due to chronic blood loss 04/23/2021   Type 2 diabetes mellitus without complication, without long-term current use of insulin (HCC) 05/18/2020   RLS (restless legs syndrome) 05/14/2020   Menorrhagia with regular cycle 10/14/2017   BMI 37.0-37.9, adult 10/03/2017   History of gestational diabetes 10/03/2017    PCP: [name], NP   REFERRING  PROVIDER: [name], MD   REFERRING DIAG:  M25.611 (ICD-10-CM) - Stiffness of right shoulder, not elsewhere classified  C50.411,Z17.0 (ICD-10-CM) - Carcinoma of upper-outer quadrant of right breast in female, estrogen receptor positive    THERAPY DIAG:  Stiffness of right shoulder, not elsewhere classified  Muscle weakness (generalized)  Rationale for Evaluation and Treatment: Rehabilitation  ONSET DATE: 04/2022   SUBJECTIVE:     From Initial evaluation note 01/31/23                                                                                                                                                                                  SUBJECTIVE STATEMENT: Patient has been seeing [name], OT for lymphedema. Patient reports pain and "pulling" on posterior aspect of upper arm. Patient reports difficulty sleeping, reaching overhead, getting dressed (clasping bra), lifting heavy objects.  Patient has pulley system and red theraband. Has been completing stretches 2x/week and pulley/theraband 2-3x/week.  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 Started 6 weeks of radiation 07/2022 and currently in chemotherapy.   Patient referred to PT for R shoulder pain s/p R mastectomy/removal of expanders and radiation. Patient has seen OT for lymphedema and R shoulder ROM ~1 month for maintenance. OT has provided patient with stretches focused on pec and lat muscle tightness (shoulder abduction and flexion in supine with rotation of hips to the L), child's pose, and AAROM shoulder flexion/abduction on the table.    PAIN:  Are you having pain? Yes: NPRS scale: 2/10 - at worst 8/10 Pain location: R shoulder  Pain description: sharp, achy Aggravating factors: reaching, quick movements with arm  Relieving factors: Ice, massage ball   PRECAUTIONS:  None  RED FLAGS: None   WEIGHT BEARING RESTRICTIONS: No  FALLS:  Has patient fallen in last 6 months? 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 [place]  PLOF: Independent  PATIENT GOALS:to get this to not hurt   NEXT MD VISIT: 02/04/23 - chemo  OBJECTIVE:   DIAGNOSTIC FINDINGS:  N/A  PATIENT SURVEYS:  FOTO 76 with goal of 52  COGNITION: Overall cognitive status: Within functional limits for tasks assessed     SENSATION: WFL  POSTURE: Rounded shoulders, forward head   UPPER EXTREMITY ROM:   Active ROM Right eval Left eval  Shoulder flexion 120 WFL   Shoulder extension    Shoulder abduction 87 WFL   Shoulder adduction    Shoulder internal rotation 35 WFL  Shoulder external rotation 30 WFL   (Blank rows = not tested)  UPPER EXTREMITY MMT:  MMT Right eval Left eval  Shoulder flexion 4+ 4+  Shoulder extension    Shoulder abduction 4* 4+  Shoulder adduction    Shoulder internal rotation 4* 4+  Shoulder external rotation 4* 4+  Middle trapezius    Lower trapezius    Elbow flexion 4+ 5  Elbow extension 4+ 5  (Blank rows = not tested)  JOINT MOBILITY TESTING:  Joint stiffness noted with AP/PA and inferior mobilization   PALPATION:  Significant trigger points and taut muscle bands at pec minor, R UT, and periscapular region.    TODAY'S TREATMENT:                                                                                                                                         DATE: 05/02/23 Subjective: Pt reports she did some more painting this week; she did not try the pulleys again; mostly working on the bands and stick HEP.      Pain 0/10 R shoulder at rest  Objective:  Manual Therapy: 30  min Jt mobilizations: R GH distraction, A/P and inf glides Gr III/IV in various positions of flexion, abd (pt in supine hooklying position), seated distraction with IR HBB GR III/IV  STM/TPR R latissimus dorsi, subscap, teres major, teres minor, UT, pec major/minor/anterior chest wall (in prone and supine positions)  Manual pec minor stretch/scapular mob in sidelying  PROM flexion, abd, ER, IR- 5-10x ea  UPPER EXTREMITY ROM:   Active ROM Right eval Left eval Right 03/23/23 Right 04/25/23  Shoulder flexion 120 WFL  140 supine 130 standing 145 standing  Shoulder extension      Shoulder abduction 87 WFL  120 supine AAROM 100 standing    Shoulder adduction      Shoulder internal rotation 35 WFL HBB thumb to L4   Shoulder external rotation 30 WFL  55   (Blank rows = not tested)  Therapeutic Exercise: 15 min UBE: 3.5 min fwd/3.5 rev.  Wall finger ladder: x3  Pt education for the purpose of prolonged end range stretches for flexion, abduction to address shoulder hypomobility associated with scar tissue tightness.  Also encouraged her to continue using jovipack  consistently to manage R axilla/ribcage lymphedema per her primary OT Oletta Cohn, OT).  Focus on stretches daily, and strengthening 3-4x/week.  Standing shoulder AROM- flexion, abd, IR, ER after manual therapy x10 ea  Counter top child's pose 5x  Standing row BTB 2 x 15 with scapular retraction Standing IR with towel roll at elbow BTB 2 x 15 Standing ER with towel roll at elbow RTB 2 x 15 Standing shoulder extension with scapular retraction BTB 2x15 Standing b/l ER BTB 2x10  Not today AAROM flexion, abd with dowel x 10 each- not today Towel IR hand behind back stretch 3x 20 seconds with strap- reviewed Finger ladder: abduction direction 5x with emphasis on relaxing UT substitution- pt notes "tightness" in anterior pec mm region- not today Wall physioball roll into flexion with end range flexion stretch x  10 Doorway pec stretch R 5x 30 seconds Scapular retractions in standing: 2x10 Scapular retraction + rows: 2x10 RTB- not today Seated thoracic extension x10 over chair- not toda  PATIENT EDUCATION: Education details: HEP, POC, goals, self TPR techniques Person educated: Patient Education method: Explanation, Demonstration, and Handouts Education comprehension: verbalized understanding  HOME EXERCISE PROGRAM: Access Code: [email or web address] URL: [email or web address] Date: 02/23/2023 Prepared by: Max Fickle  Exercises - Pec Minor Stretch  - 2-3 x daily - 7 x weekly - 5-10 reps - 30 hold - Corner Pec Major Stretch  - 2-3 x daily - 7 x weekly - 5-10 reps - 30 hold - Supine Shoulder Flexion with Dowel  - 2-3 x daily - 7 x weekly - 10 reps - 3-5 hold - Supine Shoulder Abduction AAROM with Dowel  - 2-3 x daily - 7 x weekly - 10 reps - 3-5 hold - Shoulder Flexion Wall Slide with Towel  - 1 x daily - 7 x weekly - 5 reps - 10-20 hold - Seated Shoulder Abduction Towel Slide at Table Top  - 1 x daily - 7 x weekly - 5 sets - 5 reps - 30 hold - Doorway Pec Stretch at 60 Elevation  - 1 x daily - 7 x weekly - 5 sets - 5 reps - 30 hold - Seated Shoulder External Rotation PROM on Table  - 1 x daily - 7 x weekly - 5 sets - 5 reps - 30 hold - Standing Row with Anchored Resistance  - 1 x daily - 3 x weekly - 2 sets - 10 reps - Shoulder Internal Rotation with Resistance  - 1 x daily - 3 x weekly - 2 sets - 10 reps - Shoulder External Rotation with Anchored Resistance  - 1 x daily - 3 x weekly - 2 sets - 10 reps - Standing Shoulder Internal Rotation Stretch with Towel  - 1 x daily - 7 x weekly - 3 reps - 15 hold  ASSESSMENT:  CLINICAL IMPRESSION:   Focused tx today on manual therapy to address ROM and soft tissue restrictions in anterior/inferior shoulder region.  Reviewed the importance of continuing with HEP consistently for optimal long term PT plan.  Observed pt able to perform scapular mm  retraining with good form today which is also part of her HEP 3x/week.  Planning to continue with tx 1x/week for mainly manual therapy emphasis and progressing HEP as appropriate due to anticipated long term tissue remodeling timeframe for optimal recovery/outcome with PT.  Patient will benefit from skilled PT interventions to address listed impairments to improve ADL tolerance, pain reduction, and improve pain free R shoulder ROM.  OBJECTIVE IMPAIRMENTS: decreased activity tolerance, decreased ROM, decreased strength, hypomobility, impaired flexibility, impaired UE functional use, and postural dysfunction.   ACTIVITY LIMITATIONS: carrying, lifting, bathing, dressing, reach over head, and hygiene/grooming  PARTICIPATION LIMITATIONS: laundry, shopping, community activity, occupation, and yard work  PERSONAL FACTORS: Past/current experiences, Profession, Time since onset of injury/illness/exacerbation, and 1 comorbidity: breast cancer s/p mastectomy  are also affecting patient's functional outcome.   REHAB POTENTIAL: Good  CLINICAL DECISION MAKING: Stable/uncomplicated  EVALUATION COMPLEXITY: Moderate   GOALS: Goals reviewed with patient? Yes  SHORT TERM GOALS: Target date: 04/08/2023  Patient will be independent in HEP to improve strength/ROM for better functional independence with ADLs. Baseline: 8/26: HEP initiated ; 03/23/23: in progress, adjusting and progressing HEP as appropriate Goal status: In Progress  LONG TERM GOALS: Target date: 12/11//2024  Patient will increase FOTO score to equal to or greater than 69 to demonstrate statistically significant improvement in mobility and quality of life.  Baseline: 8/26: 54 Goal status: In Progress  2.  Patient will demonstrate adequate R shoulder ROM and strength to be able to clasp bra, dress independently, and perform overhead activities with pain less than 2/10.  Baseline: 8/26: unable to clasp bra with IR limitation; 03/23/23: in  progress, able to reach thumb to L4 R; able to reach hair but not lift item >7 lbs off high shelf with R UE only Goal status: In progress  3.  Patient will improve B UE strength by 1/2 MMT grade to improve ability to complete iADLs and job related tasks.  Baseline: 8/26: see above; 03/23/23: in progress Goal status: In progress  4.  Patient will report a worst pain of 2/10 on NRPS in R shoulder to improve tolerance with ADLs and reduced symptoms with activities.  Baseline: 8/26: worst pain 8/10; 03/23/23: worst pain 4/10 Goal status: In progres  PLAN:  PT FREQUENCY: 1-2x/week  PT DURATION: 8 weeks  PLANNED INTERVENTIONS: Therapeutic exercises, Therapeutic activity, Patient/Family education, Self Care, Joint mobilization, Joint manipulation, DME instructions, Dry Needling, Spinal mobilization, Cryotherapy, Moist heat, Taping, Traction, and Manual therapy  PLAN FOR NEXT SESSION: joint mobilizations, scapulothoracic mob, continue ROM for shoulder ROM deficits; scapular retractions; continue manual therapy techniques STM/TPR; discussed touching base with [name] about her concern of swelling/lymphedema along R proximal ribcage under axilla.   Max Fickle, PT, DPT, OCS  Physical Therapist - [facility]  05/02/2023, 2:21 PM

## 2023-05-10 ENCOUNTER — Ambulatory Visit: Payer: 59 | Admitting: Plastic Surgery

## 2023-05-11 ENCOUNTER — Encounter: Payer: Self-pay | Admitting: Internal Medicine

## 2023-05-13 ENCOUNTER — Ambulatory Visit: Payer: 59 | Admitting: Internal Medicine

## 2023-05-13 ENCOUNTER — Encounter: Payer: Self-pay | Admitting: Internal Medicine

## 2023-05-13 ENCOUNTER — Ambulatory Visit: Payer: 59

## 2023-05-13 ENCOUNTER — Other Ambulatory Visit: Payer: 59

## 2023-05-13 ENCOUNTER — Inpatient Hospital Stay: Payer: 59

## 2023-05-13 ENCOUNTER — Inpatient Hospital Stay: Payer: 59 | Admitting: Internal Medicine

## 2023-05-13 ENCOUNTER — Inpatient Hospital Stay: Payer: 59 | Attending: Internal Medicine

## 2023-05-13 VITALS — BP 112/60 | HR 80 | Temp 97.8°F | Ht 66.0 in | Wt 208.0 lb

## 2023-05-13 DIAGNOSIS — E876 Hypokalemia: Secondary | ICD-10-CM | POA: Insufficient documentation

## 2023-05-13 DIAGNOSIS — Z170 Estrogen receptor positive status [ER+]: Secondary | ICD-10-CM

## 2023-05-13 DIAGNOSIS — Z79810 Long term (current) use of selective estrogen receptor modulators (SERMs): Secondary | ICD-10-CM | POA: Insufficient documentation

## 2023-05-13 DIAGNOSIS — D500 Iron deficiency anemia secondary to blood loss (chronic): Secondary | ICD-10-CM

## 2023-05-13 DIAGNOSIS — D696 Thrombocytopenia, unspecified: Secondary | ICD-10-CM | POA: Insufficient documentation

## 2023-05-13 DIAGNOSIS — C773 Secondary and unspecified malignant neoplasm of axilla and upper limb lymph nodes: Secondary | ICD-10-CM | POA: Insufficient documentation

## 2023-05-13 DIAGNOSIS — C50411 Malignant neoplasm of upper-outer quadrant of right female breast: Secondary | ICD-10-CM

## 2023-05-13 DIAGNOSIS — C50211 Malignant neoplasm of upper-inner quadrant of right female breast: Secondary | ICD-10-CM | POA: Insufficient documentation

## 2023-05-13 LAB — CMP (CANCER CENTER ONLY)
ALT: 20 U/L (ref 0–44)
AST: 31 U/L (ref 15–41)
Albumin: 3.1 g/dL — ABNORMAL LOW (ref 3.5–5.0)
Alkaline Phosphatase: 47 U/L (ref 38–126)
Anion gap: 8 (ref 5–15)
BUN: 13 mg/dL (ref 6–20)
CO2: 26 mmol/L (ref 22–32)
Calcium: 8.9 mg/dL (ref 8.9–10.3)
Chloride: 106 mmol/L (ref 98–111)
Creatinine: 0.51 mg/dL (ref 0.44–1.00)
GFR, Estimated: >60 mL/min (ref >60)
Glucose, Bld: 178 mg/dL — ABNORMAL HIGH (ref 70–99)
Potassium: 3.2 mmol/L — ABNORMAL LOW (ref 3.5–5.1)
Sodium: 140 mmol/L (ref 135–145)
Total Bilirubin: 0.4 mg/dL (ref <1.2)
Total Protein: 6.6 g/dL (ref 6.5–8.1)

## 2023-05-13 LAB — CBC WITH DIFFERENTIAL (CANCER CENTER ONLY)
Abs Immature Granulocytes: 0.01 10*3/uL (ref 0.00–0.07)
Basophils Absolute: 0 10*3/uL (ref 0.0–0.1)
Basophils Relative: 1 %
Eosinophils Absolute: 0.1 10*3/uL (ref 0.0–0.5)
Eosinophils Relative: 2 %
HCT: 33.2 % — ABNORMAL LOW (ref 36.0–46.0)
Hemoglobin: 10.9 g/dL — ABNORMAL LOW (ref 12.0–15.0)
Immature Granulocytes: 0 %
Lymphocytes Relative: 28 %
Lymphs Abs: 1.3 10*3/uL (ref 0.7–4.0)
MCH: 28.5 pg (ref 26.0–34.0)
MCHC: 32.8 g/dL (ref 30.0–36.0)
MCV: 86.9 fL (ref 80.0–100.0)
Monocytes Absolute: 0.3 10*3/uL (ref 0.1–1.0)
Monocytes Relative: 7 %
Neutro Abs: 2.8 10*3/uL (ref 1.7–7.7)
Neutrophils Relative %: 62 %
Platelet Count: 86 10*3/uL — ABNORMAL LOW (ref 150–400)
RBC: 3.82 MIL/uL — ABNORMAL LOW (ref 3.87–5.11)
RDW: 16 % — ABNORMAL HIGH (ref 11.5–15.5)
WBC Count: 4.5 10*3/uL (ref 4.0–10.5)
nRBC: 0 % (ref 0.0–0.2)

## 2023-05-13 LAB — IRON AND TIBC
Iron: 66 ug/dL (ref 28–170)
Saturation Ratios: 14 % (ref 10.4–31.8)
TIBC: 482 ug/dL — ABNORMAL HIGH (ref 250–450)
UIBC: 416 ug/dL

## 2023-05-13 LAB — FERRITIN: Ferritin: 36 ng/mL (ref 11–307)

## 2023-05-13 MED ORDER — HEPARIN SOD (PORK) LOCK FLUSH 100 UNIT/ML IV SOLN
500.0000 [IU] | Freq: Once | INTRAVENOUS | Status: AC
  Administered 2023-05-13: 500 [IU] via INTRAVENOUS
  Filled 2023-05-13: qty 5

## 2023-05-13 MED ORDER — LEUPROLIDE ACETATE (3 MONTH) 11.25 MG IM KIT
11.2500 mg | PACK | Freq: Once | INTRAMUSCULAR | Status: AC
  Administered 2023-05-13: 11.25 mg via INTRAMUSCULAR
  Filled 2023-05-13: qty 11.25

## 2023-05-13 NOTE — Assessment & Plan Note (Addendum)
#   MAY 2023- STAGE III-T4N1 ER/PR positive HER2 POSITIVE RIGHT breast cancer; LN-positive-ER/PR positive her-2 NEGATIVE; # s/p neoadjuvant chemotherapy- TCH plus P x 6 cycles- s/p Right mastectomy-[15th, NOv 2023] ypT2 [85mm]; ypN0.  Given partial response/involvement of the breast skin/multifocal disease- s/p RT- 08/27/2022. atient currently on tamoxifen [DEC-JAN 2024]; + OFS- Lupron every 3 months ]. ON Ado-trastuzumab emtansine  for 14 cycles.   # s/p Ado-trastuzumab emtansine #14 of planned 14 cycles.  Left mammogram- wnl.  Stable. oct 18th, 2024- E cho- 60 to 65%. Will plan imaging CT imaging in 3 months.  # Mild Thrombocytopenia- >90  monitor for now. Stable.   # Allergic reaction to Ado-trastuzumab emtansine-fever/rash- currently on Claritin x3 days postchemotherapy; Tylenol preemptively.  Decreased Benadryl 25 mg given drowsiness. Stable.    # Hypocalcemia-  ON adjuvant Zometa q 6 months x 3 years [first- May-17th, 2024].   Continue ca+Vit D BID. Vit D FEB 2024- 54; [albumin 3.0]-  Stable.  # Right sided UE swelling/tightness- s/p  Maureen-  Stable.  # Infusion reaction - ZOmeta [flu like symtoms]- imprived with NSAIDs-  Stable.  # Diabetes- BG 148 [Fasting]- recently increased Farxiga 10 mg- [OFF ozempic/metformin- intolerance]-  Stable.  # Hypokalemia: continue Kdur BID- Stable.  # PV access: port functional- # port/IV access- Stable; discussed re: pro and cons of keeping the port vs. Explantation. Await until next visit/scan- flush q 6- 8 weeks for now.   Lupron 05/13/2023  q 35M; Zometa [over 30 mins] q 6 M- #1 10/22/22   # DISPOSITION: # Lupron # port flush in 6 weeks # follow up in  3 months- MD;  port- labs; cbc/cmp; Lupron - CT CAP prior-Dr.B

## 2023-05-13 NOTE — Progress Notes (Signed)
Should pt start back on her iron?  Having leg cramps about every night.

## 2023-05-13 NOTE — Progress Notes (Signed)
[facility] CONSULT NOTE  [name], [name], [name], [name]  CHIEF COMPLAINTS/PURPOSE OF CONSULTATION: Breast cancer  Oncology History Overview Note  MAY 2023-    Targeted ultrasound is performed, showing a 2.9 x 1.9 x 2.0 cm irregular hypoechoic mass right breast 10 o'clock position 6 cm from nipple at the site of palpable concern.   There is an abnormal 8 mm thickened right axillary lymph node.   There is skin thickening overlying the right breast. ------------------  A. BREAST, RIGHT AT 10:00, 6 CM FROM THE NIPPLE; ULTRASOUND-GUIDED CORE  NEEDLE BIOPSY:  - INVASIVE MAMMARY CARCINOMA, NO SPECIAL TYPE.   Size of invasive carcinoma: 11 mm in this sample  Histologic grade of invasive carcinoma: Grade 2                       Glandular/tubular differentiation score: 3                       Nuclear pleomorphism score: 2                       Mitotic rate score: 1                       Total score: 6  Ductal carcinoma in situ: Present, intermediate grade  Lymphovascular invasion: Not identified   B. LYMPH NODE, RIGHT AXILLARY; ULTRASOUND-GUIDED CORE NEEDLE BIOPSY:  - 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 IN GREATEST  EXTENT.   Comment:  The malignancy in the primary breast lesion appears morphologically  different from tumor in the lymph node sampling, with tumor present in  lymph node more suggestive of a histologic grade 3, with significantly  increased mitotic activity and pleomorphism. Due to the discrepancy in  these morphologic patterns, ER/PR/HER2 immunohistochemistry will be  performed on both blocks A1 and B1, with reflex to FISH for HER2 2+. The  results will be reported in an addendum.   ADDENDUM:  CASE SUMMARY: BREAST BIOMARKER TESTS - A - BREAST, RIGHT AT 10:00  Estrogen Receptor  (ER) Status: POSITIVE          Percentage of cells with nuclear positivity: 71-80%          Average intensity of staining: Strong   Progesterone Receptor (PgR) Status: POSITIVE          Percentage of cells with nuclear positivity: 81-90%          Average intensity of staining: Strong   HER2 (by immunohistochemistry): POSITIVE (Score 3+)       Percentage of cells with uniform intense complete membrane  staining: 61-70%  HER2 displays a heterogeneous staining pattern, with areas showing a  strong 3+ staining pattern, and other regions with complete absence (0+)  of staining.   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  Discussed with Dr. [name] tumor heterogeneity. BREAST MRI- JUNE 2023- The patient's known malignancy in the upper outer quadrant of the right breast measures 3.8 x 2.8 x 3.6 cm. Numerous other suspicious masses are identified in the right breast as above involving the upper outer and upper inner quadrants, located both anterior and posterior to the known malignancy.  Numerous enhancing foci in the skin as above are worrisome for skin metastases ; Bx proven. Biopsy proven metastatic node in the right axilla.  No evidence of malignancy in the left breast;  Two mildly prominent right internal mammary nodes were not FDG avid on recent PET-CT imaging.  Currently on neoadjuvant chemotherapy- TCH  plus P x6 cycles;  MUGA scan May 31st, 2023- -61%.    # June 7th, 2023- NEO-ADJUVANT CHEMO- TCH+P x6 cycles- s/p Mastectomy [NOV 15th, 2023] [Dr.Byrnett]-  ypT2 [58mm]; ypN0.  # JAN mid, 2024 -TAMOXFEN 20 mg a day; ADD LupronFEB 2nd.2 2024 s/p RT maech, 22nd- 2024  # FEB 2nd, 2023- Kadcyla q 3 W x14 cycles;   # Lupron q3 M [/start 2/07-2022]   # Genetic counseling s/p- NEG for any deleterious mutations.     Carcinoma of upper-outer quadrant of right breast in female, estrogen receptor positive (HCC)  10/26/2021 Initial Diagnosis   Carcinoma of upper-outer quadrant of right breast in female, estrogen receptor positive (HCC)   10/26/2021 Cancer Staging   Staging form: Breast, AJCC 8th Edition - Clinical: Stage IIIA (cT4d, cN1, cM0, G2, ER+, PR+, HER2+) - Signed by [name], MD on 06/04/2022 Histologic grading system: 3 grade system   11/10/2021 - 03/01/2022 Chemotherapy   Patient is on Treatment Plan : BREAST  Docetaxel + Carboplatin + Trastuzumab + Pertuzumab  (TCHP) q21d      11/11/2021 - 01/13/2022 Chemotherapy   Patient is on Treatment Plan : BREAST  Docetaxel + Carboplatin + Trastuzumab + Pertuzumab  (TCHP) q21d       Genetic Testing   Negative genetic testing. No pathogenic variants identified on the [facility] CancerNext-Expanded+RNA panel. The report date is 11/29/2021.  The CancerNext-Expanded + RNAinsight gene panel offered by [employer] and includes sequencing and rearrangement analysis for the following 77 genes: IP, ALK, APC*, ATM*, AXIN2, BAP1, BARD1, BLM, BMPR1A, BRCA1*, BRCA2*, BRIP1*, CDC73, CDH1*,CDK4, CDKN1B, CDKN2A, CHEK2*, CTNNA1, DICER1, FANCC, FH, FLCN, GALNT12, KIF1B, LZTR1, MAX, MEN1, MET, MLH1*, MSH2*, MSH3, MSH6*, MUTYH*, NBN, NF1*, NF2, NTHL1, PALB2*, PHOX2B, PMS2*, POT1, PRKAR1A, PTCH1, PTEN*, RAD51C*, RAD51D*,RB1, RECQL, RET, SDHA, SDHAF2, SDHB, SDHC, SDHD, SMAD4, SMARCA4, SMARCB1, SMARCE1, STK11, SUFU, TMEM127, TP53*,TSC1, TSC2, VHL and XRCC2 (sequencing  and deletion/duplication); EGFR, EGLN1, HOXB13, KIT, MITF, PDGFRA, POLD1 and POLE (sequencing only); EPCAM and GREM1 (deletion/duplication only).   07/09/2022 -  Chemotherapy   Patient is on Treatment Plan : BREAST ADO-Trastuzumab Emtansine (Kadcyla) q21d      HISTORY OF PRESENTING ILLNESS: Patient is ambulating independently.  Alone.   Marilyn Page 47 y.o.  female right breast TRIPLE POSITIVE with T4- Stage III -s/p mastectomy-with expanders-currently on adjuvant kadcyla / tamoxifen + OFS  is here for follow-up.    Patient states that she is doing well, and she doesn't;t have any new questions or concerns today.   Continues to have mild tightness of right chest  wall. Started on PT with [facility]. Patient states she is compliant with therapy oral Potassium.  Mild  neuropathy.   Review of Systems  Constitutional:  Positive for malaise/fatigue. Negative for chills, diaphoresis, fever and weight loss.  HENT:  Negative for nosebleeds and sore throat.   Eyes:  Negative for double vision.  Respiratory:  Negative for cough, hemoptysis, sputum production, shortness of breath and wheezing.   Cardiovascular:  Negative for chest pain, palpitations, orthopnea and leg swelling.  Gastrointestinal:  Negative for abdominal pain, blood in stool, constipation, diarrhea, heartburn, melena, nausea and vomiting.  Genitourinary:  Negative for dysuria, frequency and urgency.  Musculoskeletal:  Positive for myalgias. Negative for back pain and joint pain.  Skin: Negative.  Negative for itching and rash.  Neurological:  Negative for dizziness, tingling, focal weakness, weakness and headaches.  Endo/Heme/Allergies:  Does not bruise/bleed easily.  Psychiatric/Behavioral:  Negative for depression. The patient is not nervous/anxious and does not have insomnia.      MEDICAL HISTORY:  Past Medical History:  Diagnosis Date   Anemia    Carcinoma of upper-outer quadrant of right breast in female, estrogen receptor  positive (HCC) 10/26/2021   Diabetes mellitus without complication (HCC)    Family history of adverse reaction to anesthesia    grandmother had issues but doesnt know what the issue was, she was older.   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 Axilla Butterfly hydro clip-MACRO METASTATIC MAMMARY CARCINOMA   BREAST BIOPSY Right 04/21/2022   [place] RT PLC BREAST LOC DEV   1ST LESION  INC US GUIDE 04/21/2022 [facility]   BREAST RECONSTRUCTION WITH PLACEMENT OF TISSUE EXPANDER AND FLEX HD (ACELLULAR HYDRATED DERMIS) Right 04/21/2022   Procedure: RIGHT BREAST RECONSTRUCTION WITH PLACEMENT OF TISSUE EXPANDER AND FLEX HD (ACELLULAR HYDRATED DERMIS);  Surgeon: Peggye Form, DO;  Location: [facility] ORS;  Service: Plastics;  Laterality: Right;   CESAREAN SECTION  2005/2008   DILATION AND CURETTAGE OF UTERUS  2003   PORTACATH PLACEMENT Left 11/09/2021   Procedure: INSERTION PORT-A-CATH;  Surgeon: Earline Mayotte, MD;  Location: [facility] ORS;  Service: General;  Laterality: Left;   REMOVAL OF TISSUE EXPANDER AND PLACEMENT OF IMPLANT Right 06/08/2022   Procedure: REMOVAL OF TISSUE EXPANDER;  Surgeon: Peggye Form, DO;  Location: [facility] OR;  Service: Plastics;  Laterality: Right;   SIMPLE MASTECTOMY WITH AXILLARY SENTINEL NODE BIOPSY Right 04/21/2022   Procedure: SIMPLE MASTECTOMY WITH AXILLARY SENTINEL NODE BIOPSY;  Surgeon: Earline Mayotte, MD;  Location: [facility];  Service: General;  Laterality: Right;  wire localization of lymph node   SKIN BIOPSY Right 11/09/2021   Procedure: PUNCH BIOPSY SKIN, TATTOO LYMPH NODE RIGHT AXILLA;  Surgeon:  Earline Mayotte, MD;  Location: [facility] ORS;  Service: General;  Laterality: Right;   WISDOM TOOTH EXTRACTION      SOCIAL HISTORY: Social History   Socioeconomic History   Marital status: Married    Spouse name: Fraser Din   Number of children: 2   Years of education: Not on file   Highest education level: Not on file  Occupational History   Occupation:  and teacher's assistant  Tobacco Use   Smoking status: Never   Smokeless tobacco: Never  Vaping Use   Vaping status: Never Used  Substance and Sexual Activity   Alcohol use: Never   Drug use: Never   Sexual activity: Yes    Birth control/protection: Other-see comments, Surgical    Comment: vasectomy  Other Topics Concern   Not on file  Social History Narrative   Pre-school teacher; in [place]; 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    Social Determinants of Health   Financial Resource Strain: Not on file  Food Insecurity: Not on file  Transportation Needs: Not on file  Physical Activity: Inactive (09/26/2017)   Exercise Vital Sign    Days of Exercise per Week: 0 days    Minutes of Exercise per Session: 0 min  Stress: Not on file  Social Connections: Not on file  Intimate Partner Violence: Not on file    FAMILY HISTORY: Family History  Problem Relation Age of Onset   Ovarian cysts Mother    Migraines Mother    Melanoma Mother        on leg   Hyperlipidemia Father    Diabetes Maternal Aunt    Stomach cancer Paternal Aunt    Diabetes Maternal Grandfather    Lymphoma Cousin 17   Breast cancer Neg Hx     ALLERGIES:  is allergic to metformin and oxycodone.  MEDICATIONS:  Current Outpatient Medications  Medication Sig Dispense Refill   acetaminophen (TYLENOL) 325 MG tablet Take 2 tablets (650 mg total) by mouth every 6 (six) hours as needed for mild pain (or Fever >/= 101).     ascorbic acid (VITAMIN C) 500 MG tablet Take 1 tablet (500 mg total) by mouth daily. 30 tablet    calcium-vitamin D (OSCAL WITH D) 500-5 MG-MCG tablet  Take 1 tablet by mouth 2 (two) times daily. 60 tablet 2   Cyanocobalamin (VITAMIN B-12 PO) Take 1 tablet by mouth daily.     Dapagliflozin Propanediol (FARXIGA PO) Take 10 mg by mouth daily.     lidocaine-prilocaine (EMLA) cream Apply on the port. 30 -45 min  prior to port access. 30 g 3   loratadine (CLARITIN) 10 MG tablet Take 10 mg by mouth as needed for allergies. Day before tx, day of and day after.     Magnesium Cl-Calcium Carbonate (SLOW MAGNESIUM/CALCIUM) 70-117 MG TBEC Take 1 tablet by mouth 2 (two) times daily. 60 tablet 6   Multiple Vitamin (MULTIVITAMIN WITH MINERALS) TABS tablet Take 1 tablet by mouth daily.     ondansetron (ZOFRAN) 4 MG tablet Take 1 tablet (4 mg total) by mouth every 8 (eight) hours as needed for up to 20 doses for nausea or vomiting. 20 tablet 0   ondansetron (ZOFRAN-ODT) 8 MG disintegrating tablet Take 8 mg by mouth every 8 (eight) hours as needed for nausea or vomiting.     ONETOUCH VERIO test strip SMARTSIG:1 Each Via Meter Daily PRN     pioglitazone (ACTOS) 15 MG tablet Take 15 mg by mouth daily.     potassium chloride (KLOR-CON) 20 MEQ packet TAKE 20 MEQ BY MOUTH 2 (TWO) TIMES DAILY. 180 packet 1   prochlorperazine (COMPAZINE) 10 MG tablet Take 1 tablet (10 mg total) by mouth every 6 (six) hours as needed for nausea or vomiting. 40 tablet 1   tamoxifen (NOLVADEX) 20 MG tablet TAKE 1 TABLET BY MOUTH EVERY DAY 90 tablet 1   ferrous sulfate 325 (65 FE) MG tablet Take  325 mg by mouth 2 (two) times daily with a meal. (Patient not taking: Reported on 04/06/2023)     No current facility-administered medications for this visit.   Facility-Administered Medications Ordered in Other Visits  Medication Dose Route Frequency Provider Last Rate Last Admin   leuprolide (LUPRON) injection 11.25 mg  11.25 mg Intramuscular Once Louretta Shorten R, MD          .  PHYSICAL EXAMINATION: ECOG PERFORMANCE STATUS: 0 - Asymptomatic  Vitals:   05/13/23 1330  BP: 112/60   Pulse: 80  Temp: 97.8 F (36.6 C)  SpO2: 100%         Filed Weights   05/13/23 1330  Weight: 208 lb (94.3 kg)    Physical Exam Vitals and nursing note reviewed.  HENT:     Head: Normocephalic and atraumatic.     Mouth/Throat:     Pharynx: Oropharynx is clear.  Eyes:     Extraocular Movements: Extraocular movements intact.     Pupils: Pupils are equal, round, and reactive to light.  Cardiovascular:     Rate and Rhythm: Normal rate and regular rhythm.  Pulmonary:     Comments: Decreased breath sounds bilaterally.  Abdominal:     Palpations: Abdomen is soft.  Musculoskeletal:        General: Normal range of motion.     Cervical back: Normal range of motion.  Skin:    General: Skin is warm.  Neurological:     General: No focal deficit present.     Mental Status: She is alert and oriented to person, place, and time.  Psychiatric:        Behavior: Behavior normal.        Judgment: Judgment normal.      LABORATORY DATA:  I have reviewed the data as listed Lab Results  Component Value Date   WBC 4.5 05/13/2023   HGB 10.9 (L) 05/13/2023   HCT 33.2 (L) 05/13/2023   MCV 86.9 05/13/2023   PLT 86 (L) 05/13/2023   Recent Labs    03/18/23 1328 04/08/23 0831 05/13/23 1339  NA 137 138 140  K 3.0* 3.4* 3.2*  CL 105 105 106  CO2 26 25 26   GLUCOSE 173* 184* 178*  BUN 12 11 13   CREATININE 0.64 0.60 0.51  CALCIUM 8.8* 8.6* 8.9  GFRNONAA >60 >60 >60  PROT 6.9 6.6 6.6  ALBUMIN 3.2* 3.0* 3.1*  AST 37 37 31  ALT 25 24 20   ALKPHOS 50 53 47  BILITOT 0.7 0.6 0.4    RADIOGRAPHIC STUDIES: I have personally reviewed the radiological images as listed and agreed with the findings in the report. No results found.  ASSESSMENT & PLAN:   Carcinoma of upper-outer quadrant of right breast in female, estrogen receptor positive (HCC) # MAY 2023- STAGE III-T4N1 ER/PR positive HER2 POSITIVE RIGHT breast cancer; LN-positive-ER/PR positive her-2 NEGATIVE; # s/p neoadjuvant  chemotherapy- TCH plus P x 6 cycles- s/p Right mastectomy-[15th, NOv 2023] ypT2 [27mm]; ypN0.  Given partial response/involvement of the breast skin/multifocal disease- s/p RT- 08/27/2022. atient currently on tamoxifen [DEC-JAN 2024]; + OFS- Lupron every 3 months ]. ON Ado-trastuzumab emtansine  for 14 cycles.   # s/p Ado-trastuzumab emtansine #14 of planned 14 cycles.  Left mammogram- wnl.  Stable. oct 18th, 2024- E cho- 60 to 65%. Will plan imaging CT imaging in 3 months.  # Mild Thrombocytopenia- >90  monitor for now. Stable.   # Allergic reaction to Ado-trastuzumab emtansine-fever/rash- currently on Claritin x3  days postchemotherapy; Tylenol preemptively.  Decreased Benadryl 25 mg given drowsiness. Stable.    # Hypocalcemia-  ON adjuvant Zometa q 6 months x 3 years [first- May-17th, 2024].   Continue ca+Vit D BID. Vit D FEB 2024- 54; [albumin 3.0]-  Stable.  # Right sided UE swelling/tightness- s/p  Maureen-  Stable.  # Infusion reaction - ZOmeta [flu like symtoms]- imprived with NSAIDs-  Stable.  # Diabetes- BG 148 [Fasting]- recently increased Farxiga 10 mg- [OFF ozempic/metformin- intolerance]-  Stable.  # Hypokalemia: continue Kdur BID- Stable.  # PV access: port functional- # port/IV access- Stable; discussed re: pro and cons of keeping the port vs. Explantation. Await until next visit/scan- flush q 6- 8 weeks for now.   Lupron 05/13/2023  q 71M; Zometa [over 30 mins] q 6 M- #1 10/22/22   # DISPOSITION: # Lupron # port flush in 6 weeks # follow up in  3 months- MD;  port- labs; cbc/cmp; Lupron - CT CAP prior-Dr.B     All questions were answered. The patient/family knows to call the clinic with any problems, questions or concerns.     [name], MD 05/13/2023 2:50 PM

## 2023-05-14 ENCOUNTER — Other Ambulatory Visit: Payer: Self-pay

## 2023-05-15 ENCOUNTER — Other Ambulatory Visit: Payer: Self-pay

## 2023-05-16 ENCOUNTER — Encounter: Payer: Self-pay | Admitting: *Deleted

## 2023-05-16 ENCOUNTER — Ambulatory Visit: Payer: 59 | Attending: Internal Medicine

## 2023-05-16 DIAGNOSIS — M25611 Stiffness of right shoulder, not elsewhere classified: Secondary | ICD-10-CM | POA: Diagnosis present

## 2023-05-16 DIAGNOSIS — M6281 Muscle weakness (generalized): Secondary | ICD-10-CM | POA: Diagnosis present

## 2023-05-16 NOTE — Therapy (Signed)
OUTPATIENT PHYSICAL THERAPY SHOULDER TREATMENT Re-certification through 05/18/23   Patient Name: Marilyn Page MRN: [id number] DOB:June 25, 1975, 47 y.o., female Today's Date: 05/16/2023  END OF SESSION:  PT End of Session - 05/16/23 1503     Visit Number 16    Number of Visits 20    Date for PT Re-Evaluation 05/18/23    Authorization Type 1x/week x 8 weeks: re-cert through 05/18/23    Authorization Time Period 30 v /calendar year PT/OT/Speech    PT Start Time 1500    PT Stop Time 1545    PT Time Calculation (min) 45 min    Activity Tolerance Patient tolerated treatment well    Behavior During Therapy [facility] for tasks assessed/performed                Past Medical History:  Diagnosis Date   Anemia    Carcinoma of upper-outer quadrant of right breast in female, estrogen receptor positive (HCC) 10/26/2021   Diabetes mellitus without complication (HCC)    Family history of adverse reaction to anesthesia    grandmother had issues but doesnt know what the issue was, she was older.   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 Axilla Butterfly hydro clip-MACRO METASTATIC MAMMARY CARCINOMA   BREAST BIOPSY Right 04/21/2022   [place] RT PLC BREAST LOC DEV   1ST LESION  INC US GUIDE 04/21/2022 [facility]   BREAST RECONSTRUCTION WITH PLACEMENT OF TISSUE EXPANDER AND FLEX HD (ACELLULAR HYDRATED DERMIS) Right 04/21/2022   Procedure: RIGHT BREAST RECONSTRUCTION WITH PLACEMENT OF TISSUE EXPANDER AND FLEX HD (ACELLULAR HYDRATED DERMIS);  Surgeon: Peggye Form, DO;  Location: [facility];  Service: Plastics;  Laterality: Right;   CESAREAN SECTION   2005/2008   DILATION AND CURETTAGE OF UTERUS  2003   PORTACATH PLACEMENT Left 11/09/2021   Procedure: INSERTION PORT-A-CATH;  Surgeon: Earline Mayotte, MD;  Location: [facility];  Service: General;  Laterality: Left;   REMOVAL OF TISSUE EXPANDER AND PLACEMENT OF IMPLANT Right 06/08/2022   Procedure: REMOVAL OF TISSUE EXPANDER;  Surgeon: Peggye Form, DO;  Location: [facility];  Service: Plastics;  Laterality: Right;   SIMPLE MASTECTOMY WITH AXILLARY SENTINEL NODE BIOPSY Right 04/21/2022   Procedure: SIMPLE MASTECTOMY WITH AXILLARY SENTINEL NODE BIOPSY;  Surgeon: Earline Mayotte, MD;  Location: [facility];  Service: General;  Laterality: Right;  wire localization of lymph node   SKIN BIOPSY Right 11/09/2021   Procedure: PUNCH BIOPSY SKIN, TATTOO LYMPH NODE RIGHT AXILLA;  Surgeon: Earline Mayotte, MD;  Location: [facility];  Service: General;  Laterality: Right;   WISDOM TOOTH EXTRACTION     Patient Active Problem List   Diagnosis Date Noted   Acquired absence of right breast 06/18/2022   Low vitamin B12 level 05/24/2022   Hypomagnesemia 05/16/2022   Lactic acid acidosis 03/06/2022   Fever and chills 03/05/2022   COVID-19 virus infection 12/28/2021   Thrombocytopenia (HCC) 12/28/2021   Diabetes mellitus (HCC) 12/28/2021   Febrile neutropenia (HCC) 12/27/2021   Sepsis (HCC) 12/27/2021   Genetic testing 12/01/2021   Carcinoma of upper-outer quadrant of right breast in female, estrogen receptor positive (  HCC) 10/26/2021   Uterus, adenomyosis 05/14/2021   ASCUS of cervix with negative high risk HPV 04/23/2021   Iron deficiency anemia due to chronic blood loss 04/23/2021   Type 2 diabetes mellitus without complication, without long-term current use of insulin (HCC) 05/18/2020   RLS (restless legs syndrome) 05/14/2020   Menorrhagia with regular cycle 10/14/2017   BMI 37.0-37.9, adult 10/03/2017   History of gestational diabetes 10/03/2017    PCP: [name], NP   REFERRING  PROVIDER: [name], MD   REFERRING DIAG:  M25.611 (ICD-10-CM) - Stiffness of right shoulder, not elsewhere classified  C50.411,Z17.0 (ICD-10-CM) - Carcinoma of upper-outer quadrant of right breast in female, estrogen receptor positive    THERAPY DIAG:  Stiffness of right shoulder, not elsewhere classified  Muscle weakness (generalized)  Rationale for Evaluation and Treatment: Rehabilitation  ONSET DATE: 04/2022   SUBJECTIVE:     From Initial evaluation note 01/31/23                                                                                                                                                                                  SUBJECTIVE STATEMENT: Patient has been seeing [name], OT for lymphedema. Patient reports pain and "pulling" on posterior aspect of upper arm. Patient reports difficulty sleeping, reaching overhead, getting dressed (clasping bra), lifting heavy objects.  Patient has pulley system and red theraband. Has been completing stretches 2x/week and pulley/theraband 2-3x/week.  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 Started 6 weeks of radiation 07/2022 and currently in chemotherapy.   Patient referred to PT for R shoulder pain s/p R mastectomy/removal of expanders and radiation. Patient has seen OT for lymphedema and R shoulder ROM ~1 month for maintenance. OT has provided patient with stretches focused on pec and lat muscle tightness (shoulder abduction and flexion in supine with rotation of hips to the L), child's pose, and AAROM shoulder flexion/abduction on the table.    PAIN:  Are you having pain? Yes: NPRS scale: 2/10 - at worst 8/10 Pain location: R shoulder  Pain description: sharp, achy Aggravating factors: reaching, quick movements with arm  Relieving factors: Ice, massage ball   PRECAUTIONS:  None  RED FLAGS: None   WEIGHT BEARING RESTRICTIONS: No  FALLS:  Has patient fallen in last 6 months? 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 [place]  PLOF: Independent  PATIENT GOALS:to get this to not hurt   NEXT MD VISIT: 02/04/23 - chemo  OBJECTIVE:   DIAGNOSTIC FINDINGS:  N/A  PATIENT SURVEYS:  FOTO 66 with goal of 28  COGNITION: Overall cognitive status: Within functional limits for tasks assessed     SENSATION: WFL  POSTURE: Rounded shoulders, forward head   UPPER EXTREMITY ROM:   Active ROM Right eval Left eval  Shoulder flexion 120 WFL   Shoulder extension    Shoulder abduction 87 WFL   Shoulder adduction    Shoulder internal rotation 35 WFL  Shoulder external rotation 30 WFL   (Blank rows = not tested)  UPPER EXTREMITY MMT:  MMT Right eval Left eval  Shoulder flexion 4+ 4+  Shoulder extension    Shoulder abduction 4* 4+  Shoulder adduction    Shoulder internal rotation 4* 4+  Shoulder external rotation 4* 4+  Middle trapezius    Lower trapezius    Elbow flexion 4+ 5  Elbow extension 4+ 5  (Blank rows = not tested)  JOINT MOBILITY TESTING:  Joint stiffness noted with AP/PA and inferior mobilization   PALPATION:  Significant trigger points and taut muscle bands at pec minor, R UT, and periscapular region.    TODAY'S TREATMENT:                                                                                                                                         DATE: 05/16/23 Subjective: Pt reports she has been trying to work on her HEP; and trying to use her R arm for daily activities too.  She notices the area under her armpit feels sore/pinchy this week,     Pain 0/10 R shoulder  at rest  Objective:  Manual Therapy: 15 min Jt mobilizations: R GH distraction, A/P and inf glides Gr III/IV in various positions of flexion, abd (pt in supine hooklying position), seated distraction with IR HBB GR III/IV- not today  STM/TPR- focused on R serratus ant, lats, teres major (in sidelying today) with R arm propped in abd  Manual pec minor stretch/scapular mob in sidelying  Scapular mobs- all directions after STM x10 ea  PROM flexion, abd, ER, IR- 5-10x ea  UPPER EXTREMITY ROM:   Active ROM Right eval Left eval Right 03/23/23 Right 04/25/23  Shoulder flexion 120 WFL  140 supine 130 standing 145 standing  Shoulder extension      Shoulder abduction 87 WFL  120 supine AAROM 100 standing    Shoulder adduction      Shoulder internal rotation 35 WFL HBB thumb to L4   Shoulder external rotation 30 WFL  55   (Blank rows = not tested)  Therapeutic Exercise: 15 min UBE: 4 min fwd/4 rev.  Wall finger ladder: x3  Pt education for the purpose of prolonged end range stretches for  flexion, abduction to address shoulder hypomobility associated with scar tissue tightness.  Also encouraged her to continue using jovipack consistently to manage R axilla/ribcage lymphedema per her primary OT Oletta Cohn, OT).  Focus on stretches daily, and strengthening 3-4x/week.  Standing shoulder AROM- flexion, abd, IR, ER after manual therapy x2 ea  Counter top child's pose 5x Bicep curl: 3# 2x15 Shoulder flexion/scaption: 2# 2x12 Shoulder abduction: 2# x12, x10 Chest press: 3#  2x12  Standing row BTB 2 x 15 with scapular retraction- not today Standing IR with towel roll at elbow BTB 2 x 15- not today Standing ER with towel roll at elbow RTB 2 x 15- not today Standing shoulder extension with scapular retraction BTB 2x15- not today Standing b/l ER BTB 2x10- not today  Not today AAROM flexion, abd with dowel x 10 each- not today Towel IR hand behind back stretch 3x 20 seconds  with strap- reviewed Finger ladder: abduction direction 5x with emphasis on relaxing UT substitution- pt notes "tightness" in anterior pec mm region- not today Wall physioball roll into flexion with end range flexion stretch x 10 Doorway pec stretch R 5x 30 seconds Scapular retractions in standing: 2x10 Scapular retraction + rows: 2x10 RTB- not today Seated thoracic extension x10 over chair- not toda  PATIENT EDUCATION: Education details: HEP, POC, goals, self TPR techniques Person educated: Patient Education method: Explanation, Demonstration, and Handouts Education comprehension: verbalized understanding  HOME EXERCISE PROGRAM: Access Code: [email or web address] URL: [email or web address] Date: 02/23/2023 Prepared by: Max Fickle  Exercises - Pec Minor Stretch  - 2-3 x daily - 7 x weekly - 5-10 reps - 30 hold - Corner Pec Major Stretch  - 2-3 x daily - 7 x weekly - 5-10 reps - 30 hold - Supine Shoulder Flexion with Dowel  - 2-3 x daily - 7 x weekly - 10 reps - 3-5 hold - Supine Shoulder Abduction AAROM with Dowel  - 2-3 x daily - 7 x weekly - 10 reps - 3-5 hold - Shoulder Flexion Wall Slide with Towel  - 1 x daily - 7 x weekly - 5 reps - 10-20 hold - Seated Shoulder Abduction Towel Slide at Table Top  - 1 x daily - 7 x weekly - 5 sets - 5 reps - 30 hold - Doorway Pec Stretch at 60 Elevation  - 1 x daily - 7 x weekly - 5 sets - 5 reps - 30 hold - Seated Shoulder External Rotation PROM on Table  - 1 x daily - 7 x weekly - 5 sets - 5 reps - 30 hold - Standing Row with Anchored Resistance  - 1 x daily - 3 x weekly - 2 sets - 10 reps - Shoulder Internal Rotation with Resistance  - 1 x daily - 3 x weekly - 2 sets - 10 reps - Shoulder External Rotation with Anchored Resistance  - 1 x daily - 3 x weekly - 2 sets - 10 reps - Standing Shoulder Internal Rotation Stretch with Towel  - 1 x daily - 7 x weekly - 3 reps - 15 hold  ASSESSMENT:  CLINICAL IMPRESSION:   Focused tx today on  manual therapy to address ROM and soft tissue restrictions in posterior/inferior shoulder region.  Reviewed the importance of continuing with HEP consistently for optimal long term PT plan.  Able to progress shoulder strengthening today and pt was challenged appropriately.  Planning to continue with tx 1x/week for mainly manual therapy emphasis and progressing HEP as appropriate due to  anticipated long term tissue remodeling timeframe for optimal recovery/outcome with PT.  Patient will benefit from skilled PT interventions to address listed impairments to improve ADL tolerance, pain reduction, and improve pain free R shoulder ROM.   OBJECTIVE IMPAIRMENTS: decreased activity tolerance, decreased ROM, decreased strength, hypomobility, impaired flexibility, impaired UE functional use, and postural dysfunction.   ACTIVITY LIMITATIONS: carrying, lifting, bathing, dressing, reach over head, and hygiene/grooming  PARTICIPATION LIMITATIONS: laundry, shopping, community activity, occupation, and yard work  PERSONAL FACTORS: Past/current experiences, Profession, Time since onset of injury/illness/exacerbation, and 1 comorbidity: breast cancer s/p mastectomy  are also affecting patient's functional outcome.   REHAB POTENTIAL: Good  CLINICAL DECISION MAKING: Stable/uncomplicated  EVALUATION COMPLEXITY: Moderate   GOALS: Goals reviewed with patient? Yes  SHORT TERM GOALS: Target date: 04/08/2023  Patient will be independent in HEP to improve strength/ROM for better functional independence with ADLs. Baseline: 8/26: HEP initiated ; 03/23/23: in progress, adjusting and progressing HEP as appropriate Goal status: In Progress  LONG TERM GOALS: Target date: 12/11//2024  Patient will increase FOTO score to equal to or greater than 69 to demonstrate statistically significant improvement in mobility and quality of life.  Baseline: 8/26: 54 Goal status: In Progress  2.  Patient will demonstrate adequate R  shoulder ROM and strength to be able to clasp bra, dress independently, and perform overhead activities with pain less than 2/10.  Baseline: 8/26: unable to clasp bra with IR limitation; 03/23/23: in progress, able to reach thumb to L4 R; able to reach hair but not lift item >7 lbs off high shelf with R UE only Goal status: In progress  3.  Patient will improve B UE strength by 1/2 MMT grade to improve ability to complete iADLs and job related tasks.  Baseline: 8/26: see above; 03/23/23: in progress Goal status: In progress  4.  Patient will report a worst pain of 2/10 on NRPS in R shoulder to improve tolerance with ADLs and reduced symptoms with activities.  Baseline: 8/26: worst pain 8/10; 03/23/23: worst pain 4/10 Goal status: In progres  PLAN:  PT FREQUENCY: 1-2x/week  PT DURATION: 8 weeks  PLANNED INTERVENTIONS: Therapeutic exercises, Therapeutic activity, Patient/Family education, Self Care, Joint mobilization, Joint manipulation, DME instructions, Dry Needling, Spinal mobilization, Cryotherapy, Moist heat, Taping, Traction, and Manual therapy  PLAN FOR NEXT SESSION: joint mobilizations, scapulothoracic mob, continue ROM for shoulder ROM deficits; scapular retractions; continue manual therapy techniques STM/TPR; discussed touching base with [name] about her concern of swelling/lymphedema along R proximal ribcage under axilla. RE-CERT/PROGRESS NOTE NEEDED AT NEXT VISIT   Max Fickle, PT, DPT, OCS  Physical Therapist - [facility]  05/16/2023, 3:04 PM

## 2023-05-23 ENCOUNTER — Ambulatory Visit: Payer: 59

## 2023-05-23 DIAGNOSIS — M25611 Stiffness of right shoulder, not elsewhere classified: Secondary | ICD-10-CM | POA: Diagnosis not present

## 2023-05-23 DIAGNOSIS — M6281 Muscle weakness (generalized): Secondary | ICD-10-CM

## 2023-05-23 NOTE — Therapy (Addendum)
OUTPATIENT PHYSICAL THERAPY SHOULDER TREATMENT Progress Note/Re-certification through 06/20/23   Patient Name: Marilyn Page MRN: [id number] DOB:14-Dec-1975, 47 y.o., female Today's Date: 05/23/2023  END OF SESSION:  PT End of Session - 05/23/23 1544     Visit Number 17    Number of Visits 24    Date for PT Re-Evaluation 06/20/23    Authorization Type 1x/week x 8 weeks: re-cert through 05/18/23 (PN/recert done today x4 more weeks 06/20/23)    Authorization Time Period 30 v /calendar year PT/OT/Speech    PT Start Time 1545    PT Stop Time 1645    PT Time Calculation (min) 60 min    Activity Tolerance Patient tolerated treatment well    Behavior During Therapy [facility] for tasks assessed/performed                Past Medical History:  Diagnosis Date   Anemia    Carcinoma of upper-outer quadrant of right breast in female, estrogen receptor positive (HCC) 10/26/2021   Diabetes mellitus without complication (HCC)    Family history of adverse reaction to anesthesia    grandmother had issues but doesnt know what the issue was, she was older.   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 Axilla Butterfly hydro clip-MACRO METASTATIC MAMMARY CARCINOMA   BREAST BIOPSY Right 04/21/2022   [place] RT PLC BREAST LOC DEV   1ST LESION  INC US GUIDE 04/21/2022 [facility]   BREAST RECONSTRUCTION WITH PLACEMENT OF TISSUE EXPANDER AND FLEX HD (ACELLULAR HYDRATED DERMIS) Right 04/21/2022   Procedure: RIGHT BREAST RECONSTRUCTION WITH PLACEMENT OF TISSUE EXPANDER AND FLEX HD (ACELLULAR HYDRATED DERMIS);  Surgeon: Marilyn Form, DO;  Location: [facility] ORS;   Service: Plastics;  Laterality: Right;   CESAREAN SECTION  2005/2008   DILATION AND CURETTAGE OF UTERUS  2003   PORTACATH PLACEMENT Left 11/09/2021   Procedure: INSERTION PORT-A-CATH;  Surgeon: Marilyn Mayotte, MD;  Location: [facility] OR[facility];  Service: General;  Laterality: Left;   REMOVAL OF TISSUE EXPANDER AND PLACEMENT OF IMPLANT Right 06/08/2022   Procedure: REMOVAL OF TISSUE EXPANDER;  Surgeon: Marilyn Form, DO;  Location: [facility];  Service: Plastics;  Laterality: Right;   SIMPLE MASTECTOMY WITH AXILLARY SENTINEL NODE BIOPSY Right 04/21/2022   Procedure: SIMPLE MASTECTOMY WITH AXILLARY SENTINEL NODE BIOPSY;  Surgeon: Marilyn Mayotte, MD;  Location: [facility] ORS;  Service: General;  Laterality: Right;  wire localization of lymph node   SKIN BIOPSY Right 11/09/2021   Procedure: PUNCH BIOPSY SKIN, TATTOO LYMPH NODE RIGHT AXILLA;  Surgeon: Marilyn Mayotte, MD;  Location: [facility] OR[facility];  Service: General;  Laterality: Right;   WISDOM TOOTH EXTRACTION     Patient Active Problem List   Diagnosis Date Noted   Acquired absence of right breast 06/18/2022   Low vitamin B12 level 05/24/2022   Hypomagnesemia 05/16/2022   Lactic acid acidosis 03/06/2022   Fever and chills 03/05/2022   COVID-19 virus infection 12/28/2021   Thrombocytopenia (HCC) 12/28/2021   Diabetes mellitus (HCC) 12/28/2021   Febrile neutropenia (HCC) 12/27/2021   Sepsis (HCC) 12/27/2021   Genetic testing 12/01/2021   Carcinoma of upper-outer quadrant  of right breast in female, estrogen receptor positive (HCC) 10/26/2021   Uterus, adenomyosis 05/14/2021   ASCUS of cervix with negative high risk HPV 04/23/2021   Iron deficiency anemia due to chronic blood loss 04/23/2021   Type 2 diabetes mellitus without complication, without long-term current use of insulin (HCC) 05/18/2020   RLS (restless legs syndrome) 05/14/2020   Menorrhagia with regular cycle 10/14/2017   BMI 37.0-37.9, adult 10/03/2017   History of gestational diabetes  10/03/2017    PCP: Marilyn Axe, NP   REFERRING PROVIDER: [name], MD   REFERRING DIAG:  M25.611 (ICD-10-CM) - Stiffness of right shoulder, not elsewhere classified  C50.411,Z17.0 (ICD-10-CM) - Carcinoma of upper-outer quadrant of right breast in female, estrogen receptor positive    THERAPY DIAG:  Stiffness of right shoulder, not elsewhere classified  Muscle weakness (generalized)  Rationale for Evaluation and Treatment: Rehabilitation  ONSET DATE: 04/2022   SUBJECTIVE:     From Initial evaluation note 01/31/23                                                                                                                                                                                  SUBJECTIVE STATEMENT: Patient has been seeing Marilyn Page, OT for lymphedema. Patient reports pain and "pulling" on posterior aspect of upper arm. Patient reports difficulty sleeping, reaching overhead, getting dressed (clasping bra), lifting heavy objects.  Patient has pulley system and red theraband. Has been completing stretches 2x/week and pulley/theraband 2-3x/week.  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 Started 6 weeks of radiation 07/2022 and currently in chemotherapy.   Patient referred to PT for R shoulder pain s/p R mastectomy/removal of expanders and radiation. Patient has seen OT for lymphedema and R shoulder ROM ~1 month for maintenance. OT has provided patient with stretches focused on pec and lat muscle tightness (shoulder abduction and flexion in supine with rotation of hips to the L), child's pose, and AAROM shoulder flexion/abduction on the table.    PAIN:  Are you having pain? Yes: NPRS scale: 2/10 - at worst 8/10 Pain location: R shoulder  Pain description: sharp, achy Aggravating factors: reaching, quick movements with arm   Relieving factors: Ice, massage ball   PRECAUTIONS: None  RED FLAGS: None   WEIGHT BEARING RESTRICTIONS: No  FALLS:  Has patient fallen in last 6 months? 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 [place]  PLOF: Independent  PATIENT GOALS:to get this to not hurt   NEXT MD VISIT: 02/04/23 - chemo  OBJECTIVE:   DIAGNOSTIC FINDINGS:  N/A  PATIENT SURVEYS:  FOTO 40 with goal of 73  COGNITION: Overall cognitive status: Within functional limits for tasks assessed     SENSATION: WFL  POSTURE: Rounded shoulders, forward head   UPPER EXTREMITY ROM:   Active ROM Right eval Left eval  Shoulder flexion 120 WFL   Shoulder extension    Shoulder abduction 87 WFL   Shoulder adduction    Shoulder internal rotation 35 WFL  Shoulder external rotation 30 WFL   (Blank rows = not tested)  UPPER EXTREMITY MMT:  MMT Right eval Left eval  Shoulder flexion 4+ 4+  Shoulder extension    Shoulder abduction 4* 4+  Shoulder adduction    Shoulder internal rotation 4* 4+  Shoulder external rotation 4* 4+  Middle trapezius    Lower trapezius    Elbow flexion 4+ 5  Elbow extension 4+ 5  (Blank rows = not tested)  JOINT MOBILITY TESTING:  Joint stiffness noted with AP/PA and inferior mobilization   PALPATION:  Significant trigger points and taut muscle bands at pec minor, R UT, and periscapular region.    TODAY'S TREATMENT:                                                                                                                                         DATE: 05/23/23 Subjective: Pt reports she isn't feeling as much of the pinchy sensation in her axilla since last session; she has been working on the weights at home.   She continues to use her arm for daily activities.      Pain 0/10 R shoulder at rest upon arrival  Objective:  Manual Therapy: 25 min Jt mobilizations: R GH distraction, A/P and inf glides Gr III/IV in various positions of flexion, abd (pt in supine hooklying position), seated distraction with IR HBB GR III/IV- not today  STM/TPR- focused on R serratus ant, lats, teres major, inferior axilla soft tissue (in sidelying today) with R arm propped in abd  Manual pec minor stretch/scapular mob in sidelying  Scapular mobs- all directions after STM x10 ea  PROM flexion, abd, ER, IR- 5-10x ea  UPPER EXTREMITY ROM:   Active ROM Right eval Left eval Right 03/23/23 Right 04/25/23 R 05/23/23  Shoulder flexion 120 WFL  140 supine 130 standing 145 standing 145 standing 152 standing  Shoulder extension       Shoulder abduction 87 WFL  120 supine AAROM 100 standing   130 standing   Shoulder adduction       Shoulder internal rotation 35 WFL HBB thumb to L4  HBB thumb to L2  Shoulder external rotation 30 WFL  55  45  deg  (Blank rows = not tested) Shoulder strength: R IR 5/5, ER 4+/5, Flexion 4+/5 Therapeutic Exercise: 30 min UBE: 3 min fwd/3 rev.  Standing shoulder AROM- flexion, abd, IR, ER after manual therapy x2 ea  Counter top child's pose 5x Bicep curl: 3# 2x15 Shoulder flexion/scaption: 2# 2x12 Shoulder abduction: 2# x12, x10 Chest press: 3#  2x12 Standing row BTB 2 x 15 with scapular retraction  Standing IR with towel roll at elbow BTB 2 x 15- not today Standing ER with towel roll at elbow RTB 2 x 15- not today Standing shoulder extension with scapular retraction BTB 2x15- not today Standing b/l ER BTB 2x10- not today  Pt education for the purpose of prolonged end range stretches for flexion, abduction to address shoulder hypomobility associated with scar tissue tightness.  Also encouraged her to continue using jovipack consistently to manage R axilla/ribcage lymphedema  per her primary OT Oletta Cohn, OT).  Focus on stretches daily, and strengthening 3-4x/week.  Not today AAROM flexion, abd with dowel x 10 each- not today Towel IR hand behind back stretch 3x 20 seconds with strap- reviewed Finger ladder: abduction direction 5x with emphasis on relaxing UT substitution- pt notes "tightness" in anterior pec mm region- not today Wall physioball roll into flexion with end range flexion stretch x 10 Doorway pec stretch R 5x 30 seconds Scapular retractions in standing: 2x10 Scapular retraction + rows: 2x10 RTB- not today Seated thoracic extension x10 over chair- not toda  PATIENT EDUCATION: Education details: HEP, POC, goals, self TPR techniques Person educated: Patient Education method: Explanation, Demonstration, and Handouts Education comprehension: verbalized understanding  HOME EXERCISE PROGRAM: Access Code: [email or web address] URL: [email or web address] Date: 02/23/2023 Prepared by: Max Fickle  Exercises - Pec Minor Stretch  - 2-3 x daily - 7 x weekly - 5-10 reps - 30 hold - Corner Pec Major Stretch  - 2-3 x daily - 7 x weekly - 5-10 reps - 30 hold - Supine Shoulder Flexion with Dowel  - 2-3 x daily - 7 x weekly - 10 reps - 3-5 hold - Supine Shoulder Abduction AAROM with Dowel  - 2-3 x daily - 7 x weekly - 10 reps - 3-5 hold - Shoulder Flexion Wall Slide with Towel  - 1 x daily - 7 x weekly - 5 reps - 10-20 hold - Seated Shoulder Abduction Towel Slide at Table Top  - 1 x daily - 7 x weekly - 5 sets - 5 reps - 30 hold - Doorway Pec Stretch at 60 Elevation  - 1 x daily - 7 x weekly - 5 sets - 5 reps - 30 hold - Seated Shoulder External Rotation PROM on Table  - 1 x daily - 7 x weekly - 5 sets - 5 reps - 30 hold - Standing Row with Anchored Resistance  - 1 x daily - 3 x weekly - 2 sets - 10 reps - Shoulder Internal Rotation with Resistance  - 1 x daily - 3 x weekly - 2 sets - 10 reps - Shoulder External Rotation with Anchored Resistance   - 1 x daily - 3 x weekly - 2 sets - 10 reps - Standing Shoulder Internal Rotation Stretch with Towel  - 1 x daily - 7 x weekly - 3 reps - 15 hold  ASSESSMENT:  CLINICAL IMPRESSION:   Focused tx today on manual therapy to address ROM and soft tissue restrictions in posterior/inferior shoulder region and shoulder strengthening exercises to promote ability to use R UE  through full available ROM.  Updated goals today and obtained objective ROM measurements.  Pt's shoulder AROM is improving in all directions with PT tx.  Reviewed the importance of continuing with HEP consistently for optimal long term PT plan.  Planning to continue with tx 1x/week for mainly manual therapy emphasis and progressing HEP as appropriate due to anticipated long term tissue remodeling timeframe for optimal recovery/outcome with PT.  Patient will benefit from skilled PT interventions to address listed impairments to improve ADL tolerance, pain reduction, and improve pain free R shoulder ROM.   OBJECTIVE IMPAIRMENTS: decreased activity tolerance, decreased ROM, decreased strength, hypomobility, impaired flexibility, impaired UE functional use, and postural dysfunction.   ACTIVITY LIMITATIONS: carrying, lifting, bathing, dressing, reach over head, and hygiene/grooming  PARTICIPATION LIMITATIONS: laundry, shopping, community activity, occupation, and yard work  PERSONAL FACTORS: Past/current experiences, Profession, Time since onset of injury/illness/exacerbation, and 1 comorbidity: breast cancer s/p mastectomy  are also affecting patient's functional outcome.   REHAB POTENTIAL: Good  CLINICAL DECISION MAKING: Stable/uncomplicated  EVALUATION COMPLEXITY: Moderate   GOALS: Goals reviewed with patient? Yes  SHORT TERM GOALS: Target date: 06/20/2023  Patient will be independent in HEP to improve strength/ROM for better functional independence with ADLs. Baseline: 8/26: HEP initiated ; 03/23/23: in progress, adjusting and  progressing HEP as appropriate; 06/02/23: HEP has been updated to include strengthenintg Goal status: In Progress  LONG TERM GOALS: Target date: 06/20/2023  Patient will increase FOTO score to equal to or greater than 69 to demonstrate statistically significant improvement in mobility and quality of life.  Baseline: 8/26: 54; 05/23/23: in progress Goal status: In Progress  2.  Patient will demonstrate adequate R shoulder ROM and strength to be able to clasp bra, dress independently, and perform overhead activities with pain less than 2/10.  Baseline: 8/26: unable to clasp bra with IR limitation; 03/23/23: in progress, able to reach thumb to L4 R; able to reach hair but not lift item >7 lbs off high shelf with R UE only; 05/23/23: R shoulder flexion AROM 145 deg today/PROM 152 deg (>150 deg actively is the goal for functional overhead reach Goal status: In progress  3.  Patient will improve B UE strength by 1/2 MMT grade to improve ability to complete iADLs and job related tasks.  Baseline: 8/26: see above; 03/23/23: in progress; 05/23/23: in progress R shoulder IR 5/5, others 4+/5 Goal status: In progress  4.  Patient will report a worst pain of 2/10 on NRPS in R shoulder to improve tolerance with ADLs and reduced symptoms with activities.  Baseline: 8/26: worst pain 8/10; 03/23/23: worst pain 4/10; 05/23/23 3/10 worst Goal status: In progress  PLAN:  PT FREQUENCY: 1-2x/week  PT DURATION: 8 weeks  PLANNED INTERVENTIONS: Therapeutic exercises, Therapeutic activity, Patient/Family education, Self Care, Joint mobilization, Joint manipulation, DME instructions, Dry Needling, Spinal mobilization, Cryotherapy, Moist heat, Taping, Traction, and Manual therapy  PLAN FOR NEXT SESSION: joint mobilizations, scapulothoracic mob, continue ROM for shoulder ROM deficits; scapular retractions; continue manual therapy techniques STM/TPR;    Max Fickle, PT, DPT, OCS  Physical Therapist - [facility]  05/23/2023, 5:05 PM

## 2023-05-30 ENCOUNTER — Ambulatory Visit: Payer: 59

## 2023-05-30 DIAGNOSIS — M6281 Muscle weakness (generalized): Secondary | ICD-10-CM

## 2023-05-30 DIAGNOSIS — M25611 Stiffness of right shoulder, not elsewhere classified: Secondary | ICD-10-CM | POA: Diagnosis not present

## 2023-05-30 NOTE — Therapy (Signed)
OUTPATIENT PHYSICAL THERAPY SHOULDER TREATMENT Progress Note/Re-certification through 06/20/23   Patient Name: Marilyn Page MRN: [id number] DOB:1976/01/23, 47 y.o., female Today's Date: 05/30/2023  END OF SESSION:  PT End of Session - 05/30/23 1125     Visit Number 18    Number of Visits 24    Date for PT Re-Evaluation 06/20/23    Authorization Type 1x/week x 8 weeks: re-cert through 05/18/23 (PN/recert done today x4 more weeks 06/20/23)    Authorization Time Period 30 v /calendar year PT/OT/Speech    PT Start Time 1115    PT Stop Time 1200    PT Time Calculation (min) 45 min    Activity Tolerance Patient tolerated treatment well    Behavior During Therapy [facility] for tasks assessed/performed                Past Medical History:  Diagnosis Date   Anemia    Carcinoma of upper-outer quadrant of right breast in female, estrogen receptor positive (HCC) 10/26/2021   Diabetes mellitus without complication (HCC)    Family history of adverse reaction to anesthesia    grandmother had issues but doesnt know what the issue was, she was older.   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 Axilla Butterfly hydro clip-MACRO METASTATIC MAMMARY CARCINOMA   BREAST BIOPSY Right 04/21/2022   [place] RT PLC BREAST LOC DEV   1ST LESION  INC US GUIDE 04/21/2022 [facility]   BREAST RECONSTRUCTION WITH PLACEMENT OF TISSUE EXPANDER AND FLEX HD (ACELLULAR HYDRATED DERMIS) Right 04/21/2022   Procedure: RIGHT BREAST RECONSTRUCTION WITH PLACEMENT OF TISSUE EXPANDER AND FLEX HD (ACELLULAR HYDRATED DERMIS);  Surgeon: Peggye Form, DO;  Location: [facility] ORS;   Service: Plastics;  Laterality: Right;   CESAREAN SECTION  2005/2008   DILATION AND CURETTAGE OF UTERUS  2003   PORTACATH PLACEMENT Left 11/09/2021   Procedure: INSERTION PORT-A-CATH;  Surgeon: Earline Mayotte, MD;  Location: [facility];  Service: General;  Laterality: Left;   REMOVAL OF TISSUE EXPANDER AND PLACEMENT OF IMPLANT Right 06/08/2022   Procedure: REMOVAL OF TISSUE EXPANDER;  Surgeon: Peggye Form, DO;  Location: [facility] OR;  Service: Plastics;  Laterality: Right;   SIMPLE MASTECTOMY WITH AXILLARY SENTINEL NODE BIOPSY Right 04/21/2022   Procedure: SIMPLE MASTECTOMY WITH AXILLARY SENTINEL NODE BIOPSY;  Surgeon: Earline Mayotte, MD;  Location: [facility];  Service: General;  Laterality: Right;  wire localization of lymph node   SKIN BIOPSY Right 11/09/2021   Procedure: PUNCH BIOPSY SKIN, TATTOO LYMPH NODE RIGHT AXILLA;  Surgeon: Earline Mayotte, MD;  Location: [facility] OR[facility];  Service: General;  Laterality: Right;   WISDOM TOOTH EXTRACTION     Patient Active Problem List   Diagnosis Date Noted   Acquired absence of right breast 06/18/2022   Low vitamin B12 level 05/24/2022   Hypomagnesemia 05/16/2022   Lactic acid acidosis 03/06/2022   Fever and chills 03/05/2022   COVID-19 virus infection 12/28/2021   Thrombocytopenia (HCC) 12/28/2021   Diabetes mellitus (HCC) 12/28/2021   Febrile neutropenia (HCC) 12/27/2021   Sepsis (HCC) 12/27/2021   Genetic testing 12/01/2021   Carcinoma of upper-outer quadrant  of right breast in female, estrogen receptor positive (HCC) 10/26/2021   Uterus, adenomyosis 05/14/2021   ASCUS of cervix with negative high risk HPV 04/23/2021   Iron deficiency anemia due to chronic blood loss 04/23/2021   Type 2 diabetes mellitus without complication, without long-term current use of insulin (HCC) 05/18/2020   RLS (restless legs syndrome) 05/14/2020   Menorrhagia with regular cycle 10/14/2017   BMI 37.0-37.9, adult 10/03/2017   History of gestational diabetes  10/03/2017    PCP: [name], NP   REFERRING PROVIDER: [name], MD   REFERRING DIAG:  M25.611 (ICD-10-CM) - Stiffness of right shoulder, not elsewhere classified  C50.411,Z17.0 (ICD-10-CM) - Carcinoma of upper-outer quadrant of right breast in female, estrogen receptor positive    THERAPY DIAG:  Stiffness of right shoulder, not elsewhere classified  Muscle weakness (generalized)  Rationale for Evaluation and Treatment: Rehabilitation  ONSET DATE: 04/2022   SUBJECTIVE:     From Initial evaluation note 01/31/23                                                                                                                                                                                  SUBJECTIVE STATEMENT: Patient has been seeing [name], OT for lymphedema. Patient reports pain and "pulling" on posterior aspect of upper arm. Patient reports difficulty sleeping, reaching overhead, getting dressed (clasping bra), lifting heavy objects.  Patient has pulley system and red theraband. Has been completing stretches 2x/week and pulley/theraband 2-3x/week.  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 Started 6 weeks of radiation 07/2022 and currently in chemotherapy.   Patient referred to PT for R shoulder pain s/p R mastectomy/removal of expanders and radiation. Patient has seen OT for lymphedema and R shoulder ROM ~1 month for maintenance. OT has provided patient with stretches focused on pec and lat muscle tightness (shoulder abduction and flexion in supine with rotation of hips to the L), child's pose, and AAROM shoulder flexion/abduction on the table.    PAIN:  Are you having pain? Yes: NPRS scale: 2/10 - at worst 8/10 Pain location: R shoulder  Pain description: sharp, achy Aggravating factors: reaching, quick movements with arm   Relieving factors: Ice, massage ball   PRECAUTIONS: None  RED FLAGS: None   WEIGHT BEARING RESTRICTIONS: No  FALLS:  Has patient fallen in last 6 months? 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 [place]  PLOF: Independent  PATIENT GOALS:to get this to not hurt   NEXT MD VISIT: 02/04/23 - chemo  OBJECTIVE:   DIAGNOSTIC FINDINGS:  N/A  PATIENT SURVEYS:  FOTO 7 with goal of 70  COGNITION: Overall cognitive status: Within functional limits for tasks assessed     SENSATION: WFL  POSTURE: Rounded shoulders, forward head   UPPER EXTREMITY ROM:   Active ROM Right eval Left eval  Shoulder flexion 120 WFL   Shoulder extension    Shoulder abduction 87 WFL   Shoulder adduction    Shoulder internal rotation 35 WFL  Shoulder external rotation 30 WFL   (Blank rows = not tested)  UPPER EXTREMITY MMT:  MMT Right eval Left eval  Shoulder flexion 4+ 4+  Shoulder extension    Shoulder abduction 4* 4+  Shoulder adduction    Shoulder internal rotation 4* 4+  Shoulder external rotation 4* 4+  Middle trapezius    Lower trapezius    Elbow flexion 4+ 5  Elbow extension 4+ 5  (Blank rows = not tested)  JOINT MOBILITY TESTING:  Joint stiffness noted with AP/PA and inferior mobilization   PALPATION:  Significant trigger points and taut muscle bands at pec minor, R UT, and periscapular region.    TODAY'S TREATMENT:                                                                                                                                         DATE: 05/30/23 Subjective: Pt has no new complaints upon arrival this morning; working on her HEP; has small hand weights for the new strengthening exercises.         Pain 0/10 R shoulder at rest upon arrival  Objective:  Manual Therapy: 25 min Jt mobilizations: R GH distraction, A/P and inf glides Gr III/IV in various positions of flexion, abd (pt in supine hooklying position), seated distraction with IR HBB GR III/IV- not today  STM/TPR- focused on R serratus ant, lats, teres major, inferior axilla soft tissue (in sidelying today) with R arm propped in abd; STM R anterior pectoral region  Manual pec minor stretch/scapular mob in sidelying  Scapular mobs- all directions after STM x10 ea  PROM flexion, abd, ER, IR- 5-10x ea  UPPER EXTREMITY ROM:   Active ROM Right eval Left eval Right 03/23/23 Right 04/25/23 R 05/23/23  Shoulder flexion 120 WFL  140 supine 130 standing 145 standing 145 standing 152 standing  Shoulder extension       Shoulder abduction 87 WFL  120 supine AAROM 100 standing   130 standing   Shoulder adduction       Shoulder internal rotation 35 WFL HBB thumb to L4  HBB thumb to L2  Shoulder external rotation 30 WFL  55  45 deg  (Blank rows = not tested)  Shoulder strength: R IR 5/5, ER 4+/5, Flexion 4+/5 Therapeutic Exercise: 18 minutes  UBE: 3 min fwd/3 rev.  Standing shoulder AROM- flexion, abd, IR, ER after manual therapy x2 ea  Counter top child's pose 5x Bicep curl: 5# 2x15- not today, HEP Shoulder flexion/scaption: 2# 2x12 Shoulder abduction: 2# x12, x10 Chest press: 3#  2x12- not today, HEP Standing row BTB 2 x 15 with scapular retraction  Standing IR with towel roll at elbow BTB 2 x 15- not today Standing ER with towel roll at elbow RTB 2 x 15- not today Standing shoulder extension with scapular retraction BTB 2x15- not today Standing b/l ER BTB 2x10  Not today AAROM flexion, abd with dowel x 10 each- not today Towel IR hand behind back stretch 3x 20 seconds with strap- reviewed Finger ladder: abduction direction 5x with emphasis on relaxing UT substitution- pt notes "tightness" in anterior pec mm  region- not today Wall physioball roll into flexion with end range flexion stretch x 10 Doorway pec stretch R 5x 30 seconds Scapular retractions in standing: 2x10 Scapular retraction + rows: 2x10 RTB- not today Seated thoracic extension x10 over chair- not toda  PATIENT EDUCATION: Education details: HEP, POC, goals, self TPR techniques Person educated: Patient Education method: Explanation, Demonstration, and Handouts Education comprehension: verbalized understanding  HOME EXERCISE PROGRAM: Access Code: [email or web address] URL: [email or web address] Date: 02/23/2023 Prepared by: Max Fickle  Exercises - Pec Minor Stretch  - 2-3 x daily - 7 x weekly - 5-10 reps - 30 hold - Corner Pec Major Stretch  - 2-3 x daily - 7 x weekly - 5-10 reps - 30 hold - Supine Shoulder Flexion with Dowel  - 2-3 x daily - 7 x weekly - 10 reps - 3-5 hold - Supine Shoulder Abduction AAROM with Dowel  - 2-3 x daily - 7 x weekly - 10 reps - 3-5 hold - Shoulder Flexion Wall Slide with Towel  - 1 x daily - 7 x weekly - 5 reps - 10-20 hold - Seated Shoulder Abduction Towel Slide at Table Top  - 1 x daily - 7 x weekly - 5 sets - 5 reps - 30 hold - Doorway Pec Stretch at 60 Elevation  - 1 x daily - 7 x weekly - 5 sets - 5 reps - 30 hold - Seated Shoulder External Rotation PROM on Table  - 1 x daily - 7 x weekly - 5 sets - 5 reps - 30 hold - Standing Row with Anchored Resistance  - 1 x daily - 3 x weekly - 2 sets - 10 reps - Shoulder Internal Rotation with Resistance  - 1 x daily - 3 x weekly - 2 sets - 10 reps - Shoulder External Rotation with Anchored Resistance  - 1 x daily - 3 x weekly - 2 sets - 10 reps - Standing Shoulder Internal Rotation Stretch with Towel  - 1 x daily - 7 x weekly - 3 reps - 15 hold  ASSESSMENT:  CLINICAL IMPRESSION:   Focused tx today on manual therapy to address ROM and soft tissue restrictions in anterior/posterior/inferior shoulder region and shoulder strengthening exercises to  promote ability to use R UE through full available ROM.  Pt tolerated strengthening well.  Also tolerated manual therapy well.  Hypomobile soft tissue restrictions continue to limit her ability to fully achieve end range overhead reach.  Planning to continue with tx 1x/week for mainly manual therapy emphasis and progressing HEP as appropriate due to anticipated long term  tissue remodeling timeframe for optimal recovery/outcome with PT.  Patient will benefit from skilled PT interventions to address listed impairments to improve ADL tolerance, pain reduction, and improve pain free R shoulder ROM.   OBJECTIVE IMPAIRMENTS: decreased activity tolerance, decreased ROM, decreased strength, hypomobility, impaired flexibility, impaired UE functional use, and postural dysfunction.   ACTIVITY LIMITATIONS: carrying, lifting, bathing, dressing, reach over head, and hygiene/grooming  PARTICIPATION LIMITATIONS: laundry, shopping, community activity, occupation, and yard work  PERSONAL FACTORS: Past/current experiences, Profession, Time since onset of injury/illness/exacerbation, and 1 comorbidity: breast cancer s/p mastectomy  are also affecting patient's functional outcome.   REHAB POTENTIAL: Good  CLINICAL DECISION MAKING: Stable/uncomplicated  EVALUATION COMPLEXITY: Moderate   GOALS: Goals reviewed with patient? Yes  SHORT TERM GOALS: Target date: 06/20/2023  Patient will be independent in HEP to improve strength/ROM for better functional independence with ADLs. Baseline: 8/26: HEP initiated ; 03/23/23: in progress, adjusting and progressing HEP as appropriate; 06/02/23: HEP has been updated to include strengthenintg Goal status: In Progress  LONG TERM GOALS: Target date: 06/20/2023  Patient will increase FOTO score to equal to or greater than 69 to demonstrate statistically significant improvement in mobility and quality of life.  Baseline: 8/26: 54; 05/23/23: in progress Goal status: In  Progress  2.  Patient will demonstrate adequate R shoulder ROM and strength to be able to clasp bra, dress independently, and perform overhead activities with pain less than 2/10.  Baseline: 8/26: unable to clasp bra with IR limitation; 03/23/23: in progress, able to reach thumb to L4 R; able to reach hair but not lift item >7 lbs off high shelf with R UE only; 05/23/23: R shoulder flexion AROM 145 deg today/PROM 152 deg (>150 deg actively is the goal for functional overhead reach Goal status: In progress  3.  Patient will improve B UE strength by 1/2 MMT grade to improve ability to complete iADLs and job related tasks.  Baseline: 8/26: see above; 03/23/23: in progress; 05/23/23: in progress R shoulder IR 5/5, others 4+/5 Goal status: In progress  4.  Patient will report a worst pain of 2/10 on NRPS in R shoulder to improve tolerance with ADLs and reduced symptoms with activities.  Baseline: 8/26: worst pain 8/10; 03/23/23: worst pain 4/10; 05/23/23 3/10 worst Goal status: In progress  PLAN:  PT FREQUENCY: 1-2x/week  PT DURATION: 8 weeks  PLANNED INTERVENTIONS: Therapeutic exercises, Therapeutic activity, Patient/Family education, Self Care, Joint mobilization, Joint manipulation, DME instructions, Dry Needling, Spinal mobilization, Cryotherapy, Moist heat, Taping, Traction, and Manual therapy  PLAN FOR NEXT SESSION: joint mobilizations, scapulothoracic mob, continue ROM for shoulder ROM deficits; scapular retractions; continue manual therapy techniques STM/TPR;    Max Fickle, PT, DPT, OCS  Physical Therapist - [facility]  05/30/2023, 2:09 PM

## 2023-06-06 ENCOUNTER — Ambulatory Visit: Payer: 59

## 2023-06-06 DIAGNOSIS — M6281 Muscle weakness (generalized): Secondary | ICD-10-CM

## 2023-06-06 DIAGNOSIS — M25611 Stiffness of right shoulder, not elsewhere classified: Secondary | ICD-10-CM | POA: Diagnosis not present

## 2023-06-06 NOTE — Therapy (Signed)
OUTPATIENT PHYSICAL THERAPY SHOULDER TREATMENT Progress Note/Re-certification through 06/20/23   Patient Name: Marilyn Page MRN: [id number] DOB:[birth year], 47 y.o., female Today's Date: 06/06/2023  END OF SESSION:  PT End of Session - 06/06/23 1109     Visit Number 19    Number of Visits 24    Date for PT Re-Evaluation 06/20/23    Authorization Type 1x/week x 8 weeks: re-cert through 05/18/23 (PN/recert done today x4 more weeks 06/20/23)    Authorization Time Period 30 v /calendar year PT/OT/Speech    PT Start Time 1112    PT Stop Time 1158    PT Time Calculation (min) 46 min    Activity Tolerance Patient tolerated treatment well    Behavior During Therapy [facility] for tasks assessed/performed                Past Medical History:  Diagnosis Date   Anemia    Carcinoma of upper-outer quadrant of right breast in female, estrogen receptor positive (HCC) 10/26/2021   Diabetes mellitus without complication (HCC)    Family history of adverse reaction to anesthesia    grandmother had issues but doesnt know what the issue was, she was older.   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 Axilla Butterfly hydro clip-MACRO METASTATIC MAMMARY CARCINOMA   BREAST BIOPSY Right 04/21/2022   [place] RT PLC BREAST LOC DEV   1ST LESION  INC US GUIDE 04/21/2022 [facility]   BREAST RECONSTRUCTION WITH PLACEMENT OF TISSUE EXPANDER AND FLEX HD (ACELLULAR HYDRATED DERMIS) Right 04/21/2022   Procedure: RIGHT BREAST RECONSTRUCTION WITH PLACEMENT OF TISSUE EXPANDER AND FLEX HD (ACELLULAR HYDRATED DERMIS);  Surgeon: Peggye Form, DO;  Location: [facility] ORS;   Service: Plastics;  Laterality: Right;   CESAREAN SECTION  2005/2008   DILATION AND CURETTAGE OF UTERUS  2003   PORTACATH PLACEMENT Left 11/09/2021   Procedure: INSERTION PORT-A-CATH;  Surgeon: Earline Mayotte, MD;  Location: [facility] OR[facility];  Service: General;  Laterality: Left;   REMOVAL OF TISSUE EXPANDER AND PLACEMENT OF IMPLANT Right 06/08/2022   Procedure: REMOVAL OF TISSUE EXPANDER;  Surgeon: Peggye Form, DO;  Location: [facility];  Service: Plastics;  Laterality: Right;   SIMPLE MASTECTOMY WITH AXILLARY SENTINEL NODE BIOPSY Right 04/21/2022   Procedure: SIMPLE MASTECTOMY WITH AXILLARY SENTINEL NODE BIOPSY;  Surgeon: Earline Mayotte, MD;  Location: [facility];  Service: General;  Laterality: Right;  wire localization of lymph node   SKIN BIOPSY Right 11/09/2021   Procedure: PUNCH BIOPSY SKIN, TATTOO LYMPH NODE RIGHT AXILLA;  Surgeon: Earline Mayotte, MD;  Location: [facility] OR[facility];  Service: General;  Laterality: Right;   WISDOM TOOTH EXTRACTION     Patient Active Problem List   Diagnosis Date Noted   Acquired absence of right breast 06/18/2022   Low vitamin B12 level 05/24/2022   Hypomagnesemia 05/16/2022   Lactic acid acidosis 03/06/2022   Fever and chills 03/05/2022   COVID-19 virus infection 12/28/2021   Thrombocytopenia (HCC) 12/28/2021   Diabetes mellitus (HCC) 12/28/2021   Febrile neutropenia (HCC) 12/27/2021   Sepsis (HCC) 12/27/2021   Genetic testing 12/01/2021   Carcinoma of upper-outer quadrant  of right breast in female, estrogen receptor positive (HCC) 10/26/2021   Uterus, adenomyosis 05/14/2021   ASCUS of cervix with negative high risk HPV 04/23/2021   Iron deficiency anemia due to chronic blood loss 04/23/2021   Type 2 diabetes mellitus without complication, without long-term current use of insulin (HCC) 05/18/2020   RLS (restless legs syndrome) 05/14/2020   Menorrhagia with regular cycle 10/14/2017   BMI 37.0-37.9, adult 10/03/2017   History of gestational diabetes  10/03/2017    PCP: [name], NP   REFERRING PROVIDER: [name], MD   REFERRING DIAG:  M25.611 (ICD-10-CM) - Stiffness of right shoulder, not elsewhere classified  C50.411,Z17.0 (ICD-10-CM) - Carcinoma of upper-outer quadrant of right breast in female, estrogen receptor positive    THERAPY DIAG:  Stiffness of right shoulder, not elsewhere classified  Muscle weakness (generalized)  Rationale for Evaluation and Treatment: Rehabilitation  ONSET DATE: 04/2022   SUBJECTIVE:     From Initial evaluation note 01/31/23                                                                                                                                                                                  SUBJECTIVE STATEMENT: Patient has been seeing [name], OT for lymphedema. Patient reports pain and "pulling" on posterior aspect of upper arm. Patient reports difficulty sleeping, reaching overhead, getting dressed (clasping bra), lifting heavy objects.  Patient has pulley system and red theraband. Has been completing stretches 2x/week and pulley/theraband 2-3x/week.  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 Started 6 weeks of radiation 07/2022 and currently in chemotherapy.   Patient referred to PT for R shoulder pain s/p R mastectomy/removal of expanders and radiation. Patient has seen OT for lymphedema and R shoulder ROM ~1 month for maintenance. OT has provided patient with stretches focused on pec and lat muscle tightness (shoulder abduction and flexion in supine with rotation of hips to the L), child's pose, and AAROM shoulder flexion/abduction on the table.    PAIN:  Are you having pain? Yes: NPRS scale: 2/10 - at worst 8/10 Pain location: R shoulder  Pain description: sharp, achy Aggravating factors: reaching, quick movements with arm   Relieving factors: Ice, massage ball   PRECAUTIONS: None  RED FLAGS: None   WEIGHT BEARING RESTRICTIONS: No  FALLS:  Has patient fallen in last 6 months? 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 [place]  PLOF: Independent  PATIENT GOALS:to get this to not hurt   NEXT MD VISIT: 02/04/23 - chemo  OBJECTIVE:   DIAGNOSTIC FINDINGS:  N/A  PATIENT SURVEYS:  FOTO 30 with goal of 72  COGNITION: Overall cognitive status: Within functional limits for tasks assessed     SENSATION: WFL  POSTURE: Rounded shoulders, forward head   UPPER EXTREMITY ROM:   Active ROM Right eval Left eval  Shoulder flexion 120 WFL   Shoulder extension    Shoulder abduction 87 WFL   Shoulder adduction    Shoulder internal rotation 35 WFL  Shoulder external rotation 30 WFL   (Blank rows = not tested)  UPPER EXTREMITY MMT:  MMT Right eval Left eval  Shoulder flexion 4+ 4+  Shoulder extension    Shoulder abduction 4* 4+  Shoulder adduction    Shoulder internal rotation 4* 4+  Shoulder external rotation 4* 4+  Middle trapezius    Lower trapezius    Elbow flexion 4+ 5  Elbow extension 4+ 5  (Blank rows = not tested)  JOINT MOBILITY TESTING:  Joint stiffness noted with AP/PA and inferior mobilization   PALPATION:  Significant trigger points and taut muscle bands at pec minor, R UT, and periscapular region.    TODAY'S TREATMENT:                                                                                                                                         DATE: 06/06/23 Subjective: Pt has no new complaints upon arrival this morning; functionally she is feeling like she can use her arm for all her daily activities now;  feels comfortable continuing with her HEP independently after today.  Pain 0/10 R shoulder at rest upon arrival  Objective:  Manual Therapy: 28 min Jt mobilizations: R GH distraction, A/P and inf glides Gr III/IV in various positions of flexion, abd (pt in supine hooklying position), seated distraction with IR HBB GR III/IV- not today  STM/TPR- focused on R serratus ant, lats, teres major, inferior axilla soft tissue (in sidelying today) with R arm propped in abd; STM R anterior pectoral region  Manual pec minor stretch/scapular mob in sidelying  Scapular mobs- all directions after STM x10 ea  PROM flexion, abd, ER, IR- 5-10x ea  UPPER EXTREMITY ROM:   Active ROM Right eval Left eval Right 03/23/23 Right 04/25/23 R 05/23/23 R 06/06/23  Shoulder flexion AROM 120 WFL  140 supine 130 standing 145 standing 145 standing 152 standing 145 standing  Shoulder extension        Shoulder abduction AROM 87 WFL  120 supine AAROM 100 standing   130 standing  130 standing  Shoulder adduction        Shoulder internal rotation 35 WFL HBB thumb to L4  HBB thumb to L2 HBB  thumb L2  Shoulder external rotation 30 WFL  55  45 deg 45 deg   (Blank rows = not tested) Shoulder strength: R IR 5/5, ER 5/5 (was 4+/5 on 05/23/23), Flexion 4+/5, all L 5/5 R shoulder AROM- see above  Therapeutic Exercise: 15 minutes  UBE: 3 min fwd/3 rev.  Standing shoulder AROM- flexion, abd, IR, ER after manual therapy x2 ea  Counter top child's pose 5x Bicep curl: 5# 2x15- not today, HEP Shoulder flexion/scaption: 2# 2x12 Shoulder abduction: 2# x12, x10 Chest press: 3#  2x12- not today, HEP Standing row BTB 2 x 15 with scapular retraction  Standing IR with towel roll at elbow BTB 2 x 15- not today Standing ER with towel roll at elbow RTB 2 x 15- not today Standing shoulder extension with scapular retraction BTB 2x15 Standing b/l ER BTB 2x10  Not today AAROM flexion, abd with dowel x 10 each- not  today Towel IR hand behind back stretch 3x 20 seconds with strap- reviewed Finger ladder: abduction direction 5x with emphasis on relaxing UT substitution- pt notes "tightness" in anterior pec mm region- not today Wall physioball roll into flexion with end range flexion stretch x 10 Doorway pec stretch R 5x 30 seconds Scapular retractions in standing: 2x10 Scapular retraction + rows: 2x10 RTB- not today Seated thoracic extension x10 over chair- not toda  PATIENT EDUCATION: Education details: HEP, POC, goals, self TPR techniques Person educated: Patient Education method: Explanation, Demonstration, and Handouts Education comprehension: verbalized understanding  HOME EXERCISE PROGRAM: Access Code: [email or web address] URL: [email or web address] Date: 02/23/2023 Prepared by: Max Fickle  Exercises - Pec Minor Stretch  - 2-3 x daily - 7 x weekly - 5-10 reps - 30 hold - Corner Pec Major Stretch  - 2-3 x daily - 7 x weekly - 5-10 reps - 30 hold - Supine Shoulder Flexion with Dowel  - 2-3 x daily - 7 x weekly - 10 reps - 3-5 hold - Supine Shoulder Abduction AAROM with Dowel  - 2-3 x daily - 7 x weekly - 10 reps - 3-5 hold - Shoulder Flexion Wall Slide with Towel  - 1 x daily - 7 x weekly - 5 reps - 10-20 hold - Seated Shoulder Abduction Towel Slide at Table Top  - 1 x daily - 7 x weekly - 5 sets - 5 reps - 30 hold - Doorway Pec Stretch at 60 Elevation  - 1 x daily - 7 x weekly - 5 sets - 5 reps - 30 hold - Seated Shoulder External Rotation PROM on Table  - 1 x daily - 7 x weekly - 5 sets - 5 reps - 30 hold - Standing Row with Anchored Resistance  - 1 x daily - 3 x weekly - 2 sets - 10 reps - Shoulder Internal Rotation with Resistance  - 1 x daily - 3 x weekly - 2 sets - 10 reps - Shoulder External Rotation with Anchored Resistance  - 1 x daily - 3 x weekly - 2 sets - 10 reps - Standing Shoulder Internal Rotation Stretch with Towel  - 1 x daily - 7 x weekly - 3 reps - 15  hold  ASSESSMENT:  CLINICAL IMPRESSION:   Pt's shoulder AROM has improved since starting PT.  No significant additional improvements measured since 05/23/23.  Her strength has improved and is nearly similar to her L UE now.  At this time she reports being interested in continuing her home stretching and strengthening exercise routine.  Hypomobile  soft tissue restrictions continue to limit her ability to fully achieve end range overhead reach.  Pt is planning to continue working on her HEP independently for now to address remaining shoulder AROM and strength limitations.  Functionally she reports no longer being limited by her shoulder during her ADLs or activities at home and work.  OBJECTIVE IMPAIRMENTS: decreased activity tolerance, decreased ROM, decreased strength, hypomobility, impaired flexibility, impaired UE functional use, and postural dysfunction.   ACTIVITY LIMITATIONS: carrying, lifting, bathing, dressing, reach over head, and hygiene/grooming  PARTICIPATION LIMITATIONS: laundry, shopping, community activity, occupation, and yard work  PERSONAL FACTORS: Past/current experiences, Profession, Time since onset of injury/illness/exacerbation, and 1 comorbidity: breast cancer s/p mastectomy  are also affecting patient's functional outcome.   REHAB POTENTIAL: Good  CLINICAL DECISION MAKING: Stable/uncomplicated  EVALUATION COMPLEXITY: Moderate   GOALS: Goals reviewed with patient? Yes  SHORT TERM GOALS: Target date: 06/20/2023  Patient will be independent in HEP to improve strength/ROM for better functional independence with ADLs. Baseline: 8/26: HEP initiated ; 03/23/23: in progress, adjusting and progressing HEP as appropriate; 06/02/23: HEP has been updated to include strengthenintg Goal status: In Progress  LONG TERM GOALS: Target date: 06/20/2023  Patient will increase FOTO score to equal to or greater than 69 to demonstrate statistically significant improvement in mobility  and quality of life.  Baseline: 8/26: 54; 05/23/23: in progress Goal status: In Progress  2.  Patient will demonstrate adequate R shoulder ROM and strength to be able to clasp bra, dress independently, and perform overhead activities with pain less than 2/10.  Baseline: 8/26: unable to clasp bra with IR limitation; 03/23/23: in progress, able to reach thumb to L4 R; able to reach hair but not lift item >7 lbs off high shelf with R UE only; 05/23/23: R shoulder flexion AROM 145 deg today/PROM 152 deg (>150 deg actively is the goal for functional overhead reach Goal status: In progress  3.  Patient will improve B UE strength by 1/2 MMT grade to improve ability to complete iADLs and job related tasks.  Baseline: 8/26: see above; 03/23/23: in progress; 05/23/23: in progress R shoulder IR 5/5, others 4+/5 Goal status: In progress  4.  Patient will report a worst pain of 2/10 on NRPS in R shoulder to improve tolerance with ADLs and reduced symptoms with activities.  Baseline: 8/26: worst pain 8/10; 03/23/23: worst pain 4/10; 05/23/23 3/10 worst Goal status: In progress  PLAN:  PT FREQUENCY: 1-2x/week  PT DURATION: 8 weeks  PLANNED INTERVENTIONS: Therapeutic exercises, Therapeutic activity, Patient/Family education, Self Care, Joint mobilization, Joint manipulation, DME instructions, Dry Needling, Spinal mobilization, Cryotherapy, Moist heat, Taping, Traction, and Manual therapy  PLAN FOR NEXT SESSION: joint mobilizations, scapulothoracic mob, continue ROM for shoulder ROM deficits; scapular retractions; continue manual therapy techniques STM/TPR;    Max Fickle, PT, DPT, OCS  Physical Therapist - [facility]  06/06/2023, 11:09 AM

## 2023-06-21 ENCOUNTER — Encounter: Payer: Self-pay | Admitting: Plastic Surgery

## 2023-06-21 ENCOUNTER — Ambulatory Visit (INDEPENDENT_AMBULATORY_CARE_PROVIDER_SITE_OTHER): Payer: 59 | Admitting: Plastic Surgery

## 2023-06-21 VITALS — BP 114/73 | HR 77 | Ht 66.0 in | Wt 208.0 lb

## 2023-06-21 DIAGNOSIS — Z9011 Acquired absence of right breast and nipple: Secondary | ICD-10-CM | POA: Diagnosis not present

## 2023-06-21 DIAGNOSIS — Z08 Encounter for follow-up examination after completed treatment for malignant neoplasm: Secondary | ICD-10-CM

## 2023-06-21 DIAGNOSIS — Z923 Personal history of irradiation: Secondary | ICD-10-CM

## 2023-06-21 NOTE — Progress Notes (Signed)
   Subjective:    Patient ID: Marilyn Page, female    DOB: Mar 16, 1976, 48 y.o.   MRN: [id number]  Patient is a 48 year old female here for follow-up after having her right expander removed 1 year ago.  Overall she is doing really well.  She feels like the puffiness on the lateral aspect of the right breast has improved.  I think as a result from her radiation her skin may get a little bit tighter.  There is no sign of a hematoma or seroma.      Review of Systems  Constitutional: Negative.   HENT: Negative.    Eyes: Negative.   Respiratory: Negative.    Cardiovascular: Negative.   Gastrointestinal: Negative.   Endocrine: Negative.   Genitourinary: Negative.        Objective:   Physical Exam     Assessment & Plan:     ICD-10-CM   1. Acquired absence of right breast  Z90.11        We discussed the option for replacement.  The patient is worried about putting another expander in and I do not blame her.  Another option would be to do a mastopexy on the left and some fat grafting on the right so that she would have a little more contour within her bra.  She is going to give it some thought but we will plan to see her back in 1 year. Pictures were obtained of the patient and placed in the chart with the patient's or guardian's permission.

## 2023-06-22 ENCOUNTER — Other Ambulatory Visit: Payer: Self-pay

## 2023-06-24 ENCOUNTER — Inpatient Hospital Stay: Payer: 59 | Attending: Internal Medicine

## 2023-06-24 DIAGNOSIS — C50411 Malignant neoplasm of upper-outer quadrant of right female breast: Secondary | ICD-10-CM | POA: Diagnosis present

## 2023-06-24 DIAGNOSIS — Z1721 Progesterone receptor positive status: Secondary | ICD-10-CM | POA: Diagnosis not present

## 2023-06-24 DIAGNOSIS — Z1732 Human epidermal growth factor receptor 2 negative status: Secondary | ICD-10-CM | POA: Insufficient documentation

## 2023-06-24 DIAGNOSIS — Z95828 Presence of other vascular implants and grafts: Secondary | ICD-10-CM

## 2023-06-24 DIAGNOSIS — Z170 Estrogen receptor positive status [ER+]: Secondary | ICD-10-CM | POA: Insufficient documentation

## 2023-06-24 DIAGNOSIS — Z452 Encounter for adjustment and management of vascular access device: Secondary | ICD-10-CM | POA: Insufficient documentation

## 2023-06-24 MED ORDER — HEPARIN SOD (PORK) LOCK FLUSH 100 UNIT/ML IV SOLN
500.0000 [IU] | Freq: Once | INTRAVENOUS | Status: AC
  Administered 2023-06-24: 500 [IU] via INTRAVENOUS
  Filled 2023-06-24: qty 5

## 2023-07-18 ENCOUNTER — Telehealth: Payer: 59 | Admitting: Physician Assistant

## 2023-07-18 DIAGNOSIS — J329 Chronic sinusitis, unspecified: Secondary | ICD-10-CM | POA: Diagnosis not present

## 2023-07-18 MED ORDER — FLUTICASONE PROPIONATE 50 MCG/ACT NA SUSP: 2.0000 | Freq: Every day | NASAL | 0 refills | Status: DC

## 2023-07-18 MED ORDER — AMOXICILLIN-POT CLAVULANATE 875-125 MG PO TABS: 1.0000 | ORAL_TABLET | Freq: Two times a day (BID) | ORAL | 0 refills | Status: AC

## 2023-07-18 MED ORDER — BENZONATATE 100 MG PO CAPS: 100.0000 mg | ORAL_CAPSULE | Freq: Three times a day (TID) | ORAL | 0 refills | Status: AC

## 2023-07-18 NOTE — Progress Notes (Signed)
 E-Visit for Sinus Problems  We are sorry that you are not feeling well.  Here is how we plan to help!  Based on what you have shared with me it looks like you have sinusitis.  Sinusitis is inflammation and infection in the sinus cavities of the head.  Based on your presentation I believe you most likely have Acute Bacterial Sinusitis.  This is an infection caused by bacteria and is treated with antibiotics. I have prescribed Augmentin  875mg /125mg  one tablet twice daily with food, for 7 days. I have also prescribed fluticasone  and tessalon .  You may use an oral decongestant such as Mucinex  D or if you have glaucoma or high blood pressure use plain Mucinex . Saline nasal spray help and can safely be used as often as needed for congestion.  If you develop worsening sinus pain, fever or notice severe headache and vision changes, or if symptoms are not better after completion of antibiotic, please schedule an appointment with a health care provider.    Sinus infections are not as easily transmitted as other respiratory infection, however we still recommend that you avoid close contact with loved ones, especially the very young and elderly.  Remember to wash your hands thoroughly throughout the day as this is the number one way to prevent the spread of infection!  Home Care: Only take medications as instructed by your medical team. Complete the entire course of an antibiotic. Do not take these medications with alcohol. A steam or ultrasonic humidifier can help congestion.  You can place a towel over your head and breathe in the steam from hot water  coming from a faucet. Avoid close contacts especially the very young and the elderly. Cover your mouth when you cough or sneeze. Always remember to wash your hands.  Get Help Right Away If: You develop worsening fever or sinus pain. You develop a severe head ache or visual changes. Your symptoms persist after you have completed your treatment plan.  Make  sure you Understand these instructions. Will watch your condition. Will get help right away if you are not doing well or get worse.  Thank you for choosing an e-visit.  Your e-visit answers were reviewed by a board certified advanced clinical practitioner to complete your personal care plan. Depending upon the condition, your plan could have included both over the counter or prescription medications.  Please review your pharmacy choice. Make sure the pharmacy is open so you can pick up prescription now. If there is a problem, you may contact your provider through [employer] and have the prescription routed to another pharmacy.  Your safety is important to us . If you have drug allergies check your prescription carefully.   For the next 24 hours you can use MyChart to ask questions about today's visit, request a non-urgent call back, or ask for a work or school excuse. You will get an email in the next two days asking about your experience. I hope that your e-visit has been valuable and will speed your recovery.   Approximately 5 minutes was spent documenting and reviewing patient's chart.

## 2023-08-11 ENCOUNTER — Ambulatory Visit
Admission: RE | Admit: 2023-08-11 | Discharge: 2023-08-11 | Disposition: A | Discharge status: Home / Self Care | Payer: 59 | Source: Ambulatory Visit | Attending: Internal Medicine | Admitting: Internal Medicine

## 2023-08-11 DIAGNOSIS — C50411 Malignant neoplasm of upper-outer quadrant of right female breast: Secondary | ICD-10-CM | POA: Insufficient documentation

## 2023-08-11 DIAGNOSIS — Z170 Estrogen receptor positive status [ER+]: Secondary | ICD-10-CM

## 2023-08-11 LAB — POCT I-STAT CREATININE: Creatinine, Ser: 0.7 mg/dL (ref 0.44–1.00)

## 2023-08-11 MED ORDER — IOHEXOL 300 MG/ML  SOLN
100.0000 mL | Freq: Once | INTRAMUSCULAR | Status: AC | PRN
  Administered 2023-08-11: 100 mL via INTRAVENOUS

## 2023-08-17 ENCOUNTER — Inpatient Hospital Stay: Payer: 59 | Admitting: Internal Medicine

## 2023-08-17 ENCOUNTER — Inpatient Hospital Stay: Payer: 59

## 2023-08-17 ENCOUNTER — Inpatient Hospital Stay: Payer: 59 | Attending: Internal Medicine

## 2023-08-17 ENCOUNTER — Encounter: Payer: Self-pay | Admitting: Internal Medicine

## 2023-08-17 VITALS — BP 112/47 | HR 74 | Temp 97.8°F | Resp 20 | Ht 66.0 in | Wt 221.0 lb

## 2023-08-17 DIAGNOSIS — E1140 Type 2 diabetes mellitus with diabetic neuropathy, unspecified: Secondary | ICD-10-CM | POA: Insufficient documentation

## 2023-08-17 DIAGNOSIS — D696 Thrombocytopenia, unspecified: Secondary | ICD-10-CM | POA: Insufficient documentation

## 2023-08-17 DIAGNOSIS — C50411 Malignant neoplasm of upper-outer quadrant of right female breast: Secondary | ICD-10-CM | POA: Diagnosis not present

## 2023-08-17 DIAGNOSIS — Z170 Estrogen receptor positive status [ER+]: Secondary | ICD-10-CM

## 2023-08-17 DIAGNOSIS — Z79810 Long term (current) use of selective estrogen receptor modulators (SERMs): Secondary | ICD-10-CM | POA: Insufficient documentation

## 2023-08-17 DIAGNOSIS — E876 Hypokalemia: Secondary | ICD-10-CM | POA: Diagnosis not present

## 2023-08-17 DIAGNOSIS — D500 Iron deficiency anemia secondary to blood loss (chronic): Secondary | ICD-10-CM

## 2023-08-17 LAB — CBC WITH DIFFERENTIAL (CANCER CENTER ONLY)
Abs Immature Granulocytes: 0.02 10*3/uL (ref 0.00–0.07)
Basophils Absolute: 0 10*3/uL (ref 0.0–0.1)
Basophils Relative: 1 %
Eosinophils Absolute: 0.1 10*3/uL (ref 0.0–0.5)
Eosinophils Relative: 2 %
HCT: 34.9 % — ABNORMAL LOW (ref 36.0–46.0)
Hemoglobin: 11.5 g/dL — ABNORMAL LOW (ref 12.0–15.0)
Immature Granulocytes: 0 %
Lymphocytes Relative: 26 %
Lymphs Abs: 1.4 10*3/uL (ref 0.7–4.0)
MCH: 29.3 pg (ref 26.0–34.0)
MCHC: 33 g/dL (ref 30.0–36.0)
MCV: 88.8 fL (ref 80.0–100.0)
Monocytes Absolute: 0.4 10*3/uL (ref 0.1–1.0)
Monocytes Relative: 7 %
Neutro Abs: 3.5 10*3/uL (ref 1.7–7.7)
Neutrophils Relative %: 64 %
Platelet Count: 113 10*3/uL — ABNORMAL LOW (ref 150–400)
RBC: 3.93 MIL/uL (ref 3.87–5.11)
RDW: 14.6 % (ref 11.5–15.5)
WBC Count: 5.5 10*3/uL (ref 4.0–10.5)
nRBC: 0 % (ref 0.0–0.2)

## 2023-08-17 LAB — CMP (CANCER CENTER ONLY)
ALT: 17 U/L (ref 0–44)
AST: 24 U/L (ref 15–41)
Albumin: 3.1 g/dL — ABNORMAL LOW (ref 3.5–5.0)
Alkaline Phosphatase: 46 U/L (ref 38–126)
Anion gap: 6 (ref 5–15)
BUN: 14 mg/dL (ref 6–20)
CO2: 27 mmol/L (ref 22–32)
Calcium: 9 mg/dL (ref 8.9–10.3)
Chloride: 103 mmol/L (ref 98–111)
Creatinine: 0.69 mg/dL (ref 0.44–1.00)
GFR, Estimated: >60 mL/min (ref >60)
Glucose, Bld: 135 mg/dL — ABNORMAL HIGH (ref 70–99)
Potassium: 3.1 mmol/L — ABNORMAL LOW (ref 3.5–5.1)
Sodium: 136 mmol/L (ref 135–145)
Total Bilirubin: 0.7 mg/dL (ref 0.0–1.2)
Total Protein: 6.8 g/dL (ref 6.5–8.1)

## 2023-08-17 MED ORDER — SODIUM CHLORIDE 0.9% FLUSH
10.0000 mL | INTRAVENOUS | Status: AC | PRN
  Administered 2023-08-17: 10 mL via INTRAVENOUS
  Filled 2023-08-17: qty 10

## 2023-08-17 MED ORDER — HEPARIN SOD (PORK) LOCK FLUSH 100 UNIT/ML IV SOLN
500.0000 [IU] | Freq: Once | INTRAVENOUS | Status: AC
  Administered 2023-08-17: 500 [IU] via INTRAVENOUS
  Filled 2023-08-17: qty 5

## 2023-08-17 MED ORDER — LEUPROLIDE ACETATE (3 MONTH) 11.25 MG IM KIT
11.2500 mg | PACK | Freq: Once | INTRAMUSCULAR | Status: AC
Start: 2023-08-17 — End: 2023-08-17
  Administered 2023-08-17: 11.25 mg via INTRAMUSCULAR
  Filled 2023-08-17: qty 11.25

## 2023-08-17 MED ORDER — LIDOCAINE-PRILOCAINE 2.5-2.5 % EX CREA: TOPICAL_CREAM | CUTANEOUS | 3 refills | Status: AC

## 2023-08-17 NOTE — Progress Notes (Signed)
 CT CAP 08/11/23.  C/o joint stiffness, is this from the tamoxifen?  Back of right heel hurts x1 month.  Only takes potassium rx every once in a while.  Refill emla, pended.

## 2023-08-17 NOTE — Assessment & Plan Note (Addendum)
#   MAY 2023- STAGE III-T4N1 ER/PR positive HER2 POSITIVE RIGHT breast cancer; LN-positive-ER/PR positive her-2 NEGATIVE; # s/p neoadjuvant chemotherapy- TCH plus P x 6 cycles- s/p Right mastectomy-[15th, NOv 2023] ypT2 [35mm]; ypN0.  Given partial response/involvement of the breast skin/multifocal disease- s/p RT- 08/27/2022. Patient currently on tamoxifen [DEC-JAN 2024]; + OFS- Lupron every 3 months ]. S/p  Ado-trastuzumab emtansine x 14 cycles.   # CAP- MARCH 2025- Interval right mastectomy with presumed postsurgical/thickening and stranding. Recommend follow-up. This is extending to the right axillary region anteriorly.   # MSK- G-2-3; likely secondary to endocrine side effects.-Regarding holding tamoxifen, however patient reluctant- will Proceed with recommend Osteobiflex OTC.    CT MARCH 2025- incidental- Stable small mediastinal prominent lymph nodes; There are 2 tiny sub 5 mm lung nodules which were not clearly seen previous.There are some prominent mesenteric nodes which are new from previous but still less than a cm short axis with slight stranding- will repeat imaging in  3 months.    # CT MARCH 2025- incidental- Fatty liver infiltration with mild splenomegaly and varices/ rule out  chronic liver disease- will refer to [facility].  # Mild Thrombocytopenia- >100  monitor for now. Stable.   # Allergic reaction to Ado-trastuzumab emtansine-fever/rash- currently on Claritin x3 days postchemotherapy; Tylenol preemptively.  Decreased Benadryl 25 mg given drowsiness. Stable.    # Hypocalcemia-  ON adjuvant Zometa q 6 months x 3 years [first- May-17th, 2024].   Continue ca+Vit D BID. Vit D FEB 2024- 54; [albumin 3.0]-  Stable.  # Right sided UE swelling/tightness- s/p  Maureen-  Stable.  # Infusion reaction - ZOmeta [flu like symtoms]- imprived with NSAIDs-  Stable.  # Diabetes- BG 148 [Fasting]- recently increased Farxiga 10 mg- [OFF ozempic/metformin- intolerance]-  Stable.  # Hypokalemia:  continue Kdur BID- Stable.  # PV access: port functional- # port/IV access-  flush q 6- 8 weeks for now.   Lupron 05/13/2023  q 107M; Zometa [over 30 mins] q 6 M- #1 10/22/22   # DISPOSITION: # [facility] care- Re: anxiety; hot flashes/ joint pains- on Tamoxifen+ Lupron  #refer to [facility] re: Fatty liver infiltration with mild splenomegaly and varices/ rule out  chronic liver disease- # Lupron today # port flush in 6 weeks # follow up in  3 months- MD;  port- labs; cbc/cmp; Lupron; zometa [over 30 mins] - CT CAP prior-Dr.B

## 2023-08-17 NOTE — Progress Notes (Signed)
 [facility] CONSULT NOTE  [name], [name], [name], [name]  CHIEF COMPLAINTS/PURPOSE OF CONSULTATION: Breast cancer  Oncology History Overview Note  MAY 2023-    Targeted ultrasound is performed, showing a 2.9 x 1.9 x 2.0 cm irregular hypoechoic mass right breast 10 o'clock position 6 cm from nipple at the site of palpable concern.   There is an abnormal 8 mm thickened right axillary lymph node.   There is skin thickening overlying the right breast. ------------------  A. BREAST, RIGHT AT 10:00, 6 CM FROM THE NIPPLE; ULTRASOUND-GUIDED CORE  NEEDLE BIOPSY:  - INVASIVE MAMMARY CARCINOMA, NO SPECIAL TYPE.   Size of invasive carcinoma: 11 mm in this sample  Histologic grade of invasive carcinoma: Grade 2                       Glandular/tubular differentiation score: 3                       Nuclear pleomorphism score: 2                       Mitotic rate score: 1                       Total score: 6  Ductal carcinoma in situ: Present, intermediate grade  Lymphovascular invasion: Not identified   B. LYMPH NODE, RIGHT AXILLARY; ULTRASOUND-GUIDED CORE NEEDLE BIOPSY:  - 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 IN GREATEST  EXTENT.   Comment:  The malignancy in the primary breast lesion appears morphologically  different from tumor in the lymph node sampling, with tumor present in  lymph node more suggestive of a histologic grade 3, with significantly  increased mitotic activity and pleomorphism. Due to the discrepancy in  these morphologic patterns, ER/PR/HER2 immunohistochemistry will be  performed on both blocks A1 and B1, with reflex to FISH for HER2 2+. The  results will be reported in an addendum.   ADDENDUM:  CASE SUMMARY: BREAST BIOMARKER TESTS - A - BREAST, RIGHT AT 10:00  Estrogen Receptor  (ER) Status: POSITIVE          Percentage of cells with nuclear positivity: 71-80%          Average intensity of staining: Strong   Progesterone Receptor (PgR) Status: POSITIVE          Percentage of cells with nuclear positivity: 81-90%          Average intensity of staining: Strong   HER2 (by immunohistochemistry): POSITIVE (Score 3+)       Percentage of cells with uniform intense complete membrane  staining: 61-70%  HER2 displays a heterogeneous staining pattern, with areas showing a  strong 3+ staining pattern, and other regions with complete absence (0+)  of staining.   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  Discussed with Dr. [name] tumor heterogeneity. BREAST MRI- JUNE 2023- The patient's known malignancy in the upper outer quadrant of the right breast measures 3.8 x 2.8 x 3.6 cm. Numerous other suspicious masses are identified in the right breast as above involving the upper outer and upper inner quadrants, located both anterior and posterior to the known malignancy.  Numerous enhancing foci in the skin as above are worrisome for skin metastases ; Bx proven. Biopsy proven metastatic node in the right axilla.  No evidence of malignancy in the left breast;  Two mildly prominent right internal mammary nodes were not FDG avid on recent PET-CT imaging.  Currently on neoadjuvant chemotherapy- TCH  plus P x6 cycles;  MUGA scan May 31st, 2023- -61%.    # June 7th, 2023- NEO-ADJUVANT CHEMO- TCH+P x6 cycles- s/p Mastectomy [NOV 15th, 2023] [Dr.Byrnett]-  ypT2 [76mm]; ypN0.  # JAN mid, 2024 -TAMOXFEN 20 mg a day; ADD LupronFEB 2nd.2 2024 s/p RT maech, 22nd- 2024  # FEB 2nd, 2023- Kadcyla q 3 W x14 cycles- finished dec 2024.   # Lupron q3 M [/start 2/07-2022]   # Genetic counseling s/p- NEG for any deleterious mutations.     Carcinoma of upper-outer quadrant of right breast in female, estrogen receptor positive (HCC)  10/26/2021 Initial Diagnosis   Carcinoma of upper-outer quadrant of right breast in female, estrogen receptor positive (HCC)   10/26/2021 Cancer Staging   Staging form: Breast, AJCC 8th Edition - Clinical: Stage IIIA (cT4d, cN1, cM0, G2, ER+, PR+, HER2+) - Signed by [name], MD on 06/04/2022 Histologic grading system: 3 grade system   11/10/2021 - 03/01/2022 Chemotherapy   Patient is on Treatment Plan : BREAST  Docetaxel + Carboplatin + Trastuzumab + Pertuzumab  (TCHP) q21d      11/11/2021 - 01/13/2022 Chemotherapy   Patient is on Treatment Plan : BREAST  Docetaxel + Carboplatin + Trastuzumab + Pertuzumab  (TCHP) q21d       Genetic Testing   Negative genetic testing. No pathogenic variants identified on the [facility] CancerNext-Expanded+RNA panel. The report date is 11/29/2021.  The CancerNext-Expanded + RNAinsight gene panel offered by [employer] and includes sequencing and rearrangement analysis for the following 77 genes: IP, ALK, APC*, ATM*, AXIN2, BAP1, BARD1, BLM, BMPR1A, BRCA1*, BRCA2*, BRIP1*, CDC73, CDH1*,CDK4, CDKN1B, CDKN2A, CHEK2*, CTNNA1, DICER1, FANCC, FH, FLCN, GALNT12, KIF1B, LZTR1, MAX, MEN1, MET, MLH1*, MSH2*, MSH3, MSH6*, MUTYH*, NBN, NF1*, NF2, NTHL1, PALB2*, PHOX2B, PMS2*, POT1, PRKAR1A, PTCH1, PTEN*, RAD51C*, RAD51D*,RB1, RECQL, RET, SDHA, SDHAF2, SDHB, SDHC, SDHD, SMAD4, SMARCA4, SMARCB1, SMARCE1, STK11, SUFU, TMEM127, TP53*,TSC1, TSC2, VHL and  XRCC2 (sequencing and deletion/duplication); EGFR, EGLN1, HOXB13, KIT, MITF, PDGFRA, POLD1 and POLE (sequencing only); EPCAM and GREM1 (deletion/duplication only).   07/09/2022 -  Chemotherapy   Patient is on Treatment Plan : BREAST ADO-Trastuzumab Emtansine (Kadcyla) q21d      HISTORY OF PRESENTING ILLNESS: Patient is ambulating independently.  With husband.  She  Marilyn Page 48 y.o.  female right breast TRIPLE POSITIVE with T4- Stage III -s/p mastectomy-with expanders-currently on adjuvant kadcyla / tamoxifen + OFS  is here for follow-up/ and review the results of the CT scan.  Patient complains of worsening joint pains.  Also achiness in the morning.  Also stiffness all  over.  Complains of worsening hot flashes.  Difficulty sleeping at night..  Associated with anxiety.  Continues to have mild tightness of right chest wall. Started on PT with [name]. Patient states she is non-currently compliant with therapy oral Potassium intermittently..  Mild  neuropathy.   Review of Systems  Constitutional:  Positive for malaise/fatigue. Negative for chills, diaphoresis, fever and weight loss.  HENT:  Negative for nosebleeds and sore throat.   Eyes:  Negative for double vision.  Respiratory:  Negative for cough, hemoptysis, sputum production, shortness of breath and wheezing.   Cardiovascular:  Negative for chest pain, palpitations, orthopnea and leg swelling.  Gastrointestinal:  Negative for abdominal pain, blood in stool, constipation, diarrhea, heartburn, melena, nausea and vomiting.  Genitourinary:  Negative for dysuria, frequency and urgency.  Musculoskeletal:  Positive for myalgias. Negative for back pain and joint pain.  Skin: Negative.  Negative for itching and rash.  Neurological:  Negative for dizziness, tingling, focal weakness, weakness and headaches.  Endo/Heme/Allergies:  Does not bruise/bleed easily.  Psychiatric/Behavioral:  Negative for depression. The patient is not  nervous/anxious and does not have insomnia.      MEDICAL HISTORY:  Past Medical History:  Diagnosis Date   Anemia    Carcinoma of upper-outer quadrant of right breast in female, estrogen receptor positive (HCC) 10/26/2021   Diabetes mellitus without complication (HCC)    Family history of adverse reaction to anesthesia    grandmother had issues but doesnt know what the issue was, she was older.   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 Axilla Butterfly hydro clip-MACRO METASTATIC MAMMARY CARCINOMA   BREAST BIOPSY Right 04/21/2022   [place] RT PLC BREAST LOC DEV   1ST LESION  INC US GUIDE 04/21/2022 [facility]   BREAST RECONSTRUCTION WITH PLACEMENT OF TISSUE EXPANDER AND FLEX HD (ACELLULAR HYDRATED DERMIS) Right 04/21/2022   Procedure: RIGHT BREAST RECONSTRUCTION WITH PLACEMENT OF TISSUE EXPANDER AND FLEX HD (ACELLULAR HYDRATED DERMIS);  Surgeon: Peggye Form, DO;  Location: [facility];  Service: Plastics;  Laterality: Right;   CESAREAN SECTION  2005/2008   DILATION AND CURETTAGE OF UTERUS  2003   PORTACATH PLACEMENT Left 11/09/2021   Procedure: INSERTION PORT-A-CATH;  Surgeon: Earline Mayotte, MD;  Location: [facility];  Service: General;  Laterality: Left;   REMOVAL OF TISSUE EXPANDER AND PLACEMENT OF IMPLANT Right 06/08/2022   Procedure: REMOVAL OF TISSUE EXPANDER;  Surgeon: Peggye Form, DO;  Location: [facility];  Service: Plastics;  Laterality: Right;   SIMPLE MASTECTOMY WITH AXILLARY SENTINEL NODE BIOPSY Right 04/21/2022   Procedure: SIMPLE MASTECTOMY WITH AXILLARY SENTINEL NODE BIOPSY;  Surgeon: Earline Mayotte,  MD;  Location: [facility];  Service: General;  Laterality: Right;  wire localization of lymph node   SKIN BIOPSY Right 11/09/2021   Procedure: PUNCH BIOPSY SKIN, TATTOO LYMPH NODE RIGHT AXILLA;  Surgeon: Earline Mayotte, MD;  Location: [facility];  Service: General;  Laterality: Right;   WISDOM TOOTH EXTRACTION      SOCIAL HISTORY: Social History   Socioeconomic History   Marital status: Married    Spouse name: Fraser Din  Number of children: 2   Years of education: Not on file   Highest education level: Not on file  Occupational History   Occupation: and Architectural technologist  Tobacco Use   Smoking status: Never   Smokeless tobacco: Never  Vaping Use   Vaping status: Never Used  Substance and Sexual Activity   Alcohol use: Never   Drug use: Never   Sexual activity: Yes    Birth control/protection: Other-see comments, Surgical    Comment: vasectomy  Other Topics Concern   Not on file  Social History Narrative   Pre-school teacher; in [place]; 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    Social Drivers of Corporate investment banker Strain: Not on file  Food Insecurity: Not on file  Transportation Needs: Not on file  Physical Activity: Inactive (09/26/2017)   Exercise Vital Sign    Days of Exercise per Week: 0 days    Minutes of Exercise per Session: 0 min  Stress: Not on file  Social Connections: Not on file  Intimate Partner Violence: Not on file    FAMILY HISTORY: Family History  Problem Relation Age of Onset   Ovarian cysts Mother    Migraines Mother    Melanoma Mother        on leg   Hyperlipidemia Father    Diabetes Maternal Aunt    Stomach cancer Paternal Aunt    Diabetes Maternal Grandfather    Lymphoma Cousin 17   Breast cancer Neg Hx     ALLERGIES:  is allergic to metformin and oxycodone.  MEDICATIONS:  Current Outpatient Medications  Medication Sig Dispense Refill   acetaminophen (TYLENOL) 325 MG tablet Take 2 tablets (650 mg total) by mouth every 6 (six) hours as  needed for mild pain (or Fever >/= 101).     ascorbic acid (VITAMIN C) 500 MG tablet Take 1 tablet (500 mg total) by mouth daily. 30 tablet    calcium-vitamin D (OSCAL WITH D) 500-5 MG-MCG tablet Take 1 tablet by mouth 2 (two) times daily. (Patient taking differently: Take 1 tablet by mouth daily with breakfast.) 60 tablet 2   Cyanocobalamin (VITAMIN B-12 PO) Take 1 tablet by mouth daily.     Dapagliflozin Propanediol (FARXIGA PO) Take 10 mg by mouth daily.     ondansetron (ZOFRAN) 4 MG tablet Take 1 tablet (4 mg total) by mouth every 8 (eight) hours as needed for up to 20 doses for nausea or vomiting. 20 tablet 0   ondansetron (ZOFRAN-ODT) 8 MG disintegrating tablet Take 8 mg by mouth every 8 (eight) hours as needed for nausea or vomiting.     ONETOUCH VERIO test strip SMARTSIG:1 Each Via Meter Daily PRN     pioglitazone (ACTOS) 15 MG tablet Take 15 mg by mouth daily.     prochlorperazine (COMPAZINE) 10 MG tablet Take 1 tablet (10 mg total) by mouth every 6 (six) hours as needed for nausea or vomiting. 40 tablet 1   tamoxifen (NOLVADEX) 20 MG tablet TAKE 1 TABLET BY MOUTH EVERY DAY 90 tablet 1   lidocaine-prilocaine (EMLA) cream Apply on the port. 30 -45 min  prior to port access. 30 g 3   potassium chloride (KLOR-CON) 20 MEQ packet TAKE 20 MEQ BY MOUTH 2 (TWO) TIMES DAILY. (Patient not taking: Reported on 08/17/2023) 180 packet 1   No current facility-administered medications for this visit.   Facility-Administered Medications Ordered in Other Visits  Medication Dose Route Frequency Provider Last Rate Last Admin   sodium chloride flush (  NS) 0.9 % injection 10 mL  10 mL Intravenous PRN [name], MD   10 mL at 08/17/23 1525      .  PHYSICAL EXAMINATION: ECOG PERFORMANCE STATUS: 0 - Asymptomatic  Vitals:   08/17/23 1551  BP: (!) 112/47  Pulse: 74  Resp: 20  Temp: 97.8 F (36.6 C)  SpO2: 100%     Filed Weights   08/17/23 1551  Weight: 221 lb (100.2 kg)      Physical Exam Vitals and nursing note reviewed.  HENT:     Head: Normocephalic and atraumatic.     Mouth/Throat:     Pharynx: Oropharynx is clear.  Eyes:     Extraocular Movements: Extraocular movements intact.     Pupils: Pupils are equal, round, and reactive to light.  Cardiovascular:     Rate and Rhythm: Normal rate and regular rhythm.  Pulmonary:     Comments: Decreased breath sounds bilaterally.  Abdominal:     Palpations: Abdomen is soft.  Musculoskeletal:        General: Normal range of motion.     Cervical back: Normal range of motion.  Skin:    General: Skin is warm.  Neurological:     General: No focal deficit present.     Mental Status: She is alert and oriented to person, place, and time.  Psychiatric:        Behavior: Behavior normal.        Judgment: Judgment normal.      LABORATORY DATA:  I have reviewed the data as listed Lab Results  Component Value Date   WBC 5.5 08/17/2023   HGB 11.5 (L) 08/17/2023   HCT 34.9 (L) 08/17/2023   MCV 88.8 08/17/2023   PLT 113 (L) 08/17/2023   Recent Labs    04/08/23 0831 05/13/23 1339 08/11/23 1456 08/17/23 1519  NA 138 140  --  136  K 3.4* 3.2*  --  3.1*  CL 105 106  --  103  CO2 25 26  --  27  GLUCOSE 184* 178*  --  135*  BUN 11 13  --  14  CREATININE 0.60 0.51 0.70 0.69  CALCIUM 8.6* 8.9  --  9.0  GFRNONAA >60 >60  --  >60  PROT 6.6 6.6  --  6.8  ALBUMIN 3.0* 3.1*  --  3.1*  AST 37 31  --  24  ALT 24 20  --  17  ALKPHOS 53 47  --  46  BILITOT 0.6 0.4  --  0.7    RADIOGRAPHIC STUDIES: I have personally reviewed the radiological images as listed and agreed with the findings in the report. CT CHEST ABDOMEN PELVIS W CONTRAST Result Date: 08/16/2023 CLINICAL DATA:  Invasive right-sided breast cancer. Status post chemotherapy. Restaging. * Tracking Code: BO * EXAM: CT CHEST, ABDOMEN, AND PELVIS WITH CONTRAST TECHNIQUE: Multidetector CT imaging of the chest, abdomen and pelvis was performed  following the standard protocol during bolus administration of intravenous contrast. RADIATION DOSE REDUCTION: This exam was performed according to the departmental dose-optimization program which includes automated exposure control, adjustment of the mA and/or kV according to patient size and/or use of iterative reconstruction technique. CONTRAST:  OMNIPAQUE IOHEXOL 300 MG/ML  SOLN COMPARISON:  CT 03/15/2022. FINDINGS: CT CHEST FINDINGS Cardiovascular: Left upper chest port. Catheter extends into the right atrium. Heart is nonenlarged. No pericardial effusion. The thoracic aorta is normal course and caliber. Pulsation artifact along the ascending aorta. Mediastinum/Nodes: Preserved thyroid gland.  Slightly patulous thoracic esophagus. Prior study was pre-surgical with mass in the lateral aspect of the right breast. Patient is status post right mastectomy with skin thickening and soft tissue thickening standing into the anterior right axillary region. These changes of thickening on series 2, image 20 are likely postsurgical. Attention on follow-up. No specific abnormal lymph node enlargement otherwise identified in the axillary regions, hilum, supraclavicular regions, chest wall. Previous node in the upper mediastinum next to the left common carotid artery is again seen. Previously this measured 8 mm in short axis and today on series 2, image 16 short axis of 9 mm and long axis of 14 mm. Small nodes elsewhere in the pre-vascular region are stable and not pathologically enlarged. Small precarinal region node is also stable. Lungs/Pleura: No consolidation, pneumothorax or effusion. There is breathing motion. Slight interstitial septal thickening along the anterior right hemithorax. Please correlate with treatment change. There is a 3 mm nodule in the right upper lobe on series 4, image 69. This is not clearly seen previously. 2 mm nodule superior segment left lower lobe on series 4, image 70 is also seen today  and was not clearly seen previously. These are nonspecific. Musculoskeletal: Curvature and degenerative changes along the spine. CT ABDOMEN PELVIS FINDINGS Hepatobiliary: Mild fatty liver infiltration. No space-occupying liver lesion. Gallbladder is present. Patent main portal vein. There are some varices identified with a recanalized paraumbilical vein as well as some varices along the lower esophagus. Please correlate for any known history of chronic liver disease. Pancreas: Unremarkable. No pancreatic ductal dilatation or surrounding inflammatory changes. Spleen: Left upper quadrant varices. Spleen has a diameter AP of 14.5 cm. Adrenals/Urinary Tract: Stable nodular thickening of the left adrenal gland. Preserved right adrenal gland. No enhancing renal mass or collecting system dilatation. The ureters have normal course and caliber extending down to the urinary bladder. Preserved contour to the urinary bladder. Stomach/Bowel: Moderate debris in the stomach. Small bowel is nondilated. Large bowel is normal course and caliber. Some redundant course to the colon. Left-sided colonic diverticula. Normal appendix seen in the right lower quadrant best seen on coronal imaging. Vascular/Lymphatic: Normal caliber aorta and IVC. Once again there is some small but prominent nodes identified in the retroperitoneum. Nodes in the mesentery are also noted but appears slightly larger today. Example left of midline central mesentery series 2, image 68 measures 16 by 11 mm. Same node is much smaller on the previous examination. There is slight stranding as well. Prominent upper retroperitoneal nodes near the celiac region and porta hepatis. Reproductive: Once again prominent uterus.  No adnexal mass Other: Slight mesenteric haziness.  No frank ascites or free air. Musculoskeletal: Mild degenerative changes along the spine. Posterior disc bulge and osteophytes along the lumbar spine from L2 through S1 with areas of canal  encroachment. These were seen previously. IMPRESSION: Interval right mastectomy with presumed postsurgical/thickening and stranding. Recommend follow-up. This is extending to the right axillary region anteriorly. Stable small mediastinal prominent lymph nodes. There are 2 tiny sub 5 mm lung nodules which were not clearly seen previous. Simple attention on follow-up as per the patient's neoplasm or in 6-12 months. Fatty liver infiltration with splenomegaly and varices. Please correlate for any history of known chronic liver disease. There are some prominent mesenteric nodes which are new from previous but still less than a cm short axis with slight stranding. Recommend attention on short follow-up and correlation with specific history. Few colonic diverticula. Diffuse colonic stool. Redundant course to the  nondilated colon. Electronically Signed   By: Karen Kays M.D.   On: 08/16/2023 16:59    ASSESSMENT & PLAN:   Carcinoma of upper-outer quadrant of right breast in female, estrogen receptor positive (HCC) # MAY 2023- STAGE III-T4N1 ER/PR positive HER2 POSITIVE RIGHT breast cancer; LN-positive-ER/PR positive her-2 NEGATIVE; # s/p neoadjuvant chemotherapy- TCH plus P x 6 cycles- s/p Right mastectomy-[15th, NOv 2023] ypT2 [22mm]; ypN0.  Given partial response/involvement of the breast skin/multifocal disease- s/p RT- 08/27/2022. Patient currently on tamoxifen [DEC-JAN 2024]; + OFS- Lupron every 3 months ]. S/p  Ado-trastuzumab emtansine x 14 cycles.   # CAP- MARCH 2025- Interval right mastectomy with presumed postsurgical/thickening and stranding. Recommend follow-up. This is extending to the right axillary region anteriorly.   # MSK- G-2-3; likely secondary to endocrine side effects.-Regarding holding tamoxifen, however patient reluctant- will Proceed with recommend Osteobiflex OTC.    CT MARCH 2025- incidental- Stable small mediastinal prominent lymph nodes; There are 2 tiny sub 5 mm lung nodules which  were not clearly seen previous.There are some prominent mesenteric nodes which are new from previous but still less than a cm short axis with slight stranding- will repeat imaging in  3 months.    # CT MARCH 2025- incidental- Fatty liver infiltration with mild splenomegaly and varices/ rule out  chronic liver disease- will refer to [facility].  # Mild Thrombocytopenia- >100  monitor for now. Stable.   # Allergic reaction to Ado-trastuzumab emtansine-fever/rash- currently on Claritin x3 days postchemotherapy; Tylenol preemptively.  Decreased Benadryl 25 mg given drowsiness. Stable.    # Hypocalcemia-  ON adjuvant Zometa q 6 months x 3 years [first- May-17th, 2024].   Continue ca+Vit D BID. Vit D FEB 2024- 54; [albumin 3.0]-  Stable.  # Right sided UE swelling/tightness- s/p  Maureen-  Stable.  # Infusion reaction - ZOmeta [flu like symtoms]- imprived with NSAIDs-  Stable.  # Diabetes- BG 148 [Fasting]- recently increased Farxiga 10 mg- [OFF ozempic/metformin- intolerance]-  Stable.  # Hypokalemia: continue Kdur BID- Stable.  # PV access: port functional- # port/IV access-  flush q 6- 8 weeks for now.   Lupron 05/13/2023  q 54M; Zometa [over 30 mins] q 6 M- #1 10/22/22   # DISPOSITION: # cerula care- Re: anxiety; hot flashes/ joint pains- on Tamoxifen+ Lupron  #refer to [facility] re: Fatty liver infiltration with mild splenomegaly and varices/ rule out  chronic liver disease- # Lupron today # port flush in 6 weeks # follow up in  3 months- MD;  port- labs; cbc/cmp; Lupron; zometa [over 30 mins] - CT CAP prior-Dr.B   All questions were answered. The patient/family knows to call the clinic with any problems, questions or concerns.     [name], MD 08/17/2023 4:34 PM

## 2023-08-19 ENCOUNTER — Ambulatory Visit: Payer: 59

## 2023-08-19 ENCOUNTER — Other Ambulatory Visit: Payer: 59

## 2023-08-19 ENCOUNTER — Ambulatory Visit: Payer: 59 | Admitting: Internal Medicine

## 2023-08-27 ENCOUNTER — Other Ambulatory Visit: Payer: Self-pay | Admitting: Internal Medicine

## 2023-08-29 ENCOUNTER — Encounter: Payer: Self-pay | Admitting: Internal Medicine

## 2023-09-05 DIAGNOSIS — F4323 Adjustment disorder with mixed anxiety and depressed mood: Secondary | ICD-10-CM | POA: Diagnosis not present

## 2023-09-05 DIAGNOSIS — C50411 Malignant neoplasm of upper-outer quadrant of right female breast: Secondary | ICD-10-CM | POA: Diagnosis not present

## 2023-09-08 ENCOUNTER — Telehealth: Payer: Self-pay

## 2023-09-08 NOTE — Telephone Encounter (Signed)
 I spoke with Aundra Millet with [facility], they have sch'd pts appt with Jacob Moores on 10/18/23 at 8:30am. Pt aware.

## 2023-09-27 NOTE — Progress Notes (Signed)
 [facility] Final Intake Summary  [name] ([birth year]) is a 48 y/o white female with stage III triple+ breast cancer first dx'd 10/2021, referred by Dr. Valentine Gasmen @ [facility] for anxiety & insomnia. Marilyn Page also has type 2 diabetes. Marilyn Page is not open to psychiatric medication recommendations.   Assessments:  Cindy scored PHQ-9=8 (mild), reporting fatigue, anhedonia, over-snacking when bored or feeling down, trouble staying asleep, and sadness relating to complications w/ her reconstruction surgery.  Cindy scored GAD-7=8 (mild), reporting nervousness and fear when passenger to her husband driving, anxiety around cancer-related scans/tests, restlessness (RLS), and worries about her kids. Marilyn Page feels her anxiety worsened around the time of her cancer dx and denied significant Hx of anxiety prior.   Somatic Symptoms: Marilyn Page reported tamoxifen -associated joint pain & hot flashes. The hot flashes in particular interfere with her sleep.   Psychosocial Hx: Marilyn Page lives with her husband and 2 daughters, 52 and 39. 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 She utilizes deep-breathing, napping, and distraction as coping strategies and additionally leans on her faith.    Assigned Dx: Z61.09: Adjustment disorder with mixed anxiety and depressed mood  -- Kodee Drury [facility]

## 2023-09-28 ENCOUNTER — Inpatient Hospital Stay: Attending: Internal Medicine

## 2023-09-28 DIAGNOSIS — Z452 Encounter for adjustment and management of vascular access device: Secondary | ICD-10-CM | POA: Diagnosis not present

## 2023-09-28 DIAGNOSIS — C50411 Malignant neoplasm of upper-outer quadrant of right female breast: Secondary | ICD-10-CM | POA: Insufficient documentation

## 2023-09-28 DIAGNOSIS — D500 Iron deficiency anemia secondary to blood loss (chronic): Secondary | ICD-10-CM

## 2023-09-28 MED ORDER — HEPARIN SOD (PORK) LOCK FLUSH 100 UNIT/ML IV SOLN
500.0000 [IU] | Freq: Once | INTRAVENOUS | Status: AC | PRN
  Administered 2023-09-28: 500 [IU]
  Filled 2023-09-28: qty 5

## 2023-09-28 MED ORDER — SODIUM CHLORIDE 0.9 % IV SOLN
Freq: Once | INTRAVENOUS | Status: DC
  Filled 2023-09-28: qty 250

## 2023-09-28 MED ORDER — SODIUM CHLORIDE 0.9% FLUSH
10.0000 mL | Freq: Once | INTRAVENOUS | Status: AC | PRN
  Administered 2023-09-28: 10 mL
  Filled 2023-09-28: qty 10

## 2023-10-03 ENCOUNTER — Ambulatory Visit: Payer: 59 | Admitting: Radiation Oncology

## 2023-10-05 DIAGNOSIS — C50411 Malignant neoplasm of upper-outer quadrant of right female breast: Secondary | ICD-10-CM | POA: Diagnosis not present

## 2023-10-05 DIAGNOSIS — F4323 Adjustment disorder with mixed anxiety and depressed mood: Secondary | ICD-10-CM | POA: Diagnosis not present

## 2023-10-17 ENCOUNTER — Ambulatory Visit
Admission: RE | Admit: 2023-10-17 | Discharge: 2023-10-17 | Disposition: A | Discharge status: Home / Self Care | Source: Ambulatory Visit | Attending: Radiation Oncology | Admitting: Radiation Oncology

## 2023-10-17 VITALS — BP 120/61 | HR 71 | Temp 98.0°F | Resp 16 | Ht 65.0 in | Wt 224.8 lb

## 2023-10-17 DIAGNOSIS — C50911 Malignant neoplasm of unspecified site of right female breast: Secondary | ICD-10-CM

## 2023-10-17 NOTE — Progress Notes (Signed)
 Radiation Oncology Follow up Note  Name: Marilyn Page   Date:   10/17/2023 MRN:  [id number] DOB: 09-11-1975    This 48 y.o. female presents to the clinic today for 52-month follow-up status post radiation therapy to right chest wall and peripheral lymphatics for stage IIa triple positive invasive mammary carcinoma status post neoadjuvant chemotherapy followed by MRM.  REFERRING PROVIDER: Efraim Grange, NP  HPI: Patient is a 48 year old female now out 13 months having completed right chest wall peripheral lymphatic radiation therapy for stage IIa triple positive invasive mammary carcinoma.  She is status post MRM status post neoadjuvant chemotherapy.  Seen today in routine follow-up she is doing well she is without complaint specifically denies any chest wall masses or nodularity cough or bone pain..  She had a CT scan back in March which I have reviewed showing stable small mediastinal prominent nodes some tiny sub-5 mm lung nodules some prominent mesenteric nodes which are new from previous but still less than a centimeter in short axis she has a repeat CT scan in June planned.  She is currently on tamoxifen  tolerating it well without side effect she has been referred to GI for the I believe mesenteric lymph nodes.  COMPLICATIONS OF TREATMENT: none  FOLLOW UP COMPLIANCE: keeps appointments   PHYSICAL EXAM:  BP 120/61   Pulse 71   Temp 98 F (36.7 C)   Resp 16   Ht 5\' 5"  (1.651 m)   Wt 224 lb 12.8 oz (102 kg)   BMI 37.41 kg/m  She is status post right MRM.  No evidence of mass or nodularity of chest wall is noted.  No axillary or supraclavicular adenopathy is appreciated.  Well-developed well-nourished patient in NAD. HEENT reveals PERLA, EOMI, discs not visualized.  Oral cavity is clear. No oral mucosal lesions are identified. Neck is clear without evidence of cervical or supraclavicular adenopathy. Lungs are clear to A&P. Cardiac examination is essentially unremarkable with regular  rate and rhythm without murmur rub or thrill. Abdomen is benign with no organomegaly or masses noted. Motor sensory and DTR levels are equal and symmetric in the upper and lower extremities. Cranial nerves II through XII are grossly intact. Proprioception is intact. No peripheral adenopathy or edema is identified. No motor or sensory levels are noted. Crude visual fields are within normal range.  RADIOLOGY RESULTS: Previous CT scans reviewed  PLAN: Present time patient is doing fairly well with no evidence of disease she has repeat CT scan scheduled for June which I will review when it becomes available.  Of otherwise asked to see her back in 1 year for follow-up.  Patient knows to call with any concerns.  I would like to take this opportunity to thank you for allowing me to participate in the care of your patient.Glenis Langdon, MD

## 2023-10-18 ENCOUNTER — Other Ambulatory Visit: Payer: Self-pay

## 2023-10-26 ENCOUNTER — Encounter: Payer: Self-pay | Admitting: Internal Medicine

## 2023-11-02 ENCOUNTER — Ambulatory Visit
Admission: RE | Admit: 2023-11-02 | Discharge: 2023-11-02 | Disposition: A | Discharge status: Home / Self Care | Attending: Internal Medicine | Admitting: Internal Medicine

## 2023-11-02 ENCOUNTER — Ambulatory Visit: Admitting: Anesthesiology

## 2023-11-02 ENCOUNTER — Encounter
Admission: RE | Disposition: A | Discharge status: Home / Self Care | Payer: Self-pay | Source: Home / Self Care | Attending: Internal Medicine

## 2023-11-02 DIAGNOSIS — D696 Thrombocytopenia, unspecified: Secondary | ICD-10-CM | POA: Insufficient documentation

## 2023-11-02 DIAGNOSIS — E119 Type 2 diabetes mellitus without complications: Secondary | ICD-10-CM | POA: Diagnosis not present

## 2023-11-02 DIAGNOSIS — K760 Fatty (change of) liver, not elsewhere classified: Secondary | ICD-10-CM | POA: Insufficient documentation

## 2023-11-02 DIAGNOSIS — R933 Abnormal findings on diagnostic imaging of other parts of digestive tract: Secondary | ICD-10-CM | POA: Insufficient documentation

## 2023-11-02 DIAGNOSIS — Z9221 Personal history of antineoplastic chemotherapy: Secondary | ICD-10-CM | POA: Diagnosis not present

## 2023-11-02 DIAGNOSIS — K3189 Other diseases of stomach and duodenum: Secondary | ICD-10-CM | POA: Insufficient documentation

## 2023-11-02 DIAGNOSIS — Z7984 Long term (current) use of oral hypoglycemic drugs: Secondary | ICD-10-CM | POA: Diagnosis not present

## 2023-11-02 DIAGNOSIS — Z9011 Acquired absence of right breast and nipple: Secondary | ICD-10-CM | POA: Diagnosis not present

## 2023-11-02 DIAGNOSIS — K2950 Unspecified chronic gastritis without bleeding: Secondary | ICD-10-CM | POA: Insufficient documentation

## 2023-11-02 DIAGNOSIS — K5909 Other constipation: Secondary | ICD-10-CM | POA: Insufficient documentation

## 2023-11-02 DIAGNOSIS — Z853 Personal history of malignant neoplasm of breast: Secondary | ICD-10-CM | POA: Diagnosis not present

## 2023-11-02 DIAGNOSIS — Z923 Personal history of irradiation: Secondary | ICD-10-CM | POA: Insufficient documentation

## 2023-11-02 DIAGNOSIS — I8500 Esophageal varices without bleeding: Secondary | ICD-10-CM | POA: Diagnosis not present

## 2023-11-02 HISTORY — PX: ESOPHAGOGASTRODUODENOSCOPY: SHX5428

## 2023-11-02 LAB — GLUCOSE, CAPILLARY: Glucose-Capillary: 103 mg/dL — ABNORMAL HIGH (ref 70–99)

## 2023-11-02 SURGERY — EGD (ESOPHAGOGASTRODUODENOSCOPY)
Anesthesia: General

## 2023-11-02 MED ORDER — LIDOCAINE HCL (PF) 2 % IJ SOLN
INTRAMUSCULAR | Status: AC
  Filled 2023-11-02: qty 5

## 2023-11-02 MED ORDER — SODIUM CHLORIDE 0.9 % IV SOLN
INTRAVENOUS | Status: DC
Start: 2023-11-02 — End: 2023-11-02
  Administered 2023-11-02: 20 mL/h via INTRAVENOUS

## 2023-11-02 MED ORDER — DEXMEDETOMIDINE HCL IN NACL 80 MCG/20ML IV SOLN
INTRAVENOUS | Status: DC | PRN
  Administered 2023-11-02: 20 ug via INTRAVENOUS

## 2023-11-02 MED ORDER — PROPOFOL 10 MG/ML IV BOLUS
INTRAVENOUS | Status: DC | PRN
  Administered 2023-11-02 (×2): 50 mg via INTRAVENOUS

## 2023-11-02 MED ORDER — LIDOCAINE HCL (CARDIAC) PF 100 MG/5ML IV SOSY
PREFILLED_SYRINGE | INTRAVENOUS | Status: DC | PRN
  Administered 2023-11-02: 80 mg via INTRAVENOUS

## 2023-11-02 MED ORDER — EPHEDRINE 5 MG/ML INJ
INTRAVENOUS | Status: AC
  Filled 2023-11-02: qty 5

## 2023-11-02 MED ORDER — PROPOFOL 500 MG/50ML IV EMUL
INTRAVENOUS | Status: DC | PRN
  Administered 2023-11-02: 75 ug/kg/min via INTRAVENOUS

## 2023-11-02 MED ORDER — GLYCOPYRROLATE 0.2 MG/ML IJ SOLN
INTRAMUSCULAR | Status: AC
  Filled 2023-11-02: qty 1

## 2023-11-02 MED ORDER — GLYCOPYRROLATE 0.2 MG/ML IJ SOLN
INTRAMUSCULAR | Status: DC | PRN
  Administered 2023-11-02: .2 mg via INTRAVENOUS

## 2023-11-02 NOTE — Anesthesia Preprocedure Evaluation (Addendum)
 Anesthesia Evaluation  Patient identified by MRN, date of birth, ID band Patient awake    Reviewed: Allergy & Precautions, NPO status , Patient's Chart, lab work & pertinent test results  History of Anesthesia Complications (+) history of anesthetic complications (high spinal)  Airway Mallampati: III  TM Distance: <3 FB Neck ROM: full    Dental  (+) Chipped   Pulmonary neg pulmonary ROS, neg shortness of breath   Pulmonary exam normal        Cardiovascular Exercise Tolerance: Good negative cardio ROS Normal cardiovascular exam     Neuro/Psych  Neuromuscular disease  negative psych ROS   GI/Hepatic negative GI ROS, Neg liver ROS,neg GERD  ,,  Endo/Other  diabetes, Type 2    Renal/GU negative Renal ROS  negative genitourinary   Musculoskeletal   Abdominal   Peds  Hematology negative hematology ROS (+)   Anesthesia Other Findings Patient is NPO appropriate and reports no nausea or vomiting today.  Past Medical History: No date: Anemia 10/26/2021: Carcinoma of upper-outer quadrant of right breast in  female, estrogen receptor positive (HCC) No date: Diabetes mellitus without complication (HCC) No date: Family history of adverse reaction to anesthesia     Comment:  grandmother had issues but doesnt know what the issue               was, she was older. 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 Axilla Butterfly hydro clip-MACRO METASTATIC               MAMMARY CARCINOMA 04/21/2022: BREAST BIOPSY; Right     Comment:  US  RT  PLC BREAST LOC DEV   1ST LESION  INC US  GUIDE               04/21/2022 [facility] 04/21/2022: BREAST RECONSTRUCTION WITH PLACEMENT OF TISSUE EXPANDER  AND FLEX HD (ACELLULAR HYDRATED DERMIS); Right     Comment:  Procedure: RIGHT BREAST RECONSTRUCTION WITH PLACEMENT OF              TISSUE EXPANDER AND FLEX HD (ACELLULAR HYDRATED DERMIS);               Surgeon: Thornell Flirt, DO;  Location: [facility] ORS;                Service: Plastics;  Laterality: Right; 2005/2008: CESAREAN SECTION 2003: DILATION AND CURETTAGE OF UTERUS 11/09/2021: PORTACATH PLACEMENT; Left     Comment:  Procedure: INSERTION PORT-A-CATH;  Surgeon: Marshall Skeeter, MD;  Location: [facility] ORS;  Service: General;                Laterality: Left; 06/08/2022: REMOVAL OF TISSUE EXPANDER AND PLACEMENT OF IMPLANT; Right     Comment:  Procedure: REMOVAL OF TISSUE EXPANDER;  Surgeon:               Thornell Flirt, DO;  Location: [facility] OR;  Service:  Plastics;  Laterality: Right; 04/21/2022: SIMPLE MASTECTOMY WITH AXILLARY SENTINEL NODE BIOPSY;  Right     Comment:  Procedure: SIMPLE MASTECTOMY WITH AXILLARY SENTINEL NODE              BIOPSY;  Surgeon: Marshall Skeeter, MD;  Location: [facility]              ORS;  Service: General;  Laterality: Right;  wire               localization of lymph node 11/09/2021: SKIN BIOPSY; Right     Comment:  Procedure: PUNCH BIOPSY SKIN, TATTOO LYMPH NODE RIGHT               AXILLA;  Surgeon: Marshall Skeeter, MD;  Location: [facility]              ORS;  Service: General;  Laterality: Right; No date: WISDOM TOOTH EXTRACTION  BMI    Body Mass Index: 35.99 kg/m      Reproductive/Obstetrics negative OB ROS                             Anesthesia Physical Anesthesia Plan  ASA: 3  Anesthesia Plan: General   Post-op Pain Management:    Induction: Intravenous  PONV Risk Score and Plan: Propofol  infusion and TIVA  Airway Management  Planned: Natural Airway and Nasal Cannula  Additional Equipment:   Intra-op Plan:   Post-operative Plan:   Informed Consent: I have reviewed the patients History and Physical, chart, labs and discussed the procedure including the risks, benefits and alternatives for the proposed anesthesia with the patient or authorized representative who has indicated his/her understanding and acceptance.     Dental Advisory Given  Plan Discussed with: Anesthesiologist, CRNA and Surgeon  Anesthesia Plan Comments: (Patient consented for risks of anesthesia including but not limited to:  - adverse reactions to medications - risk of airway placement if required - damage to eyes, teeth, lips or other oral mucosa - nerve damage due to positioning  - sore throat or hoarseness - Damage to heart, brain, nerves, lungs, other parts of body or loss of life  Patient voiced understanding and assent.)       Anesthesia Quick Evaluation

## 2023-11-02 NOTE — Transfer of Care (Signed)
 Immediate Anesthesia Transfer of Care Note  Patient: Marilyn Page  Procedure(s) Performed: EGD (ESOPHAGOGASTRODUODENOSCOPY)  Patient Location: PACU  Anesthesia Type:General  Level of Consciousness: sedated  Airway & Oxygen Therapy: Patient Spontanous Breathing  Post-op Assessment: Report given to RN and Post -op Vital signs reviewed and stable  Post vital signs: Reviewed and stable  Last Vitals:  Vitals Value Taken Time  BP 139/67 11/02/23 1019  Temp 35.8 C 11/02/23 1019  Pulse 80 11/02/23 1020  Resp 19 11/02/23 1020  SpO2 96 % 11/02/23 1020  Vitals shown include unfiled device data.  Last Pain:  Vitals:   11/02/23 1019  TempSrc: Temporal  PainSc:          Complications: No notable events documented.

## 2023-11-02 NOTE — Anesthesia Postprocedure Evaluation (Signed)
 Anesthesia Post Note  Patient: ATINA FEELEY  Procedure(s) Performed: EGD (ESOPHAGOGASTRODUODENOSCOPY)  Patient location during evaluation: Endoscopy Anesthesia Type: General Level of consciousness: awake and alert Pain management: pain level controlled Vital Signs Assessment: post-procedure vital signs reviewed and stable Respiratory status: spontaneous breathing, nonlabored ventilation, respiratory function stable and patient connected to nasal cannula oxygen Cardiovascular status: blood pressure returned to baseline and stable Postop Assessment: no apparent nausea or vomiting Anesthetic complications: no   No notable events documented.   Last Vitals:  Vitals:   11/02/23 1029 11/02/23 1039  BP: 139/77 (!) 153/79  Pulse: 82 71  Resp: 19 15  Temp:    SpO2: 97% 97%    Last Pain:  Vitals:   11/02/23 1019  TempSrc: Temporal  PainSc: 0-No pain                 Portia Brittle Lynessa Almanzar

## 2023-11-02 NOTE — Op Note (Signed)
 [facility] Gastroenterology Patient Name: Marilyn Page Procedure Date: 11/02/2023 9:53 AM MRN: [id number] Account #: [id number] Date of Birth: 1975/08/20 Admit Type: Outpatient Age: 48 Room: [facility] ENDO ROOM 3 Gender: Female Note Status: Finalized Instrument Name: Upper Endoscope [id number] Procedure:             Upper GI endoscopy Indications:           Variceal screening (no known varices or prior                         bleeding), Abnormal CT of the GI tract Providers:             Saron Tweed K. Corky Diener MD, MD Referring MD:          Odilia Bennett, MD (Referring MD) Medicines:             Propofol  per Anesthesia Complications:         No immediate complications. Estimated blood loss:                         Minimal. Procedure:             Pre-Anesthesia Assessment:                        - The risks and benefits of the procedure and the                         sedation options and risks were discussed with the                         patient. All questions were answered and informed                         consent was obtained.                        - Patient identification and proposed procedure were                         verified prior to the procedure by the nurse. The                         procedure was verified in the procedure room.                        - ASA Grade Assessment: III - A patient with severe                         systemic disease.                        - After reviewing the risks and benefits, the patient                         was deemed in satisfactory condition to undergo the                         procedure.  After obtaining informed consent, the endoscope was                         passed under direct vision. Throughout the procedure,                         the patient's blood pressure, pulse, and oxygen                         saturations were monitored continuously. The Endoscope                         was  introduced through the mouth, and advanced to the                         third part of duodenum. The upper GI endoscopy was                         accomplished without difficulty. The patient tolerated                         the procedure well. Findings:      The examined esophagus was normal.      There is no endoscopic evidence of mucosal abnormalities or varices in       the entire esophagus.      Diffuse mildly erythematous mucosa without bleeding was found in the       entire examined stomach. Biopsies were taken with a cold forceps for       histology. Estimated blood loss was minimal.      There is no endoscopic evidence of hiatal hernia or varices in the       entire examined stomach.      The examined duodenum was normal. Impression:            - Normal esophagus.                        - Erythematous mucosa in the stomach. Biopsied.                        - Normal examined duodenum. Recommendation:        - Patient has a contact number available for                         emergencies. The signs and symptoms of potential                         delayed complications were discussed with the patient.                         Return to normal activities tomorrow. Written                         discharge instructions were provided to the patient.                        - Resume previous diet.                        -  Continue present medications.                        - Await pathology results.                        - Follow up with Marilyn Mazzoni, PA-C in the GI office.                         [phone]                        - Telephone GI office to schedule appointment at the                         next available appointment.                        - The findings and recommendations were discussed with                         the patient. Procedure Code(s):     --- Professional ---                        [phone], Esophagogastroduodenoscopy, flexible,                          transoral; with biopsy, single or multiple Diagnosis Code(s):     --- Professional ---                        R93.3, Abnormal findings on diagnostic imaging of                         other parts of digestive tract                        Z13.810, Encounter for screening for upper                         gastrointestinal disorder                        K31.89, Other diseases of stomach and duodenum CPT copyright 2022 American Medical Association. All rights reserved. The codes documented in this report are preliminary and upon coder review may  be revised to meet current compliance requirements. Cassie Click MD, MD 11/02/2023 10:21:02 AM This report has been signed electronically. Number of Addenda: 0 Note Initiated On: 11/02/2023 9:53 AM Estimated Blood Loss:  Estimated blood loss was minimal.      [facility]

## 2023-11-02 NOTE — H&P (Signed)
 Outpatient short stay form Pre-procedure 11/02/2023 9:51 AM Marilyn Page, M.D.  Primary Physician: Naaman Au, NP  Reason for visit:    Diagnoses  Idiopathic esophageal varices without bleeding (CMS/HHS-HCC)  Fatty infiltration of liver  Splenomegaly  Thrombocytopenia ()  Abnormal CT of the abdomen      History of present illness:  Marilyn Page presents to the [facility] GI clinic at the request of Dr. Govinda Brahmanday for chief complaint of abnormal CT scan.  She has PMH of metastatic breast cancer diagnosed May 2023 s/p neoadjuvant chemotherapy and right mastectomy 04/2022 s/p radiation therapy 08/2022. She had CT chest/abd/pelvis performed in March 2025 for breast cancer re-staging s/p treatment which commented on fatty liver infiltration with splenomegaly and varices and new prominent mesenteric nodes new from previous. There is no previous documentation of chronic liver disease. She denies any previous diagnosis of cirrhosis of the liver or fatty live disease. No known family history of chronic liver disease. No history of GI bleeding. She denies any acute liver-related symptoms such as jaundice, pruritus, altered mental status, abdominal swelling, lower extremity edema, hematochezia, melena, nausea, or vomiting. Labs have showed thrombocytopenia dating back to June 2024. Most recent labs in March this year showed platelets 113K. No major changes in her bowel habits. She does struggle with chronic constipation at baseline. Her bowels haven't been regular in years. She can have 1-3 bowel movements a week if she is lucky. No previous screening colonoscopy. She did have negative Cologuard in December 2023. She denies any loss of appetite or unintentional weight loss. She denies any complaints of dysphagia, odynophagia, early satiety, or epigastric abdominal pain.     Current Facility-Administered Medications:    0.9 %  sodium chloride  infusion, , Intravenous, Continuous, [place], Jakyrie Totherow K,  MD, Last Rate: 20 mL/hr at 11/02/23 1610, Continued from Pre-op at 11/02/23 9604  Facility-Administered Medications Ordered in Other Encounters:    sodium chloride  flush (NS) 0.9 % injection 10 mL, 10 mL, Intravenous, PRN, Brahmanday, Govinda R, MD, 10 mL at 08/17/23 1525  Medications Prior to Admission  Medication Sig Dispense Refill Last Dose/Taking   acetaminophen  (TYLENOL ) 325 MG tablet Take 2 tablets (650 mg total) by mouth every 6 (six) hours as needed for mild pain (or Fever >/= 101).   Past Week   ascorbic acid  (VITAMIN C) 500 MG tablet Take 1 tablet (500 mg total) by mouth daily. 30 tablet  Past Week   calcium -vitamin D  (OSCAL WITH D) 500-5 MG-MCG tablet Take 1 tablet by mouth 2 (two) times daily. (Patient taking differently: Take 1 tablet by mouth daily with breakfast.) 60 tablet 2 Past Week   Cyanocobalamin  (VITAMIN B-12 PO) Take 1 tablet by mouth daily.   Past Week   Dapagliflozin Propanediol (FARXIGA PO) Take 10 mg by mouth daily.   Past Week   lidocaine -prilocaine  (EMLA ) cream Apply on the port. 30 -45 min  prior to port access. 30 g 3 11/01/2023   ondansetron  (ZOFRAN ) 4 MG tablet Take 1 tablet (4 mg total) by mouth every 8 (eight) hours as needed for up to 20 doses for nausea or vomiting. 20 tablet 0 11/01/2023   ondansetron  (ZOFRAN -ODT) 8 MG disintegrating tablet Take 8 mg by mouth every 8 (eight) hours as needed for nausea or vomiting.   11/01/2023   ONETOUCH VERIO test strip SMARTSIG:1 Each Via Meter Daily PRN   11/01/2023   pioglitazone (ACTOS) 15 MG tablet Take 15 mg by mouth daily.   11/01/2023   tamoxifen  (  NOLVADEX ) 20 MG tablet TAKE 1 TABLET BY MOUTH EVERY DAY 90 tablet 1 11/01/2023   potassium chloride  (KLOR-CON ) 20 MEQ packet TAKE 20 MEQ BY MOUTH 2 (TWO) TIMES DAILY. (Patient not taking: Reported on 08/17/2023) 180 packet 1    prochlorperazine  (COMPAZINE ) 10 MG tablet Take 1 tablet (10 mg total) by mouth every 6 (six) hours as needed for nausea or vomiting. 40 tablet 1       Allergies  Allergen Reactions   Metformin Other (See Comments)   Oxycodone  Nausea And Vomiting     Past Medical History:  Diagnosis Date   Anemia    Carcinoma of upper-outer quadrant of right breast in female, estrogen receptor positive (HCC) 10/26/2021   Diabetes mellitus without complication (HCC)    Family history of adverse reaction to anesthesia    grandmother had issues but doesnt know what the issue was, she was older.   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    Physical Exam  Gen: Alert, oriented. Appears stated age.  HEENT: [place]/AT. PERRLA. Lungs: CTA, no wheezes. CV: RR nl S1, S2. Abd: soft, benign, no masses. BS+ Ext: No edema. Pulses 2+    Planned procedures: Proceed with EGD. The patient understands the nature of the planned procedure, indications, risks, alternatives and potential complications including but not limited to bleeding, infection, perforation, damage to internal organs and possible oversedation/side effects from anesthesia. The patient agrees and gives consent to proceed.  Please refer to procedure notes for findings, recommendations and patient disposition/instructions.     Marilyn Page K. Corky Page, M.D. Gastroenterology 11/02/2023  9:51 AM

## 2023-11-03 ENCOUNTER — Encounter: Payer: Self-pay | Admitting: Internal Medicine

## 2023-11-03 LAB — SURGICAL PATHOLOGY

## 2023-11-07 ENCOUNTER — Ambulatory Visit
Admission: RE | Admit: 2023-11-07 | Discharge: 2023-11-07 | Disposition: A | Discharge status: Home / Self Care | Source: Ambulatory Visit | Attending: Internal Medicine | Admitting: Internal Medicine

## 2023-11-07 DIAGNOSIS — C50411 Malignant neoplasm of upper-outer quadrant of right female breast: Secondary | ICD-10-CM | POA: Insufficient documentation

## 2023-11-07 DIAGNOSIS — Z170 Estrogen receptor positive status [ER+]: Secondary | ICD-10-CM | POA: Insufficient documentation

## 2023-11-07 MED ORDER — IOHEXOL 300 MG/ML  SOLN
100.0000 mL | Freq: Once | INTRAMUSCULAR | Status: AC | PRN
  Administered 2023-11-07: 100 mL via INTRAVENOUS

## 2023-11-11 ENCOUNTER — Other Ambulatory Visit: Payer: 59

## 2023-11-11 ENCOUNTER — Ambulatory Visit: Payer: 59

## 2023-11-11 ENCOUNTER — Ambulatory Visit: Payer: 59 | Admitting: Internal Medicine

## 2023-11-16 ENCOUNTER — Other Ambulatory Visit

## 2023-11-16 ENCOUNTER — Ambulatory Visit: Admitting: Internal Medicine

## 2023-11-16 ENCOUNTER — Ambulatory Visit

## 2023-11-18 ENCOUNTER — Inpatient Hospital Stay: Attending: Internal Medicine | Admitting: Internal Medicine

## 2023-11-18 ENCOUNTER — Other Ambulatory Visit: Payer: Self-pay

## 2023-11-18 ENCOUNTER — Inpatient Hospital Stay

## 2023-11-18 ENCOUNTER — Encounter: Payer: Self-pay | Admitting: Internal Medicine

## 2023-11-18 ENCOUNTER — Inpatient Hospital Stay: Attending: Internal Medicine

## 2023-11-18 VITALS — BP 110/57 | HR 69 | Temp 97.7°F | Resp 16 | Ht 66.0 in | Wt 229.2 lb

## 2023-11-18 DIAGNOSIS — Z79810 Long term (current) use of selective estrogen receptor modulators (SERMs): Secondary | ICD-10-CM | POA: Diagnosis not present

## 2023-11-18 DIAGNOSIS — R770 Abnormality of albumin: Secondary | ICD-10-CM

## 2023-11-18 DIAGNOSIS — Z170 Estrogen receptor positive status [ER+]: Secondary | ICD-10-CM

## 2023-11-18 DIAGNOSIS — E876 Hypokalemia: Secondary | ICD-10-CM | POA: Insufficient documentation

## 2023-11-18 DIAGNOSIS — E119 Type 2 diabetes mellitus without complications: Secondary | ICD-10-CM | POA: Insufficient documentation

## 2023-11-18 DIAGNOSIS — C50411 Malignant neoplasm of upper-outer quadrant of right female breast: Secondary | ICD-10-CM

## 2023-11-18 DIAGNOSIS — D696 Thrombocytopenia, unspecified: Secondary | ICD-10-CM | POA: Insufficient documentation

## 2023-11-18 DIAGNOSIS — F419 Anxiety disorder, unspecified: Secondary | ICD-10-CM | POA: Insufficient documentation

## 2023-11-18 DIAGNOSIS — N951 Menopausal and female climacteric states: Secondary | ICD-10-CM | POA: Diagnosis not present

## 2023-11-18 DIAGNOSIS — E8809 Other disorders of plasma-protein metabolism, not elsewhere classified: Secondary | ICD-10-CM | POA: Insufficient documentation

## 2023-11-18 DIAGNOSIS — Z452 Encounter for adjustment and management of vascular access device: Secondary | ICD-10-CM | POA: Insufficient documentation

## 2023-11-18 DIAGNOSIS — D500 Iron deficiency anemia secondary to blood loss (chronic): Secondary | ICD-10-CM

## 2023-11-18 LAB — CMP (CANCER CENTER ONLY)
ALT: 16 U/L (ref 0–44)
AST: 23 U/L (ref 15–41)
Albumin: 2.9 g/dL — ABNORMAL LOW (ref 3.5–5.0)
Alkaline Phosphatase: 57 U/L (ref 38–126)
Anion gap: 7 (ref 5–15)
BUN: 13 mg/dL (ref 6–20)
CO2: 25 mmol/L (ref 22–32)
Calcium: 8 mg/dL — ABNORMAL LOW (ref 8.9–10.3)
Chloride: 106 mmol/L (ref 98–111)
Creatinine: 0.58 mg/dL (ref 0.44–1.00)
GFR, Estimated: >60 mL/min (ref >60)
Glucose, Bld: 163 mg/dL — ABNORMAL HIGH (ref 70–99)
Potassium: 3.3 mmol/L — ABNORMAL LOW (ref 3.5–5.1)
Sodium: 138 mmol/L (ref 135–145)
Total Bilirubin: 0.4 mg/dL (ref 0.0–1.2)
Total Protein: 6.3 g/dL — ABNORMAL LOW (ref 6.5–8.1)

## 2023-11-18 LAB — CBC WITH DIFFERENTIAL (CANCER CENTER ONLY)
Abs Immature Granulocytes: 0.02 10*3/uL (ref 0.00–0.07)
Basophils Absolute: 0 10*3/uL (ref 0.0–0.1)
Basophils Relative: 0 %
Eosinophils Absolute: 0.2 10*3/uL (ref 0.0–0.5)
Eosinophils Relative: 3 %
HCT: 35.5 % — ABNORMAL LOW (ref 36.0–46.0)
Hemoglobin: 11.8 g/dL — ABNORMAL LOW (ref 12.0–15.0)
Immature Granulocytes: 0 %
Lymphocytes Relative: 28 %
Lymphs Abs: 1.3 10*3/uL (ref 0.7–4.0)
MCH: 28.9 pg (ref 26.0–34.0)
MCHC: 33.2 g/dL (ref 30.0–36.0)
MCV: 87 fL (ref 80.0–100.0)
Monocytes Absolute: 0.4 10*3/uL (ref 0.1–1.0)
Monocytes Relative: 8 %
Neutro Abs: 2.8 10*3/uL (ref 1.7–7.7)
Neutrophils Relative %: 61 %
Platelet Count: 118 10*3/uL — ABNORMAL LOW (ref 150–400)
RBC: 4.08 MIL/uL (ref 3.87–5.11)
RDW: 13.5 % (ref 11.5–15.5)
WBC Count: 4.7 10*3/uL (ref 4.0–10.5)
nRBC: 0 % (ref 0.0–0.2)

## 2023-11-18 LAB — PROTEIN / CREATININE RATIO, URINE
Creatinine, Urine: 70 mg/dL
Protein Creatinine Ratio: 0.37 mg/mg{creat} — ABNORMAL HIGH (ref 0.00–0.15)
Total Protein, Urine: 26 mg/dL

## 2023-11-18 MED ORDER — LEUPROLIDE ACETATE (3 MONTH) 11.25 MG IM KIT
11.2500 mg | PACK | Freq: Once | INTRAMUSCULAR | Status: AC
  Administered 2023-11-18: 11.25 mg via INTRAMUSCULAR
  Filled 2023-11-18: qty 11.25

## 2023-11-18 MED ORDER — HEPARIN SOD (PORK) LOCK FLUSH 100 UNIT/ML IV SOLN
500.0000 [IU] | Freq: Once | INTRAVENOUS | Status: AC
  Administered 2023-11-18: 500 [IU] via INTRAVENOUS
  Filled 2023-11-18: qty 5

## 2023-11-18 NOTE — Assessment & Plan Note (Addendum)
#   MAY 2023- STAGE III-T4N1 ER/PR positive HER2 POSITIVE RIGHT breast cancer; LN-positive-ER/PR positive her-2 NEGATIVE; # s/p neoadjuvant chemotherapy- TCH plus P x 6 cycles- s/p Right mastectomy-[15th, NOv 2023] ypT2 [37mm]; ypN0.  Given partial response/involvement of the breast skin/multifocal disease- s/p RT- 08/27/2022. Patient currently on tamoxifen  [DEC-JAN 2024]; + OFS- Lupron  every 3 months ]. S/p  Ado-trastuzumab emtansine  x 14 cycles.   # JUNE 2nd, 2025- Stable examination without evidence of new or progressive disease in the chest, abdomen or pelvis;  Stable prominent mediastinal, mesenteric and retroperitoneal lymph nodes; Stable scattered pulmonary nodules. Monitor for now- will plan imaging in 4-6 months.   # MSK- G-1-2; likely secondary to endocrine side effects.-CONTINUE Tamoxifen , continue Osteobiflex OTC. Recommend evaluation- with podiatry re: right heel pain/ achilles heel.    # CT MARCH 2025- incidental- Fatty liver infiltration with mild splenomegaly and varices/ rule out  chronic liver disease- - s/p KC- GI evaluation-  MAY 2025- EGD-negative.   # Mild Thrombocytopenia- >100  monitor for now. Stable.   # Hypocalcemia-  ON adjuvant Zometa  q 6 months x 3 years [first- May-17th, 2024].   Continue ca+Vit D BID. Vit D FEB 2024- 54; [albumin 3.0]-  Stable.  # Right sided UE swelling/tightness- s/p  Maureen-  Stable.  # Infusion reaction - ZOmeta  [flu like symtoms]- imprived with NSAIDs-  Stable.   # Diabetes- BG 168 [post lunch]- on  Farxiga 10 mg- [OFF ozempic/metformin- intolerance]; on Farxiga-noted to have weight gain.  Defer to PCP.  # Hypoalbuminemia-2.9 unclear etiology.  Low concerns for malnutrition.  Check urine random protein creatinine ratio  #  Anxiety; hot flashes/ joint pains- s/p cerula care- improved/stable [on happy juice]  # Hypokalemia: continue Kdur BID- Stable.  # PV access: port functional- # port/IV access-  flush q 6- 8 weeks for now.   Lupron   05/13/2023  q 33M; Zometa  [over 30 mins] q 6 M- #1 10/22/22   # DISPOSITION: # urine test today # Lupron  today; NO Zometa  # port flush in 6 weeks # follow up in  3 months- MD;  port- labs; cbc/cmp; Lupron ; zometa  [over 30 mins] - -Dr.B

## 2023-11-18 NOTE — Progress Notes (Signed)
 CT CAP 11/2823.  Endoscopy done 11/02/23, Dr. Corky Diener.  C/o back of her heels still hurt/tight. Feet feel like a pulling at times.

## 2023-11-18 NOTE — Progress Notes (Signed)
 I connected with Marilyn Page on 11/18/23 at  1:30 PM EDT by video enabled telemedicine visit and verified that I am speaking with the correct person using two identifiers.  I discussed the limitations, risks, security and privacy concerns of performing an evaluation and management service by telemedicine and the availability of in-person appointments. I also discussed with the patient that there may be a patient responsible charge related to this service. The patient expressed understanding and agreed to proceed.    Other persons participating in the visit and their role in the encounter: RN/medical reconciliation Patient's location: office Provider's location: home  Oncology History Overview Note  MAY 2023-    Targeted ultrasound is performed, showing a 2.9 x 1.9 x 2.0 cm irregular hypoechoic mass right breast 10 o'clock position 6 cm from nipple at the site of palpable concern.   There is an abnormal 8 mm thickened right axillary lymph node.   There is skin thickening overlying the right breast. ------------------  A. BREAST, RIGHT AT 10:00, 6 CM FROM THE NIPPLE; ULTRASOUND-GUIDED CORE  NEEDLE BIOPSY:  - INVASIVE MAMMARY CARCINOMA, NO SPECIAL TYPE.   Size of invasive carcinoma: 11 mm in this sample  Histologic grade of invasive carcinoma: Grade 2                       Glandular/tubular differentiation score: 3                       Nuclear pleomorphism score: 2                       Mitotic rate score: 1                       Total score: 6  Ductal carcinoma in situ: Present, intermediate grade  Lymphovascular invasion: Not identified   B. LYMPH NODE, RIGHT AXILLARY; ULTRASOUND-GUIDED CORE NEEDLE BIOPSY:  - 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 IN GREATEST  EXTENT.   Comment:  The malignancy in the primary breast lesion appears morphologically  different from tumor in the lymph node sampling, with tumor present in  lymph node more suggestive of a  histologic grade 3, with significantly  increased mitotic activity and pleomorphism. Due to the discrepancy in  these morphologic patterns, ER/PR/HER2 immunohistochemistry will be  performed on both blocks A1 and B1, with reflex to FISH for HER2 2+. The  results will be reported in an addendum.   ADDENDUM:  CASE SUMMARY: BREAST BIOMARKER TESTS - A - BREAST, RIGHT AT 10:00  Estrogen Receptor (ER) Status: POSITIVE          Percentage of cells with nuclear positivity: 71-80%          Average intensity of staining: Strong   Progesterone Receptor (PgR) Status: POSITIVE          Percentage of cells with nuclear positivity: 81-90%          Average intensity of staining: Strong   HER2 (by immunohistochemistry): POSITIVE (Score 3+)       Percentage of cells with uniform intense complete membrane  staining: 61-70%  HER2 displays a heterogeneous staining pattern, with areas showing a  strong 3+ staining pattern, and other regions with complete absence (0+)  of staining.   Ki-67: Not performed   CASE SUMMARY: BREAST BIOMARKER TESTS - B - RIGHT AXILLARY LYMPH NODE  Estrogen Receptor (ER)  Status: POSITIVE          Percentage of cells with nuclear positivity: 81-90%          Average intensity of staining: Strong   Progesterone Receptor (PgR) Status: POSITIVE          Percentage of cells with nuclear positivity: 51-60%          Average intensity of staining: Strong   HER2 (by immunohistochemistry): NEGATIVE (Score 1+)  Ki-67: Not performed   #  MAY 2023- STAGE III-T4N1 ER/PR positive HER2 POSITIVE right breast cancer;LN-positive-ER/PR positive however 2 NEGATIVE s/p biopsy [see discussion below].  Discussed with Dr. Clayburn Curb tumor heterogeneity. BREAST MRI- JUNE 2023- The patient's known malignancy in the upper outer quadrant of the right breast measures 3.8 x 2.8 x 3.6 cm. Numerous other suspicious masses are identified in the right breast as above involving the upper outer and upper inner  quadrants, located both anterior and posterior to the known malignancy.  Numerous enhancing foci in the skin as above are worrisome for skin metastases ; Bx proven. Biopsy proven metastatic node in the right axilla.  No evidence of malignancy in the left breast;  Two mildly prominent right internal mammary nodes were not FDG avid on recent PET-CT imaging.  Currently on neoadjuvant chemotherapy- TCH plus P x6 cycles;  MUGA scan May 31st, 2023- -61%.    # June 7th, 2023- NEO-ADJUVANT CHEMO- TCH+P x6 cycles- s/p Mastectomy [NOV 15th, 2023] [Dr.Byrnett]-  ypT2 [55mm]; ypN0.  # JAN mid, 2024 -TAMOXFEN 20 mg a day; ADD LupronFEB 2nd.2 2024 s/p RT maech, 22nd- 2024  # FEB 2nd, 2023- Kadcyla  q 3 W x14 cycles- finished dec 2024.   # Lupron  q3 M [/start 2/07-2022]   # Genetic counseling s/p- NEG for any deleterious mutations.     Carcinoma of upper-outer quadrant of right breast in female, estrogen receptor positive (HCC)  10/26/2021 Initial Diagnosis   Carcinoma of upper-outer quadrant of right breast in female, estrogen receptor positive (HCC)   10/26/2021 Cancer Staging   Staging form: Breast, AJCC 8th Edition - Clinical: Stage IIIA (cT4d, cN1, cM0, G2, ER+, PR+, HER2+) - Signed by Gwyn Leos, MD on 06/04/2022 Histologic grading system: 3 grade system   11/10/2021 - 03/01/2022 Chemotherapy   Patient is on Treatment Plan : BREAST  Docetaxel  + Carboplatin  + Trastuzumab  + Pertuzumab   (TCHP) q21d      11/11/2021 - 01/13/2022 Chemotherapy   Patient is on Treatment Plan : BREAST  Docetaxel  + Carboplatin  + Trastuzumab  + Pertuzumab   (TCHP) q21d       Genetic Testing   Negative genetic testing. No pathogenic variants identified on the [facility] CancerNext-Expanded+RNA panel. The report date is 11/29/2021.  The CancerNext-Expanded + RNAinsight gene panel offered by [employer] and includes sequencing and rearrangement analysis for the following 77 genes: IP, ALK, APC*, ATM*, AXIN2, BAP1, BARD1, BLM,  BMPR1A, BRCA1*, BRCA2*, BRIP1*, CDC73, CDH1*,CDK4, CDKN1B, CDKN2A, CHEK2*, CTNNA1, DICER1, FANCC, FH, FLCN, GALNT12, KIF1B, LZTR1, MAX, MEN1, MET, MLH1*, MSH2*, MSH3, MSH6*, MUTYH*, NBN, NF1*, NF2, NTHL1, PALB2*, PHOX2B, PMS2*, POT1, PRKAR1A, PTCH1, PTEN*, RAD51C*, RAD51D*,RB1, RECQL, RET, SDHA, SDHAF2, SDHB, SDHC, SDHD, SMAD4, SMARCA4, SMARCB1, SMARCE1, STK11, SUFU, TMEM127, TP53*,TSC1, TSC2, VHL and XRCC2 (sequencing and deletion/duplication); EGFR, EGLN1, HOXB13, KIT, MITF, PDGFRA, POLD1 and POLE (sequencing only); EPCAM and GREM1 (deletion/duplication only).   07/09/2022 -  Chemotherapy   Patient is on Treatment Plan : BREAST ADO-Trastuzumab Emtansine  (Kadcyla ) q21d      Chief Complaint: breast cancer  History of present illness:Marilyn Page 48 y.o.  female with history of triple positive breast cancer currently on tamoxifen  is here for follow-up/review of the CT scan.  Patient notes to have improvement of her musculoskeletal pain overall since she started taking the Osteo Bi-Flex.  However notes to have ongoing right heel pain.  No trauma.  She is currently not taking Osteo Bi-Flex.  Hot flashes have improved-on happy juice.   Denies any new lumps or bumps.  Appetite is good.    Follow-up noted to have weight gain of over 20 pounds in the last 5 to 6 months. States that she has not been checking her blood sugars recently.  Denies any leg swelling or any shortness of breath or cough.  Observation/objective: Alert & oriented x 3. In No acute distress.   Assessment and plan: Carcinoma of upper-outer quadrant of right breast in female, estrogen receptor positive (HCC) # MAY 2023- STAGE III-T4N1 ER/PR positive HER2 POSITIVE RIGHT breast cancer; LN-positive-ER/PR positive her-2 NEGATIVE; # s/p neoadjuvant chemotherapy- TCH plus P x 6 cycles- s/p Right mastectomy-[15th, NOv 2023] ypT2 [60mm]; ypN0.  Given partial response/involvement of the breast skin/multifocal disease- s/p RT- 08/27/2022.  Patient currently on tamoxifen  [DEC-JAN 2024]; + OFS- Lupron  every 3 months ]. S/p  Ado-trastuzumab emtansine  x 14 cycles.   # JUNE 2nd, 2025- Stable examination without evidence of new or progressive disease in the chest, abdomen or pelvis;  Stable prominent mediastinal, mesenteric and retroperitoneal lymph nodes; Stable scattered pulmonary nodules. Monitor for now- will plan imaging in 4-6 months.   # MSK- G-1-2; likely secondary to endocrine side effects.-CONTINUE Tamoxifen , continue Osteobiflex OTC. Recommend evaluation- with podiatry re: right heel pain/ achilles heel.    # CT MARCH 2025- incidental- Fatty liver infiltration with mild splenomegaly and varices/ rule out  chronic liver disease- - s/p [facility]- GI evaluation-  MAY 2025- EGD-negative.   # Mild Thrombocytopenia- >100  monitor for now. Stable.   # Hypocalcemia-  ON adjuvant Zometa  q 6 months x 3 years [first- May-17th, 2024].   Continue ca+Vit D BID. Vit D FEB 2024- 54; [albumin 3.0]-  Stable.  # Right sided UE swelling/tightness- s/p  Maureen-  Stable.  # Infusion reaction - ZOmeta  [flu like symtoms]- imprived with NSAIDs-  Stable.   # Diabetes- BG 168 [post lunch]- on  Farxiga 10 mg- [OFF ozempic/metformin- intolerance]; on Farxiga-noted to have weight gain.  Defer to PCP.  # Hypoalbuminemia-2.9 unclear etiology.  Low concerns for malnutrition.  Check urine random protein creatinine ratio  #  Anxiety; hot flashes/ joint pains- s/p cerula care- improved/stable [on happy juice]  # Hypokalemia: continue Kdur BID- Stable.  # PV access: port functional- # port/IV access-  flush q 6- 8 weeks for now.   Lupron  05/13/2023  q 86M; Zometa  [over 30 mins] q 6 M- #1 10/22/22   # DISPOSITION: # urine test today # Lupron  today; NO Zometa  # port flush in 6 weeks # follow up in  3 months- MD;  port- labs; cbc/cmp; Lupron ; zometa  [over 30 mins] - -Dr.B   Follow-up instructions:  I discussed the assessment and treatment plan with the  patient.  The patient was provided an opportunity to ask questions and all were answered.  The patient agreed with the plan and demonstrated understanding of instructions.  The patient was advised to call back or seek an in person evaluation if the symptoms worsen or if the condition fails to improve as anticipated.    Dr.  Hedda Liv [facility] at [facility] 11/18/2023 2:43 PM

## 2023-11-21 ENCOUNTER — Other Ambulatory Visit: Payer: Self-pay | Admitting: Gastroenterology

## 2023-11-21 DIAGNOSIS — R161 Splenomegaly, not elsewhere classified: Secondary | ICD-10-CM

## 2023-11-21 DIAGNOSIS — K760 Fatty (change of) liver, not elsewhere classified: Secondary | ICD-10-CM

## 2023-11-21 DIAGNOSIS — I8500 Esophageal varices without bleeding: Secondary | ICD-10-CM

## 2023-11-25 ENCOUNTER — Ambulatory Visit
Admission: RE | Admit: 2023-11-25 | Discharge: 2023-11-25 | Disposition: A | Discharge status: Home / Self Care | Source: Ambulatory Visit | Attending: Gastroenterology | Admitting: Gastroenterology

## 2023-11-25 DIAGNOSIS — I8500 Esophageal varices without bleeding: Secondary | ICD-10-CM | POA: Insufficient documentation

## 2023-11-25 DIAGNOSIS — K760 Fatty (change of) liver, not elsewhere classified: Secondary | ICD-10-CM | POA: Insufficient documentation

## 2023-11-25 DIAGNOSIS — R161 Splenomegaly, not elsewhere classified: Secondary | ICD-10-CM | POA: Diagnosis present

## 2023-12-20 ENCOUNTER — Other Ambulatory Visit: Payer: Self-pay | Admitting: Internal Medicine

## 2023-12-20 DIAGNOSIS — Z1231 Encounter for screening mammogram for malignant neoplasm of breast: Secondary | ICD-10-CM

## 2023-12-21 ENCOUNTER — Other Ambulatory Visit: Payer: Self-pay | Admitting: Internal Medicine

## 2023-12-21 DIAGNOSIS — E876 Hypokalemia: Secondary | ICD-10-CM

## 2023-12-29 ENCOUNTER — Other Ambulatory Visit: Payer: Self-pay | Admitting: Medical Genetics

## 2023-12-30 ENCOUNTER — Other Ambulatory Visit
Admission: RE | Admit: 2023-12-30 | Discharge: 2023-12-30 | Disposition: A | Discharge status: Home / Self Care | Payer: Self-pay | Source: Ambulatory Visit | Attending: Medical Genetics | Admitting: Medical Genetics

## 2023-12-30 ENCOUNTER — Inpatient Hospital Stay: Attending: Internal Medicine

## 2023-12-30 DIAGNOSIS — C50411 Malignant neoplasm of upper-outer quadrant of right female breast: Secondary | ICD-10-CM | POA: Diagnosis present

## 2023-12-30 DIAGNOSIS — Z452 Encounter for adjustment and management of vascular access device: Secondary | ICD-10-CM | POA: Diagnosis not present

## 2023-12-30 DIAGNOSIS — Z95828 Presence of other vascular implants and grafts: Secondary | ICD-10-CM

## 2023-12-30 MED ORDER — HEPARIN SOD (PORK) LOCK FLUSH 100 UNIT/ML IV SOLN
500.0000 [IU] | Freq: Once | INTRAVENOUS | Status: AC
  Administered 2023-12-30: 500 [IU] via INTRAVENOUS
  Filled 2023-12-30: qty 5

## 2023-12-30 MED ORDER — SODIUM CHLORIDE 0.9% FLUSH
10.0000 mL | Freq: Once | INTRAVENOUS | Status: AC
  Administered 2023-12-30: 10 mL via INTRAVENOUS
  Filled 2023-12-30: qty 10

## 2024-01-05 ENCOUNTER — Ambulatory Visit
Admission: RE | Admit: 2024-01-05 | Discharge: 2024-01-05 | Disposition: A | Discharge status: Home / Self Care | Source: Ambulatory Visit | Attending: Internal Medicine | Admitting: Internal Medicine

## 2024-01-05 DIAGNOSIS — Z1231 Encounter for screening mammogram for malignant neoplasm of breast: Secondary | ICD-10-CM | POA: Insufficient documentation

## 2024-01-13 LAB — GENECONNECT MOLECULAR SCREEN: Genetic Analysis Overall Interpretation: NEGATIVE

## 2024-02-03 NOTE — Progress Notes (Signed)
 [facility] - Discharge Date of Service: Feb 03 2024 Time of Service: 5:21 PM  Discharge Plan Member has been discharged from [facility]. Please see below Discharge Plan for details. The Member has been provided a copy of their Discharge Plan via the [facility] Care portal. If you have any questions, please contact our Clinical Team at [phone].  Name Marilyn Page  Date of Birth 1975-12-18  Discharged Reason Goals Met: Member-determined  Discharging to: Referring Specialist  Referring Provider: Govinda Raghuram Brahmanday   Current Psychiatric Medications 1. None Psychiatric Medication Recommendation Transition N/A (no CC med recs made)  Referrals: None   Discharge Summary Care Delivered: Member referred and enrolled to [facility] in March 2025. During the course of care, Member engaged in Care Management and single consult with Health Coach.  Interim evaluation (if available): Summary of interim assessments [Member improved / worsened from initial evaluation to now, etc...].  Between baseline (PHQ-9=8) and most recent assessment (PHQ-9=0), Member reports 100% reduction in depressive symptoms.  Between baseline (GAD-7=8) and most recent assessment (GAD-7=3), Member reports 62% reduction in anxiety symptoms.  Member reports she is ready to transition out of [facility] Care services but has enjoyed and valued [facility] care.  Patient reports she is in a better place now because of services.  Current status: Reason for discharge: Member requested- goals met  Should referring organization contact?: Member is not interested in additional support  [facility] remains available should patient express interest in the future.  Final Intake Summary Marilyn Page a.k.a. Marilyn Page is a 48 y/o white female with stage III triple+ breast cancer first dx'd 10/2021, referred by Dr. Rennie @ [facility] for anxiety & insomnia. Marilyn also has type 2 diabetes. Marilyn is not open to  psychiatric medication recommendations. Assessments: Cindy scored PHQ-9=8 (mild), reporting fatigue, anhedonia, over-snacking when bored or feeling down, trouble staying asleep, and sadness relating to complications w/ her reconstruction surgery. Cindy scored GAD-7=8 (mild), reporting nervousness and fear when passenger to her husband driving, anxiety around cancer-related scans/tests, restlessness (RLS), and worries about her kids. Marilyn feels her anxiety worsened around the time of her cancer dx and denied significant Hx of anxiety prior. Somatic Symptoms: Marilyn reported tamoxifen-associated joint pain & hot flashes. The hot flashes in particular interfere with her sleep. Psychosocial Hx: Marilyn lives with her husband and 2 daughters, 67 and 18. 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 She utilizes deep-breathing, napping, and distraction as coping strategies and additionally leans on her faith.  [facility] Provider: Jorene Arloa Specking, [facility] Health Care Manager

## 2024-02-03 NOTE — Progress Notes (Deleted)
   The note originally documented on this encounter has been moved the the encounter in which it belongs.

## 2024-02-03 NOTE — Progress Notes (Signed)
   The note originally documented on this encounter has been moved the the encounter in which it belongs.

## 2024-02-19 ENCOUNTER — Other Ambulatory Visit: Payer: Self-pay | Admitting: Internal Medicine

## 2024-02-20 ENCOUNTER — Ambulatory Visit: Admitting: Internal Medicine

## 2024-02-20 ENCOUNTER — Ambulatory Visit

## 2024-02-20 ENCOUNTER — Encounter: Payer: Self-pay | Admitting: Internal Medicine

## 2024-02-20 ENCOUNTER — Other Ambulatory Visit

## 2024-02-22 ENCOUNTER — Inpatient Hospital Stay

## 2024-02-22 ENCOUNTER — Inpatient Hospital Stay (HOSPITAL_BASED_OUTPATIENT_CLINIC_OR_DEPARTMENT_OTHER): Admitting: Internal Medicine

## 2024-02-22 ENCOUNTER — Inpatient Hospital Stay: Attending: Internal Medicine

## 2024-02-22 ENCOUNTER — Encounter: Payer: Self-pay | Admitting: Internal Medicine

## 2024-02-22 VITALS — BP 122/75 | HR 69 | Temp 97.6°F | Resp 16 | Ht 66.0 in | Wt 231.6 lb

## 2024-02-22 DIAGNOSIS — M25561 Pain in right knee: Secondary | ICD-10-CM

## 2024-02-22 DIAGNOSIS — C773 Secondary and unspecified malignant neoplasm of axilla and upper limb lymph nodes: Secondary | ICD-10-CM | POA: Diagnosis not present

## 2024-02-22 DIAGNOSIS — C50411 Malignant neoplasm of upper-outer quadrant of right female breast: Secondary | ICD-10-CM

## 2024-02-22 DIAGNOSIS — Z79810 Long term (current) use of selective estrogen receptor modulators (SERMs): Secondary | ICD-10-CM | POA: Insufficient documentation

## 2024-02-22 DIAGNOSIS — N951 Menopausal and female climacteric states: Secondary | ICD-10-CM | POA: Insufficient documentation

## 2024-02-22 DIAGNOSIS — C50211 Malignant neoplasm of upper-inner quadrant of right female breast: Secondary | ICD-10-CM | POA: Diagnosis not present

## 2024-02-22 DIAGNOSIS — E876 Hypokalemia: Secondary | ICD-10-CM | POA: Diagnosis not present

## 2024-02-22 DIAGNOSIS — R918 Other nonspecific abnormal finding of lung field: Secondary | ICD-10-CM | POA: Diagnosis not present

## 2024-02-22 DIAGNOSIS — Z170 Estrogen receptor positive status [ER+]: Secondary | ICD-10-CM | POA: Insufficient documentation

## 2024-02-22 DIAGNOSIS — D696 Thrombocytopenia, unspecified: Secondary | ICD-10-CM | POA: Insufficient documentation

## 2024-02-22 DIAGNOSIS — R770 Abnormality of albumin: Secondary | ICD-10-CM

## 2024-02-22 DIAGNOSIS — E119 Type 2 diabetes mellitus without complications: Secondary | ICD-10-CM | POA: Insufficient documentation

## 2024-02-22 DIAGNOSIS — D500 Iron deficiency anemia secondary to blood loss (chronic): Secondary | ICD-10-CM

## 2024-02-22 DIAGNOSIS — M25572 Pain in left ankle and joints of left foot: Secondary | ICD-10-CM | POA: Diagnosis not present

## 2024-02-22 DIAGNOSIS — M25461 Effusion, right knee: Secondary | ICD-10-CM | POA: Diagnosis not present

## 2024-02-22 LAB — CMP (CANCER CENTER ONLY)
ALT: 21 U/L (ref 0–44)
AST: 32 U/L (ref 15–41)
Albumin: 3.3 g/dL — ABNORMAL LOW (ref 3.5–5.0)
Alkaline Phosphatase: 57 U/L (ref 38–126)
Anion gap: 9 (ref 5–15)
BUN: 13 mg/dL (ref 6–20)
CO2: 24 mmol/L (ref 22–32)
Calcium: 9 mg/dL (ref 8.9–10.3)
Chloride: 104 mmol/L (ref 98–111)
Creatinine: 0.73 mg/dL (ref 0.44–1.00)
GFR, Estimated: >60 mL/min (ref >60)
Glucose, Bld: 184 mg/dL — ABNORMAL HIGH (ref 70–99)
Potassium: 3.2 mmol/L — ABNORMAL LOW (ref 3.5–5.1)
Sodium: 137 mmol/L (ref 135–145)
Total Bilirubin: 0.7 mg/dL (ref 0.0–1.2)
Total Protein: 6.6 g/dL (ref 6.5–8.1)

## 2024-02-22 LAB — CBC WITH DIFFERENTIAL (CANCER CENTER ONLY)
Abs Immature Granulocytes: 0.01 K/uL (ref 0.00–0.07)
Basophils Absolute: 0 K/uL (ref 0.0–0.1)
Basophils Relative: 1 %
Eosinophils Absolute: 0.1 K/uL (ref 0.0–0.5)
Eosinophils Relative: 2 %
HCT: 36.2 % (ref 36.0–46.0)
Hemoglobin: 12.3 g/dL (ref 12.0–15.0)
Immature Granulocytes: 0 %
Lymphocytes Relative: 26 %
Lymphs Abs: 1.3 K/uL (ref 0.7–4.0)
MCH: 28.9 pg (ref 26.0–34.0)
MCHC: 34 g/dL (ref 30.0–36.0)
MCV: 85.2 fL (ref 80.0–100.0)
Monocytes Absolute: 0.3 K/uL (ref 0.1–1.0)
Monocytes Relative: 6 %
Neutro Abs: 3.4 K/uL (ref 1.7–7.7)
Neutrophils Relative %: 65 %
Platelet Count: 134 K/uL — ABNORMAL LOW (ref 150–400)
RBC: 4.25 MIL/uL (ref 3.87–5.11)
RDW: 13.6 % (ref 11.5–15.5)
WBC Count: 5.1 K/uL (ref 4.0–10.5)
nRBC: 0 % (ref 0.0–0.2)

## 2024-02-22 MED ORDER — SODIUM CHLORIDE 0.9 % IV SOLN
Freq: Once | INTRAVENOUS | Status: AC
  Filled 2024-02-22: qty 250

## 2024-02-22 MED ORDER — ZOLEDRONIC ACID 4 MG/100ML IV SOLN
4.0000 mg | Freq: Once | INTRAVENOUS | Status: AC
  Administered 2024-02-22: 4 mg via INTRAVENOUS
  Filled 2024-02-22: qty 100

## 2024-02-22 MED ORDER — LEUPROLIDE ACETATE (3 MONTH) 11.25 MG IM KIT
11.2500 mg | PACK | Freq: Once | INTRAMUSCULAR | Status: AC
  Administered 2024-02-22: 11.25 mg via INTRAMUSCULAR
  Filled 2024-02-22: qty 11.25

## 2024-02-22 NOTE — Assessment & Plan Note (Addendum)
#   MAY 2023- STAGE III-T4N1 ER/PR positive HER2 POSITIVE RIGHT breast cancer; LN-positive-ER/PR positive her-2 NEGATIVE; # s/p neoadjuvant chemotherapy- TCH plus P x 6 cycles- s/p Right mastectomy-[15th, NOv 2023] ypT2 [51mm]; ypN0.  Given partial response/involvement of the breast skin/multifocal disease- s/p RT- 08/27/2022. Patient currently on tamoxifen  [DEC-JAN 2024]; + OFS- Lupron  every 3 months ]. S/p  Ado-trastuzumab emtansine  x 14 cycles.   # JUNE 2nd, 2025- Stable examination without evidence of new or progressive disease in the chest, abdomen or pelvis;  Stable prominent mediastinal, mesenteric and retroperitoneal lymph nodes; Stable scattered pulmonary nodules. Monitor for now- will plan imaging in 4-6 months.   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 # [facility] re: right knee pain   # CT MARCH 2025- incidental- Fatty liver infiltration with mild splenomegaly and varices/ rule out  chronic liver disease- - s/p [facility]- GI evaluation-  MAY 2025- EGD-negative/mild gastritis-ultrasound suggestive of fatty liver.  # Mild Thrombocytopenia- >100  monitor for now. Stable.   # Hypocalcemia-  ON adjuvant Zometa  q 6 months x 3 years [first- May-17th, 2024].   Continue ca+Vit D BID. Vit D FEB 2024- 54; [albumin 3.0]-  Stable.  # Right sided UE swelling/tightness- s/p  Maureen-  Stable.  # Infusion reaction - ZOmeta  [flu like symtoms]- imprived with NSAIDs-  Stable.   # Diabetes- BG 168 [post lunch]- on  Farxiga 10 mg- [OFF ozempic/metformin- intolerance]; on Farxiga-noted to have weight gain.  Defer to PCP.  # Hypokalemia: continue Kdur powder daily-  stable.  # PV access: port functional- # port/IV access-  flush q 6- 8 weeks for now.   Lupron  05/13/2023  q 49M; Zometa  [over 30 mins] q 6 M- #1 10/22/22   # DISPOSITION: # [facility] re: right knee pain # Lupron  today; Zometa  today # port flush in 6 weeks # follow up in  3 months- MD;  port- labs; cbc/cmp; vit D 25-OH- Lupron -   -Dr.B

## 2024-02-22 NOTE — Progress Notes (Unsigned)
 [facility] CONSULT NOTE  Patient Care Team: [name], NP as PCP - General (Family Medicine) [name], RN as Oncology Nurse Navigator [name], [name]  CHIEF COMPLAINTS/PURPOSE OF CONSULTATION: Breast cancer  Oncology History Overview Note  MAY 2023-    Targeted ultrasound is performed, showing a 2.9 x 1.9 x 2.0 cm irregular hypoechoic mass right breast 10 o'clock position 6 cm from nipple at the site of palpable concern.   There is an abnormal 8 mm thickened right axillary lymph node.   There is skin thickening overlying the right breast. ------------------  A. BREAST, RIGHT AT 10:00, 6 CM FROM THE NIPPLE; ULTRASOUND-GUIDED CORE  NEEDLE BIOPSY:  - INVASIVE MAMMARY CARCINOMA, NO SPECIAL TYPE.   Size of invasive carcinoma: 11 mm in this sample  Histologic grade of invasive carcinoma: Grade 2                       Glandular/tubular differentiation score: 3                       Nuclear pleomorphism score: 2                       Mitotic rate score: 1                       Total score: 6  Ductal carcinoma in situ: Present, intermediate grade  Lymphovascular invasion: Not identified   B. LYMPH NODE, RIGHT AXILLARY; ULTRASOUND-GUIDED CORE NEEDLE BIOPSY:  - 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 IN GREATEST  EXTENT.   Comment:  The malignancy in the primary breast lesion appears morphologically  different from tumor in the lymph node sampling, with tumor present in  lymph node more suggestive of a histologic grade 3, with significantly  increased mitotic activity and pleomorphism. Due to the discrepancy in  these morphologic patterns, ER/PR/HER2 immunohistochemistry will be  performed on both blocks A1 and B1, with reflex to FISH for HER2 2+. The  results will be reported in an addendum.   ADDENDUM:  CASE SUMMARY: BREAST BIOMARKER TESTS - A - BREAST, RIGHT AT 10:00  Estrogen Receptor  (ER) Status: POSITIVE          Percentage of cells with nuclear positivity: 71-80%          Average intensity of staining: Strong   Progesterone Receptor (PgR) Status: POSITIVE          Percentage of cells with nuclear positivity: 81-90%          Average intensity of staining: Strong   HER2 (by immunohistochemistry): POSITIVE (Score 3+)       Percentage of cells with uniform intense complete membrane  staining: 61-70%  HER2 displays a heterogeneous staining pattern, with areas showing a  strong 3+ staining pattern, and other regions with complete absence (0+)  of staining.   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  Discussed with Dr. Mabeline tumor heterogeneity. BREAST MRI- JUNE 2023- The patient's known malignancy in the upper outer quadrant of the right breast measures 3.8 x 2.8 x 3.6 cm. Numerous other suspicious masses are identified in the right breast as above involving the upper outer and upper inner quadrants, located both anterior and posterior to the known malignancy.  Numerous enhancing foci in the skin as above are worrisome for skin metastases ; Bx proven. Biopsy proven metastatic node in the right axilla.  No evidence of malignancy in the left breast;  Two mildly prominent right internal mammary nodes were not FDG avid on recent PET-CT imaging.  Currently on neoadjuvant chemotherapy- TCH  plus P x6 cycles;  MUGA scan May 31st, 2023- -61%.    # June 7th, 2023- NEO-ADJUVANT CHEMO- TCH+P x6 cycles- s/p Mastectomy [NOV 15th, 2023] [Dr.Byrnett]-  ypT2 [27mm]; ypN0.  # JAN mid, 2024 -TAMOXFEN 20 mg a day; ADD LupronFEB 2nd.2 2024 s/p RT maech, 22nd- 2024  # FEB 2nd, 2023- Kadcyla  q 3 W x14 cycles- finished dec 2024.   # Lupron  q3 M [/start 2/07-2022]   # Genetic counseling s/p- NEG for any deleterious mutations.     Carcinoma of upper-outer quadrant of right breast in female, estrogen receptor positive (HCC)  10/26/2021 Initial Diagnosis   Carcinoma of upper-outer quadrant of right breast in female, estrogen receptor positive (HCC)   10/26/2021 Cancer Staging   Staging form: Breast, AJCC 8th Edition - Clinical: Stage IIIA (cT4d, cN1, cM0, G2, ER+, PR+, HER2+) - Signed by [name], MD on 06/04/2022 Histologic grading system: 3 grade system   11/10/2021 - 03/01/2022 Chemotherapy   Patient is on Treatment Plan : BREAST  Docetaxel  + Carboplatin  + Trastuzumab  + Pertuzumab   (TCHP) q21d      11/11/2021 - 01/13/2022 Chemotherapy   Patient is on Treatment Plan : BREAST  Docetaxel  + Carboplatin  + Trastuzumab  + Pertuzumab   (TCHP) q21d       Genetic Testing   Negative genetic testing. No pathogenic variants identified on the [facility] CancerNext-Expanded+RNA panel. The report date is 11/29/2021.  The CancerNext-Expanded + RNAinsight gene panel offered by [employer] and includes sequencing and rearrangement analysis for the following 77 genes: IP, ALK, APC*, ATM*, AXIN2, BAP1, BARD1, BLM, BMPR1A, BRCA1*, BRCA2*, BRIP1*, CDC73, CDH1*,CDK4, CDKN1B, CDKN2A, CHEK2*, CTNNA1, DICER1, FANCC, FH, FLCN, GALNT12, KIF1B, LZTR1, MAX, MEN1, MET, MLH1*, MSH2*, MSH3, MSH6*, MUTYH*, NBN, NF1*, NF2, NTHL1, PALB2*, PHOX2B, PMS2*, POT1, PRKAR1A, PTCH1, PTEN*, RAD51C*, RAD51D*,RB1, RECQL, RET, SDHA, SDHAF2, SDHB, SDHC, SDHD, SMAD4, SMARCA4, SMARCB1, SMARCE1, STK11, SUFU, TMEM127, TP53*,TSC1, TSC2, VHL and  XRCC2 (sequencing and deletion/duplication); EGFR, EGLN1, HOXB13, KIT, MITF, PDGFRA, POLD1 and POLE (sequencing only); EPCAM and GREM1 (deletion/duplication only).   07/09/2022 -  Chemotherapy   Patient is on Treatment Plan : BREAST ADO-Trastuzumab Emtansine  (Kadcyla ) q21d      HISTORY OF PRESENTING ILLNESS: Patient is ambulating independently.  With husband.    Marilyn Page 48 y.o.  female right breast TRIPLE POSITIVE with T4- Stage III -s/p mastectomy-with expanders-currently on adjuvant kadcyla  / tamoxifen  + OFS  is here for follow-up.   C/o of swelling in her right knee and b/l ankle x2 weeks and painful, 7/10. Worse with movements.  Improved with ice.  Mammogram 01/05/24  Patient complains of worsening joint pains.  Also achiness in the morning.  Also stiffness all over.  Complains of worsening hot flashes.  Difficulty sleeping at night..  Associated with anxiety.  Continues to have mild tightness of right chest wall. Started on PT with [facility]. Patient states she is non-currently compliant with therapy oral Potassium intermittently..  Mild  neuropathy.   Review of Systems  Constitutional:  Positive for malaise/fatigue. Negative for chills, diaphoresis, fever and weight loss.  HENT:  Negative for nosebleeds and sore throat.   Eyes:  Negative for double vision.  Respiratory:  Negative for cough, hemoptysis, sputum production, shortness of breath and wheezing.   Cardiovascular:  Negative for chest pain, palpitations, orthopnea and leg swelling.  Gastrointestinal:  Negative for abdominal pain, blood in stool, constipation, diarrhea, heartburn, melena, nausea and vomiting.  Genitourinary:  Negative for dysuria, frequency and urgency.  Musculoskeletal:  Positive for myalgias. Negative for back pain and joint pain.  Skin: Negative.  Negative for itching and rash.  Neurological:  Negative for dizziness, tingling, focal weakness, weakness and headaches.  Endo/Heme/Allergies:  Does not  bruise/bleed easily.  Psychiatric/Behavioral:  Negative for depression. The patient is not nervous/anxious and does not have insomnia.      MEDICAL HISTORY:  Past Medical History:  Diagnosis Date   Anemia    Carcinoma of upper-outer quadrant of right breast in female, estrogen receptor positive (HCC) 10/26/2021   Diabetes mellitus without complication (HCC)    Family history of adverse reaction to anesthesia    grandmother had issues but doesnt know what the issue was, she was older.   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 Axilla Butterfly hydro clip-MACRO METASTATIC MAMMARY CARCINOMA   BREAST BIOPSY Right 04/21/2022   US  RT PLC BREAST LOC DEV   1ST LESION  INC US  GUIDE 04/21/2022 [facility]   BREAST RECONSTRUCTION WITH PLACEMENT OF TISSUE EXPANDER AND FLEX HD (ACELLULAR HYDRATED DERMIS) Right 04/21/2022   Procedure: RIGHT BREAST RECONSTRUCTION WITH PLACEMENT OF TISSUE EXPANDER AND FLEX HD (ACELLULAR HYDRATED DERMIS);  Surgeon: Lowery Estefana RAMAN, DO;  Location: [facility];  Service: Plastics;  Laterality: Right;   CESAREAN SECTION  2005/2008   DILATION AND CURETTAGE OF UTERUS  2003   ESOPHAGOGASTRODUODENOSCOPY N/A 11/02/2023   Procedure: EGD (ESOPHAGOGASTRODUODENOSCOPY);  Surgeon: Toledo, Ladell POUR, MD;  Location: [facility];  Service: Gastroenterology;  Laterality: N/A;   MASTECTOMY Right 04/2023   PORTACATH PLACEMENT Left 11/09/2021   Procedure: INSERTION PORT-A-CATH;  Surgeon: Dessa Reyes ORN, MD;  Location: [facility];  Service: General;  Laterality: Left;   REMOVAL OF TISSUE EXPANDER AND PLACEMENT OF IMPLANT Right  06/08/2022   Procedure: REMOVAL OF TISSUE EXPANDER;  Surgeon: Lowery Estefana RAMAN, DO;  Location: [facility];  Service: Plastics;  Laterality: Right;   SIMPLE MASTECTOMY WITH AXILLARY SENTINEL NODE BIOPSY Right 04/21/2022   Procedure: SIMPLE MASTECTOMY WITH AXILLARY SENTINEL NODE BIOPSY;  Surgeon: Dessa Reyes ORN, MD;  Location: [facility];  Service: General;  Laterality: Right;  wire localization of lymph node   SKIN BIOPSY Right 11/09/2021   Procedure: PUNCH BIOPSY SKIN, TATTOO  LYMPH NODE RIGHT AXILLA;  Surgeon: Dessa Reyes ORN, MD;  Location: [facility];  Service: General;  Laterality: Right;   WISDOM TOOTH EXTRACTION      SOCIAL HISTORY: Social History   Socioeconomic History   Marital status: Married    Spouse name: Bonnie   Number of children: 2   Years of education: Not on file   Highest education level: Not on file  Occupational History   Occupation: and Architectural technologist  Tobacco Use   Smoking status: Never   Smokeless tobacco: Never  Vaping Use   Vaping status: Never Used  Substance and Sexual Activity   Alcohol use: Never   Drug use: Never   Sexual activity: Yes    Birth control/protection: Other-see comments, Surgical    Comment: vasectomy  Other Topics Concern   Not on file  Social History Narrative   Pre-school teacher; in [place]; 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    Social Drivers of Corporate investment banker Strain: Low Risk  (01/02/2024)   Received from [facility]   Overall Financial Resource Strain (CARDIA)    Difficulty of Paying Living Expenses: Not hard at all  Food Insecurity: No Food Insecurity (01/02/2024)   Received from [facility]   Hunger Vital Sign    Within the past 12 months, you worried that your food would run out before you got the money to buy more.: Never true    Within the past 12 months, the food you bought just didn't last and you didn't have money to get more.: Never true  Transportation Needs: No  Transportation Needs (01/02/2024)   Received from [facility] - Transportation    In the past 12 months, has lack of transportation kept you from medical appointments or from getting medications?: No    Lack of Transportation (Non-Medical): No  Physical Activity: Inactive (09/26/2017)   Exercise Vital Sign    Days of Exercise per Week: 0 days    Minutes of Exercise per Session: 0 min  Stress: Not on file  Social Connections: Not on file  Intimate Partner Violence: Not on file    FAMILY HISTORY: Family History  Problem Relation Age of Onset   Ovarian cysts Mother    Migraines Mother    Melanoma Mother        on leg   Hyperlipidemia Father    Diabetes Maternal Aunt    Stomach cancer Paternal Aunt    Diabetes Maternal Grandfather    Lymphoma Cousin 17   Breast cancer Neg Hx     ALLERGIES:  is allergic to metformin and oxycodone .  MEDICATIONS:  Current Outpatient Medications  Medication Sig Dispense Refill   acetaminophen  (TYLENOL ) 325 MG tablet Take 2 tablets (650 mg total) by mouth every 6 (six) hours as needed for mild pain (or Fever >/= 101).     ascorbic acid  (VITAMIN C) 500 MG tablet Take 1 tablet (500 mg total) by mouth daily. 30 tablet    calcium -vitamin D  (OSCAL WITH D) 500-5 MG-MCG tablet Take 1 tablet by mouth 2 (two) times daily. (Patient taking differently: Take 1 tablet by mouth daily with breakfast.) 60 tablet 2   Dapagliflozin Propanediol (FARXIGA PO) Take 10 mg by mouth daily.     lidocaine -prilocaine  (EMLA ) cream Apply on the port. 30 -45 min  prior to port access. 30 g 3   ondansetron  (ZOFRAN ) 4 MG tablet Take 1 tablet (4 mg total) by mouth every  8 (eight) hours as needed for up to 20 doses for nausea or vomiting. 20 tablet 0   ondansetron  (ZOFRAN -ODT) 8 MG disintegrating tablet Take 8 mg by mouth every 8 (eight) hours as needed for nausea or vomiting.     ONETOUCH VERIO test strip SMARTSIG:1 Each Via Meter Daily PRN     pioglitazone  (ACTOS) 15 MG tablet Take 15 mg by mouth daily.     potassium chloride  (KLOR-CON ) 20 MEQ packet TAKE 20 MEQ BY MOUTH 2 (TWO) TIMES DAILY. 180 packet 1   prochlorperazine  (COMPAZINE ) 10 MG tablet Take 1 tablet (10 mg total) by mouth every 6 (six) hours as needed for nausea or vomiting. 40 tablet 1   tamoxifen  (NOLVADEX ) 20 MG tablet TAKE 1 TABLET BY MOUTH EVERY DAY 90 tablet 1   No current facility-administered medications for this visit.   Facility-Administered Medications Ordered in Other Visits  Medication Dose Route Frequency Provider Last Rate Last Admin   sodium chloride  flush (NS) 0.9 % injection 10 mL  10 mL Intravenous PRN Jaila Schellhorn R, MD   10 mL at 08/17/23 1525      .  PHYSICAL EXAMINATION: ECOG PERFORMANCE STATUS: 0 - Asymptomatic  Vitals:   02/22/24 1410  BP: 122/75  Pulse: 69  Resp: 16  Temp: 97.6 F (36.4 C)  SpO2: 99%     Filed Weights   02/22/24 1410  Weight: 231 lb 9.6 oz (105.1 kg)     Physical Exam Vitals and nursing note reviewed.  HENT:     Head: Normocephalic and atraumatic.     Mouth/Throat:     Pharynx: Oropharynx is clear.  Eyes:     Extraocular Movements: Extraocular movements intact.     Pupils: Pupils are equal, round, and reactive to light.  Cardiovascular:     Rate and Rhythm: Normal rate and regular rhythm.  Pulmonary:     Comments: Decreased breath sounds bilaterally.  Abdominal:     Palpations: Abdomen is soft.  Musculoskeletal:        General: Normal range of motion.     Cervical back: Normal range of motion.  Skin:    General: Skin is warm.  Neurological:     General: No focal deficit present.     Mental Status: She is alert and oriented to person, place, and time.  Psychiatric:        Behavior: Behavior normal.        Judgment: Judgment normal.      LABORATORY DATA:  I have reviewed the data as listed Lab Results  Component Value Date   WBC 5.1 02/22/2024   HGB 12.3 02/22/2024   HCT 36.2 02/22/2024    MCV 85.2 02/22/2024   PLT 134 (L) 02/22/2024   Recent Labs    08/17/23 1519 11/18/23 1305 02/22/24 1412  NA 136 138 137  K 3.1* 3.3* 3.2*  CL 103 106 104  CO2 27 25 24   GLUCOSE 135* 163* 184*  BUN 14 13 13   CREATININE 0.69 0.58 0.73  CALCIUM  9.0 8.0* 9.0  GFRNONAA >60 >60 >60  PROT 6.8 6.3* 6.6  ALBUMIN 3.1* 2.9* 3.3*  AST 24 23 32  ALT 17 16 21   ALKPHOS 46 57 57  BILITOT 0.7 0.4 0.7    RADIOGRAPHIC STUDIES: I have personally reviewed the radiological images as listed and agreed with the findings in the report. No results found.   ASSESSMENT & PLAN:   Carcinoma of upper-outer quadrant of right breast in female,  estrogen receptor positive (HCC) # MAY 2023- STAGE III-T4N1 ER/PR positive HER2 POSITIVE RIGHT breast cancer; LN-positive-ER/PR positive her-2 NEGATIVE; # s/p neoadjuvant chemotherapy- TCH plus P x 6 cycles- s/p Right mastectomy-[15th, NOv 2023] ypT2 [19mm]; ypN0.  Given partial response/involvement of the breast skin/multifocal disease- s/p RT- 08/27/2022. Patient currently on tamoxifen  [DEC-JAN 2024]; + OFS- Lupron  every 3 months ]. S/p  Ado-trastuzumab emtansine  x 14 cycles.   # JUNE 2nd, 2025- Stable examination without evidence of new or progressive disease in the chest, abdomen or pelvis;  Stable prominent mediastinal, mesenteric and retroperitoneal lymph nodes; Stable scattered pulmonary nodules. Monitor for now- will plan imaging in 4-6 months.   # MSK- G-1-2; likely secondary to endocrine side effects.-CONTINUE Tamoxifen , continue Osteobiflex OTC. Recommend evaluation- with podiatry re: right heel pain/ achilles heel.    # CT MARCH 2025- incidental- Fatty liver infiltration with mild splenomegaly and varices/ rule out  chronic liver disease- - s/p KC- GI evaluation-  MAY 2025- EGD-negative.   # Mild Thrombocytopenia- >100  monitor for now. Stable.   # Hypocalcemia-  ON adjuvant Zometa  q 6 months x 3 years [first- May-17th, 2024].   Continue ca+Vit D BID. Vit  D FEB 2024- 54; [albumin 3.0]-  Stable.  # Right sided UE swelling/tightness- s/p  Maureen-  Stable.  # Infusion reaction - ZOmeta  [flu like symtoms]- imprived with NSAIDs-  Stable.   # Diabetes- BG 168 [post lunch]- on  Farxiga 10 mg- [OFF ozempic/metformin- intolerance]; on Farxiga-noted to have weight gain.  Defer to PCP.  # Hypoalbuminemia-2.9 unclear etiology.  Low concerns for malnutrition.  Check urine random protein creatinine ratio  #  Anxiety; hot flashes/ joint pains- s/p cerula care- improved/stable [on happy juice]  # Hypokalemia: continue Kdur BID- Stable.  # PV access: port functional- # port/IV access-  flush q 6- 8 weeks for now.   Lupron  05/13/2023  q 21M; Zometa  [over 30 mins] q 6 M- #1 10/22/22   # DISPOSITION: # urine test today # Lupron  today; NO Zometa  # port flush in 6 weeks # follow up in  3 months- MD;  port- labs; cbc/cmp; Lupron ; zometa  [over 30 mins] - -Dr.B    All questions were answered. The patient/family knows to call the clinic with any problems, questions or concerns.     Cindy JONELLE Joe, MD 02/22/2024 3:02 PM

## 2024-02-22 NOTE — Progress Notes (Unsigned)
 C/o swelling in her right knee and b/l ankle x2 weeks and painful, 7/10. Joints in general are hurting. At times feels like arms are swelling.  Mammogram 01/05/24.

## 2024-02-23 ENCOUNTER — Encounter: Payer: Self-pay | Admitting: Internal Medicine

## 2024-03-12 ENCOUNTER — Telehealth: Payer: Self-pay | Admitting: Internal Medicine

## 2024-03-12 NOTE — Telephone Encounter (Signed)
 I spoke to patient regarding-additional adjuvant therapies in the context of her high risk triple positive breast cancer.   Recommend follow-up- on 10/07-to regarding adjuvant neratinib at 1pm.  Please schedule on 10/07-  1pm appt. MD; no labs. NO need to call pt/ pt aware of the date and time of appt.   Keep other appts as planned    AL-FYI-

## 2024-03-13 ENCOUNTER — Encounter: Payer: Self-pay | Admitting: Internal Medicine

## 2024-03-13 ENCOUNTER — Inpatient Hospital Stay: Admitting: Pharmacist

## 2024-03-13 ENCOUNTER — Telehealth: Payer: Self-pay | Admitting: Pharmacy Technician

## 2024-03-13 ENCOUNTER — Inpatient Hospital Stay: Attending: Internal Medicine | Admitting: Internal Medicine

## 2024-03-13 ENCOUNTER — Telehealth: Payer: Self-pay | Admitting: Pharmacist

## 2024-03-13 ENCOUNTER — Other Ambulatory Visit (HOSPITAL_COMMUNITY): Payer: Self-pay

## 2024-03-13 VITALS — BP 127/61 | HR 88 | Temp 97.4°F | Resp 16 | Ht 66.0 in | Wt 231.3 lb

## 2024-03-13 DIAGNOSIS — C50411 Malignant neoplasm of upper-outer quadrant of right female breast: Secondary | ICD-10-CM | POA: Diagnosis not present

## 2024-03-13 DIAGNOSIS — Z79810 Long term (current) use of selective estrogen receptor modulators (SERMs): Secondary | ICD-10-CM | POA: Diagnosis not present

## 2024-03-13 DIAGNOSIS — E876 Hypokalemia: Secondary | ICD-10-CM | POA: Diagnosis not present

## 2024-03-13 DIAGNOSIS — E119 Type 2 diabetes mellitus without complications: Secondary | ICD-10-CM | POA: Diagnosis not present

## 2024-03-13 DIAGNOSIS — Z170 Estrogen receptor positive status [ER+]: Secondary | ICD-10-CM | POA: Insufficient documentation

## 2024-03-13 DIAGNOSIS — C50211 Malignant neoplasm of upper-inner quadrant of right female breast: Secondary | ICD-10-CM | POA: Insufficient documentation

## 2024-03-13 DIAGNOSIS — D696 Thrombocytopenia, unspecified: Secondary | ICD-10-CM | POA: Diagnosis not present

## 2024-03-13 DIAGNOSIS — M25561 Pain in right knee: Secondary | ICD-10-CM | POA: Insufficient documentation

## 2024-03-13 MED ORDER — LOPERAMIDE HCL 2 MG PO CAPS: 4.0000 mg | ORAL_CAPSULE | Freq: Four times a day (QID) | ORAL | 1 refills | Status: AC | PRN

## 2024-03-13 MED ORDER — NERATINIB MALEATE 40 MG PO TABS
ORAL_TABLET | ORAL | 0 refills | Status: DC
Start: 2024-03-13 — End: 2024-04-06

## 2024-03-13 NOTE — Progress Notes (Signed)
 C/o joints ache.

## 2024-03-13 NOTE — Telephone Encounter (Signed)
 Patient is aware her prescription was sent out to [facility]. I asked her to reach out to [facility] on 10/9 if she has not heard from them. The plan is for her to start therapy on 03/19/24

## 2024-03-13 NOTE — Progress Notes (Signed)
 Clinical Pharmacist Practitioner Clinic [facility]  [phone] [phone]  Patient Care Team: [name], NP as PCP - General (Family Medicine) [name], RN as Oncology Nurse Navigator [name], [name]   Name of the patient: Marilyn Page  [id number]  [birth year]   Date of visit: 03/13/24   HPI: Patient is a 48 y.o. 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 Planned adjuvant treatment with neratinib.   Reason for Consult: Neratinib oral chemotherapy education.   PAST MEDICAL HISTORY: Past Medical History:  Diagnosis Date   Anemia    Carcinoma of upper-outer quadrant of right breast in female, estrogen receptor positive (HCC) 10/26/2021   Diabetes mellitus without complication (HCC)    Family history of adverse reaction to anesthesia    grandmother had issues but doesnt know what the issue was, she was older.   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   There is an abnormal 8 mm thickened right axillary lymph node.   There is skin thickening overlying the right breast. ------------------  A. BREAST, RIGHT AT 10:00, 6 CM FROM THE NIPPLE; ULTRASOUND-GUIDED CORE  NEEDLE BIOPSY:  - INVASIVE MAMMARY CARCINOMA, NO SPECIAL TYPE.   Size of invasive carcinoma: 11 mm in this sample  Histologic grade of invasive carcinoma: Grade 2                       Glandular/tubular differentiation score: 3                       Nuclear pleomorphism score: 2                        Mitotic rate score: 1                       Total score: 6  Ductal carcinoma in situ: Present, intermediate grade  Lymphovascular invasion: Not identified   B. LYMPH NODE, RIGHT AXILLARY; ULTRASOUND-GUIDED CORE NEEDLE BIOPSY:  - 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 IN GREATEST  EXTENT.   Comment:  The malignancy in the primary breast lesion appears morphologically  different from tumor in the lymph node sampling, with tumor present in  lymph node more suggestive of a histologic grade 3, with significantly  increased mitotic activity and pleomorphism. Due to the discrepancy in  these morphologic patterns, ER/PR/HER2 immunohistochemistry will be  performed on both blocks A1 and B1, with reflex to FISH for HER2 2+. The  results will be reported in an addendum.   ADDENDUM:  CASE SUMMARY: BREAST BIOMARKER TESTS - A - BREAST, RIGHT AT 10:00  Estrogen Receptor (ER) Status: POSITIVE          Percentage of cells with nuclear positivity: 71-80%          Average intensity of staining: Strong   Progesterone Receptor (PgR) Status: POSITIVE  Percentage of cells with nuclear positivity: 81-90%          Average intensity of staining: Strong   HER2 (by immunohistochemistry): POSITIVE (Score 3+)       Percentage of cells with uniform intense complete membrane  staining: 61-70%  HER2 displays a heterogeneous staining pattern, with areas showing a  strong 3+ staining pattern, and other regions with complete absence (0+)  of staining.   Ki-67: Not performed   CASE SUMMARY: BREAST BIOMARKER TESTS - B - RIGHT AXILLARY LYMPH NODE  Estrogen Receptor (ER) Status: POSITIVE          Percentage of cells with nuclear positivity: 81-90%          Average intensity of staining: Strong   Progesterone Receptor (PgR) Status: POSITIVE          Percentage of cells with nuclear positivity: 51-60%          Average intensity of staining: Strong   HER2 (by  immunohistochemistry): NEGATIVE (Score 1+)  Ki-67: Not performed   #  MAY 2023- STAGE III-T4N1 ER/PR positive HER2 POSITIVE right breast cancer;LN-positive-ER/PR positive however 2 NEGATIVE s/p biopsy [see discussion below].  Discussed with Dr. Mabeline tumor heterogeneity. BREAST MRI- JUNE 2023- The patient's known malignancy in the upper outer quadrant of the right breast measures 3.8 x 2.8 x 3.6 cm. Numerous other suspicious masses are identified in the right breast as above involving the upper outer and upper inner quadrants, located both anterior and posterior to the known malignancy.  Numerous enhancing foci in the skin as above are worrisome for skin metastases ; Bx proven. Biopsy proven metastatic node in the right axilla.  No evidence of malignancy in the left breast;  Two mildly prominent right internal mammary nodes were not FDG avid on recent PET-CT imaging.  Currently on neoadjuvant chemotherapy- TCH plus P x6 cycles;  MUGA scan May 31st, 2023- -61%.    # June 7th, 2023- NEO-ADJUVANT CHEMO- TCH+P x6 cycles- s/p Mastectomy [NOV 15th, 2023] [Dr.Byrnett]-  ypT2 [29mm]; ypN0.  # JAN mid, 2024 -TAMOXFEN 20 mg a day; ADD LupronFEB 2nd.2 2024 s/p RT maech, 22nd- 2024  # FEB 2nd, 2023- Kadcyla  q 3 W x14 cycles- finished dec 2024.   # Lupron  q3 M [/start 2/07-2022]   # Genetic counseling s/p- NEG for any deleterious mutations.     Carcinoma of upper-outer quadrant of right breast in female, estrogen receptor positive (HCC)  10/26/2021 Initial Diagnosis   Carcinoma of upper-outer quadrant of right breast in female, estrogen receptor positive (HCC)   10/26/2021 Cancer Staging   Staging form: Breast, AJCC 8th Edition - Clinical: Stage IIIA (cT4d, cN1, cM0, G2, ER+, PR+, HER2+) - Signed by [name], MD on 06/04/2022 Histologic grading system: 3 grade system   11/10/2021 - 03/01/2022 Chemotherapy   Patient is on Treatment Plan : BREAST  Docetaxel  + Carboplatin  + Trastuzumab  +  Pertuzumab   (TCHP) q21d      11/11/2021 - 01/13/2022 Chemotherapy   Patient is on Treatment Plan : BREAST  Docetaxel  + Carboplatin  + Trastuzumab  + Pertuzumab   (TCHP) q21d       Genetic Testing   Negative genetic testing. No pathogenic variants identified on the [facility] CancerNext-Expanded+RNA panel. The report date is 11/29/2021.  The CancerNext-Expanded + RNAinsight gene panel offered by [employer] and includes sequencing and rearrangement analysis for the following 77 genes: IP, ALK, APC*, ATM*, AXIN2, BAP1, BARD1, BLM, BMPR1A, BRCA1*, BRCA2*, BRIP1*, CDC73, CDH1*,CDK4, CDKN1B, CDKN2A, CHEK2*, CTNNA1,  DICER1, FANCC, FH, FLCN, GALNT12, KIF1B, LZTR1, MAX, MEN1, MET, MLH1*, MSH2*, MSH3, MSH6*, MUTYH*, NBN, NF1*, NF2, NTHL1, PALB2*, PHOX2B, PMS2*, POT1, PRKAR1A, PTCH1, PTEN*, RAD51C*, RAD51D*,RB1, RECQL, RET, SDHA, SDHAF2, SDHB, SDHC, SDHD, SMAD4, SMARCA4, SMARCB1, SMARCE1, STK11, SUFU, TMEM127, TP53*,TSC1, TSC2, VHL and XRCC2 (sequencing and deletion/duplication); EGFR, EGLN1, HOXB13, KIT, MITF, PDGFRA, POLD1 and POLE (sequencing only); EPCAM and GREM1 (deletion/duplication only).   07/09/2022 -  Chemotherapy   Patient is on Treatment Plan : BREAST ADO-Trastuzumab Emtansine  (Kadcyla ) q21d       ALLERGIES:  is allergic to metformin and oxycodone .  MEDICATIONS:  Current Outpatient Medications  Medication Sig Dispense Refill   acetaminophen  (TYLENOL ) 325 MG tablet Take 2 tablets (650 mg total) by mouth every 6 (six) hours as needed for mild pain (or Fever >/= 101).     ascorbic acid  (VITAMIN C) 500 MG tablet Take 1 tablet (500 mg total) by mouth daily. 30 tablet    calcium -vitamin D  (OSCAL WITH D) 500-5 MG-MCG tablet Take 1 tablet by mouth 2 (two) times daily. (Patient taking differently: Take 1 tablet by mouth daily with breakfast.) 60 tablet 2   Dapagliflozin Propanediol (FARXIGA PO) Take 10 mg by mouth daily.     lidocaine -prilocaine  (EMLA ) cream Apply on the port. 30 -45 min  prior to port access.  30 g 3   ondansetron  (ZOFRAN ) 4 MG tablet Take 1 tablet (4 mg total) by mouth every 8 (eight) hours as needed for up to 20 doses for nausea or vomiting. 20 tablet 0   ondansetron  (ZOFRAN -ODT) 8 MG disintegrating tablet Take 8 mg by mouth every 8 (eight) hours as needed for nausea or vomiting.     ONETOUCH VERIO test strip SMARTSIG:1 Each Via Meter Daily PRN     pioglitazone (ACTOS) 15 MG tablet Take 15 mg by mouth daily.     potassium chloride  (KLOR-CON ) 20 MEQ packet TAKE 20 MEQ BY MOUTH 2 (TWO) TIMES DAILY. 180 packet 1   prochlorperazine  (COMPAZINE ) 10 MG tablet Take 1 tablet (10 mg total) by mouth every 6 (six) hours as needed for nausea or vomiting. 40 tablet 1   tamoxifen  (NOLVADEX ) 20 MG tablet TAKE 1 TABLET BY MOUTH EVERY DAY 90 tablet 1   No current facility-administered medications for this visit.   Facility-Administered Medications Ordered in Other Visits  Medication Dose Route Frequency Provider Last Rate Last Admin   sodium chloride  flush (NS) 0.9 % injection 10 mL  10 mL Intravenous PRN Brahmanday, Govinda R, MD   10 mL at 08/17/23 1525    VITAL SIGNS: LMP  (LMP Unknown) Comment: Urine Pregnanacy Negative There were no vitals filed for this visit.  Estimated body mass index is 37.33 kg/m as calculated from the following:   Height as of an earlier encounter on 03/13/24: 5' 6 (1.676 m).   Weight as of an earlier encounter on 03/13/24: 104.9 kg (231 lb 4.8 oz).  LABS: CBC:    Component Value Date/Time   WBC 5.1 02/22/2024 1412   WBC 3.6 (L) 04/08/2023 0831   HGB 12.3 02/22/2024 1412   HGB 7.8 (L) 04/23/2021 1431   HCT 36.2 02/22/2024 1412   HCT 25.5 (L) 04/23/2021 1431   PLT 134 (L) 02/22/2024 1412   PLT 384 04/23/2021 1431   MCV 85.2 02/22/2024 1412   MCV 84 04/23/2021 1431   NEUTROABS 3.4 02/22/2024 1412   NEUTROABS 5.4 04/23/2021 1431   LYMPHSABS 1.3 02/22/2024 1412   LYMPHSABS 2.2 04/23/2021 1431   MONOABS 0.3  02/22/2024 1412   EOSABS 0.1 02/22/2024 1412    EOSABS 0.2 04/23/2021 1431   BASOSABS 0.0 02/22/2024 1412   BASOSABS 0.1 04/23/2021 1431   Comprehensive Metabolic Panel:    Component Value Date/Time   NA 137 02/22/2024 1412   NA 143 09/26/2017 1512   K 3.2 (L) 02/22/2024 1412   CL 104 02/22/2024 1412   CO2 24 02/22/2024 1412   BUN 13 02/22/2024 1412   BUN 9 09/26/2017 1512   CREATININE 0.73 02/22/2024 1412   GLUCOSE 184 (H) 02/22/2024 1412   CALCIUM  9.0 02/22/2024 1412   AST 32 02/22/2024 1412   ALT 21 02/22/2024 1412   ALKPHOS 57 02/22/2024 1412   BILITOT 0.7 02/22/2024 1412   PROT 6.6 02/22/2024 1412   PROT 6.8 09/26/2017 1512   ALBUMIN 3.3 (L) 02/22/2024 1412   ALBUMIN 3.9 09/26/2017 1512     Present during today's visit: patient and husband via [employer] plan: patient will start as soon as she has medication in hand   Patient Education I spoke with patient for overview of new oral chemotherapy medication: neratinib   Treatment goal: Curative  Administration: Counseled patient on administration, dosing, side effects, monitoring, drug-food interactions, safe handling, storage, and disposal. Patient will take 3 tablets (120mg ) by mouth for 2 weeks, then 4 tablets (160) for 2 weeks, then 6 tablets (240mg ) thereafter. Take with food.   Side Effects: Side effects include but not limited to: diarrhea, nausea, abdominal pain. Diarrhea: patient knows to use loperamide  as needed and call the office if they are having four or more loose stool per day. Discussed diarrhea management at length. Prescription for loperamide  sent to her local pharmacy.     Drug-drug Interactions (DDI): Patient's calcium +vit D is calcium  carbonate. She knows to Separate the administration of neratinib and calcium  by giving neratinib at least 3 hours after the antacid. She will work for her schedule because she taking her calcium  in the morning and plans on taking her neratinib in the evening.   Adherence: After discussion with patient no  patient barriers to medication adherence identified.  Reviewed with patient importance of keeping a medication schedule and plan for any missed doses.  Distress evaluation: Distress thermometer not completed during in person visit as patient has been on previous lines of therapy.    Communication and Learning Assessment Primary learner: patient and her husband Barriers to learning: No barriers Preferred language: English Learning preferences: Listening Reading  Ms. Gelardi voiced understanding and appreciation. All questions answered. Medication handout provided.  Provided patient with Oral Chemotherapy Navigation Clinic phone number. Patient knows to call the office with questions or concerns. Oral Chemotherapy Navigation Clinic will continue to follow.  Patient expressed understanding and was in agreement with this plan. She also understands that She can call clinic at any time with any questions, concerns, or complaints.   Medication Access Issues: Pending fill, patient required to fill at [facility]  Follow-up plan: RTC as scheduled  Thank you for allowing me to participate in the care of this patient.   Time Total: 20 mins  Visit consisted of counseling and education on dealing with issues of symptom management in the setting of serious and potentially life-threatening illness.Greater than 50%  of this time was spent counseling and coordinating care related to the above assessment and plan.  Signed by: Shiara Mcgough N. Ricardo Kayes, PharmD, NEILA, CPP Hematology/Oncology Clinical Pharmacist Practitioner [place] [phone]  03/13/2024 1:55 PM

## 2024-03-13 NOTE — Telephone Encounter (Signed)
 Clinical Pharmacist Practitioner Encounter   Received new prescription for Nerlynx (neratinib) for the adjuvant treatment of high risk triple positive breast cancer, planned duration of 1 year.  CMP from 02/22/24 assessed, no relevant lab abnormalities. Prescription dose and frequency assessed.   Current medication list in Epic reviewed, one potential DDIs with neratinib identified: Calcium : if the calcium  in her calcium -vit D product is calcium  carbonate, the calcium  carbonate may decrease serum concentrations of Neratinib. Separate the administration of neratinib and calcium  by giving neratinib at least 3 hours after the antacid. Not an issue if calcium  citrate   Evaluated chart and no patient barriers to medication adherence identified.   Prescription has been e-scribed to the [facility] for benefits analysis and approval.  Oral Oncology Clinic will continue to follow for insurance authorization, copayment issues, initial counseling and start date.   Nandi Tonnesen N. Lili Harts, PharmD, BCOP, CPP Hematology/Oncology Clinical Pharmacist [facility] Oral Chemotherapy Navigation Clinic [phone]  03/13/2024 9:25 AM

## 2024-03-13 NOTE — Assessment & Plan Note (Addendum)
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  Given partial response/involvement of the breast skin/multifocal disease-postmastectomy s/p RT- 08/27/2022. Patient currently on tamoxifen  [DEC-JAN 2024]; + OFS- Lupron  every 3 months ]. S/p  Ado-trastuzumab emtansine  x 14 cycles.   # JUNE 2nd, 2025- Stable examination without evidence of new or progressive disease in the chest, abdomen or pelvis;  Stable prominent mediastinal, mesenteric and retroperitoneal lymph nodes; Stable scattered pulmonary nodules. Monitor for now- will plan imaging in 4-6 months.   # I discussed with the patient regarding-given her high risk disease at baseline and also given the lack of complete pathologic response status post neoadjuvant chemotherapy her overall risk of recurrence is about 20 to 30%.  Given the absence of any prospective clinical trials of the use of neratinib in absence post TDM 1 therapy-I think it still reasonable to consider neratinib adjuvant for total of 1 year especially as she is ER/PR positive.  # Discussed the potential risk of diarrhea, and skin rash and other GI side effects with neratinib.  However discussed the use of stepup approach would recommend every 2 weeks rather than every week step up.  Discussed with Isaiah from pharmacy.  Patient could potentially get started for step up dosing on 13 November.   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 # [facility] re: right knee pain   # CT MARCH 2025- incidental- Fatty liver infiltration with mild splenomegaly and varices/ rule out  chronic liver disease- - s/p KC- GI evaluation-  MAY 2025- EGD-negative/mild gastritis-ultrasound suggestive of fatty liver- stable.   # Mild Thrombocytopenia- >100  monitor for now. Stable.   # Hypocalcemia-  ON adjuvant Zometa  q  6 months x 3 years [first- May-17th, 2024].   Continue ca+Vit D BID. Vit D FEB 2024- 54; [albumin 3.0]-  Stable.  # Right sided UE swelling/tightness- s/p  Maureen-  Stable.  # Infusion reaction - ZOmeta  [flu like symtoms]- imprived with NSAIDs-   Stable.  # Diabetes- BG 168 [post lunch]- on  Farxiga 10 mg- [OFF ozempic/metformin- intolerance]; on Farxiga-noted to have weight gain.  Defer to PCP-   # Hypokalemia: continue Kdur powder daily-  stable.  # PV access: port functional- # port/IV access-  flush q 6- 8 weeks for now.   Lupron  05/13/2023  q 74M; Zometa  [over 30 mins] q 6 M- #1 10/22/22   # DISPOSITION: # follow up with APP in 3rd week of OCT 27th -APP- labs- cbc/cmp; mag-  # follow up with MD- NOV 17th week-  labs- cbc/cmp; mag-  # keep other appts as planned-  - Dr.B

## 2024-03-13 NOTE — Progress Notes (Signed)
 [facility] CONSULT NOTE  Patient Care Team: [name], NP as PCP - General (Family Medicine) [name], RN as Oncology Nurse Navigator [name], [name]  CHIEF COMPLAINTS/PURPOSE OF CONSULTATION: Breast cancer  Oncology History Overview Note  MAY 2023-    Targeted ultrasound is performed, showing a 2.9 x 1.9 x 2.0 cm irregular hypoechoic mass right breast 10 o'clock position 6 cm from nipple at the site of palpable concern.   There is an abnormal 8 mm thickened right axillary lymph node.   There is skin thickening overlying the right breast. ------------------  A. BREAST, RIGHT AT 10:00, 6 CM FROM THE NIPPLE; ULTRASOUND-GUIDED CORE  NEEDLE BIOPSY:  - INVASIVE MAMMARY CARCINOMA, NO SPECIAL TYPE.   Size of invasive carcinoma: 11 mm in this sample  Histologic grade of invasive carcinoma: Grade 2                       Glandular/tubular differentiation score: 3                       Nuclear pleomorphism score: 2                       Mitotic rate score: 1                       Total score: 6  Ductal carcinoma in situ: Present, intermediate grade  Lymphovascular invasion: Not identified   B. LYMPH NODE, RIGHT AXILLARY; ULTRASOUND-GUIDED CORE NEEDLE BIOPSY:  - 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 IN GREATEST  EXTENT.   Comment:  The malignancy in the primary breast lesion appears morphologically  different from tumor in the lymph node sampling, with tumor present in  lymph node more suggestive of a histologic grade 3, with significantly  increased mitotic activity and pleomorphism. Due to the discrepancy in  these morphologic patterns, ER/PR/HER2 immunohistochemistry will be  performed on both blocks A1 and B1, with reflex to FISH for HER2 2+. The  results will be reported in an addendum.   ADDENDUM:  CASE SUMMARY: BREAST BIOMARKER TESTS - A - BREAST, RIGHT AT 10:00  Estrogen Receptor  (ER) Status: POSITIVE          Percentage of cells with nuclear positivity: 71-80%          Average intensity of staining: Strong   Progesterone Receptor (PgR) Status: POSITIVE          Percentage of cells with nuclear positivity: 81-90%          Average intensity of staining: Strong   HER2 (by immunohistochemistry): POSITIVE (Score 3+)       Percentage of cells with uniform intense complete membrane  staining: 61-70%  HER2 displays a heterogeneous staining pattern, with areas showing a  strong 3+ staining pattern, and other regions with complete absence (0+)  of staining.   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  Discussed with Dr. Mabeline tumor heterogeneity. BREAST MRI- JUNE 2023- The patient's known malignancy in the upper outer quadrant of the right breast measures 3.8 x 2.8 x 3.6 cm. Numerous other suspicious masses are identified in the right breast as above involving the upper outer and upper inner quadrants, located both anterior and posterior to the known malignancy.  Numerous enhancing foci in the skin as above are worrisome for skin metastases ; Bx proven. Biopsy proven metastatic node in the right axilla.  No evidence of malignancy in the left breast;  Two mildly prominent right internal mammary nodes were not FDG avid on recent PET-CT imaging.  Currently on neoadjuvant chemotherapy- TCH  plus P x6 cycles;  MUGA scan May 31st, 2023- -61%.    # June 7th, 2023- NEO-ADJUVANT CHEMO- TCH+P x6 cycles- s/p Mastectomy [NOV 15th, 2023] [Dr.Byrnett]-  ypT2 [17mm]; ypN0.  # JAN mid, 2024 -TAMOXFEN 20 mg a day; ADD LupronFEB 2nd.2 2024 s/p RT maech, 22nd- 2024  # FEB 2nd, 2023- Kadcyla  q 3 W x14 cycles- finished dec 2024.   # OCT 17th, 2025- start NERATINIB step up dose.   # Lupron  q3 M [/start 2/07-2022]   # Genetic counseling s/p- NEG for any deleterious mutations.     Carcinoma of upper-outer quadrant of right breast in female, estrogen receptor positive (HCC)  10/26/2021 Initial Diagnosis   Carcinoma of upper-outer quadrant of right breast in female, estrogen receptor positive (HCC)   10/26/2021 Cancer Staging   Staging form: Breast, AJCC 8th Edition - Clinical: Stage IIIA (cT4d, cN1, cM0, G2, ER+, PR+, HER2+) - Signed by [name], MD on 06/04/2022 Histologic grading system: 3 grade system   11/10/2021 - 03/01/2022 Chemotherapy   Patient is on Treatment Plan : BREAST  Docetaxel  + Carboplatin  + Trastuzumab  + Pertuzumab   (TCHP) q21d      11/11/2021 - 01/13/2022 Chemotherapy   Patient is on Treatment Plan : BREAST  Docetaxel  + Carboplatin  + Trastuzumab  + Pertuzumab   (TCHP) q21d       Genetic Testing   Negative genetic testing. No pathogenic variants identified on the [facility] CancerNext-Expanded+RNA panel. The report date is 11/29/2021.  The CancerNext-Expanded + RNAinsight gene panel offered by [employer] and includes sequencing and rearrangement analysis for the following 77 genes: IP, ALK, APC*, ATM*, AXIN2, BAP1, BARD1, BLM, BMPR1A, BRCA1*, BRCA2*, BRIP1*, CDC73, CDH1*,CDK4, CDKN1B, CDKN2A, CHEK2*, CTNNA1, DICER1, FANCC, FH, FLCN, GALNT12, KIF1B, LZTR1, MAX, MEN1, MET, MLH1*, MSH2*, MSH3, MSH6*, MUTYH*, NBN, NF1*, NF2, NTHL1, PALB2*, PHOX2B, PMS2*, POT1, PRKAR1A, PTCH1, PTEN*, RAD51C*, RAD51D*,RB1, RECQL, RET, SDHA, SDHAF2, SDHB, SDHC, SDHD, SMAD4, SMARCA4, SMARCB1,  SMARCE1, STK11, SUFU, TMEM127, TP53*,TSC1, TSC2, VHL and XRCC2 (sequencing and deletion/duplication); EGFR, EGLN1, HOXB13, KIT, MITF, PDGFRA, POLD1 and POLE (sequencing only); EPCAM and GREM1 (deletion/duplication only).   07/09/2022 -  Chemotherapy   Patient is on Treatment Plan : BREAST ADO-Trastuzumab Emtansine  (Kadcyla ) q21d      HISTORY OF PRESENTING ILLNESS: Patient is ambulating independently.  Husband over the phone.  Marilyn Page 48 y.o.  female right breast TRIPLE POSITIVE with T4- Stage III -s/p mastectomy-currently on tamoxifen  + OFS  is here for follow-up.     Complains of ongoing hot flashes.  Also complains of overall weight gain.  Continues  to complain of ongoing joint pains.  Otherwise denies any tingling or numbness.  Denies any nausea vomiting headache.   Review of Systems  Constitutional:  Positive for malaise/fatigue. Negative for chills, diaphoresis, fever and weight loss.  HENT:  Negative for nosebleeds and sore throat.   Eyes:  Negative for double vision.  Respiratory:  Negative for cough, hemoptysis, sputum production, shortness of breath and wheezing.   Cardiovascular:  Negative for chest pain, palpitations, orthopnea and leg swelling.  Gastrointestinal:  Negative for abdominal pain, blood in stool, constipation, diarrhea, heartburn, melena, nausea and vomiting.  Genitourinary:  Negative for dysuria, frequency and urgency.  Musculoskeletal:  Positive for joint pain and myalgias. Negative for back pain.  Skin: Negative.  Negative for itching and rash.  Neurological:  Negative for dizziness, tingling, focal weakness, weakness and headaches.  Endo/Heme/Allergies:  Does not bruise/bleed easily.  Psychiatric/Behavioral:  Negative for depression. The patient is not nervous/anxious and does not have insomnia.      MEDICAL HISTORY:  Past Medical History:  Diagnosis Date   Anemia    Carcinoma of upper-outer quadrant of right breast in female, estrogen receptor  positive (HCC) 10/26/2021   Diabetes mellitus without complication (HCC)    Family history of adverse reaction to anesthesia    grandmother had issues but doesnt know what the issue was, she was older.   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 Axilla Butterfly hydro clip-MACRO METASTATIC MAMMARY CARCINOMA   BREAST BIOPSY Right 04/21/2022   US  RT PLC BREAST LOC DEV   1ST LESION  INC US  GUIDE 04/21/2022 [facility]   BREAST RECONSTRUCTION WITH PLACEMENT OF TISSUE EXPANDER AND FLEX HD (ACELLULAR HYDRATED DERMIS) Right 04/21/2022   Procedure: RIGHT BREAST RECONSTRUCTION WITH PLACEMENT OF TISSUE EXPANDER AND FLEX HD (ACELLULAR HYDRATED DERMIS);  Surgeon: Lowery Estefana RAMAN, DO;  Location: [facility] ORS;  Service: Plastics;  Laterality: Right;   CESAREAN SECTION  2005/2008   DILATION AND CURETTAGE OF UTERUS  2003   ESOPHAGOGASTRODUODENOSCOPY N/A 11/02/2023   Procedure: EGD (ESOPHAGOGASTRODUODENOSCOPY);  Surgeon: Toledo, Ladell POUR, MD;  Location: [facility]OS[facility];  Service: Gastroenterology;  Laterality: N/A;   MASTECTOMY Right 04/2023   PORTACATH PLACEMENT Left 11/09/2021   Procedure: INSERTION PORT-A-CATH;  Surgeon: Dessa Reyes ORN, MD;  Location: [facility] ORS;  Service: General;  Laterality: Left;   REMOVAL OF TISSUE EXPANDER AND PLACEMENT OF IMPLANT Right 06/08/2022   Procedure: REMOVAL OF TISSUE EXPANDER;  Surgeon: Lowery Estefana RAMAN, DO;  Location: [facility];  Service: Plastics;  Laterality: Right;   SIMPLE MASTECTOMY WITH AXILLARY SENTINEL NODE BIOPSY Right 04/21/2022   Procedure: SIMPLE MASTECTOMY WITH AXILLARY SENTINEL NODE BIOPSY;   Surgeon: Dessa Reyes ORN, MD;  Location: [facility] ORS;  Service: General;  Laterality: Right;  wire localization of lymph node   SKIN BIOPSY Right 11/09/2021   Procedure: PUNCH BIOPSY SKIN, TATTOO LYMPH NODE RIGHT AXILLA;  Surgeon: Dessa Reyes ORN, MD;  Location: [facility] ORS;  Service: General;  Laterality: Right;   WISDOM TOOTH EXTRACTION      SOCIAL HISTORY: Social History   Socioeconomic History   Marital status: Married  Spouse name: Bonnie   Number of children: 2   Years of education: Not on file   Highest education level: Not on file  Occupational History   Occupation: and Architectural technologist  Tobacco Use   Smoking status: Never   Smokeless tobacco: Never  Vaping Use   Vaping status: Never Used  Substance and Sexual Activity   Alcohol use: Never   Drug use: Never   Sexual activity: Yes    Birth control/protection: Other-see comments, Surgical    Comment: vasectomy  Other Topics Concern   Not on file  Social History Narrative   Pre-school teacher; in [place]; 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    Social Drivers of Corporate investment banker Strain: Low Risk  (01/02/2024)   Received from [facility]   Overall Financial Resource Strain (CARDIA)    Difficulty of Paying Living Expenses: Not hard at all  Food Insecurity: No Food Insecurity (01/02/2024)   Received from [facility]   Hunger Vital Sign    Within the past 12 months, you worried that your food would run out before you got the money to buy more.: Never true    Within the past 12 months, the food you bought just didn't last and you didn't have money to get more.: Never true  Transportation Needs: No Transportation Needs (01/02/2024)   Received from [facility] - Transportation    In the past 12 months, has lack of transportation kept you from medical appointments or from getting medications?: No    Lack of Transportation (Non-Medical): No  Physical Activity:  Inactive (09/26/2017)   Exercise Vital Sign    Days of Exercise per Week: 0 days    Minutes of Exercise per Session: 0 min  Stress: Not on file  Social Connections: Not on file  Intimate Partner Violence: Not on file    FAMILY HISTORY: Family History  Problem Relation Age of Onset   Ovarian cysts Mother    Migraines Mother    Melanoma Mother        on leg   Hyperlipidemia Father    Diabetes Maternal Aunt    Stomach cancer Paternal Aunt    Diabetes Maternal Grandfather    Lymphoma Cousin 17   Breast cancer Neg Hx     ALLERGIES:  is allergic to metformin and oxycodone .  MEDICATIONS:  Current Outpatient Medications  Medication Sig Dispense Refill   acetaminophen  (TYLENOL ) 325 MG tablet Take 2 tablets (650 mg total) by mouth every 6 (six) hours as needed for mild pain (or Fever >/= 101).     ascorbic acid  (VITAMIN C) 500 MG tablet Take 1 tablet (500 mg total) by mouth daily. 30 tablet    calcium -vitamin D  (OSCAL WITH D) 500-5 MG-MCG tablet Take 1 tablet by mouth 2 (two) times daily. (Patient taking differently: Take 1 tablet by mouth daily with breakfast.) 60 tablet 2   Dapagliflozin Propanediol (FARXIGA PO) Take 10 mg by mouth daily.     lidocaine -prilocaine  (EMLA ) cream Apply on the port. 30 -45 min  prior to port access. 30 g 3   ondansetron  (ZOFRAN ) 4 MG tablet Take 1 tablet (4 mg total) by mouth every 8 (eight) hours as needed for up to 20 doses for nausea or vomiting. 20 tablet 0   ondansetron  (ZOFRAN -ODT) 8 MG disintegrating tablet Take 8 mg by mouth every 8 (eight) hours as needed for nausea or vomiting.     ONETOUCH VERIO  test strip SMARTSIG:1 Each Via Meter Daily PRN     pioglitazone (ACTOS) 15 MG tablet Take 15 mg by mouth daily.     potassium chloride  (KLOR-CON ) 20 MEQ packet TAKE 20 MEQ BY MOUTH 2 (TWO) TIMES DAILY. 180 packet 1   prochlorperazine  (COMPAZINE ) 10 MG tablet Take 1 tablet (10 mg total) by mouth every 6 (six) hours as needed for nausea or vomiting. 40  tablet 1   tamoxifen  (NOLVADEX ) 20 MG tablet TAKE 1 TABLET BY MOUTH EVERY DAY 90 tablet 1   loperamide  (IMODIUM ) 2 MG capsule Take 2 capsules (4 mg total) by mouth 4 (four) times daily as needed for diarrhea or loose stools. (MAX 16 mg/day) 240 capsule 1   Neratinib Maleate (NERLYNX) 40 MG tablet Take 3 tablets (120mg ) by mouth for 2 weeks, then 4 tablets (160) for 2 weeks, then 6 tablets (240mg ) thereafter. Take with food. 110 tablet 0   No current facility-administered medications for this visit.   Facility-Administered Medications Ordered in Other Visits  Medication Dose Route Frequency Provider Last Rate Last Admin   sodium chloride  flush (NS) 0.9 % injection 10 mL  10 mL Intravenous PRN Annmargaret Decaprio R, MD   10 mL at 08/17/23 1525      .  PHYSICAL EXAMINATION: ECOG PERFORMANCE STATUS: 0 - Asymptomatic  Vitals:   03/13/24 1254  BP: 127/61  Pulse: 88  Resp: 16  Temp: (!) 97.4 F (36.3 C)  SpO2: 100%     Filed Weights   03/13/24 1254  Weight: 231 lb 4.8 oz (104.9 kg)     Physical Exam Vitals and nursing note reviewed.  HENT:     Head: Normocephalic and atraumatic.     Mouth/Throat:     Pharynx: Oropharynx is clear.  Eyes:     Extraocular Movements: Extraocular movements intact.     Pupils: Pupils are equal, round, and reactive to light.  Cardiovascular:     Rate and Rhythm: Normal rate and regular rhythm.  Pulmonary:     Comments: Decreased breath sounds bilaterally.  Abdominal:     Palpations: Abdomen is soft.  Musculoskeletal:        General: Normal range of motion.     Cervical back: Normal range of motion.  Skin:    General: Skin is warm.  Neurological:     General: No focal deficit present.     Mental Status: She is alert and oriented to person, place, and time.  Psychiatric:        Behavior: Behavior normal.        Judgment: Judgment normal.      LABORATORY DATA:  I have reviewed the data as listed Lab Results  Component Value Date    WBC 5.1 02/22/2024   HGB 12.3 02/22/2024   HCT 36.2 02/22/2024   MCV 85.2 02/22/2024   PLT 134 (L) 02/22/2024   Recent Labs    08/17/23 1519 11/18/23 1305 02/22/24 1412  NA 136 138 137  K 3.1* 3.3* 3.2*  CL 103 106 104  CO2 27 25 24   GLUCOSE 135* 163* 184*  BUN 14 13 13   CREATININE 0.69 0.58 0.73  CALCIUM  9.0 8.0* 9.0  GFRNONAA >60 >60 >60  PROT 6.8 6.3* 6.6  ALBUMIN 3.1* 2.9* 3.3*  AST 24 23 32  ALT 17 16 21   ALKPHOS 46 57 57  BILITOT 0.7 0.4 0.7    RADIOGRAPHIC STUDIES: I have personally reviewed the radiological images as listed and agreed with the findings  in the report. No results found.   ASSESSMENT & PLAN:   Carcinoma of upper-outer quadrant of right breast in female, estrogen receptor positive (HCC) # MAY 2023- STAGE III-T4N1 ER/PR positive HER2 POSITIVE RIGHT breast cancer; LN-positive-ER/PR positive her-2 NEGATIVE; # s/p neoadjuvant chemotherapy- TCH plus P x 6 cycles- s/p Right mastectomy-[15th, NOV 2023] ypT2 [32mm]; ypN0.  Given partial response/involvement of the breast skin/multifocal disease-postmastectomy s/p RT- 08/27/2022. Patient currently on tamoxifen  [DEC-JAN 2024]; + OFS- Lupron  every 3 months ]. S/p  Ado-trastuzumab emtansine  x 14 cycles.   # JUNE 2nd, 2025- Stable examination without evidence of new or progressive disease in the chest, abdomen or pelvis;  Stable prominent mediastinal, mesenteric and retroperitoneal lymph nodes; Stable scattered pulmonary nodules. Monitor for now- will plan imaging in 4-6 months.   # I discussed with the patient regarding-given her high risk disease at baseline and also given the lack of complete pathologic response status post neoadjuvant chemotherapy her overall risk of recurrence is about 20 to 30%.  Given the absence of any prospective clinical trials of the use of neratinib in absence post TDM 1 therapy-I think it still reasonable to consider neratinib adjuvant for total of 1 year especially as she is ER/PR  positive.  # Discussed the potential risk of diarrhea, and skin rash and other GI side effects with neratinib.  However discussed the use of stepup approach would recommend every 2 weeks rather than every week step up.  Discussed with Isaiah from pharmacy.  Patient could potentially get started for step up dosing on 13 November.   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 # [facility] re: right knee pain   # CT MARCH 2025- incidental- Fatty liver infiltration with mild splenomegaly and varices/ rule out  chronic liver disease- - s/p KC- GI evaluation-  MAY 2025- EGD-negative/mild gastritis-ultrasound suggestive of fatty liver- stable.   # Mild Thrombocytopenia- >100  monitor for now. Stable.   # Hypocalcemia-  ON adjuvant Zometa  q 6 months x 3 years [first- May-17th, 2024].   Continue ca+Vit D BID. Vit D FEB 2024- 54; [albumin 3.0]-  Stable.  # Right sided UE swelling/tightness- s/p  Maureen-  Stable.  # Infusion reaction - ZOmeta  [flu like symtoms]- imprived with NSAIDs-   Stable.  # Diabetes- BG 168 [post lunch]- on  Farxiga 10 mg- [OFF ozempic/metformin- intolerance]; on Farxiga-noted to have weight gain.  Defer to PCP-   # Hypokalemia: continue Kdur powder daily-  stable.  # PV access: port functional- # port/IV access-  flush q 6- 8 weeks for now.   Lupron  05/13/2023  q 63M; Zometa  [over 30 mins] q 6 M- #1 10/22/22   # DISPOSITION: # follow up with APP in 3rd week of OCT 27th -APP- labs- cbc/cmp; mag-  # follow up with MD- NOV 17th week-  labs- cbc/cmp; mag-  # keep other appts as planned-  - Dr.B     All questions were answered. The patient/family knows to call the clinic with any problems, questions or concerns.     Cindy JONELLE Joe, MD 03/13/2024 8:46 PM

## 2024-03-13 NOTE — Telephone Encounter (Signed)
 Oral Oncology Patient Advocate Encounter  Prior Authorization for Nerlynx has been approved.    PA# [id number] Effective dates: 03/13/2024 through 03/13/2025  Patients co-pay is $50.00.    Melrose Kearse (Patty) Chet Burnet, CPhT  [facility], [place], [facility] - Oral Chemotherapy Patient Advocate Specialist III Phone: [phone]  Fax: [phone]

## 2024-03-13 NOTE — Telephone Encounter (Signed)
 Oral Oncology Patient Advocate Encounter   New authorization   Received notification that prior authorization for Nerlynx is required.   PA submitted on CMM via Latent Key [id number] Status is pending     Marilyn Page (Patty) Chet Burnet, CPhT  [facility], [place], [facility] - Oral Chemotherapy Patient Advocate Specialist III Phone: [phone]  Fax: [phone]

## 2024-03-13 NOTE — Telephone Encounter (Addendum)
 Oral Oncology Patient Advocate Encounter   Was successful in obtaining a copay card for Nerlynx.  This copay card will make the patients copay ~$10.  The copay card has an annual maximum benefit of $25,000.00.   The billing information is as follows and has been shared with [facility].   RxBin: [id number] PCN: [id number] Member ID: [id number] Group ID: [id number]     Mykenzi Vanzile (Patty) Chet Burnet, CPhT  [facility] [place], [place], [facility] - Oral Chemotherapy Patient Advocate Specialist III Phone: [phone]  Fax: [phone]

## 2024-03-16 ENCOUNTER — Telehealth: Payer: Self-pay | Admitting: Pharmacist

## 2024-03-16 ENCOUNTER — Telehealth: Payer: Self-pay | Admitting: Pharmacy Technician

## 2024-03-16 NOTE — Telephone Encounter (Signed)
 Oral Oncology Patient Advocate Encounter  Mrs. Marilyn Page called me to ask that I call [facility] due to them not wanting to utilize the copay card that was faxed to them.  I called [facility] and spoke with a representative who then transferred me to the shipping department. I then got transferred to the billing department since the medication had already been shipped per the shipping department representative.  [facility] billing department stated they would take care of rebilling. The representative stated that the rebilling should reflect in the patient's account in 7-10 business days and she is able to check via the app/portal.  I have spoken with the patient.  Marilyn Page (Patty) Chet Burnet, CPhT  [facility], [place], [facility] - Oral Chemotherapy Patient Advocate Specialist III Phone: [phone]  Fax: [phone]

## 2024-03-16 NOTE — Telephone Encounter (Signed)
 Oral Oncology Patient Advocate Encounter  Per patient, medication is set to be delivered no later than 03/19/2024.  Ely Spragg (Patty) Chet Burnet, CPhT  [facility], [place], [facility] - Oral Chemotherapy Patient Advocate Specialist III Phone: [phone]  Fax: [phone]

## 2024-03-16 NOTE — Telephone Encounter (Signed)
 Oral Oncology Pharmacist Encounter  Received notification patient had question regarding if there was a drug-drug interaction between prednisone and neratinib. Called and spoke with patient and let her know there is no interaction and it is OK for her to finish her prednisone prescription as planned.   Patient knows to call the office with any other questions or concerns.   Asberry Macintosh, PharmD, BCPS, BCOP Hematology/Oncology Clinical Pharmacist [phone] 03/16/2024 2:35 PM

## 2024-03-20 ENCOUNTER — Encounter: Payer: Self-pay | Admitting: Internal Medicine

## 2024-03-20 NOTE — Progress Notes (Signed)
 The patient enrolled in [facility] services on 08/22/2023 and initial behavioral health evaluation was conducted on 08/24/2023.

## 2024-03-21 ENCOUNTER — Telehealth: Payer: Self-pay

## 2024-03-22 NOTE — Telephone Encounter (Signed)
 Oncology Pharmacist Encounter  Received notification that patient called after hours to ask if both the prednisone and nerlynx were increasing the patients blood sugars. I discussed with patient and NP that it is likely all from the prednisone which was prescribed by [facility] for a heal spur. I requested that patient call [facility] after discussion with NP as patient states that her blood sugars were running in the mid 300s and is normally in the very low 100s. Patient agrees with plan and will call [facility] before they close on 03/21/24.  Tatijana Bierly, PharmD Hematology/Oncology Clinical Pharmacist [facility] [phone]

## 2024-03-26 ENCOUNTER — Other Ambulatory Visit: Payer: Self-pay | Admitting: Internal Medicine

## 2024-03-26 DIAGNOSIS — Z170 Estrogen receptor positive status [ER+]: Secondary | ICD-10-CM

## 2024-03-27 ENCOUNTER — Encounter: Payer: Self-pay | Admitting: Internal Medicine

## 2024-04-02 ENCOUNTER — Encounter: Payer: Self-pay | Admitting: Nurse Practitioner

## 2024-04-02 ENCOUNTER — Inpatient Hospital Stay: Admitting: Nurse Practitioner

## 2024-04-02 ENCOUNTER — Inpatient Hospital Stay

## 2024-04-02 VITALS — BP 116/69 | HR 74 | Temp 97.6°F | Resp 16 | Ht 66.0 in | Wt 233.9 lb

## 2024-04-02 DIAGNOSIS — C50411 Malignant neoplasm of upper-outer quadrant of right female breast: Secondary | ICD-10-CM

## 2024-04-02 DIAGNOSIS — Z170 Estrogen receptor positive status [ER+]: Secondary | ICD-10-CM

## 2024-04-02 DIAGNOSIS — Z79899 Other long term (current) drug therapy: Secondary | ICD-10-CM

## 2024-04-02 DIAGNOSIS — C50211 Malignant neoplasm of upper-inner quadrant of right female breast: Secondary | ICD-10-CM | POA: Diagnosis not present

## 2024-04-02 LAB — CBC WITH DIFFERENTIAL (CANCER CENTER ONLY)
Abs Immature Granulocytes: 0.04 K/uL (ref 0.00–0.07)
Basophils Absolute: 0.1 K/uL (ref 0.0–0.1)
Basophils Relative: 1 %
Eosinophils Absolute: 0.2 K/uL (ref 0.0–0.5)
Eosinophils Relative: 3 %
HCT: 36.8 % (ref 36.0–46.0)
Hemoglobin: 12.4 g/dL (ref 12.0–15.0)
Immature Granulocytes: 1 %
Lymphocytes Relative: 25 %
Lymphs Abs: 1.4 K/uL (ref 0.7–4.0)
MCH: 28.9 pg (ref 26.0–34.0)
MCHC: 33.7 g/dL (ref 30.0–36.0)
MCV: 85.8 fL (ref 80.0–100.0)
Monocytes Absolute: 0.4 K/uL (ref 0.1–1.0)
Monocytes Relative: 7 %
Neutro Abs: 3.3 K/uL (ref 1.7–7.7)
Neutrophils Relative %: 63 %
Platelet Count: 132 K/uL — ABNORMAL LOW (ref 150–400)
RBC: 4.29 MIL/uL (ref 3.87–5.11)
RDW: 13.5 % (ref 11.5–15.5)
WBC Count: 5.4 K/uL (ref 4.0–10.5)
nRBC: 0 % (ref 0.0–0.2)

## 2024-04-02 LAB — CMP (CANCER CENTER ONLY)
ALT: 24 U/L (ref 0–44)
AST: 25 U/L (ref 15–41)
Albumin: 3.1 g/dL — ABNORMAL LOW (ref 3.5–5.0)
Alkaline Phosphatase: 52 U/L (ref 38–126)
Anion gap: 8 (ref 5–15)
BUN: 14 mg/dL (ref 6–20)
CO2: 24 mmol/L (ref 22–32)
Calcium: 8.3 mg/dL — ABNORMAL LOW (ref 8.9–10.3)
Chloride: 106 mmol/L (ref 98–111)
Creatinine: 0.71 mg/dL (ref 0.44–1.00)
GFR, Estimated: >60 mL/min (ref >60)
Glucose, Bld: 152 mg/dL — ABNORMAL HIGH (ref 70–99)
Potassium: 3.2 mmol/L — ABNORMAL LOW (ref 3.5–5.1)
Sodium: 138 mmol/L (ref 135–145)
Total Bilirubin: 0.5 mg/dL (ref 0.0–1.2)
Total Protein: 6.4 g/dL — ABNORMAL LOW (ref 6.5–8.1)

## 2024-04-02 LAB — MAGNESIUM: Magnesium: 1.9 mg/dL (ref 1.7–2.4)

## 2024-04-02 NOTE — Progress Notes (Signed)
 She increases the Neratinib Maleate today to 160mg . So far no side effects.

## 2024-04-02 NOTE — Progress Notes (Signed)
 [facility] CONSULT NOTE  Patient Care Team: [name], NP as PCP - General (Family Medicine) [name], RN as Oncology Nurse Navigator [name], [name]  CHIEF COMPLAINTS/PURPOSE OF CONSULTATION: Breast cancer  Oncology History Overview Note  MAY 2023-    Targeted ultrasound is performed, showing a 2.9 x 1.9 x 2.0 cm irregular hypoechoic mass right breast 10 o'clock position 6 cm from nipple at the site of palpable concern.   There is an abnormal 8 mm thickened right axillary lymph node.   There is skin thickening overlying the right breast. ------------------  A. BREAST, RIGHT AT 10:00, 6 CM FROM THE NIPPLE; ULTRASOUND-GUIDED CORE  NEEDLE BIOPSY:  - INVASIVE MAMMARY CARCINOMA, NO SPECIAL TYPE.   Size of invasive carcinoma: 11 mm in this sample  Histologic grade of invasive carcinoma: Grade 2                       Glandular/tubular differentiation score: 3                       Nuclear pleomorphism score: 2                       Mitotic rate score: 1                       Total score: 6  Ductal carcinoma in situ: Present, intermediate grade  Lymphovascular invasion: Not identified   B. LYMPH NODE, RIGHT AXILLARY; ULTRASOUND-GUIDED CORE NEEDLE BIOPSY:  - 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 IN GREATEST  EXTENT.   Comment:  The malignancy in the primary breast lesion appears morphologically  different from tumor in the lymph node sampling, with tumor present in  lymph node more suggestive of a histologic grade 3, with significantly  increased mitotic activity and pleomorphism. Due to the discrepancy in  these morphologic patterns, ER/PR/HER2 immunohistochemistry will be  performed on both blocks A1 and B1, with reflex to FISH for HER2 2+. The  results will be reported in an addendum.   ADDENDUM:  CASE SUMMARY: BREAST BIOMARKER TESTS - A - BREAST, RIGHT AT 10:00  Estrogen Receptor  (ER) Status: POSITIVE          Percentage of cells with nuclear positivity: 71-80%          Average intensity of staining: Strong   Progesterone Receptor (PgR) Status: POSITIVE          Percentage of cells with nuclear positivity: 81-90%          Average intensity of staining: Strong   HER2 (by immunohistochemistry): POSITIVE (Score 3+)       Percentage of cells with uniform intense complete membrane  staining: 61-70%  HER2 displays a heterogeneous staining pattern, with areas showing a  strong 3+ staining pattern, and other regions with complete absence (0+)  of staining.   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  Discussed with Dr. Mabeline tumor heterogeneity. BREAST MRI- JUNE 2023- The patient's known malignancy in the upper outer quadrant of the right breast measures 3.8 x 2.8 x 3.6 cm. Numerous other suspicious masses are identified in the right breast as above involving the upper outer and upper inner quadrants, located both anterior and posterior to the known malignancy.  Numerous enhancing foci in the skin as above are worrisome for skin metastases ; Bx proven. Biopsy proven metastatic node in the right axilla.  No evidence of malignancy in the left breast;  Two mildly prominent right internal mammary nodes were not FDG avid on recent PET-CT imaging.  Currently on neoadjuvant chemotherapy- TCH  plus P x6 cycles;  MUGA scan May 31st, 2023- -61%.    # June 7th, 2023- NEO-ADJUVANT CHEMO- TCH+P x6 cycles- s/p Mastectomy [NOV 15th, 2023] [Dr.Byrnett]-  ypT2 [61mm]; ypN0.  # JAN mid, 2024 -TAMOXFEN 20 mg a day; ADD LupronFEB 2nd.2 2024 s/p RT maech, 22nd- 2024  # FEB 2nd, 2023- Kadcyla  q 3 W x14 cycles- finished dec 2024.   # OCT 17th, 2025- start NERATINIB step up dose.   # Lupron  q3 M [/start 2/07-2022]   # Genetic counseling s/p- NEG for any deleterious mutations.     Carcinoma of upper-outer quadrant of right breast in female, estrogen receptor positive (HCC)  10/26/2021 Initial Diagnosis   Carcinoma of upper-outer quadrant of right breast in female, estrogen receptor positive (HCC)   10/26/2021 Cancer Staging   Staging form: Breast, AJCC 8th Edition - Clinical: Stage IIIA (cT4d, cN1, cM0, G2, ER+, PR+, HER2+) - Signed by [name], MD on 06/04/2022 Histologic grading system: 3 grade system   11/10/2021 - 03/01/2022 Chemotherapy   Patient is on Treatment Plan : BREAST  Docetaxel  + Carboplatin  + Trastuzumab  + Pertuzumab   (TCHP) q21d      11/11/2021 - 01/13/2022 Chemotherapy   Patient is on Treatment Plan : BREAST  Docetaxel  + Carboplatin  + Trastuzumab  + Pertuzumab   (TCHP) q21d       Genetic Testing   Negative genetic testing. No pathogenic variants identified on the [facility] CancerNext-Expanded+RNA panel. The report date is 11/29/2021.  The CancerNext-Expanded + RNAinsight gene panel offered by [employer] and includes sequencing and rearrangement analysis for the following 77 genes: IP, ALK, APC*, ATM*, AXIN2, BAP1, BARD1, BLM, BMPR1A, BRCA1*, BRCA2*, BRIP1*, CDC73, CDH1*,CDK4, CDKN1B, CDKN2A, CHEK2*, CTNNA1, DICER1, FANCC, FH, FLCN, GALNT12, KIF1B, LZTR1, MAX, MEN1, MET, MLH1*, MSH2*, MSH3, MSH6*, MUTYH*, NBN, NF1*, NF2, NTHL1, PALB2*, PHOX2B, PMS2*, POT1, PRKAR1A, PTCH1, PTEN*, RAD51C*, RAD51D*,RB1, RECQL, RET, SDHA, SDHAF2, SDHB, SDHC, SDHD, SMAD4, SMARCA4, SMARCB1,  SMARCE1, STK11, SUFU, TMEM127, TP53*,TSC1, TSC2, VHL and XRCC2 (sequencing and deletion/duplication); EGFR, EGLN1, HOXB13, KIT, MITF, PDGFRA, POLD1 and POLE (sequencing only); EPCAM and GREM1 (deletion/duplication only).   07/09/2022 -  Chemotherapy   Patient is on Treatment Plan : BREAST ADO-Trastuzumab Emtansine  (Kadcyla ) q21d      HISTORY OF PRESENTING ILLNESS: Patient is ambulating independently.  Husband over the phone.  Marilyn Page 48 y.o. female with triple positive T4, stage III, right breast cancer status postmastectomy, currently on tamoxifen  + OFS, who returns to clinic for follow-up.  She last received Lupron  in September. She started neratinib 2 weeks ago. One episode of  low back pain. No gi side effects or rash. Denies complaints. Weight up.    Review of Systems  Constitutional:  Positive for malaise/fatigue. Negative for chills, diaphoresis, fever and weight loss.  HENT:  Negative for nosebleeds and sore throat.   Eyes:  Negative for double vision.  Respiratory:  Negative for cough, hemoptysis, sputum production, shortness of breath and wheezing.   Cardiovascular:  Negative for chest pain, palpitations, orthopnea and leg swelling.  Gastrointestinal:  Negative for abdominal pain, blood in stool, constipation, diarrhea, heartburn, melena, nausea and vomiting.  Genitourinary:  Negative for dysuria, frequency and urgency.  Musculoskeletal:  Positive for joint pain and myalgias. Negative for back pain.  Skin: Negative.  Negative for itching and rash.  Neurological:  Negative for dizziness, tingling, focal weakness, weakness and headaches.  Endo/Heme/Allergies:  Does not bruise/bleed easily.  Psychiatric/Behavioral:  Negative for depression. The patient is not nervous/anxious and does not have insomnia.      MEDICAL HISTORY:  Past Medical History:  Diagnosis Date   Anemia    Carcinoma of upper-outer quadrant of right breast in female, estrogen receptor positive (HCC)  10/26/2021   Diabetes mellitus without complication (HCC)    Family history of adverse reaction to anesthesia    grandmother had issues but doesnt know what the issue was, she was older.   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 Axilla Butterfly hydro clip-MACRO METASTATIC MAMMARY CARCINOMA   BREAST BIOPSY Right 04/21/2022   US  RT PLC BREAST LOC DEV   1ST LESION  INC US  GUIDE 04/21/2022 [facility]   BREAST RECONSTRUCTION WITH PLACEMENT OF TISSUE EXPANDER AND FLEX HD (ACELLULAR HYDRATED DERMIS) Right 04/21/2022   Procedure: RIGHT BREAST RECONSTRUCTION WITH PLACEMENT OF TISSUE EXPANDER AND FLEX HD (ACELLULAR HYDRATED DERMIS);  Surgeon: Lowery Estefana RAMAN, DO;  Location: [facility];  Service: Plastics;  Laterality: Right;   CESAREAN SECTION  2005/2008   DILATION AND CURETTAGE OF UTERUS  2003   ESOPHAGOGASTRODUODENOSCOPY N/A 11/02/2023   Procedure: EGD (ESOPHAGOGASTRODUODENOSCOPY);  Surgeon: Toledo, Ladell POUR, MD;  Location: [facility];  Service: Gastroenterology;  Laterality: N/A;   MASTECTOMY Right 04/2023   PORTACATH PLACEMENT Left 11/09/2021   Procedure: INSERTION PORT-A-CATH;  Surgeon: Dessa Reyes ORN, MD;  Location: [facility];  Service: General;  Laterality: Left;   REMOVAL OF TISSUE EXPANDER AND PLACEMENT OF IMPLANT Right 06/08/2022   Procedure: REMOVAL OF TISSUE EXPANDER;  Surgeon: Lowery Estefana RAMAN, DO;  Location: [facility];  Service: Plastics;  Laterality: Right;   SIMPLE MASTECTOMY WITH AXILLARY SENTINEL NODE BIOPSY Right 04/21/2022   Procedure: SIMPLE MASTECTOMY WITH AXILLARY SENTINEL NODE BIOPSY;  Surgeon: Dessa Reyes ORN, MD;  Location: [facility];  Service: General;  Laterality: Right;  wire localization of lymph node   SKIN BIOPSY Right 11/09/2021   Procedure: PUNCH BIOPSY SKIN, TATTOO LYMPH NODE RIGHT AXILLA;  Surgeon: Dessa Reyes ORN, MD;  Location: [facility];  Service: General;  Laterality: Right;   WISDOM TOOTH EXTRACTION      SOCIAL HISTORY: Social History   Socioeconomic History   Marital status: Married    Spouse name: Bonnie  Number of children: 2   Years of education: Not on file   Highest education level: Not on file  Occupational History   Occupation: and architectural technologist  Tobacco Use   Smoking status: Never   Smokeless tobacco: Never  Vaping Use   Vaping status: Never Used  Substance and Sexual Activity   Alcohol use: Never   Drug use: Never   Sexual activity: Yes    Birth control/protection: Other-see comments, Surgical    Comment: vasectomy  Other Topics Concern   Not on file  Social History Narrative   Pre-school teacher; in [place]; 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    Social Drivers of Corporate Investment Banker Strain: Low Risk  (01/02/2024)   Received [facility]   Overall Financial Resource Strain (CARDIA)    Difficulty of Paying Living Expenses: Not hard at all  Food Insecurity: No Food Insecurity (01/02/2024)   Received from [facility]   Hunger Vital Sign    Within the past 12 months, you worried that your food would run out before you got the money to buy more.: Never true    Within the past 12 months, the food you bought just didn't last and you didn't have money to get more.: Never true  Transportation Needs: No Transportation Needs (01/02/2024)   Received from [facility] - Transportation    In the past 12 months, has lack of transportation kept you from medical appointments or from getting medications?: No    Lack of Transportation (Non-Medical): No  Physical Activity: Inactive  (09/26/2017)   Exercise Vital Sign    Days of Exercise per Week: 0 days    Minutes of Exercise per Session: 0 min  Stress: Not on file  Social Connections: Not on file  Intimate Partner Violence: Not on file    FAMILY HISTORY: Family History  Problem Relation Age of Onset   Ovarian cysts Mother    Migraines Mother    Melanoma Mother        on leg   Hyperlipidemia Father    Diabetes Maternal Aunt    Stomach cancer Paternal Aunt    Diabetes Maternal Grandfather    Lymphoma Cousin 17   Breast cancer Neg Hx     ALLERGIES:  is allergic to metformin and oxycodone .  MEDICATIONS:  Current Outpatient Medications  Medication Sig Dispense Refill   acetaminophen  (TYLENOL ) 325 MG tablet Take 2 tablets (650 mg total) by mouth every 6 (six) hours as needed for mild pain (or Fever >/= 101).     ascorbic acid  (VITAMIN C) 500 MG tablet Take 1 tablet (500 mg total) by mouth daily. 30 tablet    calcium -vitamin D  (OSCAL WITH D) 500-5 MG-MCG tablet Take 1 tablet by mouth 2 (two) times daily. (Patient taking differently: Take 1 tablet by mouth daily with breakfast.) 60 tablet 2   Dapagliflozin Propanediol (FARXIGA PO) Take 10 mg by mouth daily.     lidocaine -prilocaine  (EMLA ) cream Apply on the port. 30 -45 min  prior to port access. 30 g 3   loperamide  (IMODIUM ) 2 MG capsule Take 2 capsules (4 mg total) by mouth 4 (four) times daily as needed for diarrhea or loose stools. (MAX 16 mg/day) 240 capsule 1   Neratinib Maleate (NERLYNX) 40 MG tablet Take 3 tablets (120mg ) by mouth for 2 weeks, then 4 tablets (160) for 2 weeks, then 6 tablets (240mg ) thereafter. Take with food. 110 tablet 0  ondansetron  (ZOFRAN ) 4 MG tablet Take 1 tablet (4 mg total) by mouth every 8 (eight) hours as needed for up to 20 doses for nausea or vomiting. 20 tablet 0   ondansetron  (ZOFRAN -ODT) 8 MG disintegrating tablet Take 8 mg by mouth every 8 (eight) hours as needed for nausea or vomiting.     ONETOUCH VERIO test strip  SMARTSIG:1 Each Via Meter Daily PRN     pioglitazone (ACTOS) 15 MG tablet Take 15 mg by mouth daily.     potassium chloride  (KLOR-CON ) 20 MEQ packet TAKE 20 MEQ BY MOUTH 2 (TWO) TIMES DAILY. 180 packet 1   prochlorperazine  (COMPAZINE ) 10 MG tablet Take 1 tablet (10 mg total) by mouth every 6 (six) hours as needed for nausea or vomiting. 40 tablet 1   tamoxifen  (NOLVADEX ) 20 MG tablet TAKE 1 TABLET BY MOUTH EVERY DAY 90 tablet 1   No current facility-administered medications for this visit.   Facility-Administered Medications Ordered in Other Visits  Medication Dose Route Frequency Provider Last Rate Last Admin   sodium chloride  flush (NS) 0.9 % injection 10 mL  10 mL Intravenous PRN Brahmanday, Govinda R, MD   10 mL at 08/17/23 1525    PHYSICAL EXAMINATION: ECOG PERFORMANCE STATUS: 0 - Asymptomatic  There were no vitals filed for this visit.  Filed Weights   04/02/24 1305  Weight: 233 lb 14.4 oz (106.1 kg)   Physical Exam Vitals and nursing note reviewed.  HENT:     Head: Normocephalic and atraumatic.     Mouth/Throat:     Pharynx: Oropharynx is clear.  Eyes:     Extraocular Movements: Extraocular movements intact.     Pupils: Pupils are equal, round, and reactive to light.  Cardiovascular:     Rate and Rhythm: Normal rate and regular rhythm.  Pulmonary:     Comments: Decreased breath sounds bilaterally.  Abdominal:     Palpations: Abdomen is soft.  Musculoskeletal:        General: Normal range of motion.     Cervical back: Normal range of motion.  Skin:    General: Skin is warm.  Neurological:     General: No focal deficit present.     Mental Status: She is alert and oriented to person, place, and time.  Psychiatric:        Behavior: Behavior normal.        Judgment: Judgment normal.    LABORATORY DATA:  I have reviewed the data as listed Lab Results  Component Value Date   WBC 5.4 04/02/2024   HGB 12.4 04/02/2024   HCT 36.8 04/02/2024   MCV 85.8 04/02/2024    PLT 132 (L) 04/02/2024   Recent Labs    11/18/23 1305 02/22/24 1412 04/02/24 1237  NA 138 137 138  K 3.3* 3.2* 3.2*  CL 106 104 106  CO2 25 24 24   GLUCOSE 163* 184* 152*  BUN 13 13 14   CREATININE 0.58 0.73 0.71  CALCIUM  8.0* 9.0 8.3*  GFRNONAA >60 >60 >60  PROT 6.3* 6.6 6.4*  ALBUMIN 2.9* 3.3* 3.1*  AST 23 32 25  ALT 16 21 24   ALKPHOS 57 57 52  BILITOT 0.4 0.7 0.5    RADIOGRAPHIC STUDIES: I have personally reviewed the radiological images as listed and agreed with the findings in the report. No results found.   ASSESSMENT & PLAN:   Carcinoma of upper-outer quadrant of right breast in female, estrogen receptor positive (HCC)- MAY 2023- STAGE III-T4N1 ER/PR positive HER2 POSITIVE  RIGHT breast cancer; LN-positive-ER/PR positive her-2 NEGATIVE; # s/p neoadjuvant chemotherapy- TCH plus P x 6 cycles- s/p Right mastectomy-[15th, NOV 2023] ypT2 [4mm]; ypN0.  Given partial response/involvement of the breast skin/multifocal disease-postmastectomy s/p RT- 08/27/2022. Patient currently on tamoxifen  [DEC-JAN 2024]; + OFS- Lupron  every 3 months ]. S/p  Ado-trastuzumab emtansine  x 14 cycles.   # JUNE 2nd, 2025- Stable examination without evidence of new or progressive disease in the chest, abdomen or pelvis;  Stable prominent mediastinal, mesenteric and retroperitoneal lymph nodes; Stable scattered pulmonary nodules. Monitor for now- follow up imaging after November visit.   # Given lack of complete pathologic response post neoadjuvant chemotherapy, high risk disease at baseline, and her overall risk of recurrence is about 20 to 30%.  Plan will consider neratinib adjuvantly for a total of 1 year particularly in view of her ER/PR positivity.  # Previously discussed stepping up her dosage every 2 weeks as opposed to every week. She is now s/p 2 weeks at 120 mg daily dose. Tolerating well. Labs reviewed and acceptable. Plan to step up to 160 mg daily dosing. Currently no diarrhea , rash or GI  side effects.    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 S/p [facility] re: right knee pain   # CT MARCH 2025- incidental- Fatty liver infiltration with mild splenomegaly and varices/ rule out  chronic liver disease- - s/p [facility]- GI evaluation-  MAY 2025- EGD-negative/mild gastritis-ultrasound suggestive of fatty liver- stable.   # Mild Thrombocytopenia- >100  monitor for now. Stable.    # Hypocalcemia-  ON adjuvant Zometa  q 6 months x 3 years [first- May-17th, 2024].   Continue ca+Vit D BID. Vit D FEB 2024- 54; [albumin 3.0]-  Stable.   # Right sided UE swelling/tightness- s/p  Maureen-  Stable.  # Infusion reaction - ZOmeta  [flu like symtoms]- imprived with NSAIDs-   Stable.  # Diabetes- BG 168 [post lunch]- on  Farxiga 10 mg- [OFF ozempic/metformin- intolerance]; on Farxiga-noted to have weight gain.  Defer to PCP-   # Hypokalemia: restart Kdur powder daily-  stable.  # PV access: port functional- # port/IV access-  flush q 6- 8 weeks for now.   Lupron  05/13/2023  q 24M; Zometa  [over 30 mins] q 6 M- #1 10/22/22   # DISPOSITION: Keep follow ups as planned- la  No problem-specific Assessment & Plan notes found for this encounter.  All questions were answered. The patient/family knows to call the clinic with any problems, questions or concerns.   Tinnie KANDICE Dawn, NP 04/02/2024

## 2024-04-04 ENCOUNTER — Encounter

## 2024-04-06 ENCOUNTER — Other Ambulatory Visit: Payer: Self-pay

## 2024-04-06 DIAGNOSIS — C50411 Malignant neoplasm of upper-outer quadrant of right female breast: Secondary | ICD-10-CM

## 2024-04-06 MED ORDER — NERATINIB MALEATE 40 MG PO TABS: ORAL_TABLET | ORAL | 0 refills | Status: DC

## 2024-04-10 ENCOUNTER — Telehealth: Payer: Self-pay

## 2024-04-10 DIAGNOSIS — C50411 Malignant neoplasm of upper-outer quadrant of right female breast: Secondary | ICD-10-CM

## 2024-04-10 MED ORDER — NERATINIB MALEATE 40 MG PO TABS
240.0000 mg | ORAL_TABLET | Freq: Every day | ORAL | 0 refills | Status: DC
Start: 2024-04-10 — End: 2024-04-27

## 2024-04-10 NOTE — Telephone Encounter (Signed)
 Looks like Rx authorized last week was with repeat of the initial dose escalation instructions instead of maintenance.   Prescription to maintenance dosing sent to [facility].

## 2024-04-10 NOTE — Addendum Note (Signed)
 Addended by: RODGERS RENAEE SAILOR on: 04/10/2024 12:54 PM   Modules accepted: Orders

## 2024-04-10 NOTE — Telephone Encounter (Signed)
 [facility] requesting prescription for maintenance dose of Nerlynx.  They did receive recent rx for starter dosing but patient has already received and now needs maintenance dosing.

## 2024-04-17 ENCOUNTER — Encounter: Payer: Self-pay | Admitting: Internal Medicine

## 2024-04-17 ENCOUNTER — Other Ambulatory Visit: Payer: Self-pay | Admitting: Nurse Practitioner

## 2024-04-25 NOTE — Progress Notes (Unsigned)
 [facility] CONSULT NOTE  Patient Care Team: [name], NP as PCP - General (Family Medicine) [name], RN as Oncology Nurse Navigator [name], [name]  CHIEF COMPLAINTS/PURPOSE OF CONSULTATION: Breast cancer  Oncology History Overview Note  MAY 2023-    Targeted ultrasound is performed, showing a 2.9 x 1.9 x 2.0 cm irregular hypoechoic mass right breast 10 o'clock position 6 cm from nipple at the site of palpable concern.   There is an abnormal 8 mm thickened right axillary lymph node.   There is skin thickening overlying the right breast. ------------------  A. BREAST, RIGHT AT 10:00, 6 CM FROM THE NIPPLE; ULTRASOUND-GUIDED CORE  NEEDLE BIOPSY:  - INVASIVE MAMMARY CARCINOMA, NO SPECIAL TYPE.   Size of invasive carcinoma: 11 mm in this sample  Histologic grade of invasive carcinoma: Grade 2                       Glandular/tubular differentiation score: 3                       Nuclear pleomorphism score: 2                       Mitotic rate score: 1                       Total score: 6  Ductal carcinoma in situ: Present, intermediate grade  Lymphovascular invasion: Not identified   B. LYMPH NODE, RIGHT AXILLARY; ULTRASOUND-GUIDED CORE NEEDLE BIOPSY:  - 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 IN GREATEST  EXTENT.   Comment:  The malignancy in the primary breast lesion appears morphologically  different from tumor in the lymph node sampling, with tumor present in  lymph node more suggestive of a histologic grade 3, with significantly  increased mitotic activity and pleomorphism. Due to the discrepancy in  these morphologic patterns, ER/PR/HER2 immunohistochemistry will be  performed on both blocks A1 and B1, with reflex to FISH for HER2 2+. The  results will be reported in an addendum.   ADDENDUM:  CASE SUMMARY: BREAST BIOMARKER TESTS - A - BREAST, RIGHT AT 10:00  Estrogen Receptor  (ER) Status: POSITIVE          Percentage of cells with nuclear positivity: 71-80%          Average intensity of staining: Strong   Progesterone Receptor (PgR) Status: POSITIVE          Percentage of cells with nuclear positivity: 81-90%          Average intensity of staining: Strong   HER2 (by immunohistochemistry): POSITIVE (Score 3+)       Percentage of cells with uniform intense complete membrane  staining: 61-70%  HER2 displays a heterogeneous staining pattern, with areas showing a  strong 3+ staining pattern, and other regions with complete absence (0+)  of staining.   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  Discussed with Dr. Mabeline tumor heterogeneity. BREAST MRI- JUNE 2023- The patient's known malignancy in the upper outer quadrant of the right breast measures 3.8 x 2.8 x 3.6 cm. Numerous other suspicious masses are identified in the right breast as above involving the upper outer and upper inner quadrants, located both anterior and posterior to the known malignancy.  Numerous enhancing foci in the skin as above are worrisome for skin metastases ; Bx proven. Biopsy proven metastatic node in the right axilla.  No evidence of malignancy in the left breast;  Two mildly prominent right internal mammary nodes were not FDG avid on recent PET-CT imaging.  Currently on neoadjuvant chemotherapy- TCH  plus P x6 cycles;  MUGA scan May 31st, 2023- -61%.    # June 7th, 2023- NEO-ADJUVANT CHEMO- TCH+P x6 cycles- s/p Mastectomy [NOV 15th, 2023] [Dr.Byrnett]-  ypT2 [78mm]; ypN0.  # JAN mid, 2024 -TAMOXFEN 20 mg a day; ADD LupronFEB 2nd.2 2024 s/p RT maech, 22nd- 2024  # FEB 2nd, 2023- Kadcyla  q 3 W x14 cycles- finished dec 2024.   # OCT 17th, 2025- start NERATINIB step up dose.   # Lupron  q3 M [/start 2/07-2022]   # Genetic counseling s/p- NEG for any deleterious mutations.     Carcinoma of upper-outer quadrant of right breast in female, estrogen receptor positive (HCC)  10/26/2021 Initial Diagnosis   Carcinoma of upper-outer quadrant of right breast in female, estrogen receptor positive (HCC)   10/26/2021 Cancer Staging   Staging form: Breast, AJCC 8th Edition - Clinical: Stage IIIA (cT4d, cN1, cM0, G2, ER+, PR+, HER2+) - Signed by [name], MD on 06/04/2022 Histologic grading system: 3 grade system   11/10/2021 - 03/01/2022 Chemotherapy   Patient is on Treatment Plan : BREAST  Docetaxel  + Carboplatin  + Trastuzumab  + Pertuzumab   (TCHP) q21d      11/11/2021 - 01/13/2022 Chemotherapy   Patient is on Treatment Plan : BREAST  Docetaxel  + Carboplatin  + Trastuzumab  + Pertuzumab   (TCHP) q21d       Genetic Testing   Negative genetic testing. No pathogenic variants identified on the [facility] CancerNext-Expanded+RNA panel. The report date is 11/29/2021.  The CancerNext-Expanded + RNAinsight gene panel offered by [employer] and includes sequencing and rearrangement analysis for the following 77 genes: IP, ALK, APC*, ATM*, AXIN2, BAP1, BARD1, BLM, BMPR1A, BRCA1*, BRCA2*, BRIP1*, CDC73, CDH1*,CDK4, CDKN1B, CDKN2A, CHEK2*, CTNNA1, DICER1, FANCC, FH, FLCN, GALNT12, KIF1B, LZTR1, MAX, MEN1, MET, MLH1*, MSH2*, MSH3, MSH6*, MUTYH*, NBN, NF1*, NF2, NTHL1, PALB2*, PHOX2B, PMS2*, POT1, PRKAR1A, PTCH1, PTEN*, RAD51C*, RAD51D*,RB1, RECQL, RET, SDHA, SDHAF2, SDHB, SDHC, SDHD, SMAD4, SMARCA4, SMARCB1,  SMARCE1, STK11, SUFU, TMEM127, TP53*,TSC1, TSC2, VHL and XRCC2 (sequencing and deletion/duplication); EGFR, EGLN1, HOXB13, KIT, MITF, PDGFRA, POLD1 and POLE (sequencing only); EPCAM and GREM1 (deletion/duplication only).   07/09/2022 -  Chemotherapy   Patient is on Treatment Plan : BREAST ADO-Trastuzumab Emtansine  (Kadcyla ) q21d      HISTORY OF PRESENTING ILLNESS: Patient is ambulating independently.  Husband over the phone.  Marilyn Page 48 y.o.  female right breast TRIPLE POSITIVE with T4- Stage III -s/p mastectomy-currently on adjuvant tamoxifen  + OFS + Neratinib  is here for follow-up.   Discussed the use of AI scribe software for clinical note transcription with the  patient, who gave verbal consent to proceed.  History of Present Illness   Marilyn Page is a 48 year old female with breast cancer who presents for follow-up on adjuvant neratinib  therapy.  She has been on neratinib  therapy for HER2 positive breast cancer. Initially, she tolerated doses of three and four pills well, but experienced adverse effects upon increasing to six pills. Her tongue became sore, which improved with baking soda and salt water  rinses. She also experienced a burning sensation on her tongue if she did not have water  immediately after taking the medication.  Two days after starting the six-pill regimen, she began experiencing diarrhea. Initially, the diarrhea was manageable with Imodium , but it recurred the next morning and again after supper, accompanied by severe stomach cramping. She describes having one loose stool per day over the past three days, with Imodium  providing some relief. She also feels nauseous.  Her current medications include neratinib , tamoxifen , and Lupron , which are part of her treatment regimen to lower estrogen levels and target HER2 receptors. She has not experienced any skin rashes or itching, which are other potential side effects of her treatment.  Her recent blood work included  potassium and calcium  levels. She monitors her blood sugar, which was 118 mg/dL this morning after fasting.  No skin rash or itching. No issues with her thighs and no other new symptoms.      Review of Systems  Constitutional:  Positive for malaise/fatigue. Negative for chills, diaphoresis, fever and weight loss.  HENT:  Negative for nosebleeds and sore throat.   Eyes:  Negative for double vision.  Respiratory:  Negative for cough, hemoptysis, sputum production, shortness of breath and wheezing.   Cardiovascular:  Negative for chest pain, palpitations, orthopnea and leg swelling.  Gastrointestinal:  Negative for abdominal pain, blood in stool, constipation, diarrhea, heartburn, melena, nausea and vomiting.  Genitourinary:  Negative for dysuria, frequency and urgency.  Musculoskeletal:  Positive for joint pain and myalgias. Negative for back pain.  Skin: Negative.  Negative for itching and rash.  Neurological:  Negative for dizziness, tingling, focal weakness, weakness and headaches.  Endo/Heme/Allergies:  Does not bruise/bleed easily.  Psychiatric/Behavioral:  Negative for depression. The patient is not nervous/anxious and does not have insomnia.      MEDICAL HISTORY:  Past Medical History:  Diagnosis Date   Anemia    Carcinoma of upper-outer quadrant of right breast in female, estrogen receptor positive (HCC) 10/26/2021   Diabetes mellitus without complication (HCC)    Family history of adverse reaction to anesthesia    grandmother had issues but doesnt know what the issue was, she was older.   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 Axilla Butterfly hydro clip-MACRO METASTATIC MAMMARY CARCINOMA   BREAST BIOPSY Right 04/21/2022   US  RT PLC BREAST LOC DEV   1ST LESION  INC US  GUIDE 04/21/2022 [facility]   BREAST RECONSTRUCTION WITH PLACEMENT OF TISSUE EXPANDER AND FLEX HD (ACELLULAR HYDRATED DERMIS) Right 04/21/2022   Procedure: RIGHT BREAST RECONSTRUCTION WITH PLACEMENT OF TISSUE EXPANDER AND FLEX HD (ACELLULAR HYDRATED DERMIS);  Surgeon: Lowery Estefana RAMAN, DO;  Location: [facility];  Service: Plastics;  Laterality: Right;   CESAREAN SECTION  2005/2008   DILATION AND CURETTAGE OF UTERUS  2003   ESOPHAGOGASTRODUODENOSCOPY N/A 11/02/2023   Procedure: EGD (ESOPHAGOGASTRODUODENOSCOPY);  Surgeon: Toledo, Ladell POUR, MD;  Location: [facility];  Service: Gastroenterology;  Laterality: N/A;   MASTECTOMY Right 04/2023   PORTACATH PLACEMENT Left 11/09/2021   Procedure: INSERTION PORT-A-CATH;  Surgeon: Dessa Reyes ORN, MD;  Location: [facility];  Service: General;  Laterality: Left;   REMOVAL OF TISSUE EXPANDER AND PLACEMENT OF IMPLANT Right 06/08/2022   Procedure: REMOVAL OF TISSUE EXPANDER;  Surgeon: Lowery Estefana RAMAN, DO;  Location: [facility];  Service: Plastics;  Laterality: Right;   SIMPLE MASTECTOMY WITH AXILLARY SENTINEL NODE BIOPSY Right 04/21/2022   Procedure: SIMPLE MASTECTOMY WITH AXILLARY SENTINEL NODE BIOPSY;  Surgeon: Dessa Reyes ORN, MD;  Location: [facility];  Service: General;  Laterality: Right;  wire localization of lymph node   SKIN BIOPSY Right 11/09/2021   Procedure: PUNCH BIOPSY SKIN, TATTOO LYMPH NODE RIGHT AXILLA;  Surgeon: Dessa Reyes ORN, MD;  Location: [facility];  Service: General;  Laterality: Right;   WISDOM TOOTH EXTRACTION      SOCIAL HISTORY: Social History   Socioeconomic History   Marital status: Married    Spouse name: Bonnie   Number of children: 2   Years of education: Not on file   Highest education level: Not on file  Occupational History   Occupation: and  architectural technologist  Tobacco Use   Smoking status: Never   Smokeless tobacco: Never  Vaping Use   Vaping status: Never Used  Substance and Sexual Activity   Alcohol use: Never   Drug use: Never   Sexual activity: Yes    Birth control/protection: Other-see comments, Surgical    Comment: vasectomy  Other Topics Concern   Not on file  Social History Narrative   Pre-school teacher; in [place]; 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    Social Drivers of Corporate Investment Banker Strain: Low Risk  (01/02/2024)   Received from [facility]   Overall Financial Resource Strain (CARDIA)    Difficulty of Paying Living Expenses: Not hard at all  Food Insecurity: No Food Insecurity (01/02/2024)   [facility]   Hunger Vital Sign    Within the past 12 months, you worried that your food would run out before you got the money to buy more.: Never true    Within the past 12 months, the food you bought just didn't last and you didn't have money to get more.: Never true  Transportation Needs: No Transportation Needs (01/02/2024)   Received from [facility] - Transportation    In the past 12 months, has lack of transportation kept you from medical appointments or from getting medications?: No    Lack of Transportation (Non-Medical): No  Physical Activity: Inactive (09/26/2017)   Exercise Vital Sign    Days of Exercise per Week: 0 days    Minutes of Exercise per Session: 0 min  Stress: Not on file  Social Connections: Not on file  Intimate Partner Violence: Not on file    FAMILY HISTORY: Family History  Problem Relation Age of Onset   Ovarian cysts Mother    Migraines Mother    Melanoma Mother        on leg   Hyperlipidemia Father    Diabetes Maternal Aunt    Stomach cancer Paternal Aunt  Diabetes Maternal Grandfather    Lymphoma Cousin 17   Breast cancer Neg Hx     ALLERGIES:  is allergic to metformin and  oxycodone .  MEDICATIONS:  Current Outpatient Medications  Medication Sig Dispense Refill   acetaminophen  (TYLENOL ) 325 MG tablet Take 2 tablets (650 mg total) by mouth every 6 (six) hours as needed for mild pain (or Fever >/= 101).     ascorbic acid  (VITAMIN C) 500 MG tablet Take 1 tablet (500 mg total) by mouth daily. 30 tablet    calcium -vitamin D  (OSCAL WITH D) 500-5 MG-MCG tablet Take 1 tablet by mouth 2 (two) times daily. (Patient taking differently: Take 1 tablet by mouth daily with breakfast.) 60 tablet 2   Dapagliflozin Propanediol (FARXIGA PO) Take 10 mg by mouth daily.     lidocaine -prilocaine  (EMLA ) cream Apply on the port. 30 -45 min  prior to port access. 30 g 3   loperamide  (IMODIUM ) 2 MG capsule Take 2 capsules (4 mg total) by mouth 4 (four) times daily as needed for diarrhea or loose stools. (MAX 16 mg/day) 240 capsule 1   Neratinib Maleate (NERLYNX) 40 MG tablet Take 6 tablets (240 mg total) by mouth daily. Take with food. 180 tablet 0   ondansetron  (ZOFRAN ) 4 MG tablet Take 1 tablet (4 mg total) by mouth every 8 (eight) hours as needed for up to 20 doses for nausea or vomiting. 20 tablet 0   ondansetron  (ZOFRAN -ODT) 8 MG disintegrating tablet Take 8 mg by mouth every 8 (eight) hours as needed for nausea or vomiting.     ONETOUCH VERIO test strip SMARTSIG:1 Each Via Meter Daily PRN     pioglitazone (ACTOS) 15 MG tablet Take 15 mg by mouth daily.     potassium chloride  (KLOR-CON ) 20 MEQ packet TAKE 20 MEQ BY MOUTH 2 (TWO) TIMES DAILY. 180 packet 1   prochlorperazine  (COMPAZINE ) 10 MG tablet Take 1 tablet (10 mg total) by mouth every 6 (six) hours as needed for nausea or vomiting. 40 tablet 1   tamoxifen  (NOLVADEX ) 20 MG tablet TAKE 1 TABLET BY MOUTH EVERY DAY 90 tablet 1   No current facility-administered medications for this visit.   Facility-Administered Medications Ordered in Other Visits  Medication Dose Route Frequency Provider Last Rate Last Admin   sodium chloride  flush  (NS) 0.9 % injection 10 mL  10 mL Intravenous PRN Lonnel Gjerde R, MD   10 mL at 08/17/23 1525      .  PHYSICAL EXAMINATION: ECOG PERFORMANCE STATUS: 0 - Asymptomatic  Vitals:   04/26/24 0821  BP: 125/69  Pulse: 69  Resp: 16  Temp: (!) 96.9 F (36.1 C)  SpO2: 100%      Filed Weights   04/26/24 0821  Weight: 233 lb (105.7 kg)      Physical Exam Vitals and nursing note reviewed.  HENT:     Head: Normocephalic and atraumatic.     Mouth/Throat:     Pharynx: Oropharynx is clear.  Eyes:     Extraocular Movements: Extraocular movements intact.     Pupils: Pupils are equal, round, and reactive to light.  Cardiovascular:     Rate and Rhythm: Normal rate and regular rhythm.  Pulmonary:     Comments: Decreased breath sounds bilaterally.  Abdominal:     Palpations: Abdomen is soft.  Musculoskeletal:        General: Normal range of motion.     Cervical back: Normal range of motion.  Skin:    General:  Skin is warm.  Neurological:     General: No focal deficit present.     Mental Status: She is alert and oriented to person, place, and time.  Psychiatric:        Behavior: Behavior normal.        Judgment: Judgment normal.      LABORATORY DATA:  I have reviewed the data as listed Lab Results  Component Value Date   WBC 5.5 04/26/2024   HGB 13.2 04/26/2024   HCT 39.4 04/26/2024   MCV 85.5 04/26/2024   PLT 143 (L) 04/26/2024   Recent Labs    02/22/24 1412 04/02/24 1237 04/26/24 0812  NA 137 138 142  K 3.2* 3.2* 4.4  CL 104 106 107  CO2 24 24 27   GLUCOSE 184* 152* 118*  BUN 13 14 13   CREATININE 0.73 0.71 0.83  CALCIUM  9.0 8.3* 8.7*  GFRNONAA >60 >60 >60  PROT 6.6 6.4* 6.5  ALBUMIN 3.3* 3.1* 3.3*  AST 32 25 28  ALT 21 24 25   ALKPHOS 57 52 54  BILITOT 0.7 0.5 0.5    RADIOGRAPHIC STUDIES: I have personally reviewed the radiological images as listed and agreed with the findings in the report. No results found.   ASSESSMENT & PLAN:    Carcinoma of upper-outer quadrant of right breast in female, estrogen receptor positive (HCC) # MAY 2023- STAGE III-T4N1 ER/PR positive HER2 POSITIVE RIGHT breast cancer; LN-positive-ER/PR positive her-2 NEGATIVE; # s/p neoadjuvant chemotherapy- TCH plus P x 6 cycles- s/p Right mastectomy-[15th, NOV 2023] ypT2 [6mm]; ypN0.  Given partial response/involvement of the breast skin/multifocal disease-postmastectomy s/p RT- 08/27/2022. Patient currently on tamoxifen  [DEC-JAN 2024]; + OFS- Lupron  every 3 months ]. S/p  Ado-trastuzumab emtansine  x 14 cycles.  # JUNE 2nd, 2025- Stable examination without evidence of new or progressive disease in the chest, abdomen or pelvis;  Stable prominent mediastinal, mesenteric and retroperitoneal lymph nodes; Stable scattered pulmonary nodules. Monitor for now- will plan imaging in 4-6 months.   # Patient currently on adjuvant neratinib  240 mg /day [ x10 days on 6 pills a day]-tolerating poorly with ongoing diarrhea/mucositis/abdominal cramping-recommend going down to 4 pills a day.  Will reevaluate in couple of weeks-and then ramp up with prophylaxis-loperamide /budesonide  # Oral mucositis-G-1-2-baking soda and salt water  rinses.  # Diarrhea/cramping- sec to neratinib  6 pills-decrease the dose to 4 pills a day.  Continue antidiarrheal.  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 # [facility] re: right knee pain- stable.  # Mild Thrombocytopenia- >100  monitor for now. stable.  # Hypocalcemia-  ON adjuvant Zometa  q 6 months x 3 years [first- May-17th, 2024].   Continue ca+Vit D BID. Vit D FEB 2024- 54; [albumin 3.0]-  Stable.  # Infusion reaction - ZOmeta  [flu like symtoms]- imprived with NSAIDs-   Stable.  # Diabetes- BG 118 [fasting]- on  Farxiga 10 mg- [OFF ozempic/metformin- intolerance]; on Farxiga-noted to have weight gain.  Defer to PCP-   # Hypokalemia: continue Kdur powder daily-  stable.  # PV access: port functional- # port/IV access-  flush  q 6- 8 weeks for now.   Lupron  02/22/2024  q 67M; Zometa  [over 30 mins] q 6 M- #1 10/22/22   # DISPOSITION: # follow up with MD- in 2 weeks-/Friday-  labs- cbc/cmp; mag- # Keep dec appts as planned-   Dr.B    All questions were answered. The patient/family knows to call the clinic with any problems, questions or concerns.  Cindy JONELLE Joe, MD 04/26/2024 9:02 AM

## 2024-04-25 NOTE — Assessment & Plan Note (Addendum)
#   MAY 2023- STAGE III-T4N1 ER/PR positive HER2 POSITIVE RIGHT breast cancer; LN-positive-ER/PR positive her-2 NEGATIVE; # s/p neoadjuvant chemotherapy- TCH plus P x 6 cycles- s/p Right mastectomy-[15th, NOV 2023] ypT2 [30mm]; ypN0.  Given partial response/involvement of the breast skin/multifocal disease-postmastectomy s/p RT- 08/27/2022. Patient currently on tamoxifen  [DEC-JAN 2024]; + OFS- Lupron  every 3 months ]. S/p  Ado-trastuzumab emtansine  x 14 cycles.  # JUNE 2nd, 2025- Stable examination without evidence of new or progressive disease in the chest, abdomen or pelvis;  Stable prominent mediastinal, mesenteric and retroperitoneal lymph nodes; Stable scattered pulmonary nodules. Monitor for now- will plan imaging in 4-6 months.   # Patient currently on adjuvant neratinib.   # I discussed with the patient regarding-given her high risk disease at baseline and also given the lack of complete pathologic response status post neoadjuvant chemotherapy her overall risk of recurrence is about 20 to 30%.  Given the absence of any prospective clinical trials of the use of neratinib in absence post TDM 1 therapy-I think it still reasonable to consider neratinib adjuvant for total of 1 year especially as she is ER/PR positive.  # Discussed the potential risk of diarrhea, and skin rash and other GI side effects with neratinib.  However discussed the use of stepup approach would recommend every 2 weeks rather than every week step up.  Discussed with Isaiah from pharmacy.  Patient could potentially get started for step up dosing on 13 November.   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 # [facility] re: right knee pain   # CT MARCH 2025- incidental- Fatty liver infiltration with mild splenomegaly and varices/ rule out  chronic liver disease- - s/p KC- GI evaluation-  MAY 2025- EGD-negative/mild gastritis-ultrasound suggestive of fatty liver- stable.   # Mild Thrombocytopenia- >100  monitor for now.  Stable.   # Hypocalcemia-  ON adjuvant Zometa  q 6 months x 3 years [first- May-17th, 2024].   Continue ca+Vit D BID. Vit D FEB 2024- 54; [albumin 3.0]-  Stable.  # Right sided UE swelling/tightness- s/p  Maureen-  Stable.  # Infusion reaction - ZOmeta  [flu like symtoms]- imprived with NSAIDs-   Stable.  # Diabetes- BG 168 [post lunch]- on  Farxiga 10 mg- [OFF ozempic/metformin- intolerance]; on Farxiga-noted to have weight gain.  Defer to PCP-   # Hypokalemia: continue Kdur powder daily-  stable.  # PV access: port functional- # port/IV access-  flush q 6- 8 weeks for now.   Lupron  05/13/2023  q 56M; Zometa  [over 30 mins] q 6 M- #1 10/22/22   # DISPOSITION: # follow up with APP in 3rd week of OCT 27th -APP- labs- cbc/cmp; mag-  # follow up with MD- NOV 17th week-  labs- cbc/cmp; mag-  # keep other appts as planned-  - Dr.B

## 2024-04-26 ENCOUNTER — Other Ambulatory Visit

## 2024-04-26 ENCOUNTER — Inpatient Hospital Stay

## 2024-04-26 ENCOUNTER — Encounter: Payer: Self-pay | Admitting: Internal Medicine

## 2024-04-26 ENCOUNTER — Inpatient Hospital Stay: Attending: Internal Medicine | Admitting: Internal Medicine

## 2024-04-26 VITALS — BP 125/69 | HR 69 | Temp 96.9°F | Resp 16 | Ht 66.0 in | Wt 233.0 lb

## 2024-04-26 DIAGNOSIS — Z170 Estrogen receptor positive status [ER+]: Secondary | ICD-10-CM

## 2024-04-26 DIAGNOSIS — C50411 Malignant neoplasm of upper-outer quadrant of right female breast: Secondary | ICD-10-CM | POA: Diagnosis not present

## 2024-04-26 LAB — CMP (CANCER CENTER ONLY)
ALT: 25 U/L (ref 0–44)
AST: 28 U/L (ref 15–41)
Albumin: 3.3 g/dL — ABNORMAL LOW (ref 3.5–5.0)
Alkaline Phosphatase: 54 U/L (ref 38–126)
Anion gap: 8 (ref 5–15)
BUN: 13 mg/dL (ref 6–20)
CO2: 27 mmol/L (ref 22–32)
Calcium: 8.7 mg/dL — ABNORMAL LOW (ref 8.9–10.3)
Chloride: 107 mmol/L (ref 98–111)
Creatinine: 0.83 mg/dL (ref 0.44–1.00)
GFR, Estimated: >60 mL/min (ref >60)
Glucose, Bld: 118 mg/dL — ABNORMAL HIGH (ref 70–99)
Potassium: 4.4 mmol/L (ref 3.5–5.1)
Sodium: 142 mmol/L (ref 135–145)
Total Bilirubin: 0.5 mg/dL (ref 0.0–1.2)
Total Protein: 6.5 g/dL (ref 6.5–8.1)

## 2024-04-26 LAB — CBC WITH DIFFERENTIAL (CANCER CENTER ONLY)
Abs Immature Granulocytes: 0.03 K/uL (ref 0.00–0.07)
Basophils Absolute: 0 K/uL (ref 0.0–0.1)
Basophils Relative: 1 %
Eosinophils Absolute: 0.2 K/uL (ref 0.0–0.5)
Eosinophils Relative: 3 %
HCT: 39.4 % (ref 36.0–46.0)
Hemoglobin: 13.2 g/dL (ref 12.0–15.0)
Immature Granulocytes: 1 %
Lymphocytes Relative: 22 %
Lymphs Abs: 1.2 K/uL (ref 0.7–4.0)
MCH: 28.6 pg (ref 26.0–34.0)
MCHC: 33.5 g/dL (ref 30.0–36.0)
MCV: 85.5 fL (ref 80.0–100.0)
Monocytes Absolute: 0.4 K/uL (ref 0.1–1.0)
Monocytes Relative: 7 %
Neutro Abs: 3.6 K/uL (ref 1.7–7.7)
Neutrophils Relative %: 66 %
Platelet Count: 143 K/uL — ABNORMAL LOW (ref 150–400)
RBC: 4.61 MIL/uL (ref 3.87–5.11)
RDW: 13.1 % (ref 11.5–15.5)
WBC Count: 5.5 K/uL (ref 4.0–10.5)
nRBC: 0 % (ref 0.0–0.2)

## 2024-04-26 LAB — MAGNESIUM: Magnesium: 2.1 mg/dL (ref 1.7–2.4)

## 2024-04-26 NOTE — Progress Notes (Signed)
 Once she started 6 pills, she got nauseous, stomach pain, taking imodium . Has had some on going fatigue. She had a sore on her tongue at the beginning of the 6 pills. Burns her tongue.

## 2024-04-27 ENCOUNTER — Other Ambulatory Visit: Payer: Self-pay | Admitting: Pharmacist

## 2024-04-27 DIAGNOSIS — C50411 Malignant neoplasm of upper-outer quadrant of right female breast: Secondary | ICD-10-CM

## 2024-04-27 MED ORDER — NERATINIB MALEATE 40 MG PO TABS: 160.0000 mg | ORAL_TABLET | Freq: Every day | ORAL | 0 refills | Status: DC

## 2024-05-02 ENCOUNTER — Telehealth: Admitting: Physician Assistant

## 2024-05-02 DIAGNOSIS — J069 Acute upper respiratory infection, unspecified: Secondary | ICD-10-CM | POA: Diagnosis not present

## 2024-05-02 MED ORDER — ALBUTEROL SULFATE HFA 108 (90 BASE) MCG/ACT IN AERS: 2.0000 | INHALATION_SPRAY | Freq: Four times a day (QID) | RESPIRATORY_TRACT | 0 refills | Status: AC | PRN

## 2024-05-02 MED ORDER — BENZONATATE 100 MG PO CAPS: 100.0000 mg | ORAL_CAPSULE | Freq: Three times a day (TID) | ORAL | 0 refills | Status: AC | PRN

## 2024-05-02 NOTE — Progress Notes (Signed)

## 2024-05-08 ENCOUNTER — Inpatient Hospital Stay: Attending: Internal Medicine

## 2024-05-08 ENCOUNTER — Encounter: Payer: Self-pay | Admitting: Internal Medicine

## 2024-05-08 ENCOUNTER — Inpatient Hospital Stay: Admitting: Internal Medicine

## 2024-05-08 VITALS — BP 121/46 | HR 85 | Temp 97.1°F | Resp 16 | Ht 66.0 in | Wt 231.3 lb

## 2024-05-08 DIAGNOSIS — J209 Acute bronchitis, unspecified: Secondary | ICD-10-CM | POA: Insufficient documentation

## 2024-05-08 DIAGNOSIS — Z23 Encounter for immunization: Secondary | ICD-10-CM | POA: Insufficient documentation

## 2024-05-08 DIAGNOSIS — E119 Type 2 diabetes mellitus without complications: Secondary | ICD-10-CM | POA: Insufficient documentation

## 2024-05-08 DIAGNOSIS — C50411 Malignant neoplasm of upper-outer quadrant of right female breast: Secondary | ICD-10-CM

## 2024-05-08 DIAGNOSIS — Z5111 Encounter for antineoplastic chemotherapy: Secondary | ICD-10-CM | POA: Insufficient documentation

## 2024-05-08 DIAGNOSIS — Z170 Estrogen receptor positive status [ER+]: Secondary | ICD-10-CM | POA: Insufficient documentation

## 2024-05-08 DIAGNOSIS — Z79810 Long term (current) use of selective estrogen receptor modulators (SERMs): Secondary | ICD-10-CM | POA: Insufficient documentation

## 2024-05-08 DIAGNOSIS — E876 Hypokalemia: Secondary | ICD-10-CM | POA: Insufficient documentation

## 2024-05-08 DIAGNOSIS — R635 Abnormal weight gain: Secondary | ICD-10-CM | POA: Insufficient documentation

## 2024-05-08 LAB — CBC WITH DIFFERENTIAL (CANCER CENTER ONLY)
Abs Immature Granulocytes: 0.03 K/uL (ref 0.00–0.07)
Basophils Absolute: 0 K/uL (ref 0.0–0.1)
Basophils Relative: 1 %
Eosinophils Absolute: 0.2 K/uL (ref 0.0–0.5)
Eosinophils Relative: 4 %
HCT: 38.3 % (ref 36.0–46.0)
Hemoglobin: 12.9 g/dL (ref 12.0–15.0)
Immature Granulocytes: 1 %
Lymphocytes Relative: 24 %
Lymphs Abs: 1.4 K/uL (ref 0.7–4.0)
MCH: 28.4 pg (ref 26.0–34.0)
MCHC: 33.7 g/dL (ref 30.0–36.0)
MCV: 84.4 fL (ref 80.0–100.0)
Monocytes Absolute: 0.3 K/uL (ref 0.1–1.0)
Monocytes Relative: 5 %
Neutro Abs: 3.9 K/uL (ref 1.7–7.7)
Neutrophils Relative %: 65 %
Platelet Count: 175 K/uL (ref 150–400)
RBC: 4.54 MIL/uL (ref 3.87–5.11)
RDW: 13.4 % (ref 11.5–15.5)
WBC Count: 5.9 K/uL (ref 4.0–10.5)
nRBC: 0 % (ref 0.0–0.2)

## 2024-05-08 LAB — CMP (CANCER CENTER ONLY)
ALT: 29 U/L (ref 0–44)
AST: 34 U/L (ref 15–41)
Albumin: 3.7 g/dL (ref 3.5–5.0)
Alkaline Phosphatase: 59 U/L (ref 38–126)
Anion gap: 9 (ref 5–15)
BUN: 13 mg/dL (ref 6–20)
CO2: 26 mmol/L (ref 22–32)
Calcium: 9.1 mg/dL (ref 8.9–10.3)
Chloride: 107 mmol/L (ref 98–111)
Creatinine: 0.76 mg/dL (ref 0.44–1.00)
GFR, Estimated: >60 mL/min (ref >60)
Glucose, Bld: 104 mg/dL — ABNORMAL HIGH (ref 70–99)
Potassium: 3.8 mmol/L (ref 3.5–5.1)
Sodium: 141 mmol/L (ref 135–145)
Total Bilirubin: 0.3 mg/dL (ref 0.0–1.2)
Total Protein: 6.8 g/dL (ref 6.5–8.1)

## 2024-05-08 LAB — MAGNESIUM: Magnesium: 2 mg/dL (ref 1.7–2.4)

## 2024-05-08 NOTE — Assessment & Plan Note (Addendum)
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  Given partial response/involvement of the breast skin/multifocal disease-postmastectomy s/p RT- 08/27/2022. Patient currently on tamoxifen  [DEC-JAN 2024]; + OFS- Lupron  every 3 months ]. S/p  Ado-trastuzumab emtansine  x 14 cycles.  # JUNE 2nd, 2025- Stable examination without evidence of new or progressive disease in the chest, abdomen or pelvis;  Stable prominent mediastinal, mesenteric and retroperitoneal lymph nodes; Stable scattered pulmonary nodules. Monitor for now- will plan imaging in 4-6 months.   # Patient currently on adjuvant neratinib  160 mg /day tolerating with significant improvement of diarrhea/mucositis/abdominal cramping.  Continue 4 pills a day.  # acute bronchitis- no fevers- on tessalon  pearls/ and albutreol prn   # Right knee pain-likely secondary arthritis MSK. # [facility] re: right knee pain- stable.  # Hypocalcemia-  ON adjuvant Zometa  q 6 months x 3 years [first- May-17th, 2024].   Continue ca+Vit D BID. Vit D FEB 2024- 54; [albumin 3.0]-  Stable.  # Infusion reaction - ZOmeta  [flu like symtoms]- imprived with NSAIDs-   Stable.  # Diabetes- BG 118 [fasting]- on  Farxiga 10 mg- [OFF ozempic/metformin- intolerance]; on Farxiga-noted to have weight gain.  Defer to PCP-   # Hypokalemia: continue Kdur powder daily-  stable.  # PV access: port functional- # port/IV access-  flush q 6- 8 weeks for now.   Lupron  02/22/2024  q 66M; Zometa  [over 30 mins] q 6 M- #1 10/22/22   # DISPOSITION: # Keep dec appts as planned-   Dr.B

## 2024-05-08 NOTE — Progress Notes (Signed)
 Pt states her symptoms have improved since the dose adjustment of the nerlynx  to 160 mg every day.

## 2024-05-08 NOTE — Progress Notes (Signed)
 [facility] CONSULT NOTE  Patient Care Team: [name], NP as PCP - General (Family Medicine) [name], RN as Oncology Nurse Navigator [name], [name]  CHIEF COMPLAINTS/PURPOSE OF CONSULTATION: Breast cancer  Oncology History Overview Note  MAY 2023-    Targeted ultrasound is performed, showing a 2.9 x 1.9 x 2.0 cm irregular hypoechoic mass right breast 10 o'clock position 6 cm from nipple at the site of palpable concern.   There is an abnormal 8 mm thickened right axillary lymph node.   There is skin thickening overlying the right breast. ------------------  A. BREAST, RIGHT AT 10:00, 6 CM FROM THE NIPPLE; ULTRASOUND-GUIDED CORE  NEEDLE BIOPSY:  - INVASIVE MAMMARY CARCINOMA, NO SPECIAL TYPE.   Size of invasive carcinoma: 11 mm in this sample  Histologic grade of invasive carcinoma: Grade 2                       Glandular/tubular differentiation score: 3                       Nuclear pleomorphism score: 2                       Mitotic rate score: 1                       Total score: 6  Ductal carcinoma in situ: Present, intermediate grade  Lymphovascular invasion: Not identified   B. LYMPH NODE, RIGHT AXILLARY; ULTRASOUND-GUIDED CORE NEEDLE BIOPSY:  - 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 IN GREATEST  EXTENT.   Comment:  The malignancy in the primary breast lesion appears morphologically  different from tumor in the lymph node sampling, with tumor present in  lymph node more suggestive of a histologic grade 3, with significantly  increased mitotic activity and pleomorphism. Due to the discrepancy in  these morphologic patterns, ER/PR/HER2 immunohistochemistry will be  performed on both blocks A1 and B1, with reflex to FISH for HER2 2+. The  results will be reported in an addendum.   ADDENDUM:  CASE SUMMARY: BREAST BIOMARKER TESTS - A - BREAST, RIGHT AT 10:00  Estrogen Receptor  (ER) Status: POSITIVE          Percentage of cells with nuclear positivity: 71-80%          Average intensity of staining: Strong   Progesterone Receptor (PgR) Status: POSITIVE          Percentage of cells with nuclear positivity: 81-90%          Average intensity of staining: Strong   HER2 (by immunohistochemistry): POSITIVE (Score 3+)       Percentage of cells with uniform intense complete membrane  staining: 61-70%  HER2 displays a heterogeneous staining pattern, with areas showing a  strong 3+ staining pattern, and other regions with complete absence (0+)  of staining.   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  Discussed with Dr. Mabeline tumor heterogeneity. BREAST MRI- JUNE 2023- The patient's known malignancy in the upper outer quadrant of the right breast measures 3.8 x 2.8 x 3.6 cm. Numerous other suspicious masses are identified in the right breast as above involving the upper outer and upper inner quadrants, located both anterior and posterior to the known malignancy.  Numerous enhancing foci in the skin as above are worrisome for skin metastases ; Bx proven. Biopsy proven metastatic node in the right axilla.  No evidence of malignancy in the left breast;  Two mildly prominent right internal mammary nodes were not FDG avid on recent PET-CT imaging.  Currently on neoadjuvant chemotherapy- TCH  plus P x6 cycles;  MUGA scan May 31st, 2023- -61%.    # June 7th, 2023- NEO-ADJUVANT CHEMO- TCH+P x6 cycles- s/p Mastectomy [NOV 15th, 2023] [Dr.Byrnett]-  ypT2 [57mm]; ypN0.  # JAN mid, 2024 -TAMOXFEN 20 mg a day; ADD LupronFEB 2nd.2 2024 s/p RT maech, 22nd- 2024  # FEB 2nd, 2023- Kadcyla  q 3 W x14 cycles- finished dec 2024.   # OCT 17th, 2025- start NERATINIB  step up dose. November 20th-2025-decrease to 160 mg / 4 pills a day [diarrhea mucositis abdominal cramping]  # Lupron  q3 M [/start 2/07-2022]   # Genetic counseling s/p- NEG for any deleterious mutations.     Carcinoma of upper-outer quadrant of right breast in female, estrogen receptor positive (HCC)  10/26/2021 Initial Diagnosis   Carcinoma of upper-outer quadrant of right breast in female, estrogen receptor positive (HCC)   10/26/2021 Cancer Staging   Staging form: Breast, AJCC 8th Edition - Clinical: Stage IIIA (cT4d, cN1, cM0, G2, ER+, PR+, HER2+) - Signed by [name], MD on 06/04/2022 Histologic grading system: 3 grade system   11/10/2021 - 03/01/2022 Chemotherapy   Patient is on Treatment Plan : BREAST  Docetaxel  + Carboplatin  + Trastuzumab  + Pertuzumab   (TCHP) q21d      11/11/2021 - 01/13/2022 Chemotherapy   Patient is on Treatment Plan : BREAST  Docetaxel  + Carboplatin  + Trastuzumab  + Pertuzumab   (TCHP) q21d       Genetic Testing   Negative genetic testing. No pathogenic variants identified on the [facility] CancerNext-Expanded+RNA panel. The report date is 11/29/2021.  The CancerNext-Expanded + RNAinsight gene panel offered by [employer] and includes sequencing and rearrangement analysis for the following 77 genes: IP, ALK, APC*, ATM*, AXIN2, BAP1, BARD1, BLM, BMPR1A, BRCA1*, BRCA2*, BRIP1*, CDC73, CDH1*,CDK4, CDKN1B, CDKN2A, CHEK2*, CTNNA1, DICER1, FANCC, FH, FLCN, GALNT12, KIF1B, LZTR1, MAX, MEN1, MET, MLH1*, MSH2*, MSH3, MSH6*, MUTYH*, NBN, NF1*, NF2, NTHL1, PALB2*, PHOX2B, PMS2*, POT1, PRKAR1A, PTCH1, PTEN*,  RAD51C*, RAD51D*,RB1, RECQL, RET, SDHA, SDHAF2, SDHB, SDHC, SDHD, SMAD4, SMARCA4, SMARCB1, SMARCE1, STK11, SUFU, TMEM127, TP53*,TSC1, TSC2, VHL and XRCC2 (sequencing and deletion/duplication); EGFR, EGLN1, HOXB13, KIT, MITF, PDGFRA, POLD1 and POLE (sequencing only); EPCAM and GREM1 (deletion/duplication only).   07/09/2022 -  Chemotherapy   Patient is on Treatment Plan : BREAST ADO-Trastuzumab Emtansine  (Kadcyla ) q21d      HISTORY OF PRESENTING ILLNESS: Patient is ambulating independently.  Alone,   Marilyn Page 48 y.o.  female right breast TRIPLE POSITIVE with T4- Stage III -s/p mastectomy-currently on adjuvant tamoxifen  + OFS + Neratinib   is here for follow-up.   Discussed the  use of AI scribe software for clinical note transcription with the patient, who gave verbal consent to proceed.   History of Present Illness   Marilyn Page is a 48 year old female with triple positive stage three breast cancer who presents for follow-up on adjuvant therapy.  She is currently on adjuvant therapy with tamoxifen  and neratinib . She has reduced her neratinib  dose from six pills to four pills daily due to side effects, and she is tolerating the lower dose well. Previously, she experienced soreness in the mouth and diarrhea, but these symptoms have resolved. She had an episode of diarrhea after consuming rich foods around Thanksgiving, for which she took Lamotil or Imodium  once.  She has been experiencing a cough for the past week, which has improved slightly. She feels congested and has been using an inhaler and Benzonatate  for relief. She had a low-grade fever of 99-100F briefly last Wednesday.  No current diarrhea or abdominal cramping. She reports a cough and congestion and no history of asthma.     Marilyn Page is a 48 year old female with triple positive stage three breast cancer who presents for follow-up on adjuvant therapy.  She is currently on adjuvant therapy with tamoxifen  and neratinib . She  has reduced her neratinib  dose from six pills to four pills daily due to side effects, and she is tolerating the lower dose well. Previously, she experienced soreness in the mouth and diarrhea, but these symptoms have resolved. She had an episode of diarrhea after consuming rich foods around Thanksgiving, for which she took Lamotil or Imodium  once.  She has been experiencing a cough for the past week, which has improved slightly. She feels congested and has been using an inhaler and Benzonatate  for relief. She had a low-grade fever of 99-100F briefly last Wednesday.  No current diarrhea or abdominal cramping. She reports a cough and congestion and no history of asthma.      Review of Systems  Constitutional:  Positive for malaise/fatigue. Negative for chills, diaphoresis, fever and weight loss.  HENT:  Negative for nosebleeds and sore throat.   Eyes:  Negative for double vision.  Respiratory:  Negative for cough, hemoptysis, sputum production, shortness of breath and wheezing.   Cardiovascular:  Negative for chest pain, palpitations, orthopnea and leg swelling.  Gastrointestinal:  Negative for abdominal pain, blood in stool, constipation, diarrhea, heartburn, melena, nausea and vomiting.  Genitourinary:  Negative for dysuria, frequency and urgency.  Musculoskeletal:  Positive for joint pain and myalgias. Negative for back pain.  Skin: Negative.  Negative for itching and rash.  Neurological:  Negative for dizziness, tingling, focal weakness, weakness and headaches.  Endo/Heme/Allergies:  Does not bruise/bleed easily.  Psychiatric/Behavioral:  Negative for depression. The patient is not nervous/anxious and does not have insomnia.      MEDICAL HISTORY:  Past Medical History:  Diagnosis Date   Anemia    Carcinoma of upper-outer quadrant of right breast in female, estrogen receptor positive (HCC) 10/26/2021   Diabetes mellitus without complication (HCC)    Family history of adverse reaction  to anesthesia    grandmother had issues but doesnt know what the issue was, she was older.   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 Axilla Butterfly hydro clip-MACRO METASTATIC MAMMARY CARCINOMA   BREAST BIOPSY Right 04/21/2022   US  RT PLC BREAST LOC DEV   1ST LESION  INC US  GUIDE 04/21/2022 [facility]   BREAST RECONSTRUCTION WITH PLACEMENT OF TISSUE EXPANDER AND FLEX HD (ACELLULAR HYDRATED DERMIS) Right 04/21/2022   Procedure: RIGHT BREAST RECONSTRUCTION WITH PLACEMENT OF TISSUE EXPANDER AND FLEX HD (ACELLULAR HYDRATED DERMIS);  Surgeon: Lowery Estefana RAMAN, DO;  Location: [facility];  Service: Plastics;  Laterality: Right;   CESAREAN SECTION  2005/2008   DILATION AND CURETTAGE OF UTERUS  2003   ESOPHAGOGASTRODUODENOSCOPY N/A 11/02/2023   Procedure: EGD (ESOPHAGOGASTRODUODENOSCOPY);  Surgeon: Toledo, Ladell POUR, MD;  Location: [facility];  Service: Gastroenterology;  Laterality: N/A;   MASTECTOMY Right 04/2023   PORTACATH PLACEMENT Left 11/09/2021   Procedure: INSERTION PORT-A-CATH;  Surgeon: Dessa Reyes ORN, MD;  Location: [facility];  Service: General;  Laterality: Left;   REMOVAL OF TISSUE EXPANDER AND PLACEMENT OF IMPLANT Right 06/08/2022   Procedure: REMOVAL OF TISSUE EXPANDER;  Surgeon: Lowery Estefana RAMAN, DO;  Location: [facility];  Service: Plastics;  Laterality: Right;   SIMPLE MASTECTOMY WITH AXILLARY SENTINEL NODE BIOPSY Right 04/21/2022   Procedure: SIMPLE MASTECTOMY WITH AXILLARY SENTINEL NODE BIOPSY;  Surgeon: Dessa Reyes ORN, MD;  Location: [facility];  Service: General;  Laterality: Right;  wire localization of  lymph node   SKIN BIOPSY Right 11/09/2021   Procedure: PUNCH BIOPSY SKIN, TATTOO LYMPH NODE RIGHT AXILLA;  Surgeon: Dessa Reyes ORN, MD;  Location: [facility];  Service: General;  Laterality: Right;   WISDOM TOOTH EXTRACTION      SOCIAL HISTORY: Social History   Socioeconomic History   Marital status: Married    Spouse name: Bonnie   Number of children: 2   Years of education: Not on file   Highest education level: Not on file  Occupational History   Occupation: and architectural technologist  Tobacco Use   Smoking status: Never   Smokeless tobacco: Never  Vaping Use   Vaping status: Never Used  Substance and Sexual Activity   Alcohol use: Never   Drug use: Never   Sexual activity: Yes    Birth control/protection: Other-see comments, Surgical    Comment: vasectomy  Other Topics Concern   Not on file  Social History Narrative   Pre-school teacher; in [place]; 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    Social Drivers of Corporate Investment Banker Strain: Low Risk  (01/02/2024)   Received from [facility]   Overall Financial Resource Strain (CARDIA)    Difficulty of Paying Living Expenses: Not hard at all  Food Insecurity: No Food Insecurity (01/02/2024)   Received from [facility]   Hunger Vital Sign    Within the past 12 months, you worried that your food would run out before you got the money to buy more.: Never true    Within the past 12 months, the food you bought just didn't last and you didn't have money to get more.: Never true  Transportation Needs: No Transportation Needs (01/02/2024)   Received from [facility] - Transportation    In the past 12 months, has lack of transportation kept you from medical appointments or from getting medications?: No    Lack of Transportation (Non-Medical): No  Physical Activity: Inactive (09/26/2017)   Exercise Vital Sign  Days of Exercise per Week: 0 days    Minutes of Exercise per  Session: 0 min  Stress: Not on file  Social Connections: Not on file  Intimate Partner Violence: Not on file    FAMILY HISTORY: Family History  Problem Relation Age of Onset   Ovarian cysts Mother    Migraines Mother    Melanoma Mother        on leg   Hyperlipidemia Father    Diabetes Maternal Aunt    Stomach cancer Paternal Aunt    Diabetes Maternal Grandfather    Lymphoma Cousin 17   Breast cancer Neg Hx     ALLERGIES:  is allergic to metformin and oxycodone .  MEDICATIONS:  Current Outpatient Medications  Medication Sig Dispense Refill   acetaminophen  (TYLENOL ) 325 MG tablet Take 2 tablets (650 mg total) by mouth every 6 (six) hours as needed for mild pain (or Fever >/= 101).     albuterol  (VENTOLIN  HFA) 108 (90 Base) MCG/ACT inhaler Inhale 2 puffs into the lungs every 6 (six) hours as needed for wheezing or shortness of breath. 8 g 0   ascorbic acid  (VITAMIN C) 500 MG tablet Take 1 tablet (500 mg total) by mouth daily. 30 tablet    benzonatate  (TESSALON ) 100 MG capsule Take 1 capsule (100 mg total) by mouth 3 (three) times daily as needed for cough. 30 capsule 0   calcium -vitamin D  (OSCAL WITH D) 500-5 MG-MCG tablet Take 1 tablet by mouth 2 (two) times daily. (Patient taking differently: Take 1 tablet by mouth daily with breakfast.) 60 tablet 2   Dapagliflozin Propanediol (FARXIGA PO) Take 10 mg by mouth daily.     lidocaine -prilocaine  (EMLA ) cream Apply on the port. 30 -45 min  prior to port access. 30 g 3   loperamide  (IMODIUM ) 2 MG capsule Take 2 capsules (4 mg total) by mouth 4 (four) times daily as needed for diarrhea or loose stools. (MAX 16 mg/day) 240 capsule 1   Neratinib  Maleate (NERLYNX ) 40 MG tablet Take 4 tablets (160 mg total) by mouth daily. Take with food. 120 tablet 0   ondansetron  (ZOFRAN ) 4 MG tablet Take 1 tablet (4 mg total) by mouth every 8 (eight) hours as needed for up to 20 doses for nausea or vomiting. 20 tablet 0   ondansetron  (ZOFRAN -ODT) 8 MG  disintegrating tablet Take 8 mg by mouth every 8 (eight) hours as needed for nausea or vomiting.     ONETOUCH VERIO test strip SMARTSIG:1 Each Via Meter Daily PRN     pioglitazone (ACTOS) 15 MG tablet Take 15 mg by mouth daily.     potassium chloride  (KLOR-CON ) 20 MEQ packet TAKE 20 MEQ BY MOUTH 2 (TWO) TIMES DAILY. 180 packet 1   prochlorperazine  (COMPAZINE ) 10 MG tablet Take 1 tablet (10 mg total) by mouth every 6 (six) hours as needed for nausea or vomiting. 40 tablet 1   tamoxifen  (NOLVADEX ) 20 MG tablet TAKE 1 TABLET BY MOUTH EVERY DAY 90 tablet 1   No current facility-administered medications for this visit.   Facility-Administered Medications Ordered in Other Visits  Medication Dose Route Frequency Provider Last Rate Last Admin   sodium chloride  flush (NS) 0.9 % injection 10 mL  10 mL Intravenous PRN Camdon Saetern R, MD   10 mL at 08/17/23 1525      .  PHYSICAL EXAMINATION: ECOG PERFORMANCE STATUS: 0 - Asymptomatic  Vitals:   05/08/24 1249  BP: (!) 121/46  Pulse: 85  Resp: 16  Temp: (!) 97.1 F (36.2 C)  SpO2: 98%      Filed Weights   05/08/24 1249  Weight: 231 lb 4.8 oz (104.9 kg)    Mild wheeze heard on the right side  Physical Exam Vitals and nursing note reviewed.  HENT:     Head: Normocephalic and atraumatic.     Mouth/Throat:     Pharynx: Oropharynx is clear.  Eyes:     Extraocular Movements: Extraocular movements intact.     Pupils: Pupils are equal, round, and reactive to light.  Cardiovascular:     Rate and Rhythm: Normal rate and regular rhythm.  Pulmonary:     Comments: Decreased breath sounds bilaterally.  Abdominal:     Palpations: Abdomen is soft.  Musculoskeletal:        General: Normal range of motion.     Cervical back: Normal range of motion.  Skin:    General: Skin is warm.  Neurological:     General: No focal deficit present.     Mental Status: She is alert and oriented to person, place, and time.  Psychiatric:         Behavior: Behavior normal.        Judgment: Judgment normal.      LABORATORY DATA:  I have reviewed the data as listed Lab Results  Component Value Date   WBC 5.9 05/08/2024   HGB 12.9 05/08/2024   HCT 38.3 05/08/2024   MCV 84.4 05/08/2024   PLT 175 05/08/2024   Recent Labs    04/02/24 1237 04/26/24 0812 05/08/24 1239  NA 138 142 141  K 3.2* 4.4 3.8  CL 106 107 107  CO2 24 27 26   GLUCOSE 152* 118* 104*  BUN 14 13 13   CREATININE 0.71 0.83 0.76  CALCIUM  8.3* 8.7* 9.1  GFRNONAA >60 >60 >60  PROT 6.4* 6.5 6.8  ALBUMIN 3.1* 3.3* 3.7  AST 25 28 34  ALT 24 25 29   ALKPHOS 52 54 59  BILITOT 0.5 0.5 0.3    RADIOGRAPHIC STUDIES: I have personally reviewed the radiological images as listed and agreed with the findings in the report. No results found.   ASSESSMENT & PLAN:   Carcinoma of upper-outer quadrant of right breast in female, estrogen receptor positive (HCC) # MAY 2023- STAGE III-T4N1 ER/PR positive HER2 POSITIVE RIGHT breast cancer; LN-positive-ER/PR positive her-2 NEGATIVE; # s/p neoadjuvant chemotherapy- TCH plus P x 6 cycles- s/p Right mastectomy-[15th, NOV 2023] ypT2 [74mm]; ypN0.  Given partial response/involvement of the breast skin/multifocal disease-postmastectomy s/p RT- 08/27/2022. Patient currently on tamoxifen  [DEC-JAN 2024]; + OFS- Lupron  every 3 months ]. S/p  Ado-trastuzumab emtansine  x 14 cycles.  # JUNE 2nd, 2025- Stable examination without evidence of new or progressive disease in the chest, abdomen or pelvis;  Stable prominent mediastinal, mesenteric and retroperitoneal lymph nodes; Stable scattered pulmonary nodules. Monitor for now- will plan imaging in 4-6 months.   # Patient currently on adjuvant neratinib  160 mg /day tolerating with significant improvement of diarrhea/mucositis/abdominal cramping.  Continue 4 pills a day.  # acute bronchitis- no fevers- on tessalon  pearls/ and albutreol prn   # Right knee pain-likely secondary arthritis MSK. #  [facility] re: right knee pain- stable.  # Hypocalcemia-  ON adjuvant Zometa  q 6 months x 3 years [first- May-17th, 2024].   Continue ca+Vit D BID. Vit D FEB 2024- 54; [albumin 3.0]-  Stable.  # Infusion reaction - ZOmeta  [flu like symtoms]- imprived with NSAIDs-   Stable.  #  Diabetes- BG 118 [fasting]- on  Farxiga 10 mg- [OFF ozempic/metformin- intolerance]; on Farxiga-noted to have weight gain.  Defer to PCP-   # Hypokalemia: continue Kdur powder daily-  stable.  # PV access: port functional- # port/IV access-  flush q 6- 8 weeks for now.   Lupron  02/22/2024  q 20M; Zometa  [over 30 mins] q 6 M- #1 10/22/22   # DISPOSITION: # Keep dec appts as planned-   Dr.B  All questions were answered. The patient/family knows to call the clinic with any problems, questions or concerns.     Cindy JONELLE Joe, MD 05/08/2024 1:28 PM

## 2024-05-09 ENCOUNTER — Telehealth: Admitting: Family Medicine

## 2024-05-09 DIAGNOSIS — H6991 Unspecified Eustachian tube disorder, right ear: Secondary | ICD-10-CM

## 2024-05-09 MED ORDER — IPRATROPIUM BROMIDE 0.03 % NA SOLN: 2.0000 | Freq: Two times a day (BID) | NASAL | 0 refills | Status: AC

## 2024-05-09 NOTE — Progress Notes (Signed)

## 2024-05-13 ENCOUNTER — Telehealth: Admitting: Physician Assistant

## 2024-05-13 DIAGNOSIS — B9689 Other specified bacterial agents as the cause of diseases classified elsewhere: Secondary | ICD-10-CM | POA: Diagnosis not present

## 2024-05-13 DIAGNOSIS — J208 Acute bronchitis due to other specified organisms: Secondary | ICD-10-CM | POA: Diagnosis not present

## 2024-05-13 MED ORDER — AMOXICILLIN-POT CLAVULANATE 875-125 MG PO TABS: 1.0000 | ORAL_TABLET | Freq: Two times a day (BID) | ORAL | 0 refills | Status: AC

## 2024-05-13 MED ORDER — PROMETHAZINE-DM 6.25-15 MG/5ML PO SYRP: 5.0000 mL | ORAL_SOLUTION | Freq: Four times a day (QID) | ORAL | 0 refills | Status: AC | PRN

## 2024-05-13 NOTE — Patient Instructions (Signed)
 Montie KATHEE Gainer, thank you for joining Kirk GORMAN Sage, PA-C for today's virtual visit.  While this provider is not your primary care provider (PCP), if your PCP is located in our provider database this encounter information will be shared with them immediately following your visit.   A [facility] MyChart account gives you access to today's visit and all your visits, tests, and labs performed at [facility]  click here if you don't have a [facility] MyChart account or go to [email or web address]  Consent: (Patient) Marilyn Page provided verbal consent for this virtual visit at the beginning of the encounter.  Current Medications:  Current Outpatient Medications:    amoxicillin -clavulanate (AUGMENTIN ) 875-125 MG tablet, Take 1 tablet by mouth 2 (two) times daily for 7 days., Disp: 14 tablet, Rfl: 0   promethazine -dextromethorphan  (PROMETHAZINE -DM) 6.25-15 MG/5ML syrup, Take 5 mLs by mouth 4 (four) times daily as needed for cough., Disp: 118 mL, Rfl: 0   acetaminophen  (TYLENOL ) 325 MG tablet, Take 2 tablets (650 mg total) by mouth every 6 (six) hours as needed for mild pain (or Fever >/= 101)., Disp: , Rfl:    albuterol  (VENTOLIN  HFA) 108 (90 Base) MCG/ACT inhaler, Inhale 2 puffs into the lungs every 6 (six) hours as needed for wheezing or shortness of breath., Disp: 8 g, Rfl: 0   ascorbic acid  (VITAMIN C) 500 MG tablet, Take 1 tablet (500 mg total) by mouth daily., Disp: 30 tablet, Rfl:    benzonatate  (TESSALON ) 100 MG capsule, Take 1 capsule (100 mg total) by mouth 3 (three) times daily as needed for cough., Disp: 30 capsule, Rfl: 0   calcium -vitamin D  (OSCAL WITH D) 500-5 MG-MCG tablet, Take 1 tablet by mouth 2 (two) times daily. (Patient taking differently: Take 1 tablet by mouth daily with breakfast.), Disp: 60 tablet, Rfl: 2   Dapagliflozin Propanediol (FARXIGA PO), Take 10 mg by mouth daily., Disp: , Rfl:    ipratropium (ATROVENT ) 0.03 % nasal spray, Place 2 sprays into  both nostrils every 12 (twelve) hours., Disp: 30 mL, Rfl: 0   lidocaine -prilocaine  (EMLA ) cream, Apply on the port. 30 -45 min  prior to port access., Disp: 30 g, Rfl: 3   loperamide  (IMODIUM ) 2 MG capsule, Take 2 capsules (4 mg total) by mouth 4 (four) times daily as needed for diarrhea or loose stools. (MAX 16 mg/day), Disp: 240 capsule, Rfl: 1   Neratinib  Maleate (NERLYNX ) 40 MG tablet, Take 4 tablets (160 mg total) by mouth daily. Take with food., Disp: 120 tablet, Rfl: 0   ondansetron  (ZOFRAN ) 4 MG tablet, Take 1 tablet (4 mg total) by mouth every 8 (eight) hours as needed for up to 20 doses for nausea or vomiting., Disp: 20 tablet, Rfl: 0   ondansetron  (ZOFRAN -ODT) 8 MG disintegrating tablet, Take 8 mg by mouth every 8 (eight) hours as needed for nausea or vomiting., Disp: , Rfl:    ONETOUCH VERIO test strip, SMARTSIG:1 Each Via Meter Daily PRN, Disp: , Rfl:    pioglitazone (ACTOS) 15 MG tablet, Take 15 mg by mouth daily., Disp: , Rfl:    potassium chloride  (KLOR-CON ) 20 MEQ packet, TAKE 20 MEQ BY MOUTH 2 (TWO) TIMES DAILY., Disp: 180 packet, Rfl: 1   prochlorperazine  (COMPAZINE ) 10 MG tablet, Take 1 tablet (10 mg total) by mouth every 6 (six) hours as needed for nausea or vomiting., Disp: 40 tablet, Rfl: 1   tamoxifen  (NOLVADEX ) 20 MG tablet, TAKE 1 TABLET BY MOUTH EVERY DAY, Disp: 90 tablet, Rfl:  1 No current facility-administered medications for this visit.  Facility-Administered Medications Ordered in Other Visits:    sodium chloride  flush (NS) 0.9 % injection 10 mL, 10 mL, Intravenous, PRN, Brahmanday, Govinda R, MD, 10 mL at 08/17/23 1525   Medications ordered in this encounter:  Meds ordered this encounter  Medications   amoxicillin -clavulanate (AUGMENTIN ) 875-125 MG tablet    Sig: Take 1 tablet by mouth 2 (two) times daily for 7 days.    Dispense:  14 tablet    Refill:  0    Supervising Provider:   LAMPTEY, PHILIP O [[id number]]   promethazine -dextromethorphan  (PROMETHAZINE -DM)  6.25-15 MG/5ML syrup    Sig: Take 5 mLs by mouth 4 (four) times daily as needed for cough.    Dispense:  118 mL    Refill:  0    Supervising Provider:   BLAISE ALEENE KIDD [[id number]]     *If you need refills on other medications prior to your next appointment, please contact your pharmacy*  Follow-Up: Call back or seek an in-person evaluation if the symptoms worsen or if the condition fails to improve as anticipated.  [facility] [phone]  Other Instructions Acute Bronchitis, Adult  Acute bronchitis is sudden inflammation of the main airways (bronchi) that come off the windpipe (trachea) in the lungs. The swelling causes the airways to get smaller and make more mucus than normal. This can make it hard to breathe and can cause coughing or noisy breathing (wheezing). Acute bronchitis may last several weeks. The cough may last longer. Allergies, asthma, and exposure to smoke may make the condition worse. What are the causes? This condition can be caused by germs and by substances that irritate the lungs, including: Cold and flu viruses. The most common cause of this condition is the virus that causes the common cold. Bacteria. This is less common. Breathing in substances that irritate the lungs, including: Smoke from cigarettes and other forms of tobacco. Dust and pollen. Fumes from household cleaning products, gases, or burned fuel. Indoor or outdoor air pollution. What increases the risk? The following factors may make you more likely to develop this condition: A weak body's defense system, also called the immune system. A condition that affects your lungs and breathing, such as asthma. What are the signs or symptoms? Common symptoms of this condition include: Coughing. This may bring up clear, yellow, or green mucus from your lungs (sputum). Wheezing. Runny or stuffy nose. Having too much mucus in your lungs (chest congestion). Shortness of breath. Aches and  pains, including sore throat or chest. How is this diagnosed? This condition is usually diagnosed based on: Your symptoms and medical history. A physical exam. You may also have other tests, including tests to rule out other conditions, such as pneumonia. These tests include: A test of lung function. Test of a mucus sample to look for the presence of bacteria. Tests to check the oxygen level in your blood. Blood tests. Chest X-ray. How is this treated? Most cases of acute bronchitis clear up over time without treatment. Your health care provider may recommend: Drinking more fluids to help thin your mucus so it is easier to cough up. Taking inhaled medicine (inhaler) to improve air flow in and out of your lungs. Using a vaporizer or a humidifier. These are machines that add water  to the air to help you breathe better. Taking a medicine that thins mucus and clears congestion (expectorant). Taking a medicine that prevents or stops coughing (cough suppressant). It  is not common to take an antibiotic medicine for this condition. Follow these instructions at home:  Take over-the-counter and prescription medicines only as told by your health care provider. Use an inhaler, vaporizer, or humidifier as told by your health care provider. Take two teaspoons (10 mL) of honey at bedtime to lessen coughing at night. Drink enough fluid to keep your urine pale yellow. Do not use any products that contain nicotine or tobacco. These products include cigarettes, chewing tobacco, and vaping devices, such as e-cigarettes. If you need help quitting, ask your health care provider. Get plenty of rest. Return to your normal activities as told by your health care provider. Ask your health care provider what activities are safe for you. Keep all follow-up visits. This is important. How is this prevented? To lower your risk of getting this condition again: Wash your hands often with soap and water  for at least 20  seconds. If soap and water  are not available, use hand sanitizer. Avoid contact with people who have cold symptoms. Try not to touch your mouth, nose, or eyes with your hands. Avoid breathing in smoke or chemical fumes. Breathing smoke or chemical fumes will make your condition worse. Get the flu shot every year. Contact a health care provider if: Your symptoms do not improve after 2 weeks. You have trouble coughing up the mucus. Your cough keeps you awake at night. You have a fever. Get help right away if you: Cough up blood. Feel pain in your chest. Have severe shortness of breath. Faint or keep feeling like you are going to faint. Have a severe headache. Have a fever or chills that get worse. These symptoms may represent a serious problem that is an emergency. Do not wait to see if the symptoms will go away. Get medical help right away. Call your local emergency services (911 in the [place] Do not drive yourself to the hospital. Summary Acute bronchitis is inflammation of the main airways (bronchi) that come off the windpipe (trachea) in the lungs. The swelling causes the airways to get smaller and make more mucus than normal. Drinking more fluids can help thin your mucus so it is easier to cough up. Take over-the-counter and prescription medicines only as told by your health care provider. Do not use any products that contain nicotine or tobacco. These products include cigarettes, chewing tobacco, and vaping devices, such as e-cigarettes. If you need help quitting, ask your health care provider. Contact a health care provider if your symptoms do not improve after 2 weeks. This information is not intended to replace advice given to you by your health care provider. Make sure you discuss any questions you have with your health care provider. Document Revised: 09/03/2021 Document Reviewed: 09/24/2020 Elsevier Patient Education  2024 Elsevier Inc.   If you have been instructed to have an  in-person evaluation today at a local Urgent Care facility, please use the link below. It will take you to a list of all of our available [facility], including address, phone number and hours of operation. Please do not delay care.  [facility]  If you or a family member do not have a primary care provider, use the link below to schedule a visit and establish care. When you choose a [facility] primary care physician or advanced practice provider, you gain a long-term partner in health. Find a Primary Care Provider  Learn more about [facility]'s in-office and virtual care options: [facility] -  Get Care Now

## 2024-05-13 NOTE — Progress Notes (Signed)
 Virtual Visit Consent   Marilyn Page, you are scheduled for a virtual visit with a [facility] provider today. Just as with appointments in the office, your consent must be obtained to participate. Your consent will be active for this visit and any virtual visit you may have with one of our providers in the next 365 days. If you have a MyChart account, a copy of this consent can be sent to you electronically.  As this is a virtual visit, video technology does not allow for your provider to perform a traditional examination. This may limit your provider's ability to fully assess your condition. If your provider identifies any concerns that need to be evaluated in person or the need to arrange testing (such as labs, EKG, etc.), we will make arrangements to do so. Although advances in technology are sophisticated, we cannot ensure that it will always work on either your end or our end. If the connection with a video visit is poor, the visit may have to be switched to a telephone visit. With either a video or telephone visit, we are not always able to ensure that we have a secure connection.  By engaging in this virtual visit, you consent to the provision of healthcare and authorize for your insurance to be billed (if applicable) for the services provided during this visit. Depending on your insurance coverage, you may receive a charge related to this service.  I need to obtain your verbal consent now. Are you willing to proceed with your visit today? Marilyn Page has provided verbal consent on 05/13/2024 for a virtual visit (video or telephone). Marilyn GORMAN Sage, PA-C  Date: 05/13/2024 1:19 PM   Virtual Visit via Video Note   I, Marilyn Page, connected with  Marilyn Page  ([id number], 05-09-1976) on 05/13/24 at  1:15 PM EST by a video-enabled telemedicine application and verified that I am speaking with the correct person using two identifiers.  Location: Patient: Virtual Visit Location Patient:  Home Provider: Virtual Visit Location Provider: Home Office   I discussed the limitations of evaluation and management by telemedicine and the availability of in person appointments. The patient expressed understanding and agreed to proceed.    History of Present Illness: Marilyn Page is a 48 y.o. who identifies as a female who was assigned female at birth, and is being seen today  with a persistent cough and earache.  She has had a persistent, non-productive cough since the Monday before Thanksgiving. Benzonatate  one capsule three times daily, a spray, and an inhaler have not helped. She feels like she needs to cough up or vomit but cannot produce sputum. She denies recent antibiotic use or known sick contacts. She has used Vicks VapoRub without much relief.   Problems:  Patient Active Problem List   Diagnosis Date Noted   Adjustment disorder with mixed anxiety and depressed mood 09/05/2023   Acquired absence of right breast 06/18/2022   Low vitamin B12 level 05/24/2022   Hypomagnesemia 05/16/2022   Lactic acid acidosis 03/06/2022   Fever and chills 03/05/2022   COVID-19 virus infection 12/28/2021   Thrombocytopenia 12/28/2021   Diabetes mellitus (HCC) 12/28/2021   Febrile neutropenia 12/27/2021   Sepsis (HCC) 12/27/2021   Genetic testing 12/01/2021   Carcinoma of upper-outer quadrant of right breast in female, estrogen receptor positive (HCC) 10/26/2021   Uterus, adenomyosis 05/14/2021   ASCUS of cervix with negative high risk HPV 04/23/2021   Iron deficiency anemia due to chronic blood  loss 04/23/2021   Type 2 diabetes mellitus without complication, without long-term current use of insulin  (HCC) 05/18/2020   RLS (restless legs syndrome) 05/14/2020   Menorrhagia with regular cycle 10/14/2017   BMI 37.0-37.9, adult 10/03/2017   History of gestational diabetes 10/03/2017    Allergies:  Allergies  Allergen Reactions   Metformin Other (See Comments)   Oxycodone  Nausea And  Vomiting   Medications:  Current Outpatient Medications:    amoxicillin -clavulanate (AUGMENTIN ) 875-125 MG tablet, Take 1 tablet by mouth 2 (two) times daily for 7 days., Disp: 14 tablet, Rfl: 0   promethazine -dextromethorphan  (PROMETHAZINE -DM) 6.25-15 MG/5ML syrup, Take 5 mLs by mouth 4 (four) times daily as needed for cough., Disp: 118 mL, Rfl: 0   acetaminophen  (TYLENOL ) 325 MG tablet, Take 2 tablets (650 mg total) by mouth every 6 (six) hours as needed for mild pain (or Fever >/= 101)., Disp: , Rfl:    albuterol  (VENTOLIN  HFA) 108 (90 Base) MCG/ACT inhaler, Inhale 2 puffs into the lungs every 6 (six) hours as needed for wheezing or shortness of breath., Disp: 8 g, Rfl: 0   ascorbic acid  (VITAMIN C) 500 MG tablet, Take 1 tablet (500 mg total) by mouth daily., Disp: 30 tablet, Rfl:    benzonatate  (TESSALON ) 100 MG capsule, Take 1 capsule (100 mg total) by mouth 3 (three) times daily as needed for cough., Disp: 30 capsule, Rfl: 0   calcium -vitamin D  (OSCAL WITH D) 500-5 MG-MCG tablet, Take 1 tablet by mouth 2 (two) times daily. (Patient taking differently: Take 1 tablet by mouth daily with breakfast.), Disp: 60 tablet, Rfl: 2   Dapagliflozin Propanediol (FARXIGA PO), Take 10 mg by mouth daily., Disp: , Rfl:    ipratropium (ATROVENT ) 0.03 % nasal spray, Place 2 sprays into both nostrils every 12 (twelve) hours., Disp: 30 mL, Rfl: 0   lidocaine -prilocaine  (EMLA ) cream, Apply on the port. 30 -45 min  prior to port access., Disp: 30 g, Rfl: 3   loperamide  (IMODIUM ) 2 MG capsule, Take 2 capsules (4 mg total) by mouth 4 (four) times daily as needed for diarrhea or loose stools. (MAX 16 mg/day), Disp: 240 capsule, Rfl: 1   Neratinib  Maleate (NERLYNX ) 40 MG tablet, Take 4 tablets (160 mg total) by mouth daily. Take with food., Disp: 120 tablet, Rfl: 0   ondansetron  (ZOFRAN ) 4 MG tablet, Take 1 tablet (4 mg total) by mouth every 8 (eight) hours as needed for up to 20 doses for nausea or vomiting., Disp: 20  tablet, Rfl: 0   ondansetron  (ZOFRAN -ODT) 8 MG disintegrating tablet, Take 8 mg by mouth every 8 (eight) hours as needed for nausea or vomiting., Disp: , Rfl:    ONETOUCH VERIO test strip, SMARTSIG:1 Each Via Meter Daily PRN, Disp: , Rfl:    pioglitazone (ACTOS) 15 MG tablet, Take 15 mg by mouth daily., Disp: , Rfl:    potassium chloride  (KLOR-CON ) 20 MEQ packet, TAKE 20 MEQ BY MOUTH 2 (TWO) TIMES DAILY., Disp: 180 packet, Rfl: 1   prochlorperazine  (COMPAZINE ) 10 MG tablet, Take 1 tablet (10 mg total) by mouth every 6 (six) hours as needed for nausea or vomiting., Disp: 40 tablet, Rfl: 1   tamoxifen  (NOLVADEX ) 20 MG tablet, TAKE 1 TABLET BY MOUTH EVERY DAY, Disp: 90 tablet, Rfl: 1 No current facility-administered medications for this visit.  Facility-Administered Medications Ordered in Other Visits:    sodium chloride  flush (NS) 0.9 % injection 10 mL, 10 mL, Intravenous, PRN, Brahmanday, Govinda R, MD, 10 mL at 08/17/23  1525  Observations/Objective: Patient is well-developed, well-nourished in no acute distress.  Resting comfortably  at home.  Head is normocephalic, atraumatic.  No labored breathing.  Speech is clear and coherent with logical content.  Patient is alert and oriented at baseline.    Assessment and Plan: 1. Acute bacterial bronchitis (Primary) - amoxicillin -clavulanate (AUGMENTIN ) 875-125 MG tablet; Take 1 tablet by mouth 2 (two) times daily for 7 days.  Dispense: 14 tablet; Refill: 0 - promethazine -dextromethorphan  (PROMETHAZINE -DM) 6.25-15 MG/5ML syrup; Take 5 mLs by mouth 4 (four) times daily as needed for cough.  Dispense: 118 mL; Refill: 0   Persistent cough unresponsive to current treatments. Possible bacterial cause considered. - Prescribed Augmentin  twice daily for 7 days. - Continue supportive care  - Advised Vicks VapoRub and steamy showers for congestion. - Trial Promethazine -DM.   Follow Up Instructions: I discussed the assessment and treatment plan with  the patient. The patient was provided an opportunity to ask questions and all were answered. The patient agreed with the plan and demonstrated an understanding of the instructions.  A copy of instructions were sent to the patient via MyChart unless otherwise noted below.     The patient was advised to call back or seek an in-person evaluation if the symptoms worsen or if the condition fails to improve as anticipated.    Marilyn RAMAN Mayers, PA-C

## 2024-05-25 ENCOUNTER — Encounter: Payer: Self-pay | Admitting: Internal Medicine

## 2024-05-28 ENCOUNTER — Inpatient Hospital Stay

## 2024-05-28 ENCOUNTER — Encounter: Payer: Self-pay | Admitting: Internal Medicine

## 2024-05-28 ENCOUNTER — Inpatient Hospital Stay: Admitting: Internal Medicine

## 2024-05-28 VITALS — BP 116/51 | HR 76 | Temp 97.5°F | Resp 16 | Ht 66.0 in | Wt 224.3 lb

## 2024-05-28 DIAGNOSIS — Z23 Encounter for immunization: Secondary | ICD-10-CM

## 2024-05-28 DIAGNOSIS — Z170 Estrogen receptor positive status [ER+]: Secondary | ICD-10-CM

## 2024-05-28 DIAGNOSIS — D500 Iron deficiency anemia secondary to blood loss (chronic): Secondary | ICD-10-CM

## 2024-05-28 DIAGNOSIS — C50411 Malignant neoplasm of upper-outer quadrant of right female breast: Secondary | ICD-10-CM | POA: Diagnosis not present

## 2024-05-28 DIAGNOSIS — Z5111 Encounter for antineoplastic chemotherapy: Secondary | ICD-10-CM | POA: Diagnosis not present

## 2024-05-28 LAB — CBC WITH DIFFERENTIAL (CANCER CENTER ONLY)
Abs Immature Granulocytes: 0.04 K/uL (ref 0.00–0.07)
Basophils Absolute: 0 K/uL (ref 0.0–0.1)
Basophils Relative: 1 %
Eosinophils Absolute: 0.2 K/uL (ref 0.0–0.5)
Eosinophils Relative: 3 %
HCT: 36.2 % (ref 36.0–46.0)
Hemoglobin: 12.1 g/dL (ref 12.0–15.0)
Immature Granulocytes: 1 %
Lymphocytes Relative: 25 %
Lymphs Abs: 1.2 K/uL (ref 0.7–4.0)
MCH: 28.5 pg (ref 26.0–34.0)
MCHC: 33.4 g/dL (ref 30.0–36.0)
MCV: 85.4 fL (ref 80.0–100.0)
Monocytes Absolute: 0.3 K/uL (ref 0.1–1.0)
Monocytes Relative: 6 %
Neutro Abs: 3 K/uL (ref 1.7–7.7)
Neutrophils Relative %: 64 %
Platelet Count: 207 K/uL (ref 150–400)
RBC: 4.24 MIL/uL (ref 3.87–5.11)
RDW: 13.7 % (ref 11.5–15.5)
WBC Count: 4.6 K/uL (ref 4.0–10.5)
nRBC: 0 % (ref 0.0–0.2)

## 2024-05-28 LAB — CMP (CANCER CENTER ONLY)
ALT: 29 U/L (ref 0–44)
AST: 38 U/L (ref 15–41)
Albumin: 3.7 g/dL (ref 3.5–5.0)
Alkaline Phosphatase: 59 U/L (ref 38–126)
Anion gap: 9 (ref 5–15)
BUN: 12 mg/dL (ref 6–20)
CO2: 26 mmol/L (ref 22–32)
Calcium: 9.2 mg/dL (ref 8.9–10.3)
Chloride: 105 mmol/L (ref 98–111)
Creatinine: 0.7 mg/dL (ref 0.44–1.00)
GFR, Estimated: >60 mL/min
Glucose, Bld: 118 mg/dL — ABNORMAL HIGH (ref 70–99)
Potassium: 3.7 mmol/L (ref 3.5–5.1)
Sodium: 140 mmol/L (ref 135–145)
Total Bilirubin: 0.4 mg/dL (ref 0.0–1.2)
Total Protein: 6.9 g/dL (ref 6.5–8.1)

## 2024-05-28 LAB — VITAMIN D 25 HYDROXY (VIT D DEFICIENCY, FRACTURES): Vit D, 25-Hydroxy: 103 ng/mL — ABNORMAL HIGH (ref 30–100)

## 2024-05-28 MED ORDER — INFLUENZA VAC SPLIT QUAD 0.5 ML IM SUSY: 0.5000 mL | PREFILLED_SYRINGE | Freq: Once | INTRAMUSCULAR | Status: DC

## 2024-05-28 MED ORDER — INFLUENZA VIRUS VACC SPLIT PF (FLUZONE) 0.5 ML IM SUSY
0.5000 mL | PREFILLED_SYRINGE | Freq: Once | INTRAMUSCULAR | Status: AC
  Administered 2024-05-28: 0.5 mL via INTRAMUSCULAR
  Filled 2024-05-28: qty 0.5

## 2024-05-28 MED ORDER — LEUPROLIDE ACETATE (3 MONTH) 11.25 MG IM KIT
11.2500 mg | PACK | Freq: Once | INTRAMUSCULAR | Status: AC
  Administered 2024-05-28: 11.25 mg via INTRAMUSCULAR
  Filled 2024-05-28: qty 11.25

## 2024-05-28 NOTE — Progress Notes (Signed)
 [facility] CONSULT NOTE  Patient Care Team: [name], NP as PCP - General (Family Medicine) [name], RN as Oncology Nurse Navigator [name], [name]  CHIEF COMPLAINTS/PURPOSE OF CONSULTATION: Breast cancer  Oncology History Overview Note  MAY 2023-    Targeted ultrasound is performed, showing a 2.9 x 1.9 x 2.0 cm irregular hypoechoic mass right breast 10 o'clock position 6 cm from nipple at the site of palpable concern.   There is an abnormal 8 mm thickened right axillary lymph node.   There is skin thickening overlying the right breast. ------------------  A. BREAST, RIGHT AT 10:00, 6 CM FROM THE NIPPLE; ULTRASOUND-GUIDED CORE  NEEDLE BIOPSY:  - INVASIVE MAMMARY CARCINOMA, NO SPECIAL TYPE.   Size of invasive carcinoma: 11 mm in this sample  Histologic grade of invasive carcinoma: Grade 2                       Glandular/tubular differentiation score: 3                       Nuclear pleomorphism score: 2                       Mitotic rate score: 1                       Total score: 6  Ductal carcinoma in situ: Present, intermediate grade  Lymphovascular invasion: Not identified   B. LYMPH NODE, RIGHT AXILLARY; ULTRASOUND-GUIDED CORE NEEDLE BIOPSY:  - 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 IN GREATEST  EXTENT.   Comment:  The malignancy in the primary breast lesion appears morphologically  different from tumor in the lymph node sampling, with tumor present in  lymph node more suggestive of a histologic grade 3, with significantly  increased mitotic activity and pleomorphism. Due to the discrepancy in  these morphologic patterns, ER/PR/HER2 immunohistochemistry will be  performed on both blocks A1 and B1, with reflex to FISH for HER2 2+. The  results will be reported in an addendum.   ADDENDUM:  CASE SUMMARY: BREAST BIOMARKER TESTS - A - BREAST, RIGHT AT 10:00  Estrogen Receptor  (ER) Status: POSITIVE          Percentage of cells with nuclear positivity: 71-80%          Average intensity of staining: Strong   Progesterone Receptor (PgR) Status: POSITIVE          Percentage of cells with nuclear positivity: 81-90%          Average intensity of staining: Strong   HER2 (by immunohistochemistry): POSITIVE (Score 3+)       Percentage of cells with uniform intense complete membrane  staining: 61-70%  HER2 displays a heterogeneous staining pattern, with areas showing a  strong 3+ staining pattern, and other regions with complete absence (0+)  of staining.   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  Discussed with Dr. Mabeline tumor heterogeneity. BREAST MRI- JUNE 2023- The patient's known malignancy in the upper outer quadrant of the right breast measures 3.8 x 2.8 x 3.6 cm. Numerous other suspicious masses are identified in the right breast as above involving the upper outer and upper inner quadrants, located both anterior and posterior to the known malignancy.  Numerous enhancing foci in the skin as above are worrisome for skin metastases ; Bx proven. Biopsy proven metastatic node in the right axilla.  No evidence of malignancy in the left breast;  Two mildly prominent right internal mammary nodes were not FDG avid on recent PET-CT imaging.  Currently on neoadjuvant chemotherapy- TCH  plus P x6 cycles;  MUGA scan May 31st, 2023- -61%.    # June 7th, 2023- NEO-ADJUVANT CHEMO- TCH+P x6 cycles- s/p Mastectomy [NOV 15th, 2023] [Dr.Byrnett]-  ypT2 [52mm]; ypN0.  # JAN mid, 2024 -TAMOXFEN 20 mg a day; ADD LupronFEB 2nd.2 2024 s/p RT maech, 22nd- 2024  # FEB 2nd, 2023- Kadcyla  q 3 W x14 cycles- finished dec 2024.   # OCT 17th, 2025- start NERATINIB  step up dose. November 20th-2025-decrease to 160 mg / 4 pills a day [diarrhea mucositis abdominal cramping]  # Lupron  q3 M [/start 2/07-2022]   # Genetic counseling s/p- NEG for any deleterious mutations.     Carcinoma of upper-outer quadrant of right breast in female, estrogen receptor positive (HCC)  10/26/2021 Initial Diagnosis   Carcinoma of upper-outer quadrant of right breast in female, estrogen receptor positive (HCC)   10/26/2021 Cancer Staging   Staging form: Breast, AJCC 8th Edition - Clinical: Stage IIIA (cT4d, cN1, cM0, G2, ER+, PR+, HER2+) - Signed by [name], MD on 06/04/2022 Histologic grading system: 3 grade system   11/10/2021 - 03/01/2022 Chemotherapy   Patient is on Treatment Plan : BREAST  Docetaxel  + Carboplatin  + Trastuzumab  + Pertuzumab   (TCHP) q21d      11/11/2021 - 01/13/2022 Chemotherapy   Patient is on Treatment Plan : BREAST  Docetaxel  + Carboplatin  + Trastuzumab  + Pertuzumab   (TCHP) q21d       Genetic Testing   Negative genetic testing. No pathogenic variants identified on the [facility] CancerNext-Expanded+RNA panel. The report date is 11/29/2021.  The CancerNext-Expanded + RNAinsight gene panel offered by [employer] and includes sequencing and rearrangement analysis for the following 77 genes: IP, ALK, APC*, ATM*, AXIN2, BAP1, BARD1, BLM, BMPR1A, BRCA1*, BRCA2*, BRIP1*, CDC73, CDH1*,CDK4, CDKN1B, CDKN2A, CHEK2*, CTNNA1, DICER1, FANCC, FH, FLCN, GALNT12, KIF1B, LZTR1, MAX, MEN1, MET, MLH1*, MSH2*, MSH3, MSH6*, MUTYH*, NBN, NF1*, NF2, NTHL1, PALB2*, PHOX2B, PMS2*, POT1, PRKAR1A, PTCH1, PTEN*,  RAD51C*, RAD51D*,RB1, RECQL, RET, SDHA, SDHAF2, SDHB, SDHC, SDHD, SMAD4, SMARCA4, SMARCB1, SMARCE1, STK11, SUFU, TMEM127, TP53*,TSC1, TSC2, VHL and XRCC2 (sequencing and deletion/duplication); EGFR, EGLN1, HOXB13, KIT, MITF, PDGFRA, POLD1 and POLE (sequencing only); EPCAM and GREM1 (deletion/duplication only).   07/09/2022 -  Chemotherapy   Patient is on Treatment Plan : BREAST ADO-Trastuzumab Emtansine  (Kadcyla ) q21d      HISTORY OF PRESENTING ILLNESS: Patient is ambulating independently.  Alone,   Marilyn Page 48 y.o.  female right breast TRIPLE POSITIVE with T4- Stage III -s/p mastectomy-currently on adjuvant tamoxifen  + OFS + Neratinib   is here for follow-up.   Discussed the  use of AI scribe software for clinical note transcription with the patient, who gave verbal consent to proceed.  History of Present Illness   Marilyn Page is a 48 year old female with ER/PR/HER2-positive right breast cancer, status post mastectomy, currently receiving adjuvant tamoxifen , neratinib , and Lupron , who presents for oncology follow-up and management of ongoing adjuvant therapy.  She is currently taking adjuvant tamoxifen  and neratinib  (four pills daily). She experienced a delay in her neratinib  prescription shipment, with only enough medication for today and three pills for tomorrow; the shipment is expected to arrive by tomorrow afternoon. She previously developed diarrhea with neratinib  and currently denies diarrhea, describing her stools as very soft. She reports only occasional abdominal discomfort, particularly postprandially, and no other significant gastrointestinal side effects.  She continues to experience intermittent cramping pain in the right rib and abdominal area, provoked by certain movements or coughing, described as a catching sensation at the first rib. These symptoms have been present regularly for the past couple of weeks. She also notes intermittent phantom pain since her mastectomy. She  recently completed a course of amoxicillin  for bronchitis, with significant improvement in her cough; only minimal morning cough persists.  She reports persistent nasal congestion and bilateral ear fullness, with a sensation of clogged ears that intermittently pop but do not clear completely. She describes a sensation of being in a tunnel. She has not recently used antihistamines but has them available at home. She has not received a flu vaccination this year.        Review of Systems  Constitutional:  Positive for malaise/fatigue. Negative for chills, diaphoresis, fever and weight loss.  HENT:  Negative for nosebleeds and sore throat.   Eyes:  Negative for double vision.  Respiratory:  Negative for cough, hemoptysis, sputum production, shortness of breath and wheezing.   Cardiovascular:  Negative for chest pain, palpitations, orthopnea and leg swelling.  Gastrointestinal:  Negative for abdominal pain, blood in stool, constipation, diarrhea, heartburn, melena, nausea and vomiting.  Genitourinary:  Negative for dysuria, frequency and urgency.  Musculoskeletal:  Positive for joint pain and myalgias. Negative for back pain.  Skin: Negative.  Negative for itching and rash.  Neurological:  Negative for dizziness, tingling, focal weakness, weakness and headaches.  Endo/Heme/Allergies:  Does not bruise/bleed easily.  Psychiatric/Behavioral:  Negative for depression. The patient is not nervous/anxious and does not have insomnia.      MEDICAL HISTORY:  Past Medical History:  Diagnosis Date   Anemia    Carcinoma of upper-outer quadrant of right breast in female, estrogen receptor positive (HCC) 10/26/2021   Diabetes mellitus without complication (HCC)    Family history of adverse reaction to anesthesia    grandmother had issues but doesnt know what the issue was, she was older.   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 Axilla Butterfly hydro clip-MACRO METASTATIC MAMMARY CARCINOMA   BREAST BIOPSY Right 04/21/2022   US  RT PLC BREAST LOC DEV   1ST LESION  INC US  GUIDE 04/21/2022 [facility]   BREAST RECONSTRUCTION WITH PLACEMENT OF TISSUE EXPANDER AND FLEX HD (ACELLULAR HYDRATED DERMIS) Right 04/21/2022  Procedure: RIGHT BREAST RECONSTRUCTION WITH PLACEMENT OF TISSUE EXPANDER AND FLEX HD (ACELLULAR HYDRATED DERMIS);  Surgeon: Lowery Estefana RAMAN, DO;  Location: [facility] ORS;  Service: Plastics;  Laterality: Right;   CESAREAN SECTION  2005/2008   DILATION AND CURETTAGE OF UTERUS  2003   ESOPHAGOGASTRODUODENOSCOPY N/A 11/02/2023   Procedure: EGD (ESOPHAGOGASTRODUODENOSCOPY);  Surgeon: Toledo, Ladell POUR, MD;  Location: [facility];  Service: Gastroenterology;  Laterality: N/A;   MASTECTOMY Right 04/2023   PORTACATH PLACEMENT Left 11/09/2021   Procedure: INSERTION PORT-A-CATH;  Surgeon: Dessa Reyes ORN, MD;  Location: [facility] OR[facility];  Service: General;  Laterality: Left;   REMOVAL OF TISSUE EXPANDER AND PLACEMENT OF IMPLANT Right 06/08/2022   Procedure: REMOVAL OF TISSUE EXPANDER;  Surgeon: Lowery Estefana RAMAN, DO;  Location: [facility];  Service: Plastics;  Laterality: Right;   SIMPLE MASTECTOMY WITH AXILLARY SENTINEL NODE BIOPSY Right 04/21/2022   Procedure: SIMPLE MASTECTOMY WITH AXILLARY SENTINEL NODE BIOPSY;  Surgeon: Dessa Reyes ORN, MD;  Location: [facility];  Service: General;  Laterality: Right;  wire localization of lymph node   SKIN BIOPSY Right 11/09/2021   Procedure: PUNCH BIOPSY SKIN, TATTOO LYMPH NODE RIGHT AXILLA;  Surgeon: Dessa Reyes ORN, MD;  Location: [facility] OR[facility];  Service: General;  Laterality: Right;   WISDOM  TOOTH EXTRACTION      SOCIAL HISTORY: Social History   Socioeconomic History   Marital status: Married    Spouse name: Bonnie   Number of children: 2   Years of education: Not on file   Highest education level: Not on file  Occupational History   Occupation: and architectural technologist  Tobacco Use   Smoking status: Never   Smokeless tobacco: Never  Vaping Use   Vaping status: Never Used  Substance and Sexual Activity   Alcohol use: Never   Drug use: Never   Sexual activity: Yes    Birth control/protection: Other-see comments, Surgical    Comment: vasectomy  Other Topics Concern   Not on file  Social History Narrative   Pre-school teacher; in [place]; 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    Social Drivers of Health   Tobacco Use: Low Risk (05/28/2024)   Patient History    Smoking Tobacco Use: Never    Smokeless Tobacco Use: Never    Passive Exposure: Not on file  Financial Resource Strain: Low Risk  (01/02/2024)   Received from [facility]   Overall Financial Resource Strain (CARDIA)    Difficulty of Paying Living Expenses: Not hard at all  Food Insecurity: No Food Insecurity (01/02/2024)   Received from [facility]   Epic    Within the past 12 months, you worried that your food would run out before you got the money to buy more.: Never true    Within the past 12 months, the food you bought just didn't last and you didn't have money to get more.: Never true  Transportation Needs: No Transportation Needs (01/02/2024)   Received from [facility] - Transportation    In the past 12 months, has lack of transportation kept you from medical appointments or from getting medications?: No    Lack of Transportation (Non-Medical): No  Physical Activity: Not on file  Stress: Not on file  Social Connections: Not on file  Intimate Partner Violence: Not on file  Depression (EYV7-0): Low Risk (05/28/2024)   Depression (PHQ2-9)     PHQ-2 Score: 0  Alcohol Screen: Not on file  Housing: Low Risk  (  01/02/2024)   Received from [facility]   Epic    In the last 12 months, was there a time when you were not able to pay the mortgage or rent on time?: No    In the past 12 months, how many times have you moved where you were living?: 0    At any time in the past 12 months, were you homeless or living in a shelter (including now)?: No  Utilities: Not At Risk (01/02/2024)   Received from [facility]   Epic    In the past 12 months has the electric, gas, oil, or water  company threatened to shut off services in your home?: No  Health Literacy: Not on file    FAMILY HISTORY: Family History  Problem Relation Age of Onset   Ovarian cysts Mother    Migraines Mother    Melanoma Mother        on leg   Hyperlipidemia Father    Diabetes Maternal Aunt    Stomach cancer Paternal Aunt    Diabetes Maternal Grandfather    Lymphoma Cousin 17   Breast cancer Neg Hx     ALLERGIES:  is allergic to metformin and oxycodone .  MEDICATIONS:  Current Outpatient Medications  Medication Sig Dispense Refill   acetaminophen  (TYLENOL ) 325 MG tablet Take 2 tablets (650 mg total) by mouth every 6 (six) hours as needed for mild pain (or Fever >/= 101).     albuterol  (VENTOLIN  HFA) 108 (90 Base) MCG/ACT inhaler Inhale 2 puffs into the lungs every 6 (six) hours as needed for wheezing or shortness of breath. 8 g 0   ascorbic acid  (VITAMIN C) 500 MG tablet Take 1 tablet (500 mg total) by mouth daily. 30 tablet    benzonatate  (TESSALON ) 100 MG capsule Take 1 capsule (100 mg total) by mouth 3 (three) times daily as needed for cough. 30 capsule 0   calcium -vitamin D  (OSCAL WITH D) 500-5 MG-MCG tablet Take 1 tablet by mouth 2 (two) times daily. (Patient taking differently: Take 1 tablet by mouth daily with breakfast.) 60 tablet 2   Dapagliflozin Propanediol (FARXIGA PO) Take 10 mg by mouth daily.     ipratropium  (ATROVENT ) 0.03 % nasal spray Place 2 sprays into both nostrils every 12 (twelve) hours. 30 mL 0   lidocaine -prilocaine  (EMLA ) cream Apply on the port. 30 -45 min  prior to port access. 30 g 3   loperamide  (IMODIUM ) 2 MG capsule Take 2 capsules (4 mg total) by mouth 4 (four) times daily as needed for diarrhea or loose stools. (MAX 16 mg/day) 240 capsule 1   Neratinib  Maleate (NERLYNX ) 40 MG tablet Take 4 tablets (160 mg total) by mouth daily. Take with food. 120 tablet 0   ondansetron  (ZOFRAN ) 4 MG tablet Take 1 tablet (4 mg total) by mouth every 8 (eight) hours as needed for up to 20 doses for nausea or vomiting. 20 tablet 0   ondansetron  (ZOFRAN -ODT) 8 MG disintegrating tablet Take 8 mg by mouth every 8 (eight) hours as needed for nausea or vomiting.     ONETOUCH VERIO test strip SMARTSIG:1 Each Via Meter Daily PRN     pioglitazone (ACTOS) 15 MG tablet Take 15 mg by mouth daily.     prochlorperazine  (COMPAZINE ) 10 MG tablet Take 1 tablet (10 mg total) by mouth every 6 (six) hours as needed for nausea or vomiting. 40 tablet 1   tamoxifen  (NOLVADEX ) 20 MG tablet TAKE 1 TABLET BY  MOUTH EVERY DAY 90 tablet 1   potassium chloride  (KLOR-CON ) 20 MEQ packet TAKE 20 MEQ BY MOUTH 2 (TWO) TIMES DAILY. (Patient not taking: Reported on 05/28/2024) 180 packet 1   promethazine -dextromethorphan  (PROMETHAZINE -DM) 6.25-15 MG/5ML syrup Take 5 mLs by mouth 4 (four) times daily as needed for cough. (Patient not taking: Reported on 05/28/2024) 118 mL 0   No current facility-administered medications for this visit.   Facility-Administered Medications Ordered in Other Visits  Medication Dose Route Frequency Provider Last Rate Last Admin   sodium chloride  flush (NS) 0.9 % injection 10 mL  10 mL Intravenous PRN Shira Bobst R, MD   10 mL at 08/17/23 1525      .  PHYSICAL EXAMINATION: ECOG PERFORMANCE STATUS: 0 - Asymptomatic  Vitals:   05/28/24 0910  BP: (!) 116/51  Pulse: 76  Resp: 16  Temp: (!) 97.5  F (36.4 C)  SpO2: 99%      Filed Weights   05/28/24 0910  Weight: 224 lb 4.8 oz (101.7 kg)    Mild wheeze heard on the right side  Physical Exam Vitals and nursing note reviewed.  HENT:     Head: Normocephalic and atraumatic.     Mouth/Throat:     Pharynx: Oropharynx is clear.  Eyes:     Extraocular Movements: Extraocular movements intact.     Pupils: Pupils are equal, round, and reactive to light.  Cardiovascular:     Rate and Rhythm: Normal rate and regular rhythm.  Pulmonary:     Comments: Decreased breath sounds bilaterally.  Abdominal:     Palpations: Abdomen is soft.  Musculoskeletal:        General: Normal range of motion.     Cervical back: Normal range of motion.  Skin:    General: Skin is warm.  Neurological:     General: No focal deficit present.     Mental Status: She is alert and oriented to person, place, and time.  Psychiatric:        Behavior: Behavior normal.        Judgment: Judgment normal.      LABORATORY DATA:  I have reviewed the data as listed Lab Results  Component Value Date   WBC 4.6 05/28/2024   HGB 12.1 05/28/2024   HCT 36.2 05/28/2024   MCV 85.4 05/28/2024   PLT 207 05/28/2024   Recent Labs    04/26/24 0812 05/08/24 1239 05/28/24 0917  NA 142 141 140  K 4.4 3.8 3.7  CL 107 107 105  CO2 27 26 26   GLUCOSE 118* 104* 118*  BUN 13 13 12   CREATININE 0.83 0.76 0.70  CALCIUM  8.7* 9.1 9.2  GFRNONAA >60 >60 >60  PROT 6.5 6.8 6.9  ALBUMIN 3.3* 3.7 3.7  AST 28 34 38  ALT 25 29 29   ALKPHOS 54 59 59  BILITOT 0.5 0.3 0.4    RADIOGRAPHIC STUDIES: I have personally reviewed the radiological images as listed and agreed with the findings in the report. No results found.   ASSESSMENT & PLAN:   Carcinoma of upper-outer quadrant of right breast in female, estrogen receptor positive (HCC) # MAY 2023- STAGE III-T4N1 ER/PR positive HER2 POSITIVE RIGHT breast cancer; LN-positive-ER/PR positive her-2 NEGATIVE; # s/p neoadjuvant  chemotherapy- TCH plus P x 6 cycles- s/p Right mastectomy-[15th, NOV 2023] ypT2 [78mm]; ypN0.  Given partial response/involvement of the breast skin/multifocal disease-postmastectomy s/p RT- 08/27/2022. Patient currently on tamoxifen  [DEC-JAN 2024]; + OFS- Lupron  every 3 months ]. S/p  Ado-trastuzumab   emtansine x 14 cycles.  # JUNE 2nd, 2025- Stable examination without evidence of new or progressive disease in the chest, abdomen or pelvis;  Stable prominent mediastinal, mesenteric and retroperitoneal lymph nodes; Stable scattered pulmonary nodules. Monitor for now- will plan imaging in 4-6 months.   # Patient currently on adjuvant neratinib  160 mg /day tolerating with significant improvement of diarrhea/mucositis/abdominal cramping.  Continue 4 pills a day.  # URI- nasal- ear stuffiness- cough improved- recommend Claritin-D x1 week-   # MSK right upper quadrant-likely second to coughing-no concern for any acute pathology or malignancy.  If not improved recommend further imaging including x-rays.  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 # [facility] ortho re: right knee pain- stable.  # Hypocalcemia-  ON adjuvant Zometa  q 6 months x 3 years [first- May-17th, 2024].   Continue ca+Vit D BID. Vit D FEB 2024- 54; [albumin 3.0]-  Stable.  # Infusion reaction - ZOmeta  [flu like symtoms]- imprived with NSAIDs-  Stable.  # Diabetes- BG 118 [fasting]- on  Farxiga 10 mg- [OFF ozempic/metformin- intolerance]; on Farxiga-noted to have weight gain.  Defer to PCP- Stable.  # Hypokalemia: continue Kdur powder daily- Stable.  # PV access: port functional- # port/IV access-  flush q 6- 8 weeks for now.   Lupron  02/22/2024  q 63M; Zometa  [over 30 mins] q 6 M- #1 10/22/22 - last 9/17-2025   # DISPOSITION: # Flu shot today # Lupron  today #  follow up in 1 month- MD; labs- cbc/cmp; mag- Dr.B   All questions were answered. The patient/family knows to call the clinic with any problems, questions or  concerns.     Cindy JONELLE Joe, MD 05/28/2024 10:16 AM

## 2024-05-28 NOTE — Progress Notes (Signed)
 Pt states Nerlynx  hasn't come in yet. Suppose to be here today but not sure what to do if it doesn't come in. She only has pills left.  C/o having stuffy ears and nasal congestion. No fever. Cough is better.

## 2024-05-28 NOTE — Assessment & Plan Note (Addendum)
#   MAY 2023- STAGE III-T4N1 ER/PR positive HER2 POSITIVE RIGHT breast cancer; LN-positive-ER/PR positive her-2 NEGATIVE; # s/p neoadjuvant chemotherapy- TCH plus P x 6 cycles- s/p Right mastectomy-[15th, NOV 2023] ypT2 [52mm]; ypN0.  Given partial response/involvement of the breast skin/multifocal disease-postmastectomy s/p RT- 08/27/2022. Patient currently on tamoxifen  [DEC-JAN 2024]; + OFS- Lupron  every 3 months ]. S/p  Ado-trastuzumab emtansine  x 14 cycles.  # JUNE 2nd, 2025- Stable examination without evidence of new or progressive disease in the chest, abdomen or pelvis;  Stable prominent mediastinal, mesenteric and retroperitoneal lymph nodes; Stable scattered pulmonary nodules. Monitor for now- will plan imaging in 4-6 months.   # Patient currently on adjuvant neratinib  160 mg /day tolerating with significant improvement of diarrhea/mucositis/abdominal cramping.  Continue 4 pills a day.  # URI- nasal- ear stuffiness- cough improved- recommend Claritin-D x1 week-   # MSK right upper quadrant-likely second to coughing-no concern for any acute pathology or malignancy.  If not improved recommend further imaging including x-rays.  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 # [facility] ortho re: right knee pain- stable.  # Hypocalcemia-  ON adjuvant Zometa  q 6 months x 3 years [first- May-17th, 2024].   Continue ca+Vit D BID. Vit D FEB 2024- 54; [albumin 3.0]-  Stable.  # Infusion reaction - ZOmeta  [flu like symtoms]- imprived with NSAIDs-  Stable.  # Diabetes- BG 118 [fasting]- on  Farxiga 10 mg- [OFF ozempic/metformin- intolerance]; on Farxiga-noted to have weight gain.  Defer to PCP- Stable.  # Hypokalemia: continue Kdur powder daily- Stable.  # PV access: port functional- # port/IV access-  flush q 6- 8 weeks for now.   Lupron  02/22/2024  q 30M; Zometa  [over 30 mins] q 6 M- #1 10/22/22 - last 9/17-2025   # DISPOSITION: # Flu shot today # Lupron  today #  follow up in 1 month- MD; labs-  cbc/cmp; mag- Dr.B

## 2024-05-30 ENCOUNTER — Ambulatory Visit: Payer: Self-pay | Admitting: Internal Medicine

## 2024-05-30 DIAGNOSIS — C50411 Malignant neoplasm of upper-outer quadrant of right female breast: Secondary | ICD-10-CM

## 2024-06-03 NOTE — Addendum Note (Signed)
 Addended by: LAEL BROWNING A on: 06/03/2024 12:41 PM   Modules accepted: Orders

## 2024-06-06 ENCOUNTER — Other Ambulatory Visit: Payer: Self-pay | Admitting: Internal Medicine

## 2024-06-06 DIAGNOSIS — C50411 Malignant neoplasm of upper-outer quadrant of right female breast: Secondary | ICD-10-CM

## 2024-06-22 ENCOUNTER — Ambulatory Visit: Payer: 59 | Admitting: Plastic Surgery

## 2024-06-28 ENCOUNTER — Encounter: Payer: Self-pay | Admitting: Internal Medicine

## 2024-06-29 ENCOUNTER — Encounter: Payer: Self-pay | Admitting: Internal Medicine

## 2024-06-29 ENCOUNTER — Inpatient Hospital Stay: Attending: Internal Medicine

## 2024-06-29 DIAGNOSIS — C773 Secondary and unspecified malignant neoplasm of axilla and upper limb lymph nodes: Secondary | ICD-10-CM | POA: Diagnosis not present

## 2024-06-29 DIAGNOSIS — Z170 Estrogen receptor positive status [ER+]: Secondary | ICD-10-CM | POA: Insufficient documentation

## 2024-06-29 DIAGNOSIS — E876 Hypokalemia: Secondary | ICD-10-CM | POA: Diagnosis not present

## 2024-06-29 DIAGNOSIS — R109 Unspecified abdominal pain: Secondary | ICD-10-CM | POA: Insufficient documentation

## 2024-06-29 DIAGNOSIS — E1165 Type 2 diabetes mellitus with hyperglycemia: Secondary | ICD-10-CM | POA: Insufficient documentation

## 2024-06-29 DIAGNOSIS — K1230 Oral mucositis (ulcerative), unspecified: Secondary | ICD-10-CM | POA: Insufficient documentation

## 2024-06-29 DIAGNOSIS — R197 Diarrhea, unspecified: Secondary | ICD-10-CM | POA: Insufficient documentation

## 2024-06-29 DIAGNOSIS — C50411 Malignant neoplasm of upper-outer quadrant of right female breast: Secondary | ICD-10-CM | POA: Insufficient documentation

## 2024-06-29 DIAGNOSIS — Z79810 Long term (current) use of selective estrogen receptor modulators (SERMs): Secondary | ICD-10-CM | POA: Insufficient documentation

## 2024-06-29 LAB — CMP (CANCER CENTER ONLY)
ALT: 20 U/L (ref 0–44)
AST: 23 U/L (ref 15–41)
Albumin: 3.7 g/dL (ref 3.5–5.0)
Alkaline Phosphatase: 65 U/L (ref 38–126)
Anion gap: 12 (ref 5–15)
BUN: 18 mg/dL (ref 6–20)
CO2: 26 mmol/L (ref 22–32)
Calcium: 9.4 mg/dL (ref 8.9–10.3)
Chloride: 105 mmol/L (ref 98–111)
Creatinine: 0.8 mg/dL (ref 0.44–1.00)
GFR, Estimated: >60 mL/min
Glucose, Bld: 176 mg/dL — ABNORMAL HIGH (ref 70–99)
Potassium: 3.9 mmol/L (ref 3.5–5.1)
Sodium: 143 mmol/L (ref 135–145)
Total Bilirubin: 0.3 mg/dL (ref 0.0–1.2)
Total Protein: 6.7 g/dL (ref 6.5–8.1)

## 2024-06-29 LAB — CBC WITH DIFFERENTIAL (CANCER CENTER ONLY)
Abs Immature Granulocytes: 0.02 K/uL (ref 0.00–0.07)
Basophils Absolute: 0 K/uL (ref 0.0–0.1)
Basophils Relative: 1 %
Eosinophils Absolute: 0.1 K/uL (ref 0.0–0.5)
Eosinophils Relative: 2 %
HCT: 38.1 % (ref 36.0–46.0)
Hemoglobin: 12.6 g/dL (ref 12.0–15.0)
Immature Granulocytes: 0 %
Lymphocytes Relative: 29 %
Lymphs Abs: 1.4 K/uL (ref 0.7–4.0)
MCH: 28.8 pg (ref 26.0–34.0)
MCHC: 33.1 g/dL (ref 30.0–36.0)
MCV: 87.2 fL (ref 80.0–100.0)
Monocytes Absolute: 0.3 K/uL (ref 0.1–1.0)
Monocytes Relative: 7 %
Neutro Abs: 3.1 K/uL (ref 1.7–7.7)
Neutrophils Relative %: 61 %
Platelet Count: 155 K/uL (ref 150–400)
RBC: 4.37 MIL/uL (ref 3.87–5.11)
RDW: 13.8 % (ref 11.5–15.5)
WBC Count: 5 K/uL (ref 4.0–10.5)
nRBC: 0 % (ref 0.0–0.2)

## 2024-06-29 LAB — MAGNESIUM: Magnesium: 1.9 mg/dL (ref 1.7–2.4)

## 2024-06-29 LAB — VITAMIN D 25 HYDROXY (VIT D DEFICIENCY, FRACTURES): Vit D, 25-Hydroxy: 76.5 ng/mL (ref 30–100)

## 2024-06-29 NOTE — Progress Notes (Signed)
 Patient denies any new concerns at this time

## 2024-07-02 ENCOUNTER — Inpatient Hospital Stay: Admitting: Internal Medicine

## 2024-07-02 ENCOUNTER — Inpatient Hospital Stay

## 2024-07-02 DIAGNOSIS — Z170 Estrogen receptor positive status [ER+]: Secondary | ICD-10-CM

## 2024-07-02 DIAGNOSIS — C50411 Malignant neoplasm of upper-outer quadrant of right female breast: Secondary | ICD-10-CM

## 2024-07-02 NOTE — Progress Notes (Signed)
 I connected with Marilyn Page on 07/02/24 at  1:30 PM EST by video enabled telemedicine visit and verified that I am speaking with the correct person using two identifiers.  I discussed the limitations, risks, security and privacy concerns of performing an evaluation and management service by telemedicine and the availability of in-person appointments. I also discussed with the patient that there may be a patient responsible charge related to this service. The patient expressed understanding and agreed to proceed.    Other persons participating in the visit and their role in the encounter: RN/medical reconciliation Patients location: home Providers location: home  Oncology History Overview Note  MAY 2023-    Targeted ultrasound is performed, showing a 2.9 x 1.9 x 2.0 cm irregular hypoechoic mass right breast 10 o'clock position 6 cm from nipple at the site of palpable concern.   There is an abnormal 8 mm thickened right axillary lymph node.   There is skin thickening overlying the right breast. ------------------  A. BREAST, RIGHT AT 10:00, 6 CM FROM THE NIPPLE; ULTRASOUND-GUIDED CORE  NEEDLE BIOPSY:  - INVASIVE MAMMARY CARCINOMA, NO SPECIAL TYPE.   Size of invasive carcinoma: 11 mm in this sample  Histologic grade of invasive carcinoma: Grade 2                       Glandular/tubular differentiation score: 3                       Nuclear pleomorphism score: 2                       Mitotic rate score: 1                       Total score: 6  Ductal carcinoma in situ: Present, intermediate grade  Lymphovascular invasion: Not identified   B. LYMPH NODE, RIGHT AXILLARY; ULTRASOUND-GUIDED CORE NEEDLE BIOPSY:  - 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 IN GREATEST  EXTENT.   Comment:  The malignancy in the primary breast lesion appears morphologically  different from tumor in the lymph node sampling, with tumor present in  lymph node more suggestive of a histologic  grade 3, with significantly  increased mitotic activity and pleomorphism. Due to the discrepancy in  these morphologic patterns, ER/PR/HER2 immunohistochemistry will be  performed on both blocks A1 and B1, with reflex to FISH for HER2 2+. The  results will be reported in an addendum.   ADDENDUM:  CASE SUMMARY: BREAST BIOMARKER TESTS - A - BREAST, RIGHT AT 10:00  Estrogen Receptor (ER) Status: POSITIVE          Percentage of cells with nuclear positivity: 71-80%          Average intensity of staining: Strong   Progesterone Receptor (PgR) Status: POSITIVE          Percentage of cells with nuclear positivity: 81-90%          Average intensity of staining: Strong   HER2 (by immunohistochemistry): POSITIVE (Score 3+)       Percentage of cells with uniform intense complete membrane  staining: 61-70%  HER2 displays a heterogeneous staining pattern, with areas showing a  strong 3+ staining pattern, and other regions with complete absence (0+)  of staining.   Ki-67: Not performed   CASE SUMMARY: BREAST BIOMARKER TESTS - B - RIGHT AXILLARY LYMPH NODE  Estrogen Receptor (ER)  Status: POSITIVE          Percentage of cells with nuclear positivity: 81-90%          Average intensity of staining: Strong   Progesterone Receptor (PgR) Status: POSITIVE          Percentage of cells with nuclear positivity: 51-60%          Average intensity of staining: Strong   HER2 (by immunohistochemistry): NEGATIVE (Score 1+)  Ki-67: Not performed   #  MAY 2023- STAGE III-T4N1 ER/PR positive HER2 POSITIVE right breast cancer;LN-positive-ER/PR positive however 2 NEGATIVE s/p biopsy [see discussion below].  Discussed with Dr. Mabeline tumor heterogeneity. BREAST MRI- JUNE 2023- The patient's known malignancy in the upper outer quadrant of the right breast measures 3.8 x 2.8 x 3.6 cm. Numerous other suspicious masses are identified in the right breast as above involving the upper outer and upper inner quadrants,  located both anterior and posterior to the known malignancy.  Numerous enhancing foci in the skin as above are worrisome for skin metastases ; Bx proven. Biopsy proven metastatic node in the right axilla.  No evidence of malignancy in the left breast;  Two mildly prominent right internal mammary nodes were not FDG avid on recent PET-CT imaging.  Currently on neoadjuvant chemotherapy- TCH plus P x6 cycles;  MUGA scan May 31st, 2023- -61%.    # June 7th, 2023- NEO-ADJUVANT CHEMO- TCH+P x6 cycles- s/p Mastectomy [NOV 15th, 2023] [Dr.Byrnett]-  ypT2 [16mm]; ypN0.  # JAN mid, 2024 -TAMOXFEN 20 mg a day; ADD LupronFEB 2nd.2 2024 s/p RT maech, 22nd- 2024  # FEB 2nd, 2023- Kadcyla  q 3 W x14 cycles- finished dec 2024.   # OCT 17th, 2025- start NERATINIB  step up dose. November 20th-2025-decrease to 160 mg / 4 pills a day [diarrhea mucositis abdominal cramping]  # Lupron  q3 M [/start 2/07-2022]   # Genetic counseling s/p- NEG for any deleterious mutations.     Carcinoma of upper-outer quadrant of right breast in female, estrogen receptor positive (HCC)  10/26/2021 Initial Diagnosis   Carcinoma of upper-outer quadrant of right breast in female, estrogen receptor positive (HCC)   10/26/2021 Cancer Staging   Staging form: Breast, AJCC 8th Edition - Clinical: Stage IIIA (cT4d, cN1, cM0, G2, ER+, PR+, HER2+) - Signed by [name], MD on 06/04/2022 Histologic grading system: 3 grade system   11/10/2021 - 03/01/2022 Chemotherapy   Patient is on Treatment Plan : BREAST  Docetaxel  + Carboplatin  + Trastuzumab  + Pertuzumab   (TCHP) q21d      11/11/2021 - 01/13/2022 Chemotherapy   Patient is on Treatment Plan : BREAST  Docetaxel  + Carboplatin  + Trastuzumab  + Pertuzumab   (TCHP) q21d       Genetic Testing   Negative genetic testing. No pathogenic variants identified on the [facility] CancerNext-Expanded+RNA panel. The report date is 11/29/2021.  The CancerNext-Expanded + RNAinsight gene panel offered by [employer] and includes sequencing and rearrangement analysis for the following 77 genes: IP, ALK, APC*, ATM*, AXIN2, BAP1, BARD1, BLM, BMPR1A, BRCA1*, BRCA2*, BRIP1*, CDC73, CDH1*,CDK4, CDKN1B, CDKN2A, CHEK2*, CTNNA1, DICER1, FANCC, FH, FLCN, GALNT12, KIF1B, LZTR1, MAX, MEN1, MET, MLH1*, MSH2*, MSH3, MSH6*, MUTYH*, NBN, NF1*, NF2, NTHL1, PALB2*, PHOX2B, PMS2*, POT1, PRKAR1A, PTCH1, PTEN*, RAD51C*, RAD51D*,RB1, RECQL, RET, SDHA, SDHAF2, SDHB, SDHC, SDHD, SMAD4, SMARCA4, SMARCB1, SMARCE1, STK11, SUFU, TMEM127, TP53*,TSC1, TSC2, VHL and XRCC2 (sequencing and deletion/duplication); EGFR, EGLN1, HOXB13, KIT, MITF, PDGFRA, POLD1 and POLE (sequencing only); EPCAM and GREM1 (deletion/duplication only).   07/09/2022 -  Chemotherapy   Patient is on Treatment Plan : BREAST ADO-Trastuzumab Emtansine  (Kadcyla ) q21d        Chief Complaint: breast cancer    History of present illness:Floyce B Diskin 49 y.o.  female with history of triple positive breast cancer is here for follow-up.   Discussed the use of AI scribe software for clinical note transcription with the patient, who gave verbal consent to proceed.  History of Present Illness   MARIEELENA BARTKO is a 49 year old female with triple positive (ER+, PR+, HER2+) right breast cancer on neratinib  who presents for routine oncology follow-up.  She continues neratinib  therapy at four tablets daily and experiences intermittent diarrhea, which is manageable and appears to be influenced by dietary intake, particularly at supper. She reports occasional abdominal pain associated with diarrhea but has required only a single dose of loperamide  or diphenoxylate-atropine since her last visit.  Recent laboratory studies showed normal leukocyte count, hemoglobin, platelet count, and potassium (3.9 mmol/L). Blood glucose was elevated at 176 mg/dL, which she attributes to non-fasting status. She denies new symptoms or concerns and reports overall well-being.  She received  influenza vaccination and her most recent leuprolide  injection prior to this visit.      Observation/objective: Alert & oriented x 3. In No acute distress.   Assessment and plan: Carcinoma of upper-outer quadrant of right breast in female, estrogen receptor positive (HCC) # MAY 2023- STAGE III-T4N1 ER/PR positive HER2 POSITIVE RIGHT breast cancer; LN-positive-ER/PR positive her-2 NEGATIVE; # s/p neoadjuvant chemotherapy- TCH plus P x 6 cycles- s/p Right mastectomy-[15th, NOV 2023] ypT2 [45mm]; ypN0.  Given partial response/involvement of the breast skin/multifocal disease-postmastectomy s/p RT- 08/27/2022. Patient currently on tamoxifen  [DEC-JAN 2024]; + OFS- Lupron  every 3 months ]. S/p  Ado-trastuzumab emtansine  x 14 cycles.  # JUNE 2nd, 2025- Stable examination without evidence of new or progressive disease in the chest, abdomen or pelvis;  Stable prominent mediastinal, mesenteric and retroperitoneal lymph nodes; Stable scattered pulmonary nodules. Monitor for now- will plan imaging at next visit.   # Patient currently on adjuvant neratinib  160 mg /day tolerating with significant improvement of diarrhea/mucositis/abdominal cramping.  Continue 4 pills a day.  # MSK right upper quadrant-likely second to coughing-no concern for any acute pathology or malignancy- stable.   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 # Emerge ortho re: right knee pain- stable.  # Hypocalcemia-  ON adjuvant Zometa  q 6 months x 3 years [first- May-17th, 2024].   Continue ca+Vit D BID. Vit D FEB 2024- 54; [albumin 3.0]-  Stable.  # Infusion reaction - ZOmeta  [flu like symtoms]- imprived with NSAIDs-  Stable.  # Diabetes-  on  Farxiga 10 mg- [OFF ozempic/metformin- intolerance]; on Farxiga-noted to have weight gain.  Defer to PCP- Stable.  # Hypokalemia: continue Kdur powder daily- Stable.  # PV access: port functional- # port/IV access-  flush q 6- 8 weeks for now.   Lupron  05/28/2024  q 32M; Zometa  [over 30  mins] q 6 M- #1 10/22/22 - last 9/17-2025   # DISPOSITION: #  follow up in 2  month- MD; labs- cbc/cmp; mag-Lupron ; Zometa  [30 mins] Dr.B   Follow-up instructions:  I discussed the assessment and treatment plan with the patient.  The patient was provided an opportunity to ask questions and all were answered.  The patient agreed with the plan and demonstrated understanding of instructions.  The patient was advised to call back or seek an in person evaluation if the symptoms worsen or if the  condition fails to improve as anticipated.    Dr. Faith Branan [facility] 07/02/2024 1:41 PM

## 2024-07-02 NOTE — Assessment & Plan Note (Addendum)
#   MAY 2023- STAGE III-T4N1 ER/PR positive HER2 POSITIVE RIGHT breast cancer; LN-positive-ER/PR positive her-2 NEGATIVE; # s/p neoadjuvant chemotherapy- TCH plus P x 6 cycles- s/p Right mastectomy-[15th, NOV 2023] ypT2 [38mm]; ypN0.  Given partial response/involvement of the breast skin/multifocal disease-postmastectomy s/p RT- 08/27/2022. Patient currently on tamoxifen  [DEC-JAN 2024]; + OFS- Lupron  every 3 months ]. S/p  Ado-trastuzumab emtansine  x 14 cycles.  # JUNE 2nd, 2025- Stable examination without evidence of new or progressive disease in the chest, abdomen or pelvis;  Stable prominent mediastinal, mesenteric and retroperitoneal lymph nodes; Stable scattered pulmonary nodules. Monitor for now- will plan imaging at next visit.   # Patient currently on adjuvant neratinib  160 mg /day tolerating with significant improvement of diarrhea/mucositis/abdominal cramping.  Continue 4 pills a day.  # MSK right upper quadrant-likely second to coughing-no concern for any acute pathology or malignancy- stable.   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 # Emerge ortho re: right knee pain- stable.  # Hypocalcemia-  ON adjuvant Zometa  q 6 months x 3 years [first- May-17th, 2024].   Continue ca+Vit D BID. Vit D FEB 2024- 54; [albumin 3.0]-  Stable.  # Infusion reaction - ZOmeta  [flu like symtoms]- imprived with NSAIDs-  Stable.  # Diabetes-  on  Farxiga 10 mg- [OFF ozempic/metformin- intolerance]; on Farxiga-noted to have weight gain.  Defer to PCP- Stable.  # Hypokalemia: continue Kdur powder daily- Stable.  # PV access: port functional- # port/IV access-  flush q 6- 8 weeks for now.   Lupron  05/28/2024  q 31M; Zometa  [over 30 mins] q 6 M- #1 10/22/22 - last 9/17-2025   # DISPOSITION: #  follow up in 2  month- MD; labs- cbc/cmp; mag-Lupron ; Zometa  [30 mins] Dr.B

## 2024-07-03 NOTE — Addendum Note (Signed)
 Addended by: LAEL BROWNING A on: 07/03/2024 11:02 AM   Modules accepted: Orders

## 2024-07-06 ENCOUNTER — Other Ambulatory Visit: Payer: Self-pay | Admitting: Internal Medicine

## 2024-07-06 DIAGNOSIS — C50411 Malignant neoplasm of upper-outer quadrant of right female breast: Secondary | ICD-10-CM

## 2024-07-16 ENCOUNTER — Ambulatory Visit: Admitting: Plastic Surgery

## 2024-08-31 ENCOUNTER — Inpatient Hospital Stay: Admitting: Internal Medicine

## 2024-08-31 ENCOUNTER — Inpatient Hospital Stay

## 2024-10-17 ENCOUNTER — Ambulatory Visit: Admitting: Radiation Oncology
